# Patient Record
Sex: Female | Born: 1940 | Race: White | Hispanic: No | Smoking: Current every day smoker
Health system: Southern US, Community
[De-identification: ages and names within clinical notes are randomized; demographics above are authoritative.]

## PROBLEM LIST (undated history)

## (undated) DIAGNOSIS — I272 Pulmonary hypertension, unspecified: Secondary | ICD-10-CM

## (undated) DIAGNOSIS — G2581 Restless legs syndrome: Secondary | ICD-10-CM

## (undated) DIAGNOSIS — E871 Hypo-osmolality and hyponatremia: Secondary | ICD-10-CM

## (undated) DIAGNOSIS — I350 Nonrheumatic aortic (valve) stenosis: Secondary | ICD-10-CM

## (undated) DIAGNOSIS — I10 Essential (primary) hypertension: Secondary | ICD-10-CM

## (undated) DIAGNOSIS — A419 Sepsis, unspecified organism: Secondary | ICD-10-CM

## (undated) DIAGNOSIS — R6521 Severe sepsis with septic shock: Secondary | ICD-10-CM

## (undated) DIAGNOSIS — I472 Ventricular tachycardia: Secondary | ICD-10-CM

## (undated) DIAGNOSIS — J449 Chronic obstructive pulmonary disease, unspecified: Secondary | ICD-10-CM

## (undated) DIAGNOSIS — I5043 Acute on chronic combined systolic (congestive) and diastolic (congestive) heart failure: Secondary | ICD-10-CM

## (undated) DIAGNOSIS — R000 Tachycardia, unspecified: Secondary | ICD-10-CM

## (undated) DIAGNOSIS — K5732 Diverticulitis of large intestine without perforation or abscess without bleeding: Secondary | ICD-10-CM

## (undated) DIAGNOSIS — J9690 Respiratory failure, unspecified, unspecified whether with hypoxia or hypercapnia: Secondary | ICD-10-CM

## (undated) DIAGNOSIS — R931 Abnormal findings on diagnostic imaging of heart and coronary circulation: Secondary | ICD-10-CM

## (undated) DIAGNOSIS — K279 Peptic ulcer, site unspecified, unspecified as acute or chronic, without hemorrhage or perforation: Secondary | ICD-10-CM

## (undated) DIAGNOSIS — I447 Left bundle-branch block, unspecified: Secondary | ICD-10-CM

## (undated) DIAGNOSIS — I340 Nonrheumatic mitral (valve) insufficiency: Secondary | ICD-10-CM

## (undated) DIAGNOSIS — Z720 Tobacco use: Secondary | ICD-10-CM

## (undated) DIAGNOSIS — I5032 Chronic diastolic (congestive) heart failure: Secondary | ICD-10-CM

## (undated) DIAGNOSIS — M25559 Pain in unspecified hip: Secondary | ICD-10-CM

## (undated) HISTORY — DX: Restless legs syndrome: G25.81

## (undated) HISTORY — DX: Pain in unspecified hip: M25.559

## (undated) HISTORY — DX: Tobacco use: Z72.0

## (undated) HISTORY — DX: Essential (primary) hypertension: I10

## (undated) HISTORY — PX: TONSILLECTOMY: SHX5217

## (undated) HISTORY — PX: ABDOMINAL HYSTERECTOMY: SHX81

## (undated) HISTORY — PX: DILATION AND CURETTAGE OF UTERUS: SHX78

---

## 1998-08-13 ENCOUNTER — Ambulatory Visit (HOSPITAL_COMMUNITY): Admission: RE | Admit: 1998-08-13 | Discharge: 1998-08-13 | Payer: Self-pay | Admitting: *Deleted

## 1999-07-20 ENCOUNTER — Other Ambulatory Visit: Admission: RE | Admit: 1999-07-20 | Discharge: 1999-07-20 | Payer: Self-pay | Admitting: Internal Medicine

## 1999-10-31 ENCOUNTER — Emergency Department (HOSPITAL_COMMUNITY): Admission: EM | Admit: 1999-10-31 | Discharge: 1999-10-31 | Payer: Self-pay | Admitting: Emergency Medicine

## 1999-10-31 ENCOUNTER — Encounter: Payer: Self-pay | Admitting: Emergency Medicine

## 2000-04-06 ENCOUNTER — Encounter: Payer: Self-pay | Admitting: Internal Medicine

## 2000-04-06 ENCOUNTER — Encounter: Admission: RE | Admit: 2000-04-06 | Discharge: 2000-04-06 | Payer: Self-pay | Admitting: Internal Medicine

## 2002-05-03 ENCOUNTER — Encounter: Payer: Self-pay | Admitting: Internal Medicine

## 2002-05-03 ENCOUNTER — Encounter: Admission: RE | Admit: 2002-05-03 | Discharge: 2002-05-03 | Payer: Self-pay | Admitting: Internal Medicine

## 2004-05-11 ENCOUNTER — Ambulatory Visit: Payer: Self-pay | Admitting: Internal Medicine

## 2004-10-22 ENCOUNTER — Ambulatory Visit: Payer: Self-pay | Admitting: Internal Medicine

## 2004-10-29 ENCOUNTER — Ambulatory Visit: Payer: Self-pay | Admitting: Internal Medicine

## 2004-12-08 ENCOUNTER — Ambulatory Visit: Payer: Self-pay | Admitting: Internal Medicine

## 2005-04-28 ENCOUNTER — Ambulatory Visit: Payer: Self-pay | Admitting: Internal Medicine

## 2005-12-15 ENCOUNTER — Ambulatory Visit: Payer: Self-pay | Admitting: Internal Medicine

## 2005-12-23 ENCOUNTER — Ambulatory Visit: Payer: Self-pay | Admitting: Internal Medicine

## 2007-02-09 DIAGNOSIS — I10 Essential (primary) hypertension: Secondary | ICD-10-CM

## 2007-03-05 ENCOUNTER — Telehealth (INDEPENDENT_AMBULATORY_CARE_PROVIDER_SITE_OTHER): Payer: Self-pay | Admitting: *Deleted

## 2007-03-06 ENCOUNTER — Ambulatory Visit: Payer: Self-pay | Admitting: Internal Medicine

## 2007-03-06 DIAGNOSIS — M25559 Pain in unspecified hip: Secondary | ICD-10-CM

## 2007-03-07 ENCOUNTER — Telehealth: Payer: Self-pay | Admitting: Internal Medicine

## 2007-04-02 ENCOUNTER — Telehealth: Payer: Self-pay | Admitting: Internal Medicine

## 2007-04-03 ENCOUNTER — Ambulatory Visit: Payer: Self-pay | Admitting: Internal Medicine

## 2007-05-21 ENCOUNTER — Ambulatory Visit: Payer: Self-pay | Admitting: Internal Medicine

## 2007-05-21 DIAGNOSIS — R197 Diarrhea, unspecified: Secondary | ICD-10-CM

## 2007-05-25 ENCOUNTER — Telehealth: Payer: Self-pay | Admitting: Internal Medicine

## 2007-05-28 ENCOUNTER — Ambulatory Visit: Payer: Self-pay | Admitting: Internal Medicine

## 2007-05-28 LAB — CONVERTED CEMR LAB
BUN: 4 mg/dL — ABNORMAL LOW (ref 6–23)
Basophils Absolute: 0.1 10*3/uL (ref 0.0–0.1)
Basophils Relative: 0.6 % (ref 0.0–1.0)
CO2: 31 meq/L (ref 19–32)
Calcium: 9.4 mg/dL (ref 8.4–10.5)
Chloride: 90 meq/L — ABNORMAL LOW (ref 96–112)
Creatinine, Ser: 0.7 mg/dL (ref 0.4–1.2)
Eosinophils Absolute: 0 10*3/uL (ref 0.0–0.6)
Eosinophils Relative: 0.4 % (ref 0.0–5.0)
GFR calc Af Amer: 107 mL/min
GFR calc non Af Amer: 88 mL/min
Glucose, Bld: 99 mg/dL (ref 70–99)
HCT: 41.4 % (ref 36.0–46.0)
Hemoglobin: 14.3 g/dL (ref 12.0–15.0)
Lymphocytes Relative: 15.3 % (ref 12.0–46.0)
MCHC: 34.6 g/dL (ref 30.0–36.0)
MCV: 97.5 fL (ref 78.0–100.0)
Monocytes Absolute: 0.5 10*3/uL (ref 0.2–0.7)
Monocytes Relative: 4.4 % (ref 3.0–11.0)
Neutro Abs: 8.4 10*3/uL — ABNORMAL HIGH (ref 1.4–7.7)
Neutrophils Relative %: 79.3 % — ABNORMAL HIGH (ref 43.0–77.0)
Platelets: 615 10*3/uL — ABNORMAL HIGH (ref 150–400)
Potassium: 3.3 meq/L — ABNORMAL LOW (ref 3.5–5.1)
RBC: 4.25 M/uL (ref 3.87–5.11)
RDW: 12.4 % (ref 11.5–14.6)
Sodium: 134 meq/L — ABNORMAL LOW (ref 135–145)
WBC: 10.6 10*3/uL — ABNORMAL HIGH (ref 4.5–10.5)

## 2007-05-31 ENCOUNTER — Telehealth: Payer: Self-pay | Admitting: Internal Medicine

## 2008-07-24 ENCOUNTER — Ambulatory Visit: Payer: Self-pay | Admitting: Internal Medicine

## 2010-01-25 ENCOUNTER — Telehealth: Payer: Self-pay | Admitting: *Deleted

## 2010-02-15 ENCOUNTER — Ambulatory Visit: Payer: Self-pay | Admitting: Internal Medicine

## 2010-02-24 ENCOUNTER — Telehealth: Payer: Self-pay | Admitting: Internal Medicine

## 2010-07-20 NOTE — Assessment & Plan Note (Signed)
Summary: SINUSITIS / BRONCHITIS? // RS   Vital Signs:  Patient profile:   70 year old female Weight:      138 pounds O2 Sat:      89 % on Room air Temp:     98.5 degrees F oral BP sitting:   136 / 80  (right arm) Cuff size:   regular  Vitals Entered By: Duard Brady LPN (February 15, 2010 4:32 PM)  O2 Flow:  Room air CC: c/o head and chest congestion , productive cough , using OTC Is Patient Diabetic? No   CC:  c/o head and chest congestion , productive cough , and using OTC.  History of Present Illness: 70 year old patient has a history of ongoing tobacco use, hypertension, who presents with a two week history of increasing chest congestion, and minimally productive cough.  There's been no fever or chills.  She has been hostile eyes in the remote past due to pneumonia.  Denies much in the way of shortness of breath.  Also describes some head congestion.  Preventive Screening-Counseling & Management  Alcohol-Tobacco     Smoking Cessation Counseling: yes  Allergies: 1)  ! Penicillin 2)  ! Erythromycin 3)  ! Prednisone 4)  ! * Tetanus 5)  ! * Mobic  Past History:  Past Medical History: Reviewed history from 02/09/2007 and no changes required. Hypertension Tobacco abuse Restless leg syndrome  Past Surgical History: Reviewed history from 02/09/2007 and no changes required. Hysterectomy Tonsillectomy D&C  Family History: Reviewed history from 03/06/2007 and no changes required. Family History Other cancer-cervical Fam hx MI  father died age 41 leukemia and mother died age 63 of cervical cancer one brother and one sister positive for a coronary artery disease  Review of Systems       The patient complains of prolonged cough.  The patient denies anorexia, fever, weight loss, weight gain, vision loss, decreased hearing, hoarseness, chest pain, syncope, dyspnea on exertion, peripheral edema, headaches, hemoptysis, abdominal pain, melena, hematochezia, severe  indigestion/heartburn, hematuria, incontinence, genital sores, muscle weakness, suspicious skin lesions, transient blindness, difficulty walking, depression, unusual weight change, abnormal bleeding, enlarged lymph nodes, angioedema, and breast masses.    Physical Exam  General:  Well-developed,well-nourished,in no acute distress; alert,appropriate and cooperative throughout examination; no acute distress.  Blood pressure normal 130/80 Head:  Normocephalic and atraumatic without obvious abnormalities. No apparent alopecia or balding. Eyes:  No corneal or conjunctival inflammation noted. EOMI. Perrla. Funduscopic exam benign, without hemorrhages, exudates or papilledema. Vision grossly normal. Ears:  External ear exam shows no significant lesions or deformities.  Otoscopic examination reveals clear canals, tympanic membranes are intact bilaterally without bulging, retraction, inflammation or discharge. Hearing is grossly normal bilaterally. Mouth:  Oral mucosa and oropharynx without lesions or exudates.  Teeth in good repair. Neck:  No deformities, masses, or tenderness noted. Lungs:  scattered rhonchi O2 saturation 91 to 93%normal respiratory effort, no intercostal retractions, and no accessory muscle use.  normal respiratory effort, no intercostal retractions, and no accessory muscle use.   Heart:  Normal rate and regular rhythm. S1 and S2 normal without gallop, murmur, click, rub or other extra sounds. Abdomen:  Bowel sounds positive,abdomen soft and non-tender without masses, organomegaly or hernias noted. Pulses:  right dorsalis pedis pulse diminished Extremities:  no edema   Impression & Recommendations:  Problem # 1:  BRONCHITIS-ACUTE (ICD-466.0)  Her updated medication list for this problem includes:    Avelox 400 Mg Tabs (Moxifloxacin hcl) ..... One daily  Orders: Depo-  Medrol 80mg  (J1040) Admin of Therapeutic Inj  intramuscular or subcutaneous (14782)  Problem # 2:  HYPERTENSION  (ICD-401.9)  Her updated medication list for this problem includes:    Amlodipine Besylate 5 Mg Tabs (Amlodipine besylate) .Marland Kitchen... 1 once daily    Hyzaar 50-12.5 Mg Tabs (Losartan potassium-hctz) .Marland Kitchen... 1 once daily  Her updated medication list for this problem includes:    Amlodipine Besylate 5 Mg Tabs (Amlodipine besylate) .Marland Kitchen... 1 once daily    Hyzaar 50-12.5 Mg Tabs (Losartan potassium-hctz) .Marland Kitchen... 1 once daily  Complete Medication List: 1)  Amlodipine Besylate 5 Mg Tabs (Amlodipine besylate) .Marland Kitchen.. 1 once daily 2)  Hyzaar 50-12.5 Mg Tabs (Losartan potassium-hctz) .Marland Kitchen.. 1 once daily 3)  Clonazepam Odt 0.5 Mg Tbdp (Clonazepam) .Marland Kitchen.. 1 at bedtime 4)  Avelox 400 Mg Tabs (Moxifloxacin hcl) .... One daily  Patient Instructions: 1)  Tobacco is very bad for your health and your loved ones! You Should stop smoking!. 2)  Mucinex one twice daily 3)  Take your antibiotic as prescribed until ALL of it is gone, but stop if you develop a rash or swelling and contact our office as soon as possible. 4)  Dulera- one puff twice daily  5)  Report to hospital for admission if any clinical worsening 6)  Please schedule a follow-up appointment in 4 days Prescriptions: CLONAZEPAM ODT 0.5 MG  TBDP (CLONAZEPAM) 1 at bedtime  #90 Tablet x 1   Entered and Authorized by:   Gordy Savers  MD   Signed by:   Gordy Savers  MD on 02/15/2010   Method used:   Print then Give to Patient   RxID:   [id number] HYZAAR 50-12.5 MG  TABS (LOSARTAN POTASSIUM-HCTZ) 1 once daily  #90 x 6   Entered and Authorized by:   Gordy Savers  MD   Signed by:   Gordy Savers  MD on 02/15/2010   Method used:   Print then Give to Patient   RxID:   [id number] AMLODIPINE BESYLATE 5 MG  TABS (AMLODIPINE BESYLATE) 1 once daily  #90 x 0   Entered and Authorized by:   Gordy Savers  MD   Signed by:   Gordy Savers  MD on 02/15/2010   Method used:   Print then Give to Patient   RxID:    [id number] AVELOX 400 MG TABS (MOXIFLOXACIN HCL) one daily  #7 x 0   Entered and Authorized by:   Gordy Savers  MD   Signed by:   Gordy Savers  MD on 02/15/2010   Method used:   Print then Give to Patient   RxID:   [id number]    Medication Administration  Injection # 1:    Medication: Depo- Medrol 80mg     Diagnosis: BRONCHITIS-ACUTE (ICD-466.0)    Route: IM    Site: RUOQ gluteus    Exp Date: 09/2012    Lot #: [id number]    Mfr: Pharmacia    Patient tolerated injection without complications    Given by: Duard Brady LPN (February 15, 2010 5:20 PM)  Orders Added: 1)  Est. Patient Level III [16606] 2)  Depo- Medrol 80mg  [J1040] 3)  Admin of Therapeutic Inj  intramuscular or subcutaneous [30160]

## 2010-07-20 NOTE — Progress Notes (Signed)
Summary: refill hyzaar  Phone Note Refill Request Message from:  Fax from Pharmacy on February 24, 2010 12:24 PM  Refills Requested: Medication #1:  HYZAAR 50-12.5 MG  TABS 1 once daily [facility]    Method Requested: Fax to Local Pharmacy Initial call taken by: Duard Brady LPN,  February 24, 2010 12:25 PM    Prescriptions: HYZAAR 50-12.5 MG  TABS (LOSARTAN POTASSIUM-HCTZ) 1 once daily  #90 x 6   Entered by:   Duard Brady LPN   Authorized by:   Gordy Savers  MD   Signed by:   Duard Brady LPN on 81/19/1478   Method used:   Faxed to ...       [facility] (retail)       [address]       [place], [place]  [id number]       Ph: [phone]       Fax: [phone]   RxID:   [id number]

## 2010-07-20 NOTE — Progress Notes (Signed)
Summary: refill  Phone Note From Pharmacy   Caller: [facility]  Reason for Call: Needs renewal Details for Reason: Amlodipine 5mg  Initial call taken by: Romualdo Bolk, CMA Duncan Dull),  January 25, 2010 11:36 AM  Follow-up for Phone Call        Rx sent to pharmacy Follow-up by: Romualdo Bolk, CMA Duncan Dull),  January 25, 2010 11:36 AM    Prescriptions: AMLODIPINE BESYLATE 5 MG  TABS (AMLODIPINE BESYLATE) 1 once daily  #90 x 0   Entered by:   Romualdo Bolk, CMA (AAMA)   Authorized by:   Gordy Savers  MD   Signed by:   Romualdo Bolk, CMA (AAMA) on 01/25/2010   Method used:   Electronically to        [facility] (retail)       [address]       [place], [place]  [address]       Ph: [phone]       Fax: [phone]   RxID:   [id number]

## 2010-07-30 ENCOUNTER — Other Ambulatory Visit: Payer: Self-pay

## 2010-07-30 DIAGNOSIS — I10 Essential (primary) hypertension: Secondary | ICD-10-CM

## 2010-07-30 MED ORDER — AMLODIPINE BESYLATE 5 MG PO TABS: 5.0000 mg | ORAL_TABLET | Freq: Every day | ORAL | Status: DC

## 2010-07-30 NOTE — Telephone Encounter (Signed)
Faxed back to [facility]

## 2010-11-04 ENCOUNTER — Other Ambulatory Visit (INDEPENDENT_AMBULATORY_CARE_PROVIDER_SITE_OTHER): Payer: BC Managed Care – PPO | Admitting: Internal Medicine

## 2010-11-04 ENCOUNTER — Encounter: Payer: Self-pay | Admitting: Internal Medicine

## 2010-11-04 ENCOUNTER — Other Ambulatory Visit: Payer: Self-pay

## 2010-11-04 DIAGNOSIS — Z0000 Encounter for general adult medical examination without abnormal findings: Secondary | ICD-10-CM

## 2010-11-04 LAB — HEPATIC FUNCTION PANEL
ALT: 13 U/L (ref 0–35)
AST: 17 U/L (ref 0–37)
Albumin: 3.6 g/dL (ref 3.5–5.2)
Alkaline Phosphatase: 68 U/L (ref 39–117)
Bilirubin, Direct: 0.1 mg/dL (ref 0.0–0.3)
Total Bilirubin: 0.6 mg/dL (ref 0.3–1.2)
Total Protein: 6.5 g/dL (ref 6.0–8.3)

## 2010-11-04 LAB — CBC WITH DIFFERENTIAL/PLATELET
Basophils Absolute: 0 10*3/uL (ref 0.0–0.1)
Basophils Relative: 0.5 % (ref 0.0–3.0)
Eosinophils Absolute: 0.1 10*3/uL (ref 0.0–0.7)
Eosinophils Relative: 1.1 % (ref 0.0–5.0)
HCT: 45.6 % (ref 36.0–46.0)
Hemoglobin: 15.6 g/dL — ABNORMAL HIGH (ref 12.0–15.0)
Lymphocytes Relative: 27.9 % (ref 12.0–46.0)
Lymphs Abs: 2.1 10*3/uL (ref 0.7–4.0)
MCHC: 34.3 g/dL (ref 30.0–36.0)
MCV: 98.8 fl (ref 78.0–100.0)
Monocytes Absolute: 0.6 10*3/uL (ref 0.1–1.0)
Monocytes Relative: 7.8 % (ref 3.0–12.0)
Neutro Abs: 4.7 10*3/uL (ref 1.4–7.7)
Neutrophils Relative %: 62.7 % (ref 43.0–77.0)
Platelets: 346 10*3/uL (ref 150.0–400.0)
RBC: 4.61 Mil/uL (ref 3.87–5.11)
RDW: 13.9 % (ref 11.5–14.6)
WBC: 7.6 10*3/uL (ref 4.5–10.5)

## 2010-11-04 LAB — LIPID PANEL
Cholesterol: 166 mg/dL (ref 0–200)
HDL: 71.7 mg/dL (ref >39.00)
LDL Cholesterol: 73 mg/dL (ref 0–99)
Total CHOL/HDL Ratio: 2
Triglycerides: 106 mg/dL (ref 0.0–149.0)
VLDL: 21.2 mg/dL (ref 0.0–40.0)

## 2010-11-04 LAB — BASIC METABOLIC PANEL
BUN: 10 mg/dL (ref 6–23)
CO2: 30 mEq/L (ref 19–32)
Calcium: 9.5 mg/dL (ref 8.4–10.5)
Chloride: 95 mEq/L — ABNORMAL LOW (ref 96–112)
Creatinine, Ser: 0.5 mg/dL (ref 0.4–1.2)
GFR: 141.93 mL/min (ref >60.00)
Glucose, Bld: 83 mg/dL (ref 70–99)
Potassium: 4 mEq/L (ref 3.5–5.1)
Sodium: 134 mEq/L — ABNORMAL LOW (ref 135–145)

## 2010-11-04 LAB — POCT URINALYSIS DIPSTICK
Glucose, UA: NEGATIVE
Nitrite, UA: NEGATIVE
Spec Grav, UA: 1.015
Urobilinogen, UA: 0.2

## 2010-11-04 LAB — TSH: TSH: 0.77 u[IU]/mL (ref 0.35–5.50)

## 2010-11-09 ENCOUNTER — Ambulatory Visit (INDEPENDENT_AMBULATORY_CARE_PROVIDER_SITE_OTHER): Payer: BC Managed Care – PPO | Admitting: Internal Medicine

## 2010-11-09 ENCOUNTER — Encounter: Payer: Self-pay | Admitting: Internal Medicine

## 2010-11-09 VITALS — BP 140/90 | HR 88 | Temp 98.1°F | Resp 18 | Ht 62.0 in | Wt 136.0 lb

## 2010-11-09 DIAGNOSIS — I10 Essential (primary) hypertension: Secondary | ICD-10-CM

## 2010-11-09 DIAGNOSIS — R0989 Other specified symptoms and signs involving the circulatory and respiratory systems: Secondary | ICD-10-CM

## 2010-11-09 MED ORDER — AMLODIPINE BESYLATE 5 MG PO TABS: 5.0000 mg | ORAL_TABLET | Freq: Every day | ORAL | Status: DC

## 2010-11-09 MED ORDER — LOSARTAN POTASSIUM-HCTZ 50-12.5 MG PO TABS: 1.0000 | ORAL_TABLET | Freq: Every day | ORAL | Status: DC

## 2010-11-09 MED ORDER — TRAMADOL HCL 50 MG PO TABS: 50.0000 mg | ORAL_TABLET | Freq: Four times a day (QID) | ORAL | Status: DC | PRN

## 2010-11-09 MED ORDER — SERTRALINE HCL 50 MG PO TABS: 50.0000 mg | ORAL_TABLET | Freq: Every day | ORAL | Status: DC

## 2010-11-09 MED ORDER — CLONAZEPAM 0.5 MG PO TABS: 0.5000 mg | ORAL_TABLET | Freq: Every day | ORAL | Status: DC

## 2010-11-09 NOTE — Progress Notes (Signed)
  Subjective:    Patient ID: Jill White, female    DOB: 28-Nov-1940, 70 y.o.   MRN: [id number]  HPI  70 year old patient who is seen today for a wellness exam. She has a history of treated hypertension of long-standing and a history of ongoing tobacco use. She feels well today. She declines screening colonoscopy and also denies gynecologic check. She will obtain a screening mammogram. She does have some arthritic symptoms.  Review of Systems  Constitutional: Negative for fever, appetite change, fatigue and unexpected weight change.  HENT: Negative for hearing loss, ear pain, nosebleeds, congestion, sore throat, mouth sores, trouble swallowing, neck stiffness, dental problem, voice change, sinus pressure and tinnitus.   Eyes: Negative for photophobia, pain, redness and visual disturbance.  Respiratory: Negative for cough, chest tightness and shortness of breath.   Cardiovascular: Negative for chest pain, palpitations and leg swelling.  Gastrointestinal: Negative for nausea, vomiting, abdominal pain, diarrhea, constipation, blood in stool, abdominal distention and rectal pain.  Genitourinary: Negative for dysuria, urgency, frequency, hematuria, flank pain, vaginal bleeding, vaginal discharge, difficulty urinating, genital sores, vaginal pain, menstrual problem and pelvic pain.  Musculoskeletal: Positive for arthralgias. Negative for back pain.  Skin: Negative for rash.  Neurological: Negative for dizziness, syncope, speech difficulty, weakness, light-headedness, numbness and headaches.  Hematological: Negative for adenopathy. Does not bruise/bleed easily.  Psychiatric/Behavioral: Negative for suicidal ideas, behavioral problems, self-injury, dysphoric mood and agitation. The patient is not nervous/anxious.        Objective:   Physical Exam  Constitutional: She is oriented to person, place, and time. She appears well-developed and well-nourished.       134/70  HENT:  Head: Normocephalic and  atraumatic.  Right Ear: External ear normal.  Left Ear: External ear normal.  Mouth/Throat: Oropharynx is clear and moist.  Eyes: Conjunctivae and EOM are normal.  Neck: Normal range of motion. Neck supple. No JVD present. No thyromegaly present.       Left supraclavicular bruit  Cardiovascular: Normal rate, regular rhythm, normal heart sounds and intact distal pulses.   No murmur heard. Pulmonary/Chest: Effort normal and breath sounds normal. She has no wheezes. She has no rales.  Abdominal: Soft. Bowel sounds are normal. She exhibits no distension and no mass. There is no tenderness. There is no rebound and no guarding.  Musculoskeletal: Normal range of motion. She exhibits no edema and no tenderness.  Neurological: She is alert and oriented to person, place, and time. She has normal reflexes. No cranial nerve deficit. She exhibits normal muscle tone. Coordination normal.  Skin: Skin is warm and dry. No rash noted.  Psychiatric: She has a normal mood and affect. Her behavior is normal.          Assessment & Plan:   Annual health exam Hypertension well controlled Osteoarthritis Ongoing tobacco use  Mammogram colonoscopy bone density study all discussed and encouraged Tobacco cessation encouraged Aspirin use encouraged We'll consider a carotid artery Doppler study Recheck in 6 months

## 2010-11-09 NOTE — Patient Instructions (Addendum)
Schedule your colonoscopy to help detect colon cancer.  Take 81 mg of aspirin daily    It is important that you exercise regularly, at least 20 minutes 3 to 4 times per week.  If you develop chest pain or shortness of breath seek  medical attention.  Smoking tobacco is very bad for your health. You should stop smoking immediately.  Schedule your mammogram.  Return in 6 months for follow-up  Take a calcium supplement, plus [phone] units of vitamin D

## 2010-11-18 ENCOUNTER — Other Ambulatory Visit: Payer: Self-pay | Admitting: Cardiology

## 2010-11-18 DIAGNOSIS — I6529 Occlusion and stenosis of unspecified carotid artery: Secondary | ICD-10-CM

## 2010-11-19 ENCOUNTER — Ambulatory Visit (INDEPENDENT_AMBULATORY_CARE_PROVIDER_SITE_OTHER): Payer: BC Managed Care – PPO | Admitting: Cardiology

## 2010-11-19 DIAGNOSIS — I6529 Occlusion and stenosis of unspecified carotid artery: Secondary | ICD-10-CM

## 2010-11-19 DIAGNOSIS — R0989 Other specified symptoms and signs involving the circulatory and respiratory systems: Secondary | ICD-10-CM

## 2010-11-24 ENCOUNTER — Encounter: Payer: Self-pay | Admitting: Internal Medicine

## 2011-02-28 ENCOUNTER — Telehealth: Payer: Self-pay | Admitting: *Deleted

## 2011-02-28 NOTE — Telephone Encounter (Signed)
[facility] Triage Call Report Triage Record Num: [id number] Operator: Meribeth Mattes Patient Name: Jill White Call Date & Time: 02/26/2011 8:14:01AM Patient Phone: [phone] PCP: Gordy Savers Patient Gender: Female PCP Fax : [phone] Patient DOB: 1940/10/07 [facility] Name: [facility] Reason for Call: Pt had nose bleed 02/22/11 x 2 times, stopped with pressure, oozed blood on 02/23/11, had another nose bleed on 02/24/11, and another one today 02/26/11 All emergent sx for Nosebleed Protocol R/o. May be seen at [facility]. Pt rathers be seen at [facility]. F/u with office on Monday. Protocol(s) Used: Nosebleed Recommended Outcome per Protocol: See Provider within 72 Hours Reason for Outcome: Recurrent bleeding (3 or more episodes in last week) that stops with 10 minutes of direct pressure or stops spontaneously Care Advice: ~ 02/26/2011 8:22:23AM Page 1 of 1 [email or web address]

## 2011-05-14 ENCOUNTER — Other Ambulatory Visit: Payer: Self-pay | Admitting: Internal Medicine

## 2011-05-17 ENCOUNTER — Other Ambulatory Visit: Payer: Self-pay

## 2011-05-17 MED ORDER — AMLODIPINE BESYLATE 5 MG PO TABS: 5.0000 mg | ORAL_TABLET | Freq: Every day | ORAL | Status: DC

## 2011-05-20 ENCOUNTER — Telehealth: Payer: Self-pay | Admitting: Internal Medicine

## 2011-05-20 NOTE — Telephone Encounter (Signed)
Pt requesting to have a rx for shingles shot be Faxed to [facility]. Pt a;so interested in getting a pneumonia shot does as well would she need a prescription sent in for this as well?  Pt requesting you contact her when they are faxed to pharmacy

## 2011-05-20 NOTE — Telephone Encounter (Signed)
Faxed. Pt aware

## 2011-07-06 ENCOUNTER — Ambulatory Visit (INDEPENDENT_AMBULATORY_CARE_PROVIDER_SITE_OTHER): Payer: Medicare Other | Admitting: Family Medicine

## 2011-07-06 ENCOUNTER — Emergency Department (HOSPITAL_COMMUNITY): Payer: Medicare Other

## 2011-07-06 ENCOUNTER — Other Ambulatory Visit: Payer: Self-pay

## 2011-07-06 ENCOUNTER — Encounter: Payer: Self-pay | Admitting: Family Medicine

## 2011-07-06 ENCOUNTER — Inpatient Hospital Stay (HOSPITAL_COMMUNITY)
Admission: EM | Admit: 2011-07-06 | Discharge: 2011-07-12 | DRG: 384 | Disposition: A | Discharge status: Home / Self Care | Payer: Medicare Other | Attending: Internal Medicine | Admitting: Internal Medicine

## 2011-07-06 ENCOUNTER — Encounter (HOSPITAL_COMMUNITY): Payer: Self-pay | Admitting: Emergency Medicine

## 2011-07-06 VITALS — BP 110/80 | HR 92 | Temp 98.8°F | Wt 130.0 lb

## 2011-07-06 DIAGNOSIS — J449 Chronic obstructive pulmonary disease, unspecified: Secondary | ICD-10-CM

## 2011-07-06 DIAGNOSIS — F17200 Nicotine dependence, unspecified, uncomplicated: Secondary | ICD-10-CM | POA: Diagnosis present

## 2011-07-06 DIAGNOSIS — Z23 Encounter for immunization: Secondary | ICD-10-CM

## 2011-07-06 DIAGNOSIS — K5732 Diverticulitis of large intestine without perforation or abscess without bleeding: Secondary | ICD-10-CM | POA: Diagnosis present

## 2011-07-06 DIAGNOSIS — K259 Gastric ulcer, unspecified as acute or chronic, without hemorrhage or perforation: Secondary | ICD-10-CM | POA: Diagnosis present

## 2011-07-06 DIAGNOSIS — Z79899 Other long term (current) drug therapy: Secondary | ICD-10-CM

## 2011-07-06 DIAGNOSIS — I10 Essential (primary) hypertension: Secondary | ICD-10-CM | POA: Diagnosis present

## 2011-07-06 DIAGNOSIS — G2581 Restless legs syndrome: Secondary | ICD-10-CM

## 2011-07-06 DIAGNOSIS — K269 Duodenal ulcer, unspecified as acute or chronic, without hemorrhage or perforation: Principal | ICD-10-CM | POA: Diagnosis present

## 2011-07-06 DIAGNOSIS — E871 Hypo-osmolality and hyponatremia: Secondary | ICD-10-CM | POA: Diagnosis present

## 2011-07-06 DIAGNOSIS — J4489 Other specified chronic obstructive pulmonary disease: Secondary | ICD-10-CM | POA: Diagnosis present

## 2011-07-06 DIAGNOSIS — D1803 Hemangioma of intra-abdominal structures: Secondary | ICD-10-CM | POA: Diagnosis present

## 2011-07-06 DIAGNOSIS — R1011 Right upper quadrant pain: Secondary | ICD-10-CM

## 2011-07-06 DIAGNOSIS — F17210 Nicotine dependence, cigarettes, uncomplicated: Secondary | ICD-10-CM

## 2011-07-06 DIAGNOSIS — Z7982 Long term (current) use of aspirin: Secondary | ICD-10-CM

## 2011-07-06 DIAGNOSIS — K769 Liver disease, unspecified: Secondary | ICD-10-CM | POA: Diagnosis present

## 2011-07-06 DIAGNOSIS — R109 Unspecified abdominal pain: Secondary | ICD-10-CM | POA: Diagnosis present

## 2011-07-06 HISTORY — DX: Chronic obstructive pulmonary disease, unspecified: J44.9

## 2011-07-06 LAB — CBC
HCT: 42 % (ref 36.0–46.0)
Hemoglobin: 14.8 g/dL (ref 12.0–15.0)
MCH: 32.8 pg (ref 26.0–34.0)
MCHC: 35.2 g/dL (ref 30.0–36.0)
MCV: 93.1 fL (ref 78.0–100.0)
Platelets: 384 10*3/uL (ref 150–400)
RBC: 4.51 MIL/uL (ref 3.87–5.11)
RDW: 12.2 % (ref 11.5–15.5)
WBC: 8.6 10*3/uL (ref 4.0–10.5)

## 2011-07-06 LAB — COMPREHENSIVE METABOLIC PANEL
ALT: 15 U/L (ref 0–35)
AST: 27 U/L (ref 0–37)
Albumin: 3.9 g/dL (ref 3.5–5.2)
Alkaline Phosphatase: 80 U/L (ref 39–117)
BUN: 7 mg/dL (ref 6–23)
CO2: 26 mEq/L (ref 19–32)
Calcium: 9.8 mg/dL (ref 8.4–10.5)
Chloride: 88 mEq/L — ABNORMAL LOW (ref 96–112)
Creatinine, Ser: 0.42 mg/dL — ABNORMAL LOW (ref 0.50–1.10)
GFR calc Af Amer: >90 mL/min (ref >90)
GFR calc non Af Amer: >90 mL/min (ref >90)
Glucose, Bld: 105 mg/dL — ABNORMAL HIGH (ref 70–99)
Potassium: 4.4 mEq/L (ref 3.5–5.1)
Sodium: 126 mEq/L — ABNORMAL LOW (ref 135–145)
Total Bilirubin: 0.5 mg/dL (ref 0.3–1.2)
Total Protein: 7.6 g/dL (ref 6.0–8.3)

## 2011-07-06 LAB — URINALYSIS, ROUTINE W REFLEX MICROSCOPIC
Bilirubin Urine: NEGATIVE
Glucose, UA: NEGATIVE mg/dL
Hgb urine dipstick: NEGATIVE
Ketones, ur: NEGATIVE mg/dL
Leukocytes, UA: NEGATIVE
Nitrite: NEGATIVE
Protein, ur: NEGATIVE mg/dL
Specific Gravity, Urine: 1.011 (ref 1.005–1.030)
Urobilinogen, UA: 0.2 mg/dL (ref 0.0–1.0)
pH: 7.5 (ref 5.0–8.0)

## 2011-07-06 LAB — TROPONIN I: Troponin I: <0.3 ng/mL (ref <0.30)

## 2011-07-06 MED ORDER — SODIUM CHLORIDE 0.9 % IV SOLN
INTRAVENOUS | Status: AC
  Administered 2011-07-06: 22:00:00 via INTRAVENOUS

## 2011-07-06 MED ORDER — SODIUM CHLORIDE 0.9 % IV BOLUS (SEPSIS)
1000.0000 mL | Freq: Once | INTRAVENOUS | Status: AC
  Administered 2011-07-06: 1000 mL via INTRAVENOUS

## 2011-07-06 MED ORDER — ONDANSETRON HCL 4 MG/2ML IJ SOLN
4.0000 mg | Freq: Once | INTRAMUSCULAR | Status: AC
  Administered 2011-07-06: 4 mg via INTRAVENOUS
  Filled 2011-07-06: qty 2

## 2011-07-06 MED ORDER — MORPHINE SULFATE 4 MG/ML IJ SOLN
4.0000 mg | Freq: Once | INTRAMUSCULAR | Status: AC
  Administered 2011-07-06: 4 mg via INTRAVENOUS
  Filled 2011-07-06: qty 1

## 2011-07-06 MED ORDER — DIPHENHYDRAMINE HCL 50 MG/ML IJ SOLN
INTRAMUSCULAR | Status: AC
  Filled 2011-07-06: qty 1

## 2011-07-06 MED ORDER — IOHEXOL 300 MG/ML  SOLN
100.0000 mL | Freq: Once | INTRAMUSCULAR | Status: AC | PRN
  Administered 2011-07-06: 80 mL via INTRAVENOUS

## 2011-07-06 MED ORDER — ONDANSETRON HCL 4 MG/2ML IJ SOLN: 4.0000 mg | Freq: Three times a day (TID) | INTRAMUSCULAR | Status: AC | PRN

## 2011-07-06 MED ORDER — DIPHENHYDRAMINE HCL 50 MG/ML IJ SOLN
25.0000 mg | Freq: Once | INTRAMUSCULAR | Status: AC
  Administered 2011-07-06: 25 mg via INTRAVENOUS

## 2011-07-06 MED ORDER — HYDROMORPHONE HCL PF 1 MG/ML IJ SOLN
1.0000 mg | Freq: Once | INTRAMUSCULAR | Status: AC
  Administered 2011-07-06: 1 mg via INTRAVENOUS
  Filled 2011-07-06: qty 1

## 2011-07-06 NOTE — ED Provider Notes (Signed)
History    70yF with abdominal pain. Gradual onset around 1800 yesterday. Persistent since. Achy with occasional sharper pain. RUQ with radiation into back. Did notice was worse eralier today shortly after drinking some broth. No hx of similar. No fever or chills. "I'm not nauseated, just a little queasy." No urinary complaints. Hx of hysterectomy years ago. No recent procedures. Denies hx of GB problems or kidney stones. Seen by PCP who referred to ED for further evaluation.   CSN: [id number]  Arrival date & time 07/06/11  1733   First MD Initiated Contact with Patient 07/06/11 1807      Chief Complaint  Patient presents with  . Abdominal Pain    (Consider location/radiation/quality/duration/timing/severity/associated sxs/prior treatment) HPI  Past Medical History  Diagnosis Date  . HIP PAIN 03/06/2007  . HYPERTENSION 02/09/2007  . Restless leg syndrome   . Tobacco abuse     Past Surgical History  Procedure Date  . Abdominal hysterectomy   . Dilation and curettage of uterus   . Tonsillectomy     Family History  Problem Relation Age of Onset  . Heart disease Sister   . Heart disease Brother     History  Substance Use Topics  . Smoking status: Current Everyday Smoker -- 0.5 packs/day    Types: Cigarettes  . Smokeless tobacco: Never Used  . Alcohol Use: 0.5 oz/week    1 drink(s) per week    OB History    Grav Para Term Preterm Abortions TAB SAB Ect Mult Living                  Review of Systems   Review of symptoms negative unless otherwise noted in HPI.   Allergies  Tetanus toxoid; Codeine; Erythromycin; Meloxicam; Penicillins; and Prednisone  Home Medications   Current Outpatient Rx  Name Route Sig Dispense Refill  . AMLODIPINE BESYLATE 5 MG PO TABS Oral Take 1 tablet (5 mg total) by mouth daily. 90 tablet 1  . ASPIRIN 81 MG PO TABS Oral Take 81 mg by mouth daily.      Marland Kitchen CALCIUM CARBONATE-VITAMIN D 500-200 MG-UNIT PO TABS Oral Take 1 tablet by mouth  daily.    Marland Kitchen CLONAZEPAM 0.5 MG PO TABS Oral Take 1 tablet (0.5 mg total) by mouth at bedtime. 90 tablet 4  . DIPHENHYDRAMINE HCL 12.5 MG/5ML PO ELIX Oral Take 50 mg by mouth 4 (four) times daily as needed. For itching    . LOSARTAN POTASSIUM-HCTZ 50-12.5 MG PO TABS Oral Take 1 tablet by mouth daily. 90 tablet 6  . SERTRALINE HCL 50 MG PO TABS Oral Take 1 tablet (50 mg total) by mouth daily. 30 tablet 11  . TRAMADOL HCL 50 MG PO TABS Oral Take 50 mg by mouth every 6 (six) hours as needed. For pain. Take with benadryl for itching.    Marland Kitchen VITAMIN E 100 UNITS PO CAPS Oral Take 100 Units by mouth daily.      BP 152/59  Pulse 62  Temp(Src) 98.3 F (36.8 C) (Oral)  Resp 18  SpO2 98%  Physical Exam  Nursing note and vitals reviewed. Constitutional: She appears well-developed and well-nourished.       Laying in bed. nad.  HENT:  Head: Normocephalic and atraumatic.  Eyes: Conjunctivae are normal. Right eye exhibits no discharge. Left eye exhibits no discharge.  Neck: Normal range of motion. Neck supple.  Cardiovascular: Normal rate, regular rhythm and normal heart sounds.  Exam reveals no gallop and no friction  rub.   No murmur heard. Pulmonary/Chest: Effort normal and breath sounds normal. No respiratory distress.  Abdominal: Soft. She exhibits no distension. There is tenderness.       Epigastric and RUQ tenderness with guarding. No rebound. No distension.  Genitourinary:       No cva tenderness  Musculoskeletal: She exhibits no edema and no tenderness.  Neurological: She is alert.  Skin: Skin is warm and dry.  Psychiatric: She has a normal mood and affect. Her behavior is normal. Thought content normal.    ED Course  Procedures (including critical care time)  Labs Reviewed  URINALYSIS, ROUTINE W REFLEX MICROSCOPIC - Abnormal; Notable for the following:    APPearance CLOUDY (*)    All other components within normal limits  COMPREHENSIVE METABOLIC PANEL - Abnormal; Notable for the  following:    Sodium 126 (*)    Chloride 88 (*)    Glucose, Bld 105 (*)    Creatinine, Ser 0.42 (*)    All other components within normal limits  CBC   US Abdomen Complete  07/06/2011  *RADIOLOGY REPORT*  Clinical Data:  Right upper quadrant pain for 24 hours after a meal of salmon pattties.  COMPLETE ABDOMINAL ULTRASOUND  Comparison:  None.  Findings:  Gallbladder:  A few small polyps are present but there are no stones seen.  Gallbladder wall thickness is normal measuring 1.8 mm.  There is no pericholecystic fluid.  The patient has generalized mid to right-sided abdominal pain but evaluation for sonographic Murphy's sign was limited due to patient sedation.  Common bile duct:  2.8 mm, within normal limits.  Liver:  No focal lesion identified.  Within normal limits in parenchymal echogenicity.  IVC:  Appears normal.  Pancreas:  No focal abnormality is seen, however the pancreatic duct is mildly enlarged measuring 3.4 mm.  Significance is uncertain; this could represent chronic pancreatitis or obstructing mass.  Spleen:  Within normal limits measuring 7.8 cm  Right Kidney:  Echogenic without masses or hydronephrosis measuring 12.0 cm  Left Kidney:  Echogenic without masses or hydronephrosis measuring 12.5 cm  Abdominal aorta:  Slightly enlarged with a maximal diameter of 2.5 cm.  IMPRESSION: No visible gallstones or pericholecystic fluid.  No biliary ductal dilatation, but pancreatic duct mildly enlarged. This could represent an obstructing lesion, or be a manifestation of chronic inflammation.  Echogenic kidneys without obstruction or mass.  Original Report Authenticated By: Elsie Stain, M.D.   EKG:  Rhythm: normal sinus Rate: 62 Axis: normal Intervals: 1st degree AV block ST segments: NS ST changes   9:51 PM Stopped by pt's family in hall. Apparently pt has been complaining of arm pain intermittently for past month. Given also epigastric pain will check ekg and trop for possible atypical  ACS. Initially deferred because of such reproducibility of pain on exam.  1. Abdominal pain       MDM  70yF with abdominal pain. Consider biliary/renal colic, PUD, gastritis, pancreatitis, AAA, atypical ACS. RUQ which did not reveal explanation for pain. Will CT to further eval. Incidental hyponatremia. Lowest sodium 134, but only to values for comparison. Pt may chronically be hypoNA. Pt on arb/hctz combination which may be contributing but says has been on for years. Clinically euvolemic. Cr normal. No AMS or seizure but feel sodium of this level needs further eval. Discussed with hospitalist with CT pending. Admission.      Raeford Razor, MD 07/08/11 [phone]

## 2011-07-06 NOTE — ED Notes (Signed)
Pt states that md fry office sent her here to evaluate her gallbladder for pain to ruq that radates to her back, no n/v ,

## 2011-07-06 NOTE — ED Notes (Signed)
Patient transported to CT 

## 2011-07-06 NOTE — ED Notes (Signed)
Pt back from [facility] Family at bedside.

## 2011-07-06 NOTE — H&P (Addendum)
PCP:   Rogelia Boga, MD, MD   Chief Complaint:  Abdominal pain  HPI: This is a 71 year old female who states she developed right upper quadrant pain yesterday at 6:30 PM. She describes it as sharp, and constant. Ranked it as 8/10. Reports nausea but no vomiting, no fevers, no chills. She states the pain radiated to the back then down the left. She has no personal history of gallbladder disease but states old woman her family have had gallbladder removed. She was concerned and came to the ER. In the ER the patient received Dilaudid and morphine. She is currently itching quite a bit and requested Benadryl, however she states her abdominal pain is improved. History provided by patient. Her 2 daughters who are both her power of attorney are at her bedside..  Review of Systems:  anorexia, fever, weight loss,, vision loss, decreased hearing, hoarseness, chest pain, syncope, dyspnea on exertion, peripheral edema, balance deficits, hemoptysis, abdominal pain, melena, hematochezia, severe indigestion/heartburn, hematuria, incontinence, genital sores, muscle weakness, suspicious skin lesions, transient blindness, difficulty walking, depression, unusual weight change, abnormal bleeding, enlarged lymph nodes, angioedema, and breast masses.  Past Medical History: Past Medical History  Diagnosis Date  . HIP PAIN 03/06/2007  . HYPERTENSION 02/09/2007  . Restless leg syndrome   . COPD (chronic obstructive pulmonary disease)    Past Surgical History  Procedure Date  . Abdominal hysterectomy   . Dilation and curettage of uterus   . Tonsillectomy     Medications: Prior to Admission medications   Medication Sig Start Date End Date Taking? Authorizing Provider  amLODipine (NORVASC) 5 MG tablet Take 1 tablet (5 mg total) by mouth daily. 05/17/11  Yes Rogelia Boga, MD  aspirin 81 MG tablet Take 81 mg by mouth daily.     Yes Historical Provider, MD  calcium-vitamin D (OSCAL WITH D)  500-200 MG-UNIT per tablet Take 1 tablet by mouth daily.   Yes Historical Provider, MD  clonazePAM (KLONOPIN) 0.5 MG tablet Take 1 tablet (0.5 mg total) by mouth at bedtime. 11/09/10  Yes Rogelia Boga, MD  diphenhydrAMINE (BENADRYL) 12.5 MG/5ML elixir Take 50 mg by mouth 4 (four) times daily as needed. For itching   Yes Historical Provider, MD  losartan-hydrochlorothiazide (HYZAAR) 50-12.5 MG per tablet Take 1 tablet by mouth daily. 11/09/10  Yes Rogelia Boga, MD  sertraline (ZOLOFT) 50 MG tablet Take 1 tablet (50 mg total) by mouth daily. 11/09/10 11/09/11 Yes Rogelia Boga, MD  traMADol (ULTRAM) 50 MG tablet Take 50 mg by mouth every 6 (six) hours as needed. For pain. Take with benadryl for itching. 11/09/10 11/09/11 Yes Rogelia Boga, MD  vitamin E 100 UNIT capsule Take 100 Units by mouth daily.   Yes Historical Provider, MD    Allergies:   Allergies  Allergen Reactions  . Tetanus Toxoid Anaphylaxis  . Codeine Nausea And Vomiting and Other (See Comments)    Itching and faint   . Erythromycin Nausea And Vomiting  . Meloxicam Itching  . Penicillins Hives  . Prednisone Other (See Comments)    GI bleed    Social History:  reports that she has been smoking Cigarettes.  She has been smoking about .5 packs per day. She has never used smokeless tobacco. She reports that she drinks about .5 ounces of alcohol per week. She reports that she does not use illicit drugs.  Family History: Family History  Problem Relation Age of Onset  . Heart disease Sister   . Heart  disease Brother     Physical Exam: Filed Vitals:   07/06/11 1815 07/06/11 1913 07/06/11 1943 07/06/11 2211  BP: 171/56 164/70 152/59 148/53  Pulse: 73 75 62 60  Temp: 98.4 F (36.9 C)  98.3 F (36.8 C) 97.2 F (36.2 C)  TempSrc: Oral  Oral Oral  Resp:   18 16  SpO2: 97% 91% 98% 98%    General:  Alert and oriented times three, well developed and nourished, no acute distress Eyes:  PERRLA, pink conjunctiva, no scleral icterus ENT: Moist oral mucosa, neck supple, no thyromegaly Lungs: Wheezing anteriorly, no crackles, no use of accessory muscles Cardiovascular: regular rate and rhythm, no regurgitation, no gallops, no murmurs. No carotid bruits, no JVD Abdomen: soft, positive BS, mild nonspecific tenderness to palpation, non-distended, no organomegaly, not an acute abdomen GU: not examined Neuro: CN II - XII grossly intact, sensation intact Musculoskeletal: strength 5/5 all extremities, no clubbing, cyanosis or edema Skin: no rash, no subcutaneous crepitation, no decubitus Psych: appropriate patient   Labs on Admission:   [facility] 07/06/11 1918  NA 126*  K 4.4  CL 88*  CO2 26  GLUCOSE 105*  BUN 7  CREATININE 0.42*  CALCIUM 9.8  MG --  PHOS --    Basename 07/06/11 1918  AST 27  ALT 15  ALKPHOS 80  BILITOT 0.5  PROT 7.6  ALBUMIN 3.9   No results found for this basename: LIPASE:2,AMYLASE:2 in the last 72 hours  Basename 07/06/11 1918  WBC 8.6  NEUTROABS --  HGB 14.8  HCT 42.0  MCV 93.1  PLT 384    Basename 07/06/11 2302  CKTOTAL --  CKMB --  CKMBINDEX --  TROPONINI <0.30   No components found with this basename: POCBNP:3 No results found for this basename: DDIMER:2 in the last 72 hours No results found for this basename: HGBA1C:2 in the last 72 hours No results found for this basename: CHOL:2,HDL:2,LDLCALC:2,TRIG:2,CHOLHDL:2,LDLDIRECT:2 in the last 72 hours No results found for this basename: TSH,T4TOTAL,FREET3,T3FREE,THYROIDAB in the last 72 hours No results found for this basename: VITAMINB12:2,FOLATE:2,FERRITIN:2,TIBC:2,IRON:2,RETICCTPCT:2 in the last 72 hours  Micro Results: No results found for this or any previous visit (from the past 240 hour(s)). Results for COXIyah, Laguna (MRN [id number]) as of 07/06/2011 23:46  Ref. Range 07/06/2011 19:57  Color, Urine Latest Range: YELLOW  YELLOW  APPearance Latest Range: CLEAR  CLOUDY (A)    Specific Gravity, Urine Latest Range: 1.005-1.030  1.011  pH Latest Range: 5.0-8.0  7.5  Glucose, UA Latest Range: NEGATIVE mg/dL NEGATIVE  Bilirubin Urine Latest Range: NEGATIVE  NEGATIVE  Ketones, ur Latest Range: NEGATIVE mg/dL NEGATIVE  Protein Latest Range: NEGATIVE mg/dL NEGATIVE  Urobilinogen, UA Latest Range: 0.0-1.0 mg/dL 0.2  Nitrite Latest Range: NEGATIVE  NEGATIVE  Leukocytes, UA Latest Range: NEGATIVE  NEGATIVE    Radiological Exams on Admission: US Abdomen Complete  07/06/2011  *RADIOLOGY REPORT*  Clinical Data:  Right upper quadrant pain for 24 hours after a meal of salmon pattties.  COMPLETE ABDOMINAL ULTRASOUND  Comparison:  None.  Findings:  Gallbladder:  A few small polyps are present but there are no stones seen.  Gallbladder wall thickness is normal measuring 1.8 mm.  There is no pericholecystic fluid.  The patient has generalized mid to right-sided abdominal pain but evaluation for sonographic Murphy's sign was limited due to patient sedation.  Common bile duct:  2.8 mm, within normal limits.  Liver:  No focal lesion identified.  Within normal limits in parenchymal echogenicity.  IVC:  Appears normal.  Pancreas:  No focal abnormality is seen, however the pancreatic duct is mildly enlarged measuring 3.4 mm.  Significance is uncertain; this could represent chronic pancreatitis or obstructing mass.  Spleen:  Within normal limits measuring 7.8 cm  Right Kidney:  Echogenic without masses or hydronephrosis measuring 12.0 cm  Left Kidney:  Echogenic without masses or hydronephrosis measuring 12.5 cm  Abdominal aorta:  Slightly enlarged with a maximal diameter of 2.5 cm.  IMPRESSION: No visible gallstones or pericholecystic fluid.  No biliary ductal dilatation, but pancreatic duct mildly enlarged. This could represent an obstructing lesion, or be a manifestation of chronic inflammation.  Echogenic kidneys without obstruction or mass.  Original Report Authenticated By: Elsie Stain,  M.D.     CT ABDOMEN AND PELVIS WITH CONTRAST  Technique: Multidetector CT imaging of the abdomen and pelvis was  performed following the standard protocol during bolus  administration of intravenous contrast.  Contrast: 80mL OMNIPAQUE IOHEXOL 300 MG/ML IV SOLN  Comparison: Abdominal ultrasound performed earlier today at 08:12  p.m.  Findings: Mild focal right basilar atelectasis is noted.  There is a nonspecific 1.4 cm focus of increased attenuation at the  hepatic dome. A more vague 4.5 cm focus of blush is seen at the  inferior tip of the liver. Though these may reflect an angioma,  malignancy cannot be excluded. A 1.1 cm cystic focus is noted  within the right hepatic lobe. The spleen is unremarkable in  appearance. The pancreas and adrenal glands are unremarkable. The  gallbladder is grossly unremarkable in appearance.  A tiny nonobstructing 2 mm stone is noted at the interpole region  of the left kidney. There is incomplete rotation of the right  kidney. A tiny 5 mm cyst is noted within the interpole region of  the right kidney on delayed images. The kidneys are otherwise  unremarkable in appearance. There is no evidence of  hydronephrosis. No obstructing ureteral stones are seen. No  perinephric stranding is appreciated.  No free fluid is identified. The small bowel is unremarkable in  appearance. The stomach is filled with contrast and is within  normal limits. No acute vascular abnormalities are seen. Diffuse  calcification is noted along the abdominal aorta and its branches.  The cecum is flipped into the upper abdomen. The distal ileum and  appendix run alongside each other, extending into the lateral right  mid abdomen. There is no evidence for appendicitis.  Diffuse diverticulosis is noted along the descending and sigmoid  colon; there is mild apparent mucosal thickening noted along the  sigmoid colon, raising question for a mild infectious process  versus mild chronic  inflammation. The colon is otherwise  unremarkable in appearance.  The bladder is moderately distended and grossly unremarkable in  appearance. The patient is status post hysterectomy; no suspicious  adnexal masses are seen. No inguinal lymphadenopathy is seen.  No acute osseous abnormalities are identified. Diffuse vacuum  phenomenon and disc space narrowing are noted along the lumbar  spine, with associated facet disease. Endplate sclerotic changes  are seen.  IMPRESSION:  1. Mucosal thickening noted along the sigmoid colon, in a region  of diffuse diverticulosis, raising question for a mild infectious  process versus mild chronic inflammation. Diverticulosis involves  the descending and sigmoid colon, without evidence of frank  diverticulitis.  2. Vague 4.5 cm focus of blush at the inferior tip of the liver.  Additional 1.4 cm focus of increased attenuation at the hepatic  dome.  Though these could reflect hemangiomata, malignancy cannot  be excluded. Would correlate with LFTs and consider dynamic liver  protocol CT or MRI for further evaluation.  3. Small hepatic and renal cysts seen.  4. Tiny nonobstructing 2 mm stone at the interpole region of the  left kidney.  5. Diffuse calcification along the abdominal aorta and its  branches.  6. Mild focal right basilar atelectasis noted.  7. Diffuse degenerative change along the lumbar spine  Assessment/Plan Present on Admission:  .Hyponatremia Admit to MedSurg Urine sodium and osmolarity, plasma osmolarity and TSH ordered, Gentle IV fluids   abdominal pain/diverticulitis Ciprofloxacin and Flagyl ordered Pain medications as needed,  n.p.o. Differ need for MRI to a.m. team  .HYPERTENSION Stable resume home medications COPD Tobacco abuse Nicotine patch, nebulizers and oxygen as needed   Full code DVT prophylaxis Team 3/Dr. Severiano Gilbert, Shamiya Demeritt 07/06/2011, 11:43 PM

## 2011-07-06 NOTE — ED Notes (Signed)
Rainbow drawn. Blood in lab for admission orders. Gold, red, blue, lt green, lav

## 2011-07-06 NOTE — Progress Notes (Signed)
  Subjective:    Patient ID: Jill White, female    DOB: [birth year], 71 y.o.   MRN: [id number]  HPI Here with her daughter for 24 hours of intermittent RUQ abdominal pain. This started yesterday after she ate several salmon patties. Today she has attempted to drink beef bullion or eat some crackers, and each time the pain became worse. No fever. No nausea or vomiting. The pain radiates around the right flank to the right middle back. No change in BMs or urinations.    Review of Systems  Constitutional: Negative.   Respiratory: Negative.   Cardiovascular: Negative.   Gastrointestinal: Positive for abdominal pain. Negative for nausea, vomiting, diarrhea, constipation, blood in stool, abdominal distention and anal bleeding.  Genitourinary: Positive for flank pain. Negative for dysuria, urgency, frequency, hematuria, difficulty urinating and pelvic pain.       Objective:   Physical Exam  Constitutional:       In pain. It hurts her to walk or take a deep breath   Cardiovascular: Normal rate, regular rhythm, normal heart sounds and intact distal pulses.  Exam reveals no gallop and no friction rub.   No murmur heard. Pulmonary/Chest: Effort normal and breath sounds normal. No respiratory distress. She has no wheezes. She has no rales. She exhibits no tenderness.  Abdominal: Soft. Bowel sounds are normal. She exhibits no distension and no mass. There is tenderness. There is rebound. There is no guarding.       Tender in the RUQ          Assessment & Plan:  This is most likely gall bladder pain. Her daughter will drive her to [facility] ER now for further evaluation.

## 2011-07-06 NOTE — ED Notes (Signed)
Patient transported to Ultrasound 

## 2011-07-07 ENCOUNTER — Inpatient Hospital Stay (HOSPITAL_COMMUNITY): Payer: Medicare Other

## 2011-07-07 ENCOUNTER — Other Ambulatory Visit (HOSPITAL_COMMUNITY): Payer: Medicare Other

## 2011-07-07 ENCOUNTER — Encounter (HOSPITAL_COMMUNITY): Payer: Self-pay

## 2011-07-07 LAB — BASIC METABOLIC PANEL
Chloride: 95 mEq/L — ABNORMAL LOW (ref 96–112)
Creatinine, Ser: 0.49 mg/dL — ABNORMAL LOW (ref 0.50–1.10)
GFR calc Af Amer: >90 mL/min (ref >90)
Sodium: 131 mEq/L — ABNORMAL LOW (ref 135–145)

## 2011-07-07 LAB — CBC
HCT: 38.1 % (ref 36.0–46.0)
Platelets: 305 10*3/uL (ref 150–400)
RBC: 4.01 MIL/uL (ref 3.87–5.11)
RDW: 12.5 % (ref 11.5–15.5)
WBC: 7.7 10*3/uL (ref 4.0–10.5)

## 2011-07-07 LAB — CREATININE, SERUM
GFR calc Af Amer: >90 mL/min (ref >90)
GFR calc non Af Amer: >90 mL/min (ref >90)

## 2011-07-07 LAB — LIPASE, BLOOD: Lipase: 28 U/L (ref 11–59)

## 2011-07-07 MED ORDER — METRONIDAZOLE IN NACL 5-0.79 MG/ML-% IV SOLN
500.0000 mg | Freq: Three times a day (TID) | INTRAVENOUS | Status: DC
  Administered 2011-07-07 – 2011-07-09 (×8): 500 mg via INTRAVENOUS
  Filled 2011-07-07 (×10): qty 100

## 2011-07-07 MED ORDER — DIPHENHYDRAMINE HCL 12.5 MG/5ML PO ELIX
25.0000 mg | ORAL_SOLUTION | Freq: Four times a day (QID) | ORAL | Status: DC | PRN
  Administered 2011-07-07 – 2011-07-11 (×11): 25 mg via ORAL
  Filled 2011-07-07: qty 5
  Filled 2011-07-07: qty 10
  Filled 2011-07-07 (×2): qty 5
  Filled 2011-07-07 (×2): qty 10
  Filled 2011-07-07: qty 5
  Filled 2011-07-07: qty 10
  Filled 2011-07-07 (×2): qty 5
  Filled 2011-07-07: qty 10
  Filled 2011-07-07: qty 5
  Filled 2011-07-07 (×2): qty 10
  Filled 2011-07-07: qty 5

## 2011-07-07 MED ORDER — CIPROFLOXACIN IN D5W 200 MG/100ML IV SOLN
200.0000 mg | Freq: Two times a day (BID) | INTRAVENOUS | Status: DC
  Administered 2011-07-07 – 2011-07-09 (×5): 200 mg via INTRAVENOUS
  Filled 2011-07-07 (×8): qty 100

## 2011-07-07 MED ORDER — GADOBENATE DIMEGLUMINE 529 MG/ML IV SOLN
15.0000 mL | Freq: Once | INTRAVENOUS | Status: AC | PRN
  Administered 2011-07-07: 13 mL via INTRAVENOUS

## 2011-07-07 MED ORDER — ASPIRIN 81 MG PO CHEW
81.0000 mg | CHEWABLE_TABLET | Freq: Every day | ORAL | Status: DC
  Administered 2011-07-07 – 2011-07-12 (×6): 81 mg via ORAL
  Filled 2011-07-07 (×7): qty 1

## 2011-07-07 MED ORDER — SERTRALINE HCL 50 MG PO TABS
50.0000 mg | ORAL_TABLET | Freq: Every day | ORAL | Status: DC
  Administered 2011-07-07 – 2011-07-12 (×6): 50 mg via ORAL
  Filled 2011-07-07 (×7): qty 1

## 2011-07-07 MED ORDER — IPRATROPIUM BROMIDE 0.02 % IN SOLN: 0.5000 mg | RESPIRATORY_TRACT | Status: DC | PRN

## 2011-07-07 MED ORDER — MORPHINE SULFATE 2 MG/ML IJ SOLN
2.0000 mg | INTRAMUSCULAR | Status: DC | PRN
  Administered 2011-07-07 – 2011-07-11 (×11): 2 mg via INTRAVENOUS
  Filled 2011-07-07 (×11): qty 1

## 2011-07-07 MED ORDER — ALUM & MAG HYDROXIDE-SIMETH 200-200-20 MG/5ML PO SUSP: 30.0000 mL | Freq: Four times a day (QID) | ORAL | Status: DC | PRN

## 2011-07-07 MED ORDER — POTASSIUM CHLORIDE IN NACL 20-0.9 MEQ/L-% IV SOLN
INTRAVENOUS | Status: DC
  Administered 2011-07-07 – 2011-07-08 (×2): via INTRAVENOUS
  Administered 2011-07-08: 75 mL/h via INTRAVENOUS
  Administered 2011-07-09: 12:00:00 via INTRAVENOUS
  Filled 2011-07-07 (×7): qty 1000

## 2011-07-07 MED ORDER — AMLODIPINE BESYLATE 5 MG PO TABS
5.0000 mg | ORAL_TABLET | Freq: Every day | ORAL | Status: DC
  Administered 2011-07-07 – 2011-07-12 (×6): 5 mg via ORAL
  Filled 2011-07-07 (×7): qty 1

## 2011-07-07 MED ORDER — NICOTINE 14 MG/24HR TD PT24
14.0000 mg | MEDICATED_PATCH | Freq: Every day | TRANSDERMAL | Status: DC
  Administered 2011-07-08 – 2011-07-12 (×5): 14 mg via TRANSDERMAL
  Filled 2011-07-07 (×6): qty 1

## 2011-07-07 MED ORDER — CLONAZEPAM 0.5 MG PO TABS
0.5000 mg | ORAL_TABLET | Freq: Every day | ORAL | Status: DC
  Administered 2011-07-07 – 2011-07-11 (×5): 0.5 mg via ORAL
  Filled 2011-07-07 (×5): qty 1

## 2011-07-07 MED ORDER — ENOXAPARIN SODIUM 40 MG/0.4ML ~~LOC~~ SOLN
40.0000 mg | SUBCUTANEOUS | Status: DC
  Administered 2011-07-08 – 2011-07-12 (×5): 40 mg via SUBCUTANEOUS
  Filled 2011-07-07 (×7): qty 0.4

## 2011-07-07 MED ORDER — ALBUTEROL SULFATE (5 MG/ML) 0.5% IN NEBU: 2.5000 mg | INHALATION_SOLUTION | RESPIRATORY_TRACT | Status: DC | PRN

## 2011-07-07 MED ORDER — ONDANSETRON HCL 4 MG PO TABS
4.0000 mg | ORAL_TABLET | Freq: Four times a day (QID) | ORAL | Status: DC | PRN
  Administered 2011-07-08 – 2011-07-10 (×2): 4 mg via ORAL
  Filled 2011-07-07 (×2): qty 1

## 2011-07-07 MED ORDER — DOCUSATE SODIUM 100 MG PO CAPS
100.0000 mg | ORAL_CAPSULE | Freq: Two times a day (BID) | ORAL | Status: DC
  Administered 2011-07-07: 100 mg via ORAL
  Filled 2011-07-07: qty 1

## 2011-07-07 MED ORDER — ACETAMINOPHEN 650 MG RE SUPP: 650.0000 mg | Freq: Four times a day (QID) | RECTAL | Status: DC | PRN

## 2011-07-07 MED ORDER — HYDROCODONE-ACETAMINOPHEN 5-325 MG PO TABS
1.0000 | ORAL_TABLET | ORAL | Status: DC | PRN
  Administered 2011-07-07: 2 via ORAL
  Administered 2011-07-07 (×2): 1 via ORAL
  Administered 2011-07-08 – 2011-07-12 (×7): 2 via ORAL
  Filled 2011-07-07 (×3): qty 2
  Filled 2011-07-07: qty 1
  Filled 2011-07-07 (×2): qty 2
  Filled 2011-07-07: qty 1
  Filled 2011-07-07: qty 2
  Filled 2011-07-07: qty 1
  Filled 2011-07-07: qty 2
  Filled 2011-07-07: qty 1

## 2011-07-07 MED ORDER — HYDROCHLOROTHIAZIDE 12.5 MG PO CAPS
12.5000 mg | ORAL_CAPSULE | Freq: Every day | ORAL | Status: DC
  Filled 2011-07-07: qty 1

## 2011-07-07 MED ORDER — LOSARTAN POTASSIUM 50 MG PO TABS
50.0000 mg | ORAL_TABLET | Freq: Every day | ORAL | Status: DC
  Administered 2011-07-08 – 2011-07-12 (×5): 50 mg via ORAL
  Filled 2011-07-07 (×7): qty 1

## 2011-07-07 MED ORDER — ACETAMINOPHEN 325 MG PO TABS: 650.0000 mg | ORAL_TABLET | Freq: Four times a day (QID) | ORAL | Status: DC | PRN

## 2011-07-07 MED ORDER — PNEUMOCOCCAL VAC POLYVALENT 25 MCG/0.5ML IJ INJ
0.5000 mL | INJECTION | INTRAMUSCULAR | Status: AC | PRN
  Administered 2011-07-12: 0.5 mL via INTRAMUSCULAR

## 2011-07-07 MED ORDER — LOSARTAN POTASSIUM-HCTZ 50-12.5 MG PO TABS: 1.0000 | ORAL_TABLET | Freq: Every day | ORAL | Status: DC

## 2011-07-07 NOTE — ED Notes (Signed)
Pt moved to TCU RM 33-report givrn to [employer], [employer]

## 2011-07-07 NOTE — Progress Notes (Signed)
Subjective: RUQ still hurting but improved with pain meds, also has chronic loose stools  Objective: Vital signs in last 24 hours: Temp:  [97.2 F (36.2 C)-98.8 F (37.1 C)] 98.7 F (37.1 C) (01/17 0842) Pulse Rate:  [59-92] 63  (01/17 0842) Resp:  [14-18] 15  (01/17 0842) BP: (110-179)/(40-89) 124/51 mmHg (01/17 0842) SpO2:  [90 %-98 %] 96 % (01/17 0842) Weight:  [58.968 kg (130 lb)] 58.968 kg (130 lb) (01/16 1626) Weight change:     Intake/Output from previous day:     Physical Exam: General: Alert, awake, oriented x3, in no acute distress. HEENT: No bruits, no goiter. Heart: Regular rate and rhythm, without murmurs, rubs, gallops. Lungs: Clear to auscultation bilaterally. Abdomen: Soft, mildly tender in RUQ, and mild LUQ tenderness, nondistended, positive bowel sounds. Extremities: No clubbing cyanosis or edema with positive pedal pulses. Neuro: Grossly intact, nonfocal.  Lab Results: Basic Metabolic Panel:  Basename 07/07/11 0445 07/06/11 2302 07/06/11 1918  NA 131* -- 126*  K 3.7 -- 4.4  CL 95* -- 88*  CO2 26 -- 26  GLUCOSE 76 -- 105*  BUN 5* -- 7  CREATININE 0.49* 0.48* --  CALCIUM 8.7 -- 9.8  MG -- -- --  PHOS -- -- --   Liver Function Tests:  [facility] 07/06/11 1918  AST 27  ALT 15  ALKPHOS 80  BILITOT 0.5  PROT 7.6  ALBUMIN 3.9    Basename 07/07/11 0445  LIPASE 28  AMYLASE 49   No results found for this basename: AMMONIA:2 in the last 72 hours CBC:  Basename 07/07/11 0445 07/06/11 1918  WBC 7.7 8.6  NEUTROABS -- --  HGB 13.2 14.8  HCT 38.1 42.0  MCV 95.0 93.1  PLT 305 384   Cardiac Enzymes:  Basename 07/06/11 2302  CKTOTAL --  CKMB --  CKMBINDEX --  TROPONINI <0.30   BNP: No results found for this basename: PROBNP:3 in the last 72 hours D-Dimer: No results found for this basename: DDIMER:2 in the last 72 hours CBG: No results found for this basename: GLUCAP:6 in the last 72 hours Hemoglobin A1C: No results found for this  basename: HGBA1C in the last 72 hours Fasting Lipid Panel: No results found for this basename: CHOL,HDL,LDLCALC,TRIG,CHOLHDL,LDLDIRECT in the last 72 hours Thyroid Function Tests: No results found for this basename: TSH,T4TOTAL,FREET4,T3FREE,THYROIDAB in the last 72 hours Anemia Panel: No results found for this basename: VITAMINB12,FOLATE,FERRITIN,TIBC,IRON,RETICCTPCT in the last 72 hours Coagulation: No results found for this basename: LABPROT:2,INR:2 in the last 72 hours Urine Drug Screen: Drugs of Abuse  No results found for this basename: labopia, cocainscrnur, labbenz, amphetmu, thcu, labbarb    Alcohol Level: No results found for this basename: ETH:2 in the last 72 hours  No results found for this or any previous visit (from the past 240 hour(s)).  Studies/Results: US Abdomen Complete  07/06/2011  *RADIOLOGY REPORT*  Clinical Data:  Right upper quadrant pain for 24 hours after a meal of salmon pattties.  COMPLETE ABDOMINAL ULTRASOUND  Comparison:  None.  Findings:  Gallbladder:  A few small polyps are present but there are no stones seen.  Gallbladder wall thickness is normal measuring 1.8 mm.  There is no pericholecystic fluid.  The patient has generalized mid to right-sided abdominal pain but evaluation for sonographic Murphy's sign was limited due to patient sedation.  Common bile duct:  2.8 mm, within normal limits.  Liver:  No focal lesion identified.  Within normal limits in parenchymal echogenicity.  IVC:  Appears  normal.  Pancreas:  No focal abnormality is seen, however the pancreatic duct is mildly enlarged measuring 3.4 mm.  Significance is uncertain; this could represent chronic pancreatitis or obstructing mass.  Spleen:  Within normal limits measuring 7.8 cm  Right Kidney:  Echogenic without masses or hydronephrosis measuring 12.0 cm  Left Kidney:  Echogenic without masses or hydronephrosis measuring 12.5 cm  Abdominal aorta:  Slightly enlarged with a maximal diameter of 2.5 cm.   IMPRESSION: No visible gallstones or pericholecystic fluid.  No biliary ductal dilatation, but pancreatic duct mildly enlarged. This could represent an obstructing lesion, or be a manifestation of chronic inflammation.  Echogenic kidneys without obstruction or mass.  Original Report Authenticated By: Elsie Stain, M.D.   Ct Abdomen Pelvis W Contrast  07/06/2011  *RADIOLOGY REPORT*  Clinical Data: Right upper quadrant abdominal pain, radiating to the back.  CT ABDOMEN AND PELVIS WITH CONTRAST  Technique:  Multidetector CT imaging of the abdomen and pelvis was performed following the standard protocol during bolus administration of intravenous contrast.  Contrast: 80mL OMNIPAQUE IOHEXOL 300 MG/ML IV SOLN  Comparison: Abdominal ultrasound performed earlier today at 08:12 p.m.  Findings: Mild focal right basilar atelectasis is noted.  There is a nonspecific 1.4 cm focus of increased attenuation at the hepatic dome.  A more vague 4.5 cm focus of blush is seen at the inferior tip of the liver.  Though these may reflect an angioma, malignancy cannot be excluded.  A 1.1 cm cystic focus is noted within the right hepatic lobe.  The spleen is unremarkable in appearance.  The pancreas and adrenal glands are unremarkable.  The gallbladder is grossly unremarkable in appearance.  A tiny nonobstructing 2 mm stone is noted at the interpole region of the left kidney.  There is incomplete rotation of the right kidney.  A tiny 5 mm cyst is noted within the interpole region of the right kidney on delayed images.  The kidneys are otherwise unremarkable in appearance.  There is no evidence of hydronephrosis.  No obstructing ureteral stones are seen.  No perinephric stranding is appreciated.  No free fluid is identified.  The small bowel is unremarkable in appearance.  The stomach is filled with contrast and is within normal limits.  No acute vascular abnormalities are seen.  Diffuse calcification is noted along the abdominal aorta  and its branches.  The cecum is flipped into the upper abdomen.  The distal ileum and appendix run alongside each other, extending into the lateral right mid abdomen.  There is no evidence for appendicitis.  Diffuse diverticulosis is noted along the descending and sigmoid colon; there is mild apparent mucosal thickening noted along the sigmoid colon, raising question for a mild infectious process versus mild chronic inflammation.  The colon is otherwise unremarkable in appearance.  The bladder is moderately distended and grossly unremarkable in appearance.  The patient is status post hysterectomy; no suspicious adnexal masses are seen.  No inguinal lymphadenopathy is seen.  No acute osseous abnormalities are identified.  Diffuse vacuum phenomenon and disc space narrowing are noted along the lumbar spine, with associated facet disease.  Endplate sclerotic changes are seen.  IMPRESSION:  1.  Mucosal thickening noted along the sigmoid colon, in a region of diffuse diverticulosis, raising question for a mild infectious process versus mild chronic inflammation.  Diverticulosis involves the descending and sigmoid colon, without evidence of frank diverticulitis. 2.  Vague 4.5 cm focus of blush at the inferior tip of the liver. Additional 1.4  cm focus of increased attenuation at the hepatic dome.  Though these could reflect hemangiomata, malignancy cannot be excluded.  Would correlate with LFTs and consider dynamic liver protocol CT or MRI for further evaluation. 3.  Small hepatic and renal cysts seen. 4.  Tiny nonobstructing 2 mm stone at the interpole region of the left kidney. 5.  Diffuse calcification along the abdominal aorta and its branches. 6.  Mild focal right basilar atelectasis noted. 7.  Diffuse degenerative change along the lumbar spine.  Original Report Authenticated By: Tonia Ghent, M.D.    Medications: Scheduled Meds:   . sodium chloride   Intravenous STAT  . amLODipine  5 mg Oral Daily  . aspirin   81 mg Oral Daily  . ciprofloxacin  200 mg Intravenous Q12H  . clonazePAM  0.5 mg Oral QHS  . diphenhydrAMINE  25 mg Intravenous Once  . diphenhydrAMINE  25 mg Intravenous Once  . docusate sodium  100 mg Oral BID  . enoxaparin  40 mg Subcutaneous Q24H  . hydrochlorothiazide  12.5 mg Oral Daily  .  HYDROmorphone (DILAUDID) injection  1 mg Intravenous Once  . losartan  50 mg Oral Daily  . metronidazole  500 mg Intravenous Q8H  .  morphine injection  4 mg Intravenous Once  . nicotine  14 mg Transdermal Daily  . ondansetron (ZOFRAN) IV  4 mg Intravenous Once  . ondansetron (ZOFRAN) IV  4 mg Intravenous Once  . sertraline  50 mg Oral Daily  . sodium chloride  1,000 mL Intravenous Once  . DISCONTD: losartan-hydrochlorothiazide  1 tablet Oral Daily   Continuous Infusions:   . 0.9 % NaCl with KCl 20 mEq / L 75 mL/hr at 07/07/11 0709   PRN Meds:.acetaminophen, acetaminophen, albuterol, alum & mag hydroxide-simeth, diphenhydrAMINE, HYDROcodone-acetaminophen, iohexol, ipratropium, morphine, ondansetron (ZOFRAN) IV, ondansetron  Assessment/Plan: 1. RUQ abd pain with unremarkable USG and no CT evidence of gall bladder disease although symptoms more consistent with this, will request GI opinion, ? HIDA Start clear liquids 2. Diverticulosis with mild mucosal thickening of sigmoid colon: unclear significance, possibly gastroenteritis vs early diverticulitis start clears, continue empiric cipro and flagyl 3. COPD : stable 4. Hyponatremia: due to dehydration and HCTZ, improving with IVF 5. DVT prophylaxis: lovenox   LOS: 1 day   [facility] Pager: [phone] 07/07/2011, 9:48 AM

## 2011-07-07 NOTE — Progress Notes (Signed)
Pt arrived to unit from TCU on stretcher. Amb self to bed w/ stand by assist. A&Ox4. Assessment as charted. Offers no c/o at present. Oriented to callbell and environment. POC discussed w/ pt and granddtr at bedside.

## 2011-07-07 NOTE — Consult Note (Signed)
Subjective:   HPI  The patient is a 71 year old female who we are asked to see in consultation in regards to right upper quadrant abdominal pain. The pain started 2 days ago and radiated through to her back. There has been some associated nausea but no vomiting and no hematemesis. She denies any specific changes in her bowel pattern. Her white blood cell count is normal hemoglobin and hematocrit normal, amylase and lipase normal, and liver enzymes are not elevated. An abdominal ultrasound was negative for gallstones or biliary ductal dilatation but the pancreatic duct was mentioned as being mildly enlarged. A CT scan of the abdomen and pelvis shows a vague 4.5 cm focus of blushing at the inferior tip of the liver and an additional 1.4 cm focus of increased attenuation at the hepatic dome. These could reflect hemangioma however malignancy could not be excluded. There is some diverticulosis. There is mucosal thickening of the sigmoid colon in a region of diffuse diverticulosis raising the question of mild chronic inflammation there does not appear to be acute diverticulitis however.  Review of Systems She denies chest pain or shortness of breath   Past Medical History  Diagnosis Date  . HIP PAIN 03/06/2007  . HYPERTENSION 02/09/2007  . Restless leg syndrome   . COPD (chronic obstructive pulmonary disease)    Past Surgical History  Procedure Date  . Abdominal hysterectomy   . Dilation and curettage of uterus   . Tonsillectomy    History   Social History  . Marital Status: Widowed    Spouse Name: N/A    Number of Children: N/A  . Years of Education: N/A   Occupational History  . Not on file.   Social History Main Topics  . Smoking status: Current Everyday Smoker -- 0.5 packs/day    Types: Cigarettes  . Smokeless tobacco: Never Used  . Alcohol Use: 0.5 oz/week    1 drink(s) per week  . Drug Use: No  . Sexually Active: No   Other Topics Concern  . Not on file   Social History  Narrative  . No narrative on file   family history includes Heart disease in her brother and sister. Current facility-administered medications:0.9 %  sodium chloride infusion, , Intravenous, STAT, Raeford Razor, MD, Last Rate: 20 mL/hr at 07/06/11 2226;  0.9 % NaCl with KCl 20 mEq/ L  infusion, , Intravenous, Continuous, Debby Crosley, MD, Last Rate: 75 mL/hr at 07/07/11 0709;  acetaminophen (TYLENOL) suppository 650 mg, 650 mg, Rectal, Q6H PRN, Gery Pray, MD;  acetaminophen (TYLENOL) tablet 650 mg, 650 mg, Oral, Q6H PRN, Debby Crosley, MD albuterol (PROVENTIL) (5 MG/ML) 0.5% nebulizer solution 2.5 mg, 2.5 mg, Nebulization, Q4H PRN, Debby Crosley, MD;  alum & mag hydroxide-simeth (MAALOX/MYLANTA) 200-200-20 MG/5ML suspension 30 mL, 30 mL, Oral, Q6H PRN, Debby Crosley, MD;  amLODipine (NORVASC) tablet 5 mg, 5 mg, Oral, Daily, Debby Crosley, MD, 5 mg at 07/07/11 1103;  aspirin chewable tablet 81 mg, 81 mg, Oral, Daily, Debby Crosley, MD, 81 mg at 07/07/11 1104 ciprofloxacin (CIPRO) IVPB 200 mg, 200 mg, Intravenous, Q12H, Debby Crosley, MD, 200 mg at 07/07/11 1619;  clonazePAM (KLONOPIN) tablet 0.5 mg, 0.5 mg, Oral, QHS, Debby Crosley, MD;  diphenhydrAMINE (BENADRYL) 12.5 MG/5ML elixir 25 mg, 25 mg, Oral, QID PRN, Debby Crosley, MD, 25 mg at 07/07/11 1214;  diphenhydrAMINE (BENADRYL) injection 25 mg, 25 mg, Intravenous, Once, Raeford Razor, MD, 25 mg at 07/06/11 1909 diphenhydrAMINE (BENADRYL) injection 25 mg, 25 mg, Intravenous, Once, Raeford Razor, MD,  25 mg at 07/06/11 2224;  enoxaparin (LOVENOX) injection 40 mg, 40 mg, Subcutaneous, Q24H, Debby Crosley, MD;  HYDROcodone-acetaminophen (NORCO) 5-325 MG per tablet 1-2 tablet, 1-2 tablet, Oral, Q4H PRN, Gery Pray, MD, 1 tablet at 07/07/11 1346;  HYDROmorphone (DILAUDID) injection 1 mg, 1 mg, Intravenous, Once, Raeford Razor, MD, 1 mg at 07/06/11 2151 iohexol (OMNIPAQUE) 300 MG/ML solution 100 mL, 100 mL, Intravenous, Once PRN, Medication Radiologist,  MD, 80 mL at 07/06/11 2258;  ipratropium (ATROVENT) nebulizer solution 0.5 mg, 0.5 mg, Nebulization, Q4H PRN, Gery Pray, MD;  losartan (COZAAR) tablet 50 mg, 50 mg, Oral, Daily, Lorenza Evangelist, PHARMD;  metroNIDAZOLE (FLAGYL) IVPB 500 mg, 500 mg, Intravenous, Q8H, Debby Crosley, MD, 500 mg at 07/07/11 1215 morphine 2 MG/ML injection 2 mg, 2 mg, Intravenous, Q4H PRN, Gery Pray, MD;  morphine 4 MG/ML injection 4 mg, 4 mg, Intravenous, Once, Raeford Razor, MD, 4 mg at 07/06/11 1905;  nicotine (NICODERM CQ - dosed in mg/24 hours) patch 14 mg, 14 mg, Transdermal, Daily, Debby Crosley, MD;  ondansetron (ZOFRAN) injection 4 mg, 4 mg, Intravenous, Once, Raeford Razor, MD, 4 mg at 07/06/11 1903 ondansetron [facility]) injection 4 mg, 4 mg, Intravenous, Once, Raeford Razor, MD, 4 mg at 07/06/11 2149;  ondansetron (ZOFRAN) injection 4 mg, 4 mg, Intravenous, Q8H PRN, Raeford Razor, MD;  ondansetron [facility]) tablet 4 mg, 4 mg, Oral, Q6H PRN, Gery Pray, MD;  pneumococcal 23 valent vaccine (PNU-IMMUNE) injection 0.5 mL, 0.5 mL, Intramuscular, Prior to discharge, Hurman Horn, MD sertraline (ZOLOFT) tablet 50 mg, 50 mg, Oral, Daily, Debby Crosley, MD, 50 mg at 07/07/11 1104;  sodium chloride 0.9 % bolus 1,000 mL, 1,000 mL, Intravenous, Once, Raeford Razor, MD, 1,000 mL at 07/06/11 1928;  DISCONTD: docusate sodium (COLACE) capsule 100 mg, 100 mg, Oral, BID, Debby Crosley, MD, 100 mg at 07/07/11 0442;  DISCONTD: hydrochlorothiazide (MICROZIDE) capsule 12.5 mg, 12.5 mg, Oral, Daily, Lorenza Evangelist, PHARMD DISCONTD: losartan-hydrochlorothiazide (HYZAAR) 50-12.5 MG per tablet 1 tablet, 1 tablet, Oral, Daily, Debby Crosley, MD Current outpatient prescriptions:amLODipine (NORVASC) 5 MG tablet, Take 1 tablet (5 mg total) by mouth daily., Disp: 90 tablet, Rfl: 1;  aspirin 81 MG tablet, Take 81 mg by mouth daily.  , Disp: , Rfl: ;  calcium-vitamin D (OSCAL WITH D) 500-200 MG-UNIT per tablet, Take 1 tablet by mouth  daily., Disp: , Rfl: ;  clonazePAM (KLONOPIN) 0.5 MG tablet, Take 1 tablet (0.5 mg total) by mouth at bedtime., Disp: 90 tablet, Rfl: 4 diphenhydrAMINE (BENADRYL) 12.5 MG/5ML elixir, Take 50 mg by mouth 4 (four) times daily as needed. For itching, Disp: , Rfl: ;  losartan-hydrochlorothiazide (HYZAAR) 50-12.5 MG per tablet, Take 1 tablet by mouth daily., Disp: 90 tablet, Rfl: 6;  sertraline (ZOLOFT) 50 MG tablet, Take 1 tablet (50 mg total) by mouth daily., Disp: 30 tablet, Rfl: 11 traMADol (ULTRAM) 50 MG tablet, Take 50 mg by mouth every 6 (six) hours as needed. For pain. Take with benadryl for itching., Disp: , Rfl: ;  vitamin E 100 UNIT capsule, Take 100 Units by mouth daily., Disp: , Rfl:  Allergies  Allergen Reactions  . Tetanus Toxoid Anaphylaxis  . Codeine Nausea And Vomiting and Other (See Comments)    Itching and faint   . Erythromycin Nausea And Vomiting  . Meloxicam Itching  . Penicillins Hives  . Prednisone Other (See Comments)    GI bleed     Objective:     BP 131/55  Pulse 62  Temp(Src) 98.7 F (  37.1 C) (Oral)  Resp 20  SpO2 93%  Gen. she is alert and does not appear in any acute distress  Skin nonicteric  Heart regular rhythm no murmurs  Lungs clear  Abdomen bowel sounds are normal it is soft but there is some tenderness in the right upper quadrant and right mid abdomen.  Laboratory No components found with this basename: d1      Assessment:     #1 right upper quadrant abdominal pain in which the symptoms are suspicious for gallbladder pathology however the ultrasound does not reveal gallstones. One must consider the possibility of biliary dyskinesia as a possible source for this pain.   #2 diverticulosis  #3 abnormal findings on CT scan in the region of the liver      Plan:     I would recommend doing a hiatus scan with ejection fraction to look for evidence of biliary dyskinesia. I would recommend doing an MRI of the abdomen to further evaluate the  findings in the region of the liver.     Component Value Date/Time   WBC 7.7 07/07/2011 0445   HGB 13.2 07/07/2011 0445   HCT 38.1 07/07/2011 0445   PLT 305 07/07/2011 0445   ALT 15 07/06/2011 1918   AST 27 07/06/2011 1918   NA 131* 07/07/2011 0445   K 3.7 07/07/2011 0445   CL 95* 07/07/2011 0445   CREATININE 0.49* 07/07/2011 0445   BUN 5* 07/07/2011 0445   CO2 26 07/07/2011 0445   CALCIUM 8.7 07/07/2011 0445   ALKPHOS 80 07/06/2011 1918

## 2011-07-08 ENCOUNTER — Encounter (HOSPITAL_COMMUNITY): Payer: Self-pay | Admitting: Radiology

## 2011-07-08 ENCOUNTER — Inpatient Hospital Stay (HOSPITAL_COMMUNITY): Payer: Medicare Other

## 2011-07-08 LAB — BASIC METABOLIC PANEL
Chloride: 100 mEq/L (ref 96–112)
GFR calc Af Amer: >90 mL/min (ref >90)
Potassium: 3.8 mEq/L (ref 3.5–5.1)

## 2011-07-08 LAB — CBC
HCT: 35.4 % — ABNORMAL LOW (ref 36.0–46.0)
Platelets: 278 10*3/uL (ref 150–400)
RBC: 3.67 MIL/uL — ABNORMAL LOW (ref 3.87–5.11)
RDW: 12.3 % (ref 11.5–15.5)
WBC: 6 10*3/uL (ref 4.0–10.5)

## 2011-07-08 MED ORDER — TECHNETIUM TC 99M MEBROFENIN IV KIT
5.4000 | PACK | Freq: Once | INTRAVENOUS | Status: AC | PRN
  Administered 2011-07-08: 5.4 via INTRAVENOUS

## 2011-07-08 MED ORDER — SINCALIDE 5 MCG IJ SOLR
0.0200 ug/kg | Freq: Once | INTRAMUSCULAR | Status: AC
  Administered 2011-07-08: 1.2 ug via INTRAVENOUS
  Filled 2011-07-08: qty 5

## 2011-07-08 NOTE — Progress Notes (Signed)
Subjective: RUQ still hurting but improved with pain meds, had a loose stool today   Objective: Vital signs in last 24 hours: Temp:  [98 F (36.7 C)-98.9 F (37.2 C)] 98.2 F (36.8 C) (01/18 1400) Pulse Rate:  [55-72] 63  (01/18 1400) Resp:  [14-20] 18  (01/18 1400) BP: (142-180)/(64-82) 180/82 mmHg (01/18 1400) SpO2:  [90 %-96 %] 90 % (01/18 1400) Weight:  [58.8 kg (129 lb 10.1 oz)] 58.8 kg (129 lb 10.1 oz) (01/17 1657) Weight change:  Last BM Date: 07/08/11  Intake/Output from previous day: 01/17 0701 - 01/18 0700 In: 1513 [P.O.:240; I.V.:1073; IV Piggyback:200] Out: -  Total I/O In: 120 [P.O.:120] Out: -  Physical Exam: General: Alert, awake, oriented x3, in no acute distress. HEENT: No bruits, no goiter. Heart: Regular rate and rhythm, without murmurs, rubs, gallops. Lungs: Clear to auscultation bilaterally. Abdomen: Soft, mildly tender in RUQ, and mild LUQ tenderness, nondistended, positive bowel sounds. Extremities: No clubbing cyanosis or edema with positive pedal pulses. Neuro: Grossly intact, nonfocal.  Lab Results: Basic Metabolic Panel:  Basename 07/08/11 0340 07/07/11 0445  NA 132* 131*  K 3.8 3.7  CL 100 95*  CO2 27 26  GLUCOSE 82 76  BUN 5* 5*  CREATININE 0.53 0.49*  CALCIUM 8.1* 8.7  MG -- --  PHOS -- --   Liver Function Tests:  [facility] 07/06/11 1918  AST 27  ALT 15  ALKPHOS 80  BILITOT 0.5  PROT 7.6  ALBUMIN 3.9    Basename 07/07/11 0445  LIPASE 28  AMYLASE 49   No results found for this basename: AMMONIA:2 in the last 72 hours CBC:  Basename 07/08/11 0340 07/07/11 0445  WBC 6.0 7.7  NEUTROABS -- --  HGB 11.9* 13.2  HCT 35.4* 38.1  MCV 96.5 95.0  PLT 278 305   Cardiac Enzymes:  Basename 07/06/11 2302  CKTOTAL --  CKMB --  CKMBINDEX --  TROPONINI <0.30   BNP: No results found for this basename: PROBNP:3 in the last 72 hours D-Dimer: No results found for this basename: DDIMER:2 in the last 72 hours CBG: No results  found for this basename: GLUCAP:6 in the last 72 hours Hemoglobin A1C: No results found for this basename: HGBA1C in the last 72 hours Fasting Lipid Panel: No results found for this basename: CHOL,HDL,LDLCALC,TRIG,CHOLHDL,LDLDIRECT in the last 72 hours Thyroid Function Tests:  Basename 07/06/11 2302  TSH 3.542  T4TOTAL --  FREET4 --  T3FREE --  THYROIDAB --   Anemia Panel: No results found for this basename: VITAMINB12,FOLATE,FERRITIN,TIBC,IRON,RETICCTPCT in the last 72 hours Coagulation: No results found for this basename: LABPROT:2,INR:2 in the last 72 hours Urine Drug Screen: Drugs of Abuse  No results found for this basename: labopia,  cocainscrnur,  labbenz,  amphetmu,  thcu,  labbarb    Alcohol Level: No results found for this basename: ETH:2 in the last 72 hours  No results found for this or any previous visit (from the past 240 hour(s)).  Studies/Results: Mr Abdomen W Wo Contrast  07/08/2011  *RADIOLOGY REPORT*  Clinical Data: Abdominal pain,  abnormal CT, indeterminate liver lesion.  MRI ABDOMEN WITH AND WITHOUT CONTRAST  Technique:  Multiplanar multisequence MR imaging of the abdomen was performed both before and after administration of intravenous contrast.  Contrast: 13mL MULTIHANCE GADOBENATE DIMEGLUMINE 529 MG/ML IV SOLN  Comparison: CT abdomen 07/05/2008  Findings: Small bilateral pleural effusions.  No pericardial fluid.  No intrahepatic biliary ductal dilatation.  The gallbladder appears normal without evidence of gallbladder  wall thickening.  The common bile duct appears normal.  Lesion of concern in the inferior right hepatic lobe demonstrates a central focus of high T2 signal (image 32, series 3).  This lesion has intense early arterial enhancement in a roughly geographic pattern measuring approximately 4.1 x 2.3 cm (image 62, series 1103).  Lesion becomes isointense on the delayed series.  The second lesion in the more superior right hepatic lobe measures  approximately 17 mm and  has early arterial enhancement (image 20, series 1103).   This lesion is not evident on the postcontrast series.  There are two additional small simple cysts within the right hepatic lobe (image 6, series 3 and image 24, series 3).  There is the pancreas, spleen, adrenal glands, and kidneys are normal.  The stomach and limited view of the small bowel and colon appear normal.  No abdominal lymphadenopathy.  IMPRESSION:  1. Two indeterminate enhancing lesions within the liver.  The larger lesion in the inferior right hepatic lobe may represent atypical focal nodular hyperplasia.  The lesion in the more superior right hepatic lobe likely represents a flash filling hemangioma or vascular phenomenon.  Due to the indeterminate characterization of the  larger lesion, recommend follow-up MRI in 3 to 6 months. Recommend the hepatocyte specific imaging MRI contrast agent (EOVIST) to increase specificity of characterization. 2.  The gallbladder  and biliary tree appear normal.  Original Report Authenticated By: Genevive Bi, M.D.   US Abdomen Complete  07/06/2011  *RADIOLOGY REPORT*  Clinical Data:  Right upper quadrant pain for 24 hours after a meal of salmon pattties.  COMPLETE ABDOMINAL ULTRASOUND  Comparison:  None.  Findings:  Gallbladder:  A few small polyps are present but there are no stones seen.  Gallbladder wall thickness is normal measuring 1.8 mm.  There is no pericholecystic fluid.  The patient has generalized mid to right-sided abdominal pain but evaluation for sonographic Murphy's sign was limited due to patient sedation.  Common bile duct:  2.8 mm, within normal limits.  Liver:  No focal lesion identified.  Within normal limits in parenchymal echogenicity.  IVC:  Appears normal.  Pancreas:  No focal abnormality is seen, however the pancreatic duct is mildly enlarged measuring 3.4 mm.  Significance is uncertain; this could represent chronic pancreatitis or obstructing mass.   Spleen:  Within normal limits measuring 7.8 cm  Right Kidney:  Echogenic without masses or hydronephrosis measuring 12.0 cm  Left Kidney:  Echogenic without masses or hydronephrosis measuring 12.5 cm  Abdominal aorta:  Slightly enlarged with a maximal diameter of 2.5 cm.  IMPRESSION: No visible gallstones or pericholecystic fluid.  No biliary ductal dilatation, but pancreatic duct mildly enlarged. This could represent an obstructing lesion, or be a manifestation of chronic inflammation.  Echogenic kidneys without obstruction or mass.  Original Report Authenticated By: Elsie Stain, M.D.   Ct Abdomen Pelvis W Contrast  07/06/2011  *RADIOLOGY REPORT*  Clinical Data: Right upper quadrant abdominal pain, radiating to the back.  CT ABDOMEN AND PELVIS WITH CONTRAST  Technique:  Multidetector CT imaging of the abdomen and pelvis was performed following the standard protocol during bolus administration of intravenous contrast.  Contrast: 80mL OMNIPAQUE IOHEXOL 300 MG/ML IV SOLN  Comparison: Abdominal ultrasound performed earlier today at 08:12 p.m.  Findings: Mild focal right basilar atelectasis is noted.  There is a nonspecific 1.4 cm focus of increased attenuation at the hepatic dome.  A more vague 4.5 cm focus of blush is seen at the  inferior tip of the liver.  Though these may reflect an angioma, malignancy cannot be excluded.  A 1.1 cm cystic focus is noted within the right hepatic lobe.  The spleen is unremarkable in appearance.  The pancreas and adrenal glands are unremarkable.  The gallbladder is grossly unremarkable in appearance.  A tiny nonobstructing 2 mm stone is noted at the interpole region of the left kidney.  There is incomplete rotation of the right kidney.  A tiny 5 mm cyst is noted within the interpole region of the right kidney on delayed images.  The kidneys are otherwise unremarkable in appearance.  There is no evidence of hydronephrosis.  No obstructing ureteral stones are seen.  No perinephric  stranding is appreciated.  No free fluid is identified.  The small bowel is unremarkable in appearance.  The stomach is filled with contrast and is within normal limits.  No acute vascular abnormalities are seen.  Diffuse calcification is noted along the abdominal aorta and its branches.  The cecum is flipped into the upper abdomen.  The distal ileum and appendix run alongside each other, extending into the lateral right mid abdomen.  There is no evidence for appendicitis.  Diffuse diverticulosis is noted along the descending and sigmoid colon; there is mild apparent mucosal thickening noted along the sigmoid colon, raising question for a mild infectious process versus mild chronic inflammation.  The colon is otherwise unremarkable in appearance.  The bladder is moderately distended and grossly unremarkable in appearance.  The patient is status post hysterectomy; no suspicious adnexal masses are seen.  No inguinal lymphadenopathy is seen.  No acute osseous abnormalities are identified.  Diffuse vacuum phenomenon and disc space narrowing are noted along the lumbar spine, with associated facet disease.  Endplate sclerotic changes are seen.  IMPRESSION:  1.  Mucosal thickening noted along the sigmoid colon, in a region of diffuse diverticulosis, raising question for a mild infectious process versus mild chronic inflammation.  Diverticulosis involves the descending and sigmoid colon, without evidence of frank diverticulitis. 2.  Vague 4.5 cm focus of blush at the inferior tip of the liver. Additional 1.4 cm focus of increased attenuation at the hepatic dome.  Though these could reflect hemangiomata, malignancy cannot be excluded.  Would correlate with LFTs and consider dynamic liver protocol CT or MRI for further evaluation. 3.  Small hepatic and renal cysts seen. 4.  Tiny nonobstructing 2 mm stone at the interpole region of the left kidney. 5.  Diffuse calcification along the abdominal aorta and its branches. 6.  Mild  focal right basilar atelectasis noted. 7.  Diffuse degenerative change along the lumbar spine.  Original Report Authenticated By: Tonia Ghent, M.D.   Nm Hepato W/eject Fract  07/08/2011  *RADIOLOGY REPORT*  Clinical Data:  Right upper quadrant pain.  NUCLEAR MEDICINE HEPATOBILIARY IMAGING WITH GALLBLADDER EF  Technique:  Sequential images of the abdomen were obtained out to 60 minutes following intravenous administration of radiopharmaceutical.  After slow intravenous infusion of 1.2 micrograms Cholecystokinin, gallbladder ejection fraction was determined.  Radiopharmaceutical:  5.4 mCi Tc-84m Choletec  Comparison:  MR abdomen 07/07/2011.  Findings: Homogeneous uptake by the liver.  Gallbladder is visualized by 12 minutes.  Small bowel not visualized in the first 60 minutes.  Small bowel visualized on initial imaging after injection of CCK.  Calculated gallbladder ejection fraction of 46.7% which is within the range normal limits (greater than 30%)  The patient did experience symptoms during CCK infusion.  IMPRESSION: Normal gallbladder ejection fraction.  The patient experienced pain with injection of CCK.  Original Report Authenticated By: Fuller Canada, M.D.    Medications: Scheduled Meds:    . amLODipine  5 mg Oral Daily  . aspirin  81 mg Oral Daily  . ciprofloxacin  200 mg Intravenous Q12H  . clonazePAM  0.5 mg Oral QHS  . enoxaparin  40 mg Subcutaneous Q24H  . losartan  50 mg Oral Daily  . metronidazole  500 mg Intravenous Q8H  . nicotine  14 mg Transdermal Daily  . sertraline  50 mg Oral Daily  . sincalide  0.02 mcg/kg Intravenous Once   Continuous Infusions:    . 0.9 % NaCl with KCl 20 mEq / L 75 mL/hr at 07/08/11 0620   PRN Meds:.acetaminophen, acetaminophen, albuterol, alum & mag hydroxide-simeth, diphenhydrAMINE, gadobenate dimeglumine, HYDROcodone-acetaminophen, ipratropium, morphine, ondansetron, pneumococcal 23 valent vaccine, technetium TC 24M  mebrofenin  Assessment/Plan: 1. RUQ abd pain : Workup Negative so far, HIDA scan shows a normal gallbladder ejection fraction, Greatly appreciate Dr. Luan Moore input plan for EGD noted 2. Diverticulosis with mild mucosal thickening of sigmoid colon: unclear significance, possibly gastroenteritis vs early diverticulitis Advance diet, continue empiric cipro and flagyl Will need a colonoscopy at some point 3. 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 COPD : stable 4. Hyponatremia: due to dehydration and HCTZ, improving with IVF 5. DVT prophylaxis: lovenox   LOS: 2 days   [facility] Pager: [phone] 07/08/2011, 3:04 PM

## 2011-07-08 NOTE — Consult Note (Signed)
[facility] Progress Note  Subjective: The patient is still experiencing right upper quadrant pain which she states goes through to her back. The MRI of her abdomen showed 2 lesions in the liver one most likely a atypical focal nodular hyperplasia and the other a hemangioma. It was recommended to do a followup study in 3-6 months. In talking to the patient I told her that it did not seem that either of these lesions were causing her pain. The HIDA scan with ejection fraction was normal. She tells me that she has never had a colonoscopy although she has been told in the past that she should have one. She told me that back in the late 1980s she had an ulcer.  Objective: Vital signs in last 24 hours: Temp:  [98 F (36.7 C)-98.9 F (37.2 C)] 98 F (36.7 C) (01/18 0547) Pulse Rate:  [55-72] 55  (01/18 0547) Resp:  [14-20] 14  (01/18 0547) BP: (131-158)/(55-72) 142/72 mmHg (01/18 1226) SpO2:  [91 %-96 %] 93 % (01/18 0547) Weight:  [58.8 kg (129 lb 10.1 oz)] 58.8 kg (129 lb 10.1 oz) (01/17 1657) Weight change:    PE: She is in no distress  Heart regular rhythm  Abdomen soft with some mild tenderness in the right upper quadrant  Lab Results: Results for orders placed during the hospital encounter of 07/06/11 (from the past 24 hour(s))  CBC     Status: Abnormal   Collection Time   07/08/11  3:40 AM      Component Value Range   WBC 6.0  4.0 - 10.5 (K/uL)   RBC 3.67 (*) 3.87 - 5.11 (MIL/uL)   Hemoglobin 11.9 (*) 12.0 - 15.0 (g/dL)   HCT 16.1 (*) 09.6 - 46.0 (%)   MCV 96.5  78.0 - 100.0 (fL)   MCH 32.4  26.0 - 34.0 (pg)   MCHC 33.6  30.0 - 36.0 (g/dL)   RDW 04.5  40.9 - 81.1 (%)   Platelets 278  150 - 400 (K/uL)  BASIC METABOLIC PANEL     Status: Abnormal   Collection Time   07/08/11  3:40 AM      Component Value Range   Sodium 132 (*) 135 - 145 (mEq/L)   Potassium 3.8  3.5 - 5.1 (mEq/L)   Chloride 100  96 - 112 (mEq/L)   CO2 27  19 - 32 (mEq/L)   Glucose, Bld 82  70 - 99  (mg/dL)   BUN 5 (*) 6 - 23 (mg/dL)   Creatinine, Ser 9.14  0.50 - 1.10 (mg/dL)   Calcium 8.1 (*) 8.4 - 10.5 (mg/dL)   GFR calc non Af Amer >90  >90 (mL/min)   GFR calc Af Amer >90  >90 (mL/min)    Studies/Results: @RISRSLT24 @    Assessment: Right upper quadrant abdominal pain of unclear etiology  Focal nodular hyperplasia and liver it was recommended to do a followup in 3-6 months  Hemangioma of the liver  Diverticulosis  Plan: I think that it would be reasonable at this point to advance her diet to more of a full liquid. I think it would also be reasonable to do an EGD to see if she might have had peptic ulcer which might be causing this atypical presentation of right upper quadrant abdominal pain. I think that she at some point should have a colonoscopy. Continue Cipro and Flagyl    Nazia Rhines F 07/08/2011, 1:26 PM

## 2011-07-08 NOTE — Progress Notes (Signed)
   CARE MANAGEMENT NOTE 07/08/2011  Patient:  Jill White, Jill White   Account Number:  [id number]  Date Initiated:  07/08/2011  Documentation initiated by:  Lanier Clam  Subjective/Objective Assessment:   ADMITTED W/ABD PAIN.     Action/Plan:   FROM  HOME   Anticipated DC Date:  07/12/2011   Anticipated DC Plan:  HOME/SELF CARE         Choice offered to / List presented to:             Status of service:  In process, will continue to follow Medicare Important Message given?   (If response is "NO", the following Medicare IM given date fields will be blank) Date Medicare IM given:   Date Additional Medicare IM given:    Discharge Disposition:    Per UR Regulation:  Reviewed for med. necessity/level of care/duration of stay  Comments:  07/08/11 [facility] Isabella Roemmich RN,BSN NCM [phone]

## 2011-07-09 ENCOUNTER — Other Ambulatory Visit: Payer: Self-pay | Admitting: Gastroenterology

## 2011-07-09 ENCOUNTER — Encounter (HOSPITAL_COMMUNITY): Payer: Self-pay | Admitting: *Deleted

## 2011-07-09 ENCOUNTER — Encounter (HOSPITAL_COMMUNITY)
Admission: EM | Disposition: A | Discharge status: Home / Self Care | Payer: Self-pay | Source: Home / Self Care | Attending: Internal Medicine

## 2011-07-09 HISTORY — PX: ESOPHAGOGASTRODUODENOSCOPY: SHX5428

## 2011-07-09 SURGERY — EGD (ESOPHAGOGASTRODUODENOSCOPY)
Anesthesia: Moderate Sedation

## 2011-07-09 MED ORDER — FENTANYL NICU IV SYRINGE 50 MCG/ML
INJECTION | INTRAMUSCULAR | Status: DC | PRN
  Administered 2011-07-09 (×2): 25 ug via INTRAVENOUS

## 2011-07-09 MED ORDER — MIDAZOLAM HCL 10 MG/2ML IJ SOLN
INTRAMUSCULAR | Status: DC | PRN
  Administered 2011-07-09 (×2): 2 mg via INTRAVENOUS

## 2011-07-09 MED ORDER — MIDAZOLAM HCL 10 MG/2ML IJ SOLN
INTRAMUSCULAR | Status: AC
  Filled 2011-07-09: qty 4

## 2011-07-09 MED ORDER — FUROSEMIDE 10 MG/ML IJ SOLN
40.0000 mg | Freq: Once | INTRAMUSCULAR | Status: AC
  Administered 2011-07-09: 40 mg via INTRAVENOUS
  Filled 2011-07-09 (×2): qty 4

## 2011-07-09 MED ORDER — FENTANYL CITRATE 0.05 MG/ML IJ SOLN
INTRAMUSCULAR | Status: AC
  Filled 2011-07-09: qty 4

## 2011-07-09 MED ORDER — HYDROCOD POLST-CHLORPHEN POLST 10-8 MG/5ML PO LQCR: 5.0000 mL | Freq: Two times a day (BID) | ORAL | Status: DC | PRN

## 2011-07-09 MED ORDER — PANTOPRAZOLE SODIUM 40 MG IV SOLR
40.0000 mg | Freq: Two times a day (BID) | INTRAVENOUS | Status: DC
  Administered 2011-07-09 – 2011-07-12 (×7): 40 mg via INTRAVENOUS
  Filled 2011-07-09 (×11): qty 40

## 2011-07-09 MED ORDER — BUTAMBEN-TETRACAINE-BENZOCAINE 2-2-14 % EX AERO
INHALATION_SPRAY | CUTANEOUS | Status: DC | PRN
  Administered 2011-07-09: 2 via TOPICAL

## 2011-07-09 MED ORDER — DIPHENHYDRAMINE HCL 50 MG/ML IJ SOLN
INTRAMUSCULAR | Status: AC
  Filled 2011-07-09: qty 1

## 2011-07-09 MED ORDER — AMLODIPINE BESYLATE 5 MG PO TABS: 5.0000 mg | ORAL_TABLET | Freq: Every day | ORAL | Status: DC

## 2011-07-09 MED ORDER — FUROSEMIDE 10 MG/ML IJ SOLN
40.0000 mg | Freq: Once | INTRAMUSCULAR | Status: AC
  Administered 2011-07-10: 40 mg via INTRAVENOUS
  Filled 2011-07-09: qty 4

## 2011-07-09 NOTE — Progress Notes (Signed)
Report received from previous nurse who was sent home, patient appears to be sleeping, eyes closed, resp even and unlabored. Will continue to monitor

## 2011-07-09 NOTE — Progress Notes (Signed)
[facility] Progress Note  Subjective Pain a little better today with some spread to the left upper quadrant Objective: Vital signs in last 24 hours: Temp:  [97.8 F (36.6 C)-98.7 F (37.1 C)] 98.7 F (37.1 C) (01/19 0939) Pulse Rate:  [50-63] 50  (01/19 0630) Resp:  [16-30] 16  (01/19 1040) BP: (118-182)/(60-97) 137/60 mmHg (01/19 1040) SpO2:  [88 %-99 %] 88 % (01/19 1040) Weight:  [58.514 kg (129 lb)] 58.514 kg (129 lb) (01/19 0939) Weight change:    PE: Abdomen soft  Lab Results: No results found for this or any previous visit (from the past 24 hour(s)).  Studies/Results: Mr Abdomen W Wo Contrast  07/08/2011  *RADIOLOGY REPORT*  Clinical Data: Abdominal pain,  abnormal CT, indeterminate liver lesion.  MRI ABDOMEN WITH AND WITHOUT CONTRAST  Technique:  Multiplanar multisequence MR imaging of the abdomen was performed both before and after administration of intravenous contrast.  Contrast: 13mL MULTIHANCE GADOBENATE DIMEGLUMINE 529 MG/ML IV SOLN  Comparison: CT abdomen 07/05/2008  Findings: Small bilateral pleural effusions.  No pericardial fluid.  No intrahepatic biliary ductal dilatation.  The gallbladder appears normal without evidence of gallbladder wall thickening.  The common bile duct appears normal.  Lesion of concern in the inferior right hepatic lobe demonstrates a central focus of high T2 signal (image 32, series 3).  This lesion has intense early arterial enhancement in a roughly geographic pattern measuring approximately 4.1 x 2.3 cm (image 62, series 1103).  Lesion becomes isointense on the delayed series.  The second lesion in the more superior right hepatic lobe measures approximately 17 mm and  has early arterial enhancement (image 20, series 1103).   This lesion is not evident on the postcontrast series.  There are two additional small simple cysts within the right hepatic lobe (image 6, series 3 and image 24, series 3).  There is the pancreas, spleen, adrenal  glands, and kidneys are normal.  The stomach and limited view of the small bowel and colon appear normal.  No abdominal lymphadenopathy.  IMPRESSION:  1. Two indeterminate enhancing lesions within the liver.  The larger lesion in the inferior right hepatic lobe may represent atypical focal nodular hyperplasia.  The lesion in the more superior right hepatic lobe likely represents a flash filling hemangioma or vascular phenomenon.  Due to the indeterminate characterization of the  larger lesion, recommend follow-up MRI in 3 to 6 months. Recommend the hepatocyte specific imaging MRI contrast agent (EOVIST) to increase specificity of characterization. 2.  The gallbladder  and biliary tree appear normal.  Original Report Authenticated By: Genevive Bi, M.D.   Nm Hepato W/eject Fract  07/08/2011  *RADIOLOGY REPORT*  Clinical Data:  Right upper quadrant pain.  NUCLEAR MEDICINE HEPATOBILIARY IMAGING WITH GALLBLADDER EF  Technique:  Sequential images of the abdomen were obtained out to 60 minutes following intravenous administration of radiopharmaceutical.  After slow intravenous infusion of 1.2 micrograms Cholecystokinin, gallbladder ejection fraction was determined.  Radiopharmaceutical:  5.4 mCi Tc-46m Choletec  Comparison:  MR abdomen 07/07/2011.  Findings: Homogeneous uptake by the liver.  Gallbladder is visualized by 12 minutes.  Small bowel not visualized in the first 60 minutes.  Small bowel visualized on initial imaging after injection of CCK.  Calculated gallbladder ejection fraction of 46.7% which is within the range normal limits (greater than 30%)  The patient did experience symptoms during CCK infusion.  IMPRESSION: Normal gallbladder ejection fraction.  The patient experienced pain with injection of CCK.  Original Report Authenticated By:  Fuller Canada, M.D.   EGD revealed a small prepyloric antral ulcer versus erosion unclear whether significant enough to be causing abdominal  pain   Assessment: Small antral erosions/ulcer Reproduction of abdominal pain by CCK stimulated papaya scan otherwise negative study Indeterminate liver lesions  Plan: Continue proton pump inhibitor and await H. pylori status. Continue to consider the possibility of biliary colic although evidences not strong for this. Will advance diet.    Jill White C 07/09/2011, 10:49 AM

## 2011-07-09 NOTE — Progress Notes (Signed)
Dr. Jomarie Longs aware via phone pt continues with diarrhea. Pt most recent episode followed new bland diet. IVF stopped as ordered. Recent lab work discussed with MD. See new order entered by Dr. Jomarie Longs.

## 2011-07-09 NOTE — Op Note (Signed)
[facility] [address] [place], [place]  [address]  ENDOSCOPY PROCEDURE REPORT  PATIENT:  Jill, White  MR#:  [id number] BIRTHDATE:  10-Jan-1941, 70 yrs. old  GENDER:  female  ENDOSCOPIST:  Dorena Cookey Referred by:  PROCEDURE DATE:  07/09/2011 PROCEDURE: ASA CLASS: INDICATIONS:  right upper quadrant abdominal pain  MEDICATIONS: Fentanyl 50 mcg, Versed 4 mg TOPICAL ANESTHETIC: Cetacaine spray  DESCRIPTION OF PROCEDURE:   After the risks benefits and alternatives of the procedure were thoroughly explained, informed consent was obtained.  The  endoscope was introduced through the mouth and advanced to the , without limitations.  The instrument was slowly withdrawn as the mucosa was fully examined. <<PROCEDUREIMAGES>>  FINDINGS:  Prepyloric antral erosion versus minimal shallow ulcer. Biopsies were taken to rule out H. pyloric  COMPLICATIONS:  ENDOSCOPIC IMPRESSION: Antral erosion/ulcer  RECOMMENDATIONS: Treat with proton pump inhibitor and treat for eradication of Helicobacter if positive.  REPEAT EXAM:  No  ______________________________ Dorena Cookey  CC:  n. eSIGNED:   Dorena Cookey at 07/09/2011 10:46 AM  Skeet Latch, [phone]

## 2011-07-09 NOTE — Brief Op Note (Signed)
07/06/2011 - 07/09/2011  10:47 AM  PATIENT:  Jill White  71 y.o. female  PRE-OPERATIVE DIAGNOSIS:  ABD pain  POST-OPERATIVE DIAGNOSIS:  antral erosion  PROCEDURE:  Procedure(s): ESOPHAGOGASTRODUODENOSCOPY (EGD)  SURGEON:  Surgeon(s): Barrie Folk, MD  Small antral erosion with exudate versus borderline shallow ulcer. Please see official procedure report.

## 2011-07-10 DIAGNOSIS — R109 Unspecified abdominal pain: Secondary | ICD-10-CM

## 2011-07-10 LAB — BASIC METABOLIC PANEL
CO2: 32 mEq/L (ref 19–32)
Calcium: 8.7 mg/dL (ref 8.4–10.5)
Creatinine, Ser: 0.53 mg/dL (ref 0.50–1.10)
GFR calc Af Amer: >90 mL/min (ref >90)
GFR calc non Af Amer: >90 mL/min (ref >90)
Sodium: 132 mEq/L — ABNORMAL LOW (ref 135–145)

## 2011-07-10 MED ORDER — SUCRALFATE 1 GM/10ML PO SUSP
1.0000 g | Freq: Three times a day (TID) | ORAL | Status: DC
  Administered 2011-07-10 – 2011-07-12 (×9): 1 g via ORAL
  Filled 2011-07-10 (×14): qty 10

## 2011-07-10 NOTE — Consult Note (Signed)
Chief Complaint:  Midepigastric pain radiating into the back, worse after eating  History of Present Illness:  Jill White is an 71 y.o. female who has been hospitalized and workup since the onset of nausea about 5 days ago. She had a CT scan performed which showed no evidence of biliary pathology. Was some questionable liver lesions for which she underwent an MRI scan and this showed no evidence of any biliary pathology. She had a CCK hydroscan which showed prompt filling of her gallbladder and an ejection fraction over 40%. She did report some pain with CCK administration.  An endoscopy was performed by Dr. Madilyn Fireman who saw very small exudative lesion in the antrum that was biopsied thought to represent antral erosion. A CT scan suggested she might have some: Thickening and diverticulitis area she reports chronic diarrhea. She is a heavy smoker and does drink.  She had a recent amylase  Amylase    Component Value Date/Time   AMYLASE 49 07/07/2011 0445    And showed no inflammatory changes on her CT scan suggestive of pancreatitis   Past Medical History  Diagnosis Date  . HIP PAIN 03/06/2007  . HYPERTENSION 02/09/2007  . Restless leg syndrome   . COPD (chronic obstructive pulmonary disease)   . Complication of anesthesia     Past Surgical History  Procedure Date  . Abdominal hysterectomy   . Dilation and curettage of uterus   . Tonsillectomy     Current Facility-Administered Medications  Medication Dose Route Frequency Provider Last Rate Last Dose  . acetaminophen (TYLENOL) tablet 650 mg  650 mg Oral Q6H PRN Debby Crosley, MD       Or  . acetaminophen (TYLENOL) suppository 650 mg  650 mg Rectal Q6H PRN Debby Crosley, MD      . ipratropium (ATROVENT) nebulizer solution 0.5 mg  0.5 mg Nebulization Q4H PRN Debby Crosley, MD       And  . albuterol (PROVENTIL) (5 MG/ML) 0.5% nebulizer solution 2.5 mg  2.5 mg Nebulization Q4H PRN Debby Crosley, MD      . alum & mag hydroxide-simeth  (MAALOX/MYLANTA) 200-200-20 MG/5ML suspension 30 mL  30 mL Oral Q6H PRN Debby Crosley, MD      . amLODipine (NORVASC) tablet 5 mg  5 mg Oral Daily Debby Crosley, MD   5 mg at 07/10/11 0955  . aspirin chewable tablet 81 mg  81 mg Oral Daily Debby Crosley, MD   81 mg at 07/10/11 0955  . clonazePAM (KLONOPIN) tablet 0.5 mg  0.5 mg Oral QHS Debby Crosley, MD   0.5 mg at 07/09/11 2132  . diphenhydrAMINE (BENADRYL) 12.5 MG/5ML elixir 25 mg  25 mg Oral QID PRN Debby Crosley, MD   25 mg at 07/10/11 1220  . enoxaparin (LOVENOX) injection 40 mg  40 mg Subcutaneous Q24H Debby Crosley, MD   40 mg at 07/10/11 0840  . furosemide (LASIX) injection 40 mg  40 mg Intravenous Once Zannie Cove, MD   40 mg at 07/10/11 0840  . HYDROcodone-acetaminophen (NORCO) 5-325 MG per tablet 1-2 tablet  1-2 tablet Oral Q4H PRN Gery Pray, MD   2 tablet at 07/09/11 1522  . losartan (COZAAR) tablet 50 mg  50 mg Oral Daily Lorenza Evangelist, PHARMD   50 mg at 07/10/11 0955  . morphine 2 MG/ML injection 2 mg  2 mg Intravenous Q4H PRN Debby Crosley, MD   2 mg at 07/10/11 1220  . nicotine (NICODERM CQ - dosed in mg/24 hours) patch  14 mg  14 mg Transdermal Daily Debby Crosley, MD   14 mg at 07/10/11 0957  . ondansetron (ZOFRAN) tablet 4 mg  4 mg Oral Q6H PRN Debby Crosley, MD   4 mg at 07/10/11 0840  . pantoprazole (PROTONIX) injection 40 mg  40 mg Intravenous Q12H Zannie Cove, MD   40 mg at 07/10/11 0957  . pneumococcal 23 valent vaccine (PNU-IMMUNE) injection 0.5 mL  0.5 mL Intramuscular Prior to discharge Hurman Horn, MD      . sertraline (ZOLOFT) tablet 50 mg  50 mg Oral Daily Debby Crosley, MD   50 mg at 07/10/11 0957  . sucralfate (CARAFATE) 1 GM/10ML suspension 1 g  1 g Oral TID WC & HS Valarie Merino, MD       Tetanus toxoid; Codeine; Erythromycin; Meloxicam; Penicillins; and Prednisone Family History  Problem Relation Age of Onset  . Heart disease Sister   . Heart disease Brother    Social History:   reports  that she has been smoking Cigarettes.  She has been smoking about .5 packs per day. She has never used smokeless tobacco. She reports that she drinks about .5 ounces of alcohol per week. She reports that she does not use illicit drugs.   REVIEW OF SYSTEMS - PERTINENT POSITIVES ONLY:   Physical Exam:   Blood pressure 108/64, pulse 67, temperature 98.4 F (36.9 C), temperature source Oral, resp. rate 16, height 5\' 3"  (1.6 m), weight 129 lb (58.514 kg), SpO2 92.00%. Body mass index is 22.85 kg/(m^2).  Gen:  WDWN  white female NAD  Neurological: Alert and oriented to person, place, and time. Motor and sensory function is grossly intact  Head: Normocephalic and atraumatic.  Eyes: Conjunctivae are normal. Pupils are equal, round, and reactive to light. No scleral icterus.  Neck: Normal range of motion. Neck supple. No tracheal deviation or thyromegaly present.   Respiratory: Effort normal.  No respiratory distress. No chest wall tenderness.  Abdomen:   nontender at this time. Soft and flat.  GU: Musculoskeletal: Normal range of motion. Extremities are nontender. No cyanosis, edema or clubbing noted Lymphadenopathy: No cervical, preauricular, postauricular or axillary adenopathy is present Skin: Skin is warm and dry. No rash noted. No diaphoresis. No erythema. No pallor. Pscyh: Normal mood and affect. Behavior is normal. Judgment and thought content normal.   LABORATORY RESULTS: Results for orders placed during the hospital encounter of 07/06/11 (from the past 48 hour(s))  BASIC METABOLIC PANEL     Status: Abnormal   Collection Time   07/10/11  4:16 AM      Component Value Range Comment   Sodium 132 (*) 135 - 145 (mEq/L)    Potassium 3.5  3.5 - 5.1 (mEq/L)    Chloride 94 (*) 96 - 112 (mEq/L)    CO2 32  19 - 32 (mEq/L)    Glucose, Bld 94  70 - 99 (mg/dL)    BUN 7  6 - 23 (mg/dL)    Creatinine, Ser 1.61  0.50 - 1.10 (mg/dL)    Calcium 8.7  8.4 - 10.5 (mg/dL)    GFR calc non Af Amer >90   >90 (mL/min)    GFR calc Af Amer >90  >90 (mL/min)     RADIOLOGY RESULTS: No results found.  Problem List: Patient Active Problem List  Diagnoses  . HYPERTENSION  . HIP PAIN  . Hyponatremia  . Abdominal pain  . RLS (restless legs syndrome)  . Tobacco abuse  . COPD (  chronic obstructive pulmonary disease)    Assessment & Plan:  in view of the evidence of think he would be beneficial to try her on a course of sucrafate and wait for her H. pylori biopsy to return. Location of her pain and her symptoms in the absence of objective data pointing to her gallbladder as the culprit make  me inclined to look elsewhere before recommending a laparoscopic cholecystectomy. She'll be followed by the service.    Matt B. Daphine Deutscher, MD, [facility] [phone] beeper [phone]  07/10/2011 3:58 PM

## 2011-07-10 NOTE — Progress Notes (Signed)
[facility] Progress Note  Subjective: Patient continues to complain of postprandial epigastric pain and nausea and states that this pain is similar to the pain which she experienced during the injection the hiatus scan.  Objective: Vital signs in last 24 hours: Temp:  [97.9 F (36.6 C)-98.2 F (36.8 C)] 98.2 F (36.8 C) (01/20 0415) Pulse Rate:  [64-79] 64  (01/20 0415) Resp:  [16-30] 20  (01/20 0415) BP: (121-179)/(60-97) 171/65 mmHg (01/20 0415) SpO2:  [87 %-97 %] 92 % (01/20 0415) Weight change:    PE: Unchanged  Lab Results: Results for orders placed during the hospital encounter of 07/06/11 (from the past 24 hour(s))  BASIC METABOLIC PANEL     Status: Abnormal   Collection Time   07/10/11  4:16 AM      Component Value Range   Sodium 132 (*) 135 - 145 (mEq/L)   Potassium 3.5  3.5 - 5.1 (mEq/L)   Chloride 94 (*) 96 - 112 (mEq/L)   CO2 32  19 - 32 (mEq/L)   Glucose, Bld 94  70 - 99 (mg/dL)   BUN 7  6 - 23 (mg/dL)   Creatinine, Ser 1.61  0.50 - 1.10 (mg/dL)   Calcium 8.7  8.4 - 09.6 (mg/dL)   GFR calc non Af Amer >90  >90 (mL/min)   GFR calc Af Amer >90  >90 (mL/min)    Studies/Results: Nm Hepato W/eject Fract  07/08/2011  *RADIOLOGY REPORT*  Clinical Data:  Right upper quadrant pain.  NUCLEAR MEDICINE HEPATOBILIARY IMAGING WITH GALLBLADDER EF  Technique:  Sequential images of the abdomen were obtained out to 60 minutes following intravenous administration of radiopharmaceutical.  After slow intravenous infusion of 1.2 micrograms Cholecystokinin, gallbladder ejection fraction was determined.  Radiopharmaceutical:  5.4 mCi Tc-33m Choletec  Comparison:  MR abdomen 07/07/2011.  Findings: Homogeneous uptake by the liver.  Gallbladder is visualized by 12 minutes.  Small bowel not visualized in the first 60 minutes.  Small bowel visualized on initial imaging after injection of CCK.  Calculated gallbladder ejection fraction of 46.7% which is within the range normal limits  (greater than 30%)  The patient did experience symptoms during CCK infusion.  IMPRESSION: Normal gallbladder ejection fraction.  The patient experienced pain with injection of CCK.  Original Report Authenticated By: Fuller Canada, M.D.      Assessment: Abdominal pain with borderline small antral ulcer/erosion on EGD, doubt responsible for her pain  Plan: At this point I think it is worth surgical consultation if hospital team degrees to address eventual cholecystectomy given reproduction of pain with CCK injection. Awaiting H. pylori status but probably would not influence decision regarding cholecystectomy.    Jill White C 07/10/2011, 9:55 AM

## 2011-07-10 NOTE — Progress Notes (Signed)
Subjective: RUQ still hurting with oral intake. No emesis. Nausea  Objective: Vital signs in last 24 hours: Temp:  [97.9 F (36.6 C)-98.4 F (36.9 C)] 98.4 F (36.9 C) (01/20 1340) Pulse Rate:  [64-70] 67  (01/20 1340) Resp:  [16-20] 16  (01/20 1340) BP: (108-171)/(64-72) 108/64 mmHg (01/20 1340) SpO2:  [90 %-95 %] 92 % (01/20 1340) Weight change:  Last BM Date: 07/09/11  Intake/Output from previous day: 01/19 0701 - 01/20 0700 In: 450  Out: 500 [Urine:500] Total I/O In: 480 [P.O.:480] Out: 1550 [Urine:1550] Physical Exam: General: Alert, awake, oriented x3, in no acute distress. HEENT: No bruits, no goiter. Heart: Regular rate and rhythm, without murmurs, rubs, gallops. Lungs: Clear to auscultation bilaterally. Abdomen: Soft, mildly tender in RUQ, and epigastrium, nondistended, positive bowel sounds. Extremities: No clubbing cyanosis or edema with positive pedal pulses.  Lab Results: Basic Metabolic Panel:  Basename 07/10/11 0416 07/08/11 0340  NA 132* 132*  K 3.5 3.8  CL 94* 100  CO2 32 27  GLUCOSE 94 82  BUN 7 5*  CREATININE 0.53 0.53  CALCIUM 8.7 8.1*  MG -- --  PHOS -- --   Liver Function Tests: No results found for this basename: AST:2,ALT:2,ALKPHOS:2,BILITOT:2,PROT:2,ALBUMIN:2 in the last 72 hours No results found for this basename: LIPASE:2,AMYLASE:2 in the last 72 hours No results found for this basename: AMMONIA:2 in the last 72 hours CBC:  Basename 07/08/11 0340  WBC 6.0  NEUTROABS --  HGB 11.9*  HCT 35.4*  MCV 96.5  PLT 278   Cardiac Enzymes: No results found for this basename: CKTOTAL:3,CKMB:3,CKMBINDEX:3,TROPONINI:3 in the last 72 hours BNP: No results found for this basename: PROBNP:3 in the last 72 hours D-Dimer: No results found for this basename: DDIMER:2 in the last 72 hours CBG: No results found for this basename: GLUCAP:6 in the last 72 hours Hemoglobin A1C: No results found for this basename: HGBA1C in the last 72  hours Fasting Lipid Panel: No results found for this basename: CHOL,HDL,LDLCALC,TRIG,CHOLHDL,LDLDIRECT in the last 72 hours Thyroid Function Tests: No results found for this basename: TSH,T4TOTAL,FREET4,T3FREE,THYROIDAB in the last 72 hours Anemia Panel: No results found for this basename: VITAMINB12,FOLATE,FERRITIN,TIBC,IRON,RETICCTPCT in the last 72 hours Coagulation: No results found for this basename: LABPROT:2,INR:2 in the last 72 hours Urine Drug Screen: Drugs of Abuse  No results found for this basename: labopia,  cocainscrnur,  labbenz,  amphetmu,  thcu,  labbarb    Alcohol Level: No results found for this basename: ETH:2 in the last 72 hours  No results found for this or any previous visit (from the past 240 hour(s)).  Studies/Results: No results found.  Medications: Scheduled Meds:    . amLODipine  5 mg Oral Daily  . aspirin  81 mg Oral Daily  . clonazePAM  0.5 mg Oral QHS  . enoxaparin  40 mg Subcutaneous Q24H  . furosemide  40 mg Intravenous Once  . losartan  50 mg Oral Daily  . nicotine  14 mg Transdermal Daily  . pantoprazole (PROTONIX) IV  40 mg Intravenous Q12H  . sertraline  50 mg Oral Daily   Continuous Infusions:   PRN Meds:.acetaminophen, acetaminophen, albuterol, alum & mag hydroxide-simeth, diphenhydrAMINE, HYDROcodone-acetaminophen, ipratropium, morphine, ondansetron, pneumococcal 23 valent vaccine  Assessment/Plan: 1. RUQ abd pain :  Unknown etiology. Ulcer vs Gallbladder pathology. Workup Negative so far, HIDA scan shows a normal gallbladder ejection fraction,however with abdominal pain with CCK infusion. Continue PPI. ???sucrafate will defer to GI. CCS consult for further eval and rxcs.biopsy pending.  2.Small prepyloric ulcer Per EGD. PPI. Biopsy pending. Per GI 3. Diverticulosis with mild mucosal thickening of sigmoid colon: unclear significance, possibly gastroenteritis vs early diverticulitis Advance diet, afebrile. Antibiotics d/c'd  yesterday. Will need a colonoscopy at some point 4. 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 COPD : stable 6. Hyponatremia: due to dehydration and HCTZ, improving with IVF 7. DVT prophylaxis: lovenox   LOS: 4 days   [facility] Pager: [phone] 07/10/2011, 2:57 PM

## 2011-07-11 ENCOUNTER — Encounter (HOSPITAL_COMMUNITY): Payer: Self-pay | Admitting: Gastroenterology

## 2011-07-11 LAB — CBC
MCH: 32.3 pg (ref 26.0–34.0)
Platelets: 293 10*3/uL (ref 150–400)
RBC: 4.21 MIL/uL (ref 3.87–5.11)

## 2011-07-11 LAB — BASIC METABOLIC PANEL
Calcium: 8.6 mg/dL (ref 8.4–10.5)
GFR calc Af Amer: >90 mL/min (ref >90)
GFR calc non Af Amer: >90 mL/min (ref >90)
Glucose, Bld: 86 mg/dL (ref 70–99)
Sodium: 132 mEq/L — ABNORMAL LOW (ref 135–145)

## 2011-07-11 MED ORDER — POTASSIUM CHLORIDE CRYS ER 20 MEQ PO TBCR
40.0000 meq | EXTENDED_RELEASE_TABLET | Freq: Once | ORAL | Status: AC
  Administered 2011-07-11: 40 meq via ORAL
  Filled 2011-07-11: qty 2

## 2011-07-11 NOTE — Progress Notes (Signed)
Treat ulcer for now.  IF no better,  Consider lap chole in future.  Can DC from surgery standpoint.

## 2011-07-11 NOTE — Progress Notes (Signed)
Patient ID: Jill White, female   DOB: [birth year], 71 y.o.   MRN: [id number] 2 Days Post-Op  Subjective: Pt feels ok this am.  Still having some RUQ and epigastric tenderness.  Eating solid diet.  Some nausea yesterday, but no emesis  Objective: Vital signs in last 24 hours: Temp:  [97.9 F (36.6 C)-98.4 F (36.9 C)] 98 F (36.7 C) (01/21 0450) Pulse Rate:  [53-67] 53  (01/21 0450) Resp:  [16-18] 18  (01/21 0450) BP: (108-169)/(64-73) 169/67 mmHg (01/21 0450) SpO2:  [91 %-93 %] 91 % (01/21 0450) Last BM Date: 07/10/11  Intake/Output from previous day: 01/20 0701 - 01/21 0700 In: 480 [P.O.:480] Out: 1800 [Urine:1800] Intake/Output this shift:    PE: Abd: soft, tender in RUQ and epigastrum, along with some mild lower abdominal tenderness.  ND, +BS Ht: regular Lungs: CTAB  Lab Results:   Basename 07/11/11 0421  WBC 5.5  HGB 13.6  HCT 40.6  PLT 293   BMET  Basename 07/11/11 0421 07/10/11 0416  NA 132* 132*  K 3.3* 3.5  CL 92* 94*  CO2 32 32  GLUCOSE 86 94  BUN 10 7  CREATININE 0.57 0.53  CALCIUM 8.6 8.7   PT/INR No results found for this basename: LABPROT:2,INR:2 in the last 72 hours   Studies/Results: No results found.  Anti-infectives: Anti-infectives     Start     Dose/Rate Route Frequency Ordered Stop   07/07/11 0300   ciprofloxacin (CIPRO) IVPB 200 mg  Status:  Discontinued        200 mg 100 mL/hr over 60 Minutes Intravenous Every 12 hours 07/07/11 0208 07/09/11 1201   07/07/11 0215   metroNIDAZOLE (FLAGYL) IVPB 500 mg  Status:  Discontinued        500 mg 100 mL/hr over 60 Minutes Intravenous Every 8 hours 07/07/11 0208 07/09/11 1201           Assessment/Plan  1. Gastric antral ulcer 2. Pain with CCK infusion  Plan: 1. Await bx for H. Pylori 2. Treat ulcer disease first, if this does not resolve abdominal discomfort then she may need a lap chole at some point in the future. 3. Further PUD tx per GI   LOS: 5 days    Brennah Quraishi  E 07/11/2011

## 2011-07-11 NOTE — Progress Notes (Signed)
Subjective: Patient still complaining of RUQ and epigastric pain with oral intake and not improved. Patient and family frustrated as no definite etiology of her pain.  Objective: Vital signs in last 24 hours: Temp:  [97.9 F (36.6 C)-98 F (36.7 C)] 98 F (36.7 C) (01/21 1400) Pulse Rate:  [53-60] 60  (01/21 1400) Resp:  [17-18] 18  (01/21 1400) BP: (122-169)/(67-73) 122/70 mmHg (01/21 1400) SpO2:  [83 %-93 %] 83 % (01/21 1400) Weight change:  Last BM Date: 07/10/11  Intake/Output from previous day: 01/20 0701 - 01/21 0700 In: 480 [P.O.:480] Out: 1800 [Urine:1800]   Physical Exam: General: Alert, awake, oriented x3, in no acute distress. HEENT: No bruits, no goiter. Heart: Regular rate and rhythm, without murmurs, rubs, gallops. Lungs: Clear to auscultation bilaterally. Abdomen: Soft, mildly tender in RUQ, and epigastrium, nondistended, positive bowel sounds. Extremities: No clubbing cyanosis or edema with positive pedal pulses.  Lab Results: Basic Metabolic Panel:  Basename 07/11/11 0421 07/10/11 0416  NA 132* 132*  K 3.3* 3.5  CL 92* 94*  CO2 32 32  GLUCOSE 86 94  BUN 10 7  CREATININE 0.57 0.53  CALCIUM 8.6 8.7  MG -- --  PHOS -- --   Liver Function Tests: No results found for this basename: AST:2,ALT:2,ALKPHOS:2,BILITOT:2,PROT:2,ALBUMIN:2 in the last 72 hours No results found for this basename: LIPASE:2,AMYLASE:2 in the last 72 hours No results found for this basename: AMMONIA:2 in the last 72 hours CBC:  Basename 07/11/11 0421  WBC 5.5  NEUTROABS --  HGB 13.6  HCT 40.6  MCV 96.4  PLT 293   Cardiac Enzymes: No results found for this basename: CKTOTAL:3,CKMB:3,CKMBINDEX:3,TROPONINI:3 in the last 72 hours BNP: No results found for this basename: PROBNP:3 in the last 72 hours D-Dimer: No results found for this basename: DDIMER:2 in the last 72 hours CBG: No results found for this basename: GLUCAP:6 in the last 72 hours Hemoglobin A1C: No results  found for this basename: HGBA1C in the last 72 hours Fasting Lipid Panel: No results found for this basename: CHOL,HDL,LDLCALC,TRIG,CHOLHDL,LDLDIRECT in the last 72 hours Thyroid Function Tests: No results found for this basename: TSH,T4TOTAL,FREET4,T3FREE,THYROIDAB in the last 72 hours Anemia Panel: No results found for this basename: VITAMINB12,FOLATE,FERRITIN,TIBC,IRON,RETICCTPCT in the last 72 hours Coagulation: No results found for this basename: LABPROT:2,INR:2 in the last 72 hours Urine Drug Screen: Drugs of Abuse  No results found for this basename: labopia,  cocainscrnur,  labbenz,  amphetmu,  thcu,  labbarb    Alcohol Level: No results found for this basename: ETH:2 in the last 72 hours  No results found for this or any previous visit (from the past 240 hour(s)).  Studies/Results: No results found.  Medications: Scheduled Meds:    . amLODipine  5 mg Oral Daily  . aspirin  81 mg Oral Daily  . clonazePAM  0.5 mg Oral QHS  . enoxaparin  40 mg Subcutaneous Q24H  . losartan  50 mg Oral Daily  . nicotine  14 mg Transdermal Daily  . pantoprazole (PROTONIX) IV  40 mg Intravenous Q12H  . potassium chloride  40 mEq Oral Once  . sertraline  50 mg Oral Daily  . sucralfate  1 g Oral TID WC & HS   Continuous Infusions:   PRN Meds:.acetaminophen, acetaminophen, albuterol, alum & mag hydroxide-simeth, diphenhydrAMINE, HYDROcodone-acetaminophen, ipratropium, morphine, ondansetron, pneumococcal 23 valent vaccine  Assessment/Plan: 1. RUQ abd pain :  Unknown etiology. Ulcer vs Gallbladder pathology. Workup Negative so far, HIDA scan shows a normal gallbladder ejection  fraction,however with abdominal pain with CCK infusion. Continue PPI.sucrafate added for CCS. biopsy pending. Patient and family donot want patient to go home without etiology and pt states she's had ulcer pain and her current pain is different. GI and CCS ff and appreciate input and rxcs. 2.Small prepyloric ulcer Per  EGD. PPI. Biopsy pending. Sucrafate added to regimen.Per GI 3. Diverticulosis with mild mucosal thickening of sigmoid colon: unclear significance, possibly gastroenteritis vs early diverticulitis Advance diet, afebrile. Antibiotics d/c'd yesterday. Will need a colonoscopy at some point 4. 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 COPD : stable 6. Hyponatremia: due to dehydration and HCTZ, improving with IVF 7. DVT prophylaxis: lovenox   LOS: 5 days   [facility] Pager: [phone] 07/11/2011, 4:37 PM

## 2011-07-12 ENCOUNTER — Other Ambulatory Visit (HOSPITAL_COMMUNITY): Payer: Medicare Other

## 2011-07-12 ENCOUNTER — Inpatient Hospital Stay (HOSPITAL_COMMUNITY): Payer: Medicare Other

## 2011-07-12 DIAGNOSIS — K269 Duodenal ulcer, unspecified as acute or chronic, without hemorrhage or perforation: Secondary | ICD-10-CM | POA: Diagnosis present

## 2011-07-12 LAB — COMPREHENSIVE METABOLIC PANEL
ALT: 45 U/L — ABNORMAL HIGH (ref 0–35)
AST: 57 U/L — ABNORMAL HIGH (ref 0–37)
CO2: 29 mEq/L (ref 19–32)
Calcium: 9 mg/dL (ref 8.4–10.5)
GFR calc non Af Amer: >90 mL/min (ref >90)
Potassium: 3.7 mEq/L (ref 3.5–5.1)
Sodium: 131 mEq/L — ABNORMAL LOW (ref 135–145)
Total Protein: 6.1 g/dL (ref 6.0–8.3)

## 2011-07-12 LAB — CBC
HCT: 41.4 % (ref 36.0–46.0)
Hemoglobin: 14.2 g/dL (ref 12.0–15.0)
MCH: 32.6 pg (ref 26.0–34.0)
MCHC: 34.3 g/dL (ref 30.0–36.0)

## 2011-07-12 LAB — SODIUM, URINE, RANDOM: Sodium, Ur: 98 mEq/L

## 2011-07-12 MED ORDER — PANTOPRAZOLE SODIUM 40 MG PO TBEC: 40.0000 mg | DELAYED_RELEASE_TABLET | Freq: Two times a day (BID) | ORAL | Status: DC

## 2011-07-12 MED ORDER — PNEUMOCOCCAL VAC POLYVALENT 25 MCG/0.5ML IJ INJ
0.5000 mL | INJECTION | Freq: Once | INTRAMUSCULAR | Status: AC
  Administered 2011-07-12: 0.5 mL via INTRAMUSCULAR
  Filled 2011-07-12: qty 0.5

## 2011-07-12 MED ORDER — ONDANSETRON HCL 4 MG PO TABS: 4.0000 mg | ORAL_TABLET | Freq: Four times a day (QID) | ORAL | Status: AC | PRN

## 2011-07-12 MED ORDER — SUCRALFATE 1 GM/10ML PO SUSP: 1.0000 g | Freq: Three times a day (TID) | ORAL | Status: DC

## 2011-07-12 MED ORDER — GADOBENATE DIMEGLUMINE 529 MG/ML IV SOLN: 12.0000 mL | Freq: Once | INTRAVENOUS | Status: DC | PRN

## 2011-07-12 MED ORDER — HYDROCODONE-ACETAMINOPHEN 5-325 MG PO TABS: 1.0000 | ORAL_TABLET | ORAL | Status: AC | PRN

## 2011-07-12 NOTE — Progress Notes (Signed)
[facility] Progress Note  Subjective: Still having predictable postprandial epigastric to right upper quadrant abdominal pain unchanged in requiring narcotic analgesics  Objective: Vital signs in last 24 hours: Temp:  [97.6 F (36.4 C)-98.4 F (36.9 C)] 97.6 F (36.4 C) (01/22 0505) Pulse Rate:  [59-64] 59  (01/22 0505) Resp:  [18-20] 20  (01/22 0505) BP: (122-167)/(69-73) 167/69 mmHg (01/22 0505) SpO2:  [83 %-92 %] 90 % (01/22 0505) Weight change:    PE: Unchanged  Lab Results: Results for orders placed during the hospital encounter of 07/06/11 (from the past 24 hour(s))  COMPREHENSIVE METABOLIC PANEL     Status: Abnormal   Collection Time   07/12/11  4:15 AM      Component Value Range   Sodium 131 (*) 135 - 145 (mEq/L)   Potassium 3.7  3.5 - 5.1 (mEq/L)   Chloride 96  96 - 112 (mEq/L)   CO2 29  19 - 32 (mEq/L)   Glucose, Bld 89  70 - 99 (mg/dL)   BUN 8  6 - 23 (mg/dL)   Creatinine, Ser 1.61  0.50 - 1.10 (mg/dL)   Calcium 9.0  8.4 - 09.6 (mg/dL)   Total Protein 6.1  6.0 - 8.3 (g/dL)   Albumin 2.9 (*) 3.5 - 5.2 (g/dL)   AST 57 (*) 0 - 37 (U/L)   ALT 45 (*) 0 - 35 (U/L)   Alkaline Phosphatase 56  39 - 117 (U/L)   Total Bilirubin 0.3  0.3 - 1.2 (mg/dL)   GFR calc non Af Amer >90  >90 (mL/min)   GFR calc Af Amer >90  >90 (mL/min)  CBC     Status: Normal   Collection Time   07/12/11  4:15 AM      Component Value Range   WBC 6.0  4.0 - 10.5 (K/uL)   RBC 4.35  3.87 - 5.11 (MIL/uL)   Hemoglobin 14.2  12.0 - 15.0 (g/dL)   HCT 04.5  40.9 - 81.1 (%)   MCV 95.2  78.0 - 100.0 (fL)   MCH 32.6  26.0 - 34.0 (pg)   MCHC 34.3  30.0 - 36.0 (g/dL)   RDW 91.4  78.2 - 95.6 (%)   Platelets 324  150 - 400 (K/uL)    Studies/Results: Biopsies from EGD showed mild gastritis with no H. pylori   Assessment: 1. Postprandial abdominal pain etiology still unclear 2. Small duodenal ulcer  Plan: 1. Continue PPI and Carafate, no role for antibiotics 2. Await MR angiogram  ordered to rule out mesenteric ischemia 3. If negative, discharge on PPI plus Carafate with followup with me in 2 or 3 weeks. 4. If all studies negative and no improvement refer back to surgery for possible cholecystectomy    Shylyn Younce C 07/12/2011, 10:35 AM

## 2011-07-12 NOTE — Discharge Summary (Signed)
Discharge Summary  Jill White MR#: [id number]  DOB:1941/03/13  Date of Admission: 07/06/2011 Date of Discharge: 07/12/2011  Patient's PCP: Rogelia Boga, MD, MD  Attending Physician:THOMPSON,DANIEL  Consults:   #1 gastroenterology: Barrie Folk, MD #2 Gen. surgery: Dr. Daphine Deutscher   Discharge Diagnoses: Abdominal pain Present on Admission:  .Hyponatremia .Abdominal pain .HYPERTENSION .Duodenal ulcer   Brief Admitting History and Physical HPI:  This is a 71 year old female who states she developed right upper quadrant pain yesterday at 6:30 PM. She describes it as sharp, and constant. Ranked it as 8/10. Reports nausea but no vomiting, no fevers, no chills. She states the pain radiated to the back then down the left. She has no personal history of gallbladder disease but states old woman her family have had gallbladder removed. She was concerned and came to the ER. In the ER the patient received Dilaudid and morphine. She is currently itching quite a bit and requested Benadryl, however she states her abdominal pain is improved. History provided by patient. Her 2 daughters who are both her power of attorney are at her bedside..  Review of Systems:  anorexia, fever, weight loss,, vision loss, decreased hearing, hoarseness, chest pain, syncope, dyspnea on exertion, peripheral edema, balance deficits, hemoptysis, abdominal pain, melena, hematochezia, severe indigestion/heartburn, hematuria, incontinence, genital sores, muscle weakness, suspicious skin lesions, transient blindness, difficulty walking, depression, unusual weight change, abnormal bleeding, enlarged lymph nodes, angioedema, and breast masses. For the rest of admission history and physical please see H&P dictated by Dr. Joneen Roach.   Discharge Medications Medication List  As of 07/12/2011  4:47 PM   START taking these medications         HYDROcodone-acetaminophen 5-325 MG per tablet   Commonly known as: NORCO   Take  1-2 tablets by mouth every 4 (four) hours as needed.      ondansetron 4 MG tablet   Commonly known as: ZOFRAN   Take 1 tablet (4 mg total) by mouth every 6 (six) hours as needed for nausea.      pantoprazole 40 MG tablet   Commonly known as: PROTONIX   Take 1 tablet (40 mg total) by mouth 2 (two) times daily.      sucralfate 1 GM/10ML suspension   Commonly known as: CARAFATE   Take 10 mLs (1 g total) by mouth 4 (four) times daily -  with meals and at bedtime.         CONTINUE taking these medications         amLODipine 5 MG tablet   Commonly known as: NORVASC   Take 1 tablet (5 mg total) by mouth daily.      aspirin 81 MG tablet      calcium-vitamin D 500-200 MG-UNIT per tablet   Commonly known as: OSCAL WITH D      clonazePAM 0.5 MG tablet   Commonly known as: KLONOPIN   Take 1 tablet (0.5 mg total) by mouth at bedtime.      diphenhydrAMINE 12.5 MG/5ML elixir   Commonly known as: BENADRYL      losartan-hydrochlorothiazide 50-12.5 MG per tablet   Commonly known as: HYZAAR   Take 1 tablet by mouth daily.      sertraline 50 MG tablet   Commonly known as: ZOLOFT   Take 1 tablet (50 mg total) by mouth daily.      traMADol 50 MG tablet   Commonly known as: ULTRAM      vitamin E 100 UNIT capsule  Where to get your medications    These are the prescriptions that you need to pick up.   You may get these medications from any pharmacy.         HYDROcodone-acetaminophen 5-325 MG per tablet   ondansetron 4 MG tablet   pantoprazole 40 MG tablet   sucralfate 1 GM/10ML suspension           Hospital Course: Abdominal pain Patient was admitted with abdominal pain with unknown etiology. CT scan which was done did show some mucosal thickening noted him on along the sigmoid colon in the region of that he is diverticulosis and a such there was a question of probable infectious etiology. Patient was subsequently placed on empiric IV Cipro and Flagyl for probable  diverticulitis and admitted to the floor. Patient was placed on supportive care as well as IV fluids and pain medication. A GI consultation was subsequently obtained and patient was seen in consultation by Dr. Evette Cristal [facility] on 07/08/2011. CT scan which was done noted some focal nodular hyperplasia of the liver and a 3 to six-month followup was recommended. Patient initially was n.p.o. had diet was subsequently advanced to a full liquid diet and the EEG was recommended to rule out peptic ulcer disease. Patient subsequently had a upper endoscopy which was done by Dr. Madilyn Fireman on 07/09/2011 which showed some prepyloric antral erosion versus minimal shallow ulcer biopsies were obtained were pending at the time of discharge. Patient was placed on a proton pump inhibitor as well as Carafate. Patient continued to have postprandial abdominal pain. Abdominal ultrasound which was done during this hospitalization was negative for acute cholecystitis. A hiatus scan was subsequently obtained which had a normal ejection fraction however produced abdominal pain with CCK was infused. There was a question of possible biliary colic versus gallbladder disease. A general surgical consultation was subsequently obtained and patient was seen in consultation by Dr. Daphine Deutscher of [facility] [place] [facility] on 07/10/2011. It was felt per Gen. surgery that it'll be beneficial to [facility] subsequently with a proton pump inhibitor while awaiting biopsy results and monitor. It was felt that patient needed to give this treatment a few weeks prior to recommendation for possible laparoscopic cholecystectomy. Patient continued to have some postprandial abdominal pain and subsequently at MRA of the abdomen was obtained which was negative for mesenteric ischemia. Patient was able to tolerate oral intake with some improvement in abdominal pain patient did not have any emesis but did have some bouts of nausea. It was decided to give a trial of  proton pump inhibitor and Carafate for treatment of her ulcer for couple weeks and to followup with Dr. Madilyn Fireman of gastroenterology as outpatient if patient was still symptomatic after treatment of ulcer disease for approximately 2 weeks she'll subsequently be referred to Gen. surgery for possible laparoscopic cholecystectomy. Patient will be discharged home in stable and improved condition.  Present on Admission:  .Hyponatremia .Abdominal pain .HYPERTENSION .Duodenal ulcer   Day of Discharge BP 155/76  Pulse 67  Temp(Src) 98 F (36.7 C) (Oral)  Resp 18  Ht 5\' 3"  (1.6 m)  Wt 58.514 kg (129 lb)  BMI 22.85 kg/m2  SpO2 91% General: Alert, awake, oriented x3, in no acute distress. Heart: Regular rate and rhythm, without murmurs, rubs, gallops. Lungs: Clear to auscultation bilaterally. Abdomen: Soft, some tenderness to palpation in the right upper quadrant and epigastrium, nondistended, positive bowel sounds. Extremities: No clubbing cyanosis or edema with positive pedal pulses.   Results  for orders placed during the hospital encounter of 07/06/11 (from the past 48 hour(s))  BASIC METABOLIC PANEL     Status: Abnormal   Collection Time   07/11/11  4:21 AM      Component Value Range Comment   Sodium 132 (*) 135 - 145 (mEq/L)    Potassium 3.3 (*) 3.5 - 5.1 (mEq/L)    Chloride 92 (*) 96 - 112 (mEq/L)    CO2 32  19 - 32 (mEq/L)    Glucose, Bld 86  70 - 99 (mg/dL)    BUN 10  6 - 23 (mg/dL)    Creatinine, Ser 1.61  0.50 - 1.10 (mg/dL)    Calcium 8.6  8.4 - 10.5 (mg/dL)    GFR calc non Af Amer >90  >90 (mL/min)    GFR calc Af Amer >90  >90 (mL/min)   CBC     Status: Normal   Collection Time   07/11/11  4:21 AM      Component Value Range Comment   WBC 5.5  4.0 - 10.5 (K/uL)    RBC 4.21  3.87 - 5.11 (MIL/uL)    Hemoglobin 13.6  12.0 - 15.0 (g/dL)    HCT 09.6  04.5 - 40.9 (%)    MCV 96.4  78.0 - 100.0 (fL)    MCH 32.3  26.0 - 34.0 (pg)    MCHC 33.5  30.0 - 36.0 (g/dL)    RDW 81.1  91.4  - 78.2 (%)    Platelets 293  150 - 400 (K/uL)   COMPREHENSIVE METABOLIC PANEL     Status: Abnormal   Collection Time   07/12/11  4:15 AM      Component Value Range Comment   Sodium 131 (*) 135 - 145 (mEq/L)    Potassium 3.7  3.5 - 5.1 (mEq/L)    Chloride 96  96 - 112 (mEq/L)    CO2 29  19 - 32 (mEq/L)    Glucose, Bld 89  70 - 99 (mg/dL)    BUN 8  6 - 23 (mg/dL)    Creatinine, Ser 9.56  0.50 - 1.10 (mg/dL)    Calcium 9.0  8.4 - 10.5 (mg/dL)    Total Protein 6.1  6.0 - 8.3 (g/dL)    Albumin 2.9 (*) 3.5 - 5.2 (g/dL)    AST 57 (*) 0 - 37 (U/L)    ALT 45 (*) 0 - 35 (U/L)    Alkaline Phosphatase 56  39 - 117 (U/L)    Total Bilirubin 0.3  0.3 - 1.2 (mg/dL)    GFR calc non Af Amer >90  >90 (mL/min)    GFR calc Af Amer >90  >90 (mL/min)   CBC     Status: Normal   Collection Time   07/12/11  4:15 AM      Component Value Range Comment   WBC 6.0  4.0 - 10.5 (K/uL)    RBC 4.35  3.87 - 5.11 (MIL/uL)    Hemoglobin 14.2  12.0 - 15.0 (g/dL)    HCT 21.3  08.6 - 57.8 (%)    MCV 95.2  78.0 - 100.0 (fL)    MCH 32.6  26.0 - 34.0 (pg)    MCHC 34.3  30.0 - 36.0 (g/dL)    RDW 46.9  62.9 - 52.8 (%)    Platelets 324  150 - 400 (K/uL)     Mr Abdomen W Wo Contrast  07/08/2011  *RADIOLOGY REPORT*  Clinical Data: Abdominal pain,  abnormal  CT, indeterminate liver lesion.  MRI ABDOMEN WITH AND WITHOUT CONTRAST  Technique:  Multiplanar multisequence MR imaging of the abdomen was performed both before and after administration of intravenous contrast.  Contrast: 13mL MULTIHANCE GADOBENATE DIMEGLUMINE 529 MG/ML IV SOLN  Comparison: CT abdomen 07/05/2008  Findings: Small bilateral pleural effusions.  No pericardial fluid.  No intrahepatic biliary ductal dilatation.  The gallbladder appears normal without evidence of gallbladder wall thickening.  The common bile duct appears normal.  Lesion of concern in the inferior right hepatic lobe demonstrates a central focus of high T2 signal (image 32, series 3).  This lesion  has intense early arterial enhancement in a roughly geographic pattern measuring approximately 4.1 x 2.3 cm (image 62, series 1103).  Lesion becomes isointense on the delayed series.  The second lesion in the more superior right hepatic lobe measures approximately 17 mm and  has early arterial enhancement (image 20, series 1103).   This lesion is not evident on the postcontrast series.  There are two additional small simple cysts within the right hepatic lobe (image 6, series 3 and image 24, series 3).  There is the pancreas, spleen, adrenal glands, and kidneys are normal.  The stomach and limited view of the small bowel and colon appear normal.  No abdominal lymphadenopathy.  IMPRESSION:  1. Two indeterminate enhancing lesions within the liver.  The larger lesion in the inferior right hepatic lobe may represent atypical focal nodular hyperplasia.  The lesion in the more superior right hepatic lobe likely represents a flash filling hemangioma or vascular phenomenon.  Due to the indeterminate characterization of the  larger lesion, recommend follow-up MRI in 3 to 6 months. Recommend the hepatocyte specific imaging MRI contrast agent (EOVIST) to increase specificity of characterization. 2.  The gallbladder  and biliary tree appear normal.  Original Report Authenticated By: Genevive Bi, M.D.   US Abdomen Complete  07/06/2011  *RADIOLOGY REPORT*  Clinical Data:  Right upper quadrant pain for 24 hours after a meal of salmon pattties.  COMPLETE ABDOMINAL ULTRASOUND  Comparison:  None.  Findings:  Gallbladder:  A few small polyps are present but there are no stones seen.  Gallbladder wall thickness is normal measuring 1.8 mm.  There is no pericholecystic fluid.  The patient has generalized mid to right-sided abdominal pain but evaluation for sonographic Murphy's sign was limited due to patient sedation.  Common bile duct:  2.8 mm, within normal limits.  Liver:  No focal lesion identified.  Within normal limits in  parenchymal echogenicity.  IVC:  Appears normal.  Pancreas:  No focal abnormality is seen, however the pancreatic duct is mildly enlarged measuring 3.4 mm.  Significance is uncertain; this could represent chronic pancreatitis or obstructing mass.  Spleen:  Within normal limits measuring 7.8 cm  Right Kidney:  Echogenic without masses or hydronephrosis measuring 12.0 cm  Left Kidney:  Echogenic without masses or hydronephrosis measuring 12.5 cm  Abdominal aorta:  Slightly enlarged with a maximal diameter of 2.5 cm.  IMPRESSION: No visible gallstones or pericholecystic fluid.  No biliary ductal dilatation, but pancreatic duct mildly enlarged. This could represent an obstructing lesion, or be a manifestation of chronic inflammation.  Echogenic kidneys without obstruction or mass.  Original Report Authenticated By: Elsie Stain, M.D.   Ct Abdomen Pelvis W Contrast  07/06/2011  *RADIOLOGY REPORT*  Clinical Data: Right upper quadrant abdominal pain, radiating to the back.  CT ABDOMEN AND PELVIS WITH CONTRAST  Technique:  Multidetector CT imaging of the  abdomen and pelvis was performed following the standard protocol during bolus administration of intravenous contrast.  Contrast: 80mL OMNIPAQUE IOHEXOL 300 MG/ML IV SOLN  Comparison: Abdominal ultrasound performed earlier today at 08:12 p.m.  Findings: Mild focal right basilar atelectasis is noted.  There is a nonspecific 1.4 cm focus of increased attenuation at the hepatic dome.  A more vague 4.5 cm focus of blush is seen at the inferior tip of the liver.  Though these may reflect an angioma, malignancy cannot be excluded.  A 1.1 cm cystic focus is noted within the right hepatic lobe.  The spleen is unremarkable in appearance.  The pancreas and adrenal glands are unremarkable.  The gallbladder is grossly unremarkable in appearance.  A tiny nonobstructing 2 mm stone is noted at the interpole region of the left kidney.  There is incomplete rotation of the right kidney.   A tiny 5 mm cyst is noted within the interpole region of the right kidney on delayed images.  The kidneys are otherwise unremarkable in appearance.  There is no evidence of hydronephrosis.  No obstructing ureteral stones are seen.  No perinephric stranding is appreciated.  No free fluid is identified.  The small bowel is unremarkable in appearance.  The stomach is filled with contrast and is within normal limits.  No acute vascular abnormalities are seen.  Diffuse calcification is noted along the abdominal aorta and its branches.  The cecum is flipped into the upper abdomen.  The distal ileum and appendix run alongside each other, extending into the lateral right mid abdomen.  There is no evidence for appendicitis.  Diffuse diverticulosis is noted along the descending and sigmoid colon; there is mild apparent mucosal thickening noted along the sigmoid colon, raising question for a mild infectious process versus mild chronic inflammation.  The colon is otherwise unremarkable in appearance.  The bladder is moderately distended and grossly unremarkable in appearance.  The patient is status post hysterectomy; no suspicious adnexal masses are seen.  No inguinal lymphadenopathy is seen.  No acute osseous abnormalities are identified.  Diffuse vacuum phenomenon and disc space narrowing are noted along the lumbar spine, with associated facet disease.  Endplate sclerotic changes are seen.  IMPRESSION:  1.  Mucosal thickening noted along the sigmoid colon, in a region of diffuse diverticulosis, raising question for a mild infectious process versus mild chronic inflammation.  Diverticulosis involves the descending and sigmoid colon, without evidence of frank diverticulitis. 2.  Vague 4.5 cm focus of blush at the inferior tip of the liver. Additional 1.4 cm focus of increased attenuation at the hepatic dome.  Though these could reflect hemangiomata, malignancy cannot be excluded.  Would correlate with LFTs and consider dynamic  liver protocol CT or MRI for further evaluation. 3.  Small hepatic and renal cysts seen. 4.  Tiny nonobstructing 2 mm stone at the interpole region of the left kidney. 5.  Diffuse calcification along the abdominal aorta and its branches. 6.  Mild focal right basilar atelectasis noted. 7.  Diffuse degenerative change along the lumbar spine.  Original Report Authenticated By: Tonia Ghent, M.D.   Mr [facility] Abdomen W Wo Contrast  07/12/2011  *RADIOLOGY REPORT*  Clinical Data:  Mesenteric ischemia  MRA ABDOMEN WITH AND WITHOUT CONTRAST  Technique:  Angiographic images of the abdomen were obtained using MRA technique with intravenous contrast.  Contrast:   12 ml Multihance IV  Comparison:  07/07/2011 and earlier studies  Findings: Abdominal aorta:  Moderate atheromatous irregularity without dissection, aneurysm, or  stenosis  Celiac axis:  Widely patent, with classic distal branching.  Superior mesenteric artery:  Widely patent, classic distal branching anatomy  Left renal artery:  There are three, the superior dominant.  No significant stenosis or other lesion.  Right renal artery:  Single, widely patent  Inferior mesenteric artery:  Diminutive, patent  Bifurcation:  No significant stenosis.  The visualized iliac arterial system is mildly tortuous without aneurysm or proximal stenosis.  Venous phase shows patency of the hepatic veins, portal vein, splenic vein, superior mesenteric vein, IVC, and renal veins. There is a retroaortic left renal vein, an anatomic variant.  Indeterminate small liver lesions as previously described. Unremarkable gallbladder, spleen, adrenal glands, pancreas.  No focal renal lesion.  No hydronephrosis.  No ascites.  IMPRESSION:  1.  Atheromatous abdominal aorta with no significant proximal visceral arterial occlusive disease to suggest an etiology of mesenteric ischemia.  Original Report Authenticated By: Osa Craver, M.D.   Nm Hepato W/eject Fract  07/08/2011  *RADIOLOGY REPORT*   Clinical Data:  Right upper quadrant pain.  NUCLEAR MEDICINE HEPATOBILIARY IMAGING WITH GALLBLADDER EF  Technique:  Sequential images of the abdomen were obtained out to 60 minutes following intravenous administration of radiopharmaceutical.  After slow intravenous infusion of 1.2 micrograms Cholecystokinin, gallbladder ejection fraction was determined.  Radiopharmaceutical:  5.4 mCi Tc-81m Choletec  Comparison:  MR abdomen 07/07/2011.  Findings: Homogeneous uptake by the liver.  Gallbladder is visualized by 12 minutes.  Small bowel not visualized in the first 60 minutes.  Small bowel visualized on initial imaging after injection of CCK.  Calculated gallbladder ejection fraction of 46.7% which is within the range normal limits (greater than 30%)  The patient did experience symptoms during CCK infusion.  IMPRESSION: Normal gallbladder ejection fraction.  The patient experienced pain with injection of CCK.  Original Report Authenticated By: Fuller Canada, M.D.     Disposition: Home  Diet: Low sodium heart healthy diet  Activity: Increase activity slowly   Follow-up Appts: Discharge Orders    Future Orders Please Complete By Expires   Diet - low sodium heart healthy      Increase activity slowly      Discharge instructions      Comments:   Follow up with Rogelia Boga, MD, in 1 week Follow up with Dr Madilyn Fireman in 2 weeks       TESTS THAT NEED FOLLOW-UP   Time spent on discharge, talking to the patient, and coordinating care: 60 mins.   SignedRamiro Harvest 07/12/2011, 4:47 PM

## 2011-08-01 ENCOUNTER — Other Ambulatory Visit: Payer: Self-pay

## 2011-08-01 MED ORDER — CLONAZEPAM 0.5 MG PO TABS: 0.5000 mg | ORAL_TABLET | Freq: Every day | ORAL | Status: DC

## 2011-08-03 ENCOUNTER — Telehealth: Payer: Self-pay | Admitting: Family Medicine

## 2011-08-03 NOTE — Telephone Encounter (Signed)
Pt was went to ER on 1 16/13 and was admitted. The ER doctor took pt off of Losartan-HCTZ. Pt was in hospital for 6 days and upon discharge she was instructed to start back on the above medication.  She wants to know if she should continue with medication or not? I will call pt and offer a office visit, she needs to schedule a post hospital visit. Pt will schedule the appointment ASAP.

## 2011-08-05 ENCOUNTER — Encounter: Payer: Self-pay | Admitting: Family Medicine

## 2011-08-05 ENCOUNTER — Ambulatory Visit (INDEPENDENT_AMBULATORY_CARE_PROVIDER_SITE_OTHER): Payer: Medicare Other | Admitting: Family Medicine

## 2011-08-05 VITALS — BP 134/80 | HR 93 | Temp 98.8°F | Wt 130.0 lb

## 2011-08-05 DIAGNOSIS — R1013 Epigastric pain: Secondary | ICD-10-CM

## 2011-08-05 DIAGNOSIS — E871 Hypo-osmolality and hyponatremia: Secondary | ICD-10-CM

## 2011-08-05 DIAGNOSIS — I10 Essential (primary) hypertension: Secondary | ICD-10-CM

## 2011-08-05 DIAGNOSIS — K259 Gastric ulcer, unspecified as acute or chronic, without hemorrhage or perforation: Secondary | ICD-10-CM

## 2011-08-05 LAB — BASIC METABOLIC PANEL
BUN: 13 mg/dL (ref 6–23)
Calcium: 9.2 mg/dL (ref 8.4–10.5)
GFR: 120.27 mL/min (ref >60.00)
Glucose, Bld: 98 mg/dL (ref 70–99)

## 2011-08-05 MED ORDER — LOSARTAN POTASSIUM 50 MG PO TABS: 50.0000 mg | ORAL_TABLET | Freq: Every day | ORAL | Status: DC

## 2011-08-05 NOTE — Progress Notes (Signed)
  Subjective:    Patient ID: Jill White, female    DOB: 08-20-1940, 71 y.o.   MRN: [id number]  HPI Here to follow up after a hospital stay from 07-06-11 to 07-12-11 for RUQ and epigastric pains. I felt it most likely that these were related to gall bladder dysfunction from her history and exam, but after she was admitted she had abdominal CT scans and an [place] which showed a normal looking GB with no stones. She did have a HIDA scan that was positive for recreating her pain and for delayed GB emptying after CCK stimulation. Dr. Daphine Deutscher was consulted for possible surgery, but he was reluctant to do a surgery at that point. She had upper endoscopy per Dr. Madilyn Fireman which showed a tiny gastric ulcer, so she was started on Protonix and Sucralfate for this. However Dr. Madilyn Fireman told her that he doubted if all her symptoms were coming from this tiny ulcer, and he thinks the problem is her GB also. She saw Dr. Madilyn Fireman yesterday, and he stopped the Sucralfate. She remains on Protonix. For awhile she was very careful with her diet, eating only bland foods and avoiding anything greasy or spicy. Now however she has returned to her regular diet, eating anything she wants, and drinking some wine occasionally. So far she has done well with no nausea or pain.    Review of Systems  Constitutional: Negative.   Respiratory: Negative.   Cardiovascular: Negative.   Gastrointestinal: Negative.        Objective:   Physical Exam  Constitutional: She appears well-developed and well-nourished.  Cardiovascular: Normal rate, regular rhythm, normal heart sounds and intact distal pulses.   Pulmonary/Chest: Effort normal and breath sounds normal.  Abdominal: Soft. Bowel sounds are normal. She exhibits no distension and no mass. There is no tenderness. There is no rebound and no guarding.          Assessment & Plan:  Epigastric pain which is probably related to gall bladder dysfunction. At this point I agreed with her plan to eat  her normal diet, and if she gets the pains again then she would probably proceed with GB surgery. She had some hyponatremia in the hospital, so we will change her Losartan HCT to Losartan and we will avoid thiazide diuretics. Check a BMET today

## 2011-08-09 NOTE — Progress Notes (Signed)
Quick Note:  Spoke with pt ______ 

## 2011-08-19 ENCOUNTER — Telehealth: Payer: Self-pay | Admitting: Family Medicine

## 2011-08-19 MED ORDER — FUROSEMIDE 20 MG PO TABS: 20.0000 mg | ORAL_TABLET | Freq: Every day | ORAL | Status: DC | PRN

## 2011-08-19 NOTE — Telephone Encounter (Signed)
Script called in and spoke with pt. 

## 2011-08-19 NOTE — Telephone Encounter (Signed)
Pt called to check on status of previous call re: leg edema. Pt is req that nurse call back asap. Pt says that if a med is called in, to pls call in to [facility] asap today, so that pt can pick up med Saturday before 2pm. Pls call in, do not do escript.

## 2011-08-19 NOTE — Telephone Encounter (Signed)
Try Lasix 20 mg a day prn edema, call in #[phone] with 5 rf

## 2011-08-19 NOTE — Telephone Encounter (Signed)
Pt called re: change of bp med due to sodium lvl. Pt was taken off of the one containing hctz, but now pt is retaining fluid. Pt wants to know if she needs to go back on hctz or does Dr Clent Ridges want to prescribe a diff diurectic on the side?

## 2011-09-05 ENCOUNTER — Telehealth: Payer: Self-pay | Admitting: Family Medicine

## 2011-09-05 MED ORDER — ONDANSETRON HCL 4 MG PO TABS: 4.0000 mg | ORAL_TABLET | Freq: Four times a day (QID) | ORAL | Status: DC | PRN

## 2011-09-05 NOTE — Telephone Encounter (Signed)
Dr. Clent Ridges did approve the Zofran and I called in to [facility].

## 2011-12-08 ENCOUNTER — Other Ambulatory Visit: Payer: Self-pay | Admitting: Internal Medicine

## 2011-12-08 NOTE — Telephone Encounter (Signed)
Dr Fry pt 

## 2012-01-06 ENCOUNTER — Telehealth: Payer: Self-pay | Admitting: Family Medicine

## 2012-01-06 NOTE — Telephone Encounter (Signed)
Call in #[phone] with one rf 

## 2012-01-06 NOTE — Telephone Encounter (Signed)
Refill request for Clonazepam 0.5 mg take 1 po qhs and last here on 08/05/11.

## 2012-01-08 ENCOUNTER — Other Ambulatory Visit: Payer: Self-pay | Admitting: Internal Medicine

## 2012-01-09 MED ORDER — CLONAZEPAM 0.5 MG PO TABS: 0.5000 mg | ORAL_TABLET | Freq: Every day | ORAL | Status: DC

## 2012-01-09 NOTE — Telephone Encounter (Signed)
Dr Fry pt 

## 2012-01-09 NOTE — Telephone Encounter (Signed)
I called in script 

## 2012-01-30 ENCOUNTER — Other Ambulatory Visit: Payer: Self-pay | Admitting: Family Medicine

## 2012-02-15 ENCOUNTER — Other Ambulatory Visit: Payer: Self-pay | Admitting: Family Medicine

## 2012-03-21 ENCOUNTER — Telehealth: Payer: Self-pay | Admitting: Family Medicine

## 2012-03-21 NOTE — Telephone Encounter (Signed)
Refill request for Zofran 4 mg and send to [facility], pt was last here on 08/05/11.

## 2012-03-22 MED ORDER — ONDANSETRON HCL 4 MG PO TABS: 4.0000 mg | ORAL_TABLET | ORAL | Status: DC | PRN

## 2012-03-22 NOTE — Telephone Encounter (Signed)
To use q 4 hours prn nausea, call in #[phone] with 5 rf

## 2012-03-22 NOTE — Telephone Encounter (Signed)
rx sent to pharmacy

## 2012-06-15 ENCOUNTER — Other Ambulatory Visit: Payer: Self-pay

## 2012-06-15 ENCOUNTER — Inpatient Hospital Stay (HOSPITAL_COMMUNITY)
Admission: AD | Admit: 2012-06-15 | Discharge: 2012-06-19 | DRG: 189 | Disposition: A | Discharge status: Home / Self Care | Payer: Medicare Other | Source: Ambulatory Visit | Attending: Internal Medicine | Admitting: Internal Medicine

## 2012-06-15 ENCOUNTER — Ambulatory Visit (INDEPENDENT_AMBULATORY_CARE_PROVIDER_SITE_OTHER): Payer: Medicare Other | Admitting: Family

## 2012-06-15 ENCOUNTER — Inpatient Hospital Stay (HOSPITAL_COMMUNITY): Payer: Medicare Other

## 2012-06-15 ENCOUNTER — Encounter (HOSPITAL_COMMUNITY): Payer: Self-pay | Admitting: Internal Medicine

## 2012-06-15 ENCOUNTER — Encounter: Payer: Self-pay | Admitting: Family

## 2012-06-15 VITALS — BP 138/70 | HR 110 | Temp 98.2°F | Resp 20 | Wt 135.0 lb

## 2012-06-15 DIAGNOSIS — F17200 Nicotine dependence, unspecified, uncomplicated: Secondary | ICD-10-CM

## 2012-06-15 DIAGNOSIS — Z720 Tobacco use: Secondary | ICD-10-CM

## 2012-06-15 DIAGNOSIS — F17210 Nicotine dependence, cigarettes, uncomplicated: Secondary | ICD-10-CM | POA: Diagnosis present

## 2012-06-15 DIAGNOSIS — R0602 Shortness of breath: Secondary | ICD-10-CM

## 2012-06-15 DIAGNOSIS — M25559 Pain in unspecified hip: Secondary | ICD-10-CM

## 2012-06-15 DIAGNOSIS — Z79899 Other long term (current) drug therapy: Secondary | ICD-10-CM

## 2012-06-15 DIAGNOSIS — R000 Tachycardia, unspecified: Secondary | ICD-10-CM

## 2012-06-15 DIAGNOSIS — J209 Acute bronchitis, unspecified: Secondary | ICD-10-CM | POA: Diagnosis present

## 2012-06-15 DIAGNOSIS — J441 Chronic obstructive pulmonary disease with (acute) exacerbation: Secondary | ICD-10-CM | POA: Diagnosis present

## 2012-06-15 DIAGNOSIS — J449 Chronic obstructive pulmonary disease, unspecified: Secondary | ICD-10-CM

## 2012-06-15 DIAGNOSIS — I10 Essential (primary) hypertension: Secondary | ICD-10-CM

## 2012-06-15 DIAGNOSIS — J9600 Acute respiratory failure, unspecified whether with hypoxia or hypercapnia: Principal | ICD-10-CM | POA: Diagnosis present

## 2012-06-15 DIAGNOSIS — R112 Nausea with vomiting, unspecified: Secondary | ICD-10-CM

## 2012-06-15 DIAGNOSIS — G2581 Restless legs syndrome: Secondary | ICD-10-CM | POA: Diagnosis present

## 2012-06-15 DIAGNOSIS — R197 Diarrhea, unspecified: Secondary | ICD-10-CM | POA: Diagnosis present

## 2012-06-15 DIAGNOSIS — K5289 Other specified noninfective gastroenteritis and colitis: Secondary | ICD-10-CM | POA: Diagnosis present

## 2012-06-15 DIAGNOSIS — R0902 Hypoxemia: Secondary | ICD-10-CM

## 2012-06-15 DIAGNOSIS — E871 Hypo-osmolality and hyponatremia: Secondary | ICD-10-CM | POA: Diagnosis present

## 2012-06-15 DIAGNOSIS — J440 Chronic obstructive pulmonary disease with acute lower respiratory infection: Secondary | ICD-10-CM | POA: Diagnosis present

## 2012-06-15 DIAGNOSIS — E860 Dehydration: Secondary | ICD-10-CM | POA: Diagnosis present

## 2012-06-15 DIAGNOSIS — R062 Wheezing: Secondary | ICD-10-CM

## 2012-06-15 DIAGNOSIS — R05 Cough: Secondary | ICD-10-CM

## 2012-06-15 LAB — COMPREHENSIVE METABOLIC PANEL
ALT: 35 U/L (ref 0–35)
AST: 29 U/L (ref 0–37)
CO2: 31 mEq/L (ref 19–32)
Calcium: 8.9 mg/dL (ref 8.4–10.5)
Chloride: 91 mEq/L — ABNORMAL LOW (ref 96–112)
GFR calc Af Amer: >90 mL/min (ref >90)
GFR calc non Af Amer: >90 mL/min (ref >90)
Glucose, Bld: 93 mg/dL (ref 70–99)
Sodium: 130 mEq/L — ABNORMAL LOW (ref 135–145)
Total Bilirubin: 0.4 mg/dL (ref 0.3–1.2)

## 2012-06-15 LAB — PROTIME-INR
INR: 0.98 (ref 0.00–1.49)
Prothrombin Time: 12.9 seconds (ref 11.6–15.2)

## 2012-06-15 LAB — CBC
MCH: 33.5 pg (ref 26.0–34.0)
MCHC: 34.6 g/dL (ref 30.0–36.0)
Platelets: 244 10*3/uL (ref 150–400)
RDW: 13.2 % (ref 11.5–15.5)

## 2012-06-15 MED ORDER — VANCOMYCIN HCL IN DEXTROSE 1-5 GM/200ML-% IV SOLN
1000.0000 mg | INTRAVENOUS | Status: DC
  Administered 2012-06-16: 1000 mg via INTRAVENOUS

## 2012-06-15 MED ORDER — GUAIFENESIN ER 600 MG PO TB12
600.0000 mg | ORAL_TABLET | Freq: Two times a day (BID) | ORAL | Status: DC
  Administered 2012-06-15 – 2012-06-19 (×8): 600 mg via ORAL
  Filled 2012-06-15 (×10): qty 1

## 2012-06-15 MED ORDER — ASPIRIN 81 MG PO TABS: 81.0000 mg | ORAL_TABLET | Freq: Every day | ORAL | Status: DC

## 2012-06-15 MED ORDER — DEXTROSE 5 % IV SOLN
1.0000 g | INTRAVENOUS | Status: DC
  Administered 2012-06-15: 1 g via INTRAVENOUS
  Filled 2012-06-15 (×2): qty 10

## 2012-06-15 MED ORDER — GUAIFENESIN-DM 100-10 MG/5ML PO SYRP: 5.0000 mL | ORAL_SOLUTION | ORAL | Status: DC | PRN

## 2012-06-15 MED ORDER — NICOTINE 14 MG/24HR TD PT24
14.0000 mg | MEDICATED_PATCH | Freq: Every day | TRANSDERMAL | Status: DC
  Administered 2012-06-15 – 2012-06-17 (×3): 14 mg via TRANSDERMAL
  Filled 2012-06-15 (×3): qty 1

## 2012-06-15 MED ORDER — ONDANSETRON HCL 4 MG/2ML IJ SOLN
4.0000 mg | Freq: Four times a day (QID) | INTRAMUSCULAR | Status: DC | PRN
  Administered 2012-06-15 – 2012-06-16 (×3): 4 mg via INTRAVENOUS
  Filled 2012-06-15 (×3): qty 2

## 2012-06-15 MED ORDER — ASPIRIN 81 MG PO CHEW
81.0000 mg | CHEWABLE_TABLET | Freq: Every day | ORAL | Status: DC
  Administered 2012-06-16 – 2012-06-19 (×4): 81 mg via ORAL
  Filled 2012-06-15 (×4): qty 1

## 2012-06-15 MED ORDER — IPRATROPIUM BROMIDE 0.02 % IN SOLN
0.5000 mg | Freq: Four times a day (QID) | RESPIRATORY_TRACT | Status: DC
  Administered 2012-06-15 – 2012-06-19 (×13): 0.5 mg via RESPIRATORY_TRACT
  Filled 2012-06-15 (×15): qty 2.5

## 2012-06-15 MED ORDER — CALCIUM CARBONATE-VITAMIN D 500-200 MG-UNIT PO TABS
1.0000 | ORAL_TABLET | Freq: Every day | ORAL | Status: DC
  Administered 2012-06-16 – 2012-06-19 (×4): 1 via ORAL
  Filled 2012-06-15 (×4): qty 1

## 2012-06-15 MED ORDER — PANTOPRAZOLE SODIUM 40 MG PO TBEC
40.0000 mg | DELAYED_RELEASE_TABLET | Freq: Two times a day (BID) | ORAL | Status: DC
  Administered 2012-06-15 – 2012-06-19 (×8): 40 mg via ORAL
  Filled 2012-06-15 (×10): qty 1

## 2012-06-15 MED ORDER — ACETAMINOPHEN 325 MG PO TABS: 650.0000 mg | ORAL_TABLET | Freq: Four times a day (QID) | ORAL | Status: DC | PRN

## 2012-06-15 MED ORDER — VANCOMYCIN HCL IN DEXTROSE 1-5 GM/200ML-% IV SOLN
1000.0000 mg | Freq: Once | INTRAVENOUS | Status: DC
  Filled 2012-06-15: qty 200

## 2012-06-15 MED ORDER — AMLODIPINE BESYLATE 5 MG PO TABS
5.0000 mg | ORAL_TABLET | Freq: Every day | ORAL | Status: DC
Start: 2012-06-15 — End: 2012-06-16
  Administered 2012-06-15 – 2012-06-16 (×2): 5 mg via ORAL
  Filled 2012-06-15 (×2): qty 1

## 2012-06-15 MED ORDER — AMLODIPINE BESYLATE 5 MG PO TABS: 5.0000 mg | ORAL_TABLET | Freq: Every day | ORAL | Status: DC

## 2012-06-15 MED ORDER — LEVOFLOXACIN IN D5W 750 MG/150ML IV SOLN
750.0000 mg | INTRAVENOUS | Status: AC
Start: 2012-06-15 — End: 2012-06-18
  Administered 2012-06-15 – 2012-06-18 (×4): 750 mg via INTRAVENOUS
  Filled 2012-06-15 (×5): qty 150

## 2012-06-15 MED ORDER — ENOXAPARIN SODIUM 40 MG/0.4ML ~~LOC~~ SOLN
40.0000 mg | SUBCUTANEOUS | Status: DC
  Administered 2012-06-15 – 2012-06-18 (×4): 40 mg via SUBCUTANEOUS
  Filled 2012-06-15 (×6): qty 0.4

## 2012-06-15 MED ORDER — CLONAZEPAM 0.5 MG PO TABS
0.5000 mg | ORAL_TABLET | Freq: Every day | ORAL | Status: DC
  Administered 2012-06-15 – 2012-06-18 (×4): 0.5 mg via ORAL
  Filled 2012-06-15 (×4): qty 1

## 2012-06-15 MED ORDER — ALBUTEROL SULFATE (5 MG/ML) 0.5% IN NEBU
2.5000 mg | INHALATION_SOLUTION | Freq: Four times a day (QID) | RESPIRATORY_TRACT | Status: DC
  Administered 2012-06-15 – 2012-06-19 (×13): 2.5 mg via RESPIRATORY_TRACT
  Filled 2012-06-15 (×14): qty 0.5

## 2012-06-15 MED ORDER — ACETAMINOPHEN 650 MG RE SUPP: 650.0000 mg | Freq: Four times a day (QID) | RECTAL | Status: DC | PRN

## 2012-06-15 MED ORDER — ALBUTEROL SULFATE (5 MG/ML) 0.5% IN NEBU: 2.5000 mg | INHALATION_SOLUTION | RESPIRATORY_TRACT | Status: DC | PRN

## 2012-06-15 MED ORDER — SODIUM CHLORIDE 0.9 % IV SOLN
INTRAVENOUS | Status: DC
  Administered 2012-06-15: 19:00:00 via INTRAVENOUS

## 2012-06-15 MED ORDER — LOSARTAN POTASSIUM 50 MG PO TABS
50.0000 mg | ORAL_TABLET | Freq: Every day | ORAL | Status: DC
  Administered 2012-06-15 – 2012-06-16 (×2): 50 mg via ORAL
  Filled 2012-06-15 (×2): qty 1

## 2012-06-15 MED ORDER — METHYLPREDNISOLONE SODIUM SUCC 40 MG IJ SOLR
40.0000 mg | Freq: Two times a day (BID) | INTRAMUSCULAR | Status: DC
  Administered 2012-06-15 – 2012-06-16 (×3): 40 mg via INTRAVENOUS
  Filled 2012-06-15 (×7): qty 1

## 2012-06-15 MED ORDER — SERTRALINE HCL 50 MG PO TABS
50.0000 mg | ORAL_TABLET | Freq: Every day | ORAL | Status: DC
  Administered 2012-06-16: 50 mg via ORAL
  Filled 2012-06-15: qty 1

## 2012-06-15 MED ORDER — ONDANSETRON HCL 4 MG PO TABS: 4.0000 mg | ORAL_TABLET | Freq: Four times a day (QID) | ORAL | Status: DC | PRN

## 2012-06-15 MED ORDER — VITAMIN E 45 MG (100 UNIT) PO CAPS
100.0000 [IU] | ORAL_CAPSULE | Freq: Every day | ORAL | Status: DC
  Administered 2012-06-16 – 2012-06-19 (×4): 100 [IU] via ORAL
  Filled 2012-06-15 (×4): qty 1

## 2012-06-15 NOTE — Progress Notes (Signed)
Subjective:    Patient ID: Jill White, female    DOB: [birth year], 70 y.o.   MRN: [id number]  HPI 71 year old white female, smoker, patient of Dr. Clent Ridges is in today with 5 days of nausea, vomiting, diarrhea, shortness of breath and fever. She has had decreased appetite and has been unable to consume any foods and dairy little liquids. Patient reports similar symptoms when she had pneumonia about a year ago. Continues to smoke one pack per day. Reports wheezing in her lungs. Has not currently on any maintenance or rescue inhalers. Denies any blood in her stools, dark black stools, abdominal pain.   Review of Systems  Constitutional: Positive for fever, chills, activity change, appetite change and fatigue.  HENT: Negative.   Eyes: Negative.   Respiratory: Positive for cough, shortness of breath and wheezing.   Cardiovascular: Negative.  Negative for chest pain, palpitations and leg swelling.  Gastrointestinal: Positive for nausea, vomiting and diarrhea. Negative for abdominal pain, abdominal distention and rectal pain.  Genitourinary: Negative.   Musculoskeletal: Negative.   Skin: Negative.   Neurological: Negative.   Hematological: Negative.   Psychiatric/Behavioral: Negative.    Past Medical History  Diagnosis Date  . HIP PAIN 03/06/2007  . HYPERTENSION 02/09/2007  . Restless leg syndrome   . COPD (chronic obstructive pulmonary disease)   . Complication of anesthesia   . Ulcer     gastric antrum     History   Social History  . Marital Status: Widowed    Spouse Name: N/A    Number of Children: N/A  . Years of Education: N/A   Occupational History  . Not on file.   Social History Main Topics  . Smoking status: Current Every Day Smoker -- 0.5 packs/day    Types: Cigarettes  . Smokeless tobacco: Never Used  . Alcohol Use: 0.5 oz/week    1 drink(s) per week  . Drug Use: No  . Sexually Active: No   Other Topics Concern  . Not on file   Social History Narrative  . No  narrative on file    Past Surgical History  Procedure Date  . Abdominal hysterectomy   . Dilation and curettage of uterus   . Tonsillectomy   . Esophagogastroduodenoscopy 07/09/2011    Procedure: ESOPHAGOGASTRODUODENOSCOPY (EGD);  Surgeon: Barrie Folk, MD;  Location: [facility];  Service: Endoscopy;  Laterality: N/A;  patient in room 1532    Family History  Problem Relation Age of Onset  . Heart disease Sister   . Heart disease Brother     Allergies  Allergen Reactions  . Tetanus Toxoid Anaphylaxis  . Codeine Nausea And Vomiting and Other (See Comments)    Itching and faint   . Erythromycin Nausea And Vomiting  . Meloxicam Itching  . Penicillins Hives  . Prednisone Other (See Comments)    GI bleed    Current Outpatient Prescriptions on File Prior to Visit  Medication Sig Dispense Refill  . amLODipine (NORVASC) 5 MG tablet TAKE ONE TABLET BY MOUTH ONE TIME DAILY  90 tablet  1  . aspirin 81 MG tablet Take 81 mg by mouth daily.        . calcium-vitamin D (OSCAL WITH D) 500-200 MG-UNIT per tablet Take 1 tablet by mouth daily.      . clonazePAM (KLONOPIN) 0.5 MG tablet Take 1 tablet (0.5 mg total) by mouth at bedtime.  90 tablet  1  . furosemide (LASIX) 20 MG tablet TAKE ONE TABLET BY MOUTH  DAILY AS NEEDED  30 tablet  6  . losartan (COZAAR) 50 MG tablet Take 1 tablet (50 mg total) by mouth daily.  90 tablet  3  . ondansetron (ZOFRAN) 4 MG tablet Take 1 tablet (4 mg total) by mouth every 6 (six) hours as needed.  30 tablet  5  . ondansetron (ZOFRAN) 4 MG tablet Take 1 tablet (4 mg total) by mouth every 4 (four) hours as needed for nausea.  60 tablet  5  . pantoprazole (PROTONIX) 40 MG tablet Take 1 tablet (40 mg total) by mouth 2 (two) times daily.  60 tablet  0  . sertraline (ZOLOFT) 50 MG tablet TAKE ONE TABLET BY MOUTH ONE TIME DAILY  30 tablet  2  . vitamin E 100 UNIT capsule Take 100 Units by mouth daily.        BP 138/70  Pulse 110  Temp 98.2 F (36.8 C) (Oral)   Resp 20  Wt 135 lb (61.236 kg)  SpO2 82%chart    Objective:   Physical Exam  Constitutional: She is oriented to person, place, and time.       Appears ill, in mild respiratory distress  HENT:  Right Ear: External ear normal.  Left Ear: External ear normal.  Nose: Nose normal.  Mouth/Throat: Oropharynx is clear and moist.  Eyes: Conjunctivae normal are normal. Pupils are equal, round, and reactive to light.  Neck: Normal range of motion. Neck supple. No thyromegaly present.  Cardiovascular: Normal rate, regular rhythm and normal heart sounds.        Heart rate: 110-132  Pulmonary/Chest: She has wheezes. She has rales.       Sats 82% on room air after nebulizer treatment  Abdominal: Soft. Bowel sounds are normal. She exhibits no distension. There is no tenderness. There is no rebound.  Musculoskeletal: Normal range of motion.  Neurological: She is alert and oriented to person, place, and time.  Skin: Skin is warm.  Psychiatric: She has a normal mood and affect.          Assessment & Plan:  Assessment: COPD, Wheezing, Fatigue, Dehydration, Anorexia, Tachycardia, Hypoxemia  Plan: After speaking with the hospitalist, patient will be admitted to the step down unit at [facility]. Family advised and will take her directly to [facility] today.

## 2012-06-15 NOTE — H&P (Signed)
Patient's PCP: Nelwyn Salisbury, MD  Chief Complaint: Shortness of breath  History of Present Illness: Jill White is a 71 y.o. Caucasian female with history of hypertension, COPD, and tobacco use who presents with the above complaints.  Patient reports that few weeks ago there was a "stomach flu" going around her family members.  On 06/10/2012 she had a fever of 101.8-102 with some sinus congestion and diarrhea.  Initially patient thought that she may have flulike symptoms.  [place] however her shortness of breath and flulike symptoms continue to persist as a result she presented to her primary care physician's office and saw Ms. Orvan Falconer, NP.  She was found to be hypoxic with saturation of 82, with some wheezing, as a result the hospitalist service was contacted to admit the patient from her primary care physician's office.  She notes having persistent runny nose and cough productive for clear sputum.  Has been feeling nauseated but has not vomited.  Does complain of chest pain which is associated with her cough.  Denies any abdominal pain.  Denies any headaches or vision changes.  Past Medical History  Diagnosis Date  . HIP PAIN 03/06/2007  . HYPERTENSION 02/09/2007  . Restless leg syndrome   . COPD (chronic obstructive pulmonary disease)   . Complication of anesthesia   . Ulcer     gastric antrum    Past Surgical History  Procedure Date  . Abdominal hysterectomy   . Dilation and curettage of uterus   . Tonsillectomy   . Esophagogastroduodenoscopy 07/09/2011    Procedure: ESOPHAGOGASTRODUODENOSCOPY (EGD);  Surgeon: Barrie Folk, MD;  Location: [facility];  Service: Endoscopy;  Laterality: N/A;  patient in room 1532   Family History  Problem Relation Age of Onset  . Heart disease Sister   . Heart disease Brother   . Dementia Mother   . Anemia Father    History   Social History  . Marital Status: Widowed    Spouse Name: N/A    Number of  Children: N/A  . Years of Education: N/A   Occupational History  . Not on file.   Social History Main Topics  . Smoking status: Current Every Day Smoker -- 0.5 packs/day    Types: Cigarettes  . Smokeless tobacco: Never Used  . Alcohol Use: 0.5 oz/week    1 drink(s) per week  . Drug Use: No  . Sexually Active: No   Other Topics Concern  . Not on file   Social History Narrative  . No narrative on file   Allergies: Tetanus toxoid; Codeine; Erythromycin; Meloxicam; Penicillins; and Prednisone  Home Meds: Prior to Admission medications   Medication Sig Start Date End Date Taking? Authorizing Provider  amLODipine (NORVASC) 5 MG tablet TAKE ONE TABLET BY MOUTH ONE TIME DAILY 01/08/12   Nelwyn Salisbury, MD  aspirin 81 MG tablet Take 81 mg by mouth daily.      Historical Provider, MD  calcium-vitamin D (OSCAL WITH D) 500-200 MG-UNIT per tablet Take 1 tablet by mouth daily.    Historical Provider, MD  clonazePAM (KLONOPIN) 0.5 MG tablet Take 1 tablet (0.5 mg total) by mouth at bedtime. 01/09/12   Nelwyn Salisbury, MD  furosemide (LASIX) 20 MG tablet TAKE ONE TABLET BY MOUTH DAILY AS NEEDED 02/15/12   Nelwyn Salisbury, MD  losartan (COZAAR) 50 MG tablet Take 1 tablet (50 mg total) by mouth daily. 08/05/11 08/04/12  Nelwyn Salisbury, MD  ondansetron (  ZOFRAN) 4 MG tablet Take 1 tablet (4 mg total) by mouth every 6 (six) hours as needed. 09/05/11   Nelwyn Salisbury, MD  ondansetron (ZOFRAN) 4 MG tablet Take 1 tablet (4 mg total) by mouth every 4 (four) hours as needed for nausea. 03/22/12   Nelwyn Salisbury, MD  pantoprazole (PROTONIX) 40 MG tablet Take 1 tablet (40 mg total) by mouth 2 (two) times daily. 07/12/11 07/11/12  Rodolph Bong, MD  sertraline (ZOLOFT) 50 MG tablet TAKE ONE TABLET BY MOUTH ONE TIME DAILY 01/30/12   Nelwyn Salisbury, MD  vitamin E 100 UNIT capsule Take 100 Units by mouth daily.    Historical Provider, MD    Review of Systems: All systems reviewed with the patient and positive as per  history of present illness, otherwise all other systems are negative.  Physical Exam: Blood pressure 152/84, pulse 83, temperature 98.4 F (36.9 C), temperature source Oral, resp. rate 21, height 5' 2.99" (1.6 m), weight 61.236 kg (135 lb), SpO2 90.00%. General: Awake, Oriented x3, in some distress from shortness of breath. HEENT: EOMI, dry mucous membranes Neck: Supple CV: S1 and S2 Lungs: Mild expiratory wheezing, good air movement bilaterally. Abdomen: Soft, Nontender, Nondistended, +bowel sounds. Ext: Good pulses. Trace edema. No clubbing or cyanosis noted. Neuro: Cranial Nerves II-XII grossly intact. Has 5/5 motor strength in upper and lower extremities.  Lab results:  [facility] 06/15/12 1805  NA 130*  K 4.1  CL 91*  CO2 31  GLUCOSE 93  BUN 6  CREATININE 0.47*  CALCIUM 8.9  MG --  PHOS --    Basename 06/15/12 1805  AST 29  ALT 35  ALKPHOS 95  BILITOT 0.4  PROT 6.8  ALBUMIN 3.2*   No results found for this basename: LIPASE:2,AMYLASE:2 in the last 72 hours  Basename 06/15/12 1805  WBC 5.7  NEUTROABS --  HGB 16.8*  HCT 48.6*  MCV 96.8  PLT 244   No results found for this basename: CKTOTAL:3,CKMB:3,CKMBINDEX:3,TROPONINI:3 in the last 72 hours No components found with this basename: POCBNP:3 No results found for this basename: DDIMER in the last 72 hours No results found for this basename: HGBA1C:2 in the last 72 hours No results found for this basename: CHOL:2,HDL:2,LDLCALC:2,TRIG:2,CHOLHDL:2,LDLDIRECT:2 in the last 72 hours No results found for this basename: TSH,T4TOTAL,FREET3,T3FREE,THYROIDAB in the last 72 hours No results found for this basename: VITAMINB12:2,FOLATE:2,FERRITIN:2,TIBC:2,IRON:2,RETICCTPCT:2 in the last 72 hours Imaging results:  No results found. Other results: EKG: Sinus rhythm with HR 80s, no ST changes.  Assessment & Plan by Problem: Acute hypoxic respiratory failure Etiology unclear.  Suspect upper respiratory viral +/- pneumonia.   Given patient has expiratory wheezing also suspect patient has COPD exacerbation.  Start patient on Solu-Medrol, neb treatments, and oxygen.  Will get her chest x-ray for better evaluation.  Send for blood cultures x2, influenza PCR, urine strep pneumo, urine Legionella and HIV.  Start patient on vancomycin, levofloxacin, and ceftriaxone.  COPD exacerbation Management as indicated above.  Dehydration Likely due to poor by mouth intake and persistent nausea.  IV hydration as indicated above.  Diarrhea Likely due to viral gastroenteritis.  Sent for stool culture and C. difficile PCR.  IV hydration as indicated above.  Tobacco use Counseled the patient on cessation.  Prescribe nicotine patch.  Hypertension Not well controlled.  Patient did not take her home antihypertensive medications.  Resume home antihypertensive medications.  Continue to monitor.  Restless leg syndrome Continue to monitor.  Hyponatremia Due to nausea and  dehydration. Continue to monitor.  Prophylaxis Lovenox.  CODE STATUS Full code.  Discussed with the patient at the time of admission.  Disposition Admit the patient to step down for closer monitoring.  Communication Granddaughter updated at bedside.  Time spent on admission, talking to the patient, and coordinating care was: 60 mins.  Beyonca Wisz A, MD 06/15/2012, 7:26 PM

## 2012-06-15 NOTE — Progress Notes (Addendum)
ANTIBIOTIC CONSULT NOTE - INITIAL  Pharmacy Consult for vancomycin Indication: pneumonia  Allergies  Allergen Reactions  . Tetanus Toxoid Anaphylaxis  . Codeine Nausea And Vomiting and Other (See Comments)    Itching and faint   . Erythromycin Nausea And Vomiting  . Meloxicam Itching  . Penicillins Hives  . Prednisone Other (See Comments)    GI bleed    Patient Measurements:   Weight: 61.2 kg as of today  Vital Signs: Temp: 98.4 F (36.9 C) (12/27 1745) Temp src: Oral (12/27 1745) BP: 138/70 mmHg (12/27 1544) Pulse Rate: 110  (12/27 1544) Intake/Output from previous day:   Intake/Output from this shift:    Labs: No results found for this basename: WBC:3,HGB:3,PLT:3,LABCREA:3,CREATININE:3 in the last 72 hours The CrCl is unknown because both a height and weight (above a minimum accepted value) are required for this calculation. No results found for this basename: VANCOTROUGH:2,VANCOPEAK:2,VANCORANDOM:2,GENTTROUGH:2,GENTPEAK:2,GENTRANDOM:2,TOBRATROUGH:2,TOBRAPEAK:2,TOBRARND:2,AMIKACINPEAK:2,AMIKACINTROU:2,AMIKACIN:2, in the last 72 hours   Microbiology: No results found for this or any previous visit (from the past 720 hour(s)).  Medical History: Past Medical History  Diagnosis Date  . HIP PAIN 03/06/2007  . HYPERTENSION 02/09/2007  . Restless leg syndrome   . COPD (chronic obstructive pulmonary disease)   . Complication of anesthesia   . Ulcer     gastric antrum     Medications:  Scheduled:    . albuterol  2.5 mg Nebulization Q6H  . amLODipine  5 mg Oral Daily  . aspirin  81 mg Oral Daily  . calcium-vitamin D  1 tablet Oral Daily  . cefTRIAXone (ROCEPHIN)  IV  1 g Intravenous Q24H  . clonazePAM  0.5 mg Oral QHS  . enoxaparin (LOVENOX) injection  40 mg Subcutaneous Q24H  . guaiFENesin  600 mg Oral BID  . ipratropium  0.5 mg Nebulization Q6H  . levofloxacin (LEVAQUIN) IV  750 mg Intravenous Q24H  . losartan  50 mg Oral Daily  . methylPREDNISolone  (SOLU-MEDROL) injection  40 mg Intravenous Q12H  . pantoprazole  40 mg Oral BID  . sertraline  50 mg Oral Daily  . vitamin E  100 Units Oral Daily   Infusions:    . sodium chloride     Assessment:  71 yo female with hx COPD to be admitted to ICU for suspected PNA to start vanc/Levaquin/Rocephin  Goal of Therapy:  Vancomycin trough level 15-20 mcg/ml  Plan:  1) Vancomycin 1g IV x1  2) Will await lab result of SCr before calculating further vanc dosing   Hessie Knows, PharmD, BCPS Pager [phone] 06/15/2012 6:16 PM   Addendum:  Labs: Scr 0.47, CrCl 52 CG (N 71)   A/P: Vanc 1g x 1 ordered. With renal function now estimated, will start vancomycin 1g IV q24 (goal trough 15-20) (dosed on conservative side)   Hessie Knows, PharmD, BCPS Pager [phone] 06/15/2012 6:58 PM

## 2012-06-16 LAB — OSMOLALITY: Osmolality: 276 mOsm/kg (ref 275–300)

## 2012-06-16 LAB — CBC
HCT: 46.3 % — ABNORMAL HIGH (ref 36.0–46.0)
MCH: 33.2 pg (ref 26.0–34.0)
MCV: 96.7 fL (ref 78.0–100.0)
RBC: 4.79 MIL/uL (ref 3.87–5.11)
RDW: 13 % (ref 11.5–15.5)
WBC: 3.9 10*3/uL — ABNORMAL LOW (ref 4.0–10.5)

## 2012-06-16 LAB — EXPECTORATED SPUTUM ASSESSMENT W GRAM STAIN, RFLX TO RESP C

## 2012-06-16 LAB — BASIC METABOLIC PANEL
CO2: 30 mEq/L (ref 19–32)
Chloride: 89 mEq/L — ABNORMAL LOW (ref 96–112)
Creatinine, Ser: 0.55 mg/dL (ref 0.50–1.10)
Glucose, Bld: 147 mg/dL — ABNORMAL HIGH (ref 70–99)

## 2012-06-16 LAB — HIV ANTIBODY (ROUTINE TESTING W REFLEX): HIV: NONREACTIVE

## 2012-06-16 LAB — OSMOLALITY, URINE: Osmolality, Ur: 408 mOsm/kg (ref 390–1090)

## 2012-06-16 LAB — STREP PNEUMONIAE URINARY ANTIGEN: Strep Pneumo Urinary Antigen: NEGATIVE

## 2012-06-16 MED ORDER — AMLODIPINE BESYLATE 2.5 MG PO TABS: 2.5000 mg | ORAL_TABLET | Freq: Every day | ORAL | Status: DC

## 2012-06-16 MED ORDER — SODIUM CHLORIDE 0.9 % IV SOLN
INTRAVENOUS | Status: AC
  Administered 2012-06-16 – 2012-06-17 (×3): via INTRAVENOUS

## 2012-06-16 MED ORDER — BIOTENE DRY MOUTH MT LIQD
15.0000 mL | Freq: Two times a day (BID) | OROMUCOSAL | Status: DC
  Administered 2012-06-16 – 2012-06-18 (×3): 15 mL via OROMUCOSAL

## 2012-06-16 MED ORDER — AMLODIPINE BESYLATE 5 MG PO TABS
5.0000 mg | ORAL_TABLET | Freq: Every day | ORAL | Status: DC
  Administered 2012-06-17: 5 mg via ORAL
  Filled 2012-06-16: qty 1

## 2012-06-16 MED ORDER — SERTRALINE HCL 25 MG PO TABS
25.0000 mg | ORAL_TABLET | Freq: Every day | ORAL | Status: DC
  Administered 2012-06-17 – 2012-06-19 (×3): 25 mg via ORAL
  Filled 2012-06-16 (×3): qty 1

## 2012-06-16 MED ORDER — SERTRALINE HCL 25 MG PO TABS
25.0000 mg | ORAL_TABLET | Freq: Every day | ORAL | Status: DC
  Filled 2012-06-16: qty 1

## 2012-06-16 NOTE — Progress Notes (Signed)
[facility] PROGRESS NOTE  Jill White ZOX:[id number] DOB: 11/17/1940 DOA: 06/15/2012 PCP: Jill Salisbury, MD  Assessment/Plan: Acute hypoxic respiratory failure  Etiology unclear. Suspect upper respiratory viral with presumed COPD exacerbation.  Continue Solu-Medrol (transitioned to dexamethasone taper or Solu-Medrol Dosepak taper at discharge, patient has White penicillin allergy as it has caused bleeding in the past), continue neb treatments.  Wean off oxygen as tolerated.  Will likely will need the saturations steady partner discharge. Blood cultures x2, influenza PCR, urine strep pneumo, and urine Legionella pending.  HIV nonreactive.  Continue the patient on levofloxacin, discontinue vancomycin, and ceftriaxone.  No efficacy with Tamiflu as patient out of therapeutic window given she has had more than 5 days if symptoms prior to presentation.  COPD exacerbation  Management as indicated above.   Dehydration  Likely due to poor by mouth intake and persistent nausea.  Continue IV hydration as patient has had only minimal urine output since yesterday.  Hyponatremia  Due to nausea and dehydration. Continue to monitor.  Sent urine sodium, creatinine, osmolarity and plasma osmolarity for further evaluation.  Diarrhea  Likely due to viral gastroenteritis. Stool culture and C. difficile PCR pending. IV hydration as indicated above.   Nausea Improved.  Tolerating oral intake  Tobacco use  Counseled the patient on cessation.  Currently on nicotine patch.   Hypertension  Better controlled, continue home antihypertensive medications. Continue to monitor.   Restless leg syndrome  Continue to monitor.   Prophylaxis  Lovenox.  Code Status: Full code. Family Communication: Granddaughter updated at bedside. Disposition Plan: Transition the patient out of stepdown to MedSurg.  Consultants:  None  Procedures:  None  Antibiotics:  Vancomycin 06/15/2012 >>  06/16/2012  Ceftriaxone 06/15/2012 >> 06/16/2012  Levofloxacin 06/15/2012 >>  HPI/Subjective: Breathing better.  Feeling better.  Objective: Filed Vitals:   06/16/12 0000 06/16/12 0055 06/16/12 0400 06/16/12 0500  BP: 96/56 109/71 105/46   Pulse: 75 78 65   Temp:      TempSrc:      Resp: 19 23 17    Height:      Weight:    60.5 kg (133 lb 6.1 oz)  SpO2: 97% 96% 96%     Intake/Output Summary (Last 24 hours) at 06/16/12 0851 Last data filed at 06/16/12 0700  Gross per 24 hour  Intake 1142.75 ml  Output      0 ml  Net 1142.75 ml   Filed Weights   06/15/12 1700 06/16/12 0500  Weight: 61.236 kg (135 lb) 60.5 kg (133 lb 6.1 oz)    Exam: Physical Exam: General: Awake, Oriented, No acute distress. HEENT: EOMI. Neck: Supple CV: S1 and S2 Lungs: Moderate air movement bilaterally, scattered expiratory wheezing improved from yesterday. Abdomen: Soft, Nontender, Nondistended, +bowel sounds. Ext: Good pulses. Trace edema.  Data Reviewed: Basic Metabolic Panel:  Lab 06/16/12 8119 06/15/12 1805  NA 124* 130*  K 4.8 4.1  CL 89* 91*  CO2 30 31  GLUCOSE 147* 93  BUN 8 6  CREATININE 0.55 0.47*  CALCIUM 8.2* 8.9  MG -- --  PHOS -- --   Liver Function Tests:  Lab 06/15/12 1805  AST 29  ALT 35  ALKPHOS 95  BILITOT 0.4  PROT 6.8  ALBUMIN 3.2*   No results found for this basename: LIPASE:5,AMYLASE:5 in the last 168 hours No results found for this basename: AMMONIA:5 in the last 168 hours CBC:  Lab 06/16/12 0350 06/15/12 1805  WBC 3.9* 5.7  NEUTROABS -- --  HGB 15.9* 16.8*  HCT 46.3* 48.6*  MCV 96.7 96.8  PLT 247 244   Cardiac Enzymes: No results found for this basename: CKTOTAL:5,CKMB:5,CKMBINDEX:5,TROPONINI:5 in the last 168 hours BNP (last 3 results) No results found for this basename: PROBNP:3 in the last 8760 hours CBG: No results found for this basename: GLUCAP:5 in the last 168 hours  Recent Results (from the past 240 hour(s))  MRSA PCR SCREENING      Status: Normal   Collection Time   06/15/12  5:47 PM      Component Value Range Status Comment   MRSA by PCR NEGATIVE  NEGATIVE Final      Studies: Dg Chest Port 1 View  06/15/2012  *RADIOLOGY REPORT*  Clinical Data: Short of breath.  COPD.  PORTABLE CHEST - 1 VIEW  Comparison: 08/10/2006  Findings: Artifact overlies the chest.  Heart size is at the upper limits of normal.  The aorta shows calcification and unfolding. There appears to be venous hypertension, possibly with early interstitial edema.  No consolidation, collapse or effusion.  IMPRESSION: Interstitial prominence suggesting early interstitial edema.   Original Report Authenticated By: Jill White, M.D.     Scheduled Meds:   . albuterol  2.5 mg Nebulization Q6H  . amLODipine  5 mg Oral Daily  . aspirin  81 mg Oral Daily  . calcium-vitamin D  1 tablet Oral Daily  . cefTRIAXone (ROCEPHIN)  IV  1 g Intravenous Q24H  . clonazePAM  0.5 mg Oral QHS  . enoxaparin (LOVENOX) injection  40 mg Subcutaneous Q24H  . guaiFENesin  600 mg Oral BID  . ipratropium  0.5 mg Nebulization Q6H  . levofloxacin (LEVAQUIN) IV  750 mg Intravenous Q24H  . losartan  50 mg Oral Daily  . methylPREDNISolone (SOLU-MEDROL) injection  40 mg Intravenous Q12H  . nicotine  14 mg Transdermal Daily  . pantoprazole  40 mg Oral BID  . sertraline  50 mg Oral Daily  . vancomycin  1,000 mg Intravenous Q24H  . vitamin E  100 Units Oral Daily   Continuous Infusions:   . sodium chloride      Principal Problem:  *Acute respiratory failure Active Problems:  HYPERTENSION  Hyponatremia  RLS (restless legs syndrome)  Tobacco abuse  Dehydration  Diarrhea  COPD exacerbation    Time spent: 25 minutes    Jill White  [facility] Pager [phone]. If 8PM-8AM, please contact night-coverage at [email or web address], password Jill White 06/16/2012, 8:51 AM  LOS: 1 day

## 2012-06-17 DIAGNOSIS — E860 Dehydration: Secondary | ICD-10-CM

## 2012-06-17 DIAGNOSIS — J9600 Acute respiratory failure, unspecified whether with hypoxia or hypercapnia: Principal | ICD-10-CM

## 2012-06-17 DIAGNOSIS — R197 Diarrhea, unspecified: Secondary | ICD-10-CM

## 2012-06-17 DIAGNOSIS — J441 Chronic obstructive pulmonary disease with (acute) exacerbation: Secondary | ICD-10-CM

## 2012-06-17 LAB — CBC
HCT: 44.1 % (ref 36.0–46.0)
Hemoglobin: 14.7 g/dL (ref 12.0–15.0)
MCH: 32.2 pg (ref 26.0–34.0)
MCHC: 33.3 g/dL (ref 30.0–36.0)
RBC: 4.56 MIL/uL (ref 3.87–5.11)

## 2012-06-17 LAB — BASIC METABOLIC PANEL
BUN: 8 mg/dL (ref 6–23)
CO2: 27 mEq/L (ref 19–32)
Calcium: 8.3 mg/dL — ABNORMAL LOW (ref 8.4–10.5)
GFR calc non Af Amer: >90 mL/min (ref >90)
Glucose, Bld: 146 mg/dL — ABNORMAL HIGH (ref 70–99)
Sodium: 128 mEq/L — ABNORMAL LOW (ref 135–145)

## 2012-06-17 LAB — LEGIONELLA ANTIGEN, URINE

## 2012-06-17 MED ORDER — LORAZEPAM 2 MG/ML IJ SOLN
1.0000 mg | Freq: Once | INTRAMUSCULAR | Status: AC
  Administered 2012-06-17: 1 mg via INTRAVENOUS
  Filled 2012-06-17: qty 1

## 2012-06-17 MED ORDER — NICOTINE 7 MG/24HR TD PT24
7.0000 mg | MEDICATED_PATCH | Freq: Every day | TRANSDERMAL | Status: DC
  Administered 2012-06-18 – 2012-06-19 (×2): 7 mg via TRANSDERMAL
  Filled 2012-06-17 (×2): qty 1

## 2012-06-17 MED ORDER — SODIUM CHLORIDE 0.9 % IV SOLN: INTRAVENOUS | Status: DC

## 2012-06-17 NOTE — Progress Notes (Signed)
[facility] PROGRESS NOTE  Jill White BJY:[id number] DOB: [birth year] DOA: 06/15/2012 PCP: Nelwyn Salisbury, MD  Assessment/Plan: Principal Problem:  *Acute respiratory failure Active Problems:  HYPERTENSION  Hyponatremia  RLS (restless legs syndrome)  Tobacco abuse  Dehydration  Diarrhea  COPD exacerbation    1. Acute hypoxic respiratory failure:  Patient presented with a productive cough, SOB and was found to be hypoxic, with sats in the 80s, in PMD's office. Physical examination revealed expiratory wheeze. This clinical constellation is consistent with an acute bronchitis, and may possibly be of viral etiology. Managing with Levaquin now day#3, mucolytics and bronchodilators. Patient was initially placed on parenteral steroids, but has been unable to tolerate this, besides, she did have GI bleed, due to Prednisone in the remote past, and has gastric ulcer diagnosed 06/2011. Steroids have therefore, been discontinued. Flu PCR is negative, as are blood cultures and urine strep pneumoniae antigen. Saturation is 925-98% on 2L oxygen. Wean off oxygen as tolerated. Will likely will need the saturations steady partner discharge. Urine Legionella antigen is pending. HIV test is nonreactive. 2. Tobacco abuse/COPD exacerbation: Patient is a long-standing smoker, and appears to have no intention of quiting. She claims to smoke only about 2-3 cigarettes per day. Counseled. She may have an exacerbation, due to #1.  Management as indicated above.  3. Acute Gastroenteritis: Pre-admission, patient had  Some vomiting and diarrhea, as well as sick contact, with family members with a similar illness. Per patient, these symptoms have now resolved, with the last episode of diarrhea on 06/14/12. She is now left wit residual nausea, but is able to tolerate diet. Fortunately, C. Difficile PCR was negative. On supportive management.  4. Dehydration/Hyponatremia:  Clinical dehydration was apparent on  presentation, due to GI fluid losses, poor by oral intake, persistent nausea, against a background of continued diuretic therapy. Managing with iv NS an, and hydration has improved. Diuretic is on hold. Urine osmolarity is 408, while serum osmolality is 276. 5. Hypertension: BP is currently borderline low. Pre-admission anti-hypertensive medications have been placed on hold. Continue to monitor.  6. Restless leg syndrome:  Continue to monitor.    Code Status: Full Code. Family Communication:  Disposition Plan: To be determined.    Brief narrative: 71 y.o. Caucasian female with history of hypertension, RLS, COPD, peptic ulcer disease and tobacco use, who presents with SOB. Patient reports that few weeks ago, there was a "stomach flu" going around her family members. On 06/10/2012 she had a fever of 101.8-102 with some sinus congestion and diarrhea. Initially patient thought that she may have flu-like symptoms. She then visited the mountains over [place], however her shortness of breath and flulike symptoms continue to persist, as a result of which she presented to her primary care physician's office and saw Ms. Orvan Falconer, NP. She was found to be hypoxic with saturation of 82, with some wheezing, as a result the hospitalist service was contacted to admit the patient from her primary care physician's office.   Consultants:  N/A.   Procedures:  CXR  Antibiotics:  Vancomycin 06/15/12 only.  Rocephin 06/15/12 only.   Levaquin 06/15/12>>>.   HPI/Subjective: No new issues, apart from frequent cough.   Objective: Vital signs in last 24 hours: Temp:  [97.6 F (36.4 C)-98.2 F (36.8 C)] 97.6 F (36.4 C) (12/29 0865) Pulse Rate:  [65-73] 65  (12/29 0632) Resp:  [18] 18  (12/29 7846) BP: (95-96)/(62-67) 95/67 mmHg (12/29 0632) SpO2:  [92 %-93 %] 92 % (12/29 0900) Weight  change: -4.082 kg (-9 lb) Last BM Date: 06/16/12  Intake/Output from previous day: 12/28 0701 - 12/29 0700 In:  1080 [P.O.:480; I.V.:600] Out: 1010 [Urine:1010]     Physical Exam: General: Comfortable, alert, communicative, fully oriented, not short of breath at rest.  HEENT:  No clinical pallor, no jaundice, no conjunctival injection or discharge. Hydration status is fair.  NECK:  Supple, JVP not seen, no carotid bruits, no palpable lymphadenopathy, no palpable goiter. CHEST:  Bilateral wheezes, no crackles. HEART:  Sounds 1 and 2 heard, normal, regular, no murmurs. ABDOMEN:  Full, soft, non-tender, no palpable organomegaly, no palpable masses, normal bowel sounds. GENITALIA:  Not examined. LOWER EXTREMITIES:  No pitting edema, palpable peripheral pulses. MUSCULOSKELETAL SYSTEM:  Generalized osteoarthritic changes, otherwise, normal. CENTRAL NERVOUS SYSTEM:  No focal neurologic deficit on gross examination.  Lab Results:  [facility] 06/17/12 0512 06/16/12 0350  WBC 6.3 3.9*  HGB 14.7 15.9*  HCT 44.1 46.3*  PLT 288 247    Basename 06/17/12 0512 06/16/12 0350  NA 128* 124*  K 4.5 4.8  CL 92* 89*  CO2 27 30  GLUCOSE 146* 147*  BUN 8 8  CREATININE 0.46* 0.55  CALCIUM 8.3* 8.2*   Recent Results (from the past 240 hour(s))  MRSA PCR SCREENING     Status: Normal   Collection Time   06/15/12  5:47 PM      Component Value Range Status Comment   MRSA by PCR NEGATIVE  NEGATIVE Final   CULTURE, EXPECTORATED SPUTUM-ASSESSMENT     Status: Normal   Collection Time   06/16/12  9:10 AM      Component Value Range Status Comment   Specimen Description SPUTUM   Final    Special Requests NONE   Final    Sputum evaluation     Final    Value: THIS SPECIMEN IS ACCEPTABLE. RESPIRATORY CULTURE REPORT TO FOLLOW.   Report Status 06/16/2012 FINAL   Final   CULTURE, RESPIRATORY     Status: Normal (Preliminary result)   Collection Time   06/16/12  9:10 AM      Component Value Range Status Comment   Specimen Description SPUTUM   Final    Special Requests NONE   Final    Gram Stain PENDING   Incomplete     Culture Culture reincubated for better growth   Final    Report Status PENDING   Incomplete   CLOSTRIDIUM DIFFICILE BY PCR     Status: Normal   Collection Time   06/16/12  2:08 PM      Component Value Range Status Comment   C difficile by pcr NEGATIVE  NEGATIVE Final      Studies/Results: Dg Chest Port 1 View  06/15/2012  *RADIOLOGY REPORT*  Clinical Data: Short of breath.  COPD.  PORTABLE CHEST - 1 VIEW  Comparison: 08/10/2006  Findings: Artifact overlies the chest.  Heart size is at the upper limits of normal.  The aorta shows calcification and unfolding. There appears to be venous hypertension, possibly with early interstitial edema.  No consolidation, collapse or effusion.  IMPRESSION: Interstitial prominence suggesting early interstitial edema.   Original Report Authenticated By: Paulina Fusi, M.D.     Medications: Scheduled Meds:   . albuterol  2.5 mg Nebulization Q6H  . amLODipine  5 mg Oral Daily  . antiseptic oral rinse  15 mL Mouth Rinse BID  . aspirin  81 mg Oral Daily  . calcium-vitamin D  1 tablet Oral Daily  .  clonazePAM  0.5 mg Oral QHS  . enoxaparin (LOVENOX) injection  40 mg Subcutaneous Q24H  . guaiFENesin  600 mg Oral BID  . ipratropium  0.5 mg Nebulization Q6H  . levofloxacin (LEVAQUIN) IV  750 mg Intravenous Q24H  . methylPREDNISolone (SOLU-MEDROL) injection  40 mg Intravenous Q12H  . nicotine  14 mg Transdermal Daily  . pantoprazole  40 mg Oral BID  . sertraline  25 mg Oral Daily  . vitamin E  100 Units Oral Daily   Continuous Infusions:  PRN Meds:.acetaminophen, acetaminophen, albuterol, guaiFENesin-dextromethorphan, ondansetron (ZOFRAN) IV, ondansetron    LOS: 2 days   Kourosh Jablonsky,CHRISTOPHER  [facility] Pager [phone]. If 8PM-8AM, please contact night-coverage at [email or web address], password [facility] 06/17/2012, 1:37 PM  LOS: 2 days

## 2012-06-17 NOTE — Progress Notes (Signed)
Jill White has verbalized not wanting to take soludmedrol. She became very anxious and restless last night. She was not able to sleep. She was given a one time dose of iv ativan which seems to have helped tremendously.

## 2012-06-18 ENCOUNTER — Other Ambulatory Visit: Payer: Self-pay | Admitting: Family Medicine

## 2012-06-18 DIAGNOSIS — F17200 Nicotine dependence, unspecified, uncomplicated: Secondary | ICD-10-CM

## 2012-06-18 LAB — CBC
Hemoglobin: 15.2 g/dL — ABNORMAL HIGH (ref 12.0–15.0)
MCH: 32.8 pg (ref 26.0–34.0)
MCHC: 33.5 g/dL (ref 30.0–36.0)
MCV: 97.8 fL (ref 78.0–100.0)
Platelets: 326 10*3/uL (ref 150–400)
RBC: 4.64 MIL/uL (ref 3.87–5.11)

## 2012-06-18 LAB — BASIC METABOLIC PANEL
CO2: 26 mEq/L (ref 19–32)
Calcium: 8.2 mg/dL — ABNORMAL LOW (ref 8.4–10.5)
Creatinine, Ser: 0.52 mg/dL (ref 0.50–1.10)
GFR calc non Af Amer: >90 mL/min (ref >90)
Glucose, Bld: 88 mg/dL (ref 70–99)
Sodium: 128 mEq/L — ABNORMAL LOW (ref 135–145)

## 2012-06-18 LAB — CULTURE, RESPIRATORY W GRAM STAIN: Culture: NORMAL

## 2012-06-18 MED ORDER — POLYETHYLENE GLYCOL 3350 17 G PO PACK
17.0000 g | PACK | Freq: Every day | ORAL | Status: DC | PRN
  Filled 2012-06-18: qty 1

## 2012-06-18 MED ORDER — LEVOFLOXACIN 500 MG PO TABS
500.0000 mg | ORAL_TABLET | Freq: Every day | ORAL | Status: DC
  Filled 2012-06-18: qty 1

## 2012-06-18 NOTE — Discharge Summary (Signed)
Physician Discharge Summary  Jill White ZOX:[id number] DOB: 04-23-1941 DOA: 06/15/2012  PCP: Nelwyn Salisbury, MD  Admit date: 06/15/2012 Discharge date: 06/19/2012  Time spent: 40 minutes  Recommendations for Outpatient Follow-up:  1. Follow up with primary MD.   Discharge Diagnoses:  Principal Problem:  *Acute respiratory failure Active Problems:  HYPERTENSION  Hyponatremia  RLS (restless legs syndrome)  Tobacco abuse  Dehydration  Diarrhea  COPD exacerbation   Discharge Condition: Satisfactory.   Diet recommendation: Regular/1,200 ml/day fluid restriction.   Filed Weights   06/15/12 1700 06/16/12 0500 06/16/12 1300  Weight: 61.236 kg (135 lb) 60.5 kg (133 lb 6.1 oz) 57.153 kg (126 lb)    History of present illness:  71 y.o. Caucasian female with history of hypertension, RLS, COPD, peptic ulcer disease and tobacco use, who presents with SOB. Patient reports that few weeks ago, there was a "stomach flu" going around her family members. On 06/10/2012 she had a fever of 101.8-102 with some sinus congestion and diarrhea. Initially patient thought that she may have flu-like symptoms. She then visited the mountains over Christmas, however her shortness of breath and flulike symptoms continue to persist, as a result of which she presented to her primary care physician's office and saw Ms. Orvan Falconer, NP. She was found to be hypoxic with saturation of 82, with some wheezing, as a result the hospitalist service was contacted to admit the patient from her primary care physician's office.    Hospital Course:  1. Acute hypoxic respiratory failure: Patient presented with a productive cough, SOB and was found to be hypoxic, with sats in the 80s, in PMD's office. Physical examination revealed expiratory wheeze. This clinical constellation is consistent with an acute bronchitis, and may possibly be of viral etiology. She was managed with Levaquin, mucolytics and bronchodilators. Patient was  initially placed on parenteral steroids, but was unable to tolerate this, besides, she did have GI bleed due to Prednisone in the remote past, and a gastric ulcer diagnosed 06/2011. Steroids were  therefore, discontinued. Flu PCR was negative, as were blood cultures and urine strep pneumoniae and Legionella antigen. HIV test was non-reactive, and saturations improved on supplemental oxygen. As of 06/19/12, patient was saturating at 91-93% on room air, even on ambulation.  2. Tobacco abuse/COPD exacerbation: Patient is a long-standing smoker, and appears to have no intention of quiting. She claims to smoke only about 2-3 cigarettes per day, was counseled appropriately and placed on Nicoderm CQ, during her hospitalization. She appears to have had an exacerbation of COPD, due to #1, which responded to management as outlined above. Combivent inhaler has been prescribed for prn use.  3. Acute Gastroenteritis: Pre-admission, patient had some vomiting and diarrhea, as well as sick contact with family members with a similar illness. These symptoms have now resolved with supportive management and as a matter of fact the last episode of diarrhea was on 06/14/12. Fortunately, C. Difficile PCR was negative. Diet was rapidly advanced without deleterious effect.   4. Dehydration/Hyponatremia:  Clinical dehydration was apparent on presentation, due to GI fluid losses, poor oral intake, persistent nausea, against a background of continued diuretic therapy. Managed with iv NS and hydration improved. Diuretic is on hold. Urine osmolarity was 408 and serum osmolality 276, suggestive of SIADH. Patient has been recommended fluid restriction of 1200 ml/day, effective 06/18/12, and sodium was 131 on 06/19/12.  5. Hypertension: BP was initially low at presentation, with SBP in the 90s. Pre-admission anti-hypertensive medications have been placed on hold, and  BP has normalized. We have deferred reinstatement of anti-hypertensives to  PMD, on follow up.  6. Restless leg syndrome: Stable/Not problematic.    Procedures:  See Below.   Consultations:  N/A.   Discharge Exam: Filed Vitals:   06/18/12 2057 06/19/12 0623 06/19/12 0840 06/19/12 1024  BP:  116/80  120/76  Pulse:  66  82  Temp:  98.4 F (36.9 C)    TempSrc:  Oral    Resp:  20    Height:      Weight:      SpO2: 90% 92% 91% 93%    General: Comfortable, alert, communicative, fully oriented, not short of breath at rest.  HEENT: No clinical pallor, no jaundice, no conjunctival injection or discharge. Hydration status is fair.  NECK: Supple, JVP not seen, no carotid bruits, no palpable lymphadenopathy, no palpable goiter.  CHEST: Clear. No wheezes, no crackles.  HEART: Sounds 1 and 2 heard, normal, regular, no murmurs.  ABDOMEN: Full, soft, non-tender, no palpable organomegaly, no palpable masses, normal bowel sounds.  GENITALIA: Not examined.  LOWER EXTREMITIES: No pitting edema, palpable peripheral pulses.  MUSCULOSKELETAL SYSTEM: Generalized osteoarthritic changes, otherwise, normal.  CENTRAL NERVOUS SYSTEM: No focal neurologic deficit on gross examination.  Discharge Instructions      Discharge Orders    Future Orders Please Complete By Expires   Diet general      Increase activity slowly          Medication List     As of 06/19/2012  1:20 PM    STOP taking these medications         amLODipine 5 MG tablet   Commonly known as: NORVASC      furosemide 20 MG tablet   Commonly known as: LASIX      losartan 50 MG tablet   Commonly known as: COZAAR      ondansetron 4 MG tablet   Commonly known as: ZOFRAN      TAKE these medications         albuterol-ipratropium 18-103 MCG/ACT inhaler   Commonly known as: COMBIVENT   Inhale 2 puffs into the lungs every 6 (six) hours as needed for wheezing.      aspirin 81 MG tablet   Take 81 mg by mouth daily.      calcium-vitamin D 500-200 MG-UNIT per tablet   Commonly known as: OSCAL WITH  D   Take 1 tablet by mouth daily.      clonazePAM 0.5 MG tablet   Commonly known as: KLONOPIN   Take 1 tablet (0.5 mg total) by mouth at bedtime.      guaiFENesin 600 MG 12 hr tablet   Commonly known as: MUCINEX   Take 1 tablet (600 mg total) by mouth 2 (two) times daily.      levofloxacin 500 MG tablet   Commonly known as: LEVAQUIN   Take 1 tablet (500 mg total) by mouth daily at 6 PM.      sertraline 50 MG tablet   Commonly known as: ZOLOFT   Take 25 mg by mouth daily.      vitamin E 100 UNIT capsule   Take 100 Units by mouth daily.         Follow-up Information    Schedule an appointment as soon as possible for a visit with Nelwyn Salisbury, MD.   Contact information:   [address] [place] [address] [phone]           The  results of significant diagnostics from this hospitalization (including imaging, microbiology, ancillary and laboratory) are listed below for reference.    Significant Diagnostic Studies: Dg Chest Port 1 View  06/15/2012  *RADIOLOGY REPORT*  Clinical Data: Short of breath.  COPD.  PORTABLE CHEST - 1 VIEW  Comparison: 08/10/2006  Findings: Artifact overlies the chest.  Heart size is at the upper limits of normal.  The aorta shows calcification and unfolding. There appears to be venous hypertension, possibly with early interstitial edema.  No consolidation, collapse or effusion.  IMPRESSION: Interstitial prominence suggesting early interstitial edema.   Original Report Authenticated By: Paulina Fusi, M.D.     Microbiology: Recent Results (from the past 240 hour(s))  MRSA PCR SCREENING     Status: Normal   Collection Time   06/15/12  5:47 PM      Component Value Range Status Comment   MRSA by PCR NEGATIVE  NEGATIVE Final   CULTURE, BLOOD (ROUTINE X 2)     Status: Normal (Preliminary result)   Collection Time   06/15/12  6:30 PM      Component Value Range Status Comment   Specimen Description BLOOD LEFT HAND   Final    Special  Requests BOTTLES DRAWN AEROBIC ONLY 10CC   Final    Culture  Setup Time 06/16/2012 02:44   Final    Culture     Final    Value:        BLOOD CULTURE RECEIVED NO GROWTH TO DATE CULTURE WILL BE HELD FOR 5 DAYS BEFORE ISSUING A FINAL NEGATIVE REPORT   Report Status PENDING   Incomplete   CULTURE, BLOOD (ROUTINE X 2)     Status: Normal (Preliminary result)   Collection Time   06/15/12  6:42 PM      Component Value Range Status Comment   Specimen Description BLOOD LEFT ARM   Final    Special Requests BOTTLES DRAWN AEROBIC AND ANAEROBIC 10CC   Final    Culture  Setup Time 06/16/2012 02:44   Final    Culture     Final    Value:        BLOOD CULTURE RECEIVED NO GROWTH TO DATE CULTURE WILL BE HELD FOR 5 DAYS BEFORE ISSUING A FINAL NEGATIVE REPORT   Report Status PENDING   Incomplete   CULTURE, EXPECTORATED SPUTUM-ASSESSMENT     Status: Normal   Collection Time   06/16/12  9:10 AM      Component Value Range Status Comment   Specimen Description SPUTUM   Final    Special Requests NONE   Final    Sputum evaluation     Final    Value: THIS SPECIMEN IS ACCEPTABLE. RESPIRATORY CULTURE REPORT TO FOLLOW.   Report Status 06/16/2012 FINAL   Final   CULTURE, RESPIRATORY     Status: Normal   Collection Time   06/16/12  9:10 AM      Component Value Range Status Comment   Specimen Description SPUTUM   Final    Special Requests NONE   Final    Gram Stain     Final    Value: NO WBC SEEN     NO SQUAMOUS EPITHELIAL CELLS SEEN     NO ORGANISMS SEEN   Culture NORMAL OROPHARYNGEAL FLORA   Final    Report Status 06/18/2012 FINAL   Final   CLOSTRIDIUM DIFFICILE BY PCR     Status: Normal   Collection Time   06/16/12  2:08 PM  Component Value Range Status Comment   C difficile by pcr NEGATIVE  NEGATIVE Final      Labs: Basic Metabolic Panel:  Lab 06/19/12 1610 06/18/12 0506 06/17/12 0512 06/16/12 0350 06/15/12 1805  NA 131* 128* 128* 124* 130*  K 4.4 4.2 4.5 4.8 4.1  CL 95* 94* 92* 89* 91*  CO2  29 26 27 30 31   GLUCOSE 88 88 146* 147* 93  BUN 7 8 8 8 6   CREATININE 0.47* 0.52 0.46* 0.55 0.47*  CALCIUM 8.2* 8.2* 8.3* 8.2* 8.9  MG -- -- -- -- --  PHOS -- -- -- -- --   Liver Function Tests:  Lab 06/15/12 1805  AST 29  ALT 35  ALKPHOS 95  BILITOT 0.4  PROT 6.8  ALBUMIN 3.2*   No results found for this basename: LIPASE:5,AMYLASE:5 in the last 168 hours No results found for this basename: AMMONIA:5 in the last 168 hours CBC:  Lab 06/19/12 0513 06/18/12 0506 06/17/12 0512 06/16/12 0350 06/15/12 1805  WBC 6.1 8.1 6.3 3.9* 5.7  NEUTROABS -- -- -- -- --  HGB 15.0 15.2* 14.7 15.9* 16.8*  HCT 44.3 45.4 44.1 46.3* 48.6*  MCV 96.1 97.8 96.7 96.7 96.8  PLT 335 326 288 247 244   Cardiac Enzymes: No results found for this basename: CKTOTAL:5,CKMB:5,CKMBINDEX:5,TROPONINI:5 in the last 168 hours BNP: BNP (last 3 results) No results found for this basename: PROBNP:3 in the last 8760 hours CBG: No results found for this basename: GLUCAP:5 in the last 168 hours     Signed:  Teddy Pena,CHRISTOPHER  [facility] 06/19/2012, 1:20 PM

## 2012-06-18 NOTE — Progress Notes (Signed)
UR completed 

## 2012-06-18 NOTE — Progress Notes (Signed)
[facility] PROGRESS NOTE  Jill White ZOX:[id number] DOB: [birth year] DOA: 06/15/2012 PCP: Nelwyn Salisbury, MD  Assessment/Plan: Principal Problem:  *Acute respiratory failure Active Problems:  HYPERTENSION  Hyponatremia  RLS (restless legs syndrome)  Tobacco abuse  Dehydration  Diarrhea  COPD exacerbation    1. Acute hypoxic respiratory failure:  Patient presented with a productive cough, SOB and was found to be hypoxic, with sats in the 80s, in PMD's office. Physical examination revealed expiratory wheeze. This clinical constellation is consistent with an acute bronchitis, and may possibly be of viral etiology. Managing with Levaquin now day#4, mucolytics and bronchodilators. Patient was initially placed on parenteral steroids, but has been unable to tolerate this, besides, she did have GI bleed, due to Prednisone in the remote past, and has gastric ulcer diagnosed 06/2011. Steroids have therefore, been discontinued. Flu PCR is negative, as are blood cultures and urine strep pneumoniae and Legionella antigen. HIV test is nonreactive. Saturation is 95-99% on 2L oxygen. Wean off oxygen as tolerated. Patient feels better today. Will check saturations for possible Short-term home O2, prior to discharge, if indicated.  2. Tobacco abuse/COPD exacerbation: Patient is a long-standing smoker, and appears to have no intention of quiting. She claims to smoke only about 2-3 cigarettes per day. Counseled. She may have an exacerbation, due to #1. Management as indicated above. Patient is on Nicoderm CQ.  3. Acute Gastroenteritis: Pre-admission, patient had  Some vomiting and diarrhea, as well as sick contact, with family members with a similar illness. Per patient, these symptoms have now resolved, with the last episode of diarrhea on 06/14/12. Fortunately, C. Difficile PCR was negative. Now asymptomatic and toleration diet. Have discontinued iv fluids today.  4. Dehydration/Hyponatremia:  Clinical  dehydration was apparent on presentation, due to GI fluid losses, poor by oral intake, persistent nausea, against a background of continued diuretic therapy. Managing with iv NS an, and hydration has improved. Diuretic is on hold. Urine osmolarity is 408, while serum osmolality is 276, suggestive of SIADH. Will place on fluid restriction effective today.  5. Hypertension: BP was initially low, with SBP in the 90s. Pre-admission anti-hypertensive medications have been placed on hold, and BP has normalized. May need to defer reinstatement of anti-hypertensives to PMD, on follow up. Continue to monitor.  6. Restless leg syndrome: Stable/Not problematic.    Code Status: Full Code. Family Communication:  Disposition Plan: To be determined. Possible discharge on 06/19/12.    Brief narrative: 71 y.o. Caucasian female with history of hypertension, RLS, COPD, peptic ulcer disease and tobacco use, who presents with SOB. Patient reports that few weeks ago, there was a "stomach flu" going around her family members. On 06/10/2012 she had a fever of 101.8-102 with some sinus congestion and diarrhea. Initially patient thought that she may have flu-like symptoms. [place], however her shortness of breath and flulike symptoms continue to persist, as a result of which she presented to her primary care physician's office and saw Ms. Orvan Falconer, NP. She was found to be hypoxic with saturation of 82, with some wheezing, as a result the hospitalist service was contacted to admit the patient from her primary care physician's office.   Consultants:  N/A.   Procedures:  CXR  Antibiotics:  Vancomycin 06/15/12 only.  Rocephin 06/15/12 only.   Levaquin 06/15/12>>>.   HPI/Subjective: Feels better. Now constipated.   Objective: Vital signs in last 24 hours: Temp:  [97.5 F (36.4 C)-98.6 F (37 C)] 98.2 F (36.8  C) (12/30 0710) Pulse Rate:  [67-99] 99  (12/30 0710) Resp:   [18] 18  (12/30 0710) BP: (90-128)/(47-88) 128/88 mmHg (12/30 1127) SpO2:  [86 %-99 %] 86 % (12/30 0910) Weight change:  Last BM Date: 06/16/12  Intake/Output from previous day: 12/29 0701 - 12/30 0700 In: 1400 [I.V.:1400] Out: 1450 [Urine:1450] Total I/O In: 240 [P.O.:240] Out: -    Physical Exam: General: Comfortable, alert, communicative, fully oriented, not short of breath at rest.  HEENT:  No clinical pallor, no jaundice, no conjunctival injection or discharge. Hydration status is fair.  NECK:  Supple, JVP not seen, no carotid bruits, no palpable lymphadenopathy, no palpable goiter. CHEST:  Clear. No wheezes, no crackles. HEART:  Sounds 1 and 2 heard, normal, regular, no murmurs. ABDOMEN:  Full, soft, non-tender, no palpable organomegaly, no palpable masses, normal bowel sounds. GENITALIA:  Not examined. LOWER EXTREMITIES:  No pitting edema, palpable peripheral pulses. MUSCULOSKELETAL SYSTEM:  Generalized osteoarthritic changes, otherwise, normal. CENTRAL NERVOUS SYSTEM:  No focal neurologic deficit on gross examination.  Lab Results:  [facility] 06/18/12 0506 06/17/12 0512  WBC 8.1 6.3  HGB 15.2* 14.7  HCT 45.4 44.1  PLT 326 288    Basename 06/18/12 0506 06/17/12 0512  NA 128* 128*  K 4.2 4.5  CL 94* 92*  CO2 26 27  GLUCOSE 88 146*  BUN 8 8  CREATININE 0.52 0.46*  CALCIUM 8.2* 8.3*   Recent Results (from the past 240 hour(s))  MRSA PCR SCREENING     Status: Normal   Collection Time   06/15/12  5:47 PM      Component Value Range Status Comment   MRSA by PCR NEGATIVE  NEGATIVE Final   CULTURE, BLOOD (ROUTINE X 2)     Status: Normal (Preliminary result)   Collection Time   06/15/12  6:30 PM      Component Value Range Status Comment   Specimen Description BLOOD LEFT HAND   Final    Special Requests BOTTLES DRAWN AEROBIC ONLY 10CC   Final    Culture  Setup Time 06/16/2012 02:44   Final    Culture     Final    Value:        BLOOD CULTURE RECEIVED NO GROWTH TO  DATE CULTURE WILL BE HELD FOR 5 DAYS BEFORE ISSUING A FINAL NEGATIVE REPORT   Report Status PENDING   Incomplete   CULTURE, BLOOD (ROUTINE X 2)     Status: Normal (Preliminary result)   Collection Time   06/15/12  6:42 PM      Component Value Range Status Comment   Specimen Description BLOOD LEFT ARM   Final    Special Requests BOTTLES DRAWN AEROBIC AND ANAEROBIC 10CC   Final    Culture  Setup Time 06/16/2012 02:44   Final    Culture     Final    Value:        BLOOD CULTURE RECEIVED NO GROWTH TO DATE CULTURE WILL BE HELD FOR 5 DAYS BEFORE ISSUING A FINAL NEGATIVE REPORT   Report Status PENDING   Incomplete   CULTURE, EXPECTORATED SPUTUM-ASSESSMENT     Status: Normal   Collection Time   06/16/12  9:10 AM      Component Value Range Status Comment   Specimen Description SPUTUM   Final    Special Requests NONE   Final    Sputum evaluation     Final    Value: THIS SPECIMEN IS ACCEPTABLE. RESPIRATORY CULTURE REPORT TO FOLLOW.  Report Status 06/16/2012 FINAL   Final   CULTURE, RESPIRATORY     Status: Normal   Collection Time   06/16/12  9:10 AM      Component Value Range Status Comment   Specimen Description SPUTUM   Final    Special Requests NONE   Final    Gram Stain     Final    Value: NO WBC SEEN     NO SQUAMOUS EPITHELIAL CELLS SEEN     NO ORGANISMS SEEN   Culture NORMAL OROPHARYNGEAL FLORA   Final    Report Status 06/18/2012 FINAL   Final   CLOSTRIDIUM DIFFICILE BY PCR     Status: Normal   Collection Time   06/16/12  2:08 PM      Component Value Range Status Comment   C difficile by pcr NEGATIVE  NEGATIVE Final      Studies/Results: No results found.  Medications: Scheduled Meds:    . albuterol  2.5 mg Nebulization Q6H  . antiseptic oral rinse  15 mL Mouth Rinse BID  . aspirin  81 mg Oral Daily  . calcium-vitamin D  1 tablet Oral Daily  . clonazePAM  0.5 mg Oral QHS  . enoxaparin (LOVENOX) injection  40 mg Subcutaneous Q24H  . guaiFENesin  600 mg Oral BID  .  ipratropium  0.5 mg Nebulization Q6H  . levofloxacin (LEVAQUIN) IV  750 mg Intravenous Q24H  . nicotine  7 mg Transdermal Daily  . pantoprazole  40 mg Oral BID  . sertraline  25 mg Oral Daily  . vitamin E  100 Units Oral Daily   Continuous Infusions:    . sodium chloride 75 mL/hr at 06/17/12 1516   PRN Meds:.acetaminophen, acetaminophen, albuterol, guaiFENesin-dextromethorphan, ondansetron (ZOFRAN) IV, ondansetron    LOS: 3 days   Harvey Lingo,CHRISTOPHER  [facility] Pager [phone]. If 8PM-8AM, please contact night-coverage at [email or web address], password [facility] 06/18/2012, 12:22 PM  LOS: 3 days

## 2012-06-18 NOTE — Progress Notes (Signed)
Patient currently on 2 liters via nasal cannula pulse ox 93%.  Patient placed on 1 liter O2. Will continue to monitor.

## 2012-06-19 DIAGNOSIS — J449 Chronic obstructive pulmonary disease, unspecified: Secondary | ICD-10-CM

## 2012-06-19 LAB — BASIC METABOLIC PANEL
Calcium: 8.2 mg/dL — ABNORMAL LOW (ref 8.4–10.5)
Creatinine, Ser: 0.47 mg/dL — ABNORMAL LOW (ref 0.50–1.10)
GFR calc Af Amer: >90 mL/min (ref >90)
GFR calc non Af Amer: >90 mL/min (ref >90)
Sodium: 131 mEq/L — ABNORMAL LOW (ref 135–145)

## 2012-06-19 LAB — CBC
MCH: 32.5 pg (ref 26.0–34.0)
MCV: 96.1 fL (ref 78.0–100.0)
Platelets: 335 10*3/uL (ref 150–400)
RDW: 12.9 % (ref 11.5–15.5)

## 2012-06-19 MED ORDER — GUAIFENESIN ER 600 MG PO TB12: 600.0000 mg | ORAL_TABLET | Freq: Two times a day (BID) | ORAL | Status: DC

## 2012-06-19 MED ORDER — IPRATROPIUM-ALBUTEROL 18-103 MCG/ACT IN AERO: 2.0000 | INHALATION_SPRAY | Freq: Four times a day (QID) | RESPIRATORY_TRACT | Status: DC | PRN

## 2012-06-19 MED ORDER — LEVOFLOXACIN 500 MG PO TABS: 500.0000 mg | ORAL_TABLET | Freq: Every day | ORAL | Status: DC

## 2012-06-19 NOTE — Progress Notes (Signed)
Pt ambulated in hallway on RA. 02 sat 90%.  Pt tolerated ambulation well with slight SOB.

## 2012-06-19 NOTE — Progress Notes (Signed)
Patient discharged to home.  Patient's grandaughter at bedside.  Reviewed all discharge instructions and prescriptions with patient.  No further questions at this time.  IV removed from right wrist area.  Patient escorted to lobby via wheelchair.  Patient discharged.

## 2012-06-19 NOTE — Progress Notes (Signed)
Received a phone call from [facility] stating that the RX given for combivent inhaler is no longer being manufactured and that Dr. Brien Few needs to call the pharmacy to change the prescription.  Dr. Brien Few paged with the phone number to [facility] to change the RX.  Spoke to patient's grandaughter Shanda Bumps and told her that I would be paging Dr. Brien Few with this information.

## 2012-06-19 NOTE — Progress Notes (Signed)
Patient was awakened with a coughing episode in which she had a small amount of blood tinged sputum in a kleenex. She states that her nose had bled some earlier although not reported, but she had not had any blood present in her sputum prior. Nursing will continue to monitor.

## 2012-06-22 LAB — CULTURE, BLOOD (ROUTINE X 2)
Culture: NO GROWTH
Culture: NO GROWTH

## 2012-06-22 NOTE — Telephone Encounter (Signed)
Okay for one year  

## 2012-06-25 ENCOUNTER — Telehealth: Payer: Self-pay | Admitting: Family Medicine

## 2012-06-25 NOTE — Telephone Encounter (Signed)
Refill request for Clonazepam 0.5 mg take 1 po qhs and last here 06/15/12.

## 2012-06-25 NOTE — Telephone Encounter (Signed)
Call in #[phone] with one rf 

## 2012-06-26 ENCOUNTER — Ambulatory Visit: Payer: Medicare Other | Admitting: Family Medicine

## 2012-06-26 MED ORDER — CLONAZEPAM 0.5 MG PO TABS: 0.5000 mg | ORAL_TABLET | Freq: Every day | ORAL | Status: DC

## 2012-06-26 NOTE — Telephone Encounter (Signed)
Rx called in to pharmacy. 

## 2012-07-03 ENCOUNTER — Ambulatory Visit (INDEPENDENT_AMBULATORY_CARE_PROVIDER_SITE_OTHER): Payer: Medicare Other | Admitting: Family Medicine

## 2012-07-03 ENCOUNTER — Encounter: Payer: Self-pay | Admitting: Family Medicine

## 2012-07-03 VITALS — BP 132/86 | HR 101 | Temp 98.8°F | Wt 128.0 lb

## 2012-07-03 DIAGNOSIS — J40 Bronchitis, not specified as acute or chronic: Secondary | ICD-10-CM

## 2012-07-03 DIAGNOSIS — I10 Essential (primary) hypertension: Secondary | ICD-10-CM

## 2012-07-03 DIAGNOSIS — E871 Hypo-osmolality and hyponatremia: Secondary | ICD-10-CM

## 2012-07-03 MED ORDER — CLONAZEPAM 0.5 MG PO TABS: 1.0000 mg | ORAL_TABLET | Freq: Every day | ORAL | Status: DC

## 2012-07-03 NOTE — Progress Notes (Signed)
  Subjective:    Patient ID: Jill White, female    DOB: 03-07-1941, 72 y.o.   MRN: [id number]  HPI Here to follow up on a hospital stay from 06-15-12 to 06-19-12 for bronchitis on top of a viral illness. She had fever, vomiting, and coughing. She was given IV fluids and Levaquin, and she recovered well. Her sodium was quite low during this time, as low as 128, and this was corrected with IV fluids and with oral fluid restriction. Since getting home she has stopped the fluid restriction and she feels fine. Her energy level is still a little low but she has no SOB or cough or fever. She does have trouble sleeping however.  Review of Systems  Constitutional: Negative.   HENT: Negative.   Eyes: Negative.   Respiratory: Negative.   Gastrointestinal: Negative.   Genitourinary: Negative.        Objective:   Physical Exam  Constitutional: She appears well-developed and well-nourished. No distress.  HENT:  Right Ear: External ear normal.  Left Ear: External ear normal.  Nose: Nose normal.  Mouth/Throat: Oropharynx is clear and moist.  Eyes: Conjunctivae normal are normal.  Cardiovascular: Normal rate, regular rhythm, normal heart sounds and intact distal pulses.   Pulmonary/Chest: Effort normal and breath sounds normal.  Lymphadenopathy:    She has no cervical adenopathy.          Assessment & Plan:  She has recovered from the bronchitis. Check a BMET today

## 2012-07-04 LAB — BASIC METABOLIC PANEL
Calcium: 9.5 mg/dL (ref 8.4–10.5)
GFR: 69.56 mL/min (ref >60.00)
Glucose, Bld: 100 mg/dL — ABNORMAL HIGH (ref 70–99)
Potassium: 4.9 mEq/L (ref 3.5–5.1)
Sodium: 134 mEq/L — ABNORMAL LOW (ref 135–145)

## 2012-07-05 NOTE — Progress Notes (Signed)
Quick Note:  I released results to My chart ______ 

## 2012-07-06 ENCOUNTER — Telehealth: Payer: Self-pay | Admitting: Family Medicine

## 2012-07-06 NOTE — Telephone Encounter (Signed)
Pt would like lab results from Tues 1/14. Please call.

## 2012-07-09 NOTE — Telephone Encounter (Signed)
I spoke with pt and gave results.  

## 2012-07-19 ENCOUNTER — Other Ambulatory Visit: Payer: Self-pay | Admitting: Family Medicine

## 2012-07-20 NOTE — Telephone Encounter (Signed)
Can we refill this? 

## 2012-07-24 NOTE — Telephone Encounter (Signed)
Refill for one year 

## 2013-02-11 ENCOUNTER — Telehealth: Payer: Self-pay

## 2013-02-11 NOTE — Telephone Encounter (Signed)
Last OV and refill date 07/03/12 for #60 with 5 refills

## 2013-02-12 MED ORDER — CLONAZEPAM 0.5 MG PO TABS: 1.0000 mg | ORAL_TABLET | Freq: Every day | ORAL | Status: DC

## 2013-02-12 NOTE — Telephone Encounter (Signed)
I assume this is for clonazepam. Call in #60 with 5 rf

## 2013-02-12 NOTE — Telephone Encounter (Signed)
Phoned in.

## 2013-02-22 ENCOUNTER — Other Ambulatory Visit: Payer: Self-pay | Admitting: Family Medicine

## 2013-03-20 HISTORY — PX: CATARACT EXTRACTION W/ INTRAOCULAR LENS  IMPLANT, BILATERAL: SHX1307

## 2013-03-23 ENCOUNTER — Other Ambulatory Visit: Payer: Self-pay | Admitting: Family Medicine

## 2013-04-26 ENCOUNTER — Ambulatory Visit: Payer: Medicare Other | Admitting: Family

## 2013-04-29 ENCOUNTER — Emergency Department (HOSPITAL_COMMUNITY): Payer: Medicare Other

## 2013-04-29 ENCOUNTER — Encounter (HOSPITAL_COMMUNITY): Payer: Self-pay | Admitting: Emergency Medicine

## 2013-04-29 ENCOUNTER — Encounter: Payer: Self-pay | Admitting: Family Medicine

## 2013-04-29 ENCOUNTER — Inpatient Hospital Stay (HOSPITAL_COMMUNITY)
Admission: EM | Admit: 2013-04-29 | Discharge: 2013-05-03 | DRG: 193 | Disposition: A | Discharge status: Home / Self Care | Payer: Medicare Other | Attending: Internal Medicine | Admitting: Internal Medicine

## 2013-04-29 ENCOUNTER — Ambulatory Visit (INDEPENDENT_AMBULATORY_CARE_PROVIDER_SITE_OTHER): Payer: Medicare Other | Admitting: Family Medicine

## 2013-04-29 VITALS — BP 100/60 | HR 106 | Temp 98.5°F | Wt 128.0 lb

## 2013-04-29 DIAGNOSIS — G2581 Restless legs syndrome: Secondary | ICD-10-CM | POA: Diagnosis present

## 2013-04-29 DIAGNOSIS — I10 Essential (primary) hypertension: Secondary | ICD-10-CM | POA: Diagnosis present

## 2013-04-29 DIAGNOSIS — J449 Chronic obstructive pulmonary disease, unspecified: Secondary | ICD-10-CM

## 2013-04-29 DIAGNOSIS — F17200 Nicotine dependence, unspecified, uncomplicated: Secondary | ICD-10-CM | POA: Diagnosis present

## 2013-04-29 DIAGNOSIS — Z7982 Long term (current) use of aspirin: Secondary | ICD-10-CM

## 2013-04-29 DIAGNOSIS — J441 Chronic obstructive pulmonary disease with (acute) exacerbation: Secondary | ICD-10-CM | POA: Diagnosis present

## 2013-04-29 DIAGNOSIS — Z8711 Personal history of peptic ulcer disease: Secondary | ICD-10-CM

## 2013-04-29 DIAGNOSIS — I447 Left bundle-branch block, unspecified: Secondary | ICD-10-CM | POA: Diagnosis present

## 2013-04-29 DIAGNOSIS — J189 Pneumonia, unspecified organism: Secondary | ICD-10-CM | POA: Diagnosis present

## 2013-04-29 DIAGNOSIS — R0603 Acute respiratory distress: Secondary | ICD-10-CM | POA: Diagnosis present

## 2013-04-29 DIAGNOSIS — Z720 Tobacco use: Secondary | ICD-10-CM

## 2013-04-29 DIAGNOSIS — F17210 Nicotine dependence, cigarettes, uncomplicated: Secondary | ICD-10-CM | POA: Diagnosis present

## 2013-04-29 DIAGNOSIS — Z8249 Family history of ischemic heart disease and other diseases of the circulatory system: Secondary | ICD-10-CM

## 2013-04-29 DIAGNOSIS — J9600 Acute respiratory failure, unspecified whether with hypoxia or hypercapnia: Secondary | ICD-10-CM | POA: Diagnosis present

## 2013-04-29 DIAGNOSIS — R062 Wheezing: Secondary | ICD-10-CM | POA: Diagnosis present

## 2013-04-29 DIAGNOSIS — E871 Hypo-osmolality and hyponatremia: Secondary | ICD-10-CM | POA: Diagnosis present

## 2013-04-29 LAB — BASIC METABOLIC PANEL
BUN: 4 mg/dL — ABNORMAL LOW (ref 6–23)
Calcium: 9.1 mg/dL (ref 8.4–10.5)
GFR calc Af Amer: >90 mL/min (ref >90)
GFR calc non Af Amer: >90 mL/min (ref >90)
Glucose, Bld: 107 mg/dL — ABNORMAL HIGH (ref 70–99)
Sodium: 126 mEq/L — ABNORMAL LOW (ref 135–145)

## 2013-04-29 LAB — CBC WITH DIFFERENTIAL/PLATELET
Eosinophils Absolute: 0 10*3/uL (ref 0.0–0.7)
Eosinophils Relative: 1 % (ref 0–5)
HCT: 40.5 % (ref 36.0–46.0)
Lymphs Abs: 1.7 10*3/uL (ref 0.7–4.0)
MCH: 33.2 pg (ref 26.0–34.0)
MCV: 97.4 fL (ref 78.0–100.0)
Platelets: 345 10*3/uL (ref 150–400)
RBC: 4.16 MIL/uL (ref 3.87–5.11)
WBC: 7.5 10*3/uL (ref 4.0–10.5)

## 2013-04-29 LAB — URINALYSIS, ROUTINE W REFLEX MICROSCOPIC
Bilirubin Urine: NEGATIVE
Glucose, UA: NEGATIVE mg/dL
Hgb urine dipstick: NEGATIVE
Ketones, ur: NEGATIVE mg/dL
Leukocytes, UA: NEGATIVE
Protein, ur: NEGATIVE mg/dL
Urobilinogen, UA: 0.2 mg/dL (ref 0.0–1.0)

## 2013-04-29 LAB — EXPECTORATED SPUTUM ASSESSMENT W GRAM STAIN, RFLX TO RESP C

## 2013-04-29 LAB — OSMOLALITY, URINE: Osmolality, Ur: 230 mOsm/kg — ABNORMAL LOW (ref 390–1090)

## 2013-04-29 LAB — BLOOD GAS, ARTERIAL
Acid-Base Excess: 6.1 mmol/L — ABNORMAL HIGH (ref 0.0–2.0)
Bicarbonate: 30.7 mEq/L — ABNORMAL HIGH (ref 20.0–24.0)
Drawn by: 313941
O2 Content: 2 L/min
O2 Saturation: 89.7 %
Patient temperature: 98.6
TCO2: 26.8 mmol/L (ref 0–100)
pCO2 arterial: 45.5 mmHg — ABNORMAL HIGH (ref 35.0–45.0)

## 2013-04-29 LAB — TROPONIN I: Troponin I: <0.3 ng/mL (ref <0.30)

## 2013-04-29 LAB — PRO B NATRIURETIC PEPTIDE: Pro B Natriuretic peptide (BNP): 518.4 pg/mL — ABNORMAL HIGH (ref 0–125)

## 2013-04-29 LAB — SODIUM, URINE, RANDOM: Sodium, Ur: 43 mEq/L

## 2013-04-29 LAB — MAGNESIUM: Magnesium: 1.4 mg/dL — ABNORMAL LOW (ref 1.5–2.5)

## 2013-04-29 MED ORDER — LEVOFLOXACIN IN D5W 750 MG/150ML IV SOLN
750.0000 mg | INTRAVENOUS | Status: DC
  Administered 2013-04-30 – 2013-05-01 (×2): 750 mg via INTRAVENOUS
  Filled 2013-04-29 (×2): qty 150

## 2013-04-29 MED ORDER — LOSARTAN POTASSIUM 50 MG PO TABS
50.0000 mg | ORAL_TABLET | Freq: Every day | ORAL | Status: DC
  Administered 2013-04-30: 50 mg via ORAL
  Filled 2013-04-29: qty 1

## 2013-04-29 MED ORDER — SODIUM CHLORIDE 0.9 % IV SOLN
INTRAVENOUS | Status: DC
  Administered 2013-04-29 – 2013-04-30 (×3): via INTRAVENOUS

## 2013-04-29 MED ORDER — LEVOFLOXACIN IN D5W 750 MG/150ML IV SOLN
750.0000 mg | INTRAVENOUS | Status: DC
  Filled 2013-04-29: qty 150

## 2013-04-29 MED ORDER — PANTOPRAZOLE SODIUM 40 MG IV SOLR
40.0000 mg | Freq: Once | INTRAVENOUS | Status: AC
  Administered 2013-04-29: 40 mg via INTRAVENOUS
  Filled 2013-04-29: qty 40

## 2013-04-29 MED ORDER — DEXTROSE 5 % IV SOLN: 1.0000 g | Freq: Once | INTRAVENOUS | Status: DC

## 2013-04-29 MED ORDER — GUAIFENESIN ER 600 MG PO TB12
1200.0000 mg | ORAL_TABLET | Freq: Two times a day (BID) | ORAL | Status: DC
  Administered 2013-04-29 – 2013-04-30 (×3): 1200 mg via ORAL
  Filled 2013-04-29 (×5): qty 2

## 2013-04-29 MED ORDER — PANTOPRAZOLE SODIUM 40 MG PO TBEC
40.0000 mg | DELAYED_RELEASE_TABLET | Freq: Two times a day (BID) | ORAL | Status: DC
  Administered 2013-04-30 – 2013-05-03 (×7): 40 mg via ORAL
  Filled 2013-04-29 (×10): qty 1

## 2013-04-29 MED ORDER — ONDANSETRON HCL 4 MG/2ML IJ SOLN: 4.0000 mg | Freq: Four times a day (QID) | INTRAMUSCULAR | Status: DC | PRN

## 2013-04-29 MED ORDER — AMLODIPINE BESYLATE 5 MG PO TABS
5.0000 mg | ORAL_TABLET | Freq: Every day | ORAL | Status: DC
  Administered 2013-04-30 – 2013-05-03 (×4): 5 mg via ORAL
  Filled 2013-04-29 (×4): qty 1

## 2013-04-29 MED ORDER — METHYLPREDNISOLONE SODIUM SUCC 125 MG IJ SOLR
80.0000 mg | Freq: Three times a day (TID) | INTRAMUSCULAR | Status: DC
  Filled 2013-04-29 (×3): qty 1.28

## 2013-04-29 MED ORDER — METHYLPREDNISOLONE SODIUM SUCC 125 MG IJ SOLR
80.0000 mg | Freq: Three times a day (TID) | INTRAMUSCULAR | Status: DC
  Administered 2013-04-29 – 2013-04-30 (×3): 80 mg via INTRAVENOUS
  Filled 2013-04-29 (×5): qty 1.28

## 2013-04-29 MED ORDER — FUROSEMIDE 10 MG/ML IJ SOLN: 40.0000 mg | Freq: Once | INTRAMUSCULAR | Status: DC

## 2013-04-29 MED ORDER — CLONAZEPAM 1 MG PO TABS
1.0000 mg | ORAL_TABLET | Freq: Every day | ORAL | Status: DC
  Administered 2013-04-29 – 2013-05-02 (×4): 1 mg via ORAL
  Filled 2013-04-29 (×4): qty 1

## 2013-04-29 MED ORDER — ALBUTEROL SULFATE (5 MG/ML) 0.5% IN NEBU: 2.5000 mg | INHALATION_SOLUTION | RESPIRATORY_TRACT | Status: DC | PRN

## 2013-04-29 MED ORDER — ACETAMINOPHEN 325 MG PO TABS: 650.0000 mg | ORAL_TABLET | ORAL | Status: DC | PRN

## 2013-04-29 MED ORDER — NICOTINE 21 MG/24HR TD PT24
21.0000 mg | MEDICATED_PATCH | Freq: Every day | TRANSDERMAL | Status: DC
  Administered 2013-04-29 – 2013-05-03 (×5): 21 mg via TRANSDERMAL
  Filled 2013-04-29 (×5): qty 1

## 2013-04-29 MED ORDER — BENZONATATE 100 MG PO CAPS
200.0000 mg | ORAL_CAPSULE | Freq: Three times a day (TID) | ORAL | Status: DC | PRN
  Administered 2013-04-30 – 2013-05-02 (×7): 200 mg via ORAL
  Filled 2013-04-29 (×6): qty 2

## 2013-04-29 MED ORDER — ASPIRIN 81 MG PO TABS
81.0000 mg | ORAL_TABLET | Freq: Every day | ORAL | Status: DC
  Administered 2013-04-30 – 2013-05-01 (×2): 81 mg via ORAL
  Filled 2013-04-29 (×3): qty 1

## 2013-04-29 MED ORDER — LEVOFLOXACIN IN D5W 750 MG/150ML IV SOLN
750.0000 mg | Freq: Once | INTRAVENOUS | Status: AC
  Administered 2013-04-29: 750 mg via INTRAVENOUS
  Filled 2013-04-29: qty 150

## 2013-04-29 MED ORDER — IPRATROPIUM BROMIDE 0.02 % IN SOLN: 0.5000 mg | RESPIRATORY_TRACT | Status: DC | PRN

## 2013-04-29 MED ORDER — METHYLPREDNISOLONE SODIUM SUCC 125 MG IJ SOLR
125.0000 mg | Freq: Once | INTRAMUSCULAR | Status: AC
  Administered 2013-04-29: 125 mg via INTRAVENOUS
  Filled 2013-04-29: qty 2

## 2013-04-29 MED ORDER — DEXTROSE 5 % IV SOLN: 500.0000 mg | Freq: Once | INTRAVENOUS | Status: DC

## 2013-04-29 MED ORDER — ALBUTEROL SULFATE (5 MG/ML) 0.5% IN NEBU
2.5000 mg | INHALATION_SOLUTION | Freq: Four times a day (QID) | RESPIRATORY_TRACT | Status: DC
  Administered 2013-04-29 – 2013-05-03 (×14): 2.5 mg via RESPIRATORY_TRACT
  Filled 2013-04-29 (×16): qty 0.5

## 2013-04-29 MED ORDER — ENOXAPARIN SODIUM 40 MG/0.4ML ~~LOC~~ SOLN
40.0000 mg | SUBCUTANEOUS | Status: DC
  Administered 2013-04-29 – 2013-05-02 (×4): 40 mg via SUBCUTANEOUS
  Filled 2013-04-29 (×5): qty 0.4

## 2013-04-29 MED ORDER — IPRATROPIUM-ALBUTEROL 0.5-2.5 (3) MG/3ML IN SOLN
3.0000 mL | Freq: Once | RESPIRATORY_TRACT | Status: AC
  Administered 2013-04-29: 3 mL via RESPIRATORY_TRACT

## 2013-04-29 MED ORDER — IPRATROPIUM BROMIDE 0.02 % IN SOLN
0.5000 mg | Freq: Four times a day (QID) | RESPIRATORY_TRACT | Status: DC
  Administered 2013-04-29 – 2013-05-03 (×14): 0.5 mg via RESPIRATORY_TRACT
  Filled 2013-04-29 (×16): qty 2.5

## 2013-04-29 NOTE — Progress Notes (Signed)
Pre visit review using our clinic review tool, if applicable. No additional management support is needed unless otherwise documented below in the visit note. 

## 2013-04-29 NOTE — Progress Notes (Signed)
  Subjective:    Patient ID: Jill White, female    DOB: [birth year], 72 y.o.   MRN: [id number]  HPI Here for 3 days of chest tightness, coughing up green sputum, and SOB. She has COPD and uses a Combivent inhaler. No fevers. The last 24 hours have been very difficult for her.    Review of Systems  Constitutional: Negative for fever, chills and diaphoresis.  HENT: Negative.   Eyes: Negative.   Respiratory: Positive for cough, chest tightness, shortness of breath and wheezing.   Cardiovascular: Negative.        Objective:   Physical Exam  Constitutional:  In mild respiratory distress. Exhaling through pursed lips. Audible wheezes. Frequent coughing.   HENT:  Right Ear: External ear normal.  Nose: Nose normal.  Mouth/Throat: Oropharynx is clear and moist.  Eyes: Conjunctivae are normal.  Pulmonary/Chest:  Diffuse rhonchi and wheezes, there are rales present in the right lower lobe   Lymphadenopathy:    She has no cervical adenopathy.          Assessment & Plan:  She was given a nebulizer treatment with Duoneb but she had very little response. She has COPD and a probable pneumonia. She will drive herself from here directly to [facility] for further evaluation.

## 2013-04-29 NOTE — H&P (Signed)
[facility] History and Physical  BEOLA VASALLO ZOX:[id number] DOB: 1940/11/08 DOA: 04/29/2013  Referring physician: Dr. Fayrene Fearing PCP: Nelwyn Salisbury, MD  Specialists:  Chief Complaint: Cough and worsening shortness of breath  HPI: Jill White is a 72 y.o. female  With past medical history of hypertension, COPD with continued tobacco abuse, history of peptic ulcer disease, hypertension who presents to the ED with a proximally a week history of productive cough of greenish yellowish sputum with some bloody streaks, fever with temperatures has on a 1.8, wheezing, chills, diaphoresis, chest soreness secondary to coughing, shortness of breath, nausea. Patient denies any emesis, no abdominal pain, no constipation, no diarrhea, no weakness, no dysuria. Patient denies any recent weight gain. Patient states she has been taking her brother back in 4 to [facility] [place] over the past month. Patient to PCP on the day of admission was given some duo nebs in the office however had minimal response patient was subsequently sent to the ED. In the emergency room chest x-ray which was done was worrisome for right lower lobe pneumonia some pulmonary interstitial edema with some superimposed pneumonitis or underlying pulmonary interstitial fibrosis noted. ABG had a pH of 7.44 PCO2 45 PO2 54 bicarbonate of 31 basic metabolic profile with a sodium of 126 chloride of 88 BUN of 4 creatinine of 0.39 with a glucose of 107. Versed of troponins was negative. Pro BNP was 518.4. CBC was unremarkable. Urinalysis is pending. Patient was given some nebulizer treatments in the ED as well as a dose of IV Levaquin and we were consulted to admit the patient for further evaluation and management.  Review of Systems: The patient denies anorexia, fever, weight loss,, vision loss, decreased hearing, hoarseness, chest pain, syncope, dyspnea on exertion, peripheral edema, balance deficits, hemoptysis, abdominal pain, melena,  hematochezia, severe indigestion/heartburn, hematuria, incontinence, genital sores, muscle weakness, suspicious skin lesions, transient blindness, difficulty walking, depression, unusual weight change, abnormal bleeding, enlarged lymph nodes, angioedema, and breast masses.   Past Medical History  Diagnosis Date  . HIP PAIN 03/06/2007  . HYPERTENSION 02/09/2007  . Restless leg syndrome   . COPD (chronic obstructive pulmonary disease)   . Complication of anesthesia   . Ulcer     gastric antrum    Past Surgical History  Procedure Laterality Date  . Abdominal hysterectomy    . Dilation and curettage of uterus    . Tonsillectomy    . Esophagogastroduodenoscopy  07/09/2011    Procedure: ESOPHAGOGASTRODUODENOSCOPY (EGD);  Surgeon: Barrie Folk, MD;  Location: [facility];  Service: Endoscopy;  Laterality: N/A;  patient in room 1532  . Cataract extraction w/ intraocular lens  implant, bilateral Bilateral 03/2013    Patient claims it was within the month.    Social History:  reports that she has been smoking Cigarettes.  She has been smoking about 0.50 packs per day. She has never used smokeless tobacco. She reports that she drinks about 0.5 ounces of alcohol per week. She reports that she does not use illicit drugs.   Allergies  Allergen Reactions  . Tetanus Toxoid Anaphylaxis  . Codeine Nausea And Vomiting and Other (See Comments)    Itching and faint   . Erythromycin Nausea And Vomiting  . Meloxicam Itching  . Penicillins Hives  . Prednisone Other (See Comments)    GI bleed    Family History  Problem Relation Age of Onset  . Heart disease Sister   . Heart disease Brother   . Dementia Mother   .  Anemia Father      Prior to Admission medications   Medication Sig Start Date End Date Taking? Authorizing Provider  albuterol-ipratropium (COMBIVENT) 18-103 MCG/ACT inhaler Inhale 2 puffs into the lungs every 6 (six) hours as needed for wheezing. 06/19/12  Yes Laveda Norman, MD   amLODipine (NORVASC) 5 MG tablet Take one tablet by mouth one time daily 02/22/13  Yes Nelwyn Salisbury, MD  aspirin 81 MG tablet Take 81 mg by mouth daily.     Yes Historical Provider, MD  calcium-vitamin D (OSCAL WITH D) 500-200 MG-UNIT per tablet Take 1 tablet by mouth daily.   Yes Historical Provider, MD  clonazePAM (KLONOPIN) 0.5 MG tablet Take 2 tablets (1 mg total) by mouth at bedtime. 02/12/13  Yes Nelwyn Salisbury, MD  furosemide (LASIX) 20 MG tablet Take 20 mg by mouth daily as needed for fluid.   Yes Historical Provider, MD  guaiFENesin (MUCINEX) 600 MG 12 hr tablet Take 1 tablet (600 mg total) by mouth 2 (two) times daily. 06/19/12  Yes Laveda Norman, MD  losartan (COZAAR) 50 MG tablet Take 50 mg by mouth daily.   Yes Historical Provider, MD  ondansetron (ZOFRAN) 4 MG tablet Take one tablet by mouth every four hours as needed  for nausea 03/23/13  Yes Nelwyn Salisbury, MD  vitamin E 100 UNIT capsule Take 100 Units by mouth daily.   Yes Historical Provider, MD   Physical Exam: Filed Vitals:   04/29/13 1644  BP:   Pulse: 87  Temp:   Resp: 25     General:  Frail elderly female with some use of accessory muscles of respiration however speaking in full sentences laying on a gurney.  Eyes: Pupils carina reactive to light and accommodation. Extraocular movements intact.  ENT: Oropharynx is clear, no lesions, no exudates.  Neck: Supple with lymphadenopathy.  Cardiovascular: Regular rate rhythm no murmurs rubs or gallops.  Respiratory: Diffuse coarse rhonchorous breath sounds more on the right base. Positive expiratory wheezing.  Abdomen: Soft, nontender, nondistended, positive bowel sounds.  Skin: No rashes or lesions.  Musculoskeletal: 4/5 BUE strength,4/5 BLE distress.  Psychiatric: Normal mood. Normal affect. Fair insight fair judgment.  Neurologic: Alert and oriented x3. Cranial nerves II through XII are grossly intact. No focal deficits.  Labs on Admission:  Basic Metabolic  Panel:  Recent Labs Lab 04/29/13 1245  NA 126*  K 4.2  CL 88*  CO2 27  GLUCOSE 107*  BUN 4*  CREATININE 0.39*  CALCIUM 9.1   Liver Function Tests: No results found for this basename: AST, ALT, ALKPHOS, BILITOT, PROT, ALBUMIN,  in the last 168 hours No results found for this basename: LIPASE, AMYLASE,  in the last 168 hours No results found for this basename: AMMONIA,  in the last 168 hours CBC:  Recent Labs Lab 04/29/13 1245  WBC 7.5  NEUTROABS 4.7  HGB 13.8  HCT 40.5  MCV 97.4  PLT 345   Cardiac Enzymes:  Recent Labs Lab 04/29/13 1245  TROPONINI <0.30    BNP (last 3 results)  Recent Labs  04/29/13 1228  PROBNP 518.4*   CBG: No results found for this basename: GLUCAP,  in the last 168 hours  Radiological Exams on Admission: Dg Chest Port 1 View  04/29/2013   CLINICAL DATA:  Cough and congestion.  EXAM: PORTABLE CHEST - 1 VIEW  COMPARISON:  06/15/2012 .  FINDINGS: Mediastinum and hilar structures are normal. Cardiomegaly with pulmonary vascular prominence and interstitial prominence  noted suggesting CHF with pulmonary interstitial edema. Interstitial disease may also be related to active interstitial pneumonitis or interstitial fibrosis.  Ill-defined infiltrate right lower lobe is present. This may represent pulmonary edema or pneumonia. Mild pleural effusions noted bilaterally . No pneumothorax. Degenerative changes both shoulders and thoracic spine.  IMPRESSION: 1. Findings suggesting congestive heart failure with pulmonary interstitial edema. Superimposed pneumonitis or underlying pulmonary interstitial fibrosis may be present. 2. Right lower lobe infiltrate noted suggesting pneumonia. Small pleural effusions cannot be excluded.   Electronically Signed   By: Maisie Fus  Register   On: 04/29/2013 12:21    EKG: Independently reviewed. Left atrial enlargement. Left bundle branch block.  Assessment/Plan Principal Problem:   Acute respiratory distress Active  Problems:   HYPERTENSION   Hyponatremia   RLS (restless legs syndrome)   Tobacco abuse   Dehydration   COPD exacerbation   CAP (community acquired pneumonia)  #1 acute respiratory distress Likely multifactorial secondary to community-acquired pneumonia and probable early COPD exacerbation. Patient looks clinically dry. We'll admit patient to telemetry. Check blood cultures x2. Check a sputum Gram stain and culture. Check a urine Legionella and urine pneumococcus antigen. Placed empirically on IV Levaquin, IV steroids, oxygen, scheduled nebulizer treatments, Mucinex. Follow.  #2 community acquired pneumonia Will check a sputum Gram stain and culture. Check a urine pneumococcus antigen. Check a urine Legionella antigen. Placed on IV Levaquin, oxygen, Mucinex, scheduled nebulizers.  #3 probable early COPD Patient with some wheezing on examination. Will place patient empirically on IV steroids, IV Levaquin, nebulizer treatments, oxygen. Tobacco cessation stressed the patient.  #4 dehydration Gentle hydration.  #5 tobacco abuse Tobacco cessation. We'll place on a nicotine patch.  #6 hyponatremia Likely secondary to a hypovolemic hyponatremia. Patient looks clinically dry on examination. Will check a urine sodium. Check a urine creatinine. Check a urine osmolality. Check a serum osmolality. Will hydrate gently with IV fluids and follow.  #7 left bundle branch block Seems new compared to prior EKG. repeat EKG. Will cycle cardiac enzymes every 6 hours x3. Will check a 2-D echo. Will consult cardiology for further evaluation and management.  #8 prophylaxis PPI for GI prophylaxis. Lovenox for DVT prophylaxis.   Code Status: Full Family Communication: Updated patient and daughter at bedside. Disposition Plan: Admit to telemetry.  Time spent: 70 mins  [facility] Pager [phone]  If 7PM-7AM, please contact night-coverage [email or web address] Password [facility] 04/29/2013,  4:46 PM

## 2013-04-29 NOTE — ED Notes (Signed)
MD at bedside. 

## 2013-04-29 NOTE — ED Notes (Signed)
Patient came in complaining with sore throat and congestion in the head that started about a week ago. Patient stated symptoms progressed to fever and congestion in the chest the next day.

## 2013-04-29 NOTE — Consult Note (Addendum)
Primary Physician:  Clent Ridges Primary Cardiologist:  new   HPI: Patient is a 72 yo who we are asked to see re LBBB The patient has a history of COPD with continued tob use, PUD, HTN.  Presented today with cough (prod of green/yellow spututm), fever, chills chest pain with cough.   Note EKG from May 2012 showed LBBB The patient says she has been under increased stress the past month caring for brother who has been in /out of hospital.  Has spent signif time in ER/hospital Daughter says she has had more DOE over the past couple months.   Besided bilateral pleuritic CP she denies CP  No dizziness No syncope.         Past Medical History  Diagnosis Date  . HIP PAIN 03/06/2007  . HYPERTENSION 02/09/2007  . Restless leg syndrome   . COPD (chronic obstructive pulmonary disease)   . Complication of anesthesia   . Ulcer     gastric antrum     Medications Prior to Admission  Medication Sig Dispense Refill  . albuterol-ipratropium (COMBIVENT) 18-103 MCG/ACT inhaler Inhale 2 puffs into the lungs every 6 (six) hours as needed for wheezing.  1 Inhaler  1  . amLODipine (NORVASC) 5 MG tablet Take one tablet by mouth one time daily  90 tablet  0  . aspirin 81 MG tablet Take 81 mg by mouth daily.        . calcium-vitamin D (OSCAL WITH D) 500-200 MG-UNIT per tablet Take 1 tablet by mouth daily.      . clonazePAM (KLONOPIN) 0.5 MG tablet Take 2 tablets (1 mg total) by mouth at bedtime.  60 tablet  5  . furosemide (LASIX) 20 MG tablet Take 20 mg by mouth daily as needed for fluid.      Marland Kitchen guaiFENesin (MUCINEX) 600 MG 12 hr tablet Take 1 tablet (600 mg total) by mouth 2 (two) times daily.  14 tablet  0  . losartan (COZAAR) 50 MG tablet Take 50 mg by mouth daily.      . ondansetron (ZOFRAN) 4 MG tablet Take one tablet by mouth every four hours as needed  for nausea  60 tablet  4  . vitamin E 100 UNIT capsule Take 100 Units by mouth daily.         Marland Kitchen albuterol  2.5 mg Nebulization Q6H  . amLODipine  5  mg Oral Daily  . aspirin  81 mg Oral Daily  . clonazePAM  1 mg Oral QHS  . enoxaparin (LOVENOX) injection  40 mg Subcutaneous Q24H  . guaiFENesin  1,200 mg Oral BID  . ipratropium  0.5 mg Nebulization Q6H  . levofloxacin (LEVAQUIN) IV  750 mg Intravenous Q24H  . losartan  50 mg Oral Daily  . methylPREDNISolone (SOLU-MEDROL) injection  80 mg Intravenous Q8H  . nicotine  21 mg Transdermal Daily  . [START ON 04/30/2013] pantoprazole  40 mg Oral BID AC    Infusions: . sodium chloride 75 mL/hr at 04/29/13 1718    Allergies  Allergen Reactions  . Tetanus Toxoid Anaphylaxis  . Codeine Nausea And Vomiting and Other (See Comments)    Itching and faint   . Erythromycin Nausea And Vomiting  . Meloxicam Itching  . Penicillins Hives  . Prednisone Other (See Comments)    GI bleed    History   Social History  . Marital Status: Widowed    Spouse Name: N/A    Number of Children: N/A  .  Years of Education: N/A   Occupational History  . Not on file.   Social History Main Topics  . Smoking status: Current Every Day Smoker -- 0.50 packs/day    Types: Cigarettes  . Smokeless tobacco: Never Used  . Alcohol Use: 0.5 oz/week    1 drink(s) per week  . Drug Use: No  . Sexual Activity: No   Other Topics Concern  . Not on file   Social History Narrative  . No narrative on file    Family History  Problem Relation Age of Onset  . Heart disease Sister   . Heart disease Brother   . Dementia Mother   . Anemia Father     REVIEW OF SYSTEMS:  All systems reviewed  Negative to the above problem except as noted above.    PHYSICAL EXAM: Filed Vitals:   04/29/13 1721  BP: 133/111  Pulse: 117  Temp: 97.5 F (36.4 C)  Resp: 22   Previous BP 131/81  P80s to 110s   No intake or output data in the 24 hours ending 04/29/13 1727  General:  Well appearing. No respiratory difficulty HEENT: normal Neck: supple.JVP 8 cm Carotids 2+ bilat; no bruits. No lymphadenopathy or thryomegaly  appreciated. Cor: PMI nondisplaced. Regular rate & rhythm. Gr III/VIsystolic murmur best heard at LLSB  Lungs: Decreased BS at R base  Rales at L base   Abdomen: soft, nontender, nondistended. No hepatosplenomegaly. No bruits or masses. Good bowel sounds. Extremities: no cyanosis, clubbing, rash, edema Neuro: alert & oriented x 3, cranial nerves grossly intact. moves all 4 extremities w/o difficulty. Affect pleasant.  ECG:  SR 91  LBBB  Results for orders placed during the hospital encounter of 04/29/13 (from the past 24 hour(s))  PRO B NATRIURETIC PEPTIDE     Status: Abnormal   Collection Time    04/29/13 12:28 PM      Result Value Range   Pro B Natriuretic peptide (BNP) 518.4 (*) 0 - 125 pg/mL  CBC WITH DIFFERENTIAL     Status: Abnormal   Collection Time    04/29/13 12:45 PM      Result Value Range   WBC 7.5  4.0 - 10.5 K/uL   RBC 4.16  3.87 - 5.11 MIL/uL   Hemoglobin 13.8  12.0 - 15.0 g/dL   HCT 16.1  09.6 - 04.5 %   MCV 97.4  78.0 - 100.0 fL   MCH 33.2  26.0 - 34.0 pg   MCHC 34.1  30.0 - 36.0 g/dL   RDW 40.9  81.1 - 91.4 %   Platelets 345  150 - 400 K/uL   Neutrophils Relative % 63  43 - 77 %   Neutro Abs 4.7  1.7 - 7.7 K/uL   Lymphocytes Relative 23  12 - 46 %   Lymphs Abs 1.7  0.7 - 4.0 K/uL   Monocytes Relative 13 (*) 3 - 12 %   Monocytes Absolute 1.0  0.1 - 1.0 K/uL   Eosinophils Relative 1  0 - 5 %   Eosinophils Absolute 0.0  0.0 - 0.7 K/uL   Basophils Relative 0  0 - 1 %   Basophils Absolute 0.0  0.0 - 0.1 K/uL  BASIC METABOLIC PANEL     Status: Abnormal   Collection Time    04/29/13 12:45 PM      Result Value Range   Sodium 126 (*) 135 - 145 mEq/L   Potassium 4.2  3.5 - 5.1 mEq/L  Chloride 88 (*) 96 - 112 mEq/L   CO2 27  19 - 32 mEq/L   Glucose, Bld 107 (*) 70 - 99 mg/dL   BUN 4 (*) 6 - 23 mg/dL   Creatinine, Ser 4.09 (*) 0.50 - 1.10 mg/dL   Calcium 9.1  8.4 - 81.1 mg/dL   GFR calc non Af Amer >90  >90 mL/min   GFR calc Af Amer >90  >90 mL/min  TROPONIN  I     Status: None   Collection Time    04/29/13 12:45 PM      Result Value Range   Troponin I <0.30  <0.30 ng/mL  BLOOD GAS, ARTERIAL     Status: Abnormal   Collection Time    04/29/13 12:57 PM      Result Value Range   O2 Content 2.0     Delivery systems NASAL CANNULA     pH, Arterial 7.444  7.350 - 7.450   pCO2 arterial 45.5 (*) 35.0 - 45.0 mmHg   pO2, Arterial 54.0 (*) 80.0 - 100.0 mmHg   Bicarbonate 30.7 (*) 20.0 - 24.0 mEq/L   TCO2 26.8  0 - 100 mmol/L   Acid-Base Excess 6.1 (*) 0.0 - 2.0 mmol/L   O2 Saturation 89.7     Patient temperature 98.6     Collection site LEFT RADIAL     Drawn by [id number]     Sample type ARTERIAL DRAW     Allens test (pass/fail) PASS  PASS  URINALYSIS, ROUTINE W REFLEX MICROSCOPIC     Status: None   Collection Time    04/29/13  3:31 PM      Result Value Range   Color, Urine YELLOW  YELLOW   APPearance CLEAR  CLEAR   Specific Gravity, Urine 1.013  1.005 - 1.030   pH 8.0  5.0 - 8.0   Glucose, UA NEGATIVE  NEGATIVE mg/dL   Hgb urine dipstick NEGATIVE  NEGATIVE   Bilirubin Urine NEGATIVE  NEGATIVE   Ketones, ur NEGATIVE  NEGATIVE mg/dL   Protein, ur NEGATIVE  NEGATIVE mg/dL   Urobilinogen, UA 0.2  0.0 - 1.0 mg/dL   Nitrite NEGATIVE  NEGATIVE   Leukocytes, UA NEGATIVE  NEGATIVE   Dg Chest Port 1 View  04/29/2013   CLINICAL DATA:  Cough and congestion.  EXAM: PORTABLE CHEST - 1 VIEW  COMPARISON:  06/15/2012 .  FINDINGS: Mediastinum and hilar structures are normal. Cardiomegaly with pulmonary vascular prominence and interstitial prominence noted suggesting CHF with pulmonary interstitial edema. Interstitial disease may also be related to active interstitial pneumonitis or interstitial fibrosis.  Ill-defined infiltrate right lower lobe is present. This may represent pulmonary edema or pneumonia. Mild pleural effusions noted bilaterally . No pneumothorax. Degenerative changes both shoulders and thoracic spine.  IMPRESSION: 1. Findings suggesting  congestive heart failure with pulmonary interstitial edema. Superimposed pneumonitis or underlying pulmonary interstitial fibrosis may be present. 2. Right lower lobe infiltrate noted suggesting pneumonia. Small pleural effusions cannot be excluded.   Electronically Signed   By: Maisie Fus  Register   On: 04/29/2013 12:21     ASSESSMENT:  1.  LBBB  This is not a new finding  EKG from 2012 had this pattern, not since.  May be rate related.  Would follow  She should have an echo to define LV function If LVEF isnormal then I would recomm Lexiscan myoview once she has recovered from infection (outpatient)  If EF depressed would recomm cath  2.Congestive heart failure.  Exam and  Xray suggest some volume overload, not bad.   I would recomm giving tomorrows lasix IV  May be related to infection.  Rx infection Strict I/IOs  Follow labs, exam.  Echo.  3.  Murmur  Echo ? MR    4.  Pulm  Being treated for COPD exacerbation and pneumonia.    5.  HTN  Follow  6.  Tobacco use  Will need counselling

## 2013-04-29 NOTE — ED Provider Notes (Signed)
CSN: [id number]     Arrival date & time 04/29/13  1129 History   First MD Initiated Contact with Patient 04/29/13 1142     Chief Complaint  Patient presents with  . Shortness of Breath    HPI  Jill White has history of COPD. She's been admitted to hospital for this. She continues to smoke. She's been short of breath for a week. She states she thought she had fever for the last 2 days. She has pain in her chest when she coughs but only when she coughs. She hasn't had extremity swelling.  She was at her physician's office today. She's hypoxemic with saturations in the 60s. She's had a productive cough. She did not improve with nebulized albuterol. She drove herself to the emergency room.  Past Medical History  Diagnosis Date  . HIP PAIN 03/06/2007  . HYPERTENSION 02/09/2007  . Restless leg syndrome   . COPD (chronic obstructive pulmonary disease)   . Complication of anesthesia   . Ulcer     gastric antrum    Past Surgical History  Procedure Laterality Date  . Abdominal hysterectomy    . Dilation and curettage of uterus    . Tonsillectomy    . Esophagogastroduodenoscopy  07/09/2011    Procedure: ESOPHAGOGASTRODUODENOSCOPY (EGD);  Surgeon: Barrie Folk, MD;  Location: [facility];  Service: Endoscopy;  Laterality: N/A;  patient in room 1532  . Cataract extraction w/ intraocular lens  implant, bilateral Bilateral 03/2013    Patient claims it was within the month.    Family History  Problem Relation Age of Onset  . Heart disease Sister   . Heart disease Brother   . Dementia Mother   . Anemia Father    History  Substance Use Topics  . Smoking status: Current Every Day Smoker -- 0.50 packs/day    Types: Cigarettes  . Smokeless tobacco: Never Used  . Alcohol Use: 0.5 oz/week    1 drink(s) per week   OB History   Grav Para Term Preterm Abortions TAB SAB Ect Mult Living                 Review of Systems  Constitutional: Positive for fever. Negative for chills, diaphoresis,  appetite change and fatigue.  HENT: Negative for mouth sores, sore throat and trouble swallowing.   Eyes: Negative for visual disturbance.  Respiratory: Positive for cough and shortness of breath. Negative for chest tightness and wheezing.   Cardiovascular: Positive for chest pain. Negative for leg swelling.  Gastrointestinal: Negative for nausea, vomiting, abdominal pain, diarrhea and abdominal distention.  Endocrine: Negative for polydipsia, polyphagia and polyuria.  Genitourinary: Negative for dysuria, frequency and hematuria.  Musculoskeletal: Negative for gait problem.  Skin: Negative for color change, pallor and rash.  Neurological: Negative for dizziness, syncope, light-headedness and headaches.  Hematological: Does not bruise/bleed easily.  Psychiatric/Behavioral: Negative for behavioral problems and confusion.    Allergies  Tetanus toxoid; Codeine; Erythromycin; Meloxicam; Penicillins; and Prednisone  Home Medications   Current Outpatient Rx  Name  Route  Sig  Dispense  Refill  . albuterol-ipratropium (COMBIVENT) 18-103 MCG/ACT inhaler   Inhalation   Inhale 2 puffs into the lungs every 6 (six) hours as needed for wheezing.   1 Inhaler   1     Via spacer device.   Marland Kitchen amLODipine (NORVASC) 5 MG tablet      Take one tablet by mouth one time daily   90 tablet   0   . aspirin  81 MG tablet   Oral   Take 81 mg by mouth daily.           . calcium-vitamin D (OSCAL WITH D) 500-200 MG-UNIT per tablet   Oral   Take 1 tablet by mouth daily.         . clonazePAM (KLONOPIN) 0.5 MG tablet   Oral   Take 2 tablets (1 mg total) by mouth at bedtime.   60 tablet   5   . furosemide (LASIX) 20 MG tablet   Oral   Take 20 mg by mouth daily as needed for fluid.         Marland Kitchen guaiFENesin (MUCINEX) 600 MG 12 hr tablet   Oral   Take 1 tablet (600 mg total) by mouth 2 (two) times daily.   14 tablet   0   . losartan (COZAAR) 50 MG tablet   Oral   Take 50 mg by mouth daily.          . ondansetron (ZOFRAN) 4 MG tablet      Take one tablet by mouth every four hours as needed  for nausea   60 tablet   4   . vitamin E 100 UNIT capsule   Oral   Take 100 Units by mouth daily.          BP 131/81  Pulse 93  Temp(Src) 98.2 F (36.8 C) (Oral)  Resp 23  SpO2 89% Physical Exam  Constitutional: She is oriented to person, place, and time. She appears well-developed and well-nourished. She appears distressed.  Awake. Sitting the bedside. She is able to stand and transfer to the bed. 84% on room air at the bedside. Dyspneic with conversation  HENT:  Head: Normocephalic.  Eyes: Conjunctivae are normal. Pupils are equal, round, and reactive to light. No scleral icterus.  Neck: Normal range of motion. Neck supple. No thyromegaly present.  Cardiovascular: Regular rhythm and normal heart sounds.  Exam reveals no gallop and no friction rub.   No murmur heard. Sinus rhythm.  Pulmonary/Chest: Accessory muscle usage present. Tachypnea noted. No respiratory distress. She has wheezes in the right upper field, the right middle field, the right lower field, the left upper field and the left middle field.  Globally diminished breath sounds. Wheezing and prolongation. Not a symmetric. No focal basal or diminished breath sounds.  Abdominal: Soft. Bowel sounds are normal. She exhibits no distension. There is no tenderness. There is no rebound.  Musculoskeletal: Normal range of motion.  Neurological: She is alert and oriented to person, place, and time.  Skin: Skin is warm and dry. No rash noted.  Psychiatric: She has a normal mood and affect. Her behavior is normal.    ED Course  Procedures (including critical care time) Labs Review Labs Reviewed  BLOOD GAS, ARTERIAL - Abnormal; Notable for the following:    pCO2 arterial 45.5 (*)    pO2, Arterial 54.0 (*)    Bicarbonate 30.7 (*)    Acid-Base Excess 6.1 (*)    All other components within normal limits  CBC WITH  DIFFERENTIAL - Abnormal; Notable for the following:    Monocytes Relative 13 (*)    All other components within normal limits  BASIC METABOLIC PANEL - Abnormal; Notable for the following:    Sodium 126 (*)    Chloride 88 (*)    Glucose, Bld 107 (*)    BUN 4 (*)    Creatinine, Ser 0.39 (*)    All other components  within normal limits  PRO B NATRIURETIC PEPTIDE - Abnormal; Notable for the following:    Pro B Natriuretic peptide (BNP) 518.4 (*)    All other components within normal limits  CULTURE, BLOOD (ROUTINE X 2)  CULTURE, BLOOD (ROUTINE X 2)  RESPIRATORY VIRUS PANEL  TROPONIN I  URINALYSIS, ROUTINE W REFLEX MICROSCOPIC   Imaging Review Dg Chest Port 1 View  04/29/2013   CLINICAL DATA:  Cough and congestion.  EXAM: PORTABLE CHEST - 1 VIEW  COMPARISON:  06/15/2012 .  FINDINGS: Mediastinum and hilar structures are normal. Cardiomegaly with pulmonary vascular prominence and interstitial prominence noted suggesting CHF with pulmonary interstitial edema. Interstitial disease may also be related to active interstitial pneumonitis or interstitial fibrosis.  Ill-defined infiltrate right lower lobe is present. This may represent pulmonary edema or pneumonia. Mild pleural effusions noted bilaterally . No pneumothorax. Degenerative changes both shoulders and thoracic spine.  IMPRESSION: 1. Findings suggesting congestive heart failure with pulmonary interstitial edema. Superimposed pneumonitis or underlying pulmonary interstitial fibrosis may be present. 2. Right lower lobe infiltrate noted suggesting pneumonia. Small pleural effusions cannot be excluded.   Electronically Signed   By: Maisie Fus  Register   On: 04/29/2013 12:21    EKG Interpretation   None       MDM   1. Community acquired pneumonia    She improved considerably here with nebulized albuterol. On reexam her lungs are clear. She is to requiring 2 L of nasal cannula O2. Chest x-ray suggests some volume overload but her BNP is only  500. Clinically she does not have a gallop. She does not have crackles. She does not have dependent edema. Given IV Levaquin. Plan is treatment for community acquired pneumonia. Was started and typical Zithromax and Rocephin because of intolerance to penicillins and macrolides.  Care was discussed with hospitalist. She will be admitted.    Roney Marion, MD 04/29/13 [phone]

## 2013-04-30 ENCOUNTER — Inpatient Hospital Stay (HOSPITAL_COMMUNITY): Payer: Medicare Other

## 2013-04-30 DIAGNOSIS — J9600 Acute respiratory failure, unspecified whether with hypoxia or hypercapnia: Secondary | ICD-10-CM

## 2013-04-30 DIAGNOSIS — J984 Other disorders of lung: Secondary | ICD-10-CM

## 2013-04-30 DIAGNOSIS — J441 Chronic obstructive pulmonary disease with (acute) exacerbation: Secondary | ICD-10-CM

## 2013-04-30 LAB — RESPIRATORY VIRUS PANEL
Influenza A H3: NOT DETECTED
Influenza A: NOT DETECTED
Influenza B: NOT DETECTED
Parainfluenza 2: NOT DETECTED
Parainfluenza 3: NOT DETECTED

## 2013-04-30 LAB — CBC WITH DIFFERENTIAL/PLATELET
Basophils Absolute: 0 10*3/uL (ref 0.0–0.1)
Eosinophils Absolute: 0 10*3/uL (ref 0.0–0.7)
Eosinophils Relative: 0 % (ref 0–5)
HCT: 38.5 % (ref 36.0–46.0)
Hemoglobin: 13.3 g/dL (ref 12.0–15.0)
Lymphocytes Relative: 10 % — ABNORMAL LOW (ref 12–46)
Lymphs Abs: 0.5 10*3/uL — ABNORMAL LOW (ref 0.7–4.0)
MCH: 33.6 pg (ref 26.0–34.0)
MCV: 97.2 fL (ref 78.0–100.0)
Monocytes Absolute: 0.1 10*3/uL (ref 0.1–1.0)
Monocytes Relative: 1 % — ABNORMAL LOW (ref 3–12)
RBC: 3.96 MIL/uL (ref 3.87–5.11)
RDW: 12.2 % (ref 11.5–15.5)
WBC: 5.2 10*3/uL (ref 4.0–10.5)

## 2013-04-30 LAB — BASIC METABOLIC PANEL
BUN: 6 mg/dL (ref 6–23)
CO2: 29 mEq/L (ref 19–32)
Calcium: 8.3 mg/dL — ABNORMAL LOW (ref 8.4–10.5)
Chloride: 90 mEq/L — ABNORMAL LOW (ref 96–112)
Creatinine, Ser: 0.36 mg/dL — ABNORMAL LOW (ref 0.50–1.10)
Glucose, Bld: 182 mg/dL — ABNORMAL HIGH (ref 70–99)
Potassium: 4.1 mEq/L (ref 3.5–5.1)
Sodium: 126 mEq/L — ABNORMAL LOW (ref 135–145)

## 2013-04-30 LAB — URINE CULTURE: Culture: NO GROWTH

## 2013-04-30 LAB — TROPONIN I: Troponin I: <0.3 ng/mL (ref <0.30)

## 2013-04-30 LAB — GLUCOSE, CAPILLARY: Glucose-Capillary: 134 mg/dL — ABNORMAL HIGH (ref 70–99)

## 2013-04-30 LAB — LEGIONELLA ANTIGEN, URINE

## 2013-04-30 MED ORDER — MAGNESIUM SULFATE 40 MG/ML IJ SOLN
2.0000 g | Freq: Once | INTRAMUSCULAR | Status: AC
  Administered 2013-04-30: 09:00:00 2 g via INTRAVENOUS
  Filled 2013-04-30: qty 50

## 2013-04-30 MED ORDER — DIPHENHYDRAMINE HCL 12.5 MG/5ML PO ELIX
12.5000 mg | ORAL_SOLUTION | Freq: Once | ORAL | Status: AC
  Administered 2013-04-30: 12.5 mg via ORAL
  Filled 2013-04-30: qty 5

## 2013-04-30 MED ORDER — METHYLPREDNISOLONE SODIUM SUCC 125 MG IJ SOLR
60.0000 mg | Freq: Two times a day (BID) | INTRAMUSCULAR | Status: DC
  Administered 2013-04-30 – 2013-05-01 (×2): 60 mg via INTRAVENOUS
  Filled 2013-04-30 (×3): qty 0.96

## 2013-04-30 MED ORDER — INSULIN ASPART 100 UNIT/ML ~~LOC~~ SOLN
0.0000 [IU] | Freq: Three times a day (TID) | SUBCUTANEOUS | Status: DC
Start: 2013-05-01 — End: 2013-05-03
  Administered 2013-05-01 (×3): 1 [IU] via SUBCUTANEOUS
  Administered 2013-05-02: 18:00:00 2 [IU] via SUBCUTANEOUS

## 2013-04-30 NOTE — Evaluation (Signed)
Occupational Therapy Evaluation Patient Details Name: Jill White MRN: [id number] DOB: January 31, 1941 Today's Date: 04/30/2013 Time: 2956-2130 OT Time Calculation (min): 14 min  OT Assessment / Plan / Recommendation History of present illness pt admitted wtih acute respiratory distress. H/O COPD and has CAP   Clinical Impression   Pt seen for one time evaluation.  Pt is independent with basic adls (and has housekeeper 2x/mo).      OT Assessment  Patient does not need any further OT services    Follow Up Recommendations  No OT follow up    Barriers to Discharge      Equipment Recommendations  None recommended by OT    Recommendations for Other Services    Frequency       Precautions / Restrictions Precautions Precautions: None Restrictions Weight Bearing Restrictions: No   Pertinent Vitals/Pain No pain.  Removed 02 to walk to and use toilet.  When returned to eob sats 94%; reapplied 4 liters 02 and sats 98%     ADL  Grooming: Independent;Wash/dry hands Where Assessed - Grooming: Unsupported standing Toilet Transfer: Performed;Independent Toilet Transfer Method: Sit to Barista: Comfort height toilet Transfers/Ambulation Related to ADLs: pt ambulated into bathroom independently.  She feels back at baseline, but she is using 02 at this time and normally doesn't.  When back to bed, sats 94%, rose to 98% when 02 reapplied. ADL Comments: Pt independent with basic adls.  She verbalizes understanding of energy conservation.  She is very active and has been the caregiver for others.      OT Diagnosis:    OT Problem List:   OT Treatment Interventions:     OT Goals(Current goals can be found in the care plan section)    Visit Information  Last OT Received On: 04/30/13 History of Present Illness: pt admitted wtih acute respiratory distress. H/O COPD and has CAP       Prior Functioning     Home Living Family/patient expects to be discharged to::  Private residence Living Arrangements: Alone Additional Comments: has grab bar in shower; does not use 02 at home Prior Function Level of Independence: Independent Communication Communication: No difficulties         Vision/Perception     Cognition  Cognition Arousal/Alertness: Awake/alert Behavior During Therapy: WFL for tasks assessed/performed Overall Cognitive Status: Within Functional Limits for tasks assessed    Extremity/Trunk Assessment Upper Extremity Assessment Upper Extremity Assessment: Overall WFL for tasks assessed     Mobility Bed Mobility Bed Mobility: Supine to Sit Supine to Sit: 7: Independent Transfers Transfers: Sit to Stand;Stand to Sit Sit to Stand: 7: Independent Stand to Sit: 7: Independent     Exercise     Balance     End of Session OT - End of Session Activity Tolerance: Patient tolerated treatment well Patient left: with nursing/sitter in room (eob)  GO     Brenn Gatton 04/30/2013, 9:41 AM Marica Otter, OTR/L [phone] 04/30/2013

## 2013-04-30 NOTE — Clinical Documentation Improvement (Signed)
  Noted 04/29/13 H&P "acute respiratory distress. Likely multifactorial secondary to community-acquired pneumonia and probable early COPD exacerbation. Patient looks clinically dry. We'll admit patient to telemetry. Check blood cultures x2. Check a sputum Gram stain and culture. Check a urine Legionella and urine pneumococcus antigen. Placed empirically on IV Levaquin, IV steroids, oxygen, scheduled nebulizer treatments, Mucinex. Follow"..."ABG had a pH of 7.44 PCO2 45 PO2 54".  For accurate DX specificity & severity can noted "acute respiratory failure" be further specified w/ cond being mon'd, eval'd & tx'd. Thank you  Possible Clinical Conditions? Acute Respiratory Failure Acute on Chronic Respiratory Failure Chronic Respiratory Failure Other Condition Cannot Clinically Determine   Supporting Information: Risk Factors:See above note Signs & Symptoms:See above note Diagnostics:See above note Treatment:See above note  Thank You, Toribio Harbour, RN, BSN, CCDS Certified Clinical Documentation Specialist Pager: [phone] [facility]- Health Information Management

## 2013-04-30 NOTE — Progress Notes (Signed)
[facility] PROGRESS NOTE  Jill White ZOX:[id number] DOB: [birth year] DOA: 04/29/2013 PCP: Nelwyn Salisbury, MD  Assessment/Plan: #1 acute respiratory failure Likely secondary to community acquired pneumonia and COPD exacerbation. Clinical improvement. Continue empiric IV Levaquin. Continue oxygen, nebulizer treatments, Mucinex. Will decrease IV Solu-Medrol to 60 mg every 12 and quickly taper. Patient looks clinically dry on examination. Follow.  #2 community acquired pneumonia Clinical improvement. Continue oxygen, nebulizer treatments, IV Levaquin.  #3 probable early acute COPD exacerbation Likely triggered by problem #2. Clinical improvement. Continue oxygen, nebulizer treatments, IV Levaquin, change IV Solu-Medrol to 60 mg IV every 12 and taper quickly. Follow.  #4 hypomagnesemia Replete.  #5 left bundle branch block Per cardiology patient was noted to have a left bundle branch in 2012. Cardiac enzymes are negative x3. 2-D echo is pending. If left ventricular function is normal cardiology recommended outpatient nuclear study to exclude coronary disease. A left ventricular function is abnormal may likely need an inpatient study such as a cardiac catheterization. Per cardiology.  #6 hypertension Patient's blood pressure noted to drop into the mid 90s after blood pressure medications were given. Will discontinue Cozaar for now. Continue Norvasc.  #7 hyponatremia Stable. No significant change. Will saline lock IV fluids. Follow. If worsening may consider a dose of IV Lasix. Urine studies pending.  #8 tobacco abuse Tobacco cessation. Continue nicotine patch.  #9 prophylaxis PPI for GI prophylaxis. Lovenox for DVT prophylaxis.  Code Status: Full Family Communication: Updated patient no family at bedside. Disposition Plan: Home when medically stable.   Consultants:  Cardiology: Dr. Tenny Craw 04/29/2013  Procedures:  Chest x-ray 04/29/2013, 04/30/2013  2-D echo  pending  Antibiotics:  IV Levaquin 04/29/2013  HPI/Subjective: Patient states she's feeling a whole lot better. Breathing has improved. Patient states cough is improved.  Objective: Filed Vitals:   04/30/13 0905  BP: 114/57  Pulse:   Temp:   Resp:     Intake/Output Summary (Last 24 hours) at 04/30/13 1349 Last data filed at 04/30/13 1100  Gross per 24 hour  Intake   1620 ml  Output      1 ml  Net   1619 ml   Filed Weights   04/29/13 1408 04/29/13 1900  Weight: 58.1 kg (128 lb 1.4 oz) 58.287 kg (128 lb 8 oz)    Exam:   General:  NAD  Cardiovascular: Regular rate rhythm no murmurs rubs or gallops.  Respiratory: Minimal wheezing.  Abdomen: Soft, nontender, nondistended, positive bowel sounds.  Musculoskeletal: No clubbing cyanosis or edema.  Data Reviewed: Basic Metabolic Panel:  Recent Labs Lab 04/29/13 1245 04/29/13 1906 04/30/13 0100  NA 126*  --  126*  K 4.2  --  4.1  CL 88*  --  90*  CO2 27  --  29  GLUCOSE 107*  --  182*  BUN 4*  --  6  CREATININE 0.39*  --  0.36*  CALCIUM 9.1  --  8.3*  MG  --  1.4*  --    Liver Function Tests: No results found for this basename: AST, ALT, ALKPHOS, BILITOT, PROT, ALBUMIN,  in the last 168 hours No results found for this basename: LIPASE, AMYLASE,  in the last 168 hours No results found for this basename: AMMONIA,  in the last 168 hours CBC:  Recent Labs Lab 04/29/13 1245 04/30/13 0100  WBC 7.5 5.2  NEUTROABS 4.7 4.7  HGB 13.8 13.3  HCT 40.5 38.5  MCV 97.4 97.2  PLT 345 335   Cardiac Enzymes:  Recent Labs Lab 04/29/13 1245 04/29/13 1904 04/30/13 0100 04/30/13 0645  TROPONINI <0.30 <0.30 <0.30 <0.30   BNP (last 3 results)  Recent Labs  04/29/13 1228  PROBNP 518.4*   CBG: No results found for this basename: GLUCAP,  in the last 168 hours  Recent Results (from the past 240 hour(s))  CULTURE, BLOOD (ROUTINE X 2)     Status: None   Collection Time    04/29/13 12:40 PM      Result  Value Range Status   Specimen Description BLOOD LEFT FOREARM   Final   Special Requests BOTTLES DRAWN AEROBIC ONLY   Final   Culture  Setup Time     Final   Value: 04/29/2013 15:07     Performed at [facility]   Culture     Final   Value:        BLOOD CULTURE RECEIVED NO GROWTH TO DATE CULTURE WILL BE HELD FOR 5 DAYS BEFORE ISSUING A FINAL NEGATIVE REPORT     Performed at [facility]   Report Status PENDING   Incomplete  CULTURE, BLOOD (ROUTINE X 2)     Status: None   Collection Time    04/29/13 12:45 PM      Result Value Range Status   Specimen Description BLOOD LEFT ANTECUBITAL   Final   Special Requests BOTTLES DRAWN AEROBIC ONLY   Final   Culture  Setup Time     Final   Value: 04/29/2013 15:07     Performed at [facility]   Culture     Final   Value:        BLOOD CULTURE RECEIVED NO GROWTH TO DATE CULTURE WILL BE HELD FOR 5 DAYS BEFORE ISSUING A FINAL NEGATIVE REPORT     Performed at [facility]   Report Status PENDING   Incomplete  CULTURE, EXPECTORATED SPUTUM-ASSESSMENT     Status: None   Collection Time    04/29/13  8:50 PM      Result Value Range Status   Specimen Description SPUTUM   Final   Special Requests NONE   Final   Sputum evaluation     Final   Value: THIS SPECIMEN IS ACCEPTABLE. RESPIRATORY CULTURE REPORT TO FOLLOW.   Report Status 04/29/2013 FINAL   Final  CULTURE, RESPIRATORY (NON-EXPECTORATED)     Status: None   Collection Time    04/29/13  8:50 PM      Result Value Range Status   Specimen Description SPUTUM   Final   Special Requests NONE   Final   Gram Stain     Final   Value: RARE WBC PRESENT, PREDOMINANTLY PMN     NO SQUAMOUS EPITHELIAL CELLS SEEN     RARE GRAM POSITIVE COCCI     IN PAIRS RARE GRAM VARIABLE ROD     Performed at [facility]   Culture PENDING   Incomplete   Report Status PENDING   Incomplete     Studies: Dg Chest Port 1 View  04/30/2013   CLINICAL DATA:  Cough, fever  EXAM:  PORTABLE CHEST - 1 VIEW  COMPARISON:  04/29/2013; 06/15/2012; chest CT - 08/10/2006  FINDINGS: Grossly unchanged cardiac silhouette and mediastinal contours given patient rotation. Atherosclerotic calcifications within the thoracic aorta. Worsening cephalization of flow and right mid/ lower lung heterogeneous airspace opacities. Trace right-sided effusion is not excluded. Skin fold overlies the right upper lung. No pneumothorax. Unchanged bones.  IMPRESSION: Findings suggestive  of worsening pulmonary edema and right mid/lower lung heterogeneous opacities, asymmetric alveolar pulmonary edema versus developing infection. A follow-up chest radiograph in 4 to 6 weeks after treatment is recommended to ensure resolution.   Electronically Signed   By: Simonne Come M.D.   On: 04/30/2013 07:56   Dg Chest Port 1 View  04/29/2013   CLINICAL DATA:  Cough and congestion.  EXAM: PORTABLE CHEST - 1 VIEW  COMPARISON:  06/15/2012 .  FINDINGS: Mediastinum and hilar structures are normal. Cardiomegaly with pulmonary vascular prominence and interstitial prominence noted suggesting CHF with pulmonary interstitial edema. Interstitial disease may also be related to active interstitial pneumonitis or interstitial fibrosis.  Ill-defined infiltrate right lower lobe is present. This may represent pulmonary edema or pneumonia. Mild pleural effusions noted bilaterally . No pneumothorax. Degenerative changes both shoulders and thoracic spine.  IMPRESSION: 1. Findings suggesting congestive heart failure with pulmonary interstitial edema. Superimposed pneumonitis or underlying pulmonary interstitial fibrosis may be present. 2. Right lower lobe infiltrate noted suggesting pneumonia. Small pleural effusions cannot be excluded.   Electronically Signed   By: Maisie Fus  Register   On: 04/29/2013 12:21    Scheduled Meds: . albuterol  2.5 mg Nebulization Q6H  . amLODipine  5 mg Oral Daily  . aspirin  81 mg Oral Daily  . clonazePAM  1 mg Oral QHS   . enoxaparin (LOVENOX) injection  40 mg Subcutaneous Q24H  . guaiFENesin  1,200 mg Oral BID  . ipratropium  0.5 mg Nebulization Q6H  . levofloxacin (LEVAQUIN) IV  750 mg Intravenous Q24H  . losartan  50 mg Oral Daily  . methylPREDNISolone (SOLU-MEDROL) injection  80 mg Intravenous Q8H  . nicotine  21 mg Transdermal Daily  . pantoprazole  40 mg Oral BID AC   Continuous Infusions: . sodium chloride 75 mL/hr at 04/30/13 0020    Principal Problem:   Acute respiratory failure Active Problems:   HYPERTENSION   Hyponatremia   RLS (restless legs syndrome)   Tobacco abuse   Dehydration   COPD exacerbation   CAP (community acquired pneumonia)    Time spent: 30 minutes    [facility]  [facility] Pager [phone]. If 7PM-7AM, please contact night-coverage at [email or web address], password [facility] 04/30/2013, 1:49 PM  LOS: 1 day

## 2013-04-30 NOTE — Progress Notes (Signed)
    Subjective:  Chest soreness with coughing; mild dyspnea.   Objective:  Filed Vitals:   04/29/13 1900 04/29/13 1939 04/29/13 2148 04/30/13 0510  BP:   125/45 151/51  Pulse:   82 66  Temp:   97.6 F (36.4 C) 98 F (36.7 C)  TempSrc:   Oral Oral  Resp:   22 22  Height: 5\' 3"  (1.6 m)     Weight: 128 lb 8 oz (58.287 kg)     SpO2:  96% 97% 99%    Intake/Output from previous day:  Intake/Output Summary (Last 24 hours) at 04/30/13 0644 Last data filed at 04/29/13 2300  Gross per 24 hour  Intake  547.5 ml  Output      1 ml  Net  546.5 ml    Physical Exam: Physical exam: Well-developed well-nourished in no acute distress.  Skin is warm and dry.  HEENT is normal.  Neck is supple.  Chest diminished BS RLL; diffuse rhonchi Cardiovascular exam is regular rate and rhythm. 2-3/6 systolic murmur LSB Abdominal exam nontender or distended. No masses palpated. Extremities show no edema. neuro grossly intact    Lab Results: Basic Metabolic Panel:  Recent Labs  16/10/96 1245 04/29/13 1906 04/30/13 0100  NA 126*  --  126*  K 4.2  --  4.1  CL 88*  --  90*  CO2 27  --  29  GLUCOSE 107*  --  182*  BUN 4*  --  6  CREATININE 0.39*  --  0.36*  CALCIUM 9.1  --  8.3*  MG  --  1.4*  --    CBC:  Recent Labs  04/29/13 1245 04/30/13 0100  WBC 7.5 5.2  NEUTROABS 4.7 4.7  HGB 13.8 13.3  HCT 40.5 38.5  MCV 97.4 97.2  PLT 345 335   Cardiac Enzymes:  Recent Labs  04/29/13 1245 04/29/13 1904 04/30/13 0100  TROPONINI <0.30 <0.30 <0.30     Assessment/Plan:  1 left bundle branch block-noted on previous electrocardiograms. Await echocardiogram. If LV function normal will arrange outpatient nuclear study to exclude coronary disease. 2 murmur-echocardiogram today. 3 pneumonia-continue antibiotics. She is not volume overloaded on examination. Hold Lasix.  4 COPD-continue bronchodilators. 5 hypertension-continue present medications.  [name] 04/30/2013, 6:44  AM

## 2013-04-30 NOTE — Evaluation (Signed)
I have reviewed this note and agree with all findings. Kati Jasmia Angst, PT, DPT Pager: [phone]   

## 2013-04-30 NOTE — Evaluation (Signed)
Physical Therapy One Time Evaluation Patient Details Name: Jill White MRN: [id number] DOB: [birth year] Today's Date: 04/30/2013 Time: 1478-2956 PT Time Calculation (min): 12 min  PT Assessment / Plan / Recommendation History of Present Illness  pt admitted wtih acute respiratory distress. H/O COPD and has CAP  Clinical Impression  Patient evaluated by Physical Therapy with no further acute PT needs identified. All education has been completed and the pt has no further questions. Pt does not require any PT follow-up or DME. PT is signing off. Thank you for this referral. Pt able to ambulate 300' with no AD and supervision to ensure safety. Pt reports she is back to PLOF. Home O2 qualification test conducted during session, see vitals section for details.    PT Assessment  Patent does not need any further PT services    Follow Up Recommendations  No PT follow up    Does the patient have the potential to tolerate intense rehabilitation      Barriers to Discharge        Equipment Recommendations  None recommended by PT    Recommendations for Other Services     Frequency      Precautions / Restrictions Precautions Precautions: Fall Restrictions Weight Bearing Restrictions: No   Pertinent Vitals/Pain No c/o pain/SOB/dizziness during session.  SATURATION QUALIFICATIONS: (This note is used to comply with regulatory documentation for home oxygen)  Patient Saturations on Room Air at Rest = 98%  Patient Saturations on Room Air while Ambulating = 92%  Pt did not require O2 [place] during ambulation as SaO2 did not drop below 92% during session. RN notified.      Mobility  Bed Mobility Bed Mobility: Not assessed Supine to Sit: 7: Independent Details for Bed Mobility Assistance: pt sitting EOB upon arrival. Transfers Transfers: Sit to Stand;Stand to Sit Sit to Stand: 7: Independent;From bed;With upper extremity assist Stand to Sit: 7: Independent;To bed;Without upper extremity  assist Details for Transfer Assistance: pt demonstrated safe and efficient technique during sit<>stand transfers. Ambulation/Gait Ambulation/Gait Assistance: 5: Supervision Ambulation Distance (Feet): 300 Feet Assistive device: None Ambulation/Gait Assistance Details: supervision to ensure safety. Gait Pattern: Within Functional Limits Gait velocity: WFL    Exercises     PT Diagnosis:    PT Problem List:   PT Treatment Interventions:       PT Goals(Current goals can be found in the care plan section) Acute Rehab PT Goals PT Goal Formulation: No goals set, d/c therapy  Visit Information  Last PT Received On: 04/30/13 Assistance Needed: +1 History of Present Illness: pt admitted wtih acute respiratory distress. 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 Communication Communication: No difficulties    Cognition  Cognition Arousal/Alertness: Awake/alert Behavior During Therapy: WFL for tasks assessed/performed Overall Cognitive Status: Within Functional Limits for tasks assessed    Extremity/Trunk Assessment Upper Extremity Assessment Upper Extremity Assessment: Overall WFL for tasks assessed Lower Extremity Assessment Lower Extremity Assessment: Overall WFL for tasks assessed   Balance    End of Session PT - End  of Session Activity Tolerance: Patient tolerated treatment well Patient left: in bed;with call bell/phone within reach;with nursing/sitter in room;with  family/visitor present (pt sitting EOB ) Nurse Communication: Mobility status  GP     Sol Blazing 04/30/2013, 10:11 AM

## 2013-04-30 NOTE — Progress Notes (Signed)
Results for COXTandra, Rosado (MRN [id number]) as of 04/30/2013 08:56  Ref. Range 04/29/2013 12:45 04/30/2013 01:00  Glucose Latest Range: 70-99 mg/dL 478 (H) 295 (H)    Lab glucose elevated presumably from steroids.  Request Hgb A1C and order for CBG's ac & HS.  Thank you.  Maeryn Mcgath S. Elsie Lincoln, RN, CNS, CDE [facility], team pager [phone]

## 2013-04-30 NOTE — Progress Notes (Signed)
BP 95/60 after receiving PO BP medications this am.  MD made aware.  Cozaar d/c.  Will continue to monitor BP.

## 2013-04-30 NOTE — Progress Notes (Signed)
Patient ambulated in hall on RA with PT and oxygen sats remained in low 90s.  Prior to ambulating, patient was on 4 L/M [place] and does not require oxygen at home.  After returning to room, patient wanted to stay off of the oxygen.  Sats were checked a little later which ranged from 92-94% on RA.  Oxygen was left on at 2 L/M so patient could use if she felt SOB.  Instructed patient to let me know if she felt SOB so I could check her levels.  Will continue to monitor and spot check throughout the day.

## 2013-05-01 DIAGNOSIS — I059 Rheumatic mitral valve disease, unspecified: Secondary | ICD-10-CM

## 2013-05-01 LAB — GLUCOSE, CAPILLARY
Glucose-Capillary: 123 mg/dL — ABNORMAL HIGH (ref 70–99)
Glucose-Capillary: 131 mg/dL — ABNORMAL HIGH (ref 70–99)

## 2013-05-01 LAB — CBC
HCT: 38.9 % (ref 36.0–46.0)
Hemoglobin: 13.3 g/dL (ref 12.0–15.0)
MCH: 33.3 pg (ref 26.0–34.0)
MCHC: 34.2 g/dL (ref 30.0–36.0)
MCV: 97.5 fL (ref 78.0–100.0)
Platelets: 357 10*3/uL (ref 150–400)
RBC: 3.99 MIL/uL (ref 3.87–5.11)
WBC: 15.1 10*3/uL — ABNORMAL HIGH (ref 4.0–10.5)

## 2013-05-01 LAB — BASIC METABOLIC PANEL
CO2: 28 mEq/L (ref 19–32)
Calcium: 9 mg/dL (ref 8.4–10.5)
Chloride: 91 mEq/L — ABNORMAL LOW (ref 96–112)
Glucose, Bld: 142 mg/dL — ABNORMAL HIGH (ref 70–99)
Potassium: 4 mEq/L (ref 3.5–5.1)
Sodium: 127 mEq/L — ABNORMAL LOW (ref 135–145)

## 2013-05-01 LAB — MAGNESIUM: Magnesium: 1.8 mg/dL (ref 1.5–2.5)

## 2013-05-01 LAB — HEMOGLOBIN A1C: Mean Plasma Glucose: 108 mg/dL (ref <117)

## 2013-05-01 MED ORDER — METHYLPREDNISOLONE SODIUM SUCC 40 MG IJ SOLR
40.0000 mg | Freq: Two times a day (BID) | INTRAMUSCULAR | Status: DC
  Administered 2013-05-01 – 2013-05-03 (×4): 40 mg via INTRAVENOUS
  Filled 2013-05-01 (×5): qty 1

## 2013-05-01 MED ORDER — DIPHENHYDRAMINE HCL 12.5 MG/5ML PO ELIX
12.5000 mg | ORAL_SOLUTION | Freq: Four times a day (QID) | ORAL | Status: DC | PRN
  Administered 2013-05-01 – 2013-05-02 (×2): 12.5 mg via ORAL
  Filled 2013-05-01 (×2): qty 5

## 2013-05-01 MED ORDER — LEVOFLOXACIN 500 MG PO TABS
500.0000 mg | ORAL_TABLET | Freq: Every day | ORAL | Status: AC
  Administered 2013-05-02: 500 mg via ORAL
  Filled 2013-05-01: qty 1

## 2013-05-01 MED ORDER — GUAIFENESIN-DM 100-10 MG/5ML PO SYRP
5.0000 mL | ORAL_SOLUTION | ORAL | Status: DC | PRN
  Administered 2013-05-01 – 2013-05-02 (×3): 5 mL via ORAL
  Filled 2013-05-01 (×3): qty 10

## 2013-05-01 NOTE — Progress Notes (Signed)
Echocardiogram 2D Echocardiogram has been performed.  Jill White 05/01/2013, 2:21 PM

## 2013-05-01 NOTE — Progress Notes (Signed)
    Subjective:  Chest soreness with coughing; denies dyspnea   Objective:  Filed Vitals:   04/30/13 2100 05/01/13 0138 05/01/13 0500 05/01/13 0501  BP: 124/43  131/69 160/63  Pulse: 105  76 79  Temp: 98 F (36.7 C)  98.7 F (37.1 C)   TempSrc: Oral  Oral   Resp: 20  21   Height:      Weight:      SpO2: 90% 92% 93%     Intake/Output from previous day:  Intake/Output Summary (Last 24 hours) at 05/01/13 0655 Last data filed at 05/01/13 0030  Gross per 24 hour  Intake 2348.75 ml  Output      0 ml  Net 2348.75 ml    Physical Exam: Physical exam: Well-developed well-nourished in no acute distress.  Skin is warm and dry.  HEENT is normal.  Neck is supple.  Chest diminished BS RLL Cardiovascular exam is regular rate and rhythm. 2-3/6 systolic murmur LSB Abdominal exam nontender or distended. No masses palpated. Extremities show no edema. neuro grossly intact    Lab Results: Basic Metabolic Panel:  Recent Labs  65/78/46 1906 04/30/13 0100 05/01/13 0446  NA  --  126* 127*  K  --  4.1 4.0  CL  --  90* 91*  CO2  --  29 28  GLUCOSE  --  182* 142*  BUN  --  6 8  CREATININE  --  0.36* 0.43*  CALCIUM  --  8.3* 9.0  MG 1.4*  --  1.8   CBC:  Recent Labs  04/29/13 1245 04/30/13 0100 05/01/13 0446  WBC 7.5 5.2 15.1*  NEUTROABS 4.7 4.7  --   HGB 13.8 13.3 13.3  HCT 40.5 38.5 38.9  MCV 97.4 97.2 97.5  PLT 345 335 357   Cardiac Enzymes:  Recent Labs  04/29/13 1904 04/30/13 0100 04/30/13 0645  TROPONINI <0.30 <0.30 <0.30     Assessment/Plan:  1 left bundle branch block-noted on previous electrocardiograms. Await echocardiogram (will reorder). If LV function normal will arrange outpatient nuclear study to exclude coronary disease. 2 murmur-echocardiogram today. 3 pneumonia-continue antibiotics. She is not volume overloaded on examination. Continue to hold Lasix.  4 COPD-continue bronchodilators. 5 hypertension-continue present medications.  [name] 05/01/2013, 6:55 AM

## 2013-05-01 NOTE — Progress Notes (Signed)
[facility] PROGRESS NOTE  ELSPETH BLUCHER ZOX:[id number] DOB: [birth year] DOA: 04/29/2013 PCP: Nelwyn Salisbury, MD  Assessment/Plan: #1 acute respiratory failure Likely secondary to community acquired pneumonia and COPD exacerbation. Clinical improvement. Continue empiric IV Levaquin. Continue oxygen, nebulizer treatments, Mucinex. Will decrease IV Solu-Medrol to 60 mg every 12 and quickly taper. Patient looks clinically dry on examination. Follow.  #2 community acquired pneumonia Clinical improvement. Continue oxygen, nebulizer treatments, IV Levaquin.  #3 probable early acute COPD exacerbation Likely triggered by problem #2. Clinical improvement. Continue oxygen, nebulizer treatments, IV Levaquin, change IV Solu-Medrol to 60 mg IV every 12 and taper quickly. Follow.  #4 hypomagnesemia Replete.  #5 left bundle branch block Per cardiology patient was noted to have a left bundle branch in 2012. Cardiac enzymes are negative x3. 2-D echo is pending. If left ventricular function is normal cardiology recommended outpatient nuclear study to exclude coronary disease. A left ventricular function is abnormal may likely need an inpatient study such as a cardiac catheterization. Per cardiology.  #6 hypertension suboptimal. Continue Norvasc.  #7 hyponatremia Stable. No significant change. Will saline lock IV fluids. Follow. If worsening may consider a dose of IV Lasix. Urine studies pending.  #8 tobacco abuse Tobacco cessation. Continue nicotine patch.  #9 prophylaxis PPI for GI prophylaxis. Lovenox for DVT prophylaxis.  #10 leukocytosis - probably reactive from steroids.   Code Status: Full Family Communication: Updated patient no family at bedside. Disposition Plan: Home when medically stable.   Consultants:  Cardiology: Dr. Tenny Craw 04/29/2013  Procedures:  Chest x-ray 04/29/2013, 04/30/2013  2-D echo pending  Antibiotics:  IV Levaquin 04/29/2013  HPI/Subjective: Patient  states she's feeling a whole lot better. Breathing has improved. Patient states cough is improved.  Objective: Filed Vitals:   05/01/13 1507  BP: 145/55  Pulse: 78  Temp: 98.7 F (37.1 C)  Resp: 16    Intake/Output Summary (Last 24 hours) at 05/01/13 1633 Last data filed at 05/01/13 1527  Gross per 24 hour  Intake 1248.75 ml  Output      0 ml  Net 1248.75 ml   Filed Weights   04/29/13 1408 04/29/13 1900  Weight: 58.1 kg (128 lb 1.4 oz) 58.287 kg (128 lb 8 oz)    Exam:   General:  NAD  Cardiovascular: Regular rate rhythm no murmurs rubs or gallops.  Respiratory: Minimal wheezing.  Abdomen: Soft, nontender, nondistended, positive bowel sounds.  Musculoskeletal: No clubbing cyanosis or edema.  Data Reviewed: Basic Metabolic Panel:  Recent Labs Lab 04/29/13 1245 04/29/13 1906 04/30/13 0100 05/01/13 0446  NA 126*  --  126* 127*  K 4.2  --  4.1 4.0  CL 88*  --  90* 91*  CO2 27  --  29 28  GLUCOSE 107*  --  182* 142*  BUN 4*  --  6 8  CREATININE 0.39*  --  0.36* 0.43*  CALCIUM 9.1  --  8.3* 9.0  MG  --  1.4*  --  1.8   Liver Function Tests: No results found for this basename: AST, ALT, ALKPHOS, BILITOT, PROT, ALBUMIN,  in the last 168 hours No results found for this basename: LIPASE, AMYLASE,  in the last 168 hours No results found for this basename: AMMONIA,  in the last 168 hours CBC:  Recent Labs Lab 04/29/13 1245 04/30/13 0100 05/01/13 0446  WBC 7.5 5.2 15.1*  NEUTROABS 4.7 4.7  --   HGB 13.8 13.3 13.3  HCT 40.5 38.5 38.9  MCV 97.4 97.2 97.5  PLT 345 335 357   Cardiac Enzymes:  Recent Labs Lab 04/29/13 1245 04/29/13 1904 04/30/13 0100 04/30/13 0645  TROPONINI <0.30 <0.30 <0.30 <0.30   BNP (last 3 results)  Recent Labs  04/29/13 1228  PROBNP 518.4*   CBG:  Recent Labs Lab 04/30/13 2315 05/01/13 0727 05/01/13 1217  GLUCAP 134* 123* 131*    Recent Results (from the past 240 hour(s))  CULTURE, BLOOD (ROUTINE X 2)      Status: None   Collection Time    04/29/13 12:40 PM      Result Value Range Status   Specimen Description BLOOD LEFT FOREARM   Final   Special Requests BOTTLES DRAWN AEROBIC ONLY   Final   Culture  Setup Time     Final   Value: 04/29/2013 15:07     Performed at [facility]   Culture     Final   Value:        BLOOD CULTURE RECEIVED NO GROWTH TO DATE CULTURE WILL BE HELD FOR 5 DAYS BEFORE ISSUING A FINAL NEGATIVE REPORT     Performed at [facility]   Report Status PENDING   Incomplete  CULTURE, BLOOD (ROUTINE X 2)     Status: None   Collection Time    04/29/13 12:45 PM      Result Value Range Status   Specimen Description BLOOD LEFT ANTECUBITAL   Final   Special Requests BOTTLES DRAWN AEROBIC ONLY   Final   Culture  Setup Time     Final   Value: 04/29/2013 15:07     Performed at [facility]   Culture     Final   Value:        BLOOD CULTURE RECEIVED NO GROWTH TO DATE CULTURE WILL BE HELD FOR 5 DAYS BEFORE ISSUING A FINAL NEGATIVE REPORT     Performed at [facility]   Report Status PENDING   Incomplete  URINE CULTURE     Status: None   Collection Time    04/29/13  4:24 PM      Result Value Range Status   Specimen Description URINE, RANDOM   Final   Special Requests NONE   Final   Culture  Setup Time     Final   Value: 04/29/2013 20:38     Performed at [facility]   Culture     Final   Value: NO GROWTH     Performed at [facility]   Report Status 04/30/2013 FINAL   Final  RESPIRATORY VIRUS PANEL     Status: None   Collection Time    04/29/13  4:49 PM      Result Value Range Status   Source - RVPAN NOSE   Final   Respiratory Syncytial Virus A NOT DETECTED   Final   Respiratory Syncytial Virus B NOT DETECTED   Final   Influenza A NOT DETECTED   Final   Influenza B NOT DETECTED   Final   Parainfluenza 1 NOT DETECTED   Final   Parainfluenza 2 NOT DETECTED   Final   Parainfluenza 3 NOT DETECTED   Final    Metapneumovirus NOT DETECTED   Final   Rhinovirus NOT DETECTED   Final   Adenovirus NOT DETECTED   Final   Influenza A H1 NOT DETECTED   Final   Influenza A H3 NOT DETECTED   Final   Comment: (NOTE)  Normal Reference Range for each Analyte: NOT DETECTED     Testing performed using the Luminex xTAG Respiratory Viral Panel test     kit.     This test was developed and its performance characteristics determined     by [facility]. It has not been cleared or approved by the [place]     Food and Drug Administration. This test is used for clinical purposes.     It should not be regarded as investigational or for research. This     laboratory is certified under the Clinical Laboratory Improvement     Amendments of 1988 (CLIA) as qualified to perform high complexity     clinical laboratory testing.     Performed at [facility]  CULTURE, EXPECTORATED SPUTUM-ASSESSMENT     Status: None   Collection Time    04/29/13  8:50 PM      Result Value Range Status   Specimen Description SPUTUM   Final   Special Requests NONE   Final   Sputum evaluation     Final   Value: THIS SPECIMEN IS ACCEPTABLE. RESPIRATORY CULTURE REPORT TO FOLLOW.   Report Status 04/29/2013 FINAL   Final  CULTURE, RESPIRATORY (NON-EXPECTORATED)     Status: None   Collection Time    04/29/13  8:50 PM      Result Value Range Status   Specimen Description SPUTUM   Final   Special Requests NONE   Final   Gram Stain     Final   Value: RARE WBC PRESENT, PREDOMINANTLY PMN     NO SQUAMOUS EPITHELIAL CELLS SEEN     RARE GRAM POSITIVE COCCI     IN PAIRS RARE GRAM VARIABLE ROD     Performed at [facility]   Culture     Final   Value: NORMAL OROPHARYNGEAL FLORA     Performed at [facility]   Report Status PENDING   Incomplete     Studies: Dg Chest Port 1 View  04/30/2013   CLINICAL DATA:  Cough, fever  EXAM: PORTABLE CHEST - 1 VIEW  COMPARISON:  04/29/2013; 06/15/2012; chest CT -  08/10/2006  FINDINGS: Grossly unchanged cardiac silhouette and mediastinal contours given patient rotation. Atherosclerotic calcifications within the thoracic aorta. Worsening cephalization of flow and right mid/ lower lung heterogeneous airspace opacities. Trace right-sided effusion is not excluded. Skin fold overlies the right upper lung. No pneumothorax. Unchanged bones.  IMPRESSION: Findings suggestive of worsening pulmonary edema and right mid/lower lung heterogeneous opacities, asymmetric alveolar pulmonary edema versus developing infection. A follow-up chest radiograph in 4 to 6 weeks after treatment is recommended to ensure resolution.   Electronically Signed   By: Simonne Come M.D.   On: 04/30/2013 07:56    Scheduled Meds: . albuterol  2.5 mg Nebulization Q6H  . amLODipine  5 mg Oral Daily  . aspirin  81 mg Oral Daily  . clonazePAM  1 mg Oral QHS  . enoxaparin (LOVENOX) injection  40 mg Subcutaneous Q24H  . insulin aspart  0-9 Units Subcutaneous TID WC  . ipratropium  0.5 mg Nebulization Q6H  . levofloxacin (LEVAQUIN) IV  750 mg Intravenous Q24H  . methylPREDNISolone (SOLU-MEDROL) injection  60 mg Intravenous Q12H  . nicotine  21 mg Transdermal Daily  . pantoprazole  40 mg Oral BID AC   Continuous Infusions:    Principal Problem:   Acute respiratory failure Active Problems:   HYPERTENSION   Hyponatremia   RLS (restless legs  syndrome)   Tobacco abuse   Dehydration   COPD exacerbation   CAP (community acquired pneumonia)    Time spent: 20 minutes    Nilam Quakenbush  [facility] Pager [phone] If 7PM-7AM, please contact night-coverage at [email or web address], password [facility] 05/01/2013, 4:33 PM  LOS: 2 days

## 2013-05-02 LAB — GLUCOSE, CAPILLARY
Glucose-Capillary: 109 mg/dL — ABNORMAL HIGH (ref 70–99)
Glucose-Capillary: 162 mg/dL — ABNORMAL HIGH (ref 70–99)
Glucose-Capillary: 111 mg/dL — ABNORMAL HIGH (ref 70–99)

## 2013-05-02 LAB — BASIC METABOLIC PANEL
BUN: 10 mg/dL (ref 6–23)
CO2: 28 mEq/L (ref 19–32)
Calcium: 9.5 mg/dL (ref 8.4–10.5)
Chloride: 89 mEq/L — ABNORMAL LOW (ref 96–112)
Creatinine, Ser: 0.51 mg/dL (ref 0.50–1.10)
GFR calc Af Amer: >90 mL/min (ref >90)
Glucose, Bld: 117 mg/dL — ABNORMAL HIGH (ref 70–99)
Potassium: 3.7 mEq/L (ref 3.5–5.1)

## 2013-05-02 LAB — CULTURE, RESPIRATORY W GRAM STAIN: Culture: NORMAL

## 2013-05-02 MED ORDER — ASPIRIN 81 MG PO CHEW
81.0000 mg | CHEWABLE_TABLET | Freq: Every day | ORAL | Status: DC
  Administered 2013-05-02 – 2013-05-03 (×2): 81 mg via ORAL
  Filled 2013-05-02 (×2): qty 1

## 2013-05-02 MED ORDER — LEVOFLOXACIN 500 MG PO TABS
500.0000 mg | ORAL_TABLET | Freq: Every day | ORAL | Status: AC
  Administered 2013-05-03: 10:00:00 500 mg via ORAL
  Filled 2013-05-02: qty 1

## 2013-05-02 MED ORDER — POLYETHYLENE GLYCOL 3350 17 G PO PACK
17.0000 g | PACK | Freq: Every day | ORAL | Status: DC
  Administered 2013-05-02: 17 g via ORAL
  Filled 2013-05-02 (×2): qty 1

## 2013-05-02 MED ORDER — SENNOSIDES-DOCUSATE SODIUM 8.6-50 MG PO TABS
2.0000 | ORAL_TABLET | Freq: Two times a day (BID) | ORAL | Status: DC
  Administered 2013-05-02: 2 via ORAL
  Filled 2013-05-02 (×3): qty 2

## 2013-05-02 MED ORDER — ALPRAZOLAM 0.25 MG PO TABS
0.2500 mg | ORAL_TABLET | Freq: Two times a day (BID) | ORAL | Status: DC | PRN
  Administered 2013-05-02 (×2): 0.25 mg via ORAL
  Filled 2013-05-02 (×2): qty 1

## 2013-05-02 MED ORDER — DM-GUAIFENESIN ER 30-600 MG PO TB12
1.0000 | ORAL_TABLET | Freq: Two times a day (BID) | ORAL | Status: DC
  Administered 2013-05-02 – 2013-05-03 (×3): 1 via ORAL
  Filled 2013-05-02 (×5): qty 1

## 2013-05-02 NOTE — Progress Notes (Signed)
PT Cancellation Note  Patient Details Name: Jill White MRN: [id number] DOB: 12-28-1940   Cancelled Treatment:    Reason Eval/Treat Not Completed: PT screened, no needs identified, will sign off. PT performed eval on 04/30/13 and pt did not require further PT services. PT screened pt today due to new PT order, pt reports no new needs for PT. Therefore, PT signing off.   Sol Blazing 05/02/2013, 1:19 PM

## 2013-05-02 NOTE — Progress Notes (Signed)
    Subjective:  Chest soreness with coughing; denies dyspnea   Objective:  Filed Vitals:   05/02/13 0132 05/02/13 0139 05/02/13 0550 05/02/13 0552  BP:   154/69 124/74  Pulse:   89   Temp:   98.4 F (36.9 C)   TempSrc:   Oral   Resp:   18   Height:      Weight:      SpO2: 88% 94% 95%     Intake/Output from previous day:  Intake/Output Summary (Last 24 hours) at 05/02/13 8295 Last data filed at 05/01/13 1850  Gross per 24 hour  Intake    390 ml  Output      3 ml  Net    387 ml    Physical Exam: Physical exam: Well-developed well-nourished in no acute distress.  Skin is warm and dry.  HEENT is normal.  Neck is supple.  Chest diminished BS RLL Cardiovascular exam is regular rate and rhythm. 2/6 systolic murmur LSB Abdominal exam nontender or distended. No masses palpated. Extremities show no edema. neuro grossly intact    Lab Results: Basic Metabolic Panel:  Recent Labs  62/13/08 1906 04/30/13 0100 05/01/13 0446  NA  --  126* 127*  K  --  4.1 4.0  CL  --  90* 91*  CO2  --  29 28  GLUCOSE  --  182* 142*  BUN  --  6 8  CREATININE  --  0.36* 0.43*  CALCIUM  --  8.3* 9.0  MG 1.4*  --  1.8   CBC:  Recent Labs  04/29/13 1245 04/30/13 0100 05/01/13 0446  WBC 7.5 5.2 15.1*  NEUTROABS 4.7 4.7  --   HGB 13.8 13.3 13.3  HCT 40.5 38.5 38.9  MCV 97.4 97.2 97.5  PLT 345 335 357   Cardiac Enzymes:  Recent Labs  04/29/13 1904 04/30/13 0100 04/30/13 0645  TROPONINI <0.30 <0.30 <0.30     Assessment/Plan:  1 left bundle branch block-noted on previous electrocardiograms. Echo shows normal LV fuction. Plan outpatient nuclear study to exclude coronary disease once she recovers from pneumonia. 2 murmur-echo shows mild MR. 3 pneumonia-continue antibiotics. She is not volume overloaded on examination. Can resume PRN lasix at DC (was on prior to admission). 4 COPD-continue bronchodilators. 5 hypertension-continue present medications. We will sign off;  please have patient fu with Dr Tenny Craw 4 weeks after DC for nuclear study and cardiac issues. [name] 05/02/2013, 7:28 AM

## 2013-05-02 NOTE — Progress Notes (Signed)
[facility] PROGRESS NOTE  Jill White HYQ:[id number] DOB: 03-21-1941 DOA: 04/29/2013 PCP: Nelwyn Salisbury, MD  Assessment/Plan: #1 acute respiratory failure Likely secondary to community acquired pneumonia and COPD exacerbation. Clinical improvement. Continue empiric IV Levaquin. Continue oxygen, nebulizer treatments, Mucinex. Will decrease IV Solu-Medrol to 40 mg every 12 and quickly taper. Patient looks clinically dry on examination. Follow.  #2 community acquired pneumonia Clinical improvement. Continue oxygen, nebulizer treatments, IV Levaquin.  #3 probable early acute COPD exacerbation Likely triggered by problem #2. Clinical improvement. Continue oxygen, nebulizer treatments, IV Levaquin, change IV Solu-Medrol to 40 mg IV every 12 and taper quickly. Follow.  #4 hypomagnesemia Replete as needed  #5 left bundle branch block Per cardiology patient was noted to have a left bundle branch in 2012. Cardiac enzymes are negative x3. 2-D echo is done showed  Wall thickness was increased in a pattern of moderate LVH. Systolic function was normal. The estimated ejection fraction was in the range of 60% to 65%. Wall motion was normal; there were no regional wall motion abnormalities. If left ventricular function is normal cardiology recommended outpatient nuclear study to exclude coronary disease. A left ventricular function is abnormal may likely need an inpatient study such as a cardiac catheterization. Per cardiology.  #6 hypertension suboptimal. Continue Norvasc.  #7 hyponatremia Stable. No significant change. Will saline lock IV fluids. Follow. If worsening may consider a dose of IV Lasix.  #8 tobacco abuse Tobacco cessation. Continue nicotine patch.  #9 prophylaxis PPI for GI prophylaxis. Lovenox for DVT prophylaxis.  #10 leukocytosis - probably reactive from steroids.   Code Status: Full Family Communication: Updated patient no family at bedside. Disposition Plan: Home  when medically stable.   Consultants:  Cardiology: Dr. Tenny Craw 04/29/2013  Procedures:  Chest x-ray 04/29/2013, 04/30/2013  2-D echo pending  Antibiotics:  IV Levaquin 04/29/2013  HPI/Subjective: Patient states she's feeling a whole lot better. Breathing has improved. Patient states cough is improved.  Objective: Filed Vitals:   05/02/13 0552  BP: 124/74  Pulse:   Temp:   Resp:     Intake/Output Summary (Last 24 hours) at 05/02/13 1135 Last data filed at 05/01/13 1850  Gross per 24 hour  Intake    390 ml  Output      3 ml  Net    387 ml   Filed Weights   04/29/13 1408 04/29/13 1900  Weight: 58.1 kg (128 lb 1.4 oz) 58.287 kg (128 lb 8 oz)    Exam:   General:  NAD  Cardiovascular: Regular rate rhythm no murmurs rubs or gallops.  Respiratory: Minimal wheezing.  Abdomen: Soft, nontender, nondistended, positive bowel sounds.  Musculoskeletal: No clubbing cyanosis or edema.  Data Reviewed: Basic Metabolic Panel:  Recent Labs Lab 04/29/13 1245 04/29/13 1906 04/30/13 0100 05/01/13 0446  NA 126*  --  126* 127*  K 4.2  --  4.1 4.0  CL 88*  --  90* 91*  CO2 27  --  29 28  GLUCOSE 107*  --  182* 142*  BUN 4*  --  6 8  CREATININE 0.39*  --  0.36* 0.43*  CALCIUM 9.1  --  8.3* 9.0  MG  --  1.4*  --  1.8   Liver Function Tests: No results found for this basename: AST, ALT, ALKPHOS, BILITOT, PROT, ALBUMIN,  in the last 168 hours No results found for this basename: LIPASE, AMYLASE,  in the last 168 hours No results found for this basename: AMMONIA,  in the  last 168 hours CBC:  Recent Labs Lab 04/29/13 1245 04/30/13 0100 05/01/13 0446  WBC 7.5 5.2 15.1*  NEUTROABS 4.7 4.7  --   HGB 13.8 13.3 13.3  HCT 40.5 38.5 38.9  MCV 97.4 97.2 97.5  PLT 345 335 357   Cardiac Enzymes:  Recent Labs Lab 04/29/13 1245 04/29/13 1904 04/30/13 0100 04/30/13 0645  TROPONINI <0.30 <0.30 <0.30 <0.30   BNP (last 3 results)  Recent Labs  04/29/13 1228   PROBNP 518.4*   CBG:  Recent Labs Lab 04/30/13 2315 05/01/13 0727 05/01/13 1217 05/01/13 1720 05/01/13 2101  GLUCAP 134* 123* 131* 126* 121*    Recent Results (from the past 240 hour(s))  CULTURE, BLOOD (ROUTINE X 2)     Status: None   Collection Time    04/29/13 12:40 PM      Result Value Range Status   Specimen Description BLOOD LEFT FOREARM   Final   Special Requests BOTTLES DRAWN AEROBIC ONLY   Final   Culture  Setup Time     Final   Value: 04/29/2013 15:07     Performed at [facility]   Culture     Final   Value:        BLOOD CULTURE RECEIVED NO GROWTH TO DATE CULTURE WILL BE HELD FOR 5 DAYS BEFORE ISSUING A FINAL NEGATIVE REPORT     Performed at [facility]   Report Status PENDING   Incomplete  CULTURE, BLOOD (ROUTINE X 2)     Status: None   Collection Time    04/29/13 12:45 PM      Result Value Range Status   Specimen Description BLOOD LEFT ANTECUBITAL   Final   Special Requests BOTTLES DRAWN AEROBIC ONLY   Final   Culture  Setup Time     Final   Value: 04/29/2013 15:07     Performed at [facility]   Culture     Final   Value:        BLOOD CULTURE RECEIVED NO GROWTH TO DATE CULTURE WILL BE HELD FOR 5 DAYS BEFORE ISSUING A FINAL NEGATIVE REPORT     Performed at [facility]   Report Status PENDING   Incomplete  URINE CULTURE     Status: None   Collection Time    04/29/13  4:24 PM      Result Value Range Status   Specimen Description URINE, RANDOM   Final   Special Requests NONE   Final   Culture  Setup Time     Final   Value: 04/29/2013 20:38     Performed at [facility]   Culture     Final   Value: NO GROWTH     Performed at [facility]   Report Status 04/30/2013 FINAL   Final  RESPIRATORY VIRUS PANEL     Status: None   Collection Time    04/29/13  4:49 PM      Result Value Range Status   Source - RVPAN NOSE   Final   Respiratory Syncytial Virus A NOT DETECTED   Final   Respiratory  Syncytial Virus B NOT DETECTED   Final   Influenza A NOT DETECTED   Final   Influenza B NOT DETECTED   Final   Parainfluenza 1 NOT DETECTED   Final   Parainfluenza 2 NOT DETECTED   Final   Parainfluenza 3 NOT DETECTED   Final   Metapneumovirus NOT DETECTED  Final   Rhinovirus NOT DETECTED   Final   Adenovirus NOT DETECTED   Final   Influenza A H1 NOT DETECTED   Final   Influenza A H3 NOT DETECTED   Final   Comment: (NOTE)           Normal Reference Range for each Analyte: NOT DETECTED     Testing performed using the Luminex xTAG Respiratory Viral Panel test     kit.     This test was developed and its performance characteristics determined     by [facility]. It has not been cleared or approved by the [place]     Food and Drug Administration. This test is used for clinical purposes.     It should not be regarded as investigational or for research. This     laboratory is certified under the Clinical Laboratory Improvement     Amendments of 1988 (CLIA) as qualified to perform high complexity     clinical laboratory testing.     Performed at [facility]  CULTURE, EXPECTORATED SPUTUM-ASSESSMENT     Status: None   Collection Time    04/29/13  8:50 PM      Result Value Range Status   Specimen Description SPUTUM   Final   Special Requests NONE   Final   Sputum evaluation     Final   Value: THIS SPECIMEN IS ACCEPTABLE. RESPIRATORY CULTURE REPORT TO FOLLOW.   Report Status 04/29/2013 FINAL   Final  CULTURE, RESPIRATORY (NON-EXPECTORATED)     Status: None   Collection Time    04/29/13  8:50 PM      Result Value Range Status   Specimen Description SPUTUM   Final   Special Requests NONE   Final   Gram Stain     Final   Value: RARE WBC PRESENT, PREDOMINANTLY PMN     NO SQUAMOUS EPITHELIAL CELLS SEEN     RARE GRAM POSITIVE COCCI     IN PAIRS RARE GRAM VARIABLE ROD     Performed at [facility]   Culture     Final   Value: NORMAL OROPHARYNGEAL FLORA     Performed  at [facility]   Report Status 05/02/2013 FINAL   Final     Studies: No results found.  Scheduled Meds: . albuterol  2.5 mg Nebulization Q6H  . amLODipine  5 mg Oral Daily  . aspirin  81 mg Oral Daily  . clonazePAM  1 mg Oral QHS  . dextromethorphan-guaiFENesin  1 tablet Oral BID  . enoxaparin (LOVENOX) injection  40 mg Subcutaneous Q24H  . insulin aspart  0-9 Units Subcutaneous TID WC  . ipratropium  0.5 mg Nebulization Q6H  . [START ON 05/03/2013] levofloxacin  500 mg Oral Daily  . methylPREDNISolone (SOLU-MEDROL) injection  40 mg Intravenous Q12H  . nicotine  21 mg Transdermal Daily  . pantoprazole  40 mg Oral BID AC  . polyethylene glycol  17 g Oral Daily  . senna-docusate  2 tablet Oral BID   Continuous Infusions:    Principal Problem:   Acute respiratory failure Active Problems:   HYPERTENSION   Hyponatremia   RLS (restless legs syndrome)   Tobacco abuse   Dehydration   COPD exacerbation   CAP (community acquired pneumonia)    Time spent: 20 minutes    Buryl Bamber  [facility] Pager [phone] If 7PM-7AM, please contact night-coverage at [email or web address], password [facility] 05/02/2013, 11:35 AM  LOS: 3 days

## 2013-05-02 NOTE — Progress Notes (Signed)
I have reviewed this note and agree with all findings. Kati Vasilisa Vore, PT, DPT Pager: [phone]   

## 2013-05-02 NOTE — Progress Notes (Signed)
Assumed care of patient. Received report from [place]. Agree with previous assessment. J.Joleigh Mineau, RN

## 2013-05-03 LAB — CBC
Hemoglobin: 14.5 g/dL (ref 12.0–15.0)
MCH: 33.8 pg (ref 26.0–34.0)
RBC: 4.29 MIL/uL (ref 3.87–5.11)
WBC: 10.6 10*3/uL — ABNORMAL HIGH (ref 4.0–10.5)

## 2013-05-03 LAB — BASIC METABOLIC PANEL
BUN: 10 mg/dL (ref 6–23)
CO2: 28 mEq/L (ref 19–32)
Calcium: 9 mg/dL (ref 8.4–10.5)
Chloride: 91 mEq/L — ABNORMAL LOW (ref 96–112)
Glucose, Bld: 142 mg/dL — ABNORMAL HIGH (ref 70–99)
Sodium: 128 mEq/L — ABNORMAL LOW (ref 135–145)

## 2013-05-03 LAB — GLUCOSE, CAPILLARY: Glucose-Capillary: 106 mg/dL — ABNORMAL HIGH (ref 70–99)

## 2013-05-03 MED ORDER — PANTOPRAZOLE SODIUM 40 MG PO TBEC: 40.0000 mg | DELAYED_RELEASE_TABLET | Freq: Two times a day (BID) | ORAL | Status: DC

## 2013-05-03 MED ORDER — IPRATROPIUM-ALBUTEROL 18-103 MCG/ACT IN AERO: 2.0000 | INHALATION_SPRAY | RESPIRATORY_TRACT | Status: DC | PRN

## 2013-05-03 MED ORDER — METHYLPREDNISOLONE (PAK) 4 MG PO TABS: ORAL_TABLET | ORAL | Status: DC

## 2013-05-03 MED ORDER — METHYLPREDNISOLONE SODIUM SUCC 40 MG IJ SOLR: 40.0000 mg | INTRAMUSCULAR | Status: DC

## 2013-05-03 MED ORDER — DM-GUAIFENESIN ER 30-600 MG PO TB12: 1.0000 | ORAL_TABLET | Freq: Two times a day (BID) | ORAL | Status: DC

## 2013-05-04 NOTE — Discharge Summary (Addendum)
Physician Discharge Summary  Jill White AVW:[id number] DOB: 17-Nov-1940 DOA: 04/29/2013  PCP: Nelwyn Salisbury, MD  Admit date: 04/29/2013 Discharge date: 05/03/2013  Time spent: 35 minutes  Recommendations for Outpatient Follow-up:  1. Follow up with PCP in one week 2. Follow up with CXR in one to two weeks.  3. Follow up with Dr Tenny Craw in 4 weeks.   Discharge Diagnoses:  Principal Problem:   Acute respiratory failure Active Problems:   HYPERTENSION   Hyponatremia   RLS (restless legs syndrome)   Tobacco abuse   Dehydration   COPD exacerbation   CAP (community acquired pneumonia)   Discharge Condition: improved.   Diet recommendation: low sodium diet  Filed Weights   04/29/13 1408 04/29/13 1900  Weight: 58.1 kg (128 lb 1.4 oz) 58.287 kg (128 lb 8 oz)    History of present illness:   Jill White is a 72 y.o. female  past medical history of hypertension, COPD with continued tobacco abuse, history of peptic ulcer disease, hypertension who presents to the ED with a proximally a week history of productive cough of greenish yellowish sputum with some bloody streaks, fever with temperatures has on a 1.8, wheezing, chills, diaphoresis, chest soreness secondary to coughing, shortness of breath, nausea. Patient denies any emesis, no abdominal pain, no constipation, no diarrhea, no weakness, no dysuria. Patient denies any recent weight gain. Patient states she has been taking her brother back in 4 to [facility] over the past month. Patient to PCP on the day of admission was given some duo nebs in the office however had minimal response patient was subsequently sent to the ED. In the emergency room chest x-ray which was done was worrisome for right lower lobe pneumonia some pulmonary interstitial edema with some superimposed pneumonitis or underlying pulmonary interstitial fibrosis noted. ABG had a pH of 7.44 PCO2 45 PO2 54 bicarbonate of 31 basic metabolic profile with a sodium  of 126 chloride of 88 BUN of 4 creatinine of 0.39 with a glucose of 107. Versed of troponins was negative. Pro BNP was 518.4. CBC was unremarkable. Urinalysis is pending.  Patient was given some nebulizer treatments in the ED as well as a dose of IV Levaquin and we were consulted to admit the patient for further evaluation and management.  Hospital Course:   #1 acute respiratory failure  Likely secondary to community acquired pneumonia and COPD exacerbation. Clinical improvement. Patient looks clinically dry on examination. She is on RA with good sats. She refused nebulizers on discharge, . She completed the course of antibiotics. She is discharged on a quick taper of medrol dose pack with protonix to protect the gastric mucosa. She is recommended to follow upwith a CXR in 1 to 2 weeks to evaluate for resolution of the pneumonia.  #2 community acquired pneumonia  Clinical improvement. Continue oxygen, nebulizer treatments,  She completed the course of levaquin.  #3 probable early acute COPD exacerbation  Likely triggered by problem #2. Clinical improvement. Discharged on quick taper of steroids.  #4 hypomagnesemia  Repleted as needed  #5 left bundle branch block  Per cardiology patient was noted to have a left bundle branch in 2012. Cardiac enzymes are negative x3. 2-D echo is done showed Wall thickness was increased in a pattern of moderate LVH. Systolic function was normal. The estimated ejection fraction was in the range of 60% to 65%. Wall motion was normal; there were no regional wall motion abnormalities. If left ventricular function is normal cardiology  recommended outpatient nuclear study to exclude coronary disease. A left ventricular function is abnormal may likely need an inpatient study such as a cardiac catheterization. Outpatient follow up with Dr Tenny Craw, in 4 weeks .  #6 hypertension  suboptimal. Continue Norvasc and losartan on discharge.  #7 hyponatremia  Stable. No significant  change. Chronic, and asymptomatic. .  #8 tobacco abuse  Tobacco cessation. Refused nicotine patch.  #10 leukocytosis  - probably reactive from steroids.      Procedures: Chest x-ray 04/29/2013,  04/30/2013 2-D echo : Wall thickness was increased in a pattern of moderate LVH. Systolic function was normal. The estimated ejection fraction was in the range of 60% to 65%. Wall motion was normal; there were no regional wall motion abnormalities. There was an increased relative contribution of atrial contraction to ventricular filling.      Consultations:  cardiology  Discharge Exam: Filed Vitals:   05/03/13 1022  BP: 146/68  Pulse:   Temp:   Resp:     General: NAD  Cardiovascular: Regular rate rhythm no murmurs rubs or gallops . Respiratory: no wheezing heard. Good air entry.  Abdomen: Soft, nontender, nondistended, positive bowel sounds . Musculoskeletal: No clubbing cyanosis or edema.  Discharge Instructions  Discharge Orders   Future Orders Complete By Expires   Diet - low sodium heart healthy  As directed    Discharge instructions  As directed    Comments:     Follow up with PCP in one week and please get referral to a pulmonologist for Pulmonary function tests. Follow upwith Dr Tenny Craw in 4 weeks.       Medication List    STOP taking these medications       furosemide 20 MG tablet  Commonly known as:  LASIX     guaiFENesin 600 MG 12 hr tablet  Commonly known as:  MUCINEX     ondansetron 4 MG tablet  Commonly known as:  ZOFRAN      TAKE these medications       albuterol-ipratropium 18-103 MCG/ACT inhaler  Commonly known as:  COMBIVENT  Inhale 2 puffs into the lungs every 4 (four) hours as needed for wheezing.     amLODipine 5 MG tablet  Commonly known as:  NORVASC  Take one tablet by mouth one time daily     aspirin 81 MG tablet  Take 81 mg by mouth daily.     calcium-vitamin D 500-200 MG-UNIT per tablet  Commonly known as:  OSCAL WITH D   Take 1 tablet by mouth daily.     clonazePAM 0.5 MG tablet  Commonly known as:  KLONOPIN  Take 2 tablets (1 mg total) by mouth at bedtime.     dextromethorphan-guaiFENesin 30-600 MG per 12 hr tablet  Commonly known as:  MUCINEX DM  Take 1 tablet by mouth 2 (two) times daily.     losartan 50 MG tablet  Commonly known as:  COZAAR  Take 50 mg by mouth daily.     methylPREDNIsolone 4 MG tablet  Commonly known as:  MEDROL DOSPACK  - Take 8 tablets of methylprednisolone 4 mg for 1 day followed by  - TAke 4 tablets of methylprednisolone 4 mg for 1 days followed by   - Take 2 tablets of  methylprednisolone 4 mg for 1 day followed by   - Take 1 tablets of  Methylprednisolone 4 mg for 1 day and stop.     pantoprazole 40 MG tablet  Commonly known as:  PROTONIX  Take 1 tablet (40 mg total) by mouth 2 (two) times daily before a meal.     vitamin E 100 UNIT capsule  Take 100 Units by mouth daily.       Allergies  Allergen Reactions  . Tetanus Toxoid Anaphylaxis  . Codeine Nausea And Vomiting and Other (See Comments)    Itching and faint   . Erythromycin Nausea And Vomiting  . Meloxicam Itching  . Penicillins Hives  . Prednisone Other (See Comments)    GI bleed       Follow-up Information   Follow up with FRY,STEPHEN A, MD. Schedule an appointment as soon as possible for a visit in 1 week.   Specialty:  Family Medicine   Contact information:   [address] [place] [address] [phone]       Follow up with Dietrich Pates, MD. Schedule an appointment as soon as possible for a visit in 4 weeks.   Specialty:  Cardiology   Contact information:   [address] [place] [address] [phone]        The results of significant diagnostics from this hospitalization (including imaging, microbiology, ancillary and laboratory) are listed below for reference.    Significant Diagnostic Studies: Dg Chest Port 1 View  04/30/2013   CLINICAL  DATA:  Cough, fever  EXAM: PORTABLE CHEST - 1 VIEW  COMPARISON:  04/29/2013; 06/15/2012; chest CT - 08/10/2006  FINDINGS: Grossly unchanged cardiac silhouette and mediastinal contours given patient rotation. Atherosclerotic calcifications within the thoracic aorta. Worsening cephalization of flow and right mid/ lower lung heterogeneous airspace opacities. Trace right-sided effusion is not excluded. Skin fold overlies the right upper lung. No pneumothorax. Unchanged bones.  IMPRESSION: Findings suggestive of worsening pulmonary edema and right mid/lower lung heterogeneous opacities, asymmetric alveolar pulmonary edema versus developing infection. A follow-up chest radiograph in 4 to 6 weeks after treatment is recommended to ensure resolution.   Electronically Signed   By: Simonne Come M.D.   On: 04/30/2013 07:56   Dg Chest Port 1 View  04/29/2013   CLINICAL DATA:  Cough and congestion.  EXAM: PORTABLE CHEST - 1 VIEW  COMPARISON:  06/15/2012 .  FINDINGS: Mediastinum and hilar structures are normal. Cardiomegaly with pulmonary vascular prominence and interstitial prominence noted suggesting CHF with pulmonary interstitial edema. Interstitial disease may also be related to active interstitial pneumonitis or interstitial fibrosis.  Ill-defined infiltrate right lower lobe is present. This may represent pulmonary edema or pneumonia. Mild pleural effusions noted bilaterally . No pneumothorax. Degenerative changes both shoulders and thoracic spine.  IMPRESSION: 1. Findings suggesting congestive heart failure with pulmonary interstitial edema. Superimposed pneumonitis or underlying pulmonary interstitial fibrosis may be present. 2. Right lower lobe infiltrate noted suggesting pneumonia. Small pleural effusions cannot be excluded.   Electronically Signed   By: Maisie Fus  Register   On: 04/29/2013 12:21    Microbiology: Recent Results (from the past 240 hour(s))  CULTURE, BLOOD (ROUTINE X 2)     Status: None   Collection  Time    04/29/13 12:40 PM      Result Value Range Status   Specimen Description BLOOD LEFT FOREARM   Final   Special Requests BOTTLES DRAWN AEROBIC ONLY   Final   Culture  Setup Time     Final   Value: 04/29/2013 15:07     Performed at [facility]   Culture     Final   Value:  BLOOD CULTURE RECEIVED NO GROWTH TO DATE CULTURE WILL BE HELD FOR 5 DAYS BEFORE ISSUING A FINAL NEGATIVE REPORT     Performed at [facility]   Report Status PENDING   Incomplete  CULTURE, BLOOD (ROUTINE X 2)     Status: None   Collection Time    04/29/13 12:45 PM      Result Value Range Status   Specimen Description BLOOD LEFT ANTECUBITAL   Final   Special Requests BOTTLES DRAWN AEROBIC ONLY   Final   Culture  Setup Time     Final   Value: 04/29/2013 15:07     Performed at [facility]   Culture     Final   Value:        BLOOD CULTURE RECEIVED NO GROWTH TO DATE CULTURE WILL BE HELD FOR 5 DAYS BEFORE ISSUING A FINAL NEGATIVE REPORT     Performed at [facility]   Report Status PENDING   Incomplete  URINE CULTURE     Status: None   Collection Time    04/29/13  4:24 PM      Result Value Range Status   Specimen Description URINE, RANDOM   Final   Special Requests NONE   Final   Culture  Setup Time     Final   Value: 04/29/2013 20:38     Performed at [facility]   Culture     Final   Value: NO GROWTH     Performed at [facility]   Report Status 04/30/2013 FINAL   Final  RESPIRATORY VIRUS PANEL     Status: None   Collection Time    04/29/13  4:49 PM      Result Value Range Status   Source - RVPAN NOSE   Final   Respiratory Syncytial Virus A NOT DETECTED   Final   Respiratory Syncytial Virus B NOT DETECTED   Final   Influenza A NOT DETECTED   Final   Influenza B NOT DETECTED   Final   Parainfluenza 1 NOT DETECTED   Final   Parainfluenza 2 NOT DETECTED   Final   Parainfluenza 3 NOT DETECTED   Final   Metapneumovirus NOT DETECTED   Final    Rhinovirus NOT DETECTED   Final   Adenovirus NOT DETECTED   Final   Influenza A H1 NOT DETECTED   Final   Influenza A H3 NOT DETECTED   Final   Comment: (NOTE)           Normal Reference Range for each Analyte: NOT DETECTED     Testing performed using the Luminex xTAG Respiratory Viral Panel test     kit.     This test was developed and its performance characteristics determined     by [facility]. It has not been cleared or approved by the [place]     Food and Drug Administration. This test is used for clinical purposes.     It should not be regarded as investigational or for research. This     laboratory is certified under the Clinical Laboratory Improvement     Amendments of 1988 (CLIA) as qualified to perform high complexity     clinical laboratory testing.     Performed at [facility]  CULTURE, EXPECTORATED SPUTUM-ASSESSMENT     Status: None   Collection Time    04/29/13  8:50 PM      Result Value Range Status   Specimen  Description SPUTUM   Final   Special Requests NONE   Final   Sputum evaluation     Final   Value: THIS SPECIMEN IS ACCEPTABLE. RESPIRATORY CULTURE REPORT TO FOLLOW.   Report Status 04/29/2013 FINAL   Final  CULTURE, RESPIRATORY (NON-EXPECTORATED)     Status: None   Collection Time    04/29/13  8:50 PM      Result Value Range Status   Specimen Description SPUTUM   Final   Special Requests NONE   Final   Gram Stain     Final   Value: RARE WBC PRESENT, PREDOMINANTLY PMN     NO SQUAMOUS EPITHELIAL CELLS SEEN     RARE GRAM POSITIVE COCCI     IN PAIRS RARE GRAM VARIABLE ROD     Performed at [facility]   Culture     Final   Value: NORMAL OROPHARYNGEAL FLORA     Performed at [facility]   Report Status 05/02/2013 FINAL   Final     Labs: Basic Metabolic Panel:  Recent Labs Lab 04/29/13 1245 04/29/13 1906 04/30/13 0100 05/01/13 0446 05/02/13 1138 05/03/13 0420  NA 126*  --  126* 127* 127* 128*  K 4.2  --  4.1 4.0  3.7 4.3  CL 88*  --  90* 91* 89* 91*  CO2 27  --  29 28 28 28   GLUCOSE 107*  --  182* 142* 117* 142*  BUN 4*  --  6 8 10 10   CREATININE 0.39*  --  0.36* 0.43* 0.51 0.49*  CALCIUM 9.1  --  8.3* 9.0 9.5 9.0  MG  --  1.4*  --  1.8  --   --    Liver Function Tests: No results found for this basename: AST, ALT, ALKPHOS, BILITOT, PROT, ALBUMIN,  in the last 168 hours No results found for this basename: LIPASE, AMYLASE,  in the last 168 hours No results found for this basename: AMMONIA,  in the last 168 hours CBC:  Recent Labs Lab 04/29/13 1245 04/30/13 0100 05/01/13 0446 05/03/13 0420  WBC 7.5 5.2 15.1* 10.6*  NEUTROABS 4.7 4.7  --   --   HGB 13.8 13.3 13.3 14.5  HCT 40.5 38.5 38.9 41.6  MCV 97.4 97.2 97.5 97.0  PLT 345 335 357 345   Cardiac Enzymes:  Recent Labs Lab 04/29/13 1245 04/29/13 1904 04/30/13 0100 04/30/13 0645  TROPONINI <0.30 <0.30 <0.30 <0.30   BNP: BNP (last 3 results)  Recent Labs  04/29/13 1228  PROBNP 518.4*   CBG:  Recent Labs Lab 05/02/13 1204 05/02/13 1710 05/02/13 2037 05/03/13 0722 05/03/13 1230  GLUCAP 96 162* 111* 106* 119*       Signed:  Lakeyshia Tuckerman  [facility] 05/03/2013, 10:57 AM

## 2013-05-05 LAB — CULTURE, BLOOD (ROUTINE X 2)
Culture: NO GROWTH
Culture: NO GROWTH

## 2013-06-04 ENCOUNTER — Ambulatory Visit: Payer: Medicare Other | Admitting: Family Medicine

## 2013-06-24 ENCOUNTER — Ambulatory Visit (INDEPENDENT_AMBULATORY_CARE_PROVIDER_SITE_OTHER)
Admission: RE | Admit: 2013-06-24 | Discharge: 2013-06-24 | Disposition: A | Discharge status: Home / Self Care | Payer: Medicare Other | Source: Ambulatory Visit | Attending: Family Medicine | Admitting: Family Medicine

## 2013-06-24 ENCOUNTER — Ambulatory Visit (INDEPENDENT_AMBULATORY_CARE_PROVIDER_SITE_OTHER): Payer: Medicare Other | Admitting: Family Medicine

## 2013-06-24 ENCOUNTER — Encounter: Payer: Self-pay | Admitting: Family Medicine

## 2013-06-24 VITALS — BP 168/90 | HR 80 | Temp 98.8°F | Wt 134.0 lb

## 2013-06-24 DIAGNOSIS — I10 Essential (primary) hypertension: Secondary | ICD-10-CM

## 2013-06-24 DIAGNOSIS — J4489 Other specified chronic obstructive pulmonary disease: Secondary | ICD-10-CM

## 2013-06-24 DIAGNOSIS — J449 Chronic obstructive pulmonary disease, unspecified: Secondary | ICD-10-CM

## 2013-06-24 DIAGNOSIS — J189 Pneumonia, unspecified organism: Secondary | ICD-10-CM

## 2013-06-24 MED ORDER — LOSARTAN POTASSIUM-HCTZ 100-25 MG PO TABS: 1.0000 | ORAL_TABLET | Freq: Every day | ORAL | Status: DC

## 2013-06-24 MED ORDER — ALBUTEROL SULFATE HFA 108 (90 BASE) MCG/ACT IN AERS: 2.0000 | INHALATION_SPRAY | RESPIRATORY_TRACT | Status: DC | PRN

## 2013-06-24 NOTE — Progress Notes (Signed)
   Subjective:    Patient ID: Jill White, female    DOB: Nov 18, 1940, 73 y.o.   MRN: [id number]  HPI Here to follow up a hospital stay from 04-29-13 to 05-03-13 for pneumonia and COPD exacerbation. She was treated with Levaquin and recovered quickly. She had an ECHO revealing an EF of 60-65% and a calcified mitral valve with some mild regurgitation. Since going home she has felt fine with only an occasional dry cough and some SOB. Her amlodipine was stopped but she continues on Cozaar.    Review of Systems  Constitutional: Negative.   Respiratory: Positive for cough and shortness of breath. Negative for chest tightness and wheezing.   Cardiovascular: Negative.        Objective:   Physical Exam  Constitutional: She appears well-developed and well-nourished.  Cardiovascular: Normal rate, regular rhythm and intact distal pulses.  Exam reveals no gallop and no friction rub.   Soft 2/6 systolic and diastolic murmurs over the mitral area   Pulmonary/Chest: Effort normal. No respiratory distress. She has no wheezes. She has no rales.  Decreased BS throughout           Assessment & Plan:  Her pneumonia seems to have resolved and her COPD is stable. Get another CXR today. Add a Ventolin inhaler to use prn. Change to Hyzaar 100/25 daily. Recheck in one month

## 2013-06-24 NOTE — Progress Notes (Signed)
Pre visit review using our clinic review tool, if applicable. No additional management support is needed unless otherwise documented below in the visit note. 

## 2013-07-02 ENCOUNTER — Telehealth: Payer: Self-pay | Admitting: Family Medicine

## 2013-07-02 NOTE — Telephone Encounter (Signed)
Pt states her bp med has worked good, the swelling has gone down. No problems at all EXCEPT, but she is having leg cramps. Is there anything else pt can do??

## 2013-07-03 MED ORDER — POTASSIUM CHLORIDE ER 10 MEQ PO TBCR: 10.0000 meq | EXTENDED_RELEASE_TABLET | Freq: Two times a day (BID) | ORAL | Status: DC

## 2013-07-03 NOTE — Telephone Encounter (Signed)
Her potassium is probably low from the diuretic in her meds. Call in Klor-con 10 mEq to take this BID, #60 with 11 rf. See me in one month

## 2013-07-03 NOTE — Telephone Encounter (Signed)
I sent script e-scribe and spoke with pt. 

## 2013-07-22 ENCOUNTER — Ambulatory Visit: Payer: Medicare Other | Admitting: Family Medicine

## 2013-07-22 ENCOUNTER — Telehealth: Payer: Self-pay | Admitting: Family Medicine

## 2013-07-22 NOTE — Telephone Encounter (Signed)
Relevant patient education mailed to patient.  

## 2013-07-23 ENCOUNTER — Encounter: Payer: Self-pay | Admitting: Family Medicine

## 2013-07-23 ENCOUNTER — Ambulatory Visit (INDEPENDENT_AMBULATORY_CARE_PROVIDER_SITE_OTHER): Payer: Medicare Other | Admitting: Family Medicine

## 2013-07-23 VITALS — BP 150/80 | Temp 98.5°F | Ht 63.0 in | Wt 130.0 lb

## 2013-07-23 DIAGNOSIS — J449 Chronic obstructive pulmonary disease, unspecified: Secondary | ICD-10-CM

## 2013-07-23 DIAGNOSIS — I10 Essential (primary) hypertension: Secondary | ICD-10-CM

## 2013-07-23 MED ORDER — LOSARTAN POTASSIUM-HCTZ 50-12.5 MG PO TABS: 1.0000 | ORAL_TABLET | Freq: Every day | ORAL | Status: DC

## 2013-07-23 MED ORDER — CLONAZEPAM 0.5 MG PO TABS: 1.0000 mg | ORAL_TABLET | Freq: Every day | ORAL | Status: DC

## 2013-07-23 NOTE — Progress Notes (Signed)
Pre visit review using our clinic review tool, if applicable. No additional management support is needed unless otherwise documented below in the visit note. 

## 2013-07-23 NOTE — Progress Notes (Signed)
   Subjective:    Patient ID: Jill White, female    DOB: 02-28-1941, 73 y.o.   MRN: [id number]  HPI Here to recheck her BP. She has been on Hyzaar for a month. Her BP is better but she says it makes her feel very tired and a little depressed.    Review of Systems  Constitutional: Positive for fatigue.  Respiratory: Negative.   Cardiovascular: Negative.        Objective:   Physical Exam  Constitutional: She appears well-developed and well-nourished.  Cardiovascular: Normal rate, regular rhythm, normal heart sounds and intact distal pulses.   Pulmonary/Chest: Effort normal and breath sounds normal.          Assessment & Plan:  We will decrease the Hyzaar to 50-12.5 daily. Recheck in one month

## 2013-07-24 ENCOUNTER — Telehealth: Payer: Self-pay | Admitting: Family Medicine

## 2013-07-24 NOTE — Telephone Encounter (Signed)
Relevant patient education mailed to patient.  

## 2013-07-25 ENCOUNTER — Other Ambulatory Visit: Payer: Self-pay | Admitting: Family Medicine

## 2013-08-05 ENCOUNTER — Telehealth: Payer: Self-pay | Admitting: Family Medicine

## 2013-08-05 NOTE — Telephone Encounter (Signed)
Pt grand daughter is calling back checking on status of med. [place] daughter is aware waiting on md

## 2013-08-05 NOTE — Telephone Encounter (Signed)
Patient Information:  Caller Name: Janett Billow  Phone: [phone]  Patient: Jill White, Jill White  Gender: Female  DOB: 02-20-1941  Age: 73 Years  PCP: Alysia Penna [facility])  Office Follow Up:  Does the office need to follow up with this patient?: Yes  Instructions For The Office: Requests Rx without office visit.  Please call back.   RN Note:  At times,passes only gas after stomach cramps.  Declines appointment; she does not want to come to office.  Requests Dr Sarajane Jews order medication for abdominal pain that comes and goes regardless of  passing a stool.  Symptoms  Reason For Call & Symptoms: Mild diarrhea with stomach cramps.  Asking for medication to use for stomach cramps.  Reviewed Health History In EMR: Yes  Reviewed Medications In EMR: Yes  Reviewed Allergies In EMR: Yes  Reviewed Surgeries / Procedures: Yes  Date of Onset of Symptoms: 08/02/2013  Treatments Tried: Mylanta, Gatorade, ORS, Water  Treatments Tried Worked: No  Guideline(s) Used:  Diarrhea  Disposition Per Guideline:   See Today in Office  Reason For Disposition Reached:   Abdominal pain (Exception: pain clears completely with each passage of diarrhea stool)  Advice Given:  Reassurance:  In healthy adults, new-onset diarrhea is usually caused by a viral infection of the intestines, which you can treat at home. Diarrhea is the body's way of getting rid of the infection. Here are some tips on how to keep ahead of the fluid losses.  Fluids:  Drink more fluids, at least 8-10 glasses (8 oz or 240 ml) daily.  Supplement this with saltine crackers or soups to make certain that you are getting sufficient fluid and salt to meet your body's needs.  Avoid caffeinated beverages (Reason: caffeine is mildly dehydrating).  Nutrition:  Maintaining some food intake during episodes of diarrhea is important.  Ideal initial foods include boiled starches/cereals (e.g., potatoes, rice, noodles, wheat, oats) with a small amount  of salt to taste.  Other acceptable foods include: bananas, yogurt, crackers, soup.  As your stools return to normal consistency, resume a normal diet.  Expected Course:  Viral diarrhea lasts 4-7 days. Always worse on days 1 and 2.  Call Back If:  Signs of dehydration occur (e.g., no urine for more than 12 hours, very dry mouth, lightheaded, etc.)  Diarrhea lasts over 7 days  You become worse.  Patient Refused Recommendation:  Patient Requests Prescription  Please call back to advise if MD will order Rx for stomach pain/cramps without appointment.

## 2013-08-05 NOTE — Telephone Encounter (Signed)
Pt following up on request for stomach med for cramps. pls advise [employer]/ [employer]

## 2013-08-07 NOTE — Telephone Encounter (Signed)
Noted. We cannot treat her over the phone.

## 2013-08-07 NOTE — Telephone Encounter (Signed)
If she is still having abdominal cramps, she needs an OV to evaluate

## 2013-08-07 NOTE — Telephone Encounter (Signed)
Pt's grand daughter called back and I advised her of Dr.Fry's reply.  Pt's grand daughter said pt will not come into the office for an appointment.

## 2013-08-09 ENCOUNTER — Encounter: Payer: Self-pay | Admitting: Family Medicine

## 2013-08-09 ENCOUNTER — Ambulatory Visit (INDEPENDENT_AMBULATORY_CARE_PROVIDER_SITE_OTHER): Payer: Medicare Other | Admitting: Family Medicine

## 2013-08-09 VITALS — BP 150/90 | Temp 98.7°F | Wt 130.0 lb

## 2013-08-09 DIAGNOSIS — K5732 Diverticulitis of large intestine without perforation or abscess without bleeding: Secondary | ICD-10-CM

## 2013-08-09 HISTORY — DX: Diverticulitis of large intestine without perforation or abscess without bleeding: K57.32

## 2013-08-09 MED ORDER — CIPROFLOXACIN HCL 500 MG PO TABS: 500.0000 mg | ORAL_TABLET | Freq: Two times a day (BID) | ORAL | Status: DC

## 2013-08-09 MED ORDER — METRONIDAZOLE 500 MG PO TABS
500.0000 mg | ORAL_TABLET | Freq: Three times a day (TID) | ORAL | Status: DC
Start: 2013-08-09 — End: 2013-08-23

## 2013-08-09 NOTE — Progress Notes (Signed)
Pre visit review using our clinic review tool, if applicable. No additional management support is needed unless otherwise documented below in the visit note. 

## 2013-08-09 NOTE — Progress Notes (Signed)
   Subjective:    Patient ID: Jill White, female    DOB: 1940-07-16, 73 y.o.   MRN: [id number]  HPI Here for 5 days of pain in the LLQ which she feels certain is diverticulitis. No fever. She had what sounds like a GI virus last week with some cramps and diarrhea but this went away. Then 2 days later her current pains started. She has nausea but has not vomited. No urinary sx. Drinking water and Gatorade. Her last BM was this am, and it was soft but not loose.    Review of Systems  Constitutional: Negative.   Gastrointestinal: Positive for nausea, abdominal pain and diarrhea. Negative for vomiting, constipation, blood in stool, abdominal distention and anal bleeding.  Genitourinary: Negative.        Objective:   Physical Exam  Constitutional: She appears well-developed and well-nourished. No distress.  Pulmonary/Chest: Effort normal and breath sounds normal.  Abdominal: Soft. Bowel sounds are normal. She exhibits no distension and no mass. There is no rebound and no guarding.  Tender in the LLQ           Assessment & Plan:  Treat with Flagyl and Cipro. Recheck if the pain gets worse or if she gets a fever.

## 2013-08-12 ENCOUNTER — Telehealth: Payer: Self-pay | Admitting: Family Medicine

## 2013-08-12 NOTE — Telephone Encounter (Signed)
Relevant patient education mailed to patient.  

## 2013-08-22 ENCOUNTER — Telehealth: Payer: Self-pay | Admitting: Family Medicine

## 2013-08-22 NOTE — Telephone Encounter (Signed)
Pt prefers to wait until tomorrow to see Dr Sarajane Jews.  Daughter states pt is better today. Since Dr Sarajane Jews said he would see tomorrow, I had to use a SD appt at 10:30. I hope that was ok??

## 2013-08-23 ENCOUNTER — Ambulatory Visit (INDEPENDENT_AMBULATORY_CARE_PROVIDER_SITE_OTHER): Payer: Medicare Other | Admitting: Family Medicine

## 2013-08-23 ENCOUNTER — Encounter: Payer: Self-pay | Admitting: Family Medicine

## 2013-08-23 VITALS — BP 152/90 | HR 82 | Temp 102.9°F | Wt 128.0 lb

## 2013-08-23 DIAGNOSIS — J209 Acute bronchitis, unspecified: Secondary | ICD-10-CM

## 2013-08-23 MED ORDER — FLUCONAZOLE 150 MG PO TABS: 150.0000 mg | ORAL_TABLET | Freq: Once | ORAL | Status: DC

## 2013-08-23 MED ORDER — LEVOFLOXACIN 500 MG PO TABS: 500.0000 mg | ORAL_TABLET | Freq: Every day | ORAL | Status: AC

## 2013-08-23 NOTE — Progress Notes (Signed)
   Subjective:    Patient ID: Jill White, female    DOB: 1940/07/10, 73 y.o.   MRN: [id number]  HPI Here for 2 days of fever, aches and coughing up yellow sputum. Her diverticulitis seems to have resolved.    Review of Systems  Constitutional: Positive for fever, chills and activity change.  HENT: Positive for congestion.   Eyes: Negative.   Respiratory: Positive for cough.        Objective:   Physical Exam  Constitutional: She appears well-developed and well-nourished.  HENT:  Right Ear: External ear normal.  Left Ear: External ear normal.  Nose: Nose normal.  Mouth/Throat: Oropharynx is clear and moist.  Eyes: Conjunctivae are normal.  Pulmonary/Chest: Effort normal. No respiratory distress. She has no wheezes. She has no rales.  Scattered rhonchi   Lymphadenopathy:    She has no cervical adenopathy.          Assessment & Plan:  Use Tylenol for fever and drink fluids.

## 2013-08-30 ENCOUNTER — Encounter: Payer: Self-pay | Admitting: Family Medicine

## 2013-08-30 ENCOUNTER — Ambulatory Visit (INDEPENDENT_AMBULATORY_CARE_PROVIDER_SITE_OTHER): Payer: Medicare Other | Admitting: Family Medicine

## 2013-08-30 VITALS — BP 150/80 | Temp 98.7°F | Ht 63.0 in

## 2013-08-30 DIAGNOSIS — M109 Gout, unspecified: Secondary | ICD-10-CM

## 2013-08-30 MED ORDER — TRAMADOL HCL 50 MG PO TABS: 100.0000 mg | ORAL_TABLET | Freq: Four times a day (QID) | ORAL | Status: DC | PRN

## 2013-08-30 MED ORDER — METHYLPREDNISOLONE ACETATE 80 MG/ML IJ SUSP
160.0000 mg | Freq: Once | INTRAMUSCULAR | Status: AC
  Administered 2013-08-30: 160 mg via INTRAMUSCULAR

## 2013-08-30 NOTE — Progress Notes (Signed)
   Subjective:    Patient ID: Jill White, female    DOB: 06/19/1941, 73 y.o.   MRN: [id number]  HPI Here for 3 days of swelling and pain in the left ankle. No recent trauma. No SOB.    Review of Systems  Constitutional: Negative.   Respiratory: Negative.   Cardiovascular: Negative.   Musculoskeletal: Positive for arthralgias and joint swelling.       Objective:   Physical Exam  Constitutional: She appears well-developed and well-nourished.  Cardiovascular: Normal rate, regular rhythm, normal heart sounds and intact distal pulses.   Pulmonary/Chest: Effort normal and breath sounds normal.  Musculoskeletal:  Left ankle is pink, warm, swollen, and very tender           Assessment & Plan:  Gout. Given a steroid shot. Use tramadol for pain. Elevate foot. If not better by next week she will call us.

## 2013-08-30 NOTE — Progress Notes (Signed)
Pre visit review using our clinic review tool, if applicable. No additional management support is needed unless otherwise documented below in the visit note. 

## 2013-08-30 NOTE — Addendum Note (Signed)
Addended by: Aggie Hacker A on: 08/30/2013 03:21 PM   Modules accepted: Orders

## 2013-09-02 ENCOUNTER — Telehealth: Payer: Self-pay | Admitting: Family Medicine

## 2013-09-02 MED ORDER — METHYLPREDNISOLONE 4 MG PO KIT: PACK | ORAL | Status: DC

## 2013-09-02 NOTE — Telephone Encounter (Signed)
Call in a Medrol Dose pack. It is okay to take this along with pain meds

## 2013-09-02 NOTE — Telephone Encounter (Signed)
I spoke with pt and she will follow up with [place] as needed.

## 2013-09-02 NOTE — Telephone Encounter (Signed)
Pt was seen on Friday and dr. Sarajane Jews informed her to call first thing this mourning if she was not better. Granddaughter states she is not better. Dr. Sarajane Jews stated he would get her a steroid, pt is requesting the steroid, and also wants to know if she will be able to take the steroid and the pain medicine. Pt also will need something to protect while taking the steroids. [facility] to [facility] on [address]. grandaughter would like a call back

## 2013-09-02 NOTE — Telephone Encounter (Signed)
I sent script e-scribe and spoke with grandaughter. Pt needs something like Protonix to take only while on the steriod. Also pt is having more swelling from ankle up to her knee now.

## 2013-09-02 NOTE — Telephone Encounter (Signed)
Relevant patient education mailed to patient.  

## 2013-09-02 NOTE — Telephone Encounter (Signed)
Use Prilosec OTC for a few days

## 2013-09-06 ENCOUNTER — Encounter: Payer: Self-pay | Admitting: Family Medicine

## 2013-09-06 ENCOUNTER — Telehealth: Payer: Self-pay | Admitting: Family Medicine

## 2013-09-06 ENCOUNTER — Ambulatory Visit (INDEPENDENT_AMBULATORY_CARE_PROVIDER_SITE_OTHER): Payer: Medicare Other | Admitting: Family Medicine

## 2013-09-06 VITALS — BP 124/80 | Temp 97.8°F | Wt 129.0 lb

## 2013-09-06 DIAGNOSIS — M25579 Pain in unspecified ankle and joints of unspecified foot: Secondary | ICD-10-CM

## 2013-09-06 MED ORDER — INDOMETHACIN 50 MG PO CAPS: 50.0000 mg | ORAL_CAPSULE | Freq: Three times a day (TID) | ORAL | Status: DC | PRN

## 2013-09-06 NOTE — Progress Notes (Signed)
   Subjective:    Patient ID: Jill White, female    DOB: [birth year], 73 y.o.   MRN: [id number]  HPI Here to follow up left ankle pain. She took Tramadol for a few days and this helped but she had to stop it due to hives and itching. Then she tried a steroid dose pack and this also caused hives. She stopped this as well and the hives resolved. Now her ankle feels better but is still painful at times.    Review of Systems  Constitutional: Negative.   Musculoskeletal: Positive for arthralgias and joint swelling.       Objective:   Physical Exam  Constitutional: She appears well-nourished.  Musculoskeletal:  The ankle looks better, less swollen, not warm or red           Assessment & Plan:  Ankle pain. Try Indomethacin prn. Refer to Orthopedics.

## 2013-09-06 NOTE — Progress Notes (Signed)
Pre visit review using our clinic review tool, if applicable. No additional management support is needed unless otherwise documented below in the visit note. 

## 2013-09-06 NOTE — Telephone Encounter (Signed)
Patient Information:  Caller Name: Janett Billow  Phone: [phone]  Patient: Jill White, Jill White  Gender: Female  DOB: 06/11/1941  Age: 73 Years  PCP: Alysia Penna [facility])  Office Follow Up:  Does the office need to follow up with this patient?: No  Instructions For The Office: N/A  RN Note:  Granddaughter/[name] states patient was seen in the office 08/30/13 for right ankle swelling. States patient was diagnosed with gout and prescribed Tramadol and given a steroid injection 08/30/13. States she called office 09/02/13 due to no improvement and prescribed a steroid pack. States patient did not tolerate the steroid pack, due to nausea and itching, and discontinued the steroid pack 09/04/13. No hives noted. Granddaughter states patient continues to have pain and swelling in right ankle. No redness, red streaks or fever. States swelling improves with elevation but recurs when up. Denies injury. Patient is ambulating with a walker. Care advice given per guidelines. Advised rest, elevation. Call back parameters reviewed. Caller verbalizes udnerstanding.  Symptoms  Reason For Call & Symptoms: Right ankle swelling, redness and pain. Diagnosed with Goit 08/30/13.  Reviewed Health History In EMR: Yes  Reviewed Medications In EMR: Yes  Reviewed Allergies In EMR: Yes  Reviewed Surgeries / Procedures: Yes  Date of Onset of Symptoms: 08/28/2013  Treatments Tried: Prednisone, Tramadol  Treatments Tried Worked: No  Guideline(s) Used:  Ankle Pain  Disposition Per Guideline:   See Within 3 Days in Office  Reason For Disposition Reached:   Moderate pain (e.g., interferes with normal activities, limping) and present > 3 days  Advice Given:  Call Back If:  You become worse.  Patient Will Follow Care Advice:  YES  Appointment Scheduled:  09/06/2013 14:15:00 Appointment Scheduled Provider:  Alysia Penna [facility])

## 2013-09-09 ENCOUNTER — Telehealth: Payer: Self-pay | Admitting: Family Medicine

## 2013-09-09 NOTE — Telephone Encounter (Signed)
Relevant patient education mailed to patient.  

## 2013-09-11 ENCOUNTER — Encounter: Payer: Self-pay | Admitting: Family Medicine

## 2013-09-12 MED ORDER — FUROSEMIDE 20 MG PO TABS: 20.0000 mg | ORAL_TABLET | Freq: Every day | ORAL | Status: DC

## 2013-09-12 NOTE — Telephone Encounter (Signed)
Duplicate

## 2013-09-12 NOTE — Telephone Encounter (Signed)
Call in Lasix 20 mg daily, #30 with 2 rf.

## 2013-09-12 NOTE — Telephone Encounter (Signed)
I spoke with pt and sent script e-scribe to [facility].

## 2013-09-12 NOTE — Telephone Encounter (Signed)
Pt called to assure you got the [employer]. Pt does not want the referral at this time. Would like you sylvia to give her a call.

## 2013-09-12 NOTE — Telephone Encounter (Signed)
I spoke with pt and she already has appointment with ortho on September 18, 2013. Also her right ankle is swollen now and would like for you to advise on this?

## 2013-09-19 ENCOUNTER — Telehealth: Payer: Self-pay

## 2013-09-24 ENCOUNTER — Encounter: Payer: Self-pay | Admitting: Family Medicine

## 2013-09-25 NOTE — Telephone Encounter (Signed)
Error // ck °

## 2013-10-02 NOTE — Telephone Encounter (Signed)
I don't think it will help but we can try doubling up on the Lasix. Try taking 2 a day for a week

## 2013-11-22 ENCOUNTER — Ambulatory Visit (INDEPENDENT_AMBULATORY_CARE_PROVIDER_SITE_OTHER): Payer: Medicare Other | Admitting: Family Medicine

## 2013-11-22 ENCOUNTER — Encounter (HOSPITAL_COMMUNITY): Payer: Self-pay | Admitting: Emergency Medicine

## 2013-11-22 ENCOUNTER — Inpatient Hospital Stay (HOSPITAL_COMMUNITY)
Admission: EM | Admit: 2013-11-22 | Discharge: 2013-12-03 | DRG: 871 | Disposition: A | Discharge status: Home Health | Payer: Medicare Other | Attending: Pulmonary Disease | Admitting: Pulmonary Disease

## 2013-11-22 ENCOUNTER — Emergency Department (HOSPITAL_COMMUNITY): Payer: Medicare Other

## 2013-11-22 ENCOUNTER — Encounter: Payer: Self-pay | Admitting: Family Medicine

## 2013-11-22 ENCOUNTER — Inpatient Hospital Stay (HOSPITAL_COMMUNITY): Payer: Medicare Other

## 2013-11-22 VITALS — BP 143/92 | HR 98 | Temp 99.5°F | Ht 63.0 in | Wt 123.0 lb

## 2013-11-22 DIAGNOSIS — I4891 Unspecified atrial fibrillation: Secondary | ICD-10-CM | POA: Diagnosis present

## 2013-11-22 DIAGNOSIS — G9340 Encephalopathy, unspecified: Secondary | ICD-10-CM | POA: Diagnosis present

## 2013-11-22 DIAGNOSIS — J189 Pneumonia, unspecified organism: Secondary | ICD-10-CM

## 2013-11-22 DIAGNOSIS — I5043 Acute on chronic combined systolic (congestive) and diastolic (congestive) heart failure: Secondary | ICD-10-CM

## 2013-11-22 DIAGNOSIS — I359 Nonrheumatic aortic valve disorder, unspecified: Secondary | ICD-10-CM | POA: Diagnosis present

## 2013-11-22 DIAGNOSIS — Z66 Do not resuscitate: Secondary | ICD-10-CM | POA: Diagnosis present

## 2013-11-22 DIAGNOSIS — T380X5A Adverse effect of glucocorticoids and synthetic analogues, initial encounter: Secondary | ICD-10-CM | POA: Diagnosis present

## 2013-11-22 DIAGNOSIS — R6520 Severe sepsis without septic shock: Secondary | ICD-10-CM

## 2013-11-22 DIAGNOSIS — E873 Alkalosis: Secondary | ICD-10-CM | POA: Diagnosis present

## 2013-11-22 DIAGNOSIS — I2510 Atherosclerotic heart disease of native coronary artery without angina pectoris: Secondary | ICD-10-CM | POA: Diagnosis present

## 2013-11-22 DIAGNOSIS — Z720 Tobacco use: Secondary | ICD-10-CM

## 2013-11-22 DIAGNOSIS — Z7982 Long term (current) use of aspirin: Secondary | ICD-10-CM

## 2013-11-22 DIAGNOSIS — Z8711 Personal history of peptic ulcer disease: Secondary | ICD-10-CM

## 2013-11-22 DIAGNOSIS — I498 Other specified cardiac arrhythmias: Secondary | ICD-10-CM | POA: Diagnosis present

## 2013-11-22 DIAGNOSIS — F411 Generalized anxiety disorder: Secondary | ICD-10-CM | POA: Diagnosis present

## 2013-11-22 DIAGNOSIS — I2789 Other specified pulmonary heart diseases: Secondary | ICD-10-CM | POA: Diagnosis present

## 2013-11-22 DIAGNOSIS — R7309 Other abnormal glucose: Secondary | ICD-10-CM | POA: Diagnosis present

## 2013-11-22 DIAGNOSIS — J9600 Acute respiratory failure, unspecified whether with hypoxia or hypercapnia: Secondary | ICD-10-CM

## 2013-11-22 DIAGNOSIS — J449 Chronic obstructive pulmonary disease, unspecified: Secondary | ICD-10-CM

## 2013-11-22 DIAGNOSIS — E871 Hypo-osmolality and hyponatremia: Secondary | ICD-10-CM

## 2013-11-22 DIAGNOSIS — J441 Chronic obstructive pulmonary disease with (acute) exacerbation: Secondary | ICD-10-CM

## 2013-11-22 DIAGNOSIS — F17210 Nicotine dependence, cigarettes, uncomplicated: Secondary | ICD-10-CM | POA: Diagnosis present

## 2013-11-22 DIAGNOSIS — F17200 Nicotine dependence, unspecified, uncomplicated: Secondary | ICD-10-CM | POA: Diagnosis present

## 2013-11-22 DIAGNOSIS — I447 Left bundle-branch block, unspecified: Secondary | ICD-10-CM | POA: Diagnosis present

## 2013-11-22 DIAGNOSIS — M25559 Pain in unspecified hip: Secondary | ICD-10-CM

## 2013-11-22 DIAGNOSIS — Z9849 Cataract extraction status, unspecified eye: Secondary | ICD-10-CM

## 2013-11-22 DIAGNOSIS — G2581 Restless legs syndrome: Secondary | ICD-10-CM

## 2013-11-22 DIAGNOSIS — IMO0002 Reserved for concepts with insufficient information to code with codable children: Secondary | ICD-10-CM

## 2013-11-22 DIAGNOSIS — E860 Dehydration: Secondary | ICD-10-CM

## 2013-11-22 DIAGNOSIS — R6521 Severe sepsis with septic shock: Secondary | ICD-10-CM

## 2013-11-22 DIAGNOSIS — J122 Parainfluenza virus pneumonia: Secondary | ICD-10-CM

## 2013-11-22 DIAGNOSIS — A419 Sepsis, unspecified organism: Secondary | ICD-10-CM

## 2013-11-22 DIAGNOSIS — Z8249 Family history of ischemic heart disease and other diseases of the circulatory system: Secondary | ICD-10-CM

## 2013-11-22 DIAGNOSIS — K5732 Diverticulitis of large intestine without perforation or abscess without bleeding: Secondary | ICD-10-CM

## 2013-11-22 DIAGNOSIS — R197 Diarrhea, unspecified: Secondary | ICD-10-CM

## 2013-11-22 DIAGNOSIS — I10 Essential (primary) hypertension: Secondary | ICD-10-CM

## 2013-11-22 DIAGNOSIS — E872 Acidosis, unspecified: Secondary | ICD-10-CM | POA: Diagnosis present

## 2013-11-22 DIAGNOSIS — D72829 Elevated white blood cell count, unspecified: Secondary | ICD-10-CM

## 2013-11-22 DIAGNOSIS — I350 Nonrheumatic aortic (valve) stenosis: Secondary | ICD-10-CM

## 2013-11-22 DIAGNOSIS — E876 Hypokalemia: Secondary | ICD-10-CM | POA: Diagnosis present

## 2013-11-22 DIAGNOSIS — Z79899 Other long term (current) drug therapy: Secondary | ICD-10-CM

## 2013-11-22 HISTORY — DX: Peptic ulcer, site unspecified, unspecified as acute or chronic, without hemorrhage or perforation: K27.9

## 2013-11-22 HISTORY — DX: Hypomagnesemia: E83.42

## 2013-11-22 HISTORY — DX: Nonrheumatic mitral (valve) insufficiency: I34.0

## 2013-11-22 HISTORY — DX: Left bundle-branch block, unspecified: I44.7

## 2013-11-22 HISTORY — DX: Pulmonary hypertension, unspecified: I27.20

## 2013-11-22 HISTORY — DX: Hypo-osmolality and hyponatremia: E87.1

## 2013-11-22 LAB — CBC WITH DIFFERENTIAL/PLATELET
BASOS PCT: 0 % (ref 0–1)
Basophils Absolute: 0.1 10*3/uL (ref 0.0–0.1)
EOS ABS: 0 10*3/uL (ref 0.0–0.7)
Eosinophils Relative: 0 % (ref 0–5)
HEMATOCRIT: 41.6 % (ref 36.0–46.0)
HEMOGLOBIN: 14.4 g/dL (ref 12.0–15.0)
Lymphocytes Relative: 19 % (ref 12–46)
Lymphs Abs: 2.5 10*3/uL (ref 0.7–4.0)
MCH: 32.8 pg (ref 26.0–34.0)
MCHC: 34.6 g/dL (ref 30.0–36.0)
MCV: 94.8 fL (ref 78.0–100.0)
MONO ABS: 1 10*3/uL (ref 0.1–1.0)
MONOS PCT: 7 % (ref 3–12)
Neutro Abs: 9.7 10*3/uL — ABNORMAL HIGH (ref 1.7–7.7)
Neutrophils Relative %: 74 % (ref 43–77)
Platelets: 303 10*3/uL (ref 150–400)
RBC: 4.39 MIL/uL (ref 3.87–5.11)
RDW: 14.6 % (ref 11.5–15.5)
WBC: 13.2 10*3/uL — ABNORMAL HIGH (ref 4.0–10.5)

## 2013-11-22 LAB — COMPREHENSIVE METABOLIC PANEL
ALK PHOS: 85 U/L (ref 39–117)
ALT: 19 U/L (ref 0–35)
AST: 28 U/L (ref 0–37)
Albumin: 3.8 g/dL (ref 3.5–5.2)
BILIRUBIN TOTAL: 0.4 mg/dL (ref 0.3–1.2)
BUN: 6 mg/dL (ref 6–23)
CO2: 32 mEq/L (ref 19–32)
Calcium: 9 mg/dL (ref 8.4–10.5)
Chloride: 84 mEq/L — ABNORMAL LOW (ref 96–112)
Creatinine, Ser: 0.39 mg/dL — ABNORMAL LOW (ref 0.50–1.10)
GFR calc non Af Amer: >90 mL/min (ref >90)
GLUCOSE: 102 mg/dL — AB (ref 70–99)
POTASSIUM: 4.2 meq/L (ref 3.7–5.3)
Sodium: 128 mEq/L — ABNORMAL LOW (ref 137–147)
TOTAL PROTEIN: 7.5 g/dL (ref 6.0–8.3)

## 2013-11-22 LAB — URINALYSIS, ROUTINE W REFLEX MICROSCOPIC
BILIRUBIN URINE: NEGATIVE
Glucose, UA: NEGATIVE mg/dL
Hgb urine dipstick: NEGATIVE
KETONES UR: NEGATIVE mg/dL
Nitrite: NEGATIVE
Protein, ur: NEGATIVE mg/dL
Specific Gravity, Urine: 1.014 (ref 1.005–1.030)
UROBILINOGEN UA: 0.2 mg/dL (ref 0.0–1.0)
pH: 7.5 (ref 5.0–8.0)

## 2013-11-22 LAB — URINE MICROSCOPIC-ADD ON

## 2013-11-22 LAB — PRO B NATRIURETIC PEPTIDE: PRO B NATRI PEPTIDE: 4145 pg/mL — AB (ref 0–450)

## 2013-11-22 LAB — BLOOD GAS, ARTERIAL
ACID-BASE EXCESS: 3.1 mmol/L — AB (ref 0.0–2.0)
Bicarbonate: 28.5 mEq/L — ABNORMAL HIGH (ref 20.0–24.0)
Drawn by: 307971
O2 Content: 2 L/min
O2 Saturation: 91.5 %
PATIENT TEMPERATURE: 98.6
TCO2: 25.4 mmol/L (ref 0–100)
pCO2 arterial: 49 mmHg — ABNORMAL HIGH (ref 35.0–45.0)
pH, Arterial: 7.382 (ref 7.350–7.450)
pO2, Arterial: 62.7 mmHg — ABNORMAL LOW (ref 80.0–100.0)

## 2013-11-22 LAB — I-STAT CG4 LACTIC ACID, ED: Lactic Acid, Venous: 1.51 mmol/L (ref 0.5–2.2)

## 2013-11-22 LAB — MRSA PCR SCREENING: MRSA by PCR: NEGATIVE

## 2013-11-22 LAB — STREP PNEUMONIAE URINARY ANTIGEN: Strep Pneumo Urinary Antigen: NEGATIVE

## 2013-11-22 LAB — D-DIMER, QUANTITATIVE (NOT AT ARMC): D DIMER QUANT: 0.51 ug{FEU}/mL — AB (ref 0.00–0.48)

## 2013-11-22 MED ORDER — VANCOMYCIN HCL 500 MG IV SOLR
500.0000 mg | Freq: Two times a day (BID) | INTRAVENOUS | Status: DC
  Administered 2013-11-23 – 2013-11-24 (×5): 500 mg via INTRAVENOUS
  Filled 2013-11-22 (×6): qty 500

## 2013-11-22 MED ORDER — FUROSEMIDE 20 MG PO TABS: 20.0000 mg | ORAL_TABLET | Freq: Every morning | ORAL | Status: DC

## 2013-11-22 MED ORDER — HYDROCHLOROTHIAZIDE 12.5 MG PO CAPS: 12.5000 mg | ORAL_CAPSULE | Freq: Every day | ORAL | Status: DC

## 2013-11-22 MED ORDER — GUAIFENESIN-DM 100-10 MG/5ML PO SYRP: 5.0000 mL | ORAL_SOLUTION | ORAL | Status: DC | PRN

## 2013-11-22 MED ORDER — POTASSIUM CHLORIDE ER 10 MEQ PO TBCR
10.0000 meq | EXTENDED_RELEASE_TABLET | Freq: Two times a day (BID) | ORAL | Status: DC
  Administered 2013-11-22: 10 meq via ORAL
  Filled 2013-11-22: qty 1

## 2013-11-22 MED ORDER — LOSARTAN POTASSIUM 50 MG PO TABS
50.0000 mg | ORAL_TABLET | Freq: Every day | ORAL | Status: DC
  Administered 2013-11-22: 50 mg via ORAL
  Filled 2013-11-22: qty 1

## 2013-11-22 MED ORDER — TRAMADOL HCL 50 MG PO TABS: 50.0000 mg | ORAL_TABLET | Freq: Four times a day (QID) | ORAL | Status: DC | PRN

## 2013-11-22 MED ORDER — PANTOPRAZOLE SODIUM 40 MG PO TBEC
40.0000 mg | DELAYED_RELEASE_TABLET | Freq: Every day | ORAL | Status: DC
Start: 2013-11-22 — End: 2013-11-23

## 2013-11-22 MED ORDER — LORAZEPAM 1 MG PO TABS
1.0000 mg | ORAL_TABLET | Freq: Three times a day (TID) | ORAL | Status: DC | PRN
  Administered 2013-11-22 – 2013-11-25 (×3): 1 mg via ORAL
  Filled 2013-11-22 (×5): qty 1

## 2013-11-22 MED ORDER — LORAZEPAM 2 MG/ML IJ SOLN
0.5000 mg | Freq: Once | INTRAMUSCULAR | Status: AC
  Administered 2013-11-22: 0.5 mg via INTRAVENOUS
  Filled 2013-11-22: qty 1

## 2013-11-22 MED ORDER — ONDANSETRON HCL 4 MG/2ML IJ SOLN
4.0000 mg | Freq: Four times a day (QID) | INTRAMUSCULAR | Status: DC | PRN
Start: 2013-11-22 — End: 2013-12-03
  Administered 2013-11-22: 4 mg via INTRAVENOUS
  Filled 2013-11-22: qty 2

## 2013-11-22 MED ORDER — IPRATROPIUM-ALBUTEROL 0.5-2.5 (3) MG/3ML IN SOLN
3.0000 mL | Freq: Once | RESPIRATORY_TRACT | Status: AC
  Administered 2013-11-22: 3 mL via RESPIRATORY_TRACT
  Filled 2013-11-22: qty 3

## 2013-11-22 MED ORDER — LORAZEPAM 2 MG/ML IJ SOLN
1.0000 mg | Freq: Once | INTRAMUSCULAR | Status: AC
  Administered 2013-11-22: 1 mg via INTRAVENOUS
  Filled 2013-11-22: qty 1

## 2013-11-22 MED ORDER — LOSARTAN POTASSIUM-HCTZ 50-12.5 MG PO TABS: 1.0000 | ORAL_TABLET | Freq: Every morning | ORAL | Status: DC

## 2013-11-22 MED ORDER — VITAMIN E 45 MG (100 UNIT) PO CAPS: 100.0000 [IU] | ORAL_CAPSULE | Freq: Every morning | ORAL | Status: DC

## 2013-11-22 MED ORDER — LEVOFLOXACIN IN D5W 750 MG/150ML IV SOLN: 750.0000 mg | INTRAVENOUS | Status: DC

## 2013-11-22 MED ORDER — ENOXAPARIN SODIUM 40 MG/0.4ML ~~LOC~~ SOLN
40.0000 mg | SUBCUTANEOUS | Status: DC
  Administered 2013-11-22 – 2013-12-02 (×11): 40 mg via SUBCUTANEOUS
  Filled 2013-11-22 (×12): qty 0.4

## 2013-11-22 MED ORDER — NICOTINE 21 MG/24HR TD PT24
21.0000 mg | MEDICATED_PATCH | Freq: Every day | TRANSDERMAL | Status: DC
  Administered 2013-11-22 – 2013-11-25 (×4): 21 mg via TRANSDERMAL
  Filled 2013-11-22 (×4): qty 1

## 2013-11-22 MED ORDER — ASPIRIN EC 81 MG PO TBEC: 81.0000 mg | DELAYED_RELEASE_TABLET | Freq: Every morning | ORAL | Status: DC

## 2013-11-22 MED ORDER — GUAIFENESIN ER 600 MG PO TB12
1200.0000 mg | ORAL_TABLET | Freq: Two times a day (BID) | ORAL | Status: DC
  Administered 2013-11-22 – 2013-11-25 (×4): 1200 mg via ORAL
  Filled 2013-11-22 (×4): qty 2

## 2013-11-22 MED ORDER — ALBUTEROL (5 MG/ML) CONTINUOUS INHALATION SOLN
10.0000 mg/h | INHALATION_SOLUTION | Freq: Once | RESPIRATORY_TRACT | Status: AC
  Administered 2013-11-22: 10 mg/h via RESPIRATORY_TRACT
  Filled 2013-11-22: qty 20

## 2013-11-22 MED ORDER — SODIUM CHLORIDE 0.9 % IV BOLUS (SEPSIS)
1000.0000 mL | Freq: Once | INTRAVENOUS | Status: AC
  Administered 2013-11-22: 1000 mL via INTRAVENOUS

## 2013-11-22 MED ORDER — ACETAMINOPHEN 325 MG PO TABS: 650.0000 mg | ORAL_TABLET | ORAL | Status: DC | PRN

## 2013-11-22 MED ORDER — CLONAZEPAM 1 MG PO TABS
1.0000 mg | ORAL_TABLET | Freq: Every day | ORAL | Status: DC
  Administered 2013-11-22 – 2013-12-02 (×10): 1 mg via ORAL
  Filled 2013-11-22 (×10): qty 1

## 2013-11-22 MED ORDER — BUDESONIDE 0.25 MG/2ML IN SUSP
0.2500 mg | Freq: Two times a day (BID) | RESPIRATORY_TRACT | Status: DC
  Administered 2013-11-22 – 2013-11-25 (×6): 0.25 mg via RESPIRATORY_TRACT
  Filled 2013-11-22 (×10): qty 2

## 2013-11-22 MED ORDER — DIPHENHYDRAMINE HCL 25 MG PO CAPS: 25.0000 mg | ORAL_CAPSULE | Freq: Four times a day (QID) | ORAL | Status: DC | PRN

## 2013-11-22 MED ORDER — ALBUTEROL SULFATE (2.5 MG/3ML) 0.083% IN NEBU: 2.5000 mg | INHALATION_SOLUTION | RESPIRATORY_TRACT | Status: DC | PRN

## 2013-11-22 MED ORDER — BENZONATATE 100 MG PO CAPS
100.0000 mg | ORAL_CAPSULE | Freq: Three times a day (TID) | ORAL | Status: DC
  Administered 2013-11-22 (×2): 100 mg via ORAL
  Filled 2013-11-22 (×2): qty 1

## 2013-11-22 MED ORDER — IPRATROPIUM-ALBUTEROL 0.5-2.5 (3) MG/3ML IN SOLN
3.0000 mL | RESPIRATORY_TRACT | Status: DC
  Administered 2013-11-22 (×3): 3 mL via RESPIRATORY_TRACT
  Filled 2013-11-22 (×3): qty 3

## 2013-11-22 MED ORDER — IOHEXOL 350 MG/ML SOLN
100.0000 mL | Freq: Once | INTRAVENOUS | Status: AC | PRN
  Administered 2013-11-22: 100 mL via INTRAVENOUS

## 2013-11-22 MED ORDER — VANCOMYCIN HCL IN DEXTROSE 1-5 GM/200ML-% IV SOLN
1000.0000 mg | Freq: Once | INTRAVENOUS | Status: AC
  Administered 2013-11-22: 1000 mg via INTRAVENOUS
  Filled 2013-11-22: qty 200

## 2013-11-22 MED ORDER — LEVOFLOXACIN IN D5W 750 MG/150ML IV SOLN
750.0000 mg | Freq: Once | INTRAVENOUS | Status: AC
  Administered 2013-11-22: 750 mg via INTRAVENOUS
  Filled 2013-11-22: qty 150

## 2013-11-22 NOTE — Progress Notes (Signed)
UR completed 

## 2013-11-22 NOTE — Progress Notes (Signed)
ANTIBIOTIC CONSULT NOTE - INITIAL  Pharmacy Consult for vancomycin and levofloxacin Indication: pneumonia  Allergies  Allergen Reactions  . Tetanus Toxoid Anaphylaxis  . Codeine Nausea And Vomiting and Other (See Comments)    Itching and faint   . Erythromycin Nausea And Vomiting  . Meloxicam Itching  . Methylprednisolone Hives  . Penicillins Hives  . Prednisone Other (See Comments)    GI bleed    Patient Measurements: Weight: 55.8kg Adjusted Body Weight: 52.4kg  Vital Signs: Temp: 99.2 F (37.3 C) (06/05 1046) Temp src: Oral (06/05 1046) BP: 155/89 mmHg (06/05 1040) Pulse Rate: 111 (06/05 1040)  Labs:  Recent Labs  11/22/13 1145  WBC 13.2*  HGB 14.4  PLT 303  CREATININE 0.39*   Medical History: Past Medical History  Diagnosis Date  . HIP PAIN 03/06/2007  . HYPERTENSION 02/09/2007  . Restless leg syndrome   . COPD (chronic obstructive pulmonary disease)   . Complication of anesthesia   . Ulcer     gastric antrum     Medications:  Scheduled:  . albuterol  10 mg/hr Nebulization Once   Infusions:  . levofloxacin (LEVAQUIN) IV    . vancomycin     Assessment: 73 yoF admitted 6/5 from PCP office with hypoxia, dry and productive cough, and subjective fevers. Pt with hx COPD. Pharmacy has been consulted to dose levofloxacin and vancomycin for suspected pneumonia.  Antiinfectives 6/5 >> levofloxacin >> 6/5 >> vancomycin >>    Labs / vitals Tmax: 99.5 WBCs: 13.2 Renal: SCr 0.39 (baseline 0.4-0.5), CrCl 53 ml/min CG, 69 ml/min normalized  Lactic acid: 1.51 (6/5)  Microbiology 6/5 blood x2: sent   Goal of Therapy:  Vancomycin trough level 15-20 mcg/ml levofloxacin per indication and renal function  Plan:  - agree with vancomycin 1g IV x1 as a loading dose - start vancomycin 500mg  IV q12h at midnight - agree with levofloxacin 750mg  IV x1 - start levofloxacin 750mg  IV q24h on 6/6 at 1000 - vancomycin trough at steady state if indicated -  follow-up clinical course, culture results, renal function - follow-up antibiotic de-escalation and length of therapy  Thank you for the consult.  Johny Drilling, PharmD, BCPS Pager: [phone] Pharmacy: [phone] 11/22/2013 12:54 PM

## 2013-11-22 NOTE — H&P (Signed)
History and Physical       Hospital Admission Note Date: 11/22/2013  Patient name: Jill White Medical record number: [id number] Date of birth: August 19, 1940 Age: 73 y.o. Gender: female  PCP: Laurey Morale, MD    Chief Complaint:  Shortness of breath for last 5 weeks  HPI: Patient is 73 year old female with history of COPD, nicotine abuse, hypertension presented to the ER from her PCPs office with shortness of breath, wheezing. Patient states that for the last 4 or 5 weeks she was having chest congestion with productive cough. Over the last 2-3 days she was noticing shortness of breath, fever of 101 last night with chills. Patient went to her PCP today and her oxygen saturation was 73%. Patient presented to the ER for further workup. She has albuterol inhaler at home and reported that it was not helping with her shortness of breath and wheezing.  Review of Systems:  Constitutional: Denies fever, chills, diaphoresis, poor appetite and fatigue.  HEENT: Denies photophobia, eye pain, redness, hearing loss, ear pain, +congestion, no sore throat, rhinorrhea, sneezing, mouth sores, trouble swallowing, neck pain, neck stiffness and tinnitus.   Respiratory: Please see history of present illness   Cardiovascular: Denies chest pain, palpitations and leg swelling.  Gastrointestinal: Denies nausea, vomiting, abdominal pain, diarrhea, constipation, blood in stool and abdominal distention.  Genitourinary: Denies dysuria, urgency, frequency, hematuria, flank pain and difficulty urinating.  Musculoskeletal: Denies myalgias, back pain, joint swelling, arthralgias and gait problem.  Skin: Denies pallor, rash and wound.  Neurological: Denies dizziness, seizures, syncope, weakness, light-headedness, numbness and headaches.  Hematological: Denies adenopathy. Easy bruising, personal or family bleeding history  Psychiatric/Behavioral: Denies suicidal ideation,  mood changes, confusion, nervousness, sleep disturbance and agitation  Past Medical History: Past Medical History  Diagnosis Date  . HIP PAIN 03/06/2007  . HYPERTENSION 02/09/2007  . Restless leg syndrome   . COPD (chronic obstructive pulmonary disease)   . Complication of anesthesia   . Ulcer     gastric antrum    Past Surgical History  Procedure Laterality Date  . Abdominal hysterectomy    . Dilation and curettage of uterus    . Tonsillectomy    . Esophagogastroduodenoscopy  07/09/2011    Procedure: ESOPHAGOGASTRODUODENOSCOPY (EGD);  Surgeon: Missy Sabins, MD;  Location: [facility];  Service: Endoscopy;  Laterality: N/A;  patient in room 1532  . Cataract extraction w/ intraocular lens  implant, bilateral Bilateral 03/2013    Patient claims it was within the month.     Medications: Prior to Admission medications   Medication Sig Start Date End Date Taking? Authorizing Provider  albuterol (PROVENTIL HFA;VENTOLIN HFA) 108 (90 BASE) MCG/ACT inhaler Inhale 2 puffs into the lungs every 4 (four) hours as needed for wheezing or shortness of breath. 06/24/13  Yes Laurey Morale, MD  aspirin 81 MG tablet Take 81 mg by mouth every morning.    Yes Historical Provider, MD  clonazePAM (KLONOPIN) 0.5 MG tablet Take 1 mg by mouth at bedtime.   Yes Historical Provider, MD  dextromethorphan-guaiFENesin (MUCINEX DM) 30-600 MG per 12 hr tablet Take 1 tablet by mouth 2 (two) times daily.   Yes Historical Provider, MD  furosemide (LASIX) 20 MG tablet Take 20 mg by mouth every morning.   Yes Historical Provider, MD  losartan-hydrochlorothiazide (HYZAAR) 50-12.5 MG per tablet Take 1 tablet by mouth every morning.   Yes Historical Provider, MD  ondansetron (ZOFRAN) 4 MG tablet Take 4 mg by mouth every 4 (four) hours as  needed for nausea or vomiting.   Yes Historical Provider, MD  potassium chloride (K-DUR) 10 MEQ tablet Take 10 mEq by mouth 2 (two) times daily.   Yes Historical Provider, MD  vitamin E 100  UNIT capsule Take 100 Units by mouth every morning.    Yes Historical Provider, MD    Allergies:   Allergies  Allergen Reactions  . Tetanus Toxoid Anaphylaxis  . Codeine Nausea And Vomiting and Other (See Comments)    Itching and faint   . Erythromycin Nausea And Vomiting  . Meloxicam Itching  . Methylprednisolone Hives  . Penicillins Hives  . Prednisone Other (See Comments)    GI bleed    Social History:  reports that she has been smoking Cigarettes.  She has been smoking about 0.00 packs per day. She has never used smokeless tobacco. She reports that she drinks about .5 ounces of alcohol per week. She reports that she does not use illicit drugs.  Family History: Family History  Problem Relation Age of Onset  . Heart disease Sister   . Heart disease Brother   . Dementia Mother   . Anemia Father     Physical Exam: Blood pressure 155/89, pulse 111, temperature 99.2 F (37.3 C), temperature source Oral, resp. rate 20, SpO2 91.00%. General: Alert, awake, oriented x3, in no acute distress, somewhat anxious. HEENT: normocephalic, atraumatic, anicteric sclera, pink conjunctiva, pupils equal and reactive to light and accomodation, oropharynx clear Neck: supple, no masses or lymphadenopathy, no goiter, no bruits  Heart: Tachycardia, Regular rate and rhythm, without murmurs, rubs or gallops. Lungs: Very tight with wheezing, decreased breath sounds throughout Abdomen: Soft, nontender, nondistended, positive bowel sounds, no masses. Extremities: No clubbing, cyanosis or edema with positive pedal pulses. Neuro: Grossly intact, no focal neurological deficits, strength 5/5 upper and lower extremities bilaterally Psych: alert and oriented x 3, normal mood and affect Skin: no rashes or lesions, warm and dry   LABS on Admission:  Basic Metabolic Panel:  Recent Labs Lab 11/22/13 1145  NA 128*  K 4.2  CL 84*  CO2 32  GLUCOSE 102*  BUN 6  CREATININE 0.39*  CALCIUM 9.0   Liver  Function Tests:  Recent Labs Lab 11/22/13 1145  AST 28  ALT 19  ALKPHOS 85  BILITOT 0.4  PROT 7.5  ALBUMIN 3.8   No results found for this basename: LIPASE, AMYLASE,  in the last 168 hours No results found for this basename: AMMONIA,  in the last 168 hours CBC:  Recent Labs Lab 11/22/13 1145  WBC 13.2*  NEUTROABS 9.7*  HGB 14.4  HCT 41.6  MCV 94.8  PLT 303   Cardiac Enzymes: No results found for this basename: CKTOTAL, CKMB, CKMBINDEX, TROPONINI,  in the last 168 hours BNP: No components found with this basename: POCBNP,  CBG: No results found for this basename: GLUCAP,  in the last 168 hours   Radiological Exams on Admission: Dg Chest 2 View  11/22/2013   CLINICAL DATA:  Productive cough. Shortness of breath. COPD. Hypertension.  EXAM: CHEST  2 VIEW  COMPARISON:  06/24/2013  FINDINGS: Changes of COPD are again seen. No evidence of acute infiltrate or pulmonary edema. No evidence of pleural effusion.  Mild cardiomegaly is stable. No evidence of congestive heart failure. No mass or lymphadenopathy identified. No significant change compared to prior exam.  IMPRESSION: Stable COPD and mild cardiomegaly.  No active lung disease.   Electronically Signed   By: Sharrie Rothman.D.  On: 11/22/2013 11:35    Assessment/Plan Principal Problem:   Acute respiratory failure secondary to acute COPD exacerbation, community acquired pneumonia (clinically diagnosed), chest x-ray however negative for pneumonia - Patient will be admitted to step down unit, O2 sats have improved with 2 L O2 via nasal cannula - Placed on IV levofloxacin, scheduled bronchodilators and as needed albuterol, Pulmicort, Mucinex, O2 support, incentive spirometry - Patient is allergic to Solu-Medrol and prednisone - Will check BNP, d-dimer   Active Problems:   HYPERTENSION - Will continue Losartan/HCTZ, Lasix    Tobacco abuse -Patient strongly counseled to quit smoking, place on nicotine patch    anxiety  -  Placed on an as needed Ativan   DVT prophylaxis:  Lovenox   CODE STATUS:  I discussed in detail with the patient, she does not want any intubation or resuscitation., Will place on DNR/DNI status   Family Communication: Admission, patients condition and plan of care including tests being ordered have been discussed with the patient who indicates understanding and agree with the plan and Code Status   Further plan will depend as patient's clinical course evolves and further radiologic and laboratory data become available.   Time Spent on Admission: 1 hour  Ripudeep Krystal Eaton M.D. [facility] 11/22/2013, 2:24 PM Pager: [phone]  If 7PM-7AM, please contact night-coverage [email or web address] Password [email or web address]  **Disclaimer: This note was dictated with voice recognition software. Similar sounding words can inadvertently be transcribed and this note may contain transcription errors which may not have been corrected upon publication of note.**

## 2013-11-22 NOTE — ED Notes (Signed)
Dr. Goldston bedside.  

## 2013-11-22 NOTE — Progress Notes (Signed)
   Subjective:    Patient ID: Jill White, female    DOB: 1941-03-22, 73 y.o.   MRN: [id number]  HPI Here for 5 weeks of coughing up yellow sputum and now 4 days of fever and worsening SOB. She has COPD and a hx of frequent URIs. She is taking fluids well but her appetite is poor. No chest pains. Her feet have been swelling more this week.    Review of Systems  Constitutional: Positive for fever and fatigue.  HENT: Negative.   Eyes: Negative.   Respiratory: Positive for cough, chest tightness, shortness of breath and wheezing.   Cardiovascular: Positive for leg swelling. Negative for chest pain and palpitations.       Objective:   Physical Exam  Constitutional:  Frail, weak but alert, very SOB   HENT:  Right Ear: External ear normal.  Left Ear: External ear normal.  Nose: Nose normal.  Mouth/Throat: Oropharynx is clear and moist.  Eyes: Conjunctivae are normal.  Neck: Neck supple. No thyromegaly present.  Cardiovascular: Normal rate, regular rhythm, normal heart sounds and intact distal pulses.   Pulmonary/Chest:  Widespread rhonchi and wheezes, air flow is quite limited. Rales are present in the right base   Musculoskeletal:  2+ edema in both feet   Lymphadenopathy:    She has no cervical adenopathy.          Assessment & Plan:  Her room air O2 sat was 73%. This went up to 93% on 2 liters of oxygen. She has a COPD exacerbation and a probable RLL pneumonia. She will drive herself immediately to [facility] ER for evaluation.

## 2013-11-22 NOTE — ED Notes (Addendum)
Pt is requesting Anxiety meds - RN [facility] Aware

## 2013-11-22 NOTE — ED Provider Notes (Signed)
CSN: [id number]     Arrival date & time 11/22/13  1035 History   First MD Initiated Contact with Patient 11/22/13 1056     Chief Complaint  Patient presents with  . Shortness of Breath     (Consider location/radiation/quality/duration/timing/severity/associated sxs/prior Treatment) HPI 73 year old female with a history of COPD presents from her PCPs office with hypoxia. She states her last 4-5 weeks she's been having a productive cough that she self diagnosed as sinus issues. Over the last 2-3 days she's been having a dry cough with shortness of breath. She had a fever of 101 last night. Denies any headaches, chest pain, abdominal pain, vomiting, or congestion. Her oxygen level was 73% and her PCPs office. She drove in to the emergency department from that office. She feels better when the oxygen is postop. She's tried albuterol at home with no relief her shortness of breath.  Past Medical History  Diagnosis Date  . HIP PAIN 03/06/2007  . HYPERTENSION 02/09/2007  . Restless leg syndrome   . COPD (chronic obstructive pulmonary disease)   . Complication of anesthesia   . Ulcer     gastric antrum    Past Surgical History  Procedure Laterality Date  . Abdominal hysterectomy    . Dilation and curettage of uterus    . Tonsillectomy    . Esophagogastroduodenoscopy  07/09/2011    Procedure: ESOPHAGOGASTRODUODENOSCOPY (EGD);  Surgeon: Missy Sabins, MD;  Location: [facility];  Service: Endoscopy;  Laterality: N/A;  patient in room 1532  . Cataract extraction w/ intraocular lens  implant, bilateral Bilateral 03/2013    Patient claims it was within the month.    Family History  Problem Relation Age of Onset  . Heart disease Sister   . Heart disease Brother   . Dementia Mother   . Anemia Father    History  Substance Use Topics  . Smoking status: Current Every Day Smoker    Types: Cigarettes  . Smokeless tobacco: Never Used     Comment: 1 pack each day  . Alcohol Use: 0.5 oz/week    1  drink(s) per week   OB History   Grav Para Term Preterm Abortions TAB SAB Ect Mult Living                 Review of Systems  Constitutional: Positive for fever.  HENT: Negative for congestion and rhinorrhea.   Respiratory: Positive for cough and shortness of breath.   Cardiovascular: Negative for chest pain.  Gastrointestinal: Negative for vomiting.  Genitourinary: Negative for dysuria and decreased urine volume.  All other systems reviewed and are negative.     Allergies  Tetanus toxoid; Codeine; Erythromycin; Meloxicam; Methylprednisolone; Penicillins; and Prednisone  Home Medications   Prior to Admission medications   Medication Sig Start Date End Date Taking? Authorizing Provider  albuterol (PROVENTIL HFA;VENTOLIN HFA) 108 (90 BASE) MCG/ACT inhaler Inhale 2 puffs into the lungs every 4 (four) hours as needed for wheezing or shortness of breath. 06/24/13   Laurey Morale, MD  albuterol-ipratropium (COMBIVENT) [phone] MCG/ACT inhaler Inhale 2 puffs into the lungs every 4 (four) hours as needed for wheezing. 05/03/13   Hosie Poisson, MD  aspirin 81 MG tablet Take 81 mg by mouth daily.      Historical Provider, MD  calcium-vitamin D (OSCAL WITH D) 500-200 MG-UNIT per tablet Take 1 tablet by mouth daily.    Historical Provider, MD  clonazePAM (KLONOPIN) 0.5 MG tablet Take 2 tablets (1 mg total) by  mouth at bedtime. 07/23/13   Laurey Morale, MD  dextromethorphan-guaiFENesin [facility] DM) 30-600 MG per 12 hr tablet Take 1 tablet by mouth 2 (two) times daily. 05/03/13   Hosie Poisson, MD  fluconazole (DIFLUCAN) 150 MG tablet Take 1 tablet (150 mg total) by mouth once. 08/23/13   Laurey Morale, MD  furosemide (LASIX) 20 MG tablet Take 1 tablet (20 mg total) by mouth daily. 09/12/13   Laurey Morale, MD  indomethacin (INDOCIN) 50 MG capsule Take 1 capsule (50 mg total) by mouth 3 (three) times daily as needed for moderate pain. 09/06/13   Laurey Morale, MD  losartan-hydrochlorothiazide (HYZAAR)  50-12.5 MG per tablet Take 1 tablet by mouth daily. 07/23/13   Laurey Morale, MD  methylPREDNISolone (MEDROL DOSEPAK) 4 MG tablet follow package directions 09/02/13   Laurey Morale, MD  ondansetron (ZOFRAN) 4 MG tablet TAKE ONE TABLET BY MOUTH EVERY FOUR HOURS AS NEEDED FOR NAUSEA  07/25/13   Laurey Morale, MD  potassium chloride (KLOR-CON 10) 10 MEQ tablet Take 1 tablet (10 mEq total) by mouth 2 (two) times daily. 07/03/13   Laurey Morale, MD  traMADol (ULTRAM) 50 MG tablet Take 2 tablets (100 mg total) by mouth every 6 (six) hours as needed for moderate pain. 08/30/13   Laurey Morale, MD  vitamin E 100 UNIT capsule Take 100 Units by mouth daily.    Historical Provider, MD   BP 155/89  Pulse 111  Temp(Src) 99.2 F (37.3 C) (Oral)  Resp 20  SpO2 85% Physical Exam  Vitals reviewed. Constitutional: She is oriented to person, place, and time. She appears well-developed and well-nourished.  HENT:  Head: Normocephalic and atraumatic.  Right Ear: External ear normal.  Left Ear: External ear normal.  Nose: Nose normal.  Eyes: Right eye exhibits no discharge. Left eye exhibits no discharge.  Cardiovascular: Normal rate, regular rhythm and normal heart sounds.   Pulmonary/Chest: Tachypnea noted. She has decreased breath sounds in the right lower field. She has wheezes.  Abdominal: Soft. There is no tenderness.  Neurological: She is alert and oriented to person, place, and time.  Skin: Skin is warm and dry.    ED Course  Procedures (including critical care time) Labs Review Labs Reviewed  CBC WITH DIFFERENTIAL - Abnormal; Notable for the following:    WBC 13.2 (*)    Neutro Abs 9.7 (*)    All other components within normal limits  COMPREHENSIVE METABOLIC PANEL - Abnormal; Notable for the following:    Sodium 128 (*)    Chloride 84 (*)    Glucose, Bld 102 (*)    Creatinine, Ser 0.39 (*)    All other components within normal limits  BLOOD GAS, ARTERIAL - Abnormal; Notable for the following:     pCO2 arterial 49.0 (*)    pO2, Arterial 62.7 (*)    Bicarbonate 28.5 (*)    Acid-Base Excess 3.1 (*)    All other components within normal limits  CULTURE, BLOOD (ROUTINE X 2)  CULTURE, BLOOD (ROUTINE X 2)  D-DIMER, QUANTITATIVE  PRO B NATRIURETIC PEPTIDE  I-STAT CG4 LACTIC ACID, ED    Imaging Review Dg Chest 2 View  11/22/2013   CLINICAL DATA:  Productive cough. Shortness of breath. COPD. Hypertension.  EXAM: CHEST  2 VIEW  COMPARISON:  06/24/2013  FINDINGS: Changes of COPD are again seen. No evidence of acute infiltrate or pulmonary edema. No evidence of pleural effusion.  Mild cardiomegaly is stable.  No evidence of congestive heart failure. No mass or lymphadenopathy identified. No significant change compared to prior exam.  IMPRESSION: Stable COPD and mild cardiomegaly.  No active lung disease.   Electronically Signed   By: Earle Gell M.D.   On: 11/22/2013 11:35     EKG Interpretation   Date/Time:  Friday November 22 2013 10:45:28 EDT Ventricular Rate:  107 PR Interval:  156 QRS Duration: 140 QT Interval:  391 QTC Calculation: 522 R Axis:   -23 Text Interpretation:  Sinus tachycardia Left atrial enlargement Left  bundle branch block Baseline wander in lead(s) I aVL V4 No significant  change since 2014 Confirmed by Derrik Mceachern  MD, [place] ([id number]) on 11/22/2013  10:49:31 AM      CRITICAL CARE Performed by: Ephraim Hamburger   Total critical care time: 30 minutes  Critical care time was exclusive of separately billable procedures and treating other patients.  Critical care was necessary to treat or prevent imminent or life-threatening deterioration.  Critical care was time spent personally by me on the following activities: development of treatment plan with patient and/or surrogate as well as nursing, discussions with consultants, evaluation of patient's response to treatment, examination of patient, obtaining history from patient or surrogate, ordering and performing treatments  and interventions, ordering and review of laboratory studies, ordering and review of radiographic studies, pulse oximetry and re-evaluation of patient's condition.  MDM   Final diagnoses:  Pneumonia  Acute respiratory failure  CAP (community acquired pneumonia)  COPD (chronic obstructive pulmonary disease)    Patient symptoms are concerning for an acute infectious process. Initial exam shows diffuse decreased lung sounds but worse in the right lower lung field. She does have some mild wheezing. Her air movement improve significantly after multiple albuterol treatments. Try to do continuous albuterol but she did not tolerate the mask due to anxiety. She did feel a bit better with a small dose of Ativan, and takes Klonopin at home. No evidence of respiratory acidosis. She is maintaining her airway and her tachypnea and work of breathing improved. She was given antibiotics to treat community acquired pneumonia as this is the most likely diagnosis. I was going to give the patient steroids but she states she is unable to take as she is intolerant to steroids. We'll admit to the step down for further respiratory care.    Ephraim Hamburger, MD 11/22/13 [phone]

## 2013-11-22 NOTE — Progress Notes (Signed)
Pre visit review using our clinic review tool, if applicable. No additional management support is needed unless otherwise documented below in the visit note. 

## 2013-11-22 NOTE — ED Notes (Signed)
Pt given continuous neb by Respiratory.

## 2013-11-22 NOTE — ED Notes (Signed)
Pt is experiencing anxiety. Pt is trying to get out of bed and requesting to take all cords off.Marland KitchenMarland KitchenMarland KitchenHeart Monitor, Pulse Ox, etc. Notified pt with her condition we needed to keep her hooked up so we can monitor her.

## 2013-11-22 NOTE — ED Notes (Signed)
Pt reports hx of COPD, has been cough for several weeks. Cough became "dry and hacky productive cough" yellow thick sputum x3 days. Dx in Dr Sarajane Jews office today with Right lower PNA. Pt was 73% on room air upon arrival to Dr Sarajane Jews office. 91% on 2 L [place] at office. Pt was told to come to ED. Pt was allowed to drive to ED. Upon arrival to ED pt O2 79%. Now 94% on 2 L [place].  Pt denies pain.

## 2013-11-23 ENCOUNTER — Inpatient Hospital Stay (HOSPITAL_COMMUNITY): Payer: Medicare Other

## 2013-11-23 DIAGNOSIS — J9600 Acute respiratory failure, unspecified whether with hypoxia or hypercapnia: Secondary | ICD-10-CM

## 2013-11-23 DIAGNOSIS — R6521 Severe sepsis with septic shock: Secondary | ICD-10-CM

## 2013-11-23 DIAGNOSIS — R6520 Severe sepsis without septic shock: Secondary | ICD-10-CM

## 2013-11-23 DIAGNOSIS — A419 Sepsis, unspecified organism: Secondary | ICD-10-CM

## 2013-11-23 DIAGNOSIS — J189 Pneumonia, unspecified organism: Secondary | ICD-10-CM

## 2013-11-23 DIAGNOSIS — I517 Cardiomegaly: Secondary | ICD-10-CM

## 2013-11-23 LAB — COMPREHENSIVE METABOLIC PANEL
ALT: 28 U/L (ref 0–35)
AST: 45 U/L — AB (ref 0–37)
Albumin: 2.9 g/dL — ABNORMAL LOW (ref 3.5–5.2)
Alkaline Phosphatase: 74 U/L (ref 39–117)
BUN: 5 mg/dL — ABNORMAL LOW (ref 6–23)
CALCIUM: 8 mg/dL — AB (ref 8.4–10.5)
CO2: 32 mEq/L (ref 19–32)
CREATININE: 0.41 mg/dL — AB (ref 0.50–1.10)
Chloride: 90 mEq/L — ABNORMAL LOW (ref 96–112)
GFR calc non Af Amer: >90 mL/min (ref >90)
GLUCOSE: 130 mg/dL — AB (ref 70–99)
Potassium: 4.2 mEq/L (ref 3.7–5.3)
SODIUM: 130 meq/L — AB (ref 137–147)
Total Bilirubin: 0.2 mg/dL — ABNORMAL LOW (ref 0.3–1.2)
Total Protein: 6.3 g/dL (ref 6.0–8.3)

## 2013-11-23 LAB — BLOOD GAS, ARTERIAL
Acid-Base Excess: 0.8 mmol/L (ref 0.0–2.0)
Acid-Base Excess: 2.5 mmol/L — ABNORMAL HIGH (ref 0.0–2.0)
BICARBONATE: 29.3 meq/L — AB (ref 20.0–24.0)
Bicarbonate: 26.7 mEq/L — ABNORMAL HIGH (ref 20.0–24.0)
DRAWN BY: 232811
DRAWN BY: 232811
DRAWN BY: 276051
FIO2: 0.5 %
FIO2: 0.6 %
FIO2: 1 %
LHR: 28 {breaths}/min
LHR: 28 {breaths}/min
MECHVT: 340 mL
O2 SAT: 88.7 %
O2 Saturation: 95.6 %
O2 Saturation: 98.9 %
PEEP/CPAP: 8 cmH2O
PEEP: 8 cmH2O
PH ART: 7.424 (ref 7.350–7.450)
Patient temperature: 98.6
Patient temperature: 98.6
Patient temperature: 98.7
TCO2: 27 mmol/L (ref 0–100)
TCO2: 23.9 mmol/L (ref 0–100)
VT: 450 mL
pCO2 arterial: 68.3 mmHg (ref 35.0–45.0)
pCO2 arterial: 41.5 mmHg (ref 35.0–45.0)
pH, Arterial: 7.066 — CL (ref 7.350–7.450)
pH, Arterial: 7.255 — ABNORMAL LOW (ref 7.350–7.450)
pO2, Arterial: 110 mmHg — ABNORMAL HIGH (ref 80.0–100.0)
pO2, Arterial: 115 mmHg — ABNORMAL HIGH (ref 80.0–100.0)
pO2, Arterial: 61.1 mmHg — ABNORMAL LOW (ref 80.0–100.0)

## 2013-11-23 LAB — LACTIC ACID, PLASMA: LACTIC ACID, VENOUS: 0.9 mmol/L (ref 0.5–2.2)

## 2013-11-23 LAB — CARBOXYHEMOGLOBIN
Carboxyhemoglobin: 2.5 % — ABNORMAL HIGH (ref 0.5–1.5)
Methemoglobin: 1.6 % — ABNORMAL HIGH (ref 0.0–1.5)
O2 Saturation: 78.1 %
TOTAL HEMOGLOBIN: 13.5 g/dL (ref 12.0–16.0)

## 2013-11-23 LAB — CBC
HCT: 38.1 % (ref 36.0–46.0)
Hemoglobin: 12.5 g/dL (ref 12.0–15.0)
MCH: 32.4 pg (ref 26.0–34.0)
MCHC: 32.8 g/dL (ref 30.0–36.0)
MCV: 98.7 fL (ref 78.0–100.0)
Platelets: 261 10*3/uL (ref 150–400)
RBC: 3.86 MIL/uL — ABNORMAL LOW (ref 3.87–5.11)
RDW: 14.8 % (ref 11.5–15.5)
WBC: 12.3 10*3/uL — ABNORMAL HIGH (ref 4.0–10.5)

## 2013-11-23 LAB — GLUCOSE, CAPILLARY
GLUCOSE-CAPILLARY: 151 mg/dL — AB (ref 70–99)
Glucose-Capillary: 138 mg/dL — ABNORMAL HIGH (ref 70–99)

## 2013-11-23 LAB — CORTISOL: Cortisol, Plasma: 42.1 ug/dL

## 2013-11-23 LAB — PROCALCITONIN: Procalcitonin: <0.1 ng/mL

## 2013-11-23 LAB — TROPONIN I

## 2013-11-23 MED ORDER — PROPOFOL 10 MG/ML IV EMUL
INTRAVENOUS | Status: AC
  Filled 2013-11-23: qty 100

## 2013-11-23 MED ORDER — DOPAMINE-DEXTROSE 3.2-5 MG/ML-% IV SOLN: 2.0000 ug/kg/min | INTRAVENOUS | Status: DC

## 2013-11-23 MED ORDER — SODIUM CHLORIDE 0.9 % IV SOLN
INTRAVENOUS | Status: DC
  Administered 2013-11-23 – 2013-11-26 (×2): via INTRAVENOUS
  Administered 2013-11-28: 10 mL via INTRAVENOUS

## 2013-11-23 MED ORDER — FENTANYL CITRATE 0.05 MG/ML IJ SOLN
100.0000 ug | Freq: Once | INTRAMUSCULAR | Status: AC
  Administered 2013-11-23: 100 ug via INTRAVENOUS

## 2013-11-23 MED ORDER — ETOMIDATE 2 MG/ML IV SOLN
15.0000 mg | Freq: Once | INTRAVENOUS | Status: AC
  Administered 2013-11-23: 15 mg via INTRAVENOUS

## 2013-11-23 MED ORDER — FENTANYL BOLUS VIA INFUSION
25.0000 ug | INTRAVENOUS | Status: DC | PRN
  Filled 2013-11-23: qty 50

## 2013-11-23 MED ORDER — LORAZEPAM 2 MG/ML IJ SOLN
INTRAMUSCULAR | Status: AC
  Administered 2013-11-23: 2 mg
  Filled 2013-11-23: qty 1

## 2013-11-23 MED ORDER — LORAZEPAM 2 MG/ML IJ SOLN: 2.0000 mg | Freq: Once | INTRAMUSCULAR | Status: AC

## 2013-11-23 MED ORDER — BIOTENE DRY MOUTH MT LIQD
15.0000 mL | Freq: Four times a day (QID) | OROMUCOSAL | Status: DC
  Administered 2013-11-23 – 2013-12-02 (×31): 15 mL via OROMUCOSAL

## 2013-11-23 MED ORDER — FUROSEMIDE 10 MG/ML IJ SOLN
60.0000 mg | Freq: Once | INTRAMUSCULAR | Status: AC
  Administered 2013-11-23: 60 mg via INTRAVENOUS
  Filled 2013-11-23: qty 6

## 2013-11-23 MED ORDER — DIPHENHYDRAMINE HCL 50 MG/ML IJ SOLN: 12.5000 mg | Freq: Four times a day (QID) | INTRAMUSCULAR | Status: DC | PRN

## 2013-11-23 MED ORDER — DEXTROSE 5 % IV SOLN
2.0000 g | INTRAVENOUS | Status: DC
  Administered 2013-11-23: 2 g via INTRAVENOUS
  Filled 2013-11-23: qty 2

## 2013-11-23 MED ORDER — ATROPINE SULFATE 0.1 MG/ML IJ SOLN: 0.4000 mg | Freq: Once | INTRAMUSCULAR | Status: DC

## 2013-11-23 MED ORDER — MIDAZOLAM HCL 2 MG/2ML IJ SOLN
INTRAMUSCULAR | Status: AC
  Filled 2013-11-23: qty 2

## 2013-11-23 MED ORDER — HYDROCORTISONE NA SUCCINATE PF 100 MG IJ SOLR
100.0000 mg | Freq: Three times a day (TID) | INTRAMUSCULAR | Status: DC
  Administered 2013-11-23 (×2): 100 mg via INTRAVENOUS
  Filled 2013-11-23 (×2): qty 2

## 2013-11-23 MED ORDER — HYDROCORTISONE SOD SUCCINATE 100 MG IJ SOLR
50.0000 mg | Freq: Four times a day (QID) | INTRAMUSCULAR | Status: DC
  Administered 2013-11-23 – 2013-11-24 (×3): 50 mg via INTRAVENOUS
  Filled 2013-11-23 (×2): qty 1
  Filled 2013-11-23 (×2): qty 2
  Filled 2013-11-23 (×4): qty 1
  Filled 2013-11-23: qty 2
  Filled 2013-11-23: qty 1

## 2013-11-23 MED ORDER — METHYLPREDNISOLONE SODIUM SUCC 125 MG IJ SOLR
INTRAMUSCULAR | Status: AC
  Filled 2013-11-23: qty 2

## 2013-11-23 MED ORDER — PANTOPRAZOLE SODIUM 40 MG IV SOLR
40.0000 mg | Freq: Every day | INTRAVENOUS | Status: DC
  Administered 2013-11-23 – 2013-11-24 (×3): 40 mg via INTRAVENOUS
  Filled 2013-11-23 (×3): qty 40

## 2013-11-23 MED ORDER — KETOROLAC TROMETHAMINE 15 MG/ML IJ SOLN
INTRAMUSCULAR | Status: AC
Start: 2013-11-23 — End: 2013-11-23
  Filled 2013-11-23: qty 1

## 2013-11-23 MED ORDER — LORAZEPAM 2 MG/ML IJ SOLN
2.0000 mg | Freq: Once | INTRAMUSCULAR | Status: AC
  Administered 2013-11-23: 2 mg via INTRAVENOUS

## 2013-11-23 MED ORDER — FENTANYL CITRATE 0.05 MG/ML IJ SOLN
INTRAMUSCULAR | Status: AC
Start: 2013-11-23 — End: 2013-11-23
  Filled 2013-11-23: qty 2

## 2013-11-23 MED ORDER — DEXTROSE 5 % IV SOLN
1.0000 g | Freq: Two times a day (BID) | INTRAVENOUS | Status: DC
  Administered 2013-11-23 – 2013-11-25 (×4): 1 g via INTRAVENOUS
  Filled 2013-11-23 (×4): qty 1

## 2013-11-23 MED ORDER — ALBUTEROL SULFATE (2.5 MG/3ML) 0.083% IN NEBU
2.5000 mg | INHALATION_SOLUTION | RESPIRATORY_TRACT | Status: DC | PRN
  Administered 2013-11-25: 2.5 mg via RESPIRATORY_TRACT
  Filled 2013-11-23: qty 3

## 2013-11-23 MED ORDER — SUCCINYLCHOLINE CHLORIDE 20 MG/ML IJ SOLN
80.0000 mg | Freq: Once | INTRAMUSCULAR | Status: AC
  Administered 2013-11-23: 80 mg via INTRAVENOUS

## 2013-11-23 MED ORDER — DOPAMINE-DEXTROSE 3.2-5 MG/ML-% IV SOLN
INTRAVENOUS | Status: AC
  Administered 2013-11-23: 800 mg
  Filled 2013-11-23: qty 250

## 2013-11-23 MED ORDER — MIDAZOLAM HCL 2 MG/2ML IJ SOLN
2.0000 mg | Freq: Once | INTRAMUSCULAR | Status: AC
  Administered 2013-11-23: 2 mg via INTRAVENOUS

## 2013-11-23 MED ORDER — VITAL HIGH PROTEIN PO LIQD
1000.0000 mL | ORAL | Status: DC
Start: 2013-11-23 — End: 2013-11-25
  Administered 2013-11-23: 1000 mL
  Filled 2013-11-23 (×3): qty 1000

## 2013-11-23 MED ORDER — DEXTROSE 5 % IV SOLN
2.0000 ug/min | INTRAVENOUS | Status: DC
  Administered 2013-11-23: 5 ug/min via INTRAVENOUS
  Filled 2013-11-23: qty 4

## 2013-11-23 MED ORDER — PROPOFOL 10 MG/ML IV EMUL: 5.0000 ug/kg/min | INTRAVENOUS | Status: DC

## 2013-11-23 MED ORDER — LORAZEPAM 2 MG/ML IJ SOLN
INTRAMUSCULAR | Status: AC
  Filled 2013-11-23: qty 1

## 2013-11-23 MED ORDER — KETOROLAC TROMETHAMINE 15 MG/ML IJ SOLN
15.0000 mg | Freq: Once | INTRAMUSCULAR | Status: AC
  Administered 2013-11-23: 15 mg via INTRAVENOUS

## 2013-11-23 MED ORDER — SODIUM CHLORIDE 0.9 % IV SOLN
0.0000 mg/h | INTRAVENOUS | Status: DC
  Administered 2013-11-23: 3 mg/h via INTRAVENOUS
  Administered 2013-11-23: 1 mg/h via INTRAVENOUS
  Filled 2013-11-23 (×3): qty 10

## 2013-11-23 MED ORDER — FENTANYL CITRATE 0.05 MG/ML IJ SOLN
INTRAMUSCULAR | Status: AC
  Filled 2013-11-23: qty 2

## 2013-11-23 MED ORDER — SODIUM CHLORIDE 0.9 % IV SOLN
0.0000 ug/h | INTRAVENOUS | Status: DC
  Administered 2013-11-23: 150 ug/h via INTRAVENOUS
  Administered 2013-11-23: 50 ug/h via INTRAVENOUS
  Filled 2013-11-23 (×3): qty 50

## 2013-11-23 MED ORDER — CHLORHEXIDINE GLUCONATE 0.12 % MT SOLN
15.0000 mL | Freq: Two times a day (BID) | OROMUCOSAL | Status: DC
  Administered 2013-11-23 – 2013-12-03 (×20): 15 mL via OROMUCOSAL
  Filled 2013-11-23 (×22): qty 15

## 2013-11-23 MED ORDER — IPRATROPIUM-ALBUTEROL 0.5-2.5 (3) MG/3ML IN SOLN
3.0000 mL | RESPIRATORY_TRACT | Status: DC
  Administered 2013-11-23 – 2013-11-25 (×14): 3 mL via RESPIRATORY_TRACT
  Filled 2013-11-23 (×13): qty 3

## 2013-11-23 MED ORDER — ATROPINE SULFATE 0.1 MG/ML IJ SOLN
INTRAMUSCULAR | Status: AC
  Filled 2013-11-23: qty 10

## 2013-11-23 MED ORDER — METHYLPREDNISOLONE SODIUM SUCC 125 MG IJ SOLR
60.0000 mg | Freq: Three times a day (TID) | INTRAMUSCULAR | Status: DC
  Administered 2013-11-23: 60 mg via INTRAVENOUS

## 2013-11-23 MED ORDER — PHENYLEPHRINE HCL 10 MG/ML IJ SOLN
30.0000 ug/min | INTRAVENOUS | Status: DC
  Administered 2013-11-23: 30 ug/min via INTRAVENOUS
  Administered 2013-11-23: 50 ug/min via INTRAVENOUS
  Administered 2013-11-24 (×2): 30 ug/min via INTRAVENOUS
  Administered 2013-11-24: 45 ug/min via INTRAVENOUS
  Filled 2013-11-23 (×6): qty 1

## 2013-11-23 MED ORDER — SODIUM CHLORIDE 0.9 % IV BOLUS (SEPSIS)
500.0000 mL | Freq: Once | INTRAVENOUS | Status: AC
  Administered 2013-11-23: 500 mL via INTRAVENOUS

## 2013-11-23 NOTE — Progress Notes (Signed)
PULMONARY / CRITICAL CARE MEDICINE   Name: Jill White MRN: [id number] DOB: 06-20-1941    ADMISSION DATE:  11/22/2013 CONSULTATION DATE:  11/23/13  REFERRING MD :  Dr. Arnoldo Morale PRIMARY SERVICE: [facility]  CHIEF COMPLAINT:   Acute hypercarbic and hypoxemic respiratory failure, hypotension  BRIEF PATIENT DESCRIPTION:  73 years old female with PMH relevant for HTN and COPD. Admitted under [facility] car with respiratory distress and hypoxemia to the 70's. Initially more stable but now progressed to severe hypercarbic and hypoxemic respiratory failure requiring intubation and mechanical ventilation. Chest X ray with worsening infiltrates.   SIGNIFICANT EVENTS / STUDIES:  Intubation 11/23/13  LINES / TUBES: Left IJ CVC6/6>>> ETT 6/6>>> Foley catheter 6/6>>> L radial a-line 6/6>>>  CULTURES: Blood cultures 6/6>>> Tracheal aspirate 6/6>>> Urine culture 6/6>>>  ANTIBIOTICS: Cefepime 6/6>>> Levaquin 6/6>>> Vancomycin 6/6>>>  SUBJECTIVE: acute hypercarbic/hypoxemic respiratory failure overnight.  VITAL SIGNS: Temp:  [98.1 F (36.7 C)-102 F (38.9 C)] 98.4 F (36.9 C) (06/06 0945) Pulse Rate:  [51-132] 58 (06/06 0945) Resp:  [18-33] 28 (06/06 0945) BP: (71-198)/(26-89) 106/70 mmHg (06/06 0915) SpO2:  [79 %-100 %] 100 % (06/06 0945) Arterial Line BP: (94-216)/(41-83) 105/44 mmHg (06/06 0700) FiO2 (%):  [50 %-60 %] 60 % (06/06 0929) Weight:  [121 lb 7.6 oz (55.1 kg)-126 lb 1.7 oz (57.2 kg)] 126 lb 1.7 oz (57.2 kg) (06/06 0510) HEMODYNAMICS: CVP:  [6 mmHg-16 mmHg] 10 mmHg VENTILATOR SETTINGS: Vent Mode:  [-] PRVC FiO2 (%):  [50 %-60 %] 60 % Set Rate:  [28 bmp] 28 bmp Vt Set:  [340 mL-450 mL] 450 mL PEEP:  [8 cmH20] 8 cmH20 Plateau Pressure:  [20 cmH20-24 cmH20] 23 cmH20 INTAKE / OUTPUT: Intake/Output     06/05 0701 - 06/06 0700 06/06 0701 - 06/07 0700   I.V. (mL/kg) 202.3 (3.5) 314 (5.5)   IV Piggyback 150    Total Intake(mL/kg) 352.3 (6.2) 314 (5.5)    Urine (mL/kg/hr) 1000 200 (1.1)   Total Output 1000 200   Net -647.7 +114        Urine Occurrence 2 x      PHYSICAL EXAMINATION: General: Sedated, intubated, no acute distress. Eyes: Anicteric sclerae. Pupils are equal and reactive to light. ENT: ETT in place. Trachea at midline.  Lymph: No cervical, supraclavicular, or axillary lymphadenopathy. Heart: Normal S1, S2. No murmurs, rubs, or gallops appreciated. No bruits, equal pulses. Lungs: Bilateral diffuse crackles. No wheezing. Abdomen: Abdomen soft, non-tender and not distended, normoactive bowel sounds. No hepatosplenomegaly or masses. Musculoskeletal: No clubbing or synovitis. No LE edema Skin: No rashes or lesions Neuro: Patient is unresponsive. Sedated   LABS:  CBC  Recent Labs Lab 11/22/13 1145 11/23/13 0115  WBC 13.2* 12.3*  HGB 14.4 12.5  HCT 41.6 38.1  PLT 303 261   Coag's No results found for this basename: APTT, INR,  in the last 168 hours BMET  Recent Labs Lab 11/22/13 1145 11/23/13 0115  NA 128* 130*  K 4.2 4.2  CL 84* 90*  CO2 32 32  BUN 6 5*  CREATININE 0.39* 0.41*  GLUCOSE 102* 130*   Electrolytes  Recent Labs Lab 11/22/13 1145 11/23/13 0115  CALCIUM 9.0 8.0*   Sepsis Markers  Recent Labs Lab 11/22/13 1156 11/23/13 0046 11/23/13 0620  LATICACIDVEN 1.51  --  0.9  PROCALCITON  --  <0.10  --    ABG  Recent Labs Lab 11/22/13 1232 11/23/13 0020 11/23/13 0520  PHART 7.382 7.066* 7.255*  PCO2ART 49.0* ABOVE  REPORTABLE RANGE 68.3*  PO2ART 62.7* 110.0* 61.1*   Liver Enzymes  Recent Labs Lab 11/22/13 1145 11/23/13 0115  AST 28 45*  ALT 19 28  ALKPHOS 85 74  BILITOT 0.4 0.2*  ALBUMIN 3.8 2.9*   Cardiac Enzymes  Recent Labs Lab 11/22/13 1145 11/23/13 0620  TROPONINI  --  <0.30  PROBNP 4145.0*  --    Glucose No results found for this basename: GLUCAP,  in the last 168 hours  Imaging Dg Chest 2 View  11/22/2013   CLINICAL DATA:  Productive cough. Shortness  of breath. COPD. Hypertension.  EXAM: CHEST  2 VIEW  COMPARISON:  06/24/2013  FINDINGS: Changes of COPD are again seen. No evidence of acute infiltrate or pulmonary edema. No evidence of pleural effusion.  Mild cardiomegaly is stable. No evidence of congestive heart failure. No mass or lymphadenopathy identified. No significant change compared to prior exam.  IMPRESSION: Stable COPD and mild cardiomegaly.  No active lung disease.   Electronically Signed   By: Earle Gell M.D.   On: 11/22/2013 11:35   Ct Angio Chest Pe W/cm &/or Wo Cm  11/22/2013   CLINICAL DATA:  Cough, shortness of breath and fever and hypoxia ; history of COPD and tobacco use  EXAM: CT ANGIOGRAPHY CHEST WITH CONTRAST  TECHNIQUE: Multidetector CT imaging of the chest was performed using the standard protocol during bolus administration of intravenous contrast. Multiplanar CT image reconstructions and MIPs were obtained to evaluate the vascular anatomy.  CONTRAST:  153mL OMNIPAQUE IOHEXOL 350 MG/ML SOLN  COMPARISON:  PA and lateral chest x-ray of today's date  FINDINGS: There is respiratory motion artifact due to the patient's tachypnea. There are patchy interstitial infiltrates in both lower lobes and in the right middle lobe. There is mild bronchiectasis in the right lower lobe. There is a small right pleural effusion layering posteriorly. The cardiac chambers are mildly enlarged. The caliber of the thoracic aorta is normal. Contrast within the visualized portions of the pulmonary arterial tree is normal. There is an enlarged right hilar lymph node measuring 2.3 cm in diameter. A subcarinal lymph node measures 1.3 cmin short axis. A left hilar lymph node measures 0.8 cm in short axis.  Within the upper abdomen the observed portions of the liver and spleen and adrenal glands exhibit no acute abnormalities.  The thoracic vertebral bodies are preserved in height. Anterior endplate osteophytes are present at multiple levels. No acute rib abnormality  is demonstrated.  Review of the MIP images confirms the above findings.  IMPRESSION: 1. Bibasilar pneumonia is present with small right pleural effusion. There is underlying COPD. 2. There are mildly enlarged mediastinal and right hilar lymph nodes. 3. There is mild cardiac enlargement without evidence of pulmonary edema. The observed portions of the pulmonary arterial tree reveal no emboli.   Electronically Signed   By: David  Martinique   On: 11/22/2013 21:56   Dg Chest Port 1 View  11/23/2013   CLINICAL DATA:  Endotracheal tube placement and central line placement.  EXAM: PORTABLE CHEST - 1 VIEW  COMPARISON:  11/23/2013.  FINDINGS: The endotracheal tube is 4.5 cm above the carina. The left IJ central venous catheter tip is in the distal SVC. The lungs show slight improved aeration status post intubation.  IMPRESSION: The endotracheal tube is in good position, 4.5 cm above the carina.  Left IJ central venous catheter tip is in the distal SVC.  Slight improved lung aeration.   Electronically Signed   By: Elta Gu[place]oupe  Gallerani M.D.   On: 11/23/2013 04:54   Dg Chest Port 1 View  11/23/2013   CLINICAL DATA:  Respiratory distress.  Hypoxia.  EXAM: PORTABLE CHEST - 1 VIEW  COMPARISON:  11/22/2013.  FINDINGS: The heart is enlarged. There is tortuosity and calcification of the thoracic aorta. Interval development of fulminant pulmonary edema. Small right pleural effusion.  IMPRESSION: CHF with fulminant pattern of pulmonary edema.   Electronically Signed   By: Kalman Jewels M.D.   On: 11/23/2013 00:44     CXR:  Increased bilateral patchy infiltrates, pulmonary edema vs bilateral pneumonia.   ASSESSMENT / PLAN:  PULMONARY A: 1) Acute hypoxemic and hypercarbic respiratory failure.  2) Fever, elevated WBC and worsening infiltrates suggests pneumonia.  P:   - Mechanical ventilation, full support. - Repeat ABG now and will adjust vent accordingly. - VAP prevention order set - Daily awakening and SBT - [place] q4  hrs - Albuterol PRN - Antibiotics on ID section - Will add respiratory viral panel.  CARDIOVASCULAR A:  1) Hypotension likely related to septic shock.  2) Last echocardiogram from 2014 with normal LVEF but diastolic dysfunction and moderate PAH. BNP 4145 P:  - On Dopamine. We will wean norepinephrine first given tendency to bradycardia. - Echocardiogram ordered and pending. - Insert A Line now. - Lactic acid 0.9. - Troponin noted. - Got lasix before intubation, will hold for now given hypotension. - Follow CVP.  RENAL A:   1) Normal creatinine P:   - Will follow chemistry. - Replace electrolytes as needed. - Hold lasix.  GASTROINTESTINAL A:   1) No issues P:   - GI prophylaxis with IV protonix. - Consult nutrition for TF as per nutrition.  HEMATOLOGIC A:  1) No issues P:  - DVT prophylaxis with SQ heparin  INFECTIOUS A:   1) Possible multilobar pneumonia P:   - Cefepime - Levaquin off. - Vancomycin - F/U on culture.  ENDOCRINE A:   1) No issues   P:   - CBG. - Stress dose steroids.  NEUROLOGIC A:   1) No issues 2) Did not tolerate propofol because of hypotension and severe bradycardia. 3)  P:   - Versed drip - Fentanyl drip - PRN fentanyl - RASS goal: -1  Family including daughter who is POA are at the bedside. I personally updated them and the understand that the patient is in critical condition and we will not be attempting weaning today.  TODAY'S SUMMARY: continue vent support, check ABG now and adjust vent, send respiratory virus panel, start nutrition.  I have personally obtained a history, examined the patient, evaluated laboratory and imaging results, formulated the assessment and plan and placed orders.  CRITICAL CARE: The patient is critically ill with multiple organ systems failure and requires high complexity decision making for assessment and support, frequent evaluation and titration of therapies, application of advanced monitoring  technologies and extensive interpretation of multiple databases. Critical Care Time devoted to patient care services described in this note is 35 minutes.   Rush Farmer, M.D. [facility] Pulmonary/Critical Care Medicine. Pager: [phone]. After hours pager: [phone].  11/23/2013, 10:12 AM

## 2013-11-23 NOTE — Consult Note (Addendum)
PULMONARY / CRITICAL CARE MEDICINE   Name: Jill White MRN: [id number] DOB: 1940/08/07    ADMISSION DATE:  11/22/2013 CONSULTATION DATE:  11/23/13  REFERRING MD :  Dr. Arnoldo Morale PRIMARY SERVICE: [facility]  CHIEF COMPLAINT:   Acute hypercarbic and hypoxemic respiratory failure, hypotension  BRIEF PATIENT DESCRIPTION:  73 years old female with PMH relevant for HTN and COPD. Admitted under [facility] car with respiratory distress and hypoxemia to the 70's. Initially more stable but now progressed to severe hypercarbic and hypoxemic respiratory failure requiring intubation and mechanical ventilation. Chest X ray with worsening infiltrates.   SIGNIFICANT EVENTS / STUDIES:  - Intubation 11/23/13  LINES / TUBES: - Left IJ CVC - ETT - Foley catheter  CULTURES: - Blood cultures - Tracheal aspirate - Urine culture  ANTIBIOTICS: - Cefepime  - Levaquin - Vancomycin  HISTORY OF PRESENT ILLNESS:   73 years old female with PMH relevant for HTN and COPD. Admitted under [facility] car with respiratory distress and hypoxemia to the 70's. Started on Levofloxacin  And bronchodilators and Initially more stable but now progressed to severe hypercarbic and hypoxemic respiratory failure. Started on BiPAP but ended up requiring intubation and mechanical ventilation. Chest X ray with worsening infiltrates. She has an elevated WBC and today spiked a fever up to 102. After intubation and sedation with propofol developed hypotension and bradycardia. Propofol was discontinued and required norepinephrine and dopamine.   PAST MEDICAL HISTORY :  Past Medical History  Diagnosis Date  . HIP PAIN 03/06/2007  . HYPERTENSION 02/09/2007  . Restless leg syndrome   . COPD (chronic obstructive pulmonary disease)   . Complication of anesthesia   . Ulcer     gastric antrum    Past Surgical History  Procedure Laterality Date  . Abdominal hysterectomy    . Dilation and curettage of uterus     . Tonsillectomy    . Esophagogastroduodenoscopy  07/09/2011    Procedure: ESOPHAGOGASTRODUODENOSCOPY (EGD);  Surgeon: Missy Sabins, MD;  Location: [facility];  Service: Endoscopy;  Laterality: N/A;  patient in room 1532  . Cataract extraction w/ intraocular lens  implant, bilateral Bilateral 03/2013    Patient claims it was within the month.    Prior to Admission medications   Medication Sig Start Date End Date Taking? Authorizing Provider  albuterol (PROVENTIL HFA;VENTOLIN HFA) 108 (90 BASE) MCG/ACT inhaler Inhale 2 puffs into the lungs every 4 (four) hours as needed for wheezing or shortness of breath. 06/24/13  Yes Laurey Morale, MD  aspirin 81 MG tablet Take 81 mg by mouth every morning.    Yes Historical Provider, MD  clonazePAM (KLONOPIN) 0.5 MG tablet Take 1 mg by mouth at bedtime.   Yes Historical Provider, MD  dextromethorphan-guaiFENesin (MUCINEX DM) 30-600 MG per 12 hr tablet Take 1 tablet by mouth 2 (two) times daily.   Yes Historical Provider, MD  furosemide (LASIX) 20 MG tablet Take 20 mg by mouth every morning.   Yes Historical Provider, MD  losartan-hydrochlorothiazide (HYZAAR) 50-12.5 MG per tablet Take 1 tablet by mouth every morning.   Yes Historical Provider, MD  ondansetron (ZOFRAN) 4 MG tablet Take 4 mg by mouth every 4 (four) hours as needed for nausea or vomiting.   Yes Historical Provider, MD  potassium chloride (K-DUR) 10 MEQ tablet Take 10 mEq by mouth 2 (two) times daily.   Yes Historical Provider, MD  vitamin E 100 UNIT capsule Take 100 Units by mouth every morning.  Yes Historical Provider, MD   Allergies  Allergen Reactions  . Tetanus Toxoid Anaphylaxis  . Codeine Nausea And Vomiting and Other (See Comments)    Itching and faint   . Erythromycin Nausea And Vomiting  . Meloxicam Itching  . Methylprednisolone Hives  . Penicillins Hives  . Prednisone Other (See Comments)    GI bleed    FAMILY HISTORY:  Family History  Problem Relation Age of Onset  .  Heart disease Sister   . Heart disease Brother   . Dementia Mother   . Anemia Father    SOCIAL HISTORY:  reports that she has been smoking Cigarettes.  She has been smoking about 0.00 packs per day. She has never used smokeless tobacco. She reports that she drinks about .5 ounces of alcohol per week. She reports that she does not use illicit drugs.  REVIEW OF SYSTEMS:  Unable to provide  SUBJECTIVE:   VITAL SIGNS: Temp:  [98.2 F (36.8 C)-102 F (38.9 C)] 98.8 F (37.1 C) (06/06 0501) Pulse Rate:  [52-132] 63 (06/06 0501) Resp:  [18-33] 28 (06/06 0501) BP: (71-198)/(26-92) 144/51 mmHg (06/06 0501) SpO2:  [79 %-99 %] 94 % (06/06 0501) FiO2 (%):  [50 %-60 %] 60 % (06/06 0533) Weight:  [121 lb 7.6 oz (55.1 kg)-126 lb 1.7 oz (57.2 kg)] 126 lb 1.7 oz (57.2 kg) (06/06 0510) HEMODYNAMICS:   VENTILATOR SETTINGS: Vent Mode:  [-] PRVC FiO2 (%):  [50 %-60 %] 60 % Set Rate:  [28 bmp] 28 bmp Vt Set:  [340 mL-450 mL] 450 mL PEEP:  [8 cmH20] 8 cmH20 Plateau Pressure:  [20 cmH20-24 cmH20] 20 cmH20 INTAKE / OUTPUT: Intake/Output     06/05 0701 - 06/06 0700   IV Piggyback 100   Total Intake(mL/kg) 100 (1.7)   Urine (mL/kg/hr) 600   Total Output 600   Net -500       Urine Occurrence 2 x     PHYSICAL EXAMINATION: General: Sedated, intubated, no acute distress. Eyes: Anicteric sclerae. Pupils are equal and reactive to light. ENT: ETT in place. Trachea at midline.  Lymph: No cervical, supraclavicular, or axillary lymphadenopathy. Heart: Normal S1, S2. No murmurs, rubs, or gallops appreciated. No bruits, equal pulses. Lungs: Bilateral diffuse crackles. No wheezing. Abdomen: Abdomen soft, non-tender and not distended, normoactive bowel sounds. No hepatosplenomegaly or masses. Musculoskeletal: No clubbing or synovitis. No LE edema Skin: No rashes or lesions Neuro: Patient is unresponsive. Sedated   LABS:  CBC  Recent Labs Lab 11/22/13 1145 11/23/13 0115  WBC 13.2* 12.3*  HGB  14.4 12.5  HCT 41.6 38.1  PLT 303 261   Coag's No results found for this basename: APTT, INR,  in the last 168 hours BMET  Recent Labs Lab 11/22/13 1145 11/23/13 0115  NA 128* 130*  K 4.2 4.2  CL 84* 90*  CO2 32 32  BUN 6 5*  CREATININE 0.39* 0.41*  GLUCOSE 102* 130*   Electrolytes  Recent Labs Lab 11/22/13 1145 11/23/13 0115  CALCIUM 9.0 8.0*   Sepsis Markers  Recent Labs Lab 11/22/13 1156 11/23/13 0046  LATICACIDVEN 1.51  --   PROCALCITON  --  <0.10   ABG  Recent Labs Lab 11/22/13 1232 11/23/13 0020 11/23/13 0520  PHART 7.382 7.066* 7.255*  PCO2ART 49.0* ABOVE REPORTABLE RANGE 68.3*  PO2ART 62.7* 110.0* 61.1*   Liver Enzymes  Recent Labs Lab 11/22/13 1145 11/23/13 0115  AST 28 45*  ALT 19 28  ALKPHOS 85 74  BILITOT 0.4 0.2*  ALBUMIN 3.8 2.9*   Cardiac Enzymes  Recent Labs Lab 11/22/13 1145  PROBNP 4145.0*   Glucose No results found for this basename: GLUCAP,  in the last 168 hours  Imaging Dg Chest 2 View  11/22/2013   CLINICAL DATA:  Productive cough. Shortness of breath. COPD. Hypertension.  EXAM: CHEST  2 VIEW  COMPARISON:  06/24/2013  FINDINGS: Changes of COPD are again seen. No evidence of acute infiltrate or pulmonary edema. No evidence of pleural effusion.  Mild cardiomegaly is stable. No evidence of congestive heart failure. No mass or lymphadenopathy identified. No significant change compared to prior exam.  IMPRESSION: Stable COPD and mild cardiomegaly.  No active lung disease.   Electronically Signed   By: Earle Gell M.D.   On: 11/22/2013 11:35   Ct Angio Chest Pe W/cm &/or Wo Cm  11/22/2013   CLINICAL DATA:  Cough, shortness of breath and fever and hypoxia ; history of COPD and tobacco use  EXAM: CT ANGIOGRAPHY CHEST WITH CONTRAST  TECHNIQUE: Multidetector CT imaging of the chest was performed using the standard protocol during bolus administration of intravenous contrast. Multiplanar CT image reconstructions and MIPs were  obtained to evaluate the vascular anatomy.  CONTRAST:  115mL OMNIPAQUE IOHEXOL 350 MG/ML SOLN  COMPARISON:  PA and lateral chest x-ray of today's date  FINDINGS: There is respiratory motion artifact due to the patient's tachypnea. There are patchy interstitial infiltrates in both lower lobes and in the right middle lobe. There is mild bronchiectasis in the right lower lobe. There is a small right pleural effusion layering posteriorly. The cardiac chambers are mildly enlarged. The caliber of the thoracic aorta is normal. Contrast within the visualized portions of the pulmonary arterial tree is normal. There is an enlarged right hilar lymph node measuring 2.3 cm in diameter. A subcarinal lymph node measures 1.3 cmin short axis. A left hilar lymph node measures 0.8 cm in short axis.  Within the upper abdomen the observed portions of the liver and spleen and adrenal glands exhibit no acute abnormalities.  The thoracic vertebral bodies are preserved in height. Anterior endplate osteophytes are present at multiple levels. No acute rib abnormality is demonstrated.  Review of the MIP images confirms the above findings.  IMPRESSION: 1. Bibasilar pneumonia is present with small right pleural effusion. There is underlying COPD. 2. There are mildly enlarged mediastinal and right hilar lymph nodes. 3. There is mild cardiac enlargement without evidence of pulmonary edema. The observed portions of the pulmonary arterial tree reveal no emboli.   Electronically Signed   By: David  Martinique   On: 11/22/2013 21:56   Dg Chest Port 1 View  11/23/2013   CLINICAL DATA:  Endotracheal tube placement and central line placement.  EXAM: PORTABLE CHEST - 1 VIEW  COMPARISON:  11/23/2013.  FINDINGS: The endotracheal tube is 4.5 cm above the carina. The left IJ central venous catheter tip is in the distal SVC. The lungs show slight improved aeration status post intubation.  IMPRESSION: The endotracheal tube is in good position, 4.5 cm above the  carina.  Left IJ central venous catheter tip is in the distal SVC.  Slight improved lung aeration.   Electronically Signed   By: Kalman Jewels M.D.   On: 11/23/2013 04:54   Dg Chest Port 1 View  11/23/2013   CLINICAL DATA:  Respiratory distress.  Hypoxia.  EXAM: PORTABLE CHEST - 1 VIEW  COMPARISON:  11/22/2013.  FINDINGS: The heart is enlarged. There is tortuosity and calcification  of the thoracic aorta. Interval development of fulminant pulmonary edema. Small right pleural effusion.  IMPRESSION: CHF with fulminant pattern of pulmonary edema.   Electronically Signed   By: Kalman Jewels M.D.   On: 11/23/2013 00:44     CXR:  Increased bilateral patchy infiltrates, pulmonary edema vs bilateral pneumonia.   ASSESSMENT / PLAN:  PULMONARY A: 1) Acute hypoxemic and hypercarbic respiratory failure.  2) Fever, elevated WBC and worsening infiltrates suggests pneumonia. Cannot rule out CHF. P:   - - Mechanical ventilation   - PRVC, Vt: 8cc/kg, PEEP: 5, RR: 16, FiO2: 100% and adjust to keep O2 sat > 94%   - VAP prevention order set   - Daily awakening and SBT - [place] q4 hrs - Albuterol PRN - Antibiotics on ID section  CARDIOVASCULAR A:  1) Hypotension likely related to septic shock.  2) Last echocardiogram from 2014 with normal LVEF but diastolic dysfunction and moderate PAH. BNP 4145 P:  - On Norepinephrine and dopamine. We will wean norepinephrine first given tendency to bradycardia - Will get mixed venous sat. - Will get echocardiogram - Insert A. Line - Will get lactic acid - Will get troponin - Got lasix before intubation  RENAL A:   1) Normal creatinine P:   - Will follow chemistry  GASTROINTESTINAL A:   1) No issues P:   - GI prophylaxis with IV protonix  HEMATOLOGIC A:  1) No issues P:  - DVT prophylaxis with SQ heparin  INFECTIOUS A:   1) Possible multilobar pneumonia P:   - Cefepime - Levaquin - Vancomycin  ENDOCRINE A:   1) No issues   P:      NEUROLOGIC A:   1) No issues 2) Did not tolerate propofol because of hypotension and severe bradycardia. 3)  P:   - Versed drip - Fentanyl drip - PRN fentanyl RASS goal: -1  Family including daughter who is POA are at the bedside. I personally updated them and the understand that the patient is in critical condition.  TODAY'S SUMMARY:   I have personally obtained a history, examined the patient, evaluated laboratory and imaging results, formulated the assessment and plan and placed orders. CRITICAL CARE: The patient is critically ill with multiple organ systems failure and requires high complexity decision making for assessment and support, frequent evaluation and titration of therapies, application of advanced monitoring technologies and extensive interpretation of multiple databases. Critical Care Time devoted to patient care services described in this note is 60 minutes.   Waynetta Pean, MD Pulmonary and [place] Pager: [phone]  11/23/2013, 5:38 AM

## 2013-11-23 NOTE — Progress Notes (Signed)
Brief Nutrition Note  Consult received for enteral/tube feeding initiation and management.  Adult Enteral Nutrition Protocol initiated. Full assessment to follow.  Admitting Dx: Acute respiratory failure [518.81] COPD (chronic obstructive pulmonary disease) [496] Pneumonia [486] CAP (community acquired pneumonia) [486]  Body mass index is 23.84 kg/(m^2). Pt meets criteria for normal weight based on current BMI.  Labs:   Recent Labs Lab 11/22/13 1145 11/23/13 0115  NA 128* 130*  K 4.2 4.2  CL 84* 90*  CO2 32 32  BUN 6 5*  CREATININE 0.39* 0.41*  CALCIUM 9.0 8.0*  GLUCOSE 102* 130*    Molli Barrows, RD, LDN, CNSC Pager [phone] After Hours Pager [phone]

## 2013-11-23 NOTE — Plan of Care (Signed)
RN paged- pt with progressive resp distress and hypoxia- chart reviewed- PCXR with increased bibasilar infiltrates which have an interstitial pneumonitis appearance some of which is chronic but does raise concern for viral process. ABG revealed profound hypercarbia with PCO2 too high to read (pt has underlying COPD)-she was reported as having wheezing so will add Solumedrol (has Prednisone listed as allergy but reaction was GIB) although wheezing could be from acute edema so will also give 1x dose Lasix. She is spiking fever up to 102 and since she is very lethargic will give a 1x dose Toradol IV. Updated daughter by phone of current findings and CT Chest results. Pt is DNR so would not pursue further aggressive care and will monitor clinically.  Addendum 222 am: Now pt hypotensive with SBP 80s- only 600 out with Lasix-concerned that BP drop could be related to sepsis esp since onset after temp spike -PCT pending- ck stat cortisol then start Solucortef-may need to give back fluid diuresed off-HR better though since placed on BiPAP  Addendum 235 am: Spoke with Dr. Arnoldo Morale- agree pt looking more c/w severe sepsis and agrees with above measures- also may have aspirated as mentation worsened so recommended broaden anbx coverage and notify PCCM. Also family is now saying pt is full code-will change to ICU status due to degree of nursing care required and possible escalation of care based on recent discussion with the family. I reviewed the patient's advance directives which state to only with hold care if her condition is irreversible and at this time she has an ACUTE change in baseline health. Family feels that pt presented hypoxic with altered menatation and this may have influenced her decision to change to DNR status.  Erin Hearing, ANP Reviewed and Agree with the Above Assessment

## 2013-11-23 NOTE — Procedures (Signed)
Central Venous Catheter Insertion Procedure Note Jill White [id number] Jan 03, 1941  Procedure: Insertion of Central Venous Catheter Indications: Assessment of intravascular volume, Drug and/or fluid administration and Frequent blood sampling  Procedure Details Consent: Risks of procedure as well as the alternatives and risks of each were explained to the (patient/caregiver).  Consent for procedure obtained. Time Out: Verified patient identification, verified procedure, site/side was marked, verified correct patient position, special equipment/implants available, medications/allergies/relevent history reviewed, required imaging and test results available.  Performed  Maximum sterile technique was used including antiseptics, cap, gloves, gown, hand hygiene, mask and sheet. Skin prep: Chlorhexidine; local anesthetic administered A antimicrobial bonded/coated triple lumen catheter was placed in the left internal jugular vein using the Seldinger technique. Procedure done under direct visualization with ultrasound.   Evaluation Blood flow good Complications: No apparent complications Patient did tolerate procedure well. Chest X-ray ordered to verify placement.  CXR: normal.  [name] 11/23/2013, 6:03 AM

## 2013-11-23 NOTE — Procedures (Signed)
Intubation Procedure Note Jill White [id number] 02/02/1941  Procedure: Intubation Indications: Respiratory insufficiency  Procedure Details Consent: Risks of procedure as well as the alternatives and risks of each were explained to the (patient/caregiver).  Consent for procedure obtained. Time Out: Verified patient identification, verified procedure, site/side was marked, verified correct patient position, special equipment/implants available, medications/allergies/relevent history reviewed, required imaging and test results available.  Performed  Medications used: etomidate and succinylcholine Equipment used: Glidescope Grade I View Correct placement confirmed by by auscultation, by CXR and ETCO2 monitor Tube secured at 23 cm at the lip  Evaluation Hemodynamic Status: BP stable throughout; O2 sats: stable throughout Patient's Current Condition: stable Complications: No apparent complications Patient did tolerate procedure well. Chest X-ray ordered to verify placement.  CXR: tube position acceptable.   Jill White, M.D. Pulmonary and Critical Care Medicine Call [name] with questions [phone] 11/23/2013

## 2013-11-23 NOTE — Procedures (Signed)
Arterial Catheter Insertion Procedure Note NYREE APPLEGATE [id number] April 30, 1941  Procedure: Insertion of Arterial Catheter  Indications: Blood pressure monitoring and Frequent blood sampling  Procedure Details Consent: Risks of procedure as well as the alternatives and risks of each were explained to the (patient/caregiver).  Consent for procedure obtained. Time Out: Verified patient identification, verified procedure, site/side was marked, verified correct patient position, special equipment/implants available, medications/allergies/relevent history reviewed, required imaging and test results available.  Performed  Maximum sterile technique was used including antiseptics, cap, gloves, gown, hand hygiene, mask and sheet. Skin prep: Chlorhexidine; local anesthetic administered 20 gauge catheter was inserted into left radial artery using the Seldinger technique.  Evaluation Blood flow good; BP tracing good. Complications: No apparent complications.   Rush Farmer 11/23/2013

## 2013-11-23 NOTE — Progress Notes (Signed)
ANTIBIOTIC CONSULT NOTE - Follow up  Pharmacy Consult for vancomycin and cefepime Indication: pneumonia  Allergies  Allergen Reactions  . Tetanus Toxoid Anaphylaxis  . Codeine Nausea And Vomiting and Other (See Comments)    Itching and faint   . Erythromycin Nausea And Vomiting  . Meloxicam Itching  . Methylprednisolone Hives  . Penicillins Hives  . Prednisone Other (See Comments)    GI bleed    Patient Measurements: Weight: 55.8kg Adjusted Body Weight: 52.4kg  Assessment: Jill White admitted 6/5 from PCP office with hypoxia, dry and productive cough, and subjective fevers. Pt with hx COPD. Pharmacy originally consulted to dose levofloxacin and vancomycin for suspected pneumonia. Levofloxacin changed to cefepime overnight 6/6 with acute respiratory distress requiring trasfer to the ICU and emergent intubation.  Antiinfectives 6/5 >> levofloxacin >> 6/6 6/5 >> vancomycin >>  6/6 >> cefepime>>    Labs / vitals Tmax: 102 WBCs: 12.3 Renal: SCr 0.4 (baseline 0.4-0.5), CrCl 45 ml/min CG, 69 ml/min normalized (both using SCr=0.8) Lactic acid: 1.51 (6/5), 0.9 (6/6)  Microbiology 6/5 blood x2: sent 6/5 S pneumo Ur Ag: negative 6/5 Legionella ur Ag: in process 6/5 urine: sent 6/5 MRSA PCR: negative 6/6 resp virus panel: in process 6/6 resp Cx: sent   Goal of Therapy:  Vancomycin trough level 15-20 mcg/ml cefepime per indication and renal function  Plan:  - continue vancomycin 500mg  IV q12h - agree with cefepime 2g IV x1 - start cefepime 1g IV q12h at 1600 - vancomycin trough at steady state if indicated - follow-up clinical course, culture results, renal function - follow-up antibiotic de-escalation and length of therapy  Thank you for the consult.  Johny Drilling, PharmD, BCPS Pager: [phone] Pharmacy: [phone] 11/23/2013 12:48 PM

## 2013-11-23 NOTE — Progress Notes (Addendum)
11/23/13 1500  PT Visit Information  Last PT Received On 11/23/13  Reason Eval/Treat Not Completed Medical issues which prohibited therapy Ventilated, sedated, see Monday

## 2013-11-23 NOTE — Progress Notes (Signed)
Echocardiogram 2D Echocardiogram has been performed.  Doyle Askew 11/23/2013, 8:58 AM

## 2013-11-23 NOTE — Plan of Care (Signed)
Patient seen, overnight events noted, currently intubated, PCCM will assume care until in ICU. I will sign off.    Zierra Laroque Krystal Eaton M.D. [facility] 11/23/2013, 7:43 AM  Pager: [phone]

## 2013-11-23 NOTE — Procedures (Signed)
Arterial Catheter Insertion Procedure Note Jill White [id number] June 09, 1941  Procedure: Insertion of Arterial Catheter  Indications: Blood pressure monitoring and Frequent blood sampling  Procedure Details Consent: Risks of procedure as well as the alternatives and risks of each were explained to the (patient/caregiver).  Consent for procedure obtained. Time Out: Verified patient identification, verified procedure, site/side was marked, verified correct patient position, special equipment/implants available, medications/allergies/relevent history reviewed, required imaging and test results available.  Performed  Maximum sterile technique was used including antiseptics, cap, gloves, gown, hand hygiene, mask and sheet. Skin prep: Chlorhexidine; local anesthetic administered 20 gauge catheter was inserted into right radial artery using the Seldinger technique.  Evaluation Blood flow good; BP tracing good. Complications: No apparent complications.   Clinton Sawyer Niel Peretti 11/23/2013

## 2013-11-23 NOTE — Progress Notes (Signed)
Rx Brief Antibiotic note:  Cefepime  Assessement:  Pt with worsening respiratory distress/hypoxia  D/c Levaquin, start Cefepime per Rx  Pt with PCN allergy, but tolerated Rocephin b/f  Plan:  Cefepime 2gm IV q24h  F/u SCr/cultures as needed  Dorrene German 11/23/2013 2:56 AM

## 2013-11-24 ENCOUNTER — Inpatient Hospital Stay (HOSPITAL_COMMUNITY): Payer: Medicare Other

## 2013-11-24 LAB — BLOOD GAS, ARTERIAL
ACID-BASE EXCESS: 4.4 mmol/L — AB (ref 0.0–2.0)
Acid-Base Excess: 1.9 mmol/L (ref 0.0–2.0)
BICARBONATE: 27.2 meq/L — AB (ref 20.0–24.0)
BICARBONATE: 27.4 meq/L — AB (ref 20.0–24.0)
Drawn by: 308601
Drawn by: 308601
FIO2: 0.4 %
FIO2: 0.4 %
MECHVT: 450 mL
MECHVT: 450 mL
O2 SAT: 98.4 %
O2 Saturation: 98.7 %
PATIENT TEMPERATURE: 35.4
PCO2 ART: 47 mmHg — AB (ref 35.0–45.0)
PEEP/CPAP: 8 cmH2O
PEEP: 8 cmH2O
PH ART: 7.378 (ref 7.350–7.450)
PO2 ART: 92.6 mmHg (ref 80.0–100.0)
Patient temperature: 36
RATE: 28 resp/min
RATE: 20 resp/min
TCO2: 24.1 mmol/L (ref 0–100)
TCO2: 25 mmol/L (ref 0–100)
pCO2 arterial: 32.3 mmHg — ABNORMAL LOW (ref 35.0–45.0)
pH, Arterial: 7.527 — ABNORMAL HIGH (ref 7.350–7.450)
pO2, Arterial: 121 mmHg — ABNORMAL HIGH (ref 80.0–100.0)

## 2013-11-24 LAB — LEGIONELLA ANTIGEN, URINE: LEGIONELLA ANTIGEN, URINE: NEGATIVE

## 2013-11-24 LAB — BASIC METABOLIC PANEL
BUN: 11 mg/dL (ref 6–23)
CHLORIDE: 90 meq/L — AB (ref 96–112)
CO2: 26 meq/L (ref 19–32)
CREATININE: 0.36 mg/dL — AB (ref 0.50–1.10)
Calcium: 8.4 mg/dL (ref 8.4–10.5)
GFR calc non Af Amer: >90 mL/min (ref >90)
Glucose, Bld: 166 mg/dL — ABNORMAL HIGH (ref 70–99)
POTASSIUM: 3.6 meq/L — AB (ref 3.7–5.3)
Sodium: 129 mEq/L — ABNORMAL LOW (ref 137–147)

## 2013-11-24 LAB — URINE CULTURE: Colony Count: 30000

## 2013-11-24 LAB — GLUCOSE, CAPILLARY
Glucose-Capillary: 105 mg/dL — ABNORMAL HIGH (ref 70–99)
Glucose-Capillary: 123 mg/dL — ABNORMAL HIGH (ref 70–99)
Glucose-Capillary: 127 mg/dL — ABNORMAL HIGH (ref 70–99)
Glucose-Capillary: 147 mg/dL — ABNORMAL HIGH (ref 70–99)
Glucose-Capillary: 83 mg/dL (ref 70–99)
Glucose-Capillary: 96 mg/dL (ref 70–99)

## 2013-11-24 LAB — CBC
HCT: 36.1 % (ref 36.0–46.0)
Hemoglobin: 12.4 g/dL (ref 12.0–15.0)
MCH: 32.5 pg (ref 26.0–34.0)
MCHC: 34.3 g/dL (ref 30.0–36.0)
MCV: 94.8 fL (ref 78.0–100.0)
Platelets: 272 10*3/uL (ref 150–400)
RBC: 3.81 MIL/uL — AB (ref 3.87–5.11)
RDW: 14.4 % (ref 11.5–15.5)
WBC: 10.3 10*3/uL (ref 4.0–10.5)

## 2013-11-24 LAB — PHOSPHORUS: Phosphorus: 2.5 mg/dL (ref 2.3–4.6)

## 2013-11-24 LAB — MAGNESIUM: Magnesium: 1.7 mg/dL (ref 1.5–2.5)

## 2013-11-24 LAB — CORTISOL: Cortisol, Plasma: 141 ug/dL

## 2013-11-24 MED ORDER — POTASSIUM CHLORIDE CRYS ER 20 MEQ PO TBCR
40.0000 meq | EXTENDED_RELEASE_TABLET | Freq: Three times a day (TID) | ORAL | Status: AC
  Administered 2013-11-24: 40 meq via ORAL
  Filled 2013-11-24: qty 2

## 2013-11-24 NOTE — Procedures (Signed)
Extubation Procedure Note  Patient Details:   Name: Jill White DOB: [birth year] MRN: [id number]   Airway Documentation:  Airway 7.5 mm (Active)  Secured at (cm) 23 cm 11/24/2013  8:34 AM  Measured From Lips 11/24/2013  8:34 AM  Secured Location Left 11/24/2013  8:34 AM  Secured By [employer] 11/24/2013  8:34 AM  Tube Holder Repositioned Yes 11/24/2013  8:34 AM  Cuff Pressure (cm H2O) 22 cm H2O 11/23/2013  7:40 PM    Evaluation  O2 sats: stable throughout Complications: No apparent complications Patient did tolerate procedure well. Bilateral Breath Sounds: Diminished   Yes Pt extubated to 4 L [place] per MD order.  [facility] 11/24/2013, 9:48 AM

## 2013-11-24 NOTE — Progress Notes (Signed)
INITIAL NUTRITION ASSESSMENT  DOCUMENTATION CODES Per approved criteria  -Not Applicable   INTERVENTION: - Encourage adequate intake of Heart Healthy diet.  - RD will monitor intake and add nutrition supplements as needed.   NUTRITION DIAGNOSIS: Predicted suboptimal energy intake related to respiratory status as evidenced by recently intubated.   Goal: Patient will meet >/=90% of estimated nutrition needs  Monitor:  PO intake, weight, labs  Reason for Assessment: Consult, TF  73 y.o. female  Admitting Dx: Acute respiratory failure  ASSESSMENT: 73 year old female with history of COPD, hypertension, presented to the ER with shortness of breath and wheezing.  - Patient was intubated 6/6, and consult received for enteral nutrition. Saturday RD started Vital High Protein at goal rate of 40 ml/hr per adult enteral nutrition protocol.  - Patient extubated this AM. TF discontinued. Diet advanced to Heart Healthy, to be started at 2pm - Patient reports that her appetite was good prior to admission. She denies weight loss. No signs of muscle or subcutaneous fat wasting.   Height: Ht Readings from Last 1 Encounters:  11/23/13 5\' 1"  (1.549 m)    Weight: Wt Readings from Last 1 Encounters:  11/23/13 126 lb 1.7 oz (57.2 kg)    Ideal Body Weight: 105 pounds  % Ideal Body Weight: 120%  Wt Readings from Last 10 Encounters:  11/23/13 126 lb 1.7 oz (57.2 kg)  11/22/13 123 lb (55.792 kg)  09/06/13 129 lb (58.514 kg)  08/23/13 128 lb (58.06 kg)  08/09/13 130 lb (58.968 kg)  07/23/13 130 lb (58.968 kg)  06/24/13 134 lb (60.782 kg)  04/29/13 128 lb 8 oz (58.287 kg)  04/29/13 128 lb (58.06 kg)  07/03/12 128 lb (58.06 kg)    Usual Body Weight: 125-130 pounds  % Usual Body Weight: 100%  BMI:  Body mass index is 23.84 kg/(m^2). Patient is normal weight.   Estimated Nutritional Needs: Kcal: 1200-1300 kcal Protein: 70-80 g Fluid: >1.7 L/day  Skin: Intact  Diet Order:  Cardiac  EDUCATION NEEDS: -No education needs identified at this time   Intake/Output Summary (Last 24 hours) at 11/24/13 1056 Last data filed at 11/24/13 0800  Gross per 24 hour  Intake   2326 ml  Output   1150 ml  Net   1176 ml    Last BM: PTA   Labs:   Recent Labs Lab 11/22/13 1145 11/23/13 0115 11/24/13 0500  NA 128* 130* 129*  K 4.2 4.2 3.6*  CL 84* 90* 90*  CO2 32 32 26  BUN 6 5* 11  CREATININE 0.39* 0.41* 0.36*  CALCIUM 9.0 8.0* 8.4  MG  --   --  1.7  PHOS  --   --  2.5  GLUCOSE 102* 130* 166*    CBG (last 3)   Recent Labs  11/23/13 2051 11/24/13 0026 11/24/13 0956  GLUCAP 151* 147* 127*    Scheduled Meds: . antiseptic oral rinse  15 mL Mouth Rinse QID  . atropine  0.4 mg Intravenous Once  . budesonide  0.25 mg Nebulization BID  . ceFEPime (MAXIPIME) IV  1 g Intravenous Q12H  . chlorhexidine  15 mL Mouth Rinse BID  . clonazePAM  1 mg Oral QHS  . enoxaparin (LOVENOX) injection  40 mg Subcutaneous Q24H  . feeding supplement (VITAL HIGH PROTEIN)  1,000 mL Per Tube Q24H  . guaiFENesin  1,200 mg Oral BID  . ipratropium-albuterol  3 mL Nebulization Q4H  . nicotine  21 mg Transdermal Daily  .  pantoprazole (PROTONIX) IV  40 mg Intravenous QHS  . potassium chloride  40 mEq Oral TID  . vancomycin  500 mg Intravenous Q12H    Continuous Infusions: . sodium chloride 10 mL/hr at 11/23/13 1323  . fentaNYL infusion INTRAVENOUS Stopped (11/24/13 0940)  . midazolam (VERSED) infusion Stopped (11/24/13 0940)  . phenylephrine (NEO-SYNEPHRINE) Adult infusion 35 mcg/min (11/24/13 0354)    Past Medical History  Diagnosis Date  . HIP PAIN 03/06/2007  . HYPERTENSION 02/09/2007  . Restless leg syndrome   . COPD (chronic obstructive pulmonary disease)   . Complication of anesthesia   . Ulcer     gastric antrum     Past Surgical History  Procedure Laterality Date  . Abdominal hysterectomy    . Dilation and curettage of uterus    . Tonsillectomy    .  Esophagogastroduodenoscopy  07/09/2011    Procedure: ESOPHAGOGASTRODUODENOSCOPY (EGD);  Surgeon: Missy Sabins, MD;  Location: [facility];  Service: Endoscopy;  Laterality: N/A;  patient in room 1532  . Cataract extraction w/ intraocular lens  implant, bilateral Bilateral 03/2013    Patient claims it was within the month.     Larey Seat, RD, LDN Pager #: [phone] After-Hours Pager #: [phone]

## 2013-11-24 NOTE — Progress Notes (Signed)
PULMONARY / CRITICAL CARE MEDICINE   Name: Jill White MRN: [id number] DOB: [birth year]    ADMISSION DATE:  11/22/2013 CONSULTATION DATE:  11/23/13  REFERRING MD :  Dr. Arnoldo Morale PRIMARY SERVICE: [facility]  CHIEF COMPLAINT:   Acute hypercarbic and hypoxemic respiratory failure, hypotension  BRIEF PATIENT DESCRIPTION:  73 years old female with PMH relevant for HTN and COPD. Admitted under [facility] car with respiratory distress and hypoxemia to the 70's. Initially more stable but now progressed to severe hypercarbic and hypoxemic respiratory failure requiring intubation and mechanical ventilation. Chest X ray with worsening infiltrates.   SIGNIFICANT EVENTS / STUDIES:  Intubation 11/23/13  LINES / TUBES: Left IJ CVC6/6>>> ETT 6/6>>> Foley catheter 6/6>>> L radial a-line 6/6>>>  CULTURES: Blood cultures 6/6>>> Tracheal aspirate 6/6>>> Urine culture 6/6>>>  ANTIBIOTICS: Cefepime 6/6>>> Levaquin 6/6>>> Vancomycin 6/6>>>  SUBJECTIVE: Much more alert and interactive this AM.  VITAL SIGNS: Temp:  [95.4 F (35.2 C)-99 F (37.2 C)] 97 F (36.1 C) (06/07 0900) Pulse Rate:  [49-94] 64 (06/07 0900) Resp:  [18-30] 20 (06/07 0900) BP: (102-128)/(50-58) 128/57 mmHg (06/07 0405) SpO2:  [95 %-100 %] 99 % (06/07 0900) FiO2 (%):  [40 %-60 %] 40 % (06/07 0834)  HEMODYNAMICS: CVP:  [7 mmHg-21 mmHg] 10 mmHg  VENTILATOR SETTINGS: Vent Mode:  [-] PRVC FiO2 (%):  [40 %-60 %] 40 % Set Rate:  [20 bmp-280 bmp] 20 bmp Vt Set:  [450 mL] 450 mL PEEP:  [8 cmH20] 8 cmH20 Plateau Pressure:  [20 cmH20-22 cmH20] 22 cmH20  INTAKE / OUTPUT: Intake/Output     06/06 0701 - 06/07 0700 06/07 0701 - 06/08 0700   I.V. (mL/kg) 1877 (32.8)    NG/GT 510 30   IV Piggyback 300    Total Intake(mL/kg) 2687 (47) 30 (0.5)   Urine (mL/kg/hr) 1430 (1)    Total Output 1430     Net +1257 +30         PHYSICAL EXAMINATION: General: Alert and interactive. Eyes: Anicteric sclerae. Pupils  are equal and reactive to light. ENT: ETT in place. Trachea at midline.  Lymph: No cervical, supraclavicular, or axillary lymphadenopathy. Heart: Normal S1, S2. No murmurs, rubs, or gallops appreciated. No bruits, equal pulses. Lungs: Bilateral diffuse crackles. No wheezing. Abdomen: Abdomen soft, non-tender and not distended, normoactive bowel sounds. No hepatosplenomegaly or masses. Musculoskeletal: No clubbing or synovitis. No LE edema Skin: No rashes or lesions Neuro: Awake, following commands and writing notes.  LABS:  CBC  Recent Labs Lab 11/22/13 1145 11/23/13 0115 11/24/13 0500  WBC 13.2* 12.3* 10.3  HGB 14.4 12.5 12.4  HCT 41.6 38.1 36.1  PLT 303 261 272   Coag's No results found for this basename: APTT, INR,  in the last 168 hours BMET  Recent Labs Lab 11/22/13 1145 11/23/13 0115 11/24/13 0500  NA 128* 130* 129*  K 4.2 4.2 3.6*  CL 84* 90* 90*  CO2 32 32 26  BUN 6 5* 11  CREATININE 0.39* 0.41* 0.36*  GLUCOSE 102* 130* 166*   Electrolytes  Recent Labs Lab 11/22/13 1145 11/23/13 0115 11/24/13 0500  CALCIUM 9.0 8.0* 8.4  MG  --   --  1.7  PHOS  --   --  2.5   Sepsis Markers  Recent Labs Lab 11/22/13 1156 11/23/13 0046 11/23/13 0620  LATICACIDVEN 1.51  --  0.9  PROCALCITON  --  <0.10  --    ABG  Recent Labs Lab 11/23/13 1029 11/24/13 0402 11/24/13 0550  PHART 7.424 7.527* 7.378  PCO2ART 41.5 32.3* 47.0*  PO2ART 115.0* 92.6 121.0*   Liver Enzymes  Recent Labs Lab 11/22/13 1145 11/23/13 0115  AST 28 45*  ALT 19 28  ALKPHOS 85 74  BILITOT 0.4 0.2*  ALBUMIN 3.8 2.9*   Cardiac Enzymes  Recent Labs Lab 11/22/13 1145 11/23/13 0620  TROPONINI  --  <0.30  PROBNP 4145.0*  --    Glucose  Recent Labs Lab 11/23/13 1627 11/23/13 2051 11/24/13 0026  GLUCAP 138* 151* 147*   Imaging Dg Chest 2 View  11/22/2013   CLINICAL DATA:  Productive cough. Shortness of breath. COPD. Hypertension.  EXAM: CHEST  2 VIEW  COMPARISON:   06/24/2013  FINDINGS: Changes of COPD are again seen. No evidence of acute infiltrate or pulmonary edema. No evidence of pleural effusion.  Mild cardiomegaly is stable. No evidence of congestive heart failure. No mass or lymphadenopathy identified. No significant change compared to prior exam.  IMPRESSION: Stable COPD and mild cardiomegaly.  No active lung disease.   Electronically Signed   By: Earle Gell M.D.   On: 11/22/2013 11:35   Ct Angio Chest Pe W/cm &/or Wo Cm  11/22/2013   CLINICAL DATA:  Cough, shortness of breath and fever and hypoxia ; history of COPD and tobacco use  EXAM: CT ANGIOGRAPHY CHEST WITH CONTRAST  TECHNIQUE: Multidetector CT imaging of the chest was performed using the standard protocol during bolus administration of intravenous contrast. Multiplanar CT image reconstructions and MIPs were obtained to evaluate the vascular anatomy.  CONTRAST:  185mL OMNIPAQUE IOHEXOL 350 MG/ML SOLN  COMPARISON:  PA and lateral chest x-ray of today's date  FINDINGS: There is respiratory motion artifact due to the patient's tachypnea. There are patchy interstitial infiltrates in both lower lobes and in the right middle lobe. There is mild bronchiectasis in the right lower lobe. There is a small right pleural effusion layering posteriorly. The cardiac chambers are mildly enlarged. The caliber of the thoracic aorta is normal. Contrast within the visualized portions of the pulmonary arterial tree is normal. There is an enlarged right hilar lymph node measuring 2.3 cm in diameter. A subcarinal lymph node measures 1.3 cmin short axis. A left hilar lymph node measures 0.8 cm in short axis.  Within the upper abdomen the observed portions of the liver and spleen and adrenal glands exhibit no acute abnormalities.  The thoracic vertebral bodies are preserved in height. Anterior endplate osteophytes are present at multiple levels. No acute rib abnormality is demonstrated.  Review of the MIP images confirms the above  findings.  IMPRESSION: 1. Bibasilar pneumonia is present with small right pleural effusion. There is underlying COPD. 2. There are mildly enlarged mediastinal and right hilar lymph nodes. 3. There is mild cardiac enlargement without evidence of pulmonary edema. The observed portions of the pulmonary arterial tree reveal no emboli.   Electronically Signed   By: David  Martinique   On: 11/22/2013 21:56   Dg Chest Port 1 View  11/24/2013   CLINICAL DATA:  Intubated.  EXAM: PORTABLE CHEST - 1 VIEW  COMPARISON:  11/23/2013.  FINDINGS: The cardiac silhouette, mediastinal and hilar contours are within normal limits and stable. There is moderate tortuosity and calcification of the thoracic aorta. The endotracheal tube, NG tube and left IJ catheters are stable. The heart and lungs are unchanged. There is persistent interstitial edema and areas of atelectasis. No definite pleural effusions. Prominent skin fold noted over the right chest.  IMPRESSION: 1. Stable support  apparatus. 2. Persistent interstitial edema and areas of atelectasis.   Electronically Signed   By: Kalman Jewels M.D.   On: 11/24/2013 06:11   Dg Chest Port 1 View  11/23/2013   CLINICAL DATA:  Endotracheal tube placement and central line placement.  EXAM: PORTABLE CHEST - 1 VIEW  COMPARISON:  11/23/2013.  FINDINGS: The endotracheal tube is 4.5 cm above the carina. The left IJ central venous catheter tip is in the distal SVC. The lungs show slight improved aeration status post intubation.  IMPRESSION: The endotracheal tube is in good position, 4.5 cm above the carina.  Left IJ central venous catheter tip is in the distal SVC.  Slight improved lung aeration.   Electronically Signed   By: Kalman Jewels M.D.   On: 11/23/2013 04:54   Dg Chest Port 1 View  11/23/2013   CLINICAL DATA:  Respiratory distress.  Hypoxia.  EXAM: PORTABLE CHEST - 1 VIEW  COMPARISON:  11/22/2013.  FINDINGS: The heart is enlarged. There is tortuosity and calcification of the thoracic  aorta. Interval development of fulminant pulmonary edema. Small right pleural effusion.  IMPRESSION: CHF with fulminant pattern of pulmonary edema.   Electronically Signed   By: Kalman Jewels M.D.   On: 11/23/2013 00:44   Dg Abd Portable 1v  11/23/2013   CLINICAL DATA:  Insertion of orogastric tube.  EXAM: PORTABLE ABDOMEN - 1 VIEW  COMPARISON:  CT 07/06/2011  FINDINGS: Enteric tube is seen with tip and side-port overlying the stomach in the left upper quadrant. Bowel gas pattern is nonobstructive. There is mild curvature of the thoracolumbar spine convex to the right. There is moderate to severe degenerative change of the spine. There are mild degenerative changes of the hips and sacroiliac joints.  IMPRESSION: Nonobstructive bowel gas pattern.  Orogastric tube with tip and sideport over the stomach in the left upper quadrant.   Electronically Signed   By: Marin Olp M.D.   On: 11/23/2013 16:15   CXR:  Increased bilateral patchy infiltrates, pulmonary edema vs bilateral pneumonia.   ASSESSMENT / PLAN:  PULMONARY A: 1) Acute hypoxemic and hypercarbic respiratory failure.  2) Fever, elevated WBC and worsening infiltrates suggests pneumonia.  P:   - SBT to extubate today. - VAP prevention order set - Dunonebs q4 hrs - Albuterol PRN - Antibiotics on ID section - Respiratory viral panel pending. - Will need to discuss plan of care post extubation.  CARDIOVASCULAR A:  1) Hypotension likely related to septic shock.  2) Last echocardiogram from 2014 with normal LVEF but diastolic dysfunction and moderate PAH. BNP 4145 P:  - On Neo at 30 mcg.  Will attempt to wean. - Echocardiogram EF 45-50% and PAP 48. - Lactic acid 0.9. - Troponin noted. - Got lasix before intubation, will hold for now given hypotension. - Follow CVP.  RENAL A:   1) Normal creatinine P:   - Will follow chemistry. - Replace electrolytes as needed. - Hold lasix.  GASTROINTESTINAL A:   1) No issues P:   - GI  prophylaxis with IV protonix. - Hold TF for extubation. - Start diet 4 hours post extubation.  HEMATOLOGIC A:  1) No issues P:  - DVT prophylaxis with SQ heparin  INFECTIOUS A:   1) Possible multilobar pneumonia P:   - Cefepime - Levaquin off. - Vancomycin - F/U on culture.  ENDOCRINE A:   1) No issues   P:   - CBG. - D/C stress dose steroids.  NEUROLOGIC A:  1) No issues 2) Did not tolerate propofol because of hypotension and severe bradycardia. 3)  P:   - D/C sedation. - Monitor.  TODAY'S SUMMARY: Extubate today, continue neo for BP support as needed, KVO IVF, hold diureses for now, smoking cessation and monitor.  I have personally obtained a history, examined the patient, evaluated laboratory and imaging results, formulated the assessment and plan and placed orders.  CRITICAL CARE: The patient is critically ill with multiple organ systems failure and requires high complexity decision making for assessment and support, frequent evaluation and titration of therapies, application of advanced monitoring technologies and extensive interpretation of multiple databases. Critical Care Time devoted to patient care services described in this note is 35 minutes.   Rush Farmer, M.D. [facility] Pulmonary/Critical Care Medicine. Pager: [phone]. After hours pager: [phone].  11/24/2013, 9:37 AM

## 2013-11-24 NOTE — Progress Notes (Signed)
184ml of Fentanyl from a 271ml bag of 15mcg/ml wasted in sink with Willia Craze RN

## 2013-11-24 NOTE — Progress Notes (Signed)
eLink Physician-Brief Progress Note Patient Name: Jill White DOB: 1940/07/04 MRN: [id number]  Date of Service  11/24/2013   HPI/Events of Note  Respiratory alkalosis on vent.   eICU Interventions  Plan: Decrease RR on vent from 28 to 20 ABG 1 hour post vent change   Intervention Category Major Interventions: Acid-Base disturbance - evaluation and management  Guadelupe Sabin Deterding 11/24/2013, 4:20 AM

## 2013-11-25 ENCOUNTER — Inpatient Hospital Stay (HOSPITAL_COMMUNITY): Payer: Medicare Other

## 2013-11-25 DIAGNOSIS — J189 Pneumonia, unspecified organism: Secondary | ICD-10-CM

## 2013-11-25 DIAGNOSIS — J9600 Acute respiratory failure, unspecified whether with hypoxia or hypercapnia: Secondary | ICD-10-CM

## 2013-11-25 DIAGNOSIS — J449 Chronic obstructive pulmonary disease, unspecified: Secondary | ICD-10-CM

## 2013-11-25 LAB — BLOOD GAS, ARTERIAL
ACID-BASE EXCESS: 5 mmol/L — AB (ref 0.0–2.0)
Acid-Base Excess: 2.2 mmol/L — ABNORMAL HIGH (ref 0.0–2.0)
Bicarbonate: 30.5 mEq/L — ABNORMAL HIGH (ref 20.0–24.0)
Bicarbonate: 18.2 mEq/L — ABNORMAL LOW (ref 20.0–24.0)
Bicarbonate: 30.4 mEq/L — ABNORMAL HIGH (ref 20.0–24.0)
DRAWN BY: 308601
Drawn by: 277551
Drawn by: 307971
FIO2: 1 %
LHR: 16 {breaths}/min
MECHVT: 500 mL
O2 CONTENT: 3 L/min
O2 Content: 3 L/min
O2 SAT: 94.6 %
O2 Saturation: 99.8 %
PATIENT TEMPERATURE: 37
PATIENT TEMPERATURE: 37.7
PATIENT TEMPERATURE: 37
PCO2 ART: 47.9 mmHg — AB (ref 35.0–45.0)
PEEP: 5 cmH2O
PO2 ART: 128 mmHg — AB (ref 80.0–100.0)
PO2 ART: 352 mmHg — AB (ref 80.0–100.0)
TCO2: 28.2 mmol/L (ref 0–100)
TCO2: 27.5 mmol/L (ref 0–100)
pCO2 arterial: 68.7 mmHg (ref 35.0–45.0)
pCO2 arterial: 51.2 mmHg — ABNORMAL HIGH (ref 35.0–45.0)
pH, Arterial: 7.27 — ABNORMAL LOW (ref 7.350–7.450)
pH, Arterial: 7.209 — ABNORMAL LOW (ref 7.350–7.450)
pH, Arterial: 7.392 (ref 7.350–7.450)
pO2, Arterial: 78.8 mmHg — ABNORMAL LOW (ref 80.0–100.0)

## 2013-11-25 LAB — RESPIRATORY VIRUS PANEL
Adenovirus: NOT DETECTED
INFLUENZA A H1: NOT DETECTED
INFLUENZA A H3: NOT DETECTED
Influenza A: NOT DETECTED
Influenza B: NOT DETECTED
Metapneumovirus: NOT DETECTED
Parainfluenza 1: NOT DETECTED
Parainfluenza 2: NOT DETECTED
Parainfluenza 3: DETECTED — AB
RESPIRATORY SYNCYTIAL VIRUS A: NOT DETECTED
Respiratory Syncytial Virus B: NOT DETECTED
Rhinovirus: NOT DETECTED

## 2013-11-25 LAB — CBC
HCT: 38.8 % (ref 36.0–46.0)
Hemoglobin: 12.6 g/dL (ref 12.0–15.0)
MCH: 32.3 pg (ref 26.0–34.0)
MCHC: 32.5 g/dL (ref 30.0–36.0)
MCV: 99.5 fL (ref 78.0–100.0)
Platelets: 258 10*3/uL (ref 150–400)
RBC: 3.9 MIL/uL (ref 3.87–5.11)
RDW: 15 % (ref 11.5–15.5)
WBC: 14.2 10*3/uL — AB (ref 4.0–10.5)

## 2013-11-25 LAB — BASIC METABOLIC PANEL
BUN: 7 mg/dL (ref 6–23)
CALCIUM: 8.6 mg/dL (ref 8.4–10.5)
CO2: 31 mEq/L (ref 19–32)
CREATININE: 0.39 mg/dL — AB (ref 0.50–1.10)
Chloride: 95 mEq/L — ABNORMAL LOW (ref 96–112)
GFR calc Af Amer: >90 mL/min (ref >90)
GLUCOSE: 98 mg/dL (ref 70–99)
Potassium: 4.4 mEq/L (ref 3.7–5.3)
SODIUM: 133 meq/L — AB (ref 137–147)

## 2013-11-25 LAB — CULTURE, RESPIRATORY W GRAM STAIN: Culture: NORMAL

## 2013-11-25 LAB — GLUCOSE, CAPILLARY
GLUCOSE-CAPILLARY: 94 mg/dL (ref 70–99)
GLUCOSE-CAPILLARY: 106 mg/dL — AB (ref 70–99)
Glucose-Capillary: 143 mg/dL — ABNORMAL HIGH (ref 70–99)

## 2013-11-25 LAB — PHOSPHORUS: Phosphorus: 2.5 mg/dL (ref 2.3–4.6)

## 2013-11-25 LAB — TRIGLYCERIDES: Triglycerides: 98 mg/dL (ref <150)

## 2013-11-25 LAB — CULTURE, RESPIRATORY

## 2013-11-25 LAB — PROCALCITONIN

## 2013-11-25 LAB — MAGNESIUM: MAGNESIUM: 1.8 mg/dL (ref 1.5–2.5)

## 2013-11-25 MED ORDER — ROCURONIUM BROMIDE 50 MG/5ML IV SOLN
INTRAVENOUS | Status: AC
  Filled 2013-11-25: qty 2

## 2013-11-25 MED ORDER — ARFORMOTEROL TARTRATE 15 MCG/2ML IN NEBU
15.0000 ug | INHALATION_SOLUTION | Freq: Two times a day (BID) | RESPIRATORY_TRACT | Status: DC
  Administered 2013-11-25 – 2013-12-03 (×12): 15 ug via RESPIRATORY_TRACT
  Filled 2013-11-25 (×24): qty 2

## 2013-11-25 MED ORDER — PANTOPRAZOLE SODIUM 40 MG PO TBEC
40.0000 mg | DELAYED_RELEASE_TABLET | Freq: Every day | ORAL | Status: DC
  Filled 2013-11-25: qty 1

## 2013-11-25 MED ORDER — MIDAZOLAM HCL 2 MG/2ML IJ SOLN
1.0000 mg | INTRAMUSCULAR | Status: DC | PRN
  Administered 2013-11-26: 1 mg via INTRAVENOUS

## 2013-11-25 MED ORDER — TIOTROPIUM BROMIDE MONOHYDRATE 18 MCG IN CAPS
18.0000 ug | ORAL_CAPSULE | Freq: Every day | RESPIRATORY_TRACT | Status: DC
  Filled 2013-11-25: qty 5

## 2013-11-25 MED ORDER — FENTANYL CITRATE 0.05 MG/ML IJ SOLN
50.0000 ug | INTRAMUSCULAR | Status: DC | PRN
  Administered 2013-11-25 – 2013-11-28 (×12): 50 ug via INTRAVENOUS
  Filled 2013-11-25 (×12): qty 2

## 2013-11-25 MED ORDER — LEVOFLOXACIN IN D5W 750 MG/150ML IV SOLN
750.0000 mg | INTRAVENOUS | Status: AC
  Administered 2013-11-25 – 2013-11-28 (×4): 750 mg via INTRAVENOUS
  Filled 2013-11-25 (×5): qty 150

## 2013-11-25 MED ORDER — ETOMIDATE 2 MG/ML IV SOLN
INTRAVENOUS | Status: AC
  Administered 2013-11-25: 20 mg
  Filled 2013-11-25: qty 20

## 2013-11-25 MED ORDER — LIDOCAINE HCL (CARDIAC) 20 MG/ML IV SOLN
INTRAVENOUS | Status: AC
  Filled 2013-11-25: qty 5

## 2013-11-25 MED ORDER — FENTANYL CITRATE 0.05 MG/ML IJ SOLN
25.0000 ug | Freq: Once | INTRAMUSCULAR | Status: AC
Start: 2013-11-25 — End: 2013-11-25
  Administered 2013-11-25: 25 ug via INTRAVENOUS

## 2013-11-25 MED ORDER — TIOTROPIUM BROMIDE MONOHYDRATE 18 MCG IN CAPS: 18.0000 ug | ORAL_CAPSULE | Freq: Every day | RESPIRATORY_TRACT | Status: DC

## 2013-11-25 MED ORDER — BUDESONIDE 0.5 MG/2ML IN SUSP
0.5000 mg | Freq: Two times a day (BID) | RESPIRATORY_TRACT | Status: DC
  Administered 2013-11-25 – 2013-12-03 (×15): 0.5 mg via RESPIRATORY_TRACT
  Filled 2013-11-25 (×22): qty 2

## 2013-11-25 MED ORDER — MIDAZOLAM HCL 2 MG/2ML IJ SOLN
INTRAMUSCULAR | Status: AC
  Administered 2013-11-25: 4 mg
  Filled 2013-11-25: qty 4

## 2013-11-25 MED ORDER — METHYLPREDNISOLONE SODIUM SUCC 125 MG IJ SOLR
80.0000 mg | Freq: Three times a day (TID) | INTRAMUSCULAR | Status: DC
  Administered 2013-11-25: 80 mg via INTRAVENOUS
  Filled 2013-11-25: qty 2

## 2013-11-25 MED ORDER — SUCCINYLCHOLINE CHLORIDE 20 MG/ML IJ SOLN
INTRAMUSCULAR | Status: AC
  Filled 2013-11-25: qty 1

## 2013-11-25 MED ORDER — PROPOFOL 10 MG/ML IV EMUL
INTRAVENOUS | Status: AC
  Filled 2013-11-25: qty 100

## 2013-11-25 MED ORDER — ARFORMOTEROL TARTRATE 15 MCG/2ML IN NEBU
15.0000 ug | INHALATION_SOLUTION | Freq: Two times a day (BID) | RESPIRATORY_TRACT | Status: DC
  Filled 2013-11-25 (×2): qty 2

## 2013-11-25 MED ORDER — PROPOFOL 10 MG/ML IV EMUL
0.0000 ug/kg/min | INTRAVENOUS | Status: DC
  Administered 2013-11-25: 40 ug/kg/min via INTRAVENOUS
  Administered 2013-11-25: 50 ug/kg/min via INTRAVENOUS
  Administered 2013-11-25: 30 ug/kg/min via INTRAVENOUS
  Administered 2013-11-25 – 2013-11-27 (×6): 50 ug/kg/min via INTRAVENOUS
  Administered 2013-11-27: 35 ug/kg/min via INTRAVENOUS
  Administered 2013-11-27: 40 ug/kg/min via INTRAVENOUS
  Administered 2013-11-27: 45 ug/kg/min via INTRAVENOUS
  Administered 2013-11-28: 14.343 ug/kg/min via INTRAVENOUS
  Filled 2013-11-25 (×10): qty 100

## 2013-11-25 MED ORDER — PANTOPRAZOLE SODIUM 40 MG PO PACK
40.0000 mg | PACK | Freq: Every day | ORAL | Status: DC
  Administered 2013-11-26 – 2013-11-27 (×2): 40 mg
  Filled 2013-11-25 (×4): qty 20

## 2013-11-25 MED ORDER — FENTANYL CITRATE 0.05 MG/ML IJ SOLN
INTRAMUSCULAR | Status: AC
  Administered 2013-11-25: 200 ug
  Filled 2013-11-25: qty 4

## 2013-11-25 MED ORDER — MIDAZOLAM HCL 2 MG/2ML IJ SOLN
1.0000 mg | INTRAMUSCULAR | Status: DC | PRN
  Administered 2013-11-26 – 2013-11-28 (×5): 1 mg via INTRAVENOUS
  Filled 2013-11-25 (×6): qty 2

## 2013-11-25 MED ORDER — BUDESONIDE 0.5 MG/2ML IN SUSP
0.5000 mg | Freq: Two times a day (BID) | RESPIRATORY_TRACT | Status: DC
Start: 2013-11-25 — End: 2013-11-25

## 2013-11-25 MED ORDER — METHYLPREDNISOLONE SODIUM SUCC 40 MG IJ SOLR
40.0000 mg | Freq: Three times a day (TID) | INTRAMUSCULAR | Status: DC
  Administered 2013-11-25 – 2013-11-26 (×2): 40 mg via INTRAVENOUS
  Filled 2013-11-25 (×2): qty 1

## 2013-11-25 NOTE — Progress Notes (Signed)
ANTIBIOTIC CONSULT NOTE - Follow up  Pharmacy Consult for vancomycin and cefepime --> levofloxacin Indication: pneumonia  Allergies  Allergen Reactions  . Tetanus Toxoid Anaphylaxis  . Codeine Nausea And Vomiting and Other (See Comments)    Itching and faint   . Erythromycin Nausea And Vomiting  . Meloxicam Itching  . Methylprednisolone Hives  . Penicillins Hives  . Prednisone Other (See Comments)    GI bleed    73 yoF admitted 6/5 from PCP office with hypoxia, dry and productive cough, and subjective fevers. Pt with hx COPD. Pharmacy has been consulted to dose levofloxacin and vancomycin for suspected pneumonia. Levofloxacin changed to cefepime overnight 6/6  6/5 >> levofloxacin >> 6/6, resume 6/8 >> 6/5 >> vancomycin >> 6/8 6/6 >> cefepime>> 6/8  Tmax: now low WBCs: elevated Renal: SCr 0.39 (baseline 0.4-0.5), CrCl 49 ml/min CG, 69 ml/min normalized (both using SCr=0.8), UOP good Lactic acid: 1.51 (6/5), 0.9 (6/6) PCT: < 0.1 (6/8)  6/5 blood x2: ngtd 6/5 S pneumo Ur Ag: negative 6/5 Legionella ur Ag: negative 6/5 urine: 30K multiple bacterial morphologies 6/5 MRSA PCR: negative 6/6 resp virus panel: in process 6/6 resp Cx: Nl flora  Goal of Therapy:  Levofloxacin per indication and renal function  Plan:   Levofloxacin 750mg  IV q24h based on current renal function  Doreene Eland, PharmD, BCPS.   Pager: [phone] 11/25/2013 12:16 PM

## 2013-11-25 NOTE — Progress Notes (Signed)
[id number] Rosana Hoes, RN, BSN, CCM  [phone]  Chart Reviewed for discharge and hospital needs.  Discharge needs at time of review: None present will follow for needs.  Review of patient progress due on [id number].

## 2013-11-25 NOTE — Progress Notes (Addendum)
[facility] Progress Note Patient Name: Jill White DOB: 21-Nov-1940 MRN: [id number]  Date of Service  11/25/2013   HPI/Events of Note   RN calling [facility]ink - family demanding abg due to concerns of hypercapnia  eICU Interventions   Camera exam: looks stable  plan Stat abg   Intervention Category Intermediate Interventions: Other:  Brand Males 11/25/2013, 6:08 AM

## 2013-11-25 NOTE — Progress Notes (Addendum)
[facility]Link Physician-Brief Progress Note Patient Name: Jill White DOB: [birth year] MRN: [id number]  Date of Service  11/25/2013   HPI/Events of Note    Recent Labs Lab 11/23/13 0520 11/23/13 0620 11/23/13 1029 11/24/13 0402 11/24/13 0550 11/25/13 0609  PHART 7.255*  --  7.424 7.527* 7.378 7.270*  PCO2ART 68.3*  --  41.5 32.3* 47.0* 68.7*  PO2ART 61.1*  --  115.0* 92.6 121.0* 78.8*  HCO3 29.3*  --  26.7* 27.2* 27.4* 30.5*  TCO2 27.0  --  23.9 24.1 25.0 28.2  O2SAT 88.7 78.1 98.9 98.4 98.7 94.6   Worsening hypercapnia; did get po ativan 1mg  13min earluer per RN  eICU Interventions  Start bipap DC;ed PAD protocol  Bedside MD to decide on ativan po prn dosing   Intervention Category Major Interventions: Acid-Base disturbance - evaluation and management  Jill White 11/25/2013, 6:28 AM

## 2013-11-25 NOTE — Progress Notes (Deleted)
CRITICAL VALUE ALERT  Critical value received:   K 2.9                                        Ca 5.8  Date of notification:  11/25/13  Time of notification:  0420  Critical value read back:yes  Nurse who received alert:  Darrin Nipper, RN  MD notified (1st page): [facility]  Time of first page: 0425

## 2013-11-25 NOTE — Progress Notes (Signed)
Per Richardson Landry Minor remove pt from bipap due to agitation

## 2013-11-25 NOTE — Progress Notes (Signed)
PULMONARY / CRITICAL CARE MEDICINE   Name: Jill White MRN: [id number] DOB: 05/31/1941    ADMISSION DATE:  11/22/2013 CONSULTATION DATE:  11/23/13  REFERRING MD :  Dr. Arnoldo Morale  CHIEF COMPLAINT:  Short of breath  BRIEF PATIENT DESCRIPTION:  73 yo female smoker admitted with AECOPD.  Intubated 6/06 and PCCM assumed care in ICU.  SIGNIFICANT EVENTS: 6/05 Admit 6/06 VDRF  STUDIES:  Echo 6/06 >> EF 45 to 50%, severe LVH, mod AS, mod/severe LA dilation, PAAS 48 mmHg  LINES / TUBES: Left IJ CVC6/6>>> ETT 6/6>>>6/7 Foley catheter 6/6>>> L radial a-line 6/6>>>out  CULTURES: Blood cultures 6/6>>> Tracheal aspirate 6/6>>>oral flora Urine culture 6/6>>>neg  ANTIBIOTICS: Cefepime 6/6>>>6/8 Levaquin 6/6>>>off Vancomycin 6/6>>>6/8 Levaquin 6/08 >>>  SUBJECTIVE:  Increased wob, unable to tolerate NIMVS, Check abg on O2.  VITAL SIGNS: Temp:  [97.2 F (36.2 C)-100.3 F (37.9 C)] 99 F (37.2 C) (06/08 0800) Pulse Rate:  [56-109] 97 (06/08 0700) Resp:  [10-32] 22 (06/08 0700) BP: (93-163)/(31-84) 125/52 mmHg (06/08 0700) SpO2:  [86 %-100 %] 93 % (06/08 1006) FiO2 (%):  [40 %] 40 % (06/08 0640) Weight:  [58.1 kg (128 lb 1.4 oz)] 58.1 kg (128 lb 1.4 oz) (06/08 0500)  HEMODYNAMICS: CVP:  [8 mmHg-17 mmHg] 14 mmHg  VENTILATOR SETTINGS: Vent Mode:  [-]  FiO2 (%):  [40 %] 40 % Set Rate:  [18 bmp] 18 bmp PEEP:  [5 cmH20] 5 cmH20  INTAKE / OUTPUT: Intake/Output     06/07 0701 - 06/08 0700 06/08 0701 - 06/09 0700   P.O. 300    I.V. (mL/kg) 1024 (17.6) 10 (0.2)   NG/GT 150    IV Piggyback 300    Total Intake(mL/kg) 1774 (30.5) 10 (0.2)   Urine (mL/kg/hr) 1915 (1.4)    Total Output 1915     Net -141 +10         PHYSICAL EXAMINATION: General: mild increase WOB HEENT: b/l sinus congestion Heart: regular, 2/6 SM Lungs: b/l wheeze Abdomen: soft, non tender Musculoskeletal: No clubbing or synovitis. No LE edema Skin: No rashes or lesions Neuro: Awake, following  commands and writing notes.  LABS:  CBC  Recent Labs Lab 11/24/13 0500 11/25/13 0338 11/25/13 0454  WBC 10.3 QUESTIONABLE RESULTS, RECOMMEND RECOLLECT TO VERIFY 14.2*  HGB 12.4 QUESTIONABLE RESULTS, RECOMMEND RECOLLECT TO VERIFY 12.6  HCT 36.1 QUESTIONABLE RESULTS, RECOMMEND RECOLLECT TO VERIFY 38.8  PLT 272 QUESTIONABLE RESULTS, RECOMMEND RECOLLECT TO VERIFY 258   BMET  Recent Labs Lab 11/23/13 0115 11/24/13 0500 11/25/13 0454  NA 130* 129* 133*  K 4.2 3.6* 4.4  CL 90* 90* 95*  CO2 32 26 31  BUN 5* 11 7  CREATININE 0.41* 0.36* 0.39*  GLUCOSE 130* 166* 98   Electrolytes  Recent Labs Lab 11/23/13 0115 11/24/13 0500 11/25/13 0454  CALCIUM 8.0* 8.4 8.6  MG  --  1.7 1.8  PHOS  --  2.5 2.5   Sepsis Markers  Recent Labs Lab 11/22/13 1156 11/23/13 0046 11/23/13 0620 11/25/13 0338  LATICACIDVEN 1.51  --  0.9  --   PROCALCITON  --  <0.10  --  <0.10   ABG  Recent Labs Lab 11/24/13 0402 11/24/13 0550 11/25/13 0609  PHART 7.527* 7.378 7.270*  PCO2ART 32.3* 47.0* 68.7*  PO2ART 92.6 121.0* 78.8*   Liver Enzymes  Recent Labs Lab 11/22/13 1145 11/23/13 0115  AST 28 45*  ALT 19 28  ALKPHOS 85 74  BILITOT 0.4 0.2*  ALBUMIN 3.8 2.9*  Cardiac Enzymes  Recent Labs Lab 11/22/13 1145 11/23/13 0620  TROPONINI  --  <0.30  PROBNP 4145.0*  --    Glucose  Recent Labs Lab 11/24/13 1220 11/24/13 1546 11/24/13 1957 11/24/13 2335 11/25/13 0344 11/25/13 0735  GLUCAP 123* 105* 83 96 94 106*   Imaging Dg Chest Port 1 View  11/24/2013   CLINICAL DATA:  Intubated.  EXAM: PORTABLE CHEST - 1 VIEW  COMPARISON:  11/23/2013.  FINDINGS: The cardiac silhouette, mediastinal and hilar contours are within normal limits and stable. There is moderate tortuosity and calcification of the thoracic aorta. The endotracheal tube, NG tube and left IJ catheters are stable. The heart and lungs are unchanged. There is persistent interstitial edema and areas of atelectasis. No  definite pleural effusions. Prominent skin fold noted over the right chest.  IMPRESSION: 1. Stable support apparatus. 2. Persistent interstitial edema and areas of atelectasis.   Electronically Signed   By: Kalman Jewels M.D.   On: 11/24/2013 06:11   Dg Abd Portable 1v  11/23/2013   CLINICAL DATA:  Insertion of orogastric tube.  EXAM: PORTABLE ABDOMEN - 1 VIEW  COMPARISON:  CT 07/06/2011  FINDINGS: Enteric tube is seen with tip and side-port overlying the stomach in the left upper quadrant. Bowel gas pattern is nonobstructive. There is mild curvature of the thoracolumbar spine convex to the right. There is moderate to severe degenerative change of the spine. There are mild degenerative changes of the hips and sacroiliac joints.  IMPRESSION: Nonobstructive bowel gas pattern.  Orogastric tube with tip and sideport over the stomach in the left upper quadrant.   Electronically Signed   By: Marin Olp M.D.   On: 11/23/2013 16:15   ASSESSMENT / PLAN:  PULMONARY A: Acute hypoxic/hypercapnic respiratory failure 2nd to AECOPD and possible PNA. Respiratory acidosis. P:   Unable to tolerate BiPAP Limit ABG's Brovana, pulmicort, spiriva PRN albuterol Continue solumedrol F/u CXR intermittently Bronchial hygiene Oxygen to keep SpO2 > 92%  CARDIOVASCULAR A:  Hypotension from septic shock >> resolved. Chronic systolic/diastolic heart failure. Aortic stenosis. P:  Goal negative fluid balance as tolerated Will need cardiology f/u with more stable  RENAL A:   No acute issues. P:   Monitor renal fx, urine outpt. electrolytes  GASTROINTESTINAL A:   Nutrition. P:   Heart healthy diet  HEMATOLOGIC A:  No issues. P:  Lovenox for DVT prevention F/u CBC intermittently  INFECTIOUS A:   ?PNA vs AECOPD. P:   Change Abx back to levaquin 6/08  ENDOCRINE A:   Steroid induced hyperglycemia. P:   Monitor blood sugar on BMET  NEUROLOGIC A:   Acute encephalopathy 2nd to respiratory  failure >> improved. P:   Monitor clinically  TODAY'S SUMMARY:  Unable to tolerate nimvs. May need reintubation. Check c x r , abg, start steroids.  Richardson Landry Minor ACNP Maryanna Shape PCCM Pager [phone] till 3 pm If no answer page [phone] 11/25/2013, 10:21 AM  Updated family at bedside.  CC time 35 minutes.   Chesley Mires, MD [facility] Pulmonary/Critical Care 11/25/2013, 11:53 AM Pager:  [phone] After 3pm call: [phone]

## 2013-11-25 NOTE — Procedures (Signed)
Intubation Procedure Note Jill White [id number] 12-20-1940  Procedure: Intubation Indications: Respiratory insufficiency  Procedure Details Consent: Risks of procedure as well as the alternatives and risks of each were explained to the (patient/caregiver).  Consent for procedure obtained. Time Out: Verified patient identification, verified procedure, site/side was marked, verified correct patient position, special equipment/implants available, medications/allergies/relevent history reviewed, required imaging and test results available.  Performed  MAC and 3 Medications:  Fentanyl  100 mcg Etomidate 30 mg Versed 2 mg NMB    Evaluation Hemodynamic Status: BP stable throughout; O2 sats: stable throughout Patient's Current Condition: stable Complications: No apparent complications Patient did tolerate procedure well. Chest X-ray ordered to verify placement.  CXR: pending.   Richardson Landry Minor ACNP Maryanna Shape PCCM Pager [phone] till 3 pm If no answer page [phone] 11/25/2013, 1:47 PM  I was present for and supervised the entire procedure  Merton Border, MD ; [facility] service Mobile [phone].  After 5:30 PM or weekends, call [phone]

## 2013-11-25 NOTE — Progress Notes (Signed)
PT Cancellation Note / Discharge  Patient Details Name: Jill White MRN: [id number] DOB: September 26, 1940   Cancelled Treatment:    Reason Eval/Treat Not Completed: Patient not medically ready Pt currently being intubated.  Please re-order when pt is medically ready. Thanks.   Junius Argyle 11/25/2013, 2:03 PM Carmelia Bake, PT, DPT 11/25/2013 Pager: [phone]

## 2013-11-26 ENCOUNTER — Inpatient Hospital Stay (HOSPITAL_COMMUNITY): Payer: Medicare Other

## 2013-11-26 DIAGNOSIS — F17200 Nicotine dependence, unspecified, uncomplicated: Secondary | ICD-10-CM

## 2013-11-26 DIAGNOSIS — I5043 Acute on chronic combined systolic (congestive) and diastolic (congestive) heart failure: Secondary | ICD-10-CM

## 2013-11-26 DIAGNOSIS — J122 Parainfluenza virus pneumonia: Secondary | ICD-10-CM

## 2013-11-26 DIAGNOSIS — J441 Chronic obstructive pulmonary disease with (acute) exacerbation: Secondary | ICD-10-CM

## 2013-11-26 DIAGNOSIS — I10 Essential (primary) hypertension: Secondary | ICD-10-CM

## 2013-11-26 LAB — BASIC METABOLIC PANEL
BUN: 10 mg/dL (ref 6–23)
BUN: 15 mg/dL (ref 6–23)
CALCIUM: 8.3 mg/dL — AB (ref 8.4–10.5)
CALCIUM: 8.5 mg/dL (ref 8.4–10.5)
CHLORIDE: 93 meq/L — AB (ref 96–112)
CO2: 30 mEq/L (ref 19–32)
CO2: 35 mEq/L — ABNORMAL HIGH (ref 19–32)
CREATININE: 0.33 mg/dL — AB (ref 0.50–1.10)
Chloride: 90 mEq/L — ABNORMAL LOW (ref 96–112)
Creatinine, Ser: 0.43 mg/dL — ABNORMAL LOW (ref 0.50–1.10)
GFR calc non Af Amer: >90 mL/min (ref >90)
Glucose, Bld: 132 mg/dL — ABNORMAL HIGH (ref 70–99)
Glucose, Bld: 113 mg/dL — ABNORMAL HIGH (ref 70–99)
POTASSIUM: 3 meq/L — AB (ref 3.7–5.3)
Potassium: 4.4 mEq/L (ref 3.7–5.3)
Sodium: 131 mEq/L — ABNORMAL LOW (ref 137–147)
Sodium: 135 mEq/L — ABNORMAL LOW (ref 137–147)

## 2013-11-26 LAB — GLUCOSE, CAPILLARY
GLUCOSE-CAPILLARY: 130 mg/dL — AB (ref 70–99)
GLUCOSE-CAPILLARY: 116 mg/dL — AB (ref 70–99)
Glucose-Capillary: 143 mg/dL — ABNORMAL HIGH (ref 70–99)
Glucose-Capillary: 105 mg/dL — ABNORMAL HIGH (ref 70–99)

## 2013-11-26 LAB — MAGNESIUM
MAGNESIUM: 1.6 mg/dL (ref 1.5–2.5)
Magnesium: 1.7 mg/dL (ref 1.5–2.5)

## 2013-11-26 LAB — PHOSPHORUS
Phosphorus: 2.7 mg/dL (ref 2.3–4.6)
Phosphorus: 2.7 mg/dL (ref 2.3–4.6)

## 2013-11-26 MED ORDER — HYDRALAZINE HCL 20 MG/ML IJ SOLN
10.0000 mg | INTRAMUSCULAR | Status: DC | PRN
  Administered 2013-11-26 – 2013-11-29 (×3): 10 mg via INTRAVENOUS
  Filled 2013-11-26 (×3): qty 1
  Filled 2013-11-26: qty 0.5

## 2013-11-26 MED ORDER — FUROSEMIDE 10 MG/ML IJ SOLN
40.0000 mg | Freq: Four times a day (QID) | INTRAMUSCULAR | Status: AC
  Administered 2013-11-26 (×2): 40 mg via INTRAVENOUS
  Filled 2013-11-26 (×2): qty 4

## 2013-11-26 MED ORDER — MAGNESIUM SULFATE 4000MG/100ML IJ SOLN
4.0000 g | Freq: Once | INTRAMUSCULAR | Status: AC
  Administered 2013-11-26: 4 g via INTRAVENOUS
  Filled 2013-11-26: qty 100

## 2013-11-26 MED ORDER — VITAL HIGH PROTEIN PO LIQD
1000.0000 mL | ORAL | Status: DC
  Administered 2013-11-26: 1000 mL

## 2013-11-26 MED ORDER — VITAL HIGH PROTEIN PO LIQD
1000.0000 mL | ORAL | Status: DC
  Administered 2013-11-26: 1000 mL
  Administered 2013-11-26: 20:00:00
  Administered 2013-11-26 – 2013-11-27 (×2): 1000 mL
  Filled 2013-11-26 (×3): qty 1000

## 2013-11-26 MED ORDER — POTASSIUM CHLORIDE 20 MEQ/15ML (10%) PO LIQD
40.0000 meq | ORAL | Status: AC
  Administered 2013-11-26 – 2013-11-27 (×2): 40 meq via ORAL
  Filled 2013-11-26 (×2): qty 30

## 2013-11-26 MED ORDER — POTASSIUM CHLORIDE CRYS ER 20 MEQ PO TBCR: 40.0000 meq | EXTENDED_RELEASE_TABLET | ORAL | Status: DC

## 2013-11-26 MED ORDER — METHYLPREDNISOLONE SODIUM SUCC 40 MG IJ SOLR
20.0000 mg | Freq: Two times a day (BID) | INTRAMUSCULAR | Status: DC
  Administered 2013-11-26 – 2013-11-30 (×8): 20 mg via INTRAVENOUS
  Filled 2013-11-26 (×9): qty 1

## 2013-11-26 MED ORDER — ASPIRIN 81 MG PO CHEW
81.0000 mg | CHEWABLE_TABLET | Freq: Every day | ORAL | Status: DC
  Administered 2013-11-26 – 2013-12-03 (×7): 81 mg
  Filled 2013-11-26 (×8): qty 1

## 2013-11-26 MED ORDER — IPRATROPIUM BROMIDE 0.02 % IN SOLN
0.5000 mg | Freq: Two times a day (BID) | RESPIRATORY_TRACT | Status: DC
  Administered 2013-11-26 – 2013-12-03 (×14): 0.5 mg via RESPIRATORY_TRACT
  Filled 2013-11-26 (×13): qty 2.5

## 2013-11-26 MED ORDER — INSULIN ASPART 100 UNIT/ML ~~LOC~~ SOLN
0.0000 [IU] | SUBCUTANEOUS | Status: DC
  Administered 2013-11-26 – 2013-11-28 (×8): 3 [IU] via SUBCUTANEOUS
  Administered 2013-11-29 – 2013-11-30 (×2): 4 [IU] via SUBCUTANEOUS
  Administered 2013-11-30 (×4): 3 [IU] via SUBCUTANEOUS

## 2013-11-26 MED ORDER — VITAL HIGH PROTEIN PO LIQD
1000.0000 mL | ORAL | Status: DC
  Filled 2013-11-26: qty 1000

## 2013-11-26 MED ORDER — LOSARTAN POTASSIUM 50 MG PO TABS
50.0000 mg | ORAL_TABLET | Freq: Every day | ORAL | Status: DC
  Administered 2013-11-26 – 2013-11-27 (×2): 50 mg
  Filled 2013-11-26 (×3): qty 1

## 2013-11-26 MED ORDER — VITAL HIGH PROTEIN PO LIQD: 1000.0000 mL | ORAL | Status: DC

## 2013-11-26 MED ORDER — MAGNESIUM SULFATE 50 % IJ SOLN: 4.0000 g | Freq: Once | INTRAVENOUS | Status: DC

## 2013-11-26 NOTE — Progress Notes (Signed)
[facility] Progress Note Patient Name: Jill White DOB: 11-22-1940 MRN: [id number]  Date of Service  11/26/2013   HPI/Events of Note   pvcm   Had low mag  eICU Interventions  supp mag, re assess lytes   Intervention Category Major Interventions: Arrhythmia - evaluation and management  [name] 11/26/2013, 8:01 PM

## 2013-11-26 NOTE — Progress Notes (Signed)
NUTRITION FOLLOW UP/CONSULT FOR TF MANAGEMENT  Intervention:   - Initiate TF via OGT of Vital High Protein start at 63m/hr increase by 145mevery 4 hours to goal of 3514mr. Goal rate will provide 840 calories, 74g protein, 702m68mee water. TF plus calories from current Propofol rate will provide 1299 calories and meet 100% estimated calorie needs and 111% estimated protein needs. If IVF d/c, recommend 150ml34mer flushes 4 times/day - Continue adult enteral protocol - RD to continue to monitor   Nutrition Dx:   Predicted suboptimal energy intake related to respiratory status as evidenced by recently intubated - no longer appropriate, re-intubated  New nutrition dx: Inadequate oral intake related to inability to eat as evidenced by NPO.   Goal:   Patient will meet >/=90% of estimated nutrition needs - not met but will be met with TF at goal rate in addition to calories from Propofol   Monitor:   Weights, labs, TF tolerance, vent status   Assessment:   73 ye4 old female with history of COPD, hypertension, presented to the ER with shortness of breath and wheezing.   6/7: - Patient was intubated 6/6, and consult received for enteral nutrition. Saturday RD started Vital High Protein at goal rate of 40 ml/hr per adult enteral nutrition protocol.  - Patient extubated this AM. TF discontinued. Diet advanced to Heart Healthy, to be started at 2pm  - Patient reports that her appetite was good prior to admission. She denies weight loss. No signs of muscle or subcutaneous fat wasting.   6/9: - Re-intubated 6/8 for respiratory insufficiency  - Received consult for TF management  Patient is currently intubated on ventilator support MV: 8.5 L/min Temp (24hrs), Avg:98.2 F (36.8 C), Min:96.8 F (36 C), Max:99.9 F (37.7 C)  Propofol: 17.4 ml/hr - provides 459 calories  Potassium, magnesium, and phosphorus WNL   Height: Ht Readings from Last 1 Encounters:  11/25/13 _0  (1.549 m)     Weight Status:   Wt Readings from Last 1 Encounters:  11/26/13 136 lb 14.5 oz (62.1 kg)  Admit wt         121 lb 7.6 oz (55.1 kg)  Re-estimated needs:  Kcal: 1296  Protein: 66-83g Fluid: per MD  Skin: Intact   Diet Order: NPO   Intake/Output Summary (Last 24 hours) at 11/26/13 0933 Last data filed at 11/26/13 0600  Gross per 24 hour  Intake 633.15 ml  Output   1180 ml  Net -546.85 ml    Last BM: PTA   Labs:   Recent Labs Lab 11/24/13 0500 11/25/13 0454 11/26/13 0500  NA 129* 133* 131*  K 3.6* 4.4 4.4  CL 90* 95* 93*  CO2 _1 BUN _2 CREATININE 0.36* 0.39* 0.33*  CALCIUM 8.4 8.6 8.3*  MG 1.7 1.8 1.7  PHOS 2.5 2.5 2.7  GLUCOSE 166* 98 132*    CBG (last 3)   Recent Labs  11/25/13 0344 11/25/13 0735 11/25/13 1201  GLUCAP 94 106* 143*    Scheduled Meds: . antiseptic oral rinse  15 mL Mouth Rinse QID  . arformoterol  15 mcg Nebulization Q12H  . aspirin  81 mg Per Tube Daily  . budesonide (PULMICORT) nebulizer solution  0.5 mg Nebulization Q12H  . chlorhexidine  15 mL Mouth Rinse BID  . clonazePAM  1 mg Oral QHS  . enoxaparin (LOVENOX) injection  40 mg Subcutaneous Q24H  . feeding supplement (VITAL HIGH PROTEIN)  1,000 mL  Per Tube Q24H  . furosemide  40 mg Intravenous Q6H  . insulin aspart  0-20 Units Subcutaneous 6 times per day  . ipratropium  0.5 mg Nebulization Q12H  . levofloxacin (LEVAQUIN) IV  750 mg Intravenous Q24H  . losartan  50 mg Per Tube Daily  . methylPREDNISolone (SOLU-MEDROL) injection  20 mg Intravenous Q12H  . pantoprazole sodium  40 mg Per Tube Q1200    Continuous Infusions: . sodium chloride 10 mL/hr at 11/23/13 1323  . propofol 50 mcg/kg/min (11/26/13 0646)    Carlis Stable MS, RD, LDN [phone] Pager [phone] Weekend/After Hours Pager

## 2013-11-26 NOTE — Progress Notes (Signed)
PULMONARY / CRITICAL CARE MEDICINE   Name: Jill White MRN: [id number] DOB: [birth year]    ADMISSION DATE:  11/22/2013 CONSULTATION DATE:  11/23/13  REFERRING MD :  Dr. Arnoldo Morale  CHIEF COMPLAINT:  Short of breath  BRIEF PATIENT DESCRIPTION:  73 yo female smoker admitted with AECOPD.  Intubated 6/06 and PCCM assumed care in ICU.  SIGNIFICANT EVENTS: 6/05 Admit 6/06 VDRF 6/08 Re-intubated  STUDIES:  Echo 6/06 >> EF 45 to 50%, severe LVH, mod AS, mod/severe LA dilation, PAAS 48 mmHg  LINES / TUBES: Left IJ CVC6/6>>> ETT 6/6>>>6/7 L radial a-line 6/6>>>out ETT 6/8  CULTURES: Blood cultures 6/6>>> Tracheal aspirate 6/6>>>oral flora Urine culture 6/6>>>negative Respiratory viral panel >> Parainfluenza 3  ANTIBIOTICS: Cefepime 6/6>>>6/8 Levaquin 6/6>>>off Vancomycin 6/6>>>6/8 Levaquin 6/08 >>>  SUBJECTIVE:  Re-intubated yesterday.  VITAL SIGNS: Temp:  [96.8 F (36 C)-99.9 F (37.7 C)] 96.8 F (36 C) (06/09 0600) Pulse Rate:  [50-133] 51 (06/09 0600) Resp:  [12-30] 16 (06/09 0600) BP: (102-184)/(26-141) 184/57 mmHg (06/09 0600) SpO2:  [91 %-100 %] 96 % (06/09 0600) FiO2 (%):  [40 %-100 %] 40 % (06/09 0347) Weight:  [136 lb 14.5 oz (62.1 kg)] 136 lb 14.5 oz (62.1 kg) (06/09 0500)  HEMODYNAMICS: CVP:  [9 mmHg-17 mmHg] 9 mmHg  VENTILATOR SETTINGS: Vent Mode:  [-] PRVC FiO2 (%):  [40 %-100 %] 40 % Set Rate:  [16 bmp] 16 bmp Vt Set:  [500 mL] 500 mL PEEP:  [5 cmH20] 5 cmH20 Plateau Pressure:  [18 cmH20-21 cmH20] 21 cmH20  INTAKE / OUTPUT: Intake/Output     06/08 0701 - 06/09 0700 06/09 0701 - 06/10 0700   P.O.     I.V. (mL/kg) 503.2 (8.1)    NG/GT     IV Piggyback 150    Total Intake(mL/kg) 653.2 (10.5)    Urine (mL/kg/hr) 1220 (0.8)    Emesis/NG output 60 (0)    Total Output 1280     Net -626.9           PHYSICAL EXAMINATION: General: ill appearing HEENT: ETT, OG in place Heart: regular, 2/6 SM Lungs: scattered rhonchi/rales Abdomen: soft, non  tender Musculoskeletal: no edema Skin: No rashes or lesions Neuro: RASS -1, moves extremities, follows commands  LABS:  CBC  Recent Labs Lab 11/24/13 0500 11/25/13 0338 11/25/13 0454  WBC 10.3 QUESTIONABLE RESULTS, RECOMMEND RECOLLECT TO VERIFY 14.2*  HGB 12.4 QUESTIONABLE RESULTS, RECOMMEND RECOLLECT TO VERIFY 12.6  HCT 36.1 QUESTIONABLE RESULTS, RECOMMEND RECOLLECT TO VERIFY 38.8  PLT 272 QUESTIONABLE RESULTS, RECOMMEND RECOLLECT TO VERIFY 258   BMET  Recent Labs Lab 11/24/13 0500 11/25/13 0454 11/26/13 0500  NA 129* 133* 131*  K 3.6* 4.4 4.4  CL 90* 95* 93*  CO2 26 31 30   BUN 11 7 10   CREATININE 0.36* 0.39* 0.33*  GLUCOSE 166* 98 132*   Electrolytes  Recent Labs Lab 11/24/13 0500 11/25/13 0454 11/26/13 0500  CALCIUM 8.4 8.6 8.3*  MG 1.7 1.8 1.7  PHOS 2.5 2.5 2.7   Sepsis Markers  Recent Labs Lab 11/22/13 1156 11/23/13 0046 11/23/13 0620 11/25/13 0338  LATICACIDVEN 1.51  --  0.9  --   PROCALCITON  --  <0.10  --  <0.10   ABG  Recent Labs Lab 11/25/13 0609 11/25/13 1244 11/25/13 1524  PHART 7.270* 7.209* 7.392  PCO2ART 68.7* 47.9* 51.2*  PO2ART 78.8* 128.0* 352.0*   Liver Enzymes  Recent Labs Lab 11/22/13 1145 11/23/13 0115  AST 28 45*  ALT 19 28  ALKPHOS 85 74  BILITOT 0.4 0.2*  ALBUMIN 3.8 2.9*   Cardiac Enzymes  Recent Labs Lab 11/22/13 1145 11/23/13 0620  TROPONINI  --  <0.30  PROBNP 4145.0*  --    Glucose  Recent Labs Lab 11/24/13 1546 11/24/13 1957 11/24/13 2335 11/25/13 0344 11/25/13 0735 11/25/13 1201  GLUCAP 105* 83 96 94 106* 143*   Imaging Dg Chest 1 View  11/25/2013   CLINICAL DATA:  Post intubation.  EXAM: CHEST - 1 VIEW  COMPARISON:  11/25/2013 at 1037 hr.  CT chest 11/22/2013.  FINDINGS: Endotracheal tube terminates approximately 4.7 cm above the carina. Left IJ central line tip projects over the SVC. Nasogastric tube is followed into the stomach. Heart size stable. Thoracic aorta is calcified. Mixed  interstitial and airspace disease, basilar dependent, with bilateral pleural effusions.  IMPRESSION: Congestive heart failure.   Electronically Signed   By: Lorin Picket M.D.   On: 11/25/2013 14:49   Dg Chest Port 1 View  11/26/2013   CLINICAL DATA:  Hypoxia  EXAM: PORTABLE CHEST - 1 VIEW  COMPARISON:  November 25, 2013  FINDINGS: Endotracheal tube tip is 3.2 cm above the carinal. Nasogastric tube tip and side port below the diaphragm. Central catheter tip is in the superior vena cava. There is no appreciable pneumothorax.  There remains moderate generalized interstitial edema with small effusions. There is no frank airspace consolidation. Heart is enlarged with pulmonary venous hypertension. There is atherosclerotic change in aorta. No adenopathy.  IMPRESSION: Persistent congestive heart failure. Tube and catheter positions as described without pneumothorax.   Electronically Signed   By: Lowella Grip M.D.   On: 11/26/2013 07:05   Dg Chest Port 1 View  11/25/2013   CLINICAL DATA:  Respiratory distress  EXAM: PORTABLE CHEST - 1 VIEW  COMPARISON:  11/24/2013  FINDINGS: The endotracheal tube and nasogastric catheter have been removed in the interval. A left-sided jugular central line is again seen in the mid superior vena cava. The cardiac shadow is stable. Diffuse interstitial changes are noted bilaterally. No focal confluent infiltrate is seen.  IMPRESSION: Chronic changes without acute abnormality.   Electronically Signed   By: Inez Catalina M.D.   On: 11/25/2013 11:16   ASSESSMENT / PLAN:  PULMONARY A: Acute hypoxic/hypercapnic respiratory failure 2nd to AECOPD, Parainfluenza PNA, and pulmonary edema. P:   Full vent support for now Scheduled brovana/ipratropium/pulmicort Prn albuterol Change solumedrol to 20 mg bid F/u CXR intermittently Bronchial hygiene Oxygen to keep SpO2 > 92%  CARDIOVASCULAR A:  Hypotension from septic shock >> resolved. Chronic systolic/diastolic heart  failure. Aortic stenosis. P:  Goal negative fluid balance as tolerated >> 40 mg lasix q6h x 2 on 6/09 Resume losartan, ASA Will need cardiology consult  RENAL A:   No acute issues. P:   Monitor renal fx, urine outpt. electrolytes  GASTROINTESTINAL A:   Nutrition. P:   Tube feeds while on vent Protonix for SUP  HEMATOLOGIC A:  No issues. P:  Lovenox for DVT prevention F/u CBC intermittently  INFECTIOUS A:   Parainfluenza PNA with ?bacterial superinfection. P:   Day 5/7 Abx, currently on levaquin  ENDOCRINE A:   Steroid induced hyperglycemia. P:   SSI while on steroids and tube feeds  NEUROLOGIC A:   Acute encephalopathy 2nd to respiratory failure >> improved. P:   RASS goal -1 Continue klonopin qhs  Updated family at bedside.  CC time 35 minutes.   Chesley Mires, MD [facility] Pulmonary/Critical Care 11/26/2013, 8:01 AM Pager:  [phone] After 3pm call: [phone]

## 2013-11-26 NOTE — Progress Notes (Signed)
[facility] Progress Note Patient Name: Jill White DOB: [birth year] MRN: [id number]  Date of Service  11/26/2013   HPI/Events of Note   k llw  eICU Interventions  suppk    Intervention Category Intermediate Interventions: Electrolyte abnormality - evaluation and management  [name] 11/26/2013, 10:32 PM

## 2013-11-27 ENCOUNTER — Inpatient Hospital Stay (HOSPITAL_COMMUNITY): Payer: Medicare Other

## 2013-11-27 ENCOUNTER — Encounter (HOSPITAL_COMMUNITY): Payer: Self-pay | Admitting: Physician Assistant

## 2013-11-27 DIAGNOSIS — I5043 Acute on chronic combined systolic (congestive) and diastolic (congestive) heart failure: Secondary | ICD-10-CM | POA: Diagnosis present

## 2013-11-27 DIAGNOSIS — I350 Nonrheumatic aortic (valve) stenosis: Secondary | ICD-10-CM | POA: Diagnosis present

## 2013-11-27 DIAGNOSIS — I359 Nonrheumatic aortic valve disorder, unspecified: Secondary | ICD-10-CM

## 2013-11-27 HISTORY — DX: Acute on chronic combined systolic (congestive) and diastolic (congestive) heart failure: I50.43

## 2013-11-27 LAB — CBC
HEMATOCRIT: 36.4 % (ref 36.0–46.0)
HEMOGLOBIN: 11.9 g/dL — AB (ref 12.0–15.0)
MCH: 31.4 pg (ref 26.0–34.0)
MCHC: 32.7 g/dL (ref 30.0–36.0)
MCV: 96 fL (ref 78.0–100.0)
Platelets: 264 10*3/uL (ref 150–400)
RBC: 3.79 MIL/uL — AB (ref 3.87–5.11)
RDW: 14.7 % (ref 11.5–15.5)
WBC: 9 10*3/uL (ref 4.0–10.5)

## 2013-11-27 LAB — GLUCOSE, CAPILLARY
GLUCOSE-CAPILLARY: 127 mg/dL — AB (ref 70–99)
GLUCOSE-CAPILLARY: 114 mg/dL — AB (ref 70–99)
GLUCOSE-CAPILLARY: 142 mg/dL — AB (ref 70–99)
Glucose-Capillary: 108 mg/dL — ABNORMAL HIGH (ref 70–99)
Glucose-Capillary: 123 mg/dL — ABNORMAL HIGH (ref 70–99)
Glucose-Capillary: 122 mg/dL — ABNORMAL HIGH (ref 70–99)

## 2013-11-27 LAB — BASIC METABOLIC PANEL
BUN: 18 mg/dL (ref 6–23)
CALCIUM: 8.6 mg/dL (ref 8.4–10.5)
CHLORIDE: 93 meq/L — AB (ref 96–112)
CO2: 35 meq/L — AB (ref 19–32)
CREATININE: 0.44 mg/dL — AB (ref 0.50–1.10)
GFR calc Af Amer: >90 mL/min (ref >90)
GFR calc non Af Amer: >90 mL/min (ref >90)
GLUCOSE: 115 mg/dL — AB (ref 70–99)
Potassium: 4.6 mEq/L (ref 3.7–5.3)
Sodium: 135 mEq/L — ABNORMAL LOW (ref 137–147)

## 2013-11-27 LAB — MAGNESIUM: Magnesium: 2.3 mg/dL (ref 1.5–2.5)

## 2013-11-27 LAB — PHOSPHORUS: Phosphorus: 2.3 mg/dL (ref 2.3–4.6)

## 2013-11-27 MED ORDER — FUROSEMIDE 10 MG/ML IJ SOLN
40.0000 mg | Freq: Four times a day (QID) | INTRAMUSCULAR | Status: AC
  Administered 2013-11-27 (×2): 40 mg via INTRAVENOUS
  Filled 2013-11-27 (×2): qty 4

## 2013-11-27 NOTE — Consult Note (Signed)
Cardiology Consultation Note  Patient ID: Jill White, MRN: [id number], DOB/AGE: December 27, 1940 73 y.o. Admit date: 11/22/2013   Date of Consult: 11/27/2013 Primary Physician: Laurey Morale, MD Primary Cardiologist: seen in consult by Dr. Harrington Challenger in 04/2013  Chief Complaint: SOB Reason for Consult: SOB, CHF (EF now 71-06% with AS of uncertain severity)  HPI: Jill White is a 73 y/o F with history of COPD (prior admissions for acute respiratory failure in setting of exacerbations), restless leg syndrome, gastric ulcer, and LBBB who presented to [facility] with progressive dyspnea and productive cough. She was seen by cardiology in 04/2013 for a newly recognized LBBB (although previously seen in 2012). 2D echo had shown normal LV function, mild MR, elevated PA pressure 68mmHg. The plan was for outpatient Myoview for risk stratification but it does not appear this was ever done.   With regard to this admission, she presented to her PCP's office 11/22/2013 with chest congestion and productive cough for 4-5 weeks. 2-3 days prior to admission she also developed fever, chills, and SOB. At her PCP's office she was found to have O2 sat 73% thus was sent to the ER. She was admitted and treated for acute COPD exacerbation and CAP. She was initially stable but then developed severe hypercarbic and hypoxemic respiratory failure along with hypotension and was intubated. She was started on steroids, nebs, and antibiotics. Propofol was discontinued due to hypotension and severe bradycardia - she required pressors for a short period of time. She was extubated 6/7 but was unable to tolerate bipap thus was reintubated 6/8. Resp failure showed parainfluenza. 2D echo shows EF 45-50%, at least moderate AS (possibly severe by 2D valve appearance), mod-severely dilated LA, severely calcified mitral annulus, and PA pressure 34mmHg. She was started on IV Lasix this AM and BP is also tolerating Losartan. Troponin neg x1 on  adm.  Past Medical History  Diagnosis Date  . HIP PAIN   . HYPERTENSION   . Restless leg syndrome   . COPD (chronic obstructive pulmonary disease)     a. Multiple prior admissions for acute hypoxic respiratory failure in setting of COPD exacerbations +/- pneumonia.  . Peptic ulcer disease     a. 2013: gastric antrum.  Marland Kitchen Hyponatremia   . Tobacco abuse   . Hypomagnesemia   . LBBB (left bundle branch block)   . Pulmonary HTN     a. Elevated PA pressure by prior echoes.  . Mitral regurgitation     a. Mild MR by echo 2014, not mentioned on 11/2013 (severely calcified annulus).      Most Recent Cardiac Studies: 2D Echo 11/23/13 this admission - Left ventricle: The cavity size was normal. There was severe concentric hypertrophy. Systolic function was mildly reduced. The estimated ejection fraction was in the range of 45% to 50%.  - Aortic valve: Inadequate Doppler study. Suspect at least moderate aortic stenosis, possibly severe by 2D valve appearance. ConsiderTEE. - Mitral valve: Severely calcified annulus. - Left atrium: The atrium was moderately to severely dilated. - Atrial septum: The septum bowed from left to right, consistent with increased left atrial pressure. - Pulmonary arteries: PA peak pressure: 48 mm Hg (S). Impressions:  - Compared to the study from 2014, LVEF has diminished, there is evidence of hypervolemia and aortic stenosis of unclear severity.  2D Echo 04/2013 - Left ventricle: The cavity size was mildly dilated. Wall thickness was increased in a pattern of moderate LVH. Systolic function was normal. The estimated ejection fraction  was in the range of 60% to 65%. Wall motion was normal; there were no regional wall motion abnormalities. There was an increased relative contribution of atrial contraction to ventricular filling. - Mitral valve: Calcified annulus. Mild regurgitation. - Left atrium: The atrium was mildly dilated. - Pulmonary arteries: Systolic pressure was  moderately increased. PA peak pressure: 70mm Hg (S).    Surgical History:  Past Surgical History  Procedure Laterality Date  . Abdominal hysterectomy    . Dilation and curettage of uterus    . Tonsillectomy    . Esophagogastroduodenoscopy  07/09/2011    Procedure: ESOPHAGOGASTRODUODENOSCOPY (EGD);  Surgeon: Missy Sabins, MD;  Location: [facility];  Service: Endoscopy;  Laterality: N/A;  patient in room 1532  . Cataract extraction w/ intraocular lens  implant, bilateral Bilateral 03/2013    Patient claims it was within the month.      Home Meds: Prior to Admission medications   Medication Sig Start Date End Date Taking? Authorizing Provider  albuterol (PROVENTIL HFA;VENTOLIN HFA) 108 (90 BASE) MCG/ACT inhaler Inhale 2 puffs into the lungs every 4 (four) hours as needed for wheezing or shortness of breath. 06/24/13  Yes Laurey Morale, MD  aspirin 81 MG tablet Take 81 mg by mouth every morning.    Yes Historical Provider, MD  clonazePAM (KLONOPIN) 0.5 MG tablet Take 1 mg by mouth at bedtime.   Yes Historical Provider, MD  dextromethorphan-guaiFENesin (MUCINEX DM) 30-600 MG per 12 hr tablet Take 1 tablet by mouth 2 (two) times daily.   Yes Historical Provider, MD  furosemide (LASIX) 20 MG tablet Take 20 mg by mouth every morning.   Yes Historical Provider, MD  losartan-hydrochlorothiazide (HYZAAR) 50-12.5 MG per tablet Take 1 tablet by mouth every morning.   Yes Historical Provider, MD  ondansetron (ZOFRAN) 4 MG tablet Take 4 mg by mouth every 4 (four) hours as needed for nausea or vomiting.   Yes Historical Provider, MD  potassium chloride (K-DUR) 10 MEQ tablet Take 10 mEq by mouth 2 (two) times daily.   Yes Historical Provider, MD  vitamin E 100 UNIT capsule Take 100 Units by mouth every morning.    Yes Historical Provider, MD    Inpatient Medications:  . antiseptic oral rinse  15 mL Mouth Rinse QID  . arformoterol  15 mcg Nebulization Q12H  . aspirin  81 mg Per Tube Daily  .  budesonide (PULMICORT) nebulizer solution  0.5 mg Nebulization Q12H  . chlorhexidine  15 mL Mouth Rinse BID  . clonazePAM  1 mg Oral QHS  . enoxaparin (LOVENOX) injection  40 mg Subcutaneous Q24H  . feeding supplement (VITAL HIGH PROTEIN)  1,000 mL Per Tube Q24H  . furosemide  40 mg Intravenous Q6H  . insulin aspart  0-20 Units Subcutaneous 6 times per day  . ipratropium  0.5 mg Nebulization Q12H  . levofloxacin (LEVAQUIN) IV  750 mg Intravenous Q24H  . losartan  50 mg Per Tube Daily  . methylPREDNISolone (SOLU-MEDROL) injection  20 mg Intravenous Q12H  . pantoprazole sodium  40 mg Per Tube Q1200   . sodium chloride 10 mL/hr at 11/26/13 2200  . propofol 45 mcg/kg/min (11/27/13 1222)    Allergies:  Allergies  Allergen Reactions  . Tetanus Toxoid Anaphylaxis  . Codeine Nausea And Vomiting and Other (See Comments)    Itching and faint   . Erythromycin Nausea And Vomiting  . Meloxicam Itching  . Methylprednisolone Hives  . Penicillins Hives  . Prednisone Other (See Comments)  GI bleed    History   Social History  . Marital Status: Widowed    Spouse Name: N/A    Number of Children: N/A  . Years of Education: N/A   Occupational History  . Not on file.   Social History Main Topics  . Smoking status: Current Every Day Smoker    Types: Cigarettes  . Smokeless tobacco: Never Used     Comment: 1 pack each day  . Alcohol Use: 0.5 oz/week    1 drink(s) per week  . Drug Use: No  . Sexual Activity: No   Other Topics Concern  . Not on file   Social History Narrative  . No narrative on file     Family History  Problem Relation Age of Onset  . Heart disease Sister   . Heart disease Brother   . Dementia Mother   . Anemia Father      Review of Systems:unable to obtain as patient is intubated.  Labs:  Lab Results  Component Value Date   WBC 9.0 11/27/2013   HGB 11.9* 11/27/2013   HCT 36.4 11/27/2013   MCV 96.0 11/27/2013   PLT 264 11/27/2013    Recent Labs Lab  11/23/13 0115  11/27/13 0515  NA 130*  < > 135*  K 4.2  < > 4.6  CL 90*  < > 93*  CO2 32  < > 35*  BUN 5*  < > 18  CREATININE 0.41*  < > 0.44*  CALCIUM 8.0*  < > 8.6  PROT 6.3  --   --   BILITOT 0.2*  --   --   ALKPHOS 74  --   --   ALT 28  --   --   AST 45*  --   --   GLUCOSE 130*  < > 115*  < > = values in this interval not displayed. Lab Results  Component Value Date   CHOL 166 11/04/2010   HDL 71.70 11/04/2010   LDLCALC 73 11/04/2010   TRIG 98 11/25/2013   Lab Results  Component Value Date   DDIMER 0.51* 11/22/2013   Troponin neg x 1  Radiology/Studies:  Dg Chest 1 View6/01/2014   CLINICAL DATA:  Post intubation.  EXAM: CHEST - 1 VIEW  COMPARISON:  11/25/2013 at 1037 hr.  CT chest 11/22/2013.  FINDINGS: Endotracheal tube terminates approximately 4.7 cm above the carina. Left IJ central line tip projects over the SVC. Nasogastric tube is followed into the stomach. Heart size stable. Thoracic aorta is calcified. Mixed interstitial and airspace disease, basilar dependent, with bilateral pleural effusions.  IMPRESSION: Congestive heart failure.   Electronically Signed   By: Lorin Picket M.D.   On: 11/25/2013 14:49   Ct Angio Chest Pe W/cm &/or Wo Cm6/10/2013   CLINICAL DATA:  Cough, shortness of breath and fever and hypoxia ; history of COPD and tobacco use  EXAM: CT ANGIOGRAPHY CHEST WITH CONTRAST  TECHNIQUE: Multidetector CT imaging of the chest was performed using the standard protocol during bolus administration of intravenous contrast. Multiplanar CT image reconstructions and MIPs were obtained to evaluate the vascular anatomy.  CONTRAST:  111mL OMNIPAQUE IOHEXOL 350 MG/ML SOLN  COMPARISON:  PA and lateral chest x-ray of today's date  FINDINGS: There is respiratory motion artifact due to the patient's tachypnea. There are patchy interstitial infiltrates in both lower lobes and in the right middle lobe. There is mild bronchiectasis in the right lower lobe. There is a small right  pleural  effusion layering posteriorly. The cardiac chambers are mildly enlarged. The caliber of the thoracic aorta is normal. Contrast within the visualized portions of the pulmonary arterial tree is normal. There is an enlarged right hilar lymph node measuring 2.3 cm in diameter. A subcarinal lymph node measures 1.3 cmin short axis. A left hilar lymph node measures 0.8 cm in short axis.  Within the upper abdomen the observed portions of the liver and spleen and adrenal glands exhibit no acute abnormalities.  The thoracic vertebral bodies are preserved in height. Anterior endplate osteophytes are present at multiple levels. No acute rib abnormality is demonstrated.  Review of the MIP images confirms the above findings.  IMPRESSION: 1. Bibasilar pneumonia is present with small right pleural effusion. There is underlying COPD. 2. There are mildly enlarged mediastinal and right hilar lymph nodes. 3. There is mild cardiac enlargement without evidence of pulmonary edema. The observed portions of the pulmonary arterial tree reveal no emboli.   Electronically Signed   By: David  Martinique   On: 11/22/2013 21:56   Dg Chest Port 1 View6/03/2014   CLINICAL DATA:  Congestive heart failure.  EXAM: PORTABLE CHEST - 1 VIEW  COMPARISON:  11/26/2013.  FINDINGS: Endotracheal tube, NG tube, central line in stable position. Cardiomegaly and pulmonary venous congestion with interstitial edema again noted. Small bilateral pleural effusions noted. Pulmonary interstitial edema and pleural effusions have improved slightly from prior exam. Linear density noted in the left pulmonary apex most likely is related to overlying respiratory apparatus as opposed to a pneumothorax. Lung markings are noted distal to this linear density. This region however merits attention on follow-up chest x-ray. No acute osseous abnormality.  IMPRESSION: 1. Congestive heart failure and pulmonary interstitial edema with bilateral pleural effusions. Slight  improvement from prior exam. 2. Line tube positions stable. 3. Subtle linear density noted over the left pulmonary apex most likely represents overlying respiratory apparatus as opposed to a subtle pneumothorax. Lung markings are noted distal to this linear density. Nevertheless this region merits attention on follow-up chest x-ray.   Electronically Signed   By: Marcello Moores  Register   On: 11/27/2013 07:27    EKG: sinus tachycardia at 107 bpm with LBBB  Physical Exam: Blood pressure 124/34, pulse 67, temperature 98.8 F (37.1 C), temperature source Core (Comment), resp. rate 22, height 5\' 1"  (1.549 m), weight 129 lb 3 oz (58.6 kg), SpO2 96.00%. General: Well developed, ill appearing 73 year old female who is alert but remains intubated, currently in no acute distress. Head: Normocephalic, atraumatic, sclera non-icteric, no xanthomas, nares are without discharge. ET tube in place.  Neck: JVD not elevated. Lungs: Ventilated. Diminished breath sounds bilaterally. No wheezes, rales, or rhonchi.  Heart: RRR with S1 S2. II/VI systolic murmur. No rub or gallop appreciated. Abdomen: Soft, non-tender, non-distended with normoactive bowel sounds.  Extremities: No clubbing or cyanosis. No edema.  Distal pedal pulses are 2+ and equal bilaterally. Neuro: Alert. Answers questions with thumbs up or thumbs down.  Psych:  Deferred. Intubated.   Assessment and Plan:  Valvular heart disease with "at least" moderate aortic stenosis and heavily calcified aortic and mitral valves LV dysfunction, EF 45-50% Chronic LBBB Acute on chronic respiratory failure End-stage COPD Parainfluenza PNA  Jill White presents with acute on chronic respiratory failure and remains intubated on full ventilator support. Echo shows LVEF 45-50% and at least moderate aortic stenosis. Poor windows make determination of severity uncertain. We have discussed options for further evaluation with her family including TEE and  cardiac catheterization.      America Brown Yarel Rushlow PA-C 11/27/2013, 1:08 PM

## 2013-11-27 NOTE — Consult Note (Signed)
Pt. Seen and examined. Agree with the NP/PA-C note as written.  Very pleasant female with unfortunate advanced COPD and failure to wean off of the vent.  She had an echocardiogram which demonstrated newly reduced LVEF of 45-50% with global hypokinesis.  Echo windows were poor, however, there is aortic valve calcification and probably moderate stenosis - no gradient was obtained. By clinical exam, she has moderate aortic stenosis. There is heavy mitral annular calcification, but no mitral stenosis. She has a history of LBBB and was supposed to have a stress test last year, but did not. She is having intermittent atrial tachycardia, with some abberancy - however, the baseline LBBB is not currently present. This could be rate-related or suggest ischemia.   I had a long discussion with the family regarding options for further diagnosis of her heart disease. I do not feel that her moderate AS or moderate LV dysfunction are keeping her from being extubated.  One option could be TEE, while she is intubated, however, it is not clear what additional information this will provide. The valve looks moderately stenotic on echo and physical exam confirms this.  Based on her LV dysfunction and high pre-test probability of CAD, catheterization would be recommended. We could obtain a direct aortic valve gradient during cath.  This is what I have recommended to her.  She appears to be adequately diuresed. CVP is 6 today. She may be nearing another trial of extubation. If she can be successfully extubated - cardiac catheterization would be recommended.    Thanks for the consult. Dr. Burt Knack will see on rounds in the am.  Pixie Casino, MD, [facility] Attending Cardiologist [place]

## 2013-11-27 NOTE — Progress Notes (Signed)
PULMONARY / CRITICAL CARE MEDICINE   Name: Jill White MRN: [id number] DOB: 20-Aug-1940    ADMISSION DATE:  11/22/2013 CONSULTATION DATE:  11/23/13  REFERRING MD :  Dr. Arnoldo Morale  CHIEF COMPLAINT:  Short of breath  BRIEF PATIENT DESCRIPTION:  73 yo female smoker admitted with AECOPD.  Intubated 6/06 and PCCM assumed care in ICU.  SIGNIFICANT EVENTS: 6/05 Admit 6/06 VDRF 6/08 Re-intubated 6/10 Cardiology consulted  STUDIES:  Echo 6/06 >> EF 45 to 50%, severe LVH, mod AS, mod/severe LA dilation, PAS 48 mmHg  LINES / TUBES: Left IJ CVC6/6>>> ETT 6/6>>>6/7 L radial a-line 6/6>>>out ETT 6/8>>  CULTURES: Blood cultures 6/6>>> Tracheal aspirate 6/6>>>oral flora Urine culture 6/6>>>negative Respiratory viral panel >> Parainfluenza 3  ANTIBIOTICS: Cefepime 6/6>>>6/8 Levaquin 6/6>>>off Vancomycin 6/6>>>6/8 Levaquin 6/08 >>>  SUBJECTIVE:  On ps 10 cpap 5 with Vt 460. 40% fio2  VITAL SIGNS: Temp:  [97.3 F (36.3 C)-99 F (37.2 C)] 98.6 F (37 C) (06/10 0600) Pulse Rate:  [60-99] 74 (06/10 0341) Resp:  [14-23] 16 (06/10 0600) BP: (107-158)/(30-60) 111/38 mmHg (06/10 0600) SpO2:  [94 %-99 %] 94 % (06/10 0600) FiO2 (%):  [40 %] 40 % (06/10 0730) Weight:  [58.6 kg (129 lb 3 oz)] 58.6 kg (129 lb 3 oz) (06/10 0400)  VENTILATOR SETTINGS: Vent Mode:  [-] PRVC FiO2 (%):  [40 %] 40 % Set Rate:  [16 bmp] 16 bmp Vt Set:  [500 mL] 500 mL PEEP:  [5 cmH20] 5 cmH20 Plateau Pressure:  [16 cmH20-20 cmH20] 18 cmH20  INTAKE / OUTPUT: Intake/Output     06/09 0701 - 06/10 0700 06/10 0701 - 06/11 0700   I.V. (mL/kg) 773 (13.2)    NG/GT 692.2    IV Piggyback 150    Total Intake(mL/kg) 1615.1 (27.6)    Urine (mL/kg/hr) 4130 (2.9)    Emesis/NG output 100 (0.1)    Total Output 4230     Net -2614.9           PHYSICAL EXAMINATION: General: ill appearing, awake on vent HEENT: ETT, OG in place Heart: regular, 2/6 SM Lungs: scattered rhonchi/rales.mild exp wheeze Abdomen: soft,  non tender Musculoskeletal: no edema Skin: No rashes or lesions Neuro: RASS -1, moves extremities, follows commands on diprivan drip  LABS: CBC Recent Labs     11/25/13  0338  11/25/13  0454  11/27/13  0515  WBC  QUESTIONABLE RESULTS, RECOMMEND RECOLLECT TO VERIFY  14.2*  9.0  HGB  QUESTIONABLE RESULTS, RECOMMEND RECOLLECT TO VERIFY  12.6  11.9*  HCT  QUESTIONABLE RESULTS, RECOMMEND RECOLLECT TO VERIFY  38.8  36.4  PLT  QUESTIONABLE RESULTS, RECOMMEND RECOLLECT TO VERIFY  258  264   BMET Recent Labs     11/26/13  0500  11/26/13  2030  11/27/13  0515  NA  131*  135*  135*  K  4.4  3.0*  4.6  CL  93*  90*  93*  CO2  30  35*  35*  BUN  10  15  18   CREATININE  0.33*  0.43*  0.44*  GLUCOSE  132*  113*  115*    Electrolytes Recent Labs     11/26/13  0500  11/26/13  2030  11/27/13  0515  CALCIUM  8.3*  8.5  8.6  MG  1.7  1.6  2.3  PHOS  2.7  2.7  2.3    Sepsis Markers Recent Labs     11/25/13  0338  PROCALCITON  <0.10  ABG Recent Labs     11/25/13  0609  11/25/13  1244  11/25/13  1524  PHART  7.270*  7.209*  7.392  PCO2ART  68.7*  47.9*  51.2*  PO2ART  78.8*  128.0*  352.0*   Glucose Recent Labs     11/25/13  1201  11/26/13  0959  11/26/13  1251  11/26/13  1556  11/26/13  1908  11/27/13  0405  GLUCAP  143*  130*  116*  143*  105*  108*    Imaging Dg Chest 1 View  11/25/2013   CLINICAL DATA:  Post intubation.  EXAM: CHEST - 1 VIEW  COMPARISON:  11/25/2013 at 1037 hr.  CT chest 11/22/2013.  FINDINGS: Endotracheal tube terminates approximately 4.7 cm above the carina. Left IJ central line tip projects over the SVC. Nasogastric tube is followed into the stomach. Heart size stable. Thoracic aorta is calcified. Mixed interstitial and airspace disease, basilar dependent, with bilateral pleural effusions.  IMPRESSION: Congestive heart failure.   Electronically Signed   By: Lorin Picket M.D.   On: 11/25/2013 14:49   Dg Chest Port 1 View  11/27/2013    CLINICAL DATA:  Congestive heart failure.  EXAM: PORTABLE CHEST - 1 VIEW  COMPARISON:  11/26/2013.  FINDINGS: Endotracheal tube, NG tube, central line in stable position. Cardiomegaly and pulmonary venous congestion with interstitial edema again noted. Small bilateral pleural effusions noted. Pulmonary interstitial edema and pleural effusions have improved slightly from prior exam. Linear density noted in the left pulmonary apex most likely is related to overlying respiratory apparatus as opposed to a pneumothorax. Lung markings are noted distal to this linear density. This region however merits attention on follow-up chest x-ray. No acute osseous abnormality.  IMPRESSION: 1. Congestive heart failure and pulmonary interstitial edema with bilateral pleural effusions. Slight improvement from prior exam. 2. Line tube positions stable. 3. Subtle linear density noted over the left pulmonary apex most likely represents overlying respiratory apparatus as opposed to a subtle pneumothorax. Lung markings are noted distal to this linear density. Nevertheless this region merits attention on follow-up chest x-ray.   Electronically Signed   By: Marcello Moores  Register   On: 11/27/2013 07:27   Dg Chest Port 1 View  11/26/2013   CLINICAL DATA:  Hypoxia  EXAM: PORTABLE CHEST - 1 VIEW  COMPARISON:  November 25, 2013  FINDINGS: Endotracheal tube tip is 3.2 cm above the carinal. Nasogastric tube tip and side port below the diaphragm. Central catheter tip is in the superior vena cava. There is no appreciable pneumothorax.  There remains moderate generalized interstitial edema with small effusions. There is no frank airspace consolidation. Heart is enlarged with pulmonary venous hypertension. There is atherosclerotic change in aorta. No adenopathy.  IMPRESSION: Persistent congestive heart failure. Tube and catheter positions as described without pneumothorax.   Electronically Signed   By: Lowella Grip M.D.   On: 11/26/2013 07:05   Dg Chest  Port 1 View  11/25/2013   CLINICAL DATA:  Respiratory distress  EXAM: PORTABLE CHEST - 1 VIEW  COMPARISON:  11/24/2013  FINDINGS: The endotracheal tube and nasogastric catheter have been removed in the interval. A left-sided jugular central line is again seen in the mid superior vena cava. The cardiac shadow is stable. Diffuse interstitial changes are noted bilaterally. No focal confluent infiltrate is seen.  IMPRESSION: Chronic changes without acute abnormality.   Electronically Signed   By: Inez Catalina M.D.   On: 11/25/2013 11:16  ASSESSMENT / PLAN:  PULMONARY A: Acute hypoxic/hypercapnic respiratory failure 2nd to AECOPD, Parainfluenza PNA, and pulmonary edema. P:   Pressure support wean as tolerated >> not ready for extubation yet Scheduled brovana/ipratropium/pulmicort Prn albuterol Continue solumedrol to 20 mg bid F/u CXR intermittently Bronchial hygiene Oxygen to keep SpO2 > 92%  CARDIOVASCULAR A:  Hypotension from septic shock >> resolved. Chronic systolic/diastolic heart failure. Aortic stenosis. P:  Goal negative fluid balance as tolerated >> 40 mg lasix q6h x 2 on 6/10 Continue losartan, ASA Consult cardiology  RENAL A:   No acute issues. P:   Monitor renal fx, urine outpt. electrolytes  GASTROINTESTINAL A:   Nutrition. P:   Tube feeds while on vent Protonix for SUP  HEMATOLOGIC A:  No issues. P:  Lovenox for DVT prevention F/u CBC intermittently  INFECTIOUS A:   Parainfluenza PNA with ?bacterial superinfection. P:   Day 6/7 Abx, currently on levaquin  ENDOCRINE A:   Steroid induced hyperglycemia. P:   SSI while on steroids and tube feeds  NEUROLOGIC A:   Acute encephalopathy 2nd to respiratory failure >> improved. P:   RASS goal -1 Continue klonopin qhs  Updated granddaughter/ mother at bedside. They are talking as if trach option is less likely. Goal will be to optimize pulmonary status, extubate hopefully with her surviving but if  in distress, no reintubation and comfort care. The granddaughter is coming around to respecting th patient's wishes of no trach.  Richardson Landry Minor ACNP Maryanna Shape PCCM Pager [phone] till 3 pm If no answer page [phone] 11/27/2013, 9:06 AM      Reviewed above, examined, and documentation changes made as needed.  She is making some progress with vent weaning.  Will ask cardiology to assist with heart failure management >> she will likely need cath at some point also.  Ongoing discussions with family about goals of care.  CC time 35 minutes.  Chesley Mires, MD [facility] Pulmonary/Critical Care 11/27/2013, 9:24 AM Pager:  [phone] After 3pm call: [phone]

## 2013-11-28 ENCOUNTER — Inpatient Hospital Stay (HOSPITAL_COMMUNITY): Payer: Medicare Other

## 2013-11-28 DIAGNOSIS — I509 Heart failure, unspecified: Secondary | ICD-10-CM

## 2013-11-28 LAB — GLUCOSE, CAPILLARY
Glucose-Capillary: 107 mg/dL — ABNORMAL HIGH (ref 70–99)
Glucose-Capillary: 114 mg/dL — ABNORMAL HIGH (ref 70–99)
Glucose-Capillary: 115 mg/dL — ABNORMAL HIGH (ref 70–99)
Glucose-Capillary: 128 mg/dL — ABNORMAL HIGH (ref 70–99)
Glucose-Capillary: 132 mg/dL — ABNORMAL HIGH (ref 70–99)
Glucose-Capillary: 95 mg/dL (ref 70–99)

## 2013-11-28 LAB — CBC
HCT: 37.5 % (ref 36.0–46.0)
Hemoglobin: 12.2 g/dL (ref 12.0–15.0)
MCH: 31.7 pg (ref 26.0–34.0)
MCHC: 32.5 g/dL (ref 30.0–36.0)
MCV: 97.4 fL (ref 78.0–100.0)
PLATELETS: 302 10*3/uL (ref 150–400)
RBC: 3.85 MIL/uL — ABNORMAL LOW (ref 3.87–5.11)
RDW: 14.9 % (ref 11.5–15.5)
WBC: 8.9 10*3/uL (ref 4.0–10.5)

## 2013-11-28 LAB — BASIC METABOLIC PANEL
BUN: 26 mg/dL — ABNORMAL HIGH (ref 6–23)
CALCIUM: 9 mg/dL (ref 8.4–10.5)
CHLORIDE: 89 meq/L — AB (ref 96–112)
CO2: 40 mEq/L (ref 19–32)
Creatinine, Ser: 0.43 mg/dL — ABNORMAL LOW (ref 0.50–1.10)
GFR calc non Af Amer: >90 mL/min (ref >90)
Glucose, Bld: 101 mg/dL — ABNORMAL HIGH (ref 70–99)
Potassium: 3.6 mEq/L — ABNORMAL LOW (ref 3.7–5.3)
Sodium: 137 mEq/L (ref 137–147)

## 2013-11-28 LAB — BLOOD GAS, ARTERIAL
Acid-Base Excess: 14.1 mmol/L — ABNORMAL HIGH (ref 0.0–2.0)
Bicarbonate: 39.1 mEq/L — ABNORMAL HIGH (ref 20.0–24.0)
Drawn by: 232811
FIO2: 0.4 %
LHR: 14 {breaths}/min
MECHVT: 500 mL
O2 Saturation: 91 %
PATIENT TEMPERATURE: 37.7
PEEP: 5 cmH2O
PO2 ART: 62.7 mmHg — AB (ref 80.0–100.0)
TCO2: 34.5 mmol/L (ref 0–100)
pCO2 arterial: 50.2 mmHg — ABNORMAL HIGH (ref 35.0–45.0)
pH, Arterial: 7.507 — ABNORMAL HIGH (ref 7.350–7.450)

## 2013-11-28 LAB — TRIGLYCERIDES: Triglycerides: 129 mg/dL (ref <150)

## 2013-11-28 LAB — CULTURE, BLOOD (ROUTINE X 2)
CULTURE: NO GROWTH
Culture: NO GROWTH

## 2013-11-28 LAB — MAGNESIUM: Magnesium: 1.8 mg/dL (ref 1.5–2.5)

## 2013-11-28 LAB — PHOSPHORUS: PHOSPHORUS: 3.3 mg/dL (ref 2.3–4.6)

## 2013-11-28 MED ORDER — DILTIAZEM HCL 30 MG PO TABS
30.0000 mg | ORAL_TABLET | Freq: Four times a day (QID) | ORAL | Status: DC
  Filled 2013-11-28: qty 1

## 2013-11-28 MED ORDER — MAGNESIUM SULFATE 40 MG/ML IJ SOLN
2.0000 g | Freq: Once | INTRAMUSCULAR | Status: AC
  Administered 2013-11-28: 2 g via INTRAVENOUS
  Filled 2013-11-28: qty 50

## 2013-11-28 MED ORDER — LOSARTAN POTASSIUM 50 MG PO TABS
50.0000 mg | ORAL_TABLET | Freq: Every day | ORAL | Status: DC
  Administered 2013-11-29 – 2013-12-03 (×5): 50 mg via ORAL
  Filled 2013-11-28 (×6): qty 1

## 2013-11-28 MED ORDER — FUROSEMIDE 40 MG PO TABS: 40.0000 mg | ORAL_TABLET | Freq: Every day | ORAL | Status: DC

## 2013-11-28 MED ORDER — PANTOPRAZOLE SODIUM 40 MG IV SOLR
40.0000 mg | INTRAVENOUS | Status: DC
  Administered 2013-11-28 – 2013-12-02 (×5): 40 mg via INTRAVENOUS
  Filled 2013-11-28 (×6): qty 40

## 2013-11-28 MED ORDER — VITAMINS A & D EX OINT
TOPICAL_OINTMENT | CUTANEOUS | Status: AC
  Administered 2013-11-28: 1
  Filled 2013-11-28: qty 5

## 2013-11-28 MED ORDER — DILTIAZEM HCL 100 MG IV SOLR
5.0000 mg/h | INTRAVENOUS | Status: DC
  Administered 2013-11-28: 5 mg/h via INTRAVENOUS

## 2013-11-28 MED ORDER — POTASSIUM CHLORIDE 20 MEQ/15ML (10%) PO LIQD
40.0000 meq | Freq: Once | ORAL | Status: AC
  Administered 2013-11-28: 40 meq
  Filled 2013-11-28: qty 30

## 2013-11-28 MED ORDER — FUROSEMIDE 10 MG/ML IJ SOLN
40.0000 mg | Freq: Every day | INTRAMUSCULAR | Status: DC
  Administered 2013-11-28 – 2013-11-30 (×3): 40 mg via INTRAVENOUS
  Filled 2013-11-28 (×4): qty 4

## 2013-11-28 NOTE — Procedures (Signed)
Extubation Procedure Note  Patient Details:   Name: Jill White DOB: 1941-05-04 MRN: [id number]   Airway Documentation:  Airway 7.5 mm (Active)  Secured at (cm) 23 cm 11/28/2013  7:42 AM  Measured From Lips 11/28/2013  7:42 AM  Secured Location Right 11/28/2013  7:42 AM  Secured By [employer] 11/28/2013  7:42 AM  Tube Holder Repositioned Yes 11/28/2013  7:42 AM  Cuff Pressure (cm H2O) 24 cm H2O 11/28/2013  7:42 AM    Evaluation  O2 sats: stable throughout Complications: No apparent complications Patient did tolerate procedure well. Bilateral Breath Sounds: Clear;Diminished Suctioning: Airway Yes  Johnette Abraham 11/28/2013, 9:48 AM

## 2013-11-28 NOTE — Progress Notes (Signed)
On call cardiologist paged because Jill White was having frequent runs of tachycardia, but Jill White asymptomatic. Md Jules Husbands returned call and orders were received. Will continue to monitor.

## 2013-11-28 NOTE — Progress Notes (Signed)
Attempted to give pt po med with water however pt choked on the water. Says she just came off the ventilator and her throat is sore.  Will notify NP for further orders. Jacklynn Lewis RN

## 2013-11-28 NOTE — Progress Notes (Signed)
PULMONARY / CRITICAL CARE MEDICINE   Name: Jill White MRN: [id number] DOB: 1940/06/26    ADMISSION DATE:  11/22/2013 CONSULTATION DATE:  11/23/13  REFERRING MD :  Dr. Arnoldo Morale  CHIEF COMPLAINT:  Short of breath  BRIEF PATIENT DESCRIPTION:  73 yo female smoker admitted with AECOPD.  Intubated 6/06 and PCCM assumed care in ICU.  SIGNIFICANT EVENTS: 6/05 Admit 6/06 VDRF 6/08 Re-intubated 6/10 Cardiology consulted  STUDIES:  Echo 6/06 >> EF 45 to 50%, severe LVH, mod AS, mod/severe LA dilation, PAS 48 mmHg  LINES / TUBES: Left IJ CVC6/6>>> ETT 6/6>>>6/7 L radial a-line 6/6>>>out ETT 6/8>>6/11  CULTURES: Blood cultures 6/6>>> Tracheal aspirate 6/6>>>oral flora Urine culture 6/6>>>negative Respiratory viral panel >> Parainfluenza 3  ANTIBIOTICS: Cefepime 6/6>>>6/8 Levaquin 6/6>>>off Vancomycin 6/6>>>6/8 Levaquin 6/08 >>>6/11  SUBJECTIVE:  No acute change.  Tol PS 10/5.   VITAL SIGNS: Temp:  [97.7 F (36.5 C)-99.7 F (37.6 C)] 99.7 F (37.6 C) (06/11 0800) Pulse Rate:  [51-72] 72 (06/11 0800) Resp:  [14-22] 20 (06/11 0800) BP: (113-163)/(30-50) 153/45 mmHg (06/11 0800) SpO2:  [93 %-98 %] 94 % (06/11 0800) FiO2 (%):  [40 %] 40 % (06/11 0800) Weight:  [122 lb 2.2 oz (55.4 kg)] 122 lb 2.2 oz (55.4 kg) (06/11 0457)  VENTILATOR SETTINGS: Vent Mode:  [-] CPAP FiO2 (%):  [40 %] 40 % Set Rate:  [14 bmp] 14 bmp Vt Set:  [500 mL] 500 mL PEEP:  [5 cmH20-10 cmH20] 10 cmH20 Pressure Support:  [10 cmH20] 10 cmH20 Plateau Pressure:  [15 cmH20-18 cmH20] 15 cmH20  INTAKE / OUTPUT: Intake/Output     06/10 0701 - 06/11 0700 06/11 0701 - 06/12 0700   I.V. (mL/kg) 746.9 (13.5)    NG/GT 965 35   IV Piggyback 150    Total Intake(mL/kg) 1861.9 (33.6) 35 (0.6)   Urine (mL/kg/hr) 3350 (2.5) 160 (1.3)   Emesis/NG output     Total Output 3350 160   Net -1488.1 -125        Stool Occurrence      PHYSICAL EXAMINATION: General: ill appearing, awake on vent HEENT: ETT, OG  in place Heart: regular, 2/6 SM Lungs: scattered rhonchi/rales, mild exp wheeze Abdomen: soft, non tender Musculoskeletal: no edema Skin: No rashes or lesions Neuro: awake, alert, writing notes, MAE  LABS: CBC Recent Labs     11/27/13  0515  11/28/13  0435  WBC  9.0  8.9  HGB  11.9*  12.2  HCT  36.4  37.5  PLT  264  302   BMET Recent Labs     11/26/13  2030  11/27/13  0515  11/28/13  0435  NA  135*  135*  137  K  3.0*  4.6  3.6*  CL  90*  93*  89*  CO2  35*  35*  40*  BUN  15  18  26*  CREATININE  0.43*  0.44*  0.43*  GLUCOSE  113*  115*  101*    Electrolytes Recent Labs     11/26/13  2030  11/27/13  0515  11/28/13  0435  CALCIUM  8.5  8.6  9.0  MG  1.6  2.3  1.8  PHOS  2.7  2.3  3.3   ABG Recent Labs     11/25/13  1244  11/25/13  1524  11/28/13  0645  PHART  7.209*  7.392  7.507*  PCO2ART  47.9*  51.2*  50.2*  PO2ART  128.0*  352.0*  62.7*   Glucose Recent Labs     11/27/13  0817  11/27/13  1224  11/27/13  1532  11/27/13  1917  11/28/13  0028  11/28/13  0328  GLUCAP  127*  122*  114*  142*  114*  115*    Imaging Dg Chest Port 1 View  11/28/2013   CLINICAL DATA:  Pneumonia.  CHF.  EXAM: PORTABLE CHEST - 1 VIEW  COMPARISON:  11/27/2013  FINDINGS: No evidence of a pneumothorax.  Irregularly thickened interstitial markings are noted bilaterally with central vascular congestion. Hazy lung base opacity is likely due to small effusions with atelectasis. This is stable. Lungs remain hyperexpanded.  Cardiac silhouette is normal in size.  Normal mediastinal contours.  Endotracheal tube, nasogastric tube and left internal jugular central venous line are stable and well positioned.  IMPRESSION: 1. Lung hyperexpansion in diffuse bilateral interstitial thickening. Interstitial thickening may predominantly be chronic. A component of interstitial edema is suspected. Hazy basilar opacity is likely a combination of small effusions with atelectasis. Infiltrate is not  excluded. 2. Support apparatus is well positioned. 3. No change from the previous day's study.   Electronically Signed   By: Lajean Manes M.D.   On: 11/28/2013 07:19   Dg Chest Port 1 View  11/27/2013   CLINICAL DATA:  Congestive heart failure.  EXAM: PORTABLE CHEST - 1 VIEW  COMPARISON:  11/26/2013.  FINDINGS: Endotracheal tube, NG tube, central line in stable position. Cardiomegaly and pulmonary venous congestion with interstitial edema again noted. Small bilateral pleural effusions noted. Pulmonary interstitial edema and pleural effusions have improved slightly from prior exam. Linear density noted in the left pulmonary apex most likely is related to overlying respiratory apparatus as opposed to a pneumothorax. Lung markings are noted distal to this linear density. This region however merits attention on follow-up chest x-ray. No acute osseous abnormality.  IMPRESSION: 1. Congestive heart failure and pulmonary interstitial edema with bilateral pleural effusions. Slight improvement from prior exam. 2. Line tube positions stable. 3. Subtle linear density noted over the left pulmonary apex most likely represents overlying respiratory apparatus as opposed to a subtle pneumothorax. Lung markings are noted distal to this linear density. Nevertheless this region merits attention on follow-up chest x-ray.   Electronically Signed   By: Marcello Moores  Register   On: 11/27/2013 07:27      ASSESSMENT / PLAN:  PULMONARY A: Acute hypoxic/hypercapnic respiratory failure 2nd to AECOPD, Parainfluenza PNA, and pulmonary edema. P:   Proceed with extubation 6/11 Scheduled brovana/ipratropium/pulmicort Prn albuterol Continue solumedrol to 20 mg bid F/u CXR intermittently Bronchial hygiene Oxygen to keep SpO2 > 92%  CARDIOVASCULAR A:  Hypotension from septic shock >> resolved. Chronic systolic/diastolic heart failure. Aortic stenosis. P:  Goal negative fluid balance as tolerated >> continue IV lasix until able to  swallow pills Continue losartan, ASA Cards following - plan cath early next week if clinically improved  RENAL A:   No acute issues. P:   Monitor renal fx, urine outpt. electrolytes  GASTROINTESTINAL A:   Nutrition. P:   May need speech assessment for swallow evaluation after extubation Protonix for SUP  HEMATOLOGIC A:  No issues. P:  Lovenox for DVT prevention F/u CBC intermittently  INFECTIOUS A:   Parainfluenza PNA with ?bacterial superinfection. P:   D/c levaquin 6/11  ENDOCRINE A:   Steroid induced hyperglycemia. P:   SSI while on steroids  NEUROLOGIC A:   Acute encephalopathy 2nd to respiratory failure >> improved. Deconditioning. P:  Continue klonopin qhs Mobilize after extubation  Discussed at length with pt and extensive family at bedside.  Pt has made it very clear that she does not want trach or reintubation.  Family all in agreement.  She is anxious to get ETT out. Will change to limited code, DNI, plan one way extubation.  Hope she will do well but if she has distress will transition to comfort care.     Nickolas Madrid, NP 11/28/2013  9:18 AM Pager: [phone] or [phone]  Reviewed above, examined, and documentation changes made as needed.  She is medically ready for extubation.  Her mental status is intact.  She was very clear in writing that she would not want re-intubation or tracheostomy if her breathing gets worse after extubation.  Family members were present for this discussion.  CC time 35 minutes.  Chesley Mires, MD [facility] Pulmonary/Critical Care 11/28/2013, 9:37 AM Pager:  [phone] After 3pm call: [phone]

## 2013-11-28 NOTE — Progress Notes (Signed)
    Subjective:  Intubated but awake and responds appropriately. Denies pain.   Objective:  Vital Signs in the last 24 hours: Temp:  [97.7 F (36.5 C)-99.3 F (37.4 C)] 99.3 F (37.4 C) (06/11 0600) Pulse Rate:  [51-80] 61 (06/11 0343) Resp:  [14-22] 14 (06/11 0600) BP: (113-163)/(30-58) 150/35 mmHg (06/11 0600) SpO2:  [93 %-98 %] 93 % (06/11 0600) FiO2 (%):  [40 %] 40 % (06/11 0600) Weight:  [55.4 kg (122 lb 2.2 oz)] 55.4 kg (122 lb 2.2 oz) (06/11 0457)  Intake/Output from previous day: 06/10 0701 - 06/11 0700 In: 1861.9 [I.V.:746.9; NG/GT:965; IV Piggyback:150] Out: 3350 [Urine:3350]  Physical Exam: Pt is alert, NAD, ET tube in place HEENT: normal Neck: JVP - normal Lungs: CTA bilaterally CV: RRR with 2/6 harsh mid-peaking systolic murmur at the LSB Abd: soft, NT, Positive BS Ext: no C/C/E, distal pulses intact and equal Skin: warm/dry no rash   Lab Results:  Recent Labs  11/27/13 0515 11/28/13 0435  WBC 9.0 8.9  HGB 11.9* 12.2  PLT 264 302    Recent Labs  11/27/13 0515 11/28/13 0435  NA 135* 137  K 4.6 3.6*  CL 93* 89*  CO2 35* 40*  GLUCOSE 115* 101*  BUN 18 26*  CREATININE 0.44* 0.43*   No results found for this basename: TROPONINI, CK, MB,  in the last 72 hours  Cardiac Studies: 2D Echo: Study Conclusions  - Left ventricle: The cavity size was normal. There was severe concentric hypertrophy. Systolic function was mildly reduced. The estimated ejection fraction was in the range of 45% to 50%. - Aortic valve: Inadequate Doppler study. Suspect at least moderate aortic stenosis, possibly severe by 2D valve appearance. Consider TEE. - Mitral valve: Severely calcified annulus. - Left atrium: The atrium was moderately to severely dilated. - Atrial septum: The septum bowed from left to right, consistent with increased left atrial pressure. - Pulmonary arteries: PA peak pressure: 48 mm Hg (S).  Tele: Sinus rhythm  Assessment/Plan:  Valvular  heart disease with "at least" moderate aortic stenosis and heavily calcified aortic and mitral valves  LV dysfunction, EF 45-50%  Chronic LBBB  Acute on chronic respiratory failure  End-stage COPD  Parainfluenza PNA  Reviewed consult note of Dr Debara Pickett from 6/10 and I agree with the overall plan of care he has outlined. Pt with LV dysfunction, aortic stenosis which I suspect is no worse than moderate, and high-probability of obstructive CAD. Discussed with family that we would likely perform cardiac cath next week pending clinical improvement/extubation. Will continue to follow. Does not appear to have significant volume overload at this time.  [name] Mocha, M.D. 11/28/2013, 7:29 AM

## 2013-11-28 NOTE — Progress Notes (Signed)
CRITICAL VALUE ALERT  Critical value received: CO2= 40  Date of notification:  11/28/13  Time of notification:  0611  Critical value read back:yes  Nurse who received alert:  Jari Favre RN  MD notified (1st page):  E-Link  Time of first page:  0611  MD notified (2nd page):  Time of second page:  Responding MD:  Dr Loralyn Freshwater  Time MD responded:  [phone], new orders received

## 2013-11-28 NOTE — Progress Notes (Signed)
Dr Burt Knack called concerning inability to give po cardizem.  Per order will hold medication for now.

## 2013-11-28 NOTE — Progress Notes (Signed)
ANTIBIOTIC CONSULT NOTE - Follow up  Pharmacy Consult: levofloxacin Indication: pneumonia  Allergies  Allergen Reactions  . Tetanus Toxoid Anaphylaxis  . Codeine Nausea And Vomiting and Other (See Comments)    Itching and faint   . Erythromycin Nausea And Vomiting  . Meloxicam Itching  . Methylprednisolone Hives  . Penicillins Hives  . Prednisone Other (See Comments)    GI bleed    73 yoF admitted 6/5 from PCP office with hypoxia, dry and productive cough, and subjective fevers. Pt with hx COPD. Initially, pharmacy consulted to dose levofloxacin and vancomycin for suspected pneumonia. Levofloxacin changed to cefepime overnight 6/6, then to levofloxacin monotherapy 6/8   6/5 >> levofloxacin >> 6/6, resume 6/8 6/5 >> vancomycin >> 6/8 6/6 >> cefepime>> 6/8  Tmax: afeb WBCs: WNL Renal: SCr low (baseline 0.4-0.5), CrCl 46 ml/min CG, 69 ml/min normalized (both using SCr=0.8), UOP good Lactic acid: 1.51 (6/5), 0.9 (6/6) PCT: < 0.1 (6/8)  6/5 blood x2: ngtd 6/5 S pneumo Ur Ag: negative 6/5 Legionella ur Ag: negative 6/5 urine: 30K multiple bacterial morphologies 6/5 MRSA PCR: negative 6/6 resp virus panel: parainfluenza 3 6/6 resp Cx: Nl flora  Goal of Therapy:  Levofloxacin per indication and renal function  Plan:  Day #4 Levofloxacin, Day #7 total antibiotics  Levofloxacin 750mg  IV q24h - suspect antibiotics to be stopped after today per CCM notes plan is 7-day course  Doreene Eland, PharmD, BCPS.   Pager: [phone] 11/28/2013 8:29 AM

## 2013-11-28 NOTE — Progress Notes (Signed)
Patient ID: SANAYAH MUNRO, female   DOB: Feb 14, 1941, 73 y.o.   MRN: [id number] \  eLink Physician Progress Note and Electrolyte Replacement  Patient Name: Jill White DOB: 06-05-1941 MRN: [id number]  Date of Service  11/28/2013   HPI/Events of Note    Recent Labs Lab 11/25/13 0454 11/26/13 0500 11/26/13 2030 11/27/13 0515 11/28/13 0435  NA 133* 131* 135* 135* 137  K 4.4 4.4 3.0* 4.6 3.6*  CL 95* 93* 90* 93* 89*  CO2 31 30 35* 35* 40*  GLUCOSE 98 132* 113* 115* 101*  BUN 7 10 15 18  26*  CREATININE 0.39* 0.33* 0.43* 0.44* 0.43*  CALCIUM 8.6 8.3* 8.5 8.6 9.0  MG 1.8 1.7 1.6 2.3 1.8  PHOS 2.5 2.7 2.7 2.3 3.3    Estimated Creatinine Clearance: 45.8 ml/min (by C-G formula based on Cr of 0.43).  Intake/Output     06/10 0701 - 06/11 0700   I.V. (mL/kg) 746.9 (13.5)   NG/GT 965   IV Piggyback 150   Total Intake(mL/kg) 1861.9 (33.6)   Urine (mL/kg/hr) 3350 (2.5)   Total Output 3350   Net -1488.1       Stool Occurrence     - I/O DETAILED x 24h    Total I/O In: 781.8 [I.V.:366.8; NG/GT:415] Out: 500 [Urine:500] - I/O THIS SHIFT    ASSESSMENT High pco2 Low mag Low k  eICURN Interventions  Check abg Replete mag Replete k   ASSESSMENT: MAJOR ELECTROLYTE      Dr. Brand Males, M.D., [facility].C.P Pulmonary and Critical Care Medicine Staff Physician [place] [facility]monary and Critical Care Pager: [phone], If no answer or between  15:00h - 7:00h: call [phone]  11/28/2013 6:38 AM

## 2013-11-28 NOTE — Progress Notes (Signed)
Leeroy Cha, RN Case Manager Deleted CASE MANAGEMENT Progress Notes Service date: 11/28/2013 11:24 AM  CARE MANAGEMENT NOTE 11/28/2013   Patient:  Zaremba,Jericca L   Account Number:  [id number]   Date Initiated:  11/25/2013  Documentation initiated by:  DAVIS,RHONDA   Subjective/Objective Assessment:    pt with hx of copd admitted for excerbation intubated until [id number], fi02 at 40%        Action/Plan:    pt does live alone may need hhc or placement      Anticipated DC Date:  12/01/2013   Anticipated DC Plan:  [place]    In-house referral   NA         DC Planning Services   NA         [facility] Choice   NA      Choice offered to / List presented to:  NA            Status of service:  In process, will continue to follow Medicare Important Message given?  NA - LOS <3 / Initial given by admissions (If response is "NO", the following Medicare IM given date fields will be blank) Date Medicare IM given:  11/27/2013 Date Additional Medicare IM given:      Discharge Disposition:      Per UR Regulation:  Reviewed for med. necessity/level of care/duration of stay   If discussed at [place] of Stay Meetings, dates discussed:     Comments:  [id number]/Rhonda Eldridge Dace, [place], [place] [id number] Chart Reviewed for discharge and hospital needs. Discharge needs at time of review: None present will follow for needs. patient extubated -one way made dni, and partial code will foolow to see progression. Review of patient progress due on [id number].     [id number]/[id number] Rosana Hoes RN, BSN, [place] [phone] Chart Reviewed for discharge and hospital needs. Discharge needs at time of review: None present will follow for needs. Review of patient progress due on [id number].       Deleted by: Leeroy Cha, RN    [11/28/2013 11:29 AM]  Chart Correction History...     Date/Time User Action   11/28/2013 11:29 AM Suanne Marker Barrie Dunker, RN Delete Data

## 2013-11-28 NOTE — Progress Notes (Deleted)
CARE MANAGEMENT NOTE 11/28/2013  Patient:  MARTINIQUE, PIZZIMENTI   Account Number:  [id number]  Date Initiated:  11/25/2013  Documentation initiated by:  Moselle Rister  Subjective/Objective Assessment:   pt with hx of copd admitted for excerbation intubated until [id number], fi02 at 40%     Action/Plan:   pt does live alone may need hhc or placement   Anticipated DC Date:  12/01/2013   Anticipated DC Plan:  [place]  In-house referral  NA      DC Planning Services  NA      [facility] Choice  NA   Choice offered to / List presented to:  NA           Status of service:  In process, will continue to follow Medicare Important Message given?  NA - LOS <3 / Initial given by admissions (If response is "NO", the following Medicare IM given date fields will be blank) Date Medicare IM given:  11/27/2013 Date Additional Medicare IM given:    Discharge Disposition:    Per UR Regulation:  Reviewed for med. necessity/level of care/duration of stay  If discussed at [place] of Stay Meetings, dates discussed:    Comments:  [id number]/Schylar Wuebker Eldridge Dace, [place], [place] [phone] Chart Reviewed for discharge and hospital needs. Discharge needs at time of review: None present will follow for needs. patient extubated -one way made dni, and partial code will foolow to see progression. Review of patient progress due on [id number].   [id number]/ONGEXB Rosana Hoes RN, BSN, [place] [phone] Chart Reviewed for discharge and hospital needs. Discharge needs at time of review: None present will follow for needs. Review of patient progress due on [id number].

## 2013-11-29 ENCOUNTER — Inpatient Hospital Stay (HOSPITAL_COMMUNITY): Payer: Medicare Other

## 2013-11-29 DIAGNOSIS — I5041 Acute combined systolic (congestive) and diastolic (congestive) heart failure: Secondary | ICD-10-CM

## 2013-11-29 DIAGNOSIS — I447 Left bundle-branch block, unspecified: Secondary | ICD-10-CM

## 2013-11-29 DIAGNOSIS — I509 Heart failure, unspecified: Secondary | ICD-10-CM

## 2013-11-29 LAB — CBC
HEMATOCRIT: 40.2 % (ref 36.0–46.0)
Hemoglobin: 13.4 g/dL (ref 12.0–15.0)
MCH: 32.1 pg (ref 26.0–34.0)
MCHC: 33.3 g/dL (ref 30.0–36.0)
MCV: 96.2 fL (ref 78.0–100.0)
Platelets: 353 10*3/uL (ref 150–400)
RBC: 4.18 MIL/uL (ref 3.87–5.11)
RDW: 14.3 % (ref 11.5–15.5)
WBC: 11 10*3/uL — ABNORMAL HIGH (ref 4.0–10.5)

## 2013-11-29 LAB — GLUCOSE, CAPILLARY
GLUCOSE-CAPILLARY: 157 mg/dL — AB (ref 70–99)
Glucose-Capillary: 103 mg/dL — ABNORMAL HIGH (ref 70–99)
Glucose-Capillary: 90 mg/dL (ref 70–99)
Glucose-Capillary: 89 mg/dL (ref 70–99)
Glucose-Capillary: 114 mg/dL — ABNORMAL HIGH (ref 70–99)
Glucose-Capillary: 118 mg/dL — ABNORMAL HIGH (ref 70–99)

## 2013-11-29 LAB — BASIC METABOLIC PANEL
BUN: 17 mg/dL (ref 6–23)
CO2: 38 mEq/L — ABNORMAL HIGH (ref 19–32)
CREATININE: 0.39 mg/dL — AB (ref 0.50–1.10)
Calcium: 9.1 mg/dL (ref 8.4–10.5)
Chloride: 92 mEq/L — ABNORMAL LOW (ref 96–112)
GFR calc Af Amer: >90 mL/min (ref >90)
Glucose, Bld: 95 mg/dL (ref 70–99)
Potassium: 3.2 mEq/L — ABNORMAL LOW (ref 3.7–5.3)
Sodium: 137 mEq/L (ref 137–147)

## 2013-11-29 MED ORDER — POTASSIUM CHLORIDE 10 MEQ/50ML IV SOLN
10.0000 meq | INTRAVENOUS | Status: AC
  Administered 2013-11-29 (×2): 10 meq via INTRAVENOUS
  Filled 2013-11-29 (×2): qty 50

## 2013-11-29 MED ORDER — POTASSIUM CHLORIDE 10 MEQ/50ML IV SOLN
10.0000 meq | INTRAVENOUS | Status: AC
  Administered 2013-11-29 (×4): 10 meq via INTRAVENOUS
  Filled 2013-11-29 (×4): qty 50

## 2013-11-29 MED ORDER — ENSURE COMPLETE PO LIQD
237.0000 mL | Freq: Two times a day (BID) | ORAL | Status: DC
  Administered 2013-11-29 – 2013-12-03 (×8): 237 mL via ORAL

## 2013-11-29 MED ORDER — DILTIAZEM HCL ER COATED BEADS 120 MG PO CP24
120.0000 mg | ORAL_CAPSULE | Freq: Every day | ORAL | Status: DC
  Administered 2013-11-29 – 2013-12-03 (×5): 120 mg via ORAL
  Filled 2013-11-29 (×5): qty 1

## 2013-11-29 NOTE — Progress Notes (Signed)
PULMONARY / CRITICAL CARE MEDICINE   Name: Jill White MRN: [id number] DOB: 1940-12-14    ADMISSION DATE:  11/22/2013 CONSULTATION DATE:  11/23/13  REFERRING MD :  Dr. Arnoldo Morale  CHIEF COMPLAINT:  Short of breath  BRIEF PATIENT DESCRIPTION:  73 yo female smoker admitted with AECOPD.  Intubated 6/06 and PCCM assumed care in ICU.  SIGNIFICANT EVENTS: 6/05 Admit 6/06 VDRF 6/08 Re-intubated 6/10 Cardiology consulted  STUDIES:  Echo 6/06 >> EF 45 to 50%, severe LVH, mod AS, mod/severe LA dilation, PAS 48 mmHg  LINES / TUBES: Left IJ CVC6/6>>> ETT 6/6>>>6/7 L radial a-line 6/6>>>out ETT 6/8>>6/11  CULTURES: Blood cultures 6/6>>> neg  Tracheal aspirate 6/6>>>oral flora Urine culture 6/6>>>negative Respiratory viral panel >> Parainfluenza 3  ANTIBIOTICS: Cefepime 6/6>>>6/8 Levaquin 6/6>>>off Vancomycin 6/6>>>6/8 Levaquin 6/08 >>>6/11  SUBJECTIVE:  No distress, up in chair  VITAL SIGNS: Temp:  [98.2 F (36.8 C)-100 F (37.8 C)] 99 F (37.2 C) (06/12 0747) Resp:  [15-24] 24 (06/12 0747) BP: (135-199)/(38-64) 199/64 mmHg (06/12 0747) SpO2:  [89 %-100 %] 92 % (06/12 0813) FiO2 (%):  [40 %-50 %] 50 % (06/11 1400) Weight:  [52.3 kg (115 lb 4.8 oz)] 52.3 kg (115 lb 4.8 oz) (06/12 0400) 3 liters    INTAKE / OUTPUT: Intake/Output     06/11 0701 - 06/12 0700 06/12 0701 - 06/13 0700   I.V. (mL/kg) 68.3 (1.3)    Other 450    NG/GT 105    IV Piggyback 150    Total Intake(mL/kg) 773.3 (14.8)    Urine (mL/kg/hr) 3210 (2.6)    Total Output 3210     Net -2436.7           PHYSICAL EXAMINATION: General: no distress  HEENT:[place] no JVd  Heart: irregular irreg , 2/6 SM Lungs: diffuse rales  Abdomen: soft, non tender Musculoskeletal: no edema Skin: No rashes or lesions Neuro: awake, alert, writing notes, MAE  LABS: CBC Recent Labs     11/27/13  0515  11/28/13  0435  11/29/13  0420  WBC  9.0  8.9  11.0*  HGB  11.9*  12.2  13.4  HCT  36.4  37.5  40.2  PLT  264   302  353   BMET Recent Labs     11/27/13  0515  11/28/13  0435  11/29/13  0420  NA  135*  137  137  K  4.6  3.6*  3.2*  CL  93*  89*  92*  CO2  35*  40*  38*  BUN  18  26*  17  CREATININE  0.44*  0.43*  0.39*  GLUCOSE  115*  101*  95    Electrolytes Recent Labs     11/26/13  2030  11/27/13  0515  11/28/13  0435  11/29/13  0420  CALCIUM  8.5  8.6  9.0  9.1  MG  1.6  2.3  1.8   --   PHOS  2.7  2.3  3.3   --    ABG Recent Labs     11/28/13  0645  PHART  7.507*  PCO2ART  50.2*  PO2ART  62.7*   Glucose Recent Labs     11/28/13  1133  11/28/13  1539  11/28/13  1956  11/29/13  0011  11/29/13  0458  11/29/13  0733  GLUCAP  128*  95  107*  103*  90  89    Imaging Dg Chest Port 1 View  11/29/2013  CLINICAL DATA:  Respiratory failure  EXAM: PORTABLE CHEST - 1 VIEW  COMPARISON:  11/28/2013  FINDINGS: A left jugular central line is again noted and stable. The endotracheal tube and nasogastric catheter have been removed in the interval. Diffuse interstitial changes are again identified with mild interstitial edema. No focal confluent infiltrate is seen. No acute bony abnormality is noted.  IMPRESSION: Stable interstitial changes bilaterally.   Electronically Signed   By: Inez Catalina M.D.   On: 11/29/2013 07:20   Dg Chest Port 1 View  11/28/2013   CLINICAL DATA:  Pneumonia.  CHF.  EXAM: PORTABLE CHEST - 1 VIEW  COMPARISON:  11/27/2013  FINDINGS: No evidence of a pneumothorax.  Irregularly thickened interstitial markings are noted bilaterally with central vascular congestion. Hazy lung base opacity is likely due to small effusions with atelectasis. This is stable. Lungs remain hyperexpanded.  Cardiac silhouette is normal in size.  Normal mediastinal contours.  Endotracheal tube, nasogastric tube and left internal jugular central venous line are stable and well positioned.  IMPRESSION: 1. Lung hyperexpansion in diffuse bilateral interstitial thickening. Interstitial thickening  may predominantly be chronic. A component of interstitial edema is suspected. Hazy basilar opacity is likely a combination of small effusions with atelectasis. Infiltrate is not excluded. 2. Support apparatus is well positioned. 3. No change from the previous day's study.   Electronically Signed   By: Lajean Manes M.D.   On: 11/28/2013 07:19  improving aeration     ASSESSMENT / PLAN:  PULMONARY A: Acute hypoxic/hypercapnic respiratory failure 2nd to AECOPD, Parainfluenza PNA, and pulmonary edema. Extubated 6/11, CXR improving  P:   Scheduled brovana/ipratropium/pulmicort Prn albuterol Continue solumedrol to 20 mg bid, transition to pred after passes swallow eval F/u CXR intermittently Bronchial hygiene Oxygen to keep SpO2 > 92%  CARDIOVASCULAR A:  Hypotension from septic shock >> resolved. Chronic systolic/diastolic heart failure. AF w/ RVR Aortic stenosis. P:  Goal negative fluid balance as tolerated >> continue IV lasix until able to swallow pills dilt gtt, convert to oral after passes eval  Continue losartan, ASA Cards following - plan cath early next week if clinically improved  RENAL A:   Hypokalemia  P:   Monitor renal fx, urine outpt. Replace electrolytes as needed  GASTROINTESTINAL A:   Nutrition. P:   slp eval pending  Protonix for SUP  HEMATOLOGIC A:  No issues. P:  Lovenox for DVT prevention F/u CBC intermittently  INFECTIOUS A:   Parainfluenza PNA with ?bacterial superinfection. abx d/c 6/11 P:   F/u fever curve and wbc  ENDOCRINE A:   Steroid induced hyperglycemia. P:   SSI while on steroids  NEUROLOGIC A:   Acute encephalopathy 2nd to respiratory failure >> improved. Deconditioning. P:   Continue klonopin qhs Mobilize   Looks better. Will keep in SDU setting for now    Reviewed above, examined, and agree.  Much improved.  Cardiology arranging for cardiac cath.  Chesley Mires, MD [facility] Pulmonary/Critical Care 11/29/2013,  10:42 AM Pager:  [phone] After 3pm call: [phone]

## 2013-11-29 NOTE — Progress Notes (Signed)
    Subjective:  No chest pain. Breathing improved. No dyspnea at rest this am.  Objective:  Vital Signs in the last 24 hours: Temp:  [98.2 F (36.8 C)-100 F (37.8 C)] 98.6 F (37 C) (06/12 0845) Resp:  [15-24] 20 (06/12 0845) BP: (135-199)/(38-64) 166/47 mmHg (06/12 0845) SpO2:  [89 %-100 %] 94 % (06/12 0845) FiO2 (%):  [50 %] 50 % (06/11 1400) Weight:  [52.3 kg (115 lb 4.8 oz)] 52.3 kg (115 lb 4.8 oz) (06/12 0400)  Intake/Output from previous day: 06/11 0701 - 06/12 0700 In: 788.3 [I.V.:73.3; NG/GT:105; IV Piggyback:150] Out: 9604 [Urine:3210]  Physical Exam: Pt is alert and oriented, chronically ill-appearing in NAD HEENT: normal Neck: JVP - normal, carotids 2+= without bruits Lungs: CTA bilaterally but diminished breath sounds CV: RRR with 2/6 systolic murmur at LSB Abd: soft, NT, Positive BS, no hepatomegaly Ext: no C/C/E Skin: warm/dry no rash   Lab Results:  Recent Labs  11/28/13 0435 11/29/13 0420  WBC 8.9 11.0*  HGB 12.2 13.4  PLT 302 353    Recent Labs  11/28/13 0435 11/29/13 0420  NA 137 137  K 3.6* 3.2*  CL 89* 92*  CO2 40* 38*  GLUCOSE 101* 95  BUN 26* 17  CREATININE 0.43* 0.39*   No results found for this basename: TROPONINI, CK, MB,  in the last 72 hours  Cardiac Studies: 2D Echo: Study Conclusions  - Left ventricle: The cavity size was normal. There was severe concentric hypertrophy. Systolic function was mildly reduced. The estimated ejection fraction was in the range of 45% to 50%. - Aortic valve: Inadequate Doppler study. Suspect at least moderate aortic stenosis, possibly severe by 2D valve appearance. Consider TEE. - Mitral valve: Severely calcified annulus. - Left atrium: The atrium was moderately to severely dilated. - Atrial septum: The septum bowed from left to right, consistent with increased left atrial pressure. - Pulmonary arteries: PA peak pressure: 48 mm Hg (S).  Impressions:  - Compared to the study from  2014, LVEF has diminished, there is evidence of hypervolemia and aortic stenosis of unclear severity.  Tele: Sinus rhythm, episodes of wide complex tach with LBBB morphology - likely atach with abberancy  Assessment/Plan:  1. Acute mixed systolic and diastolic CHF, appears euvolemic 2. VDRF, now extubated 3. Intermittent LBBB 4. Wide complex tachycardia suspect A tach with rate related LBBB  She has responded well to IV dilt at low-dose. Continue until she can take PO then change to oral dilt CD 120 mg daily. Anticipate cath Monday to eval new LV dysfunction, aortic stenosis in this patient at high-risk of obstructive CAD. Reviewed risks, indications, and alternatives. She agrees to proceed.   [name] Mocha, M.D. 11/29/2013, 9:29 AM

## 2013-11-29 NOTE — Progress Notes (Signed)
eLink Physician-Brief Progress Note Patient Name: Jill White DOB: Oct 28, 1940 MRN: [id number]  Date of Service  11/29/2013   HPI/Events of Note    Recent Labs Lab 11/26/13 0500 11/26/13 2030 11/27/13 0515 11/28/13 0435 11/29/13 0420  K 4.4 3.0* 4.6 3.6* 3.2*   Estimated Creatinine Clearance: 45.8 ml/min (by C-G formula based on Cr of 0.39).   Recent Labs Lab 11/26/13 0500 11/26/13 2030 11/27/13 0515 11/28/13 0435 11/29/13 0420  CREATININE 0.33* 0.43* 0.44* 0.43* 0.39*      eICU Interventions  IV KCL 63meq x 2 runs   Intervention Category Intermediate Interventions: Electrolyte abnormality - evaluation and management  Jaleena Viviani 11/29/2013, 6:37 AM

## 2013-11-29 NOTE — Progress Notes (Signed)
NUTRITION FOLLOW UP Intervention:   - Consider liberalizing diet to regular. - Ensure Complete po BID, each supplement provides 350 kcal and 13 grams of protein - RD to continue to monitor   Nutrition Dx:   Predicted suboptimal energy intake related to respiratory status as evidenced by recently intubated - no longer appropriate, re-intubated  New nutrition dx: Inadequate oral intake related to inability to eat as evidenced by NPO.   Goal:   Patient will meet >/=90% of estimated nutrition needs - not met but will be met with TF at goal rate in addition to calories from Propofol   Monitor:   Weights, labs, TF tolerance, vent status   Assessment:   73 year old female with history of COPD, hypertension, presented to the ER with shortness of breath and wheezing.   6/7: - Patient was intubated 6/6, and consult received for enteral nutrition. Saturday RD started Vital High Protein at goal rate of 40 ml/hr per adult enteral nutrition protocol.  - Patient extubated this AM. TF discontinued. Diet advanced to Heart Healthy, to be started at 2pm  - Patient reports that her appetite was good prior to admission. She denies weight loss. No signs of muscle or subcutaneous fat wasting.   6/9: - Re-intubated 6/8 for respiratory insufficiency  - Received consult for TF management  6/12:   - extubated on heart healthy diet with decreased intake but tolerating.   -  Swallow evaluated by SLP today with recs for a regular thin liquid diet. -   Discussed need to add Ensure with patient due to decreased intake.  Daughter encouraged. -Assisted with meal ordering.  Height: Ht Readings from Last 1 Encounters:  11/25/13 $RemoveB'5\' 1"'hRIpHfHc$  (1.549 m)    Weight Status:   Wt Readings from Last 1 Encounters:  11/29/13 115 lb 4.8 oz (52.3 kg)  Admit wt         121 lb 7.6 oz (55.1 kg)  Re-estimated needs:  Kcal: 1350-1550 Protein: 66-83g Fluid: per MD  Skin: Intact   Diet Order: Cardiac   Intake/Output  Summary (Last 24 hours) at 11/29/13 1704 Last data filed at 11/29/13 1515  Gross per 24 hour  Intake 583.33 ml  Output   2600 ml  Net -2016.67 ml    Last BM: PTA   Labs:   Recent Labs Lab 11/26/13 2030 11/27/13 0515 11/28/13 0435 11/29/13 0420  NA 135* 135* 137 137  K 3.0* 4.6 3.6* 3.2*  CL 90* 93* 89* 92*  CO2 35* 35* 40* 38*  BUN 15 18 26* 17  CREATININE 0.43* 0.44* 0.43* 0.39*  CALCIUM 8.5 8.6 9.0 9.1  MG 1.6 2.3 1.8  --   PHOS 2.7 2.3 3.3  --   GLUCOSE 113* 115* 101* 95    CBG (last 3)   Recent Labs  11/29/13 0011 11/29/13 0458 11/29/13 0733  GLUCAP 103* 90 89    Scheduled Meds: . antiseptic oral rinse  15 mL Mouth Rinse QID  . arformoterol  15 mcg Nebulization Q12H  . aspirin  81 mg Per Tube Daily  . budesonide (PULMICORT) nebulizer solution  0.5 mg Nebulization Q12H  . chlorhexidine  15 mL Mouth Rinse BID  . clonazePAM  1 mg Oral QHS  . diltiazem  120 mg Oral Daily  . enoxaparin (LOVENOX) injection  40 mg Subcutaneous Q24H  . furosemide  40 mg Intravenous Daily  . insulin aspart  0-20 Units Subcutaneous 6 times per day  . ipratropium  0.5 mg  Nebulization Q12H  . losartan  50 mg Oral Daily  . methylPREDNISolone (SOLU-MEDROL) injection  20 mg Intravenous Q12H  . pantoprazole (PROTONIX) IV  40 mg Intravenous Q24H    Continuous Infusions:    Antonieta Iba, RD, LDN Clinical Inpatient Dietitian Pager:  [phone] Weekend and after hours pager:  [phone]

## 2013-11-29 NOTE — Evaluation (Signed)
Clinical/Bedside Swallow Evaluation Patient Details  Name: JACI DESANTO MRN: [id number] Date of Birth: 1940/08/02  Today's Date: 11/29/2013 Time: 1237-1259 SLP Time Calculation (min): 22 min  Past Medical History:  Past Medical History  Diagnosis Date  . HIP PAIN   . HYPERTENSION   . Restless leg syndrome   . COPD (chronic obstructive pulmonary disease)     a. Multiple prior admissions for acute hypoxic respiratory failure in setting of COPD exacerbations +/- pneumonia.  . Peptic ulcer disease     a. 2013: gastric antrum.  Marland Kitchen Hyponatremia   . Tobacco abuse   . Hypomagnesemia   . LBBB (left bundle branch block)   . Pulmonary HTN     a. Elevated PA pressure by prior echoes.  . Mitral regurgitation     a. Mild MR by echo 2014, not mentioned on 11/2013 (severely calcified annulus).   Past Surgical History:  Past Surgical History  Procedure Laterality Date  . Abdominal hysterectomy    . Dilation and curettage of uterus    . Tonsillectomy    . Esophagogastroduodenoscopy  07/09/2011    Procedure: ESOPHAGOGASTRODUODENOSCOPY (EGD);  Surgeon: Missy Sabins, MD;  Location: [facility];  Service: Endoscopy;  Laterality: N/A;  patient in room 1532  . Cataract extraction w/ intraocular lens  implant, bilateral Bilateral 03/2013    Patient claims it was within the month.    HPI:  73 yo female adm to [facility] with AECOPD, requiring intubation 6/6-6/8 and reintubated 6/8-6/11.  BSE ordered.  Pt denies h/o dysphagia nor GERD.  CXR stable interstial changes bilaterally.      Assessment / Plan / Recommendation Clinical Impression  Pt with negative cranial nerve exam impacting nerves responsible for oropharyngeal swallow ability.   Her voice was mildly hoarse but no indications of airway compromise noted with po intake observed.  SLP observed pt consuming juice via cup/straw, crackers and applesauce.  Pt used caution with eating/drinking and tolerated well without increased dyspnea.  Educated pt and  daughter present to recommendations, compensation strategies to mitigate episodic aspiration risk given pt's COPD.  Also reviewed alternative ways to take medications for increased ease.  Recommend regular/thin diet - suspect swallow to improve to baseline with decreased pharyngeal edema.  SLP to sign off as all education completed.  Please reorder if indicated.      Aspiration Risk  Mild    Diet Recommendation Regular;Thin liquid   Liquid Administration via: Cup;Straw Medication Administration:  (as tolerated-defer to pt) Supervision: Patient able to self feed;Intermittent supervision to cue for compensatory strategies Compensations: Slow rate;Small sips/bites (rest breaks if dyspneic) Postural Changes and/or Swallow Maneuvers: Seated upright 90 degrees;Upright 30-60 min after meal    Other  Recommendations Oral Care Recommendations: Oral care BID   Follow Up Recommendations  None    Frequency and Duration          Swallow Study Prior Functional Status   see [place] Date of Onset: 11/29/13 HPI: 73 yo female adm to [facility] with AECOPD, requiring intubation 6/6-6/8 and reintubated 6/8-6/11.  BSE ordered.  Pt denies h/o dysphagia nor GERD.  CXR stable interstial changes bilaterally.    Type of Study: Bedside swallow evaluation Diet Prior to this Study: NPO Temperature Spikes Noted: No Respiratory Status: Nasal cannula History of Recent Intubation: Yes Date extubated: 11/28/13 Behavior/Cognition: Alert;Cooperative Oral Cavity - Dentition: Adequate natural dentition Self-Feeding Abilities: Able to feed self Patient Positioning: Upright in bed Baseline Vocal Quality: Low vocal intensity  Volitional Cough: Strong Volitional Swallow: Able to elicit    Oral/Motor/Sensory Function Overall Oral Motor/Sensory Function: Appears within functional limits for tasks assessed   Ice Chips Ice chips: Not tested   Thin Liquid Thin Liquid: Within functional limits Presentation: Self  Fed;Cup;Straw    Nectar Thick Nectar Thick Liquid: Not tested   Honey Thick Honey Thick Liquid: Not tested   Puree Puree: Within functional limits Presentation: Self Fed;Spoon   Solid   GO    Solid: Within functional limits Presentation: [place], [place], [place] [facility] SLP [phone]

## 2013-11-30 LAB — CBC
HCT: 41.6 % (ref 36.0–46.0)
Hemoglobin: 14.1 g/dL (ref 12.0–15.0)
MCH: 32.3 pg (ref 26.0–34.0)
MCHC: 33.9 g/dL (ref 30.0–36.0)
MCV: 95.2 fL (ref 78.0–100.0)
Platelets: 396 10*3/uL (ref 150–400)
RBC: 4.37 MIL/uL (ref 3.87–5.11)
RDW: 14.2 % (ref 11.5–15.5)
WBC: 10.1 10*3/uL (ref 4.0–10.5)

## 2013-11-30 LAB — GLUCOSE, CAPILLARY
GLUCOSE-CAPILLARY: 124 mg/dL — AB (ref 70–99)
GLUCOSE-CAPILLARY: 146 mg/dL — AB (ref 70–99)
Glucose-Capillary: 106 mg/dL — ABNORMAL HIGH (ref 70–99)
Glucose-Capillary: 184 mg/dL — ABNORMAL HIGH (ref 70–99)
Glucose-Capillary: 124 mg/dL — ABNORMAL HIGH (ref 70–99)
Glucose-Capillary: 124 mg/dL — ABNORMAL HIGH (ref 70–99)

## 2013-11-30 LAB — COMPREHENSIVE METABOLIC PANEL
ALK PHOS: 54 U/L (ref 39–117)
ALT: 42 U/L — AB (ref 0–35)
AST: 35 U/L (ref 0–37)
Albumin: 2.9 g/dL — ABNORMAL LOW (ref 3.5–5.2)
BILIRUBIN TOTAL: 0.6 mg/dL (ref 0.3–1.2)
BUN: 19 mg/dL (ref 6–23)
CALCIUM: 8.8 mg/dL (ref 8.4–10.5)
CO2: 35 mEq/L — ABNORMAL HIGH (ref 19–32)
Chloride: 89 mEq/L — ABNORMAL LOW (ref 96–112)
Creatinine, Ser: 0.34 mg/dL — ABNORMAL LOW (ref 0.50–1.10)
GFR calc non Af Amer: >90 mL/min (ref >90)
Glucose, Bld: 124 mg/dL — ABNORMAL HIGH (ref 70–99)
POTASSIUM: 4.3 meq/L (ref 3.7–5.3)
Sodium: 132 mEq/L — ABNORMAL LOW (ref 137–147)
Total Protein: 6 g/dL (ref 6.0–8.3)

## 2013-11-30 MED ORDER — SODIUM CHLORIDE 0.9 % IJ SOLN: 3.0000 mL | INTRAMUSCULAR | Status: DC | PRN

## 2013-11-30 MED ORDER — PREDNISONE 10 MG PO TABS
10.0000 mg | ORAL_TABLET | Freq: Two times a day (BID) | ORAL | Status: DC
  Administered 2013-11-30 – 2013-12-01 (×3): 10 mg via ORAL
  Filled 2013-11-30 (×6): qty 1

## 2013-11-30 MED ORDER — SODIUM CHLORIDE 0.9 % IV SOLN: 250.0000 mL | INTRAVENOUS | Status: DC | PRN

## 2013-11-30 MED ORDER — SODIUM CHLORIDE 0.9 % IJ SOLN
3.0000 mL | Freq: Two times a day (BID) | INTRAMUSCULAR | Status: DC
  Administered 2013-11-30 – 2013-12-01 (×3): 3 mL via INTRAVENOUS

## 2013-11-30 MED ORDER — SODIUM CHLORIDE 0.9 % IV SOLN
INTRAVENOUS | Status: DC
  Administered 2013-12-02 (×2): via INTRAVENOUS

## 2013-11-30 NOTE — Progress Notes (Signed)
PULMONARY / CRITICAL CARE MEDICINE   Name: Jill White MRN: [id number] DOB: 05/02/1941    ADMISSION DATE:  11/22/2013 CONSULTATION DATE:  11/23/13  REFERRING MD :  Dr. Arnoldo Morale  CHIEF COMPLAINT:  Short of breath  BRIEF PATIENT DESCRIPTION:  73 yo female smoker admitted with AECOPD.  Intubated 6/06 and PCCM assumed care in ICU.  SIGNIFICANT EVENTS: 6/05 Admit 6/06 VDRF 6/08 Re-intubated 6/10 Cardiology consulted  STUDIES:  Echo 6/06 >> EF 45 to 50%, severe LVH, mod AS, mod/severe LA dilation, PAS 48 mmHg  LINES / TUBES: Left IJ CVC6/6>>> ETT 6/6>>>6/7 L radial a-line 6/6>>>out ETT 6/8>>6/11  CULTURES: Blood cultures 6/6>>> neg  Tracheal aspirate 6/6>>>oral flora Urine culture 6/6>>>negative Respiratory viral panel >> Parainfluenza 3  ANTIBIOTICS: Cefepime 6/6>>>6/8 Levaquin 6/6>>>off Vancomycin 6/6>>>6/8 Levaquin 6/08 >>>6/11  SUBJECTIVE:  No distress  VITAL SIGNS: Temp:  [98.2 F (36.8 C)-99.3 F (37.4 C)] 98.3 F (36.8 C) (06/13 0418) Resp:  [20-25] 24 (06/13 0035) BP: (120-166)/(43-64) 144/64 mmHg (06/13 0418) SpO2:  [93 %-99 %] 95 % (06/13 0035) Weight:  [50.7 kg (111 lb 12.4 oz)] 50.7 kg (111 lb 12.4 oz) (06/13 0418) 3 liters    INTAKE / OUTPUT: Intake/Output     06/12 0701 - 06/13 0700 06/13 0701 - 06/14 0700   I.V. (mL/kg) 60 (1.2)    Other 230    NG/GT     IV Piggyback 150    Total Intake(mL/kg) 440 (8.7)    Urine (mL/kg/hr) 1950 (1.6)    Total Output 1950     Net -1510           PHYSICAL EXAMINATION: General: no distress  HEENT:[place] wnl JVd  Heart: irregular irreg , 2/6 SM to neck Lungs: diffuse coarse unchanged Abdomen: soft, non tender Musculoskeletal: no edema Skin: No rashes or lesions Neuro: awake, alert, writing notes, MAE  LABS: CBC Recent Labs     11/28/13  0435  11/29/13  0420  11/30/13  0500  WBC  8.9  11.0*  10.1  HGB  12.2  13.4  14.1  HCT  37.5  40.2  41.6  PLT  302  353  396   BMET Recent Labs      11/28/13  0435  11/29/13  0420  11/30/13  0500  NA  137  137  132*  K  3.6*  3.2*  4.3  CL  89*  92*  89*  CO2  40*  38*  35*  BUN  26*  17  19  CREATININE  0.43*  0.39*  0.34*  GLUCOSE  101*  95  124*    Electrolytes Recent Labs     11/28/13  0435  11/29/13  0420  11/30/13  0500  CALCIUM  9.0  9.1  8.8  MG  1.8   --    --   PHOS  3.3   --    --    ABG Recent Labs     11/28/13  0645  PHART  7.507*  PCO2ART  50.2*  PO2ART  62.7*   Glucose Recent Labs     11/29/13  0733  11/29/13  1133  11/29/13  1504  11/29/13  1937  11/29/13  2357  11/30/13  0415  GLUCAP  89  118*  114*  157*  124*  146*    Imaging Dg Chest Port 1 View  11/29/2013   CLINICAL DATA:  Respiratory failure  EXAM: PORTABLE CHEST - 1 VIEW  COMPARISON:  11/28/2013  FINDINGS: A left jugular central line is again noted and stable. The endotracheal tube and nasogastric catheter have been removed in the interval. Diffuse interstitial changes are again identified with mild interstitial edema. No focal confluent infiltrate is seen. No acute bony abnormality is noted.  IMPRESSION: Stable interstitial changes bilaterally.   Electronically Signed   By: Inez Catalina M.D.   On: 11/29/2013 07:20  improving aeration     ASSESSMENT / PLAN:  PULMONARY A: Acute hypoxic/hypercapnic respiratory failure 2nd to AECOPD, Parainfluenza PNA, and pulmonary edema. Extubated 6/11, CXR improving  P:   Scheduled brovana/ipratropium/pulmicort Prn albuterol pred 10 bid, dc solumedral F/u CXR intermittently, not required Oxygen to keep SpO2 > 92% mobilize  CARDIOVASCULAR A:  Hypotension from septic shock >> resolved. Chronic systolic/diastolic heart failure. AF w/ RVR Aortic stenosis. P:  Goal negative fluid balancemet , 1.4 lit neg, no sig changes to int pcxr findings dilt oral per cards Continue losartan, ASA Cards following - plan cath Monday Monitor BP closely with lasix and AS ( preload dep) Keep  tele  RENAL A:   Hyponatremia P:   Monitor na in am with neg balance Goal neg remains Chem in am, may need to hold /reduc lasix   GASTROINTESTINAL A:   Nutrition. H/o GI bleed pred related P:   On diet Protonix for SUP, keep while on pred, even with diet  HEMATOLOGIC A:  Mild hemoconcetration P:  Lovenox for DVT prevention F/u CBC intermittently Limit phlebotomy  INFECTIOUS A:   Parainfluenza PNA with ?bacterial superinfection. abx d/c 6/11 P:   Monitor fever curve  ENDOCRINE A:   Steroid induced hyperglycemia. P:   SSI while on steroids  NEUROLOGIC A:   Acute encephalopathy 2nd to respiratory failure >> improved. Deconditioning. P:   Continue klonopin qhs Mobilize  PT Lavon Paganini. Titus Mould, MD, [place] Pgr: [place] [facility]

## 2013-11-30 NOTE — Progress Notes (Signed)
Subjective:  Pleasant and alert and feels good this morning. No chest pain or shortness of breath.  Objective:  Vital Signs in the last 24 hours: BP 144/64  Pulse 72  Temp(Src) 98.3 F (36.8 C) (Oral)  Resp 24  Ht 5\' 1"  (1.549 m)  Wt 50.7 kg (111 lb 12.4 oz)  BMI 21.13 kg/m2  SpO2 95%  Physical Exam: Pleasant, chronically appearing female in no acute distress Lungs:  Clear  Cardiac:  Regular rhythm, normal S1 and S2, no S3, 2/6 systolic murmur left sternal border Abdomen:  Soft, nontender, no masses Extremities:  No edema present  Intake/Output from previous day: 06/12 0701 - 06/13 0700 In: 440 [I.V.:60; IV Piggyback:150] Out: 1950 [Urine:1950] Weight Filed Weights   11/28/13 0457 11/29/13 0400 11/30/13 0418  Weight: 55.4 kg (122 lb 2.2 oz) 52.3 kg (115 lb 4.8 oz) 50.7 kg (111 lb 12.4 oz)    Lab Results: Basic Metabolic Panel:  Recent Labs  11/29/13 0420 11/30/13 0500  NA 137 132*  K 3.2* 4.3  CL 92* 89*  CO2 38* 35*  GLUCOSE 95 124*  BUN 17 19  CREATININE 0.39* 0.34*    CBC:  Recent Labs  11/29/13 0420 11/30/13 0500  WBC 11.0* 10.1  HGB 13.4 14.1  HCT 40.2 41.6  MCV 96.2 95.2  PLT 353 396    BNP    Component Value Date/Time   PROBNP 4145.0* 11/22/2013 1145    PROTIME: Lab Results  Component Value Date   INR 0.98 06/15/2012    Telemetry: Sinus rhythm and sinus tachycardia with PACs and  Assessment/Plan:  1. Aortic stenosis of unclear severity 2. Elevated risk for coronary artery disease 3. 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 Severe COPD  Recommendations:  She appears to be euvolemic right now and catheterization is planned on Monday. Assess aortic stenosis severity at that time.      Kerry Hough  MD [facility]  11/30/2013, 7:49 AM

## 2013-11-30 NOTE — Evaluation (Signed)
Physical Therapy Evaluation Patient Details Name: Jill White MRN: [id number] DOB: 11/24/1940 Today's Date: 11/30/2013   History of Present Illness  PAtient is a 73 y/o female admitted with CAP with h/o COPD with ongoing tobacco use and required intubation 6/6-6/7 and 6/8-6/11.    Clinical Impression  Patient presents with decreased independence with mobility due to deficits listed in PT problem list.  She will benefit from skilled PT in the acute setting to allow return home with 24 hour assist (states daughter/grandaughter can assist) and HHPT.    Follow Up Recommendations Home health PT;Supervision/Assistance - 24 hour    Equipment Recommendations  None recommended by PT    Recommendations for Other Services       Precautions / Restrictions Precautions Precautions: Fall      Mobility  Bed Mobility Overal bed mobility: Needs Assistance Bed Mobility: Supine to Sit     Supine to sit: Min assist        Transfers Overall transfer level: Needs assistance Equipment used: Rolling walker (2 wheeled) Transfers: Sit to/from [facility] Sit to Stand: Min assist Stand pivot transfers: Min assist       General transfer comment: cues for hand placement and assist for safety  Ambulation/Gait Ambulation/Gait assistance: Min guard;Min assist Ambulation Distance (Feet): 12 Feet Assistive device: Rolling walker (2 wheeled) Gait Pattern/deviations: Decreased stride length     General Gait Details: several walks forward and backwards due to on O2 and with HR ranging from 119-147.  Stairs            Wheelchair Mobility    Modified Rankin (Stroke Patients Only)       Balance Overall balance assessment: Needs assistance         Standing balance support: Single extremity supported;Bilateral upper extremity supported Standing balance-Leahy Scale: Poor Standing balance comment: requires UE assist for balance                              Pertinent Vitals/Pain HR range 119-147 during standing activities; no pain complaints    Home Living Family/patient expects to be discharged to:: Private residence Living Arrangements: Alone Available Help at Discharge: Family;Available 24 hours/day Type of Home: House Home Access: Level entry     Home Layout: One level Home Equipment: Walker - 4 wheels Additional Comments: walker from her neighbor but says she can keep    Prior Function Level of Independence: Independent         Comments: drove herself to hosptial from PCP     Hand Dominance        Extremity/Trunk Assessment               Lower Extremity Assessment: Generalized weakness         Communication   Communication: No difficulties  Cognition Arousal/Alertness: Awake/alert Behavior During Therapy: WFL for tasks assessed/performed Overall Cognitive Status: Within Functional Limits for tasks assessed                      General Comments      Exercises        Assessment/Plan    PT Assessment Patient needs continued PT services  PT Diagnosis Generalized weakness   PT Problem List Decreased strength;Decreased activity tolerance;Decreased balance;Cardiopulmonary status limiting activity;Decreased knowledge of use of DME;Decreased mobility;Decreased safety awareness  PT Treatment Interventions DME instruction;Gait training;Functional mobility training;Therapeutic activities;Patient/family education;Balance training;Therapeutic exercise   PT Goals (Current  goals can be found in the Care Plan section) Acute Rehab PT Goals Patient Stated Goal: To go home and return to independent PT Goal Formulation: With patient Time For Goal Achievement: 12/14/13 Potential to Achieve Goals: Good    Frequency Min 3X/week   Barriers to discharge Decreased caregiver support states daughter can stay with her.     Co-evaluation               End of Session Equipment Utilized During  Treatment: Gait belt Activity Tolerance: Treatment limited secondary to medical complications (Comment) (volatile HR) Patient left: in chair;with call bell/phone within reach;with nursing/sitter in room           Time: 1125-1148 PT Time Calculation (min): 23 min   Charges:   PT Evaluation $Initial PT Evaluation Tier I: 1 Procedure PT Treatments $Therapeutic Activity: 8-22 mins   PT G Codes:          WYNN,CYNDI 12/30/2013, 12:09 PM Magda Kiel, [place] Dec 30, 2013

## 2013-11-30 NOTE — Progress Notes (Signed)
Pt is requesting her code status change to full code. Dr. Elsworth Soho notified & aware. Will address to rounding MD in am.

## 2013-12-01 LAB — GLUCOSE, CAPILLARY
GLUCOSE-CAPILLARY: 104 mg/dL — AB (ref 70–99)
GLUCOSE-CAPILLARY: 122 mg/dL — AB (ref 70–99)
Glucose-Capillary: 100 mg/dL — ABNORMAL HIGH (ref 70–99)
Glucose-Capillary: 119 mg/dL — ABNORMAL HIGH (ref 70–99)
Glucose-Capillary: 86 mg/dL (ref 70–99)
Glucose-Capillary: 99 mg/dL (ref 70–99)

## 2013-12-01 LAB — PROTIME-INR
INR: 0.93 (ref 0.00–1.49)
PROTHROMBIN TIME: 12.3 s (ref 11.6–15.2)

## 2013-12-01 LAB — SURGICAL PCR SCREEN
MRSA, PCR: NEGATIVE
STAPHYLOCOCCUS AUREUS: NEGATIVE

## 2013-12-01 MED ORDER — FUROSEMIDE 40 MG PO TABS
40.0000 mg | ORAL_TABLET | Freq: Two times a day (BID) | ORAL | Status: DC
  Administered 2013-12-01: 40 mg via ORAL
  Filled 2013-12-01 (×3): qty 1

## 2013-12-01 MED ORDER — ASPIRIN 81 MG PO CHEW
81.0000 mg | CHEWABLE_TABLET | ORAL | Status: AC
  Administered 2013-12-02: 81 mg via ORAL
  Filled 2013-12-01: qty 1

## 2013-12-01 MED ORDER — SODIUM CHLORIDE 0.9 % IJ SOLN
3.0000 mL | INTRAMUSCULAR | Status: DC | PRN
  Administered 2013-12-01 (×2): via INTRAVENOUS

## 2013-12-01 MED ORDER — SODIUM CHLORIDE 0.9 % IV SOLN
INTRAVENOUS | Status: DC
Start: 2013-12-02 — End: 2013-12-03

## 2013-12-01 MED ORDER — SODIUM CHLORIDE 0.9 % IJ SOLN: 3.0000 mL | Freq: Two times a day (BID) | INTRAMUSCULAR | Status: DC

## 2013-12-01 MED ORDER — SODIUM CHLORIDE 0.9 % IV SOLN: 250.0000 mL | INTRAVENOUS | Status: DC | PRN

## 2013-12-01 NOTE — Progress Notes (Signed)
PULMONARY / CRITICAL CARE MEDICINE   Name: Jill White MRN: [id number] DOB: February 21, 1941    ADMISSION DATE:  11/22/2013 CONSULTATION DATE:  11/23/13  REFERRING MD :  Dr. Arnoldo Morale  CHIEF COMPLAINT:  Short of breath  BRIEF PATIENT DESCRIPTION:  73 yo female smoker admitted with AECOPD.  Intubated 6/06 and PCCM assumed care in ICU.  SIGNIFICANT EVENTS: 6/05 Admit 6/06 VDRF 6/08 Re-intubated 6/10 Cardiology consulted  STUDIES:  Echo 6/06 >> EF 45 to 50%, severe LVH, mod AS, mod/severe LA dilation, PAS 48 mmHg  LINES / TUBES: Left IJ CVC6/6>>> ETT 6/6>>>6/7 L radial a-line 6/6>>>out ETT 6/8>>6/11  CULTURES: Blood cultures 6/6>>> neg  Tracheal aspirate 6/6>>>oral flora Urine culture 6/6>>>negative Respiratory viral panel >> Parainfluenza 3  ANTIBIOTICS: Cefepime 6/6>>>6/8 Levaquin 6/6>>>off Vancomycin 6/6>>>6/8 Levaquin 6/08 >>>6/11  SUBJECTIVE:  No distress in chair, code status changed by cards to full per pt wishes  VITAL SIGNS: Temp:  [97.7 F (36.5 C)-98.7 F (37.1 C)] 98.4 F (36.9 C) (06/14 0500) Pulse Rate:  [75-82] 79 (06/14 0500) Resp:  [15-20] 16 (06/14 0500) BP: (124-145)/(62-65) 145/62 mmHg (06/14 0500) SpO2:  [92 %-98 %] 92 % (06/14 0905) 3 liters    INTAKE / OUTPUT: Intake/Output     06/13 0701 - 06/14 0700 06/14 0701 - 06/15 0700   P.O. 240 240   I.V. (mL/kg)     Other 40    IV Piggyback     Total Intake(mL/kg) 280 (5.5) 240 (4.7)   Urine (mL/kg/hr) 1065 (0.9) 300 (1.8)   Total Output 1065 300   Net -785 -60        Urine Occurrence 1 x    Stool Occurrence 1 x     PHYSICAL EXAMINATION: General: no distress  In chair HEENT:wnl JVd  Heart: irregular irreg , 2/6 SM to neck no change sin this murmur Lungs: diffuse coarse mild Abdomen: soft, non tender Musculoskeletal: no edema Skin: No rashes or lesions Neuro: awake, alert, writing notes, MAE  LABS: CBC Recent Labs     11/29/13  0420  11/30/13  0500  WBC  11.0*  10.1  HGB   13.4  14.1  HCT  40.2  41.6  PLT  353  396   BMET Recent Labs     11/29/13  0420  11/30/13  0500  NA  137  132*  K  3.2*  4.3  CL  92*  89*  CO2  38*  35*  BUN  17  19  CREATININE  0.39*  0.34*  GLUCOSE  95  124*    Electrolytes Recent Labs     11/29/13  0420  11/30/13  0500  CALCIUM  9.1  8.8   ABG No results found for this basename: PHART, PCO2ART, PO2ART,  in the last 72 hours Glucose Recent Labs     11/30/13  1120  11/30/13  1613  11/30/13  2025  12/01/13  0025  12/01/13  0346  12/01/13  0755  GLUCAP  184*  124*  124*  86  99  104*    Imaging No results found.improving aeration     ASSESSMENT / PLAN:  PULMONARY A: Acute hypoxic/hypercapnic respiratory failure 2nd to AECOPD, Parainfluenza PNA, and pulmonary edema. Extubated 6/11, CXR improving  P:   Scheduled brovana/ipratropium/pulmicort Prn albuterol pred 10 bid, consider reduction in am  Oxygen to keep SpO2 > 92% Mobilize, did well in chair  CARDIOVASCULAR A:  Chronic systolic/diastolic heart failure. AF w/ RVR Aortic stenosis.  P:  For dye, consider no lasix dilt oral per cards Continue losartan, ASA Cards following - plan cath Monday, note reviewed, code to full Keep tele  RENAL A:   Hyponatremia P:   Monitor na in am with lasix held Goal even Chem in am  GASTROINTESTINAL A:   Nutrition. H/o GI bleed pred related P:   On diet Protonix for SUP, keep while on pred, even with diet Lower pred in am   HEMATOLOGIC A:  Mild hemoconcetration P:  Lovenox for DVT prevention Limit phlebotomy, no lbs planned in am   INFECTIOUS A:   Parainfluenza PNA with ?bacterial superinfection. abx d/c 6/11 P:   Monitor fever curve  ENDOCRINE A:   Steroid induced hyperglycemia. P:   SSI while on steroids  NEUROLOGIC A:   Acute encephalopathy 2nd to respiratory failure >> improved. Deconditioning. P:   Continue klonopin qhs Mobilize  PT   Lavon Paganini. Titus Mould, MD,  [place] Pgr: [facility]

## 2013-12-01 NOTE — Progress Notes (Signed)
Pt is anxious about going for cardiac cath tom as planned, shes worried if her body is ready for it tom or do it some other time. Advised pt tell the Cardiologist her concerns.

## 2013-12-01 NOTE — Progress Notes (Signed)
Subjective:  No complaints of shortness of breath or chest pain this morning. She had a number of questions about cardiac catheterization also states that she wishes any previous advanced directives revoked and wishes to BE a full code.  Objective:  Vital Signs in the last 24 hours: BP 145/62  Pulse 79  Temp(Src) 98.4 F (36.9 C) (Oral)  Resp 16  Ht 5\' 1"  (1.549 m)  Wt 50.7 kg (111 lb 12.4 oz)  BMI 21.13 kg/m2  SpO2 94%  Physical Exam: Pleasant, chronically appearing female in no acute distress Lungs:  Clear  Cardiac:  Regular rhythm, normal S1 and S2, no S3, 2/6 systolic murmur left sternal border Abdomen:  Soft, nontender, no masses Extremities:  No edema present  Intake/Output from previous day: 06/13 0701 - 06/14 0700 In: 280 [P.O.:240] Out: 1065 [Urine:1065] Weight Filed Weights   11/28/13 0457 11/29/13 0400 11/30/13 0418  Weight: 55.4 kg (122 lb 2.2 oz) 52.3 kg (115 lb 4.8 oz) 50.7 kg (111 lb 12.4 oz)    Lab Results: Basic Metabolic Panel:  Recent Labs  11/29/13 0420 11/30/13 0500  NA 137 132*  K 3.2* 4.3  CL 92* 89*  CO2 38* 35*  GLUCOSE 95 124*  BUN 17 19  CREATININE 0.39* 0.34*    CBC:  Recent Labs  11/29/13 0420 11/30/13 0500  WBC 11.0* 10.1  HGB 13.4 14.1  HCT 40.2 41.6  MCV 96.2 95.2  PLT 353 396    BNP    Component Value Date/Time   PROBNP 4145.0* 11/22/2013 1145    PROTIME: Lab Results  Component Value Date   INR 0.93 12/01/2013   INR 0.98 06/15/2012    Telemetry: Sinus rhythm and sinus tachycardia with PACs and  Assessment/Plan:  1. Aortic stenosis of unclear severity 2. Elevated risk for coronary artery disease 3. 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 Severe COPD  Recommendations:  Change CODE STATUS full at this time.Cardiac catheterization was discussed with the patient fully including risks of myocardial infarction, death, stroke, bleeding, arrhythmia, dye allergy, renal insufficiency or bleeding.  The  patient understands and is willing to proceed. She had questions about the time of catheterization I told her that I felt that her renal status and cardiac process was stable to undergo the cardiac catheterization in the morning.    Kerry Hough  MD [facility]  12/01/2013, 9:01 AM

## 2013-12-02 ENCOUNTER — Encounter (HOSPITAL_COMMUNITY)
Admission: EM | Disposition: A | Discharge status: Home Health | Payer: Self-pay | Source: Home / Self Care | Attending: Pulmonary Disease

## 2013-12-02 DIAGNOSIS — I509 Heart failure, unspecified: Secondary | ICD-10-CM

## 2013-12-02 DIAGNOSIS — I447 Left bundle-branch block, unspecified: Secondary | ICD-10-CM

## 2013-12-02 DIAGNOSIS — I359 Nonrheumatic aortic valve disorder, unspecified: Secondary | ICD-10-CM

## 2013-12-02 DIAGNOSIS — R000 Tachycardia, unspecified: Secondary | ICD-10-CM

## 2013-12-02 HISTORY — PX: LEFT AND RIGHT HEART CATHETERIZATION WITH CORONARY ANGIOGRAM: SHX5449

## 2013-12-02 LAB — POCT I-STAT 3, VENOUS BLOOD GAS (G3P V)
Acid-Base Excess: 4 mmol/L — ABNORMAL HIGH (ref 0.0–2.0)
Bicarbonate: 30.9 mEq/L — ABNORMAL HIGH (ref 20.0–24.0)
O2 SAT: 70 %
TCO2: 32 mmol/L (ref 0–100)
pCO2, Ven: 53.1 mmHg — ABNORMAL HIGH (ref 45.0–50.0)
pH, Ven: 7.372 — ABNORMAL HIGH (ref 7.250–7.300)
pO2, Ven: 38 mmHg (ref 30.0–45.0)

## 2013-12-02 LAB — GLUCOSE, CAPILLARY
GLUCOSE-CAPILLARY: 79 mg/dL (ref 70–99)
Glucose-Capillary: 112 mg/dL — ABNORMAL HIGH (ref 70–99)
Glucose-Capillary: 117 mg/dL — ABNORMAL HIGH (ref 70–99)
Glucose-Capillary: 83 mg/dL (ref 70–99)
Glucose-Capillary: 94 mg/dL (ref 70–99)
Glucose-Capillary: 95 mg/dL (ref 70–99)

## 2013-12-02 LAB — POCT I-STAT 3, ART BLOOD GAS (G3+)
ACID-BASE EXCESS: 10 mmol/L — AB (ref 0.0–2.0)
Bicarbonate: 35.6 mEq/L — ABNORMAL HIGH (ref 20.0–24.0)
O2 Saturation: 95 %
TCO2: 37 mmol/L (ref 0–100)
pCO2 arterial: 52 mmHg — ABNORMAL HIGH (ref 35.0–45.0)
pH, Arterial: 7.444 (ref 7.350–7.450)
pO2, Arterial: 75 mmHg — ABNORMAL LOW (ref 80.0–100.0)

## 2013-12-02 SURGERY — LEFT AND RIGHT HEART CATHETERIZATION WITH CORONARY ANGIOGRAM
Anesthesia: LOCAL

## 2013-12-02 MED ORDER — LIDOCAINE HCL (PF) 1 % IJ SOLN
INTRAMUSCULAR | Status: AC
  Filled 2013-12-02: qty 30

## 2013-12-02 MED ORDER — MIDAZOLAM HCL 2 MG/2ML IJ SOLN
INTRAMUSCULAR | Status: AC
  Filled 2013-12-02: qty 2

## 2013-12-02 MED ORDER — HEPARIN (PORCINE) IN NACL 2-0.9 UNIT/ML-% IJ SOLN
INTRAMUSCULAR | Status: AC
  Filled 2013-12-02: qty 1500

## 2013-12-02 MED ORDER — FENTANYL CITRATE 0.05 MG/ML IJ SOLN
INTRAMUSCULAR | Status: AC
  Filled 2013-12-02: qty 2

## 2013-12-02 MED ORDER — PREDNISONE 10 MG PO TABS
10.0000 mg | ORAL_TABLET | Freq: Every day | ORAL | Status: DC
  Administered 2013-12-03: 10 mg via ORAL
  Filled 2013-12-02 (×2): qty 1

## 2013-12-02 MED ORDER — ATORVASTATIN CALCIUM 40 MG PO TABS
40.0000 mg | ORAL_TABLET | Freq: Every day | ORAL | Status: DC
  Filled 2013-12-02 (×2): qty 1

## 2013-12-02 MED ORDER — ACETAMINOPHEN 325 MG PO TABS: 650.0000 mg | ORAL_TABLET | ORAL | Status: DC | PRN

## 2013-12-02 MED ORDER — SODIUM CHLORIDE 0.9 % IV SOLN: INTRAVENOUS | Status: AC

## 2013-12-02 MED ORDER — NITROGLYCERIN 0.2 MG/ML ON CALL CATH LAB
INTRAVENOUS | Status: AC
Start: 2013-12-02 — End: 2013-12-02
  Filled 2013-12-02: qty 1

## 2013-12-02 NOTE — Progress Notes (Signed)
PULMONARY / CRITICAL CARE MEDICINE   Name: Jill White MRN: [id number] DOB: 23-Jun-1940    ADMISSION DATE:  11/22/2013 CONSULTATION DATE:  11/23/13  REFERRING MD :  Dr. Arnoldo Morale  CHIEF COMPLAINT:  Short of Breath  BRIEF PATIENT DESCRIPTION:  73 yo female smoker admitted with AECOPD.  Intubated 6/06 and PCCM assumed care in ICU. Extubated 6/7 and re-intubated 6/8.  Extubated 6/11.  SIGNIFICANT EVENTS: 6/05 Admit 6/06 VDRF, intubated 6/07 Extubated 6/08 Re-intubated 6/10 Cardiology consulted 6/11 Extubated 6/11 Underwent LHC/RHC >>> mildly calcified coronary arteries w/o obstructive CAD, mild aortic stenosis, est LVEF 60%, mild pulm HTN  STUDIES:  Echo 6/06 >> EF 45 to 50%, severe LVH, mod AS, mod/severe LA dilation, PAS 48 mmHg  LINES / TUBES: Left IJ CVC 6/6>>> out ETT 6/6>>>6/7 L radial a-line 6/6>>> out ETT 6/8>>6/11  CULTURES: Blood cultures 6/6>>> neg  Tracheal aspirate 6/6>>>oral flora Urine culture 6/6>>>negative Respiratory viral panel >> Parainfluenza 3  ANTIBIOTICS: Cefepime 6/6>>>6/8 Levaquin 6/6>>>off Vancomycin 6/6>>>6/8 Levaquin 6/08 >>>6/11  SUBJECTIVE:  Patient is laying comfortably in bed in the cath lab holding area in no acute distress.  She has no complaints at this time. Tolerated LHC/RHC procedure well. Pleasant and cooperative with exam. She is able to ambulate to the bathroom with assistance and O2.  VITAL SIGNS: Temp:  [97.4 F (36.3 C)-98.5 F (36.9 C)] 98.1 F (36.7 C) (06/15 0510) Pulse Rate:  [57-86] 57 (06/15 0510) Resp:  [16] 16 (06/15 0510) BP: (123-140)/(51-61) 123/51 mmHg (06/15 0510) SpO2:  [94 %-100 %] 100 % (06/15 0510) Weight:  [53.3 kg (117 lb 8.1 oz)] 53.3 kg (117 lb 8.1 oz) (06/15 0510) 4L [place]  INTAKE / OUTPUT: Intake/Output     06/14 0701 - 06/15 0700 06/15 0701 - 06/16 0700   P.O. 1200    I.V. (mL/kg) 350 (6.6)    Other     Total Intake(mL/kg) 1550 (29.1)    Urine (mL/kg/hr) 2100 (1.6)    Total Output 2100      Net -550          Urine Occurrence 1 x     PHYSICAL EXAMINATION: General: Elderly white female, laying in bed. NAD. Neuro: Awake, alert, cooperative with exam.  Some generalized weakness but is able to ambulate with assistance.  HEENT: [place]/AT, PERRL. Sclera anicteric.  Oral mucosa pink and moist.  Heart: RRR.  N5A2. 2/6 systolic murmur.  No peripheral edema noted. Lungs: On 4LNC. Breaths even and unlabored. Some mild wheezing on expiration. Abdomen: Soft, non-tender, non-distended.  BS +, no guarding/rebound. Musculoskeletal: No edema, pedal pulses present and equal bilaterally. Skin: Warm, dry, intact.  No rashes or lesions.  LABS:  CBC Recent Labs     11/30/13  0500  WBC  10.1  HGB  14.1  HCT  41.6  PLT  396   BMET Recent Labs     11/30/13  0500  NA  132*  K  4.3  CL  89*  CO2  35*  BUN  19  CREATININE  0.34*  GLUCOSE  124*    Electrolytes Recent Labs     11/30/13  0500  CALCIUM  8.8   ABG No results found for this basename: PHART, PCO2ART, PO2ART,  in the last 72 hours Glucose Recent Labs     12/01/13  1705  12/01/13  2039  12/02/13  0015  12/02/13  0514  12/02/13  0727  12/02/13  0946  GLUCAP  122*  119*  112*  94  79  83    Imaging No results found.improving aeration   ASSESSMENT / PLAN:  PULMONARY A: Acute hypoxic/hypercapnic respiratory failure 2nd to AECOPD, Parainfluenza PNA, and pulmonary edema. Extubated 6/11, CXR improved P:   - Scheduled brovana/ipratropium/pulmicort - PRN albuterol - Pred 10mg  BID, consider reduction to daily now then off in 3 days - Oxygen to keep SpO2 > 92%, goal to wean off oxygen as she is not on home O2 - Continue to ambulate w/ assistance - Ambulation w/ pulse ox monitoring 6/16 am, w/o O2 if sats adaquate at rest  CARDIOVASCULAR A:  Chronic systolic/diastolic heart failure. AF w/ RVR Aortic stenosis. P:  - Dilt oral per cards - Continue losartan, ASA - Cards following - LHC/RHC today, tolerated  well - Keep tele  RENAL A:   Hyponatremia S/p contrast 6/15 P:   - Monitor Na in am with lasix held - Goal even to allow pos - BMet in am post contrast  GASTROINTESTINAL A:   Nutrition. H/o GI bleed pred related P:   - Restart diet post-procedure - Protonix for SUP, keep while on pred, even with diet - Ensure supplements  HEMATOLOGIC A:  Mild hemoconcetration P:  - Lovenox for DVT prevention - dc if ambulation successful, ASA - Limit phlebotomy  INFECTIOUS A:   Parainfluenza PNA with ?bacterial superinfection. abx d/c 6/11 P:   - Monitor fever curve -dc pred over next 4 days  ENDOCRINE A:   Steroid-induced hyperglycemia. P:   - SSI while on steroids -reduce to pred daily  NEUROLOGIC A:   Acute encephalopathy 2nd to respiratory failure >> improved. Deconditioning. P:   - Continue Klonopin QHS - Mobilize, ambulate with assistance  - PT  Stephanie M. Reese, PA-S  I have fully examined this patient and agree with above findings.    And edited in full  Lavon Paganini. Titus Mould, MD, [place] Pgr: [facility]

## 2013-12-02 NOTE — CV Procedure (Signed)
    Cardiac Catheterization Procedure Note  Name: Jill White MRN: [id number] DOB: [birth year]  Procedure: Right Heart Cath, Left Heart Cath, Selective Coronary Angiography, LV angiography  Indication: CHF, aortic stenosis  Procedural Details: The right groin was prepped, draped, and anesthetized with 1% lidocaine. Using the modified Seldinger technique a 4 French sheath was placed in the right femoral artery and a 7 French sheath was placed in the right femoral vein. A Swan-Ganz catheter was used for the right heart catheterization. Standard protocol was followed for recording of right heart pressures and sampling of oxygen saturations. Fick cardiac output was calculated. Standard Judkins catheters were used for selective coronary angiography and left ventriculography. The aortic valve was crossed easily with a J-wire. There were no immediate procedural complications. The patient was transferred to the post catheterization recovery area for further monitoring.  Procedural Findings: Hemodynamics RA 6/4/mean 3 RV 33/4 PA 44/20 mean mean 28 PCWP 15/14 mean 13 LV 160/8 AO 150/55 mean 89  Aortic Valve: peak-peak gradient 10 mmHg, Mean gradient 7 mmHg  Oxygen saturations: PA 70 AO 95  Cardiac Output (Fick) 3.6  Cardiac Index (Fick) 2.78   Coronary angiography: Coronary dominance: right  Left mainstem: The left main is moderately calcified. The vessel is widely patent with no obstructive disease.  Left anterior descending (LAD): The LAD arises from left main. The vessel is large in caliber throughout its course. It wraps around the left ventricular apex. The LAD has mild calcification. The vessel is widely patent with only minimal plaque in its proximal aspect. Otherwise there aren't no stenoses in the vessel is smooth throughout its course  Left circumflex (LCx): The left circumflex is moderately calcified. The vessel supplies 2 large obtuse marginal branches. There is no stenosis  throughout the course of the left circumflex. The mid vessel has minor irregularity. The OM branches are smooth without significant stenoses.  Right coronary artery (RCA): The right coronary artery is mildly calcified. The vessel is dominant. There are minor irregularities with no significant stenoses noted throughout. The PDA and PLA branches are widely patent.  Left ventriculography: The anterolateral wall is mildly hypokinetic. The remaining LV wall segments contract normally. The estimated LVEF is 60%  On plain fluoroscopy, there is mild calcification of the aortic valve. There is diffuse coronary calcification noted. The mitral annulus has very severe calcification noted.   Final Conclusions:   1. Mildly calcified coronary arteries with no obstructive CAD noted 2. Mild aortic stenosis 3. Preserved LV systolic function with estimated LVEF of 60% 4. Mild pulmonary hypertension, otherwise normal intracardiac filling pressures  [name] Mocha 12/02/2013, 9:32 AM

## 2013-12-02 NOTE — Progress Notes (Signed)
    Subjective:  No chest pain. Denies dyspnea.  Objective:  Vital Signs in the last 24 hours: Temp:  [97.4 F (36.3 C)-98.5 F (36.9 C)] 98.1 F (36.7 C) (06/15 0510) Pulse Rate:  [57-86] 57 (06/15 0510) Resp:  [16] 16 (06/15 0510) BP: (123-140)/(51-61) 123/51 mmHg (06/15 0510) SpO2:  [92 %-100 %] 100 % (06/15 0510) Weight:  [117 lb 8.1 oz (53.3 kg)] 117 lb 8.1 oz (53.3 kg) (06/15 0510)  Intake/Output from previous day: 06/14 0701 - 06/15 0700 In: 1200 [P.O.:1200] Out: 1650 [Urine:1650]  Physical Exam: Pt is alert and oriented, NAD HEENT: normal Neck: supple Lungs: diminished breath sounds CV: RRR with 2/6 systolic murmur at LSB Abd: soft, NT/ND Ext: no edema Skin: warm/dry no rash   Lab Results:  Recent Labs  11/30/13 0500  WBC 10.1  HGB 14.1  PLT 396    Recent Labs  11/30/13 0500  NA 132*  K 4.3  CL 89*  CO2 35*  GLUCOSE 124*  BUN 19  CREATININE 0.34*   Cardiac Studies: 2D Echo: Study Conclusions  - Left ventricle: The cavity size was normal. There was severe concentric hypertrophy. Systolic function was mildly reduced. The estimated ejection fraction was in the range of 45% to 50%. - Aortic valve: Inadequate Doppler study. Suspect at least moderate aortic stenosis, possibly severe by 2D valve appearance. Consider TEE. - Mitral valve: Severely calcified annulus. - Left atrium: The atrium was moderately to severely dilated. - Atrial septum: The septum bowed from left to right, consistent with increased left atrial pressure. - Pulmonary arteries: PA peak pressure: 48 mm Hg (S).  Impressions:  - Compared to the study from 2014, LVEF has diminished, there is evidence of hypervolemia and aortic stenosis of unclear severity.  Tele: Sinus rhythm with PVCs  Assessment/Plan:  1. Acute mixed systolic and diastolic CHF, appears euvolemic 2. Intermittent LBBB 3. Wide complex tachycardia suspect A tach with rate related LBBB 4. AS 5.  COPD  Plan continue cardizem and ASA; for R and L cath today to R/O CAD and evaluate AS. Add statin.   Kirk Ruths, M.D. 12/02/2013, 6:21 AM

## 2013-12-02 NOTE — Interval H&P Note (Signed)
History and Physical Interval Note:  12/02/2013 8:32 AM  Jill White  has presented today for surgery, with the diagnosis of cp  The various methods of treatment have been discussed with the patient and family. After consideration of risks, benefits and other options for treatment, the patient has consented to  Procedure(s): LEFT AND RIGHT HEART CATHETERIZATION WITH CORONARY ANGIOGRAM (N/A) as a surgical intervention .  The patient's history has been reviewed, patient examined, no change in status, stable for surgery.  I have reviewed the patient's chart and labs.  Questions were answered to the patient's satisfaction.    Cath Lab Visit (complete for each Cath Lab visit)  Clinical Evaluation Leading to the Procedure:   ACS: no  Non-ACS:    Anginal Classification: CCS II  Anti-ischemic medical therapy: Minimal Therapy (1 class of medications)  Non-Invasive Test Results: No non-invasive testing performed  Prior CABG: No previous CABG       [name] Mocha

## 2013-12-02 NOTE — H&P (View-Only) (Signed)
    Subjective:  No chest pain. Denies dyspnea.  Objective:  Vital Signs in the last 24 hours: Temp:  [97.4 F (36.3 C)-98.5 F (36.9 C)] 98.1 F (36.7 C) (06/15 0510) Pulse Rate:  [57-86] 57 (06/15 0510) Resp:  [16] 16 (06/15 0510) BP: (123-140)/(51-61) 123/51 mmHg (06/15 0510) SpO2:  [92 %-100 %] 100 % (06/15 0510) Weight:  [117 lb 8.1 oz (53.3 kg)] 117 lb 8.1 oz (53.3 kg) (06/15 0510)  Intake/Output from previous day: 06/14 0701 - 06/15 0700 In: 1200 [P.O.:1200] Out: 1650 [Urine:1650]  Physical Exam: Pt is alert and oriented, NAD HEENT: normal Neck: supple Lungs: diminished breath sounds CV: RRR with 2/6 systolic murmur at LSB Abd: soft, NT/ND Ext: no edema Skin: warm/dry no rash   Lab Results:  Recent Labs  11/30/13 0500  WBC 10.1  HGB 14.1  PLT 396    Recent Labs  11/30/13 0500  NA 132*  K 4.3  CL 89*  CO2 35*  GLUCOSE 124*  BUN 19  CREATININE 0.34*   Cardiac Studies: 2D Echo: Study Conclusions  - Left ventricle: The cavity size was normal. There was severe concentric hypertrophy. Systolic function was mildly reduced. The estimated ejection fraction was in the range of 45% to 50%. - Aortic valve: Inadequate Doppler study. Suspect at least moderate aortic stenosis, possibly severe by 2D valve appearance. Consider TEE. - Mitral valve: Severely calcified annulus. - Left atrium: The atrium was moderately to severely dilated. - Atrial septum: The septum bowed from left to right, consistent with increased left atrial pressure. - Pulmonary arteries: PA peak pressure: 48 mm Hg (S).  Impressions:  - Compared to the study from 2014, LVEF has diminished, there is evidence of hypervolemia and aortic stenosis of unclear severity.  Tele: Sinus rhythm with PVCs  Assessment/Plan:  1. Acute mixed systolic and diastolic CHF, appears euvolemic 2. Intermittent LBBB 3. Wide complex tachycardia suspect A tach with rate related LBBB 4. AS 5.  COPD  Plan continue cardizem and ASA; for R and L cath today to R/O CAD and evaluate AS. Add statin.   Kirk Ruths, M.D. 12/02/2013, 6:21 AM

## 2013-12-03 LAB — BASIC METABOLIC PANEL
BUN: 13 mg/dL (ref 6–23)
CO2: 32 mEq/L (ref 19–32)
Calcium: 8.4 mg/dL (ref 8.4–10.5)
Chloride: 93 mEq/L — ABNORMAL LOW (ref 96–112)
Creatinine, Ser: 0.37 mg/dL — ABNORMAL LOW (ref 0.50–1.10)
GFR calc non Af Amer: >90 mL/min (ref >90)
GLUCOSE: 75 mg/dL (ref 70–99)
Potassium: 4 mEq/L (ref 3.7–5.3)
Sodium: 133 mEq/L — ABNORMAL LOW (ref 137–147)

## 2013-12-03 LAB — GLUCOSE, CAPILLARY
GLUCOSE-CAPILLARY: 110 mg/dL — AB (ref 70–99)
GLUCOSE-CAPILLARY: 73 mg/dL (ref 70–99)
Glucose-Capillary: 79 mg/dL (ref 70–99)
Glucose-Capillary: 107 mg/dL — ABNORMAL HIGH (ref 70–99)

## 2013-12-03 MED ORDER — BUDESONIDE-FORMOTEROL FUMARATE 80-4.5 MCG/ACT IN AERO: 2.0000 | INHALATION_SPRAY | Freq: Two times a day (BID) | RESPIRATORY_TRACT | Status: DC

## 2013-12-03 MED ORDER — BUDESONIDE-FORMOTEROL FUMARATE 80-4.5 MCG/ACT IN AERO
2.0000 | INHALATION_SPRAY | Freq: Two times a day (BID) | RESPIRATORY_TRACT | Status: DC
  Filled 2013-12-03: qty 6.9

## 2013-12-03 MED ORDER — TIOTROPIUM BROMIDE MONOHYDRATE 18 MCG IN CAPS: 18.0000 ug | ORAL_CAPSULE | Freq: Every day | RESPIRATORY_TRACT | Status: AC

## 2013-12-03 MED ORDER — PREDNISONE 10 MG PO TABS: 10.0000 mg | ORAL_TABLET | Freq: Every day | ORAL | Status: DC

## 2013-12-03 MED ORDER — TIOTROPIUM BROMIDE MONOHYDRATE 18 MCG IN CAPS
18.0000 ug | ORAL_CAPSULE | Freq: Every day | RESPIRATORY_TRACT | Status: DC
  Filled 2013-12-03: qty 5

## 2013-12-03 MED ORDER — GUAIFENESIN ER 600 MG PO TB12
600.0000 mg | ORAL_TABLET | Freq: Two times a day (BID) | ORAL | Status: DC | PRN
  Filled 2013-12-03: qty 1

## 2013-12-03 MED ORDER — PANTOPRAZOLE SODIUM 40 MG PO TBEC
40.0000 mg | DELAYED_RELEASE_TABLET | Freq: Every day | ORAL | Status: DC
  Administered 2013-12-03: 40 mg via ORAL
  Filled 2013-12-03: qty 1

## 2013-12-03 MED ORDER — DILTIAZEM HCL ER COATED BEADS 120 MG PO CP24: 120.0000 mg | ORAL_CAPSULE | Freq: Every day | ORAL | Status: DC

## 2013-12-03 MED ORDER — ALBUTEROL SULFATE (2.5 MG/3ML) 0.083% IN NEBU: 2.5000 mg | INHALATION_SOLUTION | Freq: Four times a day (QID) | RESPIRATORY_TRACT | Status: DC | PRN

## 2013-12-03 NOTE — Progress Notes (Addendum)
PULMONARY / CRITICAL CARE MEDICINE   Name: Jill White MRN: [id number] DOB: 1940-08-16    ADMISSION DATE:  11/22/2013 CONSULTATION DATE:  11/23/13 LOS 11 days  REFERRING MD :  Dr. Arnoldo Morale  CHIEF COMPLAINT:  Short of Breath  BRIEF PATIENT DESCRIPTION:  73 yo female smoker admitted with AECOPD.  Intubated 6/06 and PCCM assumed care in ICU. Extubated 6/7 and re-intubated 6/8.  Extubated 6/11.  SIGNIFICANT EVENTS: 6/05 Admit 6/06 VDRF, intubated 6/07 Extubated 6/08 Re-intubated 6/10 Cardiology consulted 6/11 Extubated 6/15 Underwent LHC/RHC >>> mildly calcified coronary arteries w/o obstructive CAD, mild aortic stenosis, est LVEF 60%, mild pulm HTN 12/02/13: Patient is laying comfortably in bed in the cath lab holding area in no acute distress.  She has no complaints at this time. Tolerated LHC/RHC procedure well. Pleasant and cooperative with exam. She is able to ambulate to the bathroom with assistance and O2.  STUDIES:  Echo 6/06 >> EF 45 to 50%, severe LVH, mod AS, mod/severe LA dilation, PAS 48 mmHg  LINES / TUBES: Left IJ CVC 6/6>>> out ETT 6/6>>>6/7 L radial a-line 6/6>>> out ETT 6/8>>6/11  CULTURES: Blood cultures 6/6>>> neg  Tracheal aspirate 6/6>>>oral flora Urine culture 6/6>>>negative Respiratory viral panel >> Parainfluenza 3  ANTIBIOTICS: Cefepime 6/6>>>6/8 Levaquin 6/6>>>off Vancomycin 6/6>>>6/8 Levaquin 6/08 >>>6/11  SUBJECTIVE:   12/03/13: Patient feels well enough to go home. Does not want to go  To rehab or SNF. She wants to avoid using o2 24/7. Says she ambulated hallway without o2 and was fine at rest; desaturated only with exertion  VITAL SIGNS: Temp:  [97.9 F (36.6 C)-98.3 F (36.8 C)] 98.3 F (36.8 C) (06/16 0430) Pulse Rate:  [71-79] 71 (06/16 0430) Resp:  [16-20] 20 (06/16 0430) BP: (121-124)/(53-78) 124/53 mmHg (06/16 0430) SpO2:  [94 %-100 %] 97 % (06/16 0430) Weight:  [53.5 kg (117 lb 15.1 oz)] 53.5 kg (117 lb 15.1 oz) (06/16  0430) 4L [place]  INTAKE / OUTPUT: Intake/Output     06/15 0701 - 06/16 0700 06/16 0701 - 06/17 0700   P.O. 240    I.V. (mL/kg) 1535 (28.7)    Total Intake(mL/kg) 1775 (33.2)    Urine (mL/kg/hr) 300 (0.2)    Total Output 300     Net +1475          Urine Occurrence 2 x     PHYSICAL EXAMINATION: General: Elderly white female, sitting and texting with phone. Looks well Neuro: Awake, alert, cooperative with exam.    HEENT: [place]/AT, PERRL. Sclera anicteric.  Oral mucosa pink and moist.  Heart: RRR.  O0A0. 2/6 systolic murmur.  No peripheral edema noted. Lungs: On 4LNC. Breaths even and unlabored. No wheezing on expiration. Abdomen: Soft, non-tender, non-distended.  BS +, no guarding/rebound. Musculoskeletal: No edema, pedal pulses present and equal bilaterally. Skin: Warm, dry, intact.  No rashes or lesions.  LABS:  PULMONARY  Recent Labs Lab 11/28/13 0645 12/02/13 0909  PHART 7.507* 7.444  PCO2ART 50.2* 52.0*  PO2ART 62.7* 75.0*  HCO3 39.1* 30.9*  35.6*  TCO2 34.5 32  37  O2SAT 91.0 70.0  95.0    CBC  Recent Labs Lab 11/28/13 0435 11/29/13 0420 11/30/13 0500  HGB 12.2 13.4 14.1  HCT 37.5 40.2 41.6  WBC 8.9 11.0* 10.1  PLT 302 353 396    COAGULATION  Recent Labs Lab 12/01/13 0620  INR 0.93    CARDIAC  No results found for this basename: TROPONINI,  in the last 168 hours No results found  for this basename: PROBNP,  in the last 168 hours   CHEMISTRY  Recent Labs Lab 11/26/13 2030 11/27/13 0515 11/28/13 0435 11/29/13 0420 11/30/13 0500 12/03/13 0412  NA 135* 135* 137 137 132* 133*  K 3.0* 4.6 3.6* 3.2* 4.3 4.0  CL 90* 93* 89* 92* 89* 93*  CO2 35* 35* 40* 38* 35* 32  GLUCOSE 113* 115* 101* 95 124* 75  BUN 15 18 26* 17 19 13   CREATININE 0.43* 0.44* 0.43* 0.39* 0.34* 0.37*  CALCIUM 8.5 8.6 9.0 9.1 8.8 8.4  MG 1.6 2.3 1.8  --   --   --   PHOS 2.7 2.3 3.3  --   --   --    Estimated Creatinine Clearance: 45.8 ml/min (by C-G formula based on Cr of  0.37).   LIVER  Recent Labs Lab 11/30/13 0500 12/01/13 0620  AST 35  --   ALT 42*  --   ALKPHOS 54  --   BILITOT 0.6  --   PROT 6.0  --   ALBUMIN 2.9*  --   INR  --  0.93     INFECTIOUS No results found for this basename: LATICACIDVEN, PROCALCITON,  in the last 168 hours   ENDOCRINE CBG (last 3)   Recent Labs  12/03/13 0034 12/03/13 0424 12/03/13 0731  GLUCAP 110* 73 79         IMAGING x48h  No results found.    No results found.improving aeration  ASSESSMENT / PLAN:  PULMONARY A: Acute hypoxic/hypercapnic respiratory failure 2nd to AECOPD, Parainfluenza PNA, and pulmonary edema. Extubated 6/11, CXR improved P:   - Scheduled brovana/ipratropium/pulmicort - >change to spiriva/syymbicort (patient insistent on mdi) - PRN albuterol - PRN mucinex (patient insistent) - Home PT (patient finally agreed after long conversation) - Pred 10mg  daily then off in 3 days - Oxygen to keep SpO2 > 92%, goal to wean off oxygen as she is not on home O2 - Continue to ambulate w/ assistance - Ambulation w/ pulse ox monitoring 6/16 am, w/o O2 if sats adaquate at rest (ordered 10:04 AM, she is fussing about being on o2 24/7)   CARDIOVASCULAR A:  Acute mixed systolic and diastolic CHF, appears euvolemic  2. Intermittent LBBB  3. Wide complex tachycardia suspect A tach with rate related LBBB  4. AS - moderate on echo, mild on cath    - neagativ cath  P:  - Dilt oral per cards - Continue losartan, ASA - lipitor - dc tele - dc home from cards standpoint with fu Cards Dr Harrington Challenger  RENAL A:   Hyponatremia S/p contrast 6/15   - mild hyponatremia persists. GFR holding P:   -   GASTROINTESTINAL A:   Nutrition. H/o GI bleed pred related P:   - Restart diet post-procedure - Protonix for SUP, keep while on pred, even with diet - Ensure supplements  HEMATOLOGIC A:  Mild hemoconcetration P:  - Lovenox for DVT prevention - dc if ambulation successful, ASA -  Limit phlebotomy  INFECTIOUS A:   Parainfluenza PNA with ?bacterial superinfection. abx d/c 6/11 P:   - Monitor fever curve -dc pred over next 3 days  ENDOCRINE A:   Steroid-induced hyperglycemia.   - sugars running fine per RN P:   - dc  SSI   NEUROLOGIC A:   Acute encephalopathy 2nd to respiratory failure >> improved. Deconditioning.   - normal mental status. Looks improved conditioning  P:   - Continue Klonopin QHS - Mobilize, ambulate with  assistance  - PT - home with home PT (need to offer this to her)   GLOBAL Dc 12/03/13 with home PT and opd pulm followup - Dr Chase Caller or others. Will need pft, pulm rehab set up at followup    > 30 min dc planning  Dr. Brand Males, M.D., [facility].C.P Pulmonary and Critical Care Medicine Staff Physician [place] [facility] Pager: [phone], If no answer or between  15:00h - 7:00h: call [phone]  12/03/2013 10:07 AM

## 2013-12-03 NOTE — Progress Notes (Signed)
PT/OT/ST Cancellation Note  ___Treatment cancelled today due to medical issues with patient which prohibited therapy  ___ Treatment cancelled today due to patient receiving procedure or test   ___ Treatment cancelled today due to patient's refusal to participate   _X_ Arrived to room after lunch pt resting comfortably.  Pt politely declined to amb at this time stating "they walked me earlier and I am going home at five".  Per PT eval pt plans to return home with daughter and [place].  HH PT rec.  No equipment needed.   Rica Koyanagi  PTA [facility]  Acute  Rehab Pager      [phone]

## 2013-12-03 NOTE — Discharge Summary (Signed)
Physician Discharge Summary       Patient ID: Jill White MRN: [id number] DOB/AGE: 1941/01/10 73 y.o.  Admit date: 11/22/2013 Discharge date: 12/03/2013  Discharge Diagnoses:   Acute hypoxic/hypercapnic respiratory failure  AECOPD Parainfluenza PNA pulmonary edema.  Chronic systolic/diastolic heart failure.  Atrial fibrillation  w/ Rapid ventricular response  Aortic stenosis Hyponatremia  S/p contrast 6/15  H/o GI bleed pred related  Mild hemoconcetration  Parainfluenza PNA with ?bacterial superinfection.  Steroid-induced hyperglycemia.  Acute encephalopathy 2nd to respiratory failure >> improved.  Deconditioning.   Detailed Hospital Course:   73 years old female with PMH relevant for HTN and COPD. Admitted 6/5 under [facility] care with respiratory distress and hypoxemia to the 70's. Started on Levofloxacin And bronchodilators and Initially more stable but now progressed to severe hypercarbic and hypoxemic respiratory failure. Started on BiPAP but ended up requiring intubation and mechanical ventilation. Chest X ray with worsening infiltrates. She has an elevated WBC and today spiked a fever up to 102. After intubation and sedation with propofol developed hypotension and bradycardia. Propofol was discontinued and required norepinephrine and dopamine.   Culture data was sent, therapeutic interventions included: mechanical ventilation, empiric antibiotics, scheduled bronchodilators and systemic steroids. Her course was complicated by: acute on chronic decompensated wide complex tachycardia, left bundle branch block (age undetermined). Her viral panel came back as parainfluenza positive. In addition to above interventions we also provided IV diuresis. She was extubated on 6/11. Her oxygen was weaned. Her antibiotics were discontinued. An echo was obtained that suggested moderate AS and decreased LV function and because of this it was decided she should have right and left heart  cath. This was completed on 6/15 and showed mild aortic stenosis, est LVEF 60%, mild pulm HTN, non-obstructive CAD. She is now stable for d/c as of 6/16 with plan of care as outlined below.    Discharge Plan by active diagnoses  Parainfluenza PNA AECOPD Plan:   Home on symbicort and Spiriva w rescue SABA  Home on 4 liters O2 w activity and sleep   PRN mucinex    Prednisone 10mg  daily x 3 days and d/c  Chronic systolic/diastolic heart failure.  Non-obstructive CAD AF w/ RVR  Aortic stenosis.  Pulmonary edema  Plan:   Home on Cardizem,  Hyzaar and asa  F/u with Dr Harrington Challenger: aug 21 at 10 am     H/o GI bleed pred related  Plan:   Cont protonix while on steroids  Deconditioning.  Plan:  - Continue Klonopin QHS  - Mobilize, ambulate with assistance  - [facility] tests/ studies/ interventions and procedures  Consults: cardiology   SIGNIFICANT EVENTS:  6/05 Admit  6/06 VDRF, intubated  6/07 Extubated  6/08 Re-intubated  6/10 Cardiology consulted  6/11 Extubated  6/15 Underwent LHC/RHC >>> mildly calcified coronary arteries w/o obstructive CAD, mild aortic stenosis, est LVEF 60%, mild pulm HTN  12/02/13: Patient is laying comfortably in bed in the cath lab holding area in no acute distress. She has no complaints at this time. Tolerated LHC/RHC procedure well. Pleasant and cooperative with exam. She is able to ambulate to the bathroom with assistance and O2.   STUDIES:  Echo 6/06 >> EF 45 to 50%, severe LVH, mod AS, mod/severe LA dilation, PAS 48 mmHg   LINES / TUBES:  Left IJ CVC 6/6>>> out  ETT 6/6>>>6/7  L radial a-line 6/6>>> out  ETT 6/8>>6/11   CULTURES:  Blood cultures 6/6>>> neg  Tracheal aspirate  6/6>>>oral flora  Urine culture 6/6>>>negative  Respiratory viral panel >> Parainfluenza 3   ANTIBIOTICS:  Cefepime 6/6>>>6/8  Levaquin 6/6>>>off  Vancomycin 6/6>>>6/8  Levaquin 6/08 >>>6/11    Discharge Exam: BP 130/71  Pulse 82  Temp(Src) 97.7  F (36.5 C) (Oral)  Resp 18  Ht 5\' 1"  (1.549 m)  Wt 53.5 kg (117 lb 15.1 oz)  BMI 22.30 kg/m2  SpO2 94%  General: Elderly white female, sitting and texting with phone. Looks well  Neuro: Awake, alert, cooperative with exam.  HEENT: [place]/AT, PERRL. Sclera anicteric. Oral mucosa pink and moist.  Heart: RRR. P3X9. 2/6 systolic murmur. No peripheral edema noted.  Lungs: On 4LNC. Breaths even and unlabored. No wheezing on expiration.  Abdomen: Soft, non-tender, non-distended. BS +, no guarding/rebound.  Musculoskeletal: No edema, pedal pulses present and equal bilaterally.  Skin: Warm, dry, intact. No rashes or lesions   Labs at discharge Lab Results  Component Value Date   CREATININE 0.37* 12/03/2013   BUN 13 12/03/2013   NA 133* 12/03/2013   K 4.0 12/03/2013   CL 93* 12/03/2013   CO2 32 12/03/2013   Lab Results  Component Value Date   WBC 10.1 11/30/2013   HGB 14.1 11/30/2013   HCT 41.6 11/30/2013   MCV 95.2 11/30/2013   PLT 396 11/30/2013   Lab Results  Component Value Date   ALT 42* 11/30/2013   AST 35 11/30/2013   ALKPHOS 54 11/30/2013   BILITOT 0.6 11/30/2013   Lab Results  Component Value Date   INR 0.93 12/01/2013   INR 0.98 06/15/2012    Current radiology studies No results found.  Disposition:  01-Home or Self Care      Discharge Instructions   Diet - low sodium heart healthy    Complete by:  As directed      Increase activity slowly    Complete by:  As directed             Medication List    STOP taking these medications       furosemide 20 MG tablet  Commonly known as:  LASIX      TAKE these medications       albuterol 108 (90 BASE) MCG/ACT inhaler  Commonly known as:  PROVENTIL HFA;VENTOLIN HFA  Inhale 2 puffs into the lungs every 4 (four) hours as needed for wheezing or shortness of breath.     aspirin 81 MG tablet  Take 81 mg by mouth every morning.     budesonide-formoterol 80-4.5 MCG/ACT inhaler  Commonly known as:  SYMBICORT  Inhale 2  puffs into the lungs 2 (two) times daily.     clonazePAM 0.5 MG tablet  Commonly known as:  KLONOPIN  Take 1 mg by mouth at bedtime.     dextromethorphan-guaiFENesin 30-600 MG per 12 hr tablet  Commonly known as:  MUCINEX DM  Take 1 tablet by mouth 2 (two) times daily.     diltiazem 120 MG 24 hr capsule  Commonly known as:  CARDIZEM CD  Take 1 capsule (120 mg total) by mouth daily.     losartan-hydrochlorothiazide 50-12.5 MG per tablet  Commonly known as:  HYZAAR  Take 1 tablet by mouth every morning.     ondansetron 4 MG tablet  Commonly known as:  ZOFRAN  Take 4 mg by mouth every 4 (four) hours as needed for nausea or vomiting.     potassium chloride 10 MEQ tablet  Commonly known as:  K-DUR  Take  10 mEq by mouth 2 (two) times daily.     predniSONE 10 MG tablet  Commonly known as:  DELTASONE  Take 1 tablet (10 mg total) by mouth daily with breakfast.     tiotropium 18 MCG inhalation capsule  Commonly known as:  SPIRIVA  Place 1 capsule (18 mcg total) into inhaler and inhale daily.     vitamin E 100 UNIT capsule  Take 100 Units by mouth every morning.       Follow-up Information   Follow up with [facility], MD On 01/24/2014. (230pm)    Specialty:  Pulmonary Disease   Contact information:   [place] [address] [phone]       Follow up with PARRETT,TAMMY, NP On 12/10/2013. (3pm )    Specialty:  Nurse Practitioner   Contact information:   [place] [address] [phone]       Follow up with Dorris Carnes, MD.   Specialty:  Cardiology   Contact information:   [address] [place] [address] [phone]       Discharged Condition: good  Physician Statement:   The Patient was personally examined, the discharge assessment and plan has been personally reviewed and I agree with ACNP Babcock's assessment and plan. > 30 minutes of time have been dedicated to discharge assessment, planning and  discharge instructions.   Signed: BABCOCK,PETE 12/03/2013, 2:41 PM

## 2013-12-03 NOTE — Progress Notes (Signed)
SATURATION QUALIFICATIONS: (This note is used to comply with regulatory documentation for home oxygen)  Patient Saturations on Room Air at Rest = 92%  Patient Saturations on Room Air while Ambulating = 85%  Patient Saturations on 4 Liters of oxygen while Ambulating = 92%  Please briefly explain why patient needs home oxygen:

## 2013-12-03 NOTE — Discharge Instructions (Signed)
Wear your oxygen with activity and sleep. You may leave it off at rest.

## 2013-12-03 NOTE — Progress Notes (Signed)
    Subjective:  No chest pain. Denies dyspnea.  Objective:  Vital Signs in the last 24 hours: Temp:  [97.9 F (36.6 C)-98.3 F (36.8 C)] 98.3 F (36.8 C) (06/16 0430) Pulse Rate:  [71-79] 71 (06/16 0430) Resp:  [16-20] 20 (06/16 0430) BP: (121-124)/(53-78) 124/53 mmHg (06/16 0430) SpO2:  [94 %-100 %] 97 % (06/16 0430) Weight:  [117 lb 15.1 oz (53.5 kg)] 117 lb 15.1 oz (53.5 kg) (06/16 0430)  Intake/Output from previous day: 06/15 0701 - 06/16 0700 In: 4599 [P.O.:240; I.V.:1135] Out: 300 [Urine:300]  Physical Exam: Pt is alert and oriented, NAD HEENT: normal Neck: supple Lungs: diminished breath sounds CV: RRR with 2/6 systolic murmur at LSB Abd: soft, NT/ND Ext: no edema Skin: warm/dry no rash    Recent Labs  12/03/13 0412  NA 133*  K 4.0  CL 93*  CO2 32  GLUCOSE 75  BUN 13  CREATININE 0.37*   Cardiac Studies: 2D Echo: Study Conclusions  - Left ventricle: The cavity size was normal. There was severe concentric hypertrophy. Systolic function was mildly reduced. The estimated ejection fraction was in the range of 45% to 50%. - Aortic valve: Inadequate Doppler study. Suspect at least moderate aortic stenosis, possibly severe by 2D valve appearance. Consider TEE. - Mitral valve: Severely calcified annulus. - Left atrium: The atrium was moderately to severely dilated. - Atrial septum: The septum bowed from left to right, consistent with increased left atrial pressure. - Pulmonary arteries: PA peak pressure: 48 mm Hg (S).  Impressions:  - Compared to the study from 2014, LVEF has diminished, there is evidence of hypervolemia and aortic stenosis of unclear severity.  Tele: Sinus rhythm with PVCs and brief WCT (possible aberrancy).   Assessment/Plan:  1. Acute mixed systolic and diastolic CHF, appears euvolemic 2. Intermittent LBBB 3. Wide complex tachycardia suspect A tach with rate related LBBB 4. AS 5. COPD  Plan continue cardizem and ASA; cath  reveals no obstructive CAD and mild AS; patient can be DCed from a cardiac standpoint and fu with Dr Harrington Challenger. Please call with questions.   Kirk Ruths, M.D. 12/03/2013, 6:46 AM

## 2013-12-03 NOTE — Progress Notes (Signed)
CARE MANAGEMENT NOTE 12/03/2013  Patient:  APRYL, Jill White   Account Number:  [id number]  Date Initiated:  11/25/2013  Documentation initiated by:  DAVIS,RHONDA  Subjective/Objective Assessment:   pt with hx of copd admitted for excerbation intubated until [id number], fi02 at 40%     Action/Plan:   pt does live alone may need hhc or placement   Anticipated DC Date:  12/01/2013   Anticipated DC Plan:  [place]  In-house referral  NA      DC Planning Services  CM consult      [facility] Choice  HOME HEALTH   Choice offered to / List presented to:  C-4 Adult Children   DME arranged  [facility]      DME agency  [place] arranged  [place].   Status of service:  In process, will continue to follow Medicare Important Message given?  NA - LOS <3 / Initial given by admissions (If response is "NO", the following Medicare IM given date fields will be blank) Date Medicare IM given:  11/27/2013 Date Additional Medicare IM given:    Discharge Disposition:  [place]  Per UR Regulation:  Reviewed for med. necessity/level of care/duration of stay  If discussed at [place] of Stay Meetings, dates discussed:   11/28/2013    Comments:  12/03/13 MMcGibboney, RN, BSN Pt's daughter Janett Billow called for pt's home O2. List of [place] given to her over the telephone. [name] selected [place], referral called to in house rep.  [phone] Rosana Hoes, RN, [place], [place] [phone] Chart Reviewed for discharge and hospital needs. Discharge needs at time of review: None present will follow for needs. patient extubated -one way made dni, and partial code will foolow to see progression. Review of patient progress due on [id number].   [id number]/TIRWER Rosana Hoes RN, BSN, [place] [phone] Chart Reviewed for discharge and hospital needs. Discharge needs at time of review: None present will follow  for needs. Review of patient progress due on [id number].

## 2013-12-04 NOTE — Discharge Summary (Signed)
See my progress note for details. > 30 min dc planning. Agree with NP dc plan  Dr. Brand Males, M.D., [facility].C.P Pulmonary and Critical Care Medicine Staff Physician [place] [facility] Pager: [phone], If no answer or between  15:00h - 7:00h: call [phone]  12/04/2013 8:29 AM

## 2013-12-10 ENCOUNTER — Encounter: Payer: Self-pay | Admitting: Adult Health

## 2013-12-10 ENCOUNTER — Ambulatory Visit (INDEPENDENT_AMBULATORY_CARE_PROVIDER_SITE_OTHER): Payer: Medicare Other | Admitting: Adult Health

## 2013-12-10 ENCOUNTER — Ambulatory Visit (INDEPENDENT_AMBULATORY_CARE_PROVIDER_SITE_OTHER)
Admission: RE | Admit: 2013-12-10 | Discharge: 2013-12-10 | Disposition: A | Discharge status: Home / Self Care | Payer: Medicare Other | Source: Ambulatory Visit | Attending: Adult Health | Admitting: Adult Health

## 2013-12-10 VITALS — BP 114/68 | HR 90 | Temp 98.0°F | Ht 61.0 in | Wt 116.0 lb

## 2013-12-10 DIAGNOSIS — J189 Pneumonia, unspecified organism: Secondary | ICD-10-CM

## 2013-12-10 DIAGNOSIS — J449 Chronic obstructive pulmonary disease, unspecified: Secondary | ICD-10-CM

## 2013-12-10 NOTE — Patient Instructions (Signed)
Continue on Symbicort and Spiriva Great job on not smoking Use oxygen 2 L with walking and at bedtime. Followup with Dr. Chase Caller in 4-6 weeks with a pulmonary function test  Please contact office for sooner follow up if symptoms do not improve or worsen or seek emergency care

## 2013-12-10 NOTE — Progress Notes (Signed)
   Subjective:    Patient ID: Jill White, female    DOB: 01-14-1941, 73 y.o.   MRN: [id number]  HPI  12/10/2013 [facility] follow up  Patient returns for a post hospital followup. Patient was admitted June 5 through June 16, for acute exacerbation of COPD, complicated by acute hypoxic and hypercarbic respiratory failure, and. Influenza, pneumonia. Her hospital stay was complicated by decompensated diastolic heart failure and atrial fibrillation with RVR. She required  Intubation w/ vent support.  . She underwent a left, and right heart catheter showed a mild calcified coronary arteries without obstructive coronary disease. Mild aortic stenosis. An EF of 60% with mild pulmonary hypertension. He was treated with aggressive IV antibiotics, steroids, and nebulized bronchodilators. Discharged on O2 with act and At bedtime  .  Since discharge. She is feeling so much better.  She denies any hemoptysis, chest pain, orthopnea, PND, or increased leg swelling.  Prior to admission was not on COPD tx and was not on O2.     Review of Systems Constitutional:   No  weight loss, night sweats,  Fevers, chills, + fatigue, or  lassitude.  HEENT:   No headaches,  Difficulty swallowing,  Tooth/dental problems, or  Sore throat,                No sneezing, itching, ear ache, nasal congestion, post nasal drip,   CV:  No chest pain,  Orthopnea, PND, swelling in lower extremities, anasarca, dizziness, palpitations, syncope.   GI  No heartburn, indigestion, abdominal pain, nausea, vomiting, diarrhea, change in bowel habits, loss of appetite, bloody stools.   Resp:    No chest wall deformity  Skin: no rash or lesions.  GU: no dysuria, change in color of urine, no urgency or frequency.  No flank pain, no hematuria   MS:  No joint pain or swelling.  No decreased range of motion.  No back pain.  Psych:  No change in mood or affect. No depression or anxiety.  No memory loss.         Objective:   Physical Exam GEN: A/Ox3; pleasant , NAD   HEENT:  [place]/AT,  EACs-clear, TMs-wnl, NOSE-clear, THROAT-clear, no lesions, no postnasal drip or exudate noted.   NECK:  Supple w/ fair ROM; no JVD; normal carotid impulses w/o bruits; no thyromegaly or nodules palpated; no lymphadenopathy.  RESP  Diminished BS in bases no accessory muscle use, no dullness to percussion  CARD:  RRR, SM 1/6   , tr peripheral edema, pulses intact, no cyanosis or clubbing.  GI:   Soft & nt; nml bowel sounds; no organomegaly or masses detected.  Musco: Warm bil, no deformities or joint swelling noted.   Neuro: alert, no focal deficits noted.    Skin: Warm, no lesions or rashes         Assessment & Plan:

## 2013-12-11 DIAGNOSIS — R6521 Severe sepsis with septic shock: Secondary | ICD-10-CM

## 2013-12-11 DIAGNOSIS — A419 Sepsis, unspecified organism: Secondary | ICD-10-CM | POA: Diagnosis present

## 2013-12-11 HISTORY — DX: Sepsis, unspecified organism: A41.9

## 2013-12-11 HISTORY — DX: Severe sepsis with septic shock: R65.21

## 2013-12-17 NOTE — Assessment & Plan Note (Signed)
Resolved on cxr  Cont on current regimen

## 2013-12-17 NOTE — Assessment & Plan Note (Signed)
Recent flare -needs PFT   Plan  Continue on Symbicort and Spiriva Great job on not smoking Use oxygen 2 L with walking and at bedtime. Followup with Dr. Chase Caller in 4-6 weeks with a pulmonary function test  Please contact office for sooner follow up if symptoms do not improve or worsen or seek emergency care

## 2013-12-25 ENCOUNTER — Encounter: Payer: Self-pay | Admitting: Internal Medicine

## 2013-12-25 ENCOUNTER — Ambulatory Visit (INDEPENDENT_AMBULATORY_CARE_PROVIDER_SITE_OTHER): Payer: Medicare Other | Admitting: Internal Medicine

## 2013-12-25 ENCOUNTER — Telehealth: Payer: Self-pay | Admitting: Internal Medicine

## 2013-12-25 VITALS — BP 114/68 | HR 125 | Ht 61.0 in | Wt 115.8 lb

## 2013-12-25 DIAGNOSIS — Z720 Tobacco use: Secondary | ICD-10-CM

## 2013-12-25 DIAGNOSIS — I5043 Acute on chronic combined systolic (congestive) and diastolic (congestive) heart failure: Secondary | ICD-10-CM

## 2013-12-25 DIAGNOSIS — J441 Chronic obstructive pulmonary disease with (acute) exacerbation: Secondary | ICD-10-CM

## 2013-12-25 DIAGNOSIS — F17200 Nicotine dependence, unspecified, uncomplicated: Secondary | ICD-10-CM

## 2013-12-25 DIAGNOSIS — I509 Heart failure, unspecified: Secondary | ICD-10-CM

## 2013-12-25 MED ORDER — DOXYCYCLINE HYCLATE 100 MG PO TABS: ORAL_TABLET | ORAL | Status: DC

## 2013-12-25 NOTE — Assessment & Plan Note (Signed)
Counselled °

## 2013-12-25 NOTE — Telephone Encounter (Signed)
Called spoke with pt. appt scheduled for her to come in at 9:15 this AM to see CDY. Nothing further needed

## 2013-12-25 NOTE — Assessment & Plan Note (Signed)
Mostly severe emphysema clinically. Now with an acute bronchitic exacerbation from unspecified organism. Plan- Doxycycline, suportive measures.

## 2013-12-25 NOTE — Patient Instructions (Signed)
Script for doxycycline antibiotic sent   Please try very hard not to smoke at all. This is a good time to swear off the cigarettes forever.  Please keep the appointment as scheduled with Dr Chase Caller. You can call us if needed.

## 2013-12-25 NOTE — Assessment & Plan Note (Signed)
Recognize significant background heart disease contributing to dyspnea. Managed by cardiology

## 2013-12-25 NOTE — Progress Notes (Signed)
Subjective:    Patient ID: Jill White, female    DOB: [birth year], 73 y.o.   MRN: [id number]  HPI  12/10/2013 [facility] follow up  Patient returns for a post hospital followup. Patient was admitted June 5 through June 16, for acute exacerbation of COPD, complicated by acute hypoxic and hypercarbic respiratory failure, and. Influenza, pneumonia. Her hospital stay was complicated by decompensated diastolic heart failure and atrial fibrillation with RVR. She required  Intubation w/ vent support.  . She underwent a left, and right heart catheter showed a mild calcified coronary arteries without obstructive coronary disease. Mild aortic stenosis. An EF of 60% with mild pulmonary hypertension. He was treated with aggressive IV antibiotics, steroids, and nebulized bronchodilators. Discharged on O2 with act and At bedtime  .  Since discharge. She is feeling so much better.  She denies any hemoptysis, chest pain, orthopnea, PND, or increased leg swelling.  Prior to admission was not on COPD tx and was not on O2.   12/25/13- 65 yoF smoker with COPD, CHF, other problems as above. ACUTE VISIT: saw TP post hosp  on 12-10-13; will be MR patient in 01-2014. Having increased congestion with cough since last visit. Using Symbicort 80, rescue inhaler I'm Panicking". Feels increased light congestion in head and chest, scant yellow. No pain, fever, nodes.  She says she is a "germophobe" and very afraid that she might be starting same pattern that led to hosp/ vent for acute respiratory failure. CXR 12/10/13 FINDINGS:  The lungs are hyperaerated with flattened hemidiaphragms consistent  with emphysema. No focal infiltrate or effusion is seen. The heart  is mildly enlarged and there appears be heavy calcification of the  mitral annulus. There are degenerative changes throughout the  thoracic spine.  IMPRESSION:  No active lung disease. Hyper aeration consistent with emphysema  Electronically Signed  By:  Ivar Drape M.D.  On: 12/10/2013 15:03   ROS-see HPI Constitutional:   No-   weight loss, night sweats, fevers, chills, fatigue, lassitude. HEENT:   No-  headaches, difficulty swallowing, tooth/dental problems, sore throat,       No-  sneezing, itching, ear ache, nasal congestion, post nasal drip,  CV:  No-   chest pain, orthopnea, PND, swelling in lower extremities, anasarca,                                  dizziness, palpitations Resp: +shortness of breath with exertion or at rest.              + productive cough,  No non-productive cough,  No- coughing up of blood.              +change in color of mucus.  No- wheezing.   Skin: No-   rash or lesions. GI:  No-   heartburn, indigestion, abdominal pain, nausea, vomiting, GU:  MS:  No-   joint pain or swelling.   Neuro-     nothing unusual Psych:  No- change in mood or affect. No depression + anxiety.  No memory loss.    Objective:  OBJ- Physical Exam General- Alert, Oriented, Affect-appropriate, Distress- anxious. Thin Skin- rash-none, lesions- none, excoriation- none Lymphadenopathy- none Head- atraumatic            Eyes- Gross vision intact, PERRLA, conjunctivae and secretions clear            Ears- Hearing, canals-normal  Nose- Clear, no-Septal dev, mucus, polyps, erosion, perforation             Throat- Mallampati II , mucosa clear , drainage- none, tonsils- atrophic Neck- flexible , trachea midline, no stridor , thyroid nl, carotid no bruit Chest - symmetrical excursion , unlabored           Heart/CV- RRR , no murmur , no gallop  , no rub, nl s1 s2                           - JVD- none , edema- none, stasis changes- none, varices- none           Lung-  +distant/unlabored, wheeze- none, cough- none , dullness-none, rub- none           Chest wall-  Abd-  Br/ Gen/ Rectal- Not done, not indicated Extrem- cyanosis- none, clubbing, none, atrophy- none, strength- nl Neuro- grossly intact to observation    Assessment &  Plan:

## 2013-12-27 ENCOUNTER — Telehealth: Payer: Self-pay | Admitting: Internal Medicine

## 2013-12-27 NOTE — Telephone Encounter (Signed)
Jill White w/ [facility] calling, pt is no longer homebound (driving and going on vacations) so [facility] discharged her for PT. Per Jill White, pt is aware of the above and this is an FYI only for MR.  No call back needed.

## 2013-12-31 ENCOUNTER — Other Ambulatory Visit: Payer: Self-pay | Admitting: Family Medicine

## 2014-01-13 ENCOUNTER — Other Ambulatory Visit: Payer: Self-pay | Admitting: Family Medicine

## 2014-01-13 NOTE — Telephone Encounter (Signed)
Call in #[phone] with 5 rf 

## 2014-01-24 ENCOUNTER — Ambulatory Visit (HOSPITAL_COMMUNITY)
Admission: RE | Admit: 2014-01-24 | Discharge: 2014-01-24 | Disposition: A | Discharge status: Home / Self Care | Payer: Medicare Other | Source: Ambulatory Visit | Attending: Internal Medicine | Admitting: Internal Medicine

## 2014-01-24 ENCOUNTER — Encounter: Payer: Self-pay | Admitting: Internal Medicine

## 2014-01-24 ENCOUNTER — Ambulatory Visit (INDEPENDENT_AMBULATORY_CARE_PROVIDER_SITE_OTHER): Payer: Medicare Other | Admitting: Internal Medicine

## 2014-01-24 VITALS — BP 142/60 | HR 124 | Ht 62.0 in | Wt 118.0 lb

## 2014-01-24 DIAGNOSIS — J4489 Other specified chronic obstructive pulmonary disease: Secondary | ICD-10-CM | POA: Insufficient documentation

## 2014-01-24 DIAGNOSIS — J449 Chronic obstructive pulmonary disease, unspecified: Secondary | ICD-10-CM

## 2014-01-24 DIAGNOSIS — R0609 Other forms of dyspnea: Secondary | ICD-10-CM | POA: Diagnosis not present

## 2014-01-24 DIAGNOSIS — R0989 Other specified symptoms and signs involving the circulatory and respiratory systems: Secondary | ICD-10-CM | POA: Diagnosis not present

## 2014-01-24 DIAGNOSIS — F17200 Nicotine dependence, unspecified, uncomplicated: Secondary | ICD-10-CM | POA: Insufficient documentation

## 2014-01-24 DIAGNOSIS — J988 Other specified respiratory disorders: Secondary | ICD-10-CM | POA: Diagnosis not present

## 2014-01-24 MED ORDER — ALBUTEROL SULFATE (2.5 MG/3ML) 0.083% IN NEBU
2.5000 mg | INHALATION_SOLUTION | Freq: Once | RESPIRATORY_TRACT | Status: AC
  Administered 2014-01-24: 2.5 mg via RESPIRATORY_TRACT

## 2014-01-24 NOTE — Progress Notes (Signed)
Subjective:    Patient ID: Jill White, female    DOB: May 04, 1941, 73 y.o.   MRN: [id number]  HPI    OV 01/24/2014  Chief Complaint  Patient presents with  . Follow-up    Pt with PFT results. Pt saw CY for an acute visit. Pt states her breathing has improved since last visit with CY. Pt c/o prod cough white and yellow mucous, sinus pressure and PND. Pt denies SOB and Cp/tightness.    Followup COPD.  - She was hospitalized in mid June 2015 with acute exacerbation of COPD and acute respiratory failure due to parainfluenza virus 3. She now presents for followup. In the interim she is seen 2 of my colleagues. Condition is stable help with both Spiriva and Symbicort. Pulmonary function test reviewed below shows Gold stage II COPD. She is quite functional. Her main issue is that the inhalers, some hoarseness of voice but there is no thrush. She sings in church but feels she will accept the mild hoarseness. She's also asking questions to understand  COPD better. Overall she is class II d dyspnea with  mild cough with rare sputum production. Discussed COPD research trials and she is interested or at least wants to think about it    Pulmonary function test today 01/24/2014: Postbronchodilator FVC 2.1 L/82%. FEV1 1.2 L or 61%. Ratio 56. Total lung capacity of 5.5/114% predicted. Diffusion of 8.1/37% predicted. No bronchodilator response. Overall GOLD stage II COPD  Imaging  June 2015 CT - bibasal pneumonia June 2015 CXR  - clear  Tobacco  reports that she has been smoking Cigarettes.  She has a 50 pack-year smoking history. She has never used smokeless tobacco.   Review of Systems  Constitutional: Negative for fever and unexpected weight change.  HENT: Positive for congestion and postnasal drip. Negative for dental problem, ear pain, nosebleeds, rhinorrhea, sinus pressure, sneezing, sore throat and trouble swallowing.   Eyes: Negative for redness and itching.  Respiratory: Positive for  cough. Negative for chest tightness, shortness of breath and wheezing.   Cardiovascular: Negative for palpitations and leg swelling.  Gastrointestinal: Negative for nausea and vomiting.  Genitourinary: Negative for dysuria.  Musculoskeletal: Negative for joint swelling.  Skin: Negative for rash.  Neurological: Negative for headaches.  Hematological: Does not bruise/bleed easily.  Psychiatric/Behavioral: Negative for dysphoric mood. The patient is not nervous/anxious.    Current outpatient prescriptions:aspirin 81 MG tablet, Take 81 mg by mouth every morning. , Disp: , Rfl: ;  budesonide-formoterol (SYMBICORT) 80-4.5 MCG/ACT inhaler, Inhale 2 puffs into the lungs 2 (two) times daily., Disp: 1 Inhaler, Rfl: 12;  clonazePAM (KLONOPIN) 0.5 MG tablet, TAKE TWO TABLET BY MOUTH EVERY AT BEDTIME, Disp: 60 tablet, Rfl: 5 dextromethorphan-guaiFENesin (MUCINEX DM) 30-600 MG per 12 hr tablet, Take 1 tablet by mouth 2 (two) times daily., Disp: , Rfl: ;  diltiazem (CARDIZEM CD) 120 MG 24 hr capsule, Take 1 capsule (120 mg total) by mouth daily., Disp: 30 capsule, Rfl: 12;  furosemide (LASIX) 20 MG tablet, TAKE ONE TABLET BY MOUTH ONE TIME DAILY , Disp: 30 tablet, Rfl: 3 losartan-hydrochlorothiazide (HYZAAR) 50-12.5 MG per tablet, Take 1 tablet by mouth every morning., Disp: , Rfl: ;  ondansetron (ZOFRAN) 4 MG tablet, Take 4 mg by mouth every 4 (four) hours as needed for nausea or vomiting., Disp: , Rfl: ;  potassium chloride (K-DUR) 10 MEQ tablet, Take 10 mEq by mouth 2 (two) times daily., Disp: , Rfl:  tiotropium (SPIRIVA) 18 MCG inhalation capsule,  Place 1 capsule (18 mcg total) into inhaler and inhale daily., Disp: 30 capsule, Rfl: 12;  vitamin E 100 UNIT capsule, Take 100 Units by mouth every morning. , Disp: , Rfl: ;  albuterol (PROVENTIL HFA;VENTOLIN HFA) 108 (90 BASE) MCG/ACT inhaler, Inhale 2 puffs into the lungs every 4 (four) hours as needed for wheezing or shortness of breath., Disp: 1 Inhaler, Rfl: 11      Objective:   Physical Exam  Filed Vitals:   01/24/14 1434  BP: 142/60  Pulse: 124  Height: 5\' 2"  (1.575 m)  Weight: 118 lb (53.524 kg)  SpO2: 93%   GEN: A/Ox3; pleasant , NAD   HEENT:  [place]/AT,  EACs-clear, TMs-wnl, NOSE-clear, THROAT-clear, no lesions, no postnasal drip or exudate noted.   NECK:  Supple w/ fair ROM; no JVD; normal carotid impulses w/o bruits; no thyromegaly or nodules palpated; no lymphadenopathy.  RESP  Diminished BS in bases no accessory muscle use, no dullness to percussion  CARD:  RRR, SM 1/6   , tr peripheral edema, pulses intact, no cyanosis or clubbing.  GI:   Soft & nt; nml bowel sounds; no organomegaly or masses detected.  Musco: Warm bil, no deformities or joint swelling noted.   Neuro: alert, no focal deficits noted.    Skin: Warm, no lesions or rashes              Assessment & Plan:  You have moderate stage  2 copd  COntinuje spiriva and symbicort scheduled Use albuerol as needed Have flu shot in fall asap Will refer you to research trials; glad you want to think about it Quti smoking  Followup  3 months or sooner if needed Refer rehab at followup

## 2014-01-24 NOTE — Patient Instructions (Addendum)
You have moderate stage  2 copd  COntinuje spiriva and symbicort scheduled Use albuerol as needed Have flu shot in fall asap Will refer you to research trials; glad you want to think about it   Followup  3 months or sooner if needed Refer rehab at followup

## 2014-01-25 NOTE — Assessment & Plan Note (Signed)
You have moderate stage  2 copd  COntinuje spiriva and symbicort scheduled Use albuerol as needed Have flu shot in fall asap Will refer you to research trials; glad you want to think about it Quit smokng    Followup  3 months or sooner if needed Refer rehab at followup  > 50% of this > 25 min visit spent in face to face counseling (15 min visit converted to 25 min)

## 2014-01-30 LAB — PULMONARY FUNCTION TEST
DL/VA % pred: 48 %
DL/VA: 2.19 ml/min/mmHg/L
DLCO unc % pred: 37 %
DLCO unc: 8.11 ml/min/mmHg
FEF 25-75 POST: 0.46 L/s
FEF 25-75 Pre: 0.58 L/sec
FEF2575-%CHANGE-POST: -21 %
FEF2575-%PRED-POST: 29 %
FEF2575-%Pred-Pre: 37 %
FEV1-%Change-Post: -9 %
FEV1-%PRED-POST: 61 %
FEV1-%Pred-Pre: 67 %
FEV1-POST: 1.19 L
FEV1-PRE: 1.3 L
FEV1FVC-%CHANGE-POST: -3 %
FEV1FVC-%PRED-PRE: 76 %
FEV6-%Change-Post: -3 %
FEV6-%Pred-Post: 86 %
FEV6-%Pred-Pre: 89 %
FEV6-Post: 2.1 L
FEV6-Pre: 2.19 L
FEV6FVC-%Change-Post: 2 %
FEV6FVC-%PRED-PRE: 101 %
FEV6FVC-%Pred-Post: 103 %
FVC-%CHANGE-POST: -5 %
FVC-%Pred-Post: 83 %
FVC-%Pred-Pre: 88 %
FVC-Post: 2.13 L
FVC-Pre: 2.26 L
POST FEV1/FVC RATIO: 56 %
PRE FEV1/FVC RATIO: 58 %
PRE FEV6/FVC RATIO: 97 %
Post FEV6/FVC ratio: 99 %
RV % PRED: 128 %
RV: 2.81 L
TLC % pred: 114 %
TLC: 5.45 L

## 2014-02-07 ENCOUNTER — Encounter: Payer: Medicare Other | Admitting: Internal Medicine

## 2014-02-17 ENCOUNTER — Ambulatory Visit (INDEPENDENT_AMBULATORY_CARE_PROVIDER_SITE_OTHER): Payer: Medicare Other | Admitting: Internal Medicine

## 2014-02-17 VITALS — BP 136/52 | HR 109 | Ht 61.0 in | Wt 124.0 lb

## 2014-02-17 DIAGNOSIS — I10 Essential (primary) hypertension: Secondary | ICD-10-CM

## 2014-02-17 LAB — BASIC METABOLIC PANEL
BUN: 11 mg/dL (ref 6–23)
CALCIUM: 9.3 mg/dL (ref 8.4–10.5)
CO2: 29 meq/L (ref 19–32)
Chloride: 89 mEq/L — ABNORMAL LOW (ref 96–112)
Creatinine, Ser: 0.6 mg/dL (ref 0.4–1.2)
GFR: 97.84 mL/min (ref >60.00)
GLUCOSE: 90 mg/dL (ref 70–99)
Potassium: 3.1 mEq/L — ABNORMAL LOW (ref 3.5–5.1)
SODIUM: 134 meq/L — AB (ref 135–145)

## 2014-02-17 LAB — CBC
HEMATOCRIT: 39.7 % (ref 36.0–46.0)
Hemoglobin: 13.3 g/dL (ref 12.0–15.0)
MCHC: 33.5 g/dL (ref 30.0–36.0)
MCV: 99.2 fl (ref 78.0–100.0)
PLATELETS: 334 10*3/uL (ref 150.0–400.0)
RBC: 4 Mil/uL (ref 3.87–5.11)
RDW: 14.6 % (ref 11.5–15.5)
WBC: 10 10*3/uL (ref 4.0–10.5)

## 2014-02-17 NOTE — Progress Notes (Signed)
HPI Jill White is a 73 yo who presents for continued care. She was admitted to [facility] with resp failure. Seen by cardiology  Had echo that showed mild LV dysfunction and moderate to severe AS Underwent R and L heart cath that showed calcified coronary arteries but no signif CAD Mild AS with a mean gradient of 7 mm Hg.  Milldy elevated R sided pressures.  LVEF normal at 60^    Patient denis CP  Says breathing is good.  No dizziness  No CP  No palpitions Still smoking  Patches at home did not help.   Allergies  Allergen Reactions  . Tetanus Toxoid Anaphylaxis  . Codeine Nausea And Vomiting and Other (See Comments)    Itching and faint   . Erythromycin Nausea And Vomiting  . Meloxicam Itching  . Methylprednisolone Hives  . Penicillins Hives  . Prednisone Other (See Comments)    GI bleed    Current Outpatient Prescriptions  Medication Sig Dispense Refill  . albuterol (PROVENTIL HFA;VENTOLIN HFA) 108 (90 BASE) MCG/ACT inhaler Inhale 2 puffs into the lungs every 4 (four) hours as needed for wheezing or shortness of breath.  1 Inhaler  11  . aspirin 81 MG tablet Take 81 mg by mouth every morning.       . budesonide-formoterol (SYMBICORT) 80-4.5 MCG/ACT inhaler Inhale 2 puffs into the lungs 2 (two) times daily.  1 Inhaler  12  . clonazePAM (KLONOPIN) 0.5 MG tablet TAKE TWO TABLET BY MOUTH EVERY AT BEDTIME  60 tablet  5  . dextromethorphan-guaiFENesin (MUCINEX DM) 30-600 MG per 12 hr tablet Take 1 tablet by mouth 2 (two) times daily.      Marland Kitchen diltiazem (CARDIZEM CD) 120 MG 24 hr capsule Take 1 capsule (120 mg total) by mouth daily.  30 capsule  12  . furosemide (LASIX) 20 MG tablet TAKE ONE TABLET BY MOUTH ONE TIME DAILY   30 tablet  3  . losartan-hydrochlorothiazide (HYZAAR) 50-12.5 MG per tablet Take 1 tablet by mouth every morning.      . ondansetron (ZOFRAN) 4 MG tablet Take 4 mg by mouth every 4 (four) hours as needed for nausea or vomiting.      . potassium chloride (K-DUR) 10 MEQ tablet  Take 10 mEq by mouth 2 (two) times daily.      Marland Kitchen tiotropium (SPIRIVA) 18 MCG inhalation capsule Place 1 capsule (18 mcg total) into inhaler and inhale daily.  30 capsule  12  . vitamin E 100 UNIT capsule Take 100 Units by mouth every morning.        No current facility-administered medications for this visit.    Past Medical History  Diagnosis Date  . HIP PAIN   . HYPERTENSION   . Restless leg syndrome   . COPD (chronic obstructive pulmonary disease)     a. Multiple prior admissions for acute hypoxic respiratory failure in setting of COPD exacerbations +/- pneumonia.  . Peptic ulcer disease     a. 2013: gastric antrum.  Marland Kitchen Hyponatremia   . Tobacco abuse   . Hypomagnesemia   . LBBB (left bundle branch block)   . Pulmonary HTN     a. Elevated PA pressure by prior echoes.  . Mitral regurgitation     a. Mild MR by echo 2014, not mentioned on 11/2013 (severely calcified annulus).    Past Surgical History  Procedure Laterality Date  . Abdominal hysterectomy    . Dilation and curettage of uterus    .  Tonsillectomy    . Esophagogastroduodenoscopy  07/09/2011    Procedure: ESOPHAGOGASTRODUODENOSCOPY (EGD);  Surgeon: Missy Sabins, MD;  Location: [facility];  Service: Endoscopy;  Laterality: N/A;  patient in room 1532  . Cataract extraction w/ intraocular lens  implant, bilateral Bilateral 03/2013    Patient claims it was within the month.     Family History  Problem Relation Age of Onset  . Heart disease Sister   . Heart disease Brother   . Dementia Mother   . Anemia Father     History   Social History  . Marital Status: Widowed    Spouse Name: N/A    Number of Children: N/A  . Years of Education: N/A   Occupational History  . Not on file.   Social History Main Topics  . Smoking status: Current Every Day Smoker -- 1.00 packs/day for 50 years    Types: Cigarettes  . Smokeless tobacco: Never Used     Comment: last cig was 11/22/13  . Alcohol Use: 0.5 oz/week    1  drink(s) per week  . Drug Use: No  . Sexual Activity: No   Other Topics Concern  . Not on file   Social History Narrative  . No narrative on file    Review of Systems:  All systems reviewed.  They are negative to the above problem except as previously stated.  Vital Signs: BP 136/52  Pulse 109  Ht 5\' 1"  (1.549 m)  Wt 124 lb (56.246 kg)  BMI 23.44 kg/m2  Physical Exam Patient is in NAD  HEENT:  Normocephalic, atraumatic. EOMI, PERRLA.  Neck: JVP is normal.  No bruits.  Lungs: Decreased airflow  Heart: Regular rate and rhythm. Normal S1, S2. No S3.  III/VI systolic murmur at base   PMI not displaced.  Abdomen:  Supple, nontender. Normal bowel sounds. No masses. No hepatomegaly.  Extremities:   Good distal pulses throughout. No lower extremity edema.  Musculoskeletal :moving all extremities.  Neuro:   alert and oriented x3.  CN II-XII grossly intact.  EKG  ST 109  LBBB Assessment and Plan:  1  COPD  Significant  Followed up in pulmonary  2.  Diastolic CHF  Patient with LVEF normal  Volume status looks OK on exam  Check BMET Patient had L heart cath that showed no signif CAD  Had calcifications Patient has tendency to have faster HR  Keep on dilt   4  LBBB  5.  AS  Mild at cath  [facility] need to be followed.    6 Tob  Counselled on cessation.

## 2014-02-17 NOTE — Patient Instructions (Signed)
Your physician recommends that you continue on your current medications as directed. Please refer to the Current Medication list given to you today. Your physician recommends that you return for lab work today (BMET, BNP) Your physician wants you to follow-up in: February 2016 with Dr. Harrington Challenger.  You will receive a reminder letter in the mail two months in advance. If you don't receive a letter, please call our office to schedule the follow-up appointment.

## 2014-02-21 ENCOUNTER — Telehealth: Payer: Self-pay | Admitting: Internal Medicine

## 2014-02-21 DIAGNOSIS — E876 Hypokalemia: Secondary | ICD-10-CM

## 2014-02-21 MED ORDER — POTASSIUM CHLORIDE ER 20 MEQ PO TBCR: 20.0000 meq | EXTENDED_RELEASE_TABLET | Freq: Two times a day (BID) | ORAL | Status: DC

## 2014-02-21 NOTE — Telephone Encounter (Signed)
New problem   Pt returning call from [facility]. Please call pt.

## 2014-02-21 NOTE — Telephone Encounter (Signed)
Note from lab results: Notes Recorded by Fay Records, MD on 02/19/2014 at 10:13 AM CBC is normal K is low Needs to increase KCL to 20 mEQ bid *she is on 10 bid) F;U BMET in 10 days. ------------------------------------------------------------------------------- Reviewed CBC & BMET lab results from 8/31 with patient. Pt verbalized understanding of increasing KCL to 20 BID, no rx sent per pt request. F/U BMET scheduled for 03/03/14.

## 2014-02-25 ENCOUNTER — Telehealth: Payer: Self-pay | Admitting: Internal Medicine

## 2014-02-25 NOTE — Telephone Encounter (Signed)
Spoke with patient and she did increase her K+ from 10 meq BID to 20 meq BID for about 3 days and nausea so bad that she didn't take for 1 day and then resumed at 10 meq BID. Prior to lab on 02/17/14 she was taking her K+ 10 meq about once daily alternating with twice a day secondary to nausea. Advised to try to take 10 meq twice daily and increase her potassium intake in foods (mailed her a potassium food printout) and would forward to Dr Harrington Challenger for review.

## 2014-02-25 NOTE — Telephone Encounter (Signed)
New message    Patient calling Dr.Ross  double medication  K+  & causing her to  nausea. Is there something else she can take.

## 2014-02-26 NOTE — Telephone Encounter (Signed)
Check BMET on Monday. May end up needing to change Losartan/HCTZ if K remains low. Try som mrs dash  Take 10 bid.

## 2014-02-27 NOTE — Telephone Encounter (Signed)
Informed patient. Also reviewed dietary aspect of potassium further. She is able to continue 10 meq bid. Will add Mrs. Dash. appt for bmet monday

## 2014-03-03 ENCOUNTER — Other Ambulatory Visit (INDEPENDENT_AMBULATORY_CARE_PROVIDER_SITE_OTHER): Payer: Medicare Other

## 2014-03-03 ENCOUNTER — Other Ambulatory Visit: Payer: Medicare Other

## 2014-03-03 DIAGNOSIS — E876 Hypokalemia: Secondary | ICD-10-CM

## 2014-03-03 LAB — BASIC METABOLIC PANEL
BUN: 19 mg/dL (ref 6–23)
CHLORIDE: 91 meq/L — AB (ref 96–112)
CO2: 30 meq/L (ref 19–32)
Calcium: 8.9 mg/dL (ref 8.4–10.5)
Creatinine, Ser: 0.8 mg/dL (ref 0.4–1.2)
GFR: 76.46 mL/min (ref >60.00)
Glucose, Bld: 90 mg/dL (ref 70–99)
Potassium: 3.3 mEq/L — ABNORMAL LOW (ref 3.5–5.1)
Sodium: 132 mEq/L — ABNORMAL LOW (ref 135–145)

## 2014-03-06 ENCOUNTER — Telehealth: Payer: Self-pay | Admitting: Internal Medicine

## 2014-03-06 DIAGNOSIS — E876 Hypokalemia: Secondary | ICD-10-CM

## 2014-03-06 MED ORDER — LOSARTAN POTASSIUM 50 MG PO TABS: 50.0000 mg | ORAL_TABLET | Freq: Every day | ORAL | Status: DC

## 2014-03-06 NOTE — Telephone Encounter (Signed)
New message ° ° ° ° °Want lab results °

## 2014-03-06 NOTE — Telephone Encounter (Signed)
Message copied by Rodman Key on Thu Mar 06, 2014  5:08 PM ------      Message from: Dorris Carnes V      Created: Mon Mar 03, 2014 10:59 PM       K is still a little low.        I would switch hyzaar 50/12/5 to just cozaar      For next 4 days take 20 KCL 3x per day then back to bid      CHeck BMET in 10 days. ------

## 2014-03-06 NOTE — Telephone Encounter (Signed)
Discussed with patient. She will pick up new prescription. Will come back for labs on 9/28, and will increase potassium for next 4 days only.

## 2014-03-14 ENCOUNTER — Ambulatory Visit (INDEPENDENT_AMBULATORY_CARE_PROVIDER_SITE_OTHER): Payer: Medicare Other | Admitting: Adult Health

## 2014-03-14 ENCOUNTER — Encounter: Payer: Self-pay | Admitting: Adult Health

## 2014-03-14 VITALS — BP 124/58 | HR 104 | Temp 98.2°F | Ht 62.0 in | Wt 126.0 lb

## 2014-03-14 DIAGNOSIS — J441 Chronic obstructive pulmonary disease with (acute) exacerbation: Secondary | ICD-10-CM

## 2014-03-14 MED ORDER — LEVALBUTEROL HCL 0.63 MG/3ML IN NEBU
0.6300 mg | INHALATION_SOLUTION | Freq: Once | RESPIRATORY_TRACT | Status: AC
  Administered 2014-03-14: 0.63 mg via RESPIRATORY_TRACT

## 2014-03-14 MED ORDER — DOXYCYCLINE HYCLATE 100 MG PO TABS: 100.0000 mg | ORAL_TABLET | Freq: Two times a day (BID) | ORAL | Status: DC

## 2014-03-14 NOTE — Assessment & Plan Note (Signed)
Exacerbation  xopenex neb x 1   Plan  Doxycycline 100mg  Twice daily  For 7 days -take with food Continue on Symbicort and Spiriva Followup with Dr. Chase Caller in 6 weeks and As needed   Please contact office for sooner follow up if symptoms do not improve or worsen or seek emergency care

## 2014-03-14 NOTE — Progress Notes (Signed)
Subjective:    Patient ID: Jill White, female    DOB: Oct 22, 1940, 73 y.o.   MRN: [id number]  HPI    OV 01/24/2014  Chief Complaint  Patient presents with  . Follow-up    Pt with PFT results. Pt saw CY for an acute visit. Pt states her breathing has improved since last visit with CY. Pt c/o prod cough white and yellow mucous, sinus pressure and PND. Pt denies SOB and Cp/tightness.    Followup COPD.  - She was hospitalized in mid June 2015 with acute exacerbation of COPD and acute respiratory failure due to parainfluenza virus 3. She now presents for followup. In the interim she is seen 2 of my colleagues. Condition is stable help with both Spiriva and Symbicort. Pulmonary function test reviewed below shows Gold stage II COPD. She is quite functional. Her main issue is that the inhalers, some hoarseness of voice but there is no thrush. She sings in church but feels she will accept the mild hoarseness. She's also asking questions to understand  COPD better. Overall she is class II d dyspnea with  mild cough with rare sputum production. Discussed COPD research trials and she is interested or at least wants to think about it    Pulmonary function test today 01/24/2014: Postbronchodilator FVC 2.1 L/82%. FEV1 1.2 L or 61%. Ratio 56. Total lung capacity of 5.5/114% predicted. Diffusion of 8.1/37% predicted. No bronchodilator response. Overall GOLD stage II COPD  Imaging  June 2015 CT - bibasal pneumonia June 2015 CXR  - clear  Tobacco  reports that she has been smoking Cigarettes.  She has a 50 pack-year smoking history. She has never used smokeless tobacco.  03/14/2014 Acute OV  Hx of COPD -smoker  Complains of increased SOB, wheezing, congestion, prod cough with some yellow mucus, head congestion w/ PND x10days.  denies any f/c/s, n/v/d, hemoptysis, leg swelling Continues to smoke , advised on smoking cessation  On symbicort and spiriva  No recent abx use.    Review of Systems    Constitutional:   No  weight loss, night sweats,  Fevers, chills, fatigue, or  lassitude.  HEENT:   No headaches,  Difficulty swallowing,  Tooth/dental problems, or  Sore throat,                No sneezing, itching, ear ache,  +nasal congestion, post nasal drip,   CV:  No chest pain,  Orthopnea, PND, swelling in lower extremities, anasarca, dizziness, palpitations, syncope.   GI  No heartburn, indigestion, abdominal pain, nausea, vomiting, diarrhea, change in bowel habits, loss of appetite, bloody stools.   Resp:  .  No chest wall deformity  Skin: no rash or lesions.  GU: no dysuria, change in color of urine, no urgency or frequency.  No flank pain, no hematuria   MS:  No joint pain or swelling.  No decreased range of motion.  No back pain.  Psych:  No change in mood or affect. No depression or anxiety.  No memory loss.         Objective:   Physical Exam  GEN: A/Ox3; pleasant , NAD   HEENT:  [place]/AT,  EACs-clear, TMs-wnl, NOSE-clear drainage  THROAT-clear, no lesions, no postnasal drip or exudate noted.   NECK:  Supple w/ fair ROM; no JVD; normal carotid impulses w/o bruits; no thyromegaly or nodules palpated; no lymphadenopathy.  RESP exp rhonchi and few tr exp wheezing   no accessory muscle use, no dullness to  percussion  CARD:  RRR, SM 1/6   , tr peripheral edema, pulses intact, no cyanosis or clubbing.  GI:   Soft & nt; nml bowel sounds; no organomegaly or masses detected.  Musco: Warm bil, no deformities or joint swelling noted.   Neuro: alert, no focal deficits noted.    Skin: Warm, no lesions or rashes              Assessment & Plan:

## 2014-03-14 NOTE — Patient Instructions (Addendum)
Doxycycline 100mg  Twice daily  For 7 days -take with food Continue on Symbicort and Spiriva Followup with Dr. Chase Caller in 6 weeks and As needed   Please contact office for sooner follow up if symptoms do not improve or worsen or seek emergency care

## 2014-03-14 NOTE — Addendum Note (Signed)
Addended by: Clayborne Dana C on: 03/14/2014 11:58 AM   Modules accepted: Orders

## 2014-03-17 ENCOUNTER — Other Ambulatory Visit (INDEPENDENT_AMBULATORY_CARE_PROVIDER_SITE_OTHER): Payer: Medicare Other | Admitting: *Deleted

## 2014-03-17 DIAGNOSIS — E876 Hypokalemia: Secondary | ICD-10-CM

## 2014-03-17 LAB — BASIC METABOLIC PANEL
BUN: 13 mg/dL (ref 6–23)
CALCIUM: 9 mg/dL (ref 8.4–10.5)
CO2: 32 mEq/L (ref 19–32)
Chloride: 83 mEq/L — ABNORMAL LOW (ref 96–112)
Creatinine, Ser: 0.6 mg/dL (ref 0.4–1.2)
GFR: 105.51 mL/min (ref >60.00)
GLUCOSE: 94 mg/dL (ref 70–99)
Potassium: 3 mEq/L — ABNORMAL LOW (ref 3.5–5.1)
SODIUM: 125 meq/L — AB (ref 135–145)

## 2014-03-18 ENCOUNTER — Other Ambulatory Visit: Payer: Self-pay

## 2014-03-18 ENCOUNTER — Telehealth: Payer: Self-pay | Admitting: Internal Medicine

## 2014-03-18 DIAGNOSIS — E876 Hypokalemia: Secondary | ICD-10-CM

## 2014-03-18 NOTE — Telephone Encounter (Signed)
Patient informed of labs. She has already taken Lasix 20 mg today. Advised to take an extra 18meq of Kdur today, and come ine Wednesday for repeat BMet. Voices understanding.

## 2014-03-18 NOTE — Telephone Encounter (Signed)
New problem   Pt returning a call concerning lab results.

## 2014-03-19 ENCOUNTER — Other Ambulatory Visit: Payer: Medicare Other

## 2014-03-19 ENCOUNTER — Other Ambulatory Visit (INDEPENDENT_AMBULATORY_CARE_PROVIDER_SITE_OTHER): Payer: Medicare Other

## 2014-03-19 DIAGNOSIS — E876 Hypokalemia: Secondary | ICD-10-CM

## 2014-03-19 LAB — BASIC METABOLIC PANEL
BUN: 14 mg/dL (ref 6–23)
CALCIUM: 9.3 mg/dL (ref 8.4–10.5)
CO2: 28 mEq/L (ref 19–32)
CREATININE: 0.7 mg/dL (ref 0.4–1.2)
Chloride: 95 mEq/L — ABNORMAL LOW (ref 96–112)
GFR: 85.21 mL/min (ref >60.00)
Glucose, Bld: 88 mg/dL (ref 70–99)
Potassium: 4.6 mEq/L (ref 3.5–5.1)
SODIUM: 131 meq/L — AB (ref 135–145)

## 2014-03-21 ENCOUNTER — Other Ambulatory Visit: Payer: Self-pay | Admitting: *Deleted

## 2014-03-21 DIAGNOSIS — E876 Hypokalemia: Secondary | ICD-10-CM

## 2014-03-21 DIAGNOSIS — I10 Essential (primary) hypertension: Secondary | ICD-10-CM

## 2014-03-21 MED ORDER — POTASSIUM CHLORIDE ER 20 MEQ PO TBCR: 20.0000 meq | EXTENDED_RELEASE_TABLET | Freq: Two times a day (BID) | ORAL | Status: DC

## 2014-03-25 ENCOUNTER — Telehealth: Payer: Self-pay | Admitting: *Deleted

## 2014-03-25 NOTE — Telephone Encounter (Signed)
Patient called to discuss current potassium dose. She stated that she was told to take 75meq bid, per her labs results from 03/21/14. I do not even see the results from those lab results, but she states that she printed them out from [facility]. Please advise. Thanks, MI

## 2014-03-25 NOTE — Telephone Encounter (Signed)
lmtcb

## 2014-03-26 ENCOUNTER — Telehealth: Payer: Self-pay | Admitting: Internal Medicine

## 2014-03-26 ENCOUNTER — Other Ambulatory Visit: Payer: Self-pay | Admitting: Internal Medicine

## 2014-03-26 ENCOUNTER — Other Ambulatory Visit: Payer: Self-pay | Admitting: *Deleted

## 2014-03-26 ENCOUNTER — Other Ambulatory Visit (INDEPENDENT_AMBULATORY_CARE_PROVIDER_SITE_OTHER): Payer: Medicare Other | Admitting: *Deleted

## 2014-03-26 DIAGNOSIS — R0602 Shortness of breath: Secondary | ICD-10-CM

## 2014-03-26 DIAGNOSIS — I10 Essential (primary) hypertension: Secondary | ICD-10-CM

## 2014-03-26 DIAGNOSIS — Z79899 Other long term (current) drug therapy: Secondary | ICD-10-CM

## 2014-03-26 DIAGNOSIS — E876 Hypokalemia: Secondary | ICD-10-CM

## 2014-03-26 LAB — MAGNESIUM: MAGNESIUM: 1.8 mg/dL (ref 1.5–2.5)

## 2014-03-26 LAB — BASIC METABOLIC PANEL
BUN: 15 mg/dL (ref 6–23)
CALCIUM: 9.5 mg/dL (ref 8.4–10.5)
CO2: 27 mEq/L (ref 19–32)
Chloride: 94 mEq/L — ABNORMAL LOW (ref 96–112)
Creatinine, Ser: 0.7 mg/dL (ref 0.4–1.2)
GFR: 88.07 mL/min (ref >60.00)
Glucose, Bld: 88 mg/dL (ref 70–99)
Potassium: 4 mEq/L (ref 3.5–5.1)
Sodium: 131 mEq/L — ABNORMAL LOW (ref 135–145)

## 2014-03-26 LAB — BRAIN NATRIURETIC PEPTIDE: Pro B Natriuretic peptide (BNP): 94 pg/mL (ref 0.0–100.0)

## 2014-03-26 MED ORDER — POTASSIUM CHLORIDE ER 20 MEQ PO TBCR: 40.0000 meq | EXTENDED_RELEASE_TABLET | Freq: Two times a day (BID) | ORAL | Status: DC

## 2014-03-26 NOTE — Telephone Encounter (Signed)
°  Patient wants a call back right away regarding miss information for Potassium medication. Please call and advise.

## 2014-03-26 NOTE — Telephone Encounter (Signed)
Pt is very concerned about the amount of potassium chl she was instructed to take.  She has been taking a total of 80 meq since 10/2.  Rx sent into her pharmacy states 20 MEQ BID.  Pts K+ is now 4.6 (as of 9/30) with no instructions on when to recheck.  How much K+ should she be on now and when should she have it rechecked?   Of note - her Lasix was stopped 9/29 and she was changed from Losartan/HCTZ to Cozaar on 9/17.  Please advice - pt is not taking any potassium chl until she hears back from Dr Harrington Challenger.

## 2014-03-26 NOTE — Telephone Encounter (Signed)
Patient needs BMET and MG and BNP today Spoke to granddaughter  She will pass msg along Please set up for these

## 2014-03-26 NOTE — Telephone Encounter (Signed)
Spoke with Dr Harrington Challenger who spoke with granddaughter about pt's concerns.  Orders will be placed for labs and further medication orders will be given based on these results.

## 2014-03-31 ENCOUNTER — Other Ambulatory Visit (INDEPENDENT_AMBULATORY_CARE_PROVIDER_SITE_OTHER): Payer: Medicare Other | Admitting: *Deleted

## 2014-03-31 DIAGNOSIS — E876 Hypokalemia: Secondary | ICD-10-CM

## 2014-03-31 LAB — BASIC METABOLIC PANEL
BUN: 13 mg/dL (ref 6–23)
CHLORIDE: 93 meq/L — AB (ref 96–112)
CO2: 30 meq/L (ref 19–32)
CREATININE: 0.7 mg/dL (ref 0.4–1.2)
Calcium: 9 mg/dL (ref 8.4–10.5)
GFR: 91.1 mL/min (ref >60.00)
Glucose, Bld: 86 mg/dL (ref 70–99)
POTASSIUM: 3.9 meq/L (ref 3.5–5.1)
Sodium: 133 mEq/L — ABNORMAL LOW (ref 135–145)

## 2014-03-31 NOTE — Telephone Encounter (Signed)
Further addressed in another encounter.

## 2014-04-06 LAB — ALDOSTERONE + RENIN ACTIVITY W/ RATIO
ALDO / PRA Ratio: 0.4 Ratio — ABNORMAL LOW (ref 0.9–28.9)
Aldosterone: 1 ng/dL
PRA LC/MS/MS: 2.63 ng/mL/h (ref 0.25–5.82)

## 2014-04-08 ENCOUNTER — Telehealth: Payer: Self-pay | Admitting: Adult Health

## 2014-04-08 MED ORDER — DOXYCYCLINE HYCLATE 100 MG PO TABS: 100.0000 mg | ORAL_TABLET | Freq: Two times a day (BID) | ORAL | Status: DC

## 2014-04-08 NOTE — Telephone Encounter (Signed)
Spoke with the pt and notified of recs per TP  She verbalized understanding and denied any questions  Rx was sent to pharm

## 2014-04-08 NOTE — Telephone Encounter (Signed)
Pt also requesting the same meds as b/4.Jill White

## 2014-04-08 NOTE — Telephone Encounter (Signed)
That is fine  Doxycycline 100mg  #14  1 po Twice daily No refills  mucinex dm Twice daily  As needed  Cough/congestion  follow up Dr. Chase Caller in 2 weeks as planned and As needed   Please contact office for sooner follow up if symptoms do not improve or worsen or seek emergency care

## 2014-04-08 NOTE — Telephone Encounter (Signed)
Spoke with pt. She is requesting to have doxy called in for her. She saw TP 03/14/14 and was rx'd this. Per pt she did get better but now her symptoms have came back again. C/o prod cough clear-yellow phlem, nasal and chest congestion, PND, wheezing in upper chest  Per pt. SOB unchanged. Please advise TP thanks  Allergies  Allergen Reactions  . Tetanus Toxoid Anaphylaxis  . Codeine Nausea And Vomiting and Other (See Comments)    Itching and faint   . Erythromycin Nausea And Vomiting  . Meloxicam Itching  . Methylprednisolone Hives  . Penicillins Hives  . Prednisone Other (See Comments)    GI bleed

## 2014-04-11 ENCOUNTER — Encounter: Payer: Self-pay | Admitting: Internal Medicine

## 2014-04-14 ENCOUNTER — Encounter: Payer: Self-pay | Admitting: Internal Medicine

## 2014-04-28 ENCOUNTER — Encounter: Payer: Self-pay | Admitting: Internal Medicine

## 2014-04-28 ENCOUNTER — Ambulatory Visit (INDEPENDENT_AMBULATORY_CARE_PROVIDER_SITE_OTHER): Payer: Medicare Other | Admitting: Internal Medicine

## 2014-04-28 VITALS — BP 132/70 | HR 99 | Ht 61.0 in | Wt 134.0 lb

## 2014-04-28 DIAGNOSIS — J441 Chronic obstructive pulmonary disease with (acute) exacerbation: Secondary | ICD-10-CM

## 2014-04-28 MED ORDER — AZITHROMYCIN 250 MG PO TABS: ORAL_TABLET | ORAL | Status: DC

## 2014-04-28 NOTE — Progress Notes (Signed)
Subjective:    Patient ID: Jill White, female    DOB: 12-06-1940, 73 y.o.   MRN: [id number]  HPI      OV 01/24/2014  Chief Complaint  Patient presents with  . Follow-up    Pt with PFT results. Pt saw CY for an acute visit. Pt states her breathing has improved since last visit with CY. Pt c/o prod cough white and yellow mucous, sinus pressure and PND. Pt denies SOB and Cp/tightness.    Followup COPD.  - She was hospitalized in mid June 2015 with acute exacerbation of COPD and acute respiratory failure due to parainfluenza virus 3. She now presents for followup. In the interim she is seen 2 of my colleagues. Condition is stable help with both Spiriva and Symbicort. Pulmonary function test reviewed below shows Gold stage II COPD. She is quite functional. Her main issue is that the inhalers, some hoarseness of voice but there is no thrush. She sings in church but feels she will accept the mild hoarseness. She's also asking questions to understand  COPD better. Overall she is class II d dyspnea with  mild cough with rare sputum production. Discussed COPD research trials and she is interested or at least wants to think about it    Pulmonary function test today 01/24/2014: Postbronchodilator FVC 2.1 L/82%. FEV1 1.2 L or 61%. Ratio 56. Total lung capacity of 5.5/114% predicted. Diffusion of 8.1/37% predicted. No bronchodilator response. Overall GOLD stage II COPD  Imaging  June 2015 CT - bibasal pneumonia June 2015 CXR  - clear  Tobacco  reports that she has been smoking Cigarettes.  She has a 50 pack-year smoking history. She has never used smokeless tobacco.   You have moderate stage  2 copd  COntinuje spiriva and symbicort scheduled Use albuerol as needed Have flu shot in fall asap Will refer you to research trials; glad you want to think about it   Followup  3 months or sooner if needed Refer rehab at followup  03/14/2014 Acute OV  Hx of COPD -smoker  Complains of  increased SOB, wheezing, congestion, prod cough with some yellow mucus, head congestion w/ PND x10days.  denies any f/c/s, n/v/d, hemoptysis, leg swelling Continues to smoke , advised on smoking cessation  On symbicort and spiriva  No recent abx use.   rx doxy  OV 04/28/2014  Chief Complaint  Patient presents with  . Follow-up    Pt saw TP for f/u and was given doxy. Pt stated she feels like she didnt get better and called in on 10/20 for refill for doxy. Pt states that today she stil feels like she has not improved. Pt c/o chest congestion, cough with intermittent mucus production with clear and yellow mucus. Pt denies CP/tightness.     Follow-up Gold stage II COPD  - She last saw me August and 2015. After that September and 2015 she saw my nurse practitioner and was in a mild flareup and was given outpatient doxycycline. Prednisone was not instituted due to her fear of steroids causing GI bleeding. Then end of August 2015 she called in with a mild flareup and was again given doxycycline. However, she says that she is not fully recovered. Still having increased cough than baseline along with increased sputum volume. Sputum color is white and yellow but this is unchanged compared to baseline. That is increased chest tightness. No associated fever or chills. She is concerned that she has pneumonia but she does not want a  chest x-ray because of increased radiation risk. She also does not want prednisone under any circumstance due to concern of GI bleeding. She says that she tolerated Solu-Medrol in the past because it was IV. She is a little bit more open to trying by mouth Medrol dosepak as opposed to prednisone but at this visit she does not want those.  She is up-to-date with her vaccines as documented below except for Prevnar   Immunization History  Administered Date(s) Administered  . Influenza Split 03/18/2012, 03/20/2013, 04/07/2014  . Influenza Whole 04/20/2005  . Pneumococcal  Polysaccharide-23 03/20/1989, 07/12/2011, 07/12/2011  . Td 01/19/1984  . Zoster 05/24/2011     Review of Systems  Constitutional: Negative for fever and unexpected weight change.  HENT: Positive for congestion. Negative for dental problem, ear pain, nosebleeds, postnasal drip, rhinorrhea, sinus pressure, sneezing, sore throat and trouble swallowing.   Eyes: Negative for redness and itching.  Respiratory: Positive for cough and shortness of breath. Negative for chest tightness and wheezing.   Cardiovascular: Negative for palpitations and leg swelling.  Gastrointestinal: Negative for nausea and vomiting.  Genitourinary: Negative for dysuria.  Musculoskeletal: Negative for joint swelling.  Skin: Negative for rash.  Neurological: Negative for headaches.  Hematological: Does not bruise/bleed easily.  Psychiatric/Behavioral: Negative for dysphoric mood. The patient is not nervous/anxious.    Current outpatient prescriptions: albuterol (PROVENTIL HFA;VENTOLIN HFA) 108 (90 BASE) MCG/ACT inhaler, Inhale 2 puffs into the lungs every 4 (four) hours as needed for wheezing or shortness of breath., Disp: 1 Inhaler, Rfl: 11;  aspirin 81 MG tablet, Take 81 mg by mouth every morning. , Disp: , Rfl: ;  budesonide-formoterol (SYMBICORT) 80-4.5 MCG/ACT inhaler, Inhale 2 puffs into the lungs 2 (two) times daily., Disp: 1 Inhaler, Rfl: 12 clonazePAM (KLONOPIN) 0.5 MG tablet, TAKE TWO TABLET BY MOUTH EVERY AT BEDTIME, Disp: 60 tablet, Rfl: 5;  dextromethorphan-guaiFENesin (MUCINEX DM) 30-600 MG per 12 hr tablet, Take 1 tablet by mouth 2 (two) times daily., Disp: , Rfl: ;  diltiazem (CARDIZEM CD) 120 MG 24 hr capsule, Take 1 capsule (120 mg total) by mouth daily., Disp: 30 capsule, Rfl: 12 furosemide (LASIX) 20 MG tablet, TAKE ONE TABLET BY MOUTH ONE TIME DAILY , Disp: 30 tablet, Rfl: 3;  losartan (COZAAR) 50 MG tablet, Take 1 tablet (50 mg total) by mouth daily., Disp: 90 tablet, Rfl: 3;  ondansetron (ZOFRAN) 4 MG  tablet, Take 4 mg by mouth every 4 (four) hours as needed for nausea or vomiting., Disp: , Rfl: ;  Potassium Chloride ER 20 MEQ TBCR, Take 40 mEq by mouth 2 (two) times daily., Disp: 120 tablet, Rfl: 2 tiotropium (SPIRIVA) 18 MCG inhalation capsule, Place 1 capsule (18 mcg total) into inhaler and inhale daily., Disp: 30 capsule, Rfl: 12;  vitamin E 100 UNIT capsule, Take 100 Units by mouth every morning. , Disp: , Rfl: ;  azithromycin (ZITHROMAX Z-PAK) 250 MG tablet, Take as directed., Disp: 6 each, Rfl: 0     Objective:   Physical Exam  Constitutional: She is oriented to person, place, and time. She appears well-developed and well-nourished. No distress.  Body mass index is 25.33 kg/(m^2).   HENT:  Head: Normocephalic and atraumatic.  Right Ear: External ear normal.  Left Ear: External ear normal.  Mouth/Throat: Oropharynx is clear and moist. No oropharyngeal exudate.  No thrush  Eyes: Conjunctivae and EOM are normal. Pupils are equal, round, and reactive to light. Right eye exhibits no discharge. Left eye exhibits no discharge. No scleral  icterus.  Neck: Normal range of motion. Neck supple. No JVD present. No tracheal deviation present. No thyromegaly present.  Cardiovascular: Normal rate, regular rhythm, normal heart sounds and intact distal pulses.  Exam reveals no gallop and no friction rub.   No murmur heard. Pulmonary/Chest: Effort normal. No respiratory distress. She has no wheezes. She has rales. She exhibits no tenderness.  Crackles at base barrell chest No distress  Abdominal: Soft. Bowel sounds are normal. She exhibits no distension and no mass. There is no tenderness. There is no rebound and no guarding.  Musculoskeletal: Normal range of motion. She exhibits no edema or tenderness.  Lymphadenopathy:    She has no cervical adenopathy.  Neurological: She is alert and oriented to person, place, and time. She has normal reflexes. No cranial nerve deficit. She exhibits normal  muscle tone. Coordination normal.  Skin: Skin is warm and dry. No rash noted. She is not diaphoretic. No erythema. No pallor.  Psychiatric: She has a normal mood and affect. Her behavior is normal. Judgment and thought content normal.  Vitals reviewed.   Filed Vitals:   04/28/14 1142  BP: 132/70  Pulse: 99  Height: 5\' 1"  (1.549 m)  Weight: 134 lb (60.782 kg)  SpO2: 93%          Assessment & Plan:  YOu are in Copd flare up Doubt this is pneumonia Respect reluctance for steroids and chest xray  Plan  - Take ZPAK  - IF not helping, then recommend MEDROL 5 day (as opposed to prednisone) and chest xray -otherwise continue spiriva and symbicort as before - if crackles does not resolve at followup - consider CHEST CT v CXR  FOllouwp  8 weeks with my NP Tammy PReverar at followup

## 2014-04-28 NOTE — Patient Instructions (Addendum)
YOu are in Copd flare up Doubt this is pneumonia Respect reluctance for steroids and chest xray  Plan  - Take ZPAK  - IF not helping, then recommend MEDROL 5 day (as opposed to prednisone) and chest xray -otherwise continue spiriva and symbicort as before - - if crackles does not resolve at followup - consider CHEST CT v CXR   FOllouwp  8 weeks with my NP Tammy PReverar at followupo

## 2014-05-03 ENCOUNTER — Other Ambulatory Visit: Payer: Self-pay | Admitting: Family Medicine

## 2014-05-05 ENCOUNTER — Ambulatory Visit (INDEPENDENT_AMBULATORY_CARE_PROVIDER_SITE_OTHER)
Admission: RE | Admit: 2014-05-05 | Discharge: 2014-05-05 | Disposition: A | Discharge status: Home / Self Care | Payer: Medicare Other | Source: Ambulatory Visit | Attending: Pulmonary Disease | Admitting: Pulmonary Disease

## 2014-05-05 ENCOUNTER — Encounter: Payer: Self-pay | Admitting: Pulmonary Disease

## 2014-05-05 ENCOUNTER — Telehealth: Payer: Self-pay | Admitting: Adult Health

## 2014-05-05 ENCOUNTER — Ambulatory Visit (INDEPENDENT_AMBULATORY_CARE_PROVIDER_SITE_OTHER): Payer: Medicare Other | Admitting: Pulmonary Disease

## 2014-05-05 VITALS — BP 122/70 | HR 99 | Temp 98.4°F | Ht 62.0 in | Wt 131.4 lb

## 2014-05-05 DIAGNOSIS — J441 Chronic obstructive pulmonary disease with (acute) exacerbation: Secondary | ICD-10-CM

## 2014-05-05 MED ORDER — METHYLPREDNISOLONE 2 MG PO TABS: ORAL_TABLET | ORAL | Status: DC

## 2014-05-05 NOTE — Patient Instructions (Signed)
Chest xray today Medrol 2 mg pill >> 2 pills daily for 2 days, 1 pill daily for 2 days Follow up with Tammy Parrett as scheduled for January 2016

## 2014-05-05 NOTE — Assessment & Plan Note (Signed)
Slow to resolve.  I don't think she needs additional Abx.  Will get CXR today.  Will give her course of medrol >> she is reluctant to take prednisone.  She is to continue current inhaler regimen >> she can d/w Dr. Chase Caller about whether this regimen can be adjusted once her breathing symptoms have become stable.

## 2014-05-05 NOTE — Progress Notes (Addendum)
Chief Complaint  Patient presents with  . Follow-up    Pt c/o cough, wheezing, SOB and chest tx x 4 days. Denies using OTC meds for current symptoms. Has had to use Albuterol hfa    History of Present Illness: Jill White is a 73 y.o. female smoker with COPD.  She was seen by Dr. Chase Caller on 04/28/14 for AECOPD and tx with Zpak.  She still has cough with clear sputum and intermittent wheeze.  She denies fever, chest pain, abdominal pain, or leg swelling.  She is using oxygen at night and with activity.  She uses spiriva and symbicort >> she gets sore throat from these.  She has been using albuterol bid >> more than usual.  Past medical hx, Past surgical hx, Medications, Allergies, Family hx, Social hx all reviewed.   Physical Exam:  General - No distress ENT - No sinus tenderness, no oral exudate, no LAN Cardiac - s1s2 regular, 2+ SM Chest - b/l rhonchi Lt > Rt >> partially clear with cough Back - No focal tenderness Abd - Soft, non-tender Ext - No edema Neuro - Normal strength Skin - No rashes Psych - normal mood, and behavior   Assessment/Plan:  Chesley Mires, MD [facility] Pager:  [phone]

## 2014-05-05 NOTE — Telephone Encounter (Signed)
Per 04/28/14 OV: Plan  - Take ZPAK  - IF not helping, then recommend MEDROL 5 day (as opposed to prednisone) and chest xray -otherwise continue spiriva and symbicort as before - - if crackles does not resolve at followup - consider CHEST CT v CXR  Called spoke with pt. She reports since TP does not have an opening today. appt scheduled to see VS this afternoon at 3:15. Nothing further needed

## 2014-05-06 ENCOUNTER — Telehealth: Payer: Self-pay | Admitting: Pulmonary Disease

## 2014-05-06 NOTE — Telephone Encounter (Signed)
Dg Chest 2 View  05/05/2014   CLINICAL DATA:  Short of breath. COPD. History of smoking and hypertension.  EXAM: CHEST  2 VIEW  COMPARISON:  12/10/2013.  FINDINGS: Cardiac silhouette is normal in size. Aorta is mildly uncoiled. No mediastinal or hilar masses or evidence of adenopathy.  Lungs are hyperexpanded. Reticular opacity in the left lateral lung base is stable consistent with scarring. No lung consolidation or edema. No pleural effusion or pneumothorax. No discrete lung mass or nodule.  Bony thorax is demineralized but grossly intact.  IMPRESSION: 1. No acute cardiopulmonary disease. 2. Findings consistent with COPD.  No change from the prior exam.   Electronically Signed   By: Lajean Manes M.D.   On: 05/05/2014 19:40     Will have my nurse inform pt that CXR shows expected changes from COPD.  No evidence for PNA.  No change to current tx plan.

## 2014-05-07 NOTE — Telephone Encounter (Signed)
Results have been explained to patient, pt expressed understanding. Nothing further needed.  

## 2014-05-12 ENCOUNTER — Emergency Department (HOSPITAL_COMMUNITY): Payer: Medicare Other

## 2014-05-12 ENCOUNTER — Encounter (HOSPITAL_COMMUNITY): Payer: Self-pay | Admitting: Emergency Medicine

## 2014-05-12 ENCOUNTER — Inpatient Hospital Stay (HOSPITAL_COMMUNITY)
Admission: EM | Admit: 2014-05-12 | Discharge: 2014-05-22 | DRG: 542 | Disposition: A | Discharge status: Home / Self Care | Payer: Medicare Other | Attending: Internal Medicine | Admitting: Internal Medicine

## 2014-05-12 ENCOUNTER — Telehealth: Payer: Self-pay | Admitting: Internal Medicine

## 2014-05-12 DIAGNOSIS — M439 Deforming dorsopathy, unspecified: Secondary | ICD-10-CM | POA: Diagnosis present

## 2014-05-12 DIAGNOSIS — I350 Nonrheumatic aortic (valve) stenosis: Secondary | ICD-10-CM

## 2014-05-12 DIAGNOSIS — J438 Other emphysema: Secondary | ICD-10-CM

## 2014-05-12 DIAGNOSIS — K5900 Constipation, unspecified: Secondary | ICD-10-CM | POA: Insufficient documentation

## 2014-05-12 DIAGNOSIS — J9620 Acute and chronic respiratory failure, unspecified whether with hypoxia or hypercapnia: Secondary | ICD-10-CM

## 2014-05-12 DIAGNOSIS — I10 Essential (primary) hypertension: Secondary | ICD-10-CM | POA: Diagnosis present

## 2014-05-12 DIAGNOSIS — I447 Left bundle-branch block, unspecified: Secondary | ICD-10-CM

## 2014-05-12 DIAGNOSIS — R0902 Hypoxemia: Secondary | ICD-10-CM

## 2014-05-12 DIAGNOSIS — Z515 Encounter for palliative care: Secondary | ICD-10-CM | POA: Insufficient documentation

## 2014-05-12 DIAGNOSIS — I5040 Unspecified combined systolic (congestive) and diastolic (congestive) heart failure: Secondary | ICD-10-CM

## 2014-05-12 DIAGNOSIS — Z8711 Personal history of peptic ulcer disease: Secondary | ICD-10-CM

## 2014-05-12 DIAGNOSIS — M8448XA Pathological fracture, other site, initial encounter for fracture: Secondary | ICD-10-CM

## 2014-05-12 DIAGNOSIS — S22009A Unspecified fracture of unspecified thoracic vertebra, initial encounter for closed fracture: Secondary | ICD-10-CM | POA: Diagnosis present

## 2014-05-12 DIAGNOSIS — I5043 Acute on chronic combined systolic (congestive) and diastolic (congestive) heart failure: Secondary | ICD-10-CM | POA: Diagnosis present

## 2014-05-12 DIAGNOSIS — Z720 Tobacco use: Secondary | ICD-10-CM

## 2014-05-12 DIAGNOSIS — I5032 Chronic diastolic (congestive) heart failure: Secondary | ICD-10-CM | POA: Insufficient documentation

## 2014-05-12 DIAGNOSIS — Z7951 Long term (current) use of inhaled steroids: Secondary | ICD-10-CM

## 2014-05-12 DIAGNOSIS — Z79899 Other long term (current) drug therapy: Secondary | ICD-10-CM

## 2014-05-12 DIAGNOSIS — J690 Pneumonitis due to inhalation of food and vomit: Secondary | ICD-10-CM | POA: Insufficient documentation

## 2014-05-12 DIAGNOSIS — L299 Pruritus, unspecified: Secondary | ICD-10-CM | POA: Insufficient documentation

## 2014-05-12 DIAGNOSIS — K567 Ileus, unspecified: Secondary | ICD-10-CM | POA: Insufficient documentation

## 2014-05-12 DIAGNOSIS — F419 Anxiety disorder, unspecified: Secondary | ICD-10-CM | POA: Diagnosis present

## 2014-05-12 DIAGNOSIS — M4854XA Collapsed vertebra, not elsewhere classified, thoracic region, initial encounter for fracture: Principal | ICD-10-CM

## 2014-05-12 DIAGNOSIS — J449 Chronic obstructive pulmonary disease, unspecified: Secondary | ICD-10-CM | POA: Diagnosis present

## 2014-05-12 DIAGNOSIS — E871 Hypo-osmolality and hyponatremia: Secondary | ICD-10-CM | POA: Diagnosis present

## 2014-05-12 DIAGNOSIS — J189 Pneumonia, unspecified organism: Secondary | ICD-10-CM

## 2014-05-12 DIAGNOSIS — F17210 Nicotine dependence, cigarettes, uncomplicated: Secondary | ICD-10-CM | POA: Diagnosis present

## 2014-05-12 DIAGNOSIS — R1110 Vomiting, unspecified: Secondary | ICD-10-CM

## 2014-05-12 DIAGNOSIS — R079 Chest pain, unspecified: Secondary | ICD-10-CM

## 2014-05-12 DIAGNOSIS — J9621 Acute and chronic respiratory failure with hypoxia: Secondary | ICD-10-CM | POA: Diagnosis not present

## 2014-05-12 DIAGNOSIS — J9622 Acute and chronic respiratory failure with hypercapnia: Secondary | ICD-10-CM | POA: Diagnosis not present

## 2014-05-12 DIAGNOSIS — Z7982 Long term (current) use of aspirin: Secondary | ICD-10-CM

## 2014-05-12 DIAGNOSIS — T483X5A Adverse effect of antitussives, initial encounter: Secondary | ICD-10-CM | POA: Diagnosis not present

## 2014-05-12 DIAGNOSIS — M549 Dorsalgia, unspecified: Secondary | ICD-10-CM

## 2014-05-12 DIAGNOSIS — J9600 Acute respiratory failure, unspecified whether with hypoxia or hypercapnia: Secondary | ICD-10-CM | POA: Diagnosis present

## 2014-05-12 DIAGNOSIS — G2581 Restless legs syndrome: Secondary | ICD-10-CM | POA: Insufficient documentation

## 2014-05-12 DIAGNOSIS — R112 Nausea with vomiting, unspecified: Secondary | ICD-10-CM

## 2014-05-12 DIAGNOSIS — Z9981 Dependence on supplemental oxygen: Secondary | ICD-10-CM

## 2014-05-12 HISTORY — DX: Abnormal findings on diagnostic imaging of heart and coronary circulation: R93.1

## 2014-05-12 HISTORY — DX: Chronic diastolic (congestive) heart failure: I50.32

## 2014-05-12 HISTORY — DX: Respiratory failure, unspecified, unspecified whether with hypoxia or hypercapnia: J96.90

## 2014-05-12 HISTORY — DX: Tachycardia, unspecified: R00.0

## 2014-05-12 HISTORY — DX: Ventricular tachycardia: I47.2

## 2014-05-12 HISTORY — DX: Nonrheumatic aortic (valve) stenosis: I35.0

## 2014-05-12 LAB — COMPREHENSIVE METABOLIC PANEL
ALBUMIN: 3.6 g/dL (ref 3.5–5.2)
ALT: 12 U/L (ref 0–35)
AST: 15 U/L (ref 0–37)
Alkaline Phosphatase: 86 U/L (ref 39–117)
Anion gap: 15 (ref 5–15)
Anion gap: 12 (ref 5–15)
BILIRUBIN TOTAL: 0.7 mg/dL (ref 0.3–1.2)
BUN: 9 mg/dL (ref 6–23)
CHLORIDE: 90 meq/L — AB (ref 96–112)
CO2: 29 mEq/L (ref 19–32)
CREATININE: 0.47 mg/dL — AB (ref 0.50–1.10)
Calcium: 9.6 mg/dL (ref 8.4–10.5)
GFR calc Af Amer: >90 mL/min (ref >90)
GFR calc non Af Amer: >90 mL/min (ref >90)
Glucose, Bld: 102 mg/dL — ABNORMAL HIGH (ref 70–99)
Potassium: 4 mEq/L (ref 3.7–5.3)
Sodium: 131 mEq/L — ABNORMAL LOW (ref 137–147)
Total Protein: 7.2 g/dL (ref 6.0–8.3)

## 2014-05-12 LAB — PROTIME-INR
INR: 0.96 (ref 0.00–1.49)
Prothrombin Time: 12.9 seconds (ref 11.6–15.2)

## 2014-05-12 LAB — CBC WITH DIFFERENTIAL/PLATELET
BASOS ABS: 0 10*3/uL (ref 0.0–0.1)
Basophils Relative: 0 % (ref 0–1)
Eosinophils Absolute: 0.1 10*3/uL (ref 0.0–0.7)
Eosinophils Relative: 1 % (ref 0–5)
HCT: 43.9 % (ref 36.0–46.0)
Hemoglobin: 15.2 g/dL — ABNORMAL HIGH (ref 12.0–15.0)
LYMPHS ABS: 3.2 10*3/uL (ref 0.7–4.0)
LYMPHS PCT: 25 % (ref 12–46)
MCH: 32.9 pg (ref 26.0–34.0)
MCHC: 34.6 g/dL (ref 30.0–36.0)
MCV: 95 fL (ref 78.0–100.0)
Monocytes Absolute: 1 10*3/uL (ref 0.1–1.0)
Monocytes Relative: 8 % (ref 3–12)
NEUTROS PCT: 66 % (ref 43–77)
Neutro Abs: 8.5 10*3/uL — ABNORMAL HIGH (ref 1.7–7.7)
Platelets: 364 10*3/uL (ref 150–400)
RBC: 4.62 MIL/uL (ref 3.87–5.11)
RDW: 12.9 % (ref 11.5–15.5)
WBC: 12.8 10*3/uL — AB (ref 4.0–10.5)

## 2014-05-12 LAB — I-STAT CHEM 8, ED
BUN: 11 mg/dL (ref 6–23)
CALCIUM ION: 1.04 mmol/L — AB (ref 1.13–1.30)
Chloride: 94 mEq/L — ABNORMAL LOW (ref 96–112)
Creatinine, Ser: 0.5 mg/dL (ref 0.50–1.10)
GLUCOSE: 102 mg/dL — AB (ref 70–99)
HEMATOCRIT: 54 % — AB (ref 36.0–46.0)
Hemoglobin: 18.4 g/dL — ABNORMAL HIGH (ref 12.0–15.0)
Potassium: 5.9 mEq/L — ABNORMAL HIGH (ref 3.7–5.3)
Sodium: 129 mEq/L — ABNORMAL LOW (ref 137–147)
TCO2: 32 mmol/L (ref 0–100)

## 2014-05-12 LAB — PRO B NATRIURETIC PEPTIDE: Pro B Natriuretic peptide (BNP): 577.4 pg/mL — ABNORMAL HIGH (ref 0–450)

## 2014-05-12 LAB — I-STAT TROPONIN, ED: TROPONIN I, POC: 0 ng/mL (ref 0.00–0.08)

## 2014-05-12 LAB — LIPASE, BLOOD
LIPASE: 14 U/L (ref 11–59)
Lipase: 20 U/L (ref 11–59)

## 2014-05-12 LAB — TROPONIN I: Troponin I: <0.3 ng/mL (ref <0.30)

## 2014-05-12 MED ORDER — OXYCODONE-ACETAMINOPHEN 5-325 MG PO TABS
1.0000 | ORAL_TABLET | Freq: Once | ORAL | Status: DC
  Filled 2014-05-12: qty 1

## 2014-05-12 MED ORDER — TIOTROPIUM BROMIDE MONOHYDRATE 18 MCG IN CAPS
18.0000 ug | ORAL_CAPSULE | Freq: Every day | RESPIRATORY_TRACT | Status: DC
Start: 2014-05-13 — End: 2014-05-22
  Administered 2014-05-13 – 2014-05-22 (×10): 18 ug via RESPIRATORY_TRACT
  Filled 2014-05-12 (×4): qty 5

## 2014-05-12 MED ORDER — TRAZODONE HCL 50 MG PO TABS
50.0000 mg | ORAL_TABLET | Freq: Every evening | ORAL | Status: DC | PRN
Start: 2014-05-12 — End: 2014-05-14
  Administered 2014-05-13 – 2014-05-14 (×2): 50 mg via ORAL
  Filled 2014-05-12 (×2): qty 1

## 2014-05-12 MED ORDER — SODIUM CHLORIDE 0.9 % IJ SOLN
3.0000 mL | Freq: Two times a day (BID) | INTRAMUSCULAR | Status: DC
  Administered 2014-05-12 – 2014-05-18 (×9): 3 mL via INTRAVENOUS

## 2014-05-12 MED ORDER — POTASSIUM CHLORIDE CRYS ER 20 MEQ PO TBCR
40.0000 meq | EXTENDED_RELEASE_TABLET | Freq: Two times a day (BID) | ORAL | Status: DC
  Administered 2014-05-13 (×2): 40 meq via ORAL
  Filled 2014-05-12 (×2): qty 2

## 2014-05-12 MED ORDER — MORPHINE SULFATE 4 MG/ML IJ SOLN
4.0000 mg | INTRAMUSCULAR | Status: DC | PRN
  Administered 2014-05-12 – 2014-05-13 (×6): 4 mg via INTRAVENOUS
  Filled 2014-05-12 (×6): qty 1

## 2014-05-12 MED ORDER — CETYLPYRIDINIUM CHLORIDE 0.05 % MT LIQD
7.0000 mL | Freq: Two times a day (BID) | OROMUCOSAL | Status: DC
  Administered 2014-05-13 – 2014-05-18 (×8): 7 mL via OROMUCOSAL

## 2014-05-12 MED ORDER — DILTIAZEM HCL ER COATED BEADS 120 MG PO CP24
120.0000 mg | ORAL_CAPSULE | Freq: Every day | ORAL | Status: DC
  Administered 2014-05-13 – 2014-05-14 (×2): 120 mg via ORAL
  Filled 2014-05-12 (×2): qty 1

## 2014-05-12 MED ORDER — ASPIRIN 81 MG PO CHEW
81.0000 mg | CHEWABLE_TABLET | Freq: Every morning | ORAL | Status: DC
  Administered 2014-05-13 – 2014-05-22 (×10): 81 mg via ORAL
  Filled 2014-05-12 (×11): qty 1

## 2014-05-12 MED ORDER — ALBUTEROL SULFATE (2.5 MG/3ML) 0.083% IN NEBU: 2.5000 mg | INHALATION_SOLUTION | RESPIRATORY_TRACT | Status: DC | PRN

## 2014-05-12 MED ORDER — LOSARTAN POTASSIUM 50 MG PO TABS
50.0000 mg | ORAL_TABLET | Freq: Every day | ORAL | Status: DC
  Administered 2014-05-13: 50 mg via ORAL
  Filled 2014-05-12 (×2): qty 1

## 2014-05-12 MED ORDER — POLYETHYLENE GLYCOL 3350 17 G PO PACK
17.0000 g | PACK | Freq: Every day | ORAL | Status: DC | PRN
  Administered 2014-05-15: 17 g via ORAL
  Filled 2014-05-12 (×3): qty 1

## 2014-05-12 MED ORDER — ONDANSETRON HCL 4 MG PO TABS
4.0000 mg | ORAL_TABLET | ORAL | Status: DC | PRN
  Administered 2014-05-12 – 2014-05-13 (×2): 4 mg via ORAL
  Filled 2014-05-12 (×2): qty 1

## 2014-05-12 MED ORDER — SODIUM CHLORIDE 0.9 % IV SOLN
INTRAVENOUS | Status: DC
  Administered 2014-05-12: 21:00:00 via INTRAVENOUS

## 2014-05-12 MED ORDER — HEPARIN SODIUM (PORCINE) 5000 UNIT/ML IJ SOLN
5000.0000 [IU] | Freq: Three times a day (TID) | INTRAMUSCULAR | Status: DC
  Administered 2014-05-12 – 2014-05-22 (×30): 5000 [IU] via SUBCUTANEOUS
  Filled 2014-05-12 (×33): qty 1

## 2014-05-12 MED ORDER — SENNA 8.6 MG PO TABS
1.0000 | ORAL_TABLET | Freq: Two times a day (BID) | ORAL | Status: DC
  Administered 2014-05-12 – 2014-05-20 (×17): 8.6 mg via ORAL
  Filled 2014-05-12 (×20): qty 1

## 2014-05-12 MED ORDER — SODIUM CHLORIDE 0.9 % IJ SOLN: 3.0000 mL | INTRAMUSCULAR | Status: DC | PRN

## 2014-05-12 MED ORDER — BUDESONIDE-FORMOTEROL FUMARATE 80-4.5 MCG/ACT IN AERO
2.0000 | INHALATION_SPRAY | Freq: Two times a day (BID) | RESPIRATORY_TRACT | Status: DC
  Administered 2014-05-12 – 2014-05-22 (×19): 2 via RESPIRATORY_TRACT
  Filled 2014-05-12 (×3): qty 6.9

## 2014-05-12 MED ORDER — CLONAZEPAM 0.5 MG PO TABS
0.5000 mg | ORAL_TABLET | Freq: Every day | ORAL | Status: DC
  Administered 2014-05-12 – 2014-05-13 (×2): 0.5 mg via ORAL
  Filled 2014-05-12 (×2): qty 1

## 2014-05-12 MED ORDER — SODIUM CHLORIDE 0.9 % IV SOLN
250.0000 mL | INTRAVENOUS | Status: DC | PRN
  Administered 2014-05-14: 250 mL via INTRAVENOUS

## 2014-05-12 MED ORDER — MORPHINE SULFATE 2 MG/ML IJ SOLN
2.0000 mg | Freq: Once | INTRAMUSCULAR | Status: AC
  Administered 2014-05-12: 2 mg via INTRAVENOUS
  Filled 2014-05-12: qty 1

## 2014-05-12 MED ORDER — CALCITONIN (SALMON) 200 UNIT/ACT NA SOLN
1.0000 | Freq: Every day | NASAL | Status: DC
  Administered 2014-05-12 – 2014-05-22 (×11): 1 via NASAL
  Filled 2014-05-12 (×3): qty 3.7

## 2014-05-12 MED ORDER — FUROSEMIDE 20 MG PO TABS
20.0000 mg | ORAL_TABLET | Freq: Every day | ORAL | Status: DC
  Administered 2014-05-13 – 2014-05-14 (×2): 20 mg via ORAL
  Filled 2014-05-12 (×2): qty 1

## 2014-05-12 MED ORDER — ALBUTEROL SULFATE HFA 108 (90 BASE) MCG/ACT IN AERS: 2.0000 | INHALATION_SPRAY | RESPIRATORY_TRACT | Status: DC | PRN

## 2014-05-12 MED ORDER — ONDANSETRON HCL 4 MG/2ML IJ SOLN
4.0000 mg | Freq: Once | INTRAMUSCULAR | Status: AC
  Administered 2014-05-12: 4 mg via INTRAVENOUS
  Filled 2014-05-12: qty 2

## 2014-05-12 MED ORDER — NICOTINE 14 MG/24HR TD PT24
14.0000 mg | MEDICATED_PATCH | Freq: Every day | TRANSDERMAL | Status: DC
  Administered 2014-05-12 – 2014-05-22 (×11): 14 mg via TRANSDERMAL
  Filled 2014-05-12 (×12): qty 1

## 2014-05-12 MED ORDER — IOHEXOL 350 MG/ML SOLN
100.0000 mL | Freq: Once | INTRAVENOUS | Status: AC | PRN
  Administered 2014-05-12: 100 mL via INTRAVENOUS

## 2014-05-12 MED ORDER — ACETAMINOPHEN 500 MG PO TABS
1000.0000 mg | ORAL_TABLET | Freq: Three times a day (TID) | ORAL | Status: DC
  Administered 2014-05-12 – 2014-05-16 (×8): 1000 mg via ORAL
  Filled 2014-05-12 (×13): qty 2

## 2014-05-12 MED ORDER — FENTANYL CITRATE 0.05 MG/ML IJ SOLN
25.0000 ug | Freq: Once | INTRAMUSCULAR | Status: AC
  Administered 2014-05-12: 25 ug via INTRAVENOUS
  Filled 2014-05-12: qty 2

## 2014-05-12 NOTE — ED Notes (Signed)
Pt requesting pain medication, MD Zenia Resides made aware.

## 2014-05-12 NOTE — Consult Note (Addendum)
Cardiology Consultation Note  Patient ID: Jill White, MRN: [id number], DOB/AGE: 1941-01-06 73 y.o. Admit date: 05/12/2014   Date of Consult: 05/12/2014 Primary Physician: Laurey Morale, MD Primary Cardiologist: [place]  Chief Complaint: chest pain Reason for Consult: chest pain/back pain, possible T6 compression fracture in ER  HPI: Jill White is a 73 y/o F with history of COPD (multiple prior admissions for acute respiratory failure in setting of exacerbations, uses O2 PRN), restless leg syndrome, gastric ulcer, intermittent LBBB, no significant CAD by cath 11/2013 who presented to [facility] with back pain/chest pain.  She was seen by cardiology in 04/2013 for a newly recognized LBBB (although previously seen in 2012) with normal LV function at that time. She was admitted 11/2013 with SOB, cough, dyspnea, fever, progressing severe hypercarbic and hypoxemic respiratory failure requiring intubation. 2D echo was obtained showing decreased EF 45-50%, moderate AS. She subsequently underwent cath 12/02/13 showing mildly calcified cors with no obstructive CAD, mild AS, preserved LV function EF 60%, mild pulm HTN. During that admission she also had wide complex tachycardia suspect a-tach with rate related LBBB. She has followed up as an outpatient for management of diastolic CHF with Dr. Harrington Challenger and has not really had further issues with this.  1 week ago she saw Dr. Halford Chessman for bronchitis and productive cough. She was prescribed Medrol and Zithromax. Her cough has significantly improved since that time. However on Friday 05/09/14 she began to notice chest wall pain that felt like muscle spasms going into her mid back. This has been persistent as a background pain since that time, but becomes significantly worse as a sharp discomfort with any kind of subtle movement. This pain is strong enough to bring her to her knees. Laying on her back exacerbates the pain. She has had to sit up in a chair. She tried  using a heating pad with no relief. She has not tried any medications for this. Her symptoms have impaired her ability to get around the house as usual. She feels her COPD is stable with no significant dyspnea. She denies LEE, vomiting, bleeding issues, LEE, orthopnea, recent travel/surgery/bedrest, or syncope. She was unable to sleep last night due to the pain so came to the ER today.  She has been given multiple doses of morphine in the ER which has taken the edge off but she is still apprehensive about making even small movements.  CXR: COPD, no acute abnormality. CT: + Moderate emphysema, no evidence of intramural hematoma or aortic dissection, no acute vascular pathology, + worsening T6 compression deformity since the prior study dated November 22, 2013. Note no effusion, PE, or edema. She is also noted to intermittently desaturate in the ED down to lowest of 76% while using bedside commode. Labs pertinent for hyponatremia 131, K 4.0 (high on unreliable istat), Hgb 15.2, troponins negative x 2, pBNP 577, WBC 12.8.  Past Medical History  Diagnosis Date  . HIP PAIN   . HYPERTENSION   . Restless leg syndrome   . COPD (chronic obstructive pulmonary disease)     a. Multiple prior admissions for acute hypoxic respiratory failure in setting of COPD exacerbations +/- pneumonia.  . Peptic ulcer disease     a. 2013: gastric antrum.  Marland Kitchen Hyponatremia   . Tobacco abuse   . Hypomagnesemia   . LBBB (left bundle branch block)   . Pulmonary HTN     a. Elevated PA pressure by prior echoes.  . Mitral regurgitation  a. Mild MR by echo 2014, not mentioned on 11/2013 (severely calcified annulus).  Marland Kitchen Respiratory failure     a. Adm 11/2013 with severe hypercarbic and hypoxemic respiratory failure requiring intubation.   . Abnormal echocardiogram     a. 11/2013: decreased EF 45-50%, moderate AS- > subsequent cath 12/02/13 with mildly calcified cors without obstruction, mild AS, EF 60%, mild pulm HTN.  Marland Kitchen Aortic stenosis      a. 11/2013: mod by echo, then mild by cath.  . Pulmonary HTN     a. 11/2013: mild by cath.  . Wide-complex tachycardia     a. Felt likely atrial tach with rate-related LBBB.  Marland Kitchen Chronic diastolic CHF (congestive heart failure)       Most Recent Cardiac Studies: 2D echo 11/23/13 - Left ventricle: The cavity size was normal. There was severe concentric hypertrophy. Systolic function was mildly reduced. The estimated ejection fraction was in the range of 45% to 50%. - Aortic valve: Inadequate Doppler study. Suspect at least moderate aortic stenosis, possibly severe by 2D valve appearance. Consider TEE. - Mitral valve: Severely calcified annulus. - Left atrium: The atrium was moderately to severely dilated. - Atrial septum: The septum bowed from left to right, consistent with increased left atrial pressure. - Pulmonary arteries: PA peak pressure: 48 mm Hg (S). Impressions: - Compared to the study from 2014, LVEF has diminished, there is evidence of hypervolemia and aortic stenosis of unclear severity.  Cardiac Cath 12/02/13   Cardiac Catheterization Procedure Note  Name: Jill White MRN: [id number] DOB: March 10, 1941  Procedure: Right Heart Cath, Left Heart Cath, Selective Coronary Angiography, LV angiography  Indication: CHF, aortic stenosis  Procedural Details: The right groin was prepped, draped, and anesthetized with 1% lidocaine. Using the modified Seldinger technique a 4 French sheath was placed in the right femoral artery and a 7 French sheath was placed in the right femoral vein. A Swan-Ganz catheter was used for the right heart catheterization. Standard protocol was followed for recording of right heart pressures and sampling of oxygen saturations. Fick cardiac output was calculated. Standard Judkins catheters were used for selective coronary angiography and left ventriculography. The aortic valve was crossed easily with a J-wire. There were no immediate procedural  complications. The patient was transferred to the post catheterization recovery area for further monitoring.  Procedural Findings: Hemodynamics RA 6/4/mean 3 RV 33/4 PA 44/20 mean mean 28 PCWP 15/14 mean 13 LV 160/8 AO 150/55 mean 89  Aortic Valve: peak-peak gradient 10 mmHg, Mean gradient 7 mmHg  Oxygen saturations: PA 70 AO 95  Cardiac Output (Fick) 3.6  Cardiac Index (Fick) 2.78  Coronary angiography: Coronary dominance: right  Left mainstem: The left main is moderately calcified. The vessel is widely patent with no obstructive disease.  Left anterior descending (LAD): The LAD arises from left main. The vessel is large in caliber throughout its course. It wraps around the left ventricular apex. The LAD has mild calcification. The vessel is widely patent with only minimal plaque in its proximal aspect. Otherwise there aren't no stenoses in the vessel is smooth throughout its course  Left circumflex (LCx): The left circumflex is moderately calcified. The vessel supplies 2 large obtuse marginal branches. There is no stenosis throughout the course of the left circumflex. The mid vessel has minor irregularity. The OM branches are smooth without significant stenoses.  Right coronary artery (RCA): The right coronary artery is mildly calcified. The vessel is dominant. There are minor irregularities with no significant stenoses noted  throughout. The PDA and PLA branches are widely patent.  Left ventriculography: The anterolateral wall is mildly hypokinetic. The remaining LV wall segments contract normally. The estimated LVEF is 60%  On plain fluoroscopy, there is mild calcification of the aortic valve. There is diffuse coronary calcification noted. The mitral annulus has very severe calcification noted.  Final Conclusions:  1. Mildly calcified coronary arteries with no obstructive CAD noted 2. Mild aortic stenosis 3. Preserved LV systolic function with estimated LVEF of  60% 4. Mild pulmonary hypertension, otherwise normal intracardiac filling pressures Jill White 12/02/2013, 9:32 AM        Surgical History:  Past Surgical History  Procedure Laterality Date  . Abdominal hysterectomy    . Dilation and curettage of uterus    . Tonsillectomy    . Esophagogastroduodenoscopy  07/09/2011    Procedure: ESOPHAGOGASTRODUODENOSCOPY (EGD);  Surgeon: Missy Sabins, MD;  Location: [facility];  Service: Endoscopy;  Laterality: N/A;  patient in room 1532  . Cataract extraction w/ intraocular lens  implant, bilateral Bilateral 03/2013    Patient claims it was within the month.      Home Meds: Prior to Admission medications   Medication Sig Start Date End Date Taking? Authorizing Provider  albuterol (PROVENTIL HFA;VENTOLIN HFA) 108 (90 BASE) MCG/ACT inhaler Inhale 2 puffs into the lungs every 4 (four) hours as needed for wheezing or shortness of breath. 06/24/13  Yes Laurey Morale, MD  aspirin 81 MG tablet Take 81 mg by mouth every morning.    Yes Historical Provider, MD  budesonide-formoterol (SYMBICORT) 80-4.5 MCG/ACT inhaler Inhale 2 puffs into the lungs 2 (two) times daily. 12/03/13  Yes Erick Colace, NP  clonazePAM (KLONOPIN) 0.5 MG tablet TAKE TWO TABLET BY MOUTH EVERY AT BEDTIME 01/13/14  Yes Laurey Morale, MD  diltiazem (CARDIZEM CD) 120 MG 24 hr capsule Take 1 capsule (120 mg total) by mouth daily. 12/03/13  Yes Erick Colace, NP  furosemide (LASIX) 20 MG tablet TAKE ONE TABLET BY MOUTH ONE TIME DAILY  05/05/14  Yes Laurey Morale, MD  guaiFENesin (MUCINEX) 600 MG 12 hr tablet Take 600 mg by mouth 2 (two) times daily.   Yes Historical Provider, MD  losartan (COZAAR) 50 MG tablet Take 1 tablet (50 mg total) by mouth daily. 03/06/14  Yes Fay Records, MD  ondansetron (ZOFRAN) 4 MG tablet Take 4 mg by mouth every 4 (four) hours as needed for nausea or vomiting.   Yes Historical Provider, MD  Potassium Chloride ER 20 MEQ TBCR Take 40 mEq by mouth 2 (two) times  daily. 03/26/14  Yes Fay Records, MD  tiotropium (SPIRIVA) 18 MCG inhalation capsule Place 1 capsule (18 mcg total) into inhaler and inhale daily. 12/03/13  Yes Erick Colace, NP  vitamin E 100 UNIT capsule Take 100 Units by mouth every morning.    Yes Historical Provider, MD  methylPREDNISolone (MEDROL) 2 MG tablet 2 pills daily for 2 days, 1 pill daily for 2 days Patient not taking: Reported on 05/12/2014 05/05/14   Chesley Mires, MD    Inpatient Medications:     Allergies:  Allergies  Allergen Reactions  . Tetanus Toxoid Anaphylaxis  . Codeine Nausea And Vomiting and Other (See Comments)    Itching and faint   . Erythromycin Nausea And Vomiting  . Meloxicam Itching  . Methylprednisolone Hives  . Penicillins Hives  . Prednisone Other (See Comments)    GI bleed    History  Social History  . Marital Status: Widowed    Spouse Name: N/A    Number of Children: N/A  . Years of Education: N/A   Occupational History  . Not on file.   Social History Main Topics  . Smoking status: Current Every Day Smoker -- 1.00 packs/day for 50 years    Types: Cigarettes  . Smokeless tobacco: Never Used     Comment: last cig was 11/22/13  . Alcohol Use: 0.5 oz/week    1 Not specified per week  . Drug Use: No  . Sexual Activity: No   Other Topics Concern  . Not on file   Social History Narrative     Family History  Problem Relation Age of Onset  . Heart disease Sister   . Heart disease Brother   . Dementia Mother   . Anemia Father      Review of Systems: General: negative for chills, fever, weight changes.  Cardiovascular: see above Dermatological: negative for rash Respiratory: negative for wheezing Urologic: negative for hematuria Abdominal: negative for nausea, vomiting, diarrhea, bright red blood per rectum, melena, or hematemesis Neurologic: negative for syncope All other systems reviewed and are otherwise negative except as noted above.  Labs:  Recent Labs   05/12/14 1623  TROPONINI <0.30   Lab Results  Component Value Date   WBC 12.8* 05/12/2014   HGB 18.4* 05/12/2014   HCT 54.0* 05/12/2014   MCV 95.0 05/12/2014   PLT 364 05/12/2014    Recent Labs Lab 05/12/14 1446  NA 131*  K 4.0  CL 90*  CO2 29  BUN 9  CREATININE 0.47*  CALCIUM 9.6  PROT 7.2  BILITOT 0.7  ALKPHOS 86  ALT 12  AST 15  GLUCOSE 102*   Lab Results  Component Value Date   CHOL 166 11/04/2010   HDL 71.70 11/04/2010   LDLCALC 73 11/04/2010   TRIG 129 11/28/2013   Lab Results  Component Value Date   DDIMER 0.51* 11/22/2013    Radiology/Studies:  Dg Chest 2 View  05/05/2014   CLINICAL DATA:  Short of breath. COPD. History of smoking and hypertension.  EXAM: CHEST  2 VIEW  COMPARISON:  12/10/2013.  FINDINGS: Cardiac silhouette is normal in size. Aorta is mildly uncoiled. No mediastinal or hilar masses or evidence of adenopathy.  Lungs are hyperexpanded. Reticular opacity in the left lateral lung base is stable consistent with scarring. No lung consolidation or edema. No pleural effusion or pneumothorax. No discrete lung mass or nodule.  Bony thorax is demineralized but grossly intact.  IMPRESSION: 1. No acute cardiopulmonary disease. 2. Findings consistent with COPD.  No change from the prior exam.   Electronically Signed   By: Lajean Manes M.D.   On: 05/05/2014 19:40   Ct Angio Chest Pe W/cm &/or Wo Cm  05/12/2014   CLINICAL DATA:  Back pain between the shoulder blades. Evaluate for dissection. History of COPD.  EXAM: CT ANGIOGRAPHY CHEST AND ABDOMEN  TECHNIQUE: Multidetector CT imaging of the chest and abdomen was performed using the standard protocol during bolus administration of intravenous contrast. Multiplanar CT image reconstructions and MIPs were obtained to evaluate the vascular anatomy.  CONTRAST:  169mL OMNIPAQUE IOHEXOL 350 MG/ML SOLN  COMPARISON:  11/22/2013.  07/06/2011.  FINDINGS: CTA CHEST FINDINGS  There is no evidence of intramural hematoma.  Precontrast images demonstrate extensive atherosclerotic calcifications in the vasculature and 3 vessel coronary artery calcifications. There is profound mitral annular calcifications. Moderate degree of aortic valvular calcification.  There is no evidence of aortic dissection. Atherosclerotic plaque is noted. There is extensive calcification in the proximal left subclavian artery  Blooming artifact makes quantification of narrowing difficult.  There are no obvious filling defects in the pulmonary arterial tree to suggest acute pulmonary thromboembolism.  No abnormal mediastinal or hilar adenopathy.  No pneumothorax or pleural effusion  Moderate centrilobular emphysema. Linear atelectasis towards the lung bases.  Worsening T6 compression fracture with 30 % loss of height anteriorly. Slight retropulsion of the inferior endplate.  Review of the MIP images confirms the above findings.  CTA ABDOMEN FINDINGS  The aorta is non aneurysmal and patent. Diffuse moderate atherosclerotic calcifications of the aorta. There is some irregular calcified plaque in the infrarenal aorta till left and just below the left renal artery.  Celiac origin is calcified without obvious significant narrowing. Branch vessels are patent.  SMA is grossly patent with calcifications at its origin and branch vessels are grossly patent.  IMA origin is calcified. It is grossly patent. Branch vessels are grossly patent.  Single right renal artery and 3 left renal arteries are patent. Retro aortic left renal vein anatomy is present.  Right common iliac artery is patent. There is calcified plaque at the origin of the left common iliac artery making quantification of narrowing difficult.  Focal enhancement at the dome of the liver on image 74 is stable. Wedge-shaped enhancement at the inferior tip of the right lobe is also not significantly changed. The former probably represents a benign entity such is M angioma and lateral likely represents phase of  contrast or a vascular phenomenon.  Spleen, pancreas, gallbladder, adrenal glands are within normal limits. Kidneys are within normal limits.  Extensive diverticulosis throughout the colon is noted. Advanced degenerative changes throughout the lumbar spine. Lumbosacral junction is transitional.  Review of the MIP images confirms the above findings.  IMPRESSION: No evidence of intramural hematoma or aortic dissection. No acute vascular pathology is present on this study.  Worsening T6 compression deformity since the prior study dated November 22, 2013. MRI can be performed to further characterize.   Electronically Signed   By: Maryclare Bean M.D.   On: 05/12/2014 15:44   Ct Angio Abdomen W/cm &/or Wo Contrast  05/12/2014   CLINICAL DATA:  Back pain between the shoulder blades. Evaluate for dissection. History of COPD.  EXAM: CT ANGIOGRAPHY CHEST AND ABDOMEN  TECHNIQUE: Multidetector CT imaging of the chest and abdomen was performed using the standard protocol during bolus administration of intravenous contrast. Multiplanar CT image reconstructions and MIPs were obtained to evaluate the vascular anatomy.  CONTRAST:  139mL OMNIPAQUE IOHEXOL 350 MG/ML SOLN  COMPARISON:  11/22/2013.  07/06/2011.  FINDINGS: CTA CHEST FINDINGS  There is no evidence of intramural hematoma. Precontrast images demonstrate extensive atherosclerotic calcifications in the vasculature and 3 vessel coronary artery calcifications. There is profound mitral annular calcifications. Moderate degree of aortic valvular calcification.  There is no evidence of aortic dissection. Atherosclerotic plaque is noted. There is extensive calcification in the proximal left subclavian artery  Blooming artifact makes quantification of narrowing difficult.  There are no obvious filling defects in the pulmonary arterial tree to suggest acute pulmonary thromboembolism.  No abnormal mediastinal or hilar adenopathy.  No pneumothorax or pleural effusion  Moderate centrilobular  emphysema. Linear atelectasis towards the lung bases.  Worsening T6 compression fracture with 30 % loss of height anteriorly. Slight retropulsion of the inferior endplate.  Review of the MIP images confirms the above findings.  CTA  ABDOMEN FINDINGS  The aorta is non aneurysmal and patent. Diffuse moderate atherosclerotic calcifications of the aorta. There is some irregular calcified plaque in the infrarenal aorta till left and just below the left renal artery.  Celiac origin is calcified without obvious significant narrowing. Branch vessels are patent.  SMA is grossly patent with calcifications at its origin and branch vessels are grossly patent.  IMA origin is calcified. It is grossly patent. Branch vessels are grossly patent.  Single right renal artery and 3 left renal arteries are patent. Retro aortic left renal vein anatomy is present.  Right common iliac artery is patent. There is calcified plaque at the origin of the left common iliac artery making quantification of narrowing difficult.  Focal enhancement at the dome of the liver on image 74 is stable. Wedge-shaped enhancement at the inferior tip of the right lobe is also not significantly changed. The former probably represents a benign entity such is M angioma and lateral likely represents phase of contrast or a vascular phenomenon.  Spleen, pancreas, gallbladder, adrenal glands are within normal limits. Kidneys are within normal limits.  Extensive diverticulosis throughout the colon is noted. Advanced degenerative changes throughout the lumbar spine. Lumbosacral junction is transitional.  Review of the MIP images confirms the above findings.  IMPRESSION: No evidence of intramural hematoma or aortic dissection. No acute vascular pathology is present on this study.  Worsening T6 compression deformity since the prior study dated November 22, 2013. MRI can be performed to further characterize.   Electronically Signed   By: Maryclare Bean M.D.   On: 05/12/2014 15:44    Dg Chest Port 1 View  05/12/2014   CLINICAL DATA:  Chest and upper back pain with shortness of Breath  EXAM: PORTABLE CHEST - 1 VIEW  COMPARISON:  05/05/2014  FINDINGS: Cardiac shadow is stable at the upper limits of normal. The lungs are mildly hyperinflated consistent with COPD. Mild stable interstitial changes are seen. No focal infiltrate or sizable effusion is noted. No bony abnormality is noted. Diffuse aortic calcifications are again seen.  IMPRESSION: COPD without acute abnormality.   Electronically Signed   By: Inez Catalina M.D.   On: 05/12/2014 14:06   EKG: NSR 91bpm LBBB as previously noted  Physical Exam: Blood pressure 155/57, pulse 87, temperature 98.8 F (37.1 C), temperature source Oral, resp. rate 26, height 5\' 1"  (1.549 m), SpO2 95 %. General: Well developed, well nourished WM, in no acute distress. Head: Normocephalic, atraumatic, sclera non-icteric, no xanthomas, nares are without discharge.  Neck: JVD not elevated. Lungs: Diminished throughout but otherwise clear without wheezes, rales, or rhonchi. Breathing is unlabored. Heart: RRR with S1 S2. No rubs, or gallops appreciated. 3/6 honking early peaking systolic AS murmur Abdomen: Soft, non-tender, non-distended with normoactive bowel sounds. No hepatomegaly. No rebound/guarding. No obvious abdominal masses. Msk:  Strength and tone appear normal for age. Extremities: No clubbing or cyanosis. No edema.  Distal pedal pulses are 2+ and equal bilaterally. Neuro: Alert and oriented X 3. No facial asymmetry. No focal deficit. Moves all extremities spontaneously. Psych:  Responds to questions appropriately with a normal affect.   Assessment and Plan:   1. Back pain/chest pain, quite atypical 2. T6 compression deformity by imaging 3. No obstructive CAD by cath 11/2013 (mildly calcified) 4. COPD with intermittent hypoxia, on home O2 PRN 5. Mild AS by cath 11/2013, to be followed as outpatient 6. Chronic diastolic CHF, appears  euvolemic 7. Chronic intermittent LBBB  Symptoms do not sound  anginal in nature. The pain is acutely exacerbated by movement. There is no objective evidence of ischemia. She had no obstructive coronary disease by cath recently in 11/2013. CTA has effectively ruled out PE, dissection, and other cardiovascular anomaly. Her symptoms may be due to her T3 compression deformity. Do not anticipate any further cardiac workup necessary at present time. See below for additional thoughts.  Signed, Melina Copa PA-C 05/12/2014, 5:39 PM   I have seen and examined the patient along with Dayna Dunn PA-C.  I have reviewed the chart, notes and new data.  I agree with PA's note.  Key new complaints: pain is severe, pattern consistent with compressive neuralgia Key examination changes: early peaking AS murmur, otherwise normal cardiac exam Key new findings / data: CTA reviewed, normal coronaries by very recent angio, normal enzymes. LBBB is not new - comes and goes probably rate related.  PLAN: No plan for additional cardiac w/u at this time. Probably needs admission for uncontrolled pain. Low CV risk for surgical spine procedures, if these prove to be necessary.  Sanda Klein, MD, [place] [phone] 05/12/2014, 6:37 PM

## 2014-05-12 NOTE — Telephone Encounter (Signed)
Pt is headed to the hosp fyi

## 2014-05-12 NOTE — Telephone Encounter (Signed)
MR pt saw VS on 11/16.  Was given below recs: Chest xray today Medrol 2 mg pill >> 2 pills daily for 2 days, 1 pill daily for 2 days Follow up with Tammy Parrett as scheduled for January 2016  After finishing medrol, pt c/o worsening chest soreness and "chest spasms" explained as sharp pain and sudden SOB since Friday.  Having these episodes periodically through day up to 10 times daily.  Pt is unable to lay down as this worsens it.  Is requesting recs.  Pt is unsure if this is something to be handled by cardiology or [place].  MR please advise.  Thank you.

## 2014-05-12 NOTE — ED Notes (Signed)
Pt was assisted by relative to bedside commode when pt desat to 76%; O2 was reapplied at 2 liters and sat with patient until O2 Sat was above 90%; currently on 2 liters @ 94%

## 2014-05-12 NOTE — ED Provider Notes (Signed)
CSN: [id number]     Arrival date & time 05/12/14  1316 History   First MD Initiated Contact with Patient 05/12/14 1347     Chief Complaint  Patient presents with  . Chest Pain  . Shortness of Breath     (Consider location/radiation/quality/duration/timing/severity/associated sxs/prior Treatment) HPI  73 year old female presents today complaining of chest pain radiating to her back that began on Friday. She states in the initial episode began abruptly when she stood up after using the commode. The pain has been constant. It increases with any movement. She states the pain is severe and feels like it "takes her breath away". She has not had any fever, cough, nausea, or vomiting. She does have a history of COPD and states she has discussed her steroids but does not feel significantly short of breath.  Past Medical History  Diagnosis Date  . HIP PAIN   . HYPERTENSION   . Restless leg syndrome   . COPD (chronic obstructive pulmonary disease)     a. Multiple prior admissions for acute hypoxic respiratory failure in setting of COPD exacerbations +/- pneumonia.  . Peptic ulcer disease     a. 2013: gastric antrum.  Marland Kitchen Hyponatremia   . Tobacco abuse   . Hypomagnesemia   . LBBB (left bundle branch block)   . Pulmonary HTN     a. Elevated PA pressure by prior echoes.  . Mitral regurgitation     a. Mild MR by echo 2014, not mentioned on 11/2013 (severely calcified annulus).   Past Surgical History  Procedure Laterality Date  . Abdominal hysterectomy    . Dilation and curettage of uterus    . Tonsillectomy    . Esophagogastroduodenoscopy  07/09/2011    Procedure: ESOPHAGOGASTRODUODENOSCOPY (EGD);  Surgeon: Missy Sabins, MD;  Location: [facility];  Service: Endoscopy;  Laterality: N/A;  patient in room 1532  . Cataract extraction w/ intraocular lens  implant, bilateral Bilateral 03/2013    Patient claims it was within the month.    Family History  Problem Relation Age of Onset  . Heart  disease Sister   . Heart disease Brother   . Dementia Mother   . Anemia Father    History  Substance Use Topics  . Smoking status: Current Every Day Smoker -- 1.00 packs/day for 50 years    Types: Cigarettes  . Smokeless tobacco: Never Used     Comment: last cig was 11/22/13  . Alcohol Use: 0.5 oz/week    1 Not specified per week   OB History    No data available     Review of Systems  All other systems reviewed and are negative.     Allergies  Tetanus toxoid; Codeine; Erythromycin; Meloxicam; Methylprednisolone; Penicillins; and Prednisone  Home Medications   Prior to Admission medications   Medication Sig Start Date End Date Taking? Authorizing Provider  albuterol (PROVENTIL HFA;VENTOLIN HFA) 108 (90 BASE) MCG/ACT inhaler Inhale 2 puffs into the lungs every 4 (four) hours as needed for wheezing or shortness of breath. 06/24/13  Yes Laurey Morale, MD  aspirin 81 MG tablet Take 81 mg by mouth every morning.    Yes Historical Provider, MD  budesonide-formoterol (SYMBICORT) 80-4.5 MCG/ACT inhaler Inhale 2 puffs into the lungs 2 (two) times daily. 12/03/13  Yes Erick Colace, NP  clonazePAM (KLONOPIN) 0.5 MG tablet TAKE TWO TABLET BY MOUTH EVERY AT BEDTIME 01/13/14  Yes Laurey Morale, MD  diltiazem (CARDIZEM CD) 120 MG 24 hr capsule Take  1 capsule (120 mg total) by mouth daily. 12/03/13  Yes Erick Colace, NP  furosemide (LASIX) 20 MG tablet TAKE ONE TABLET BY MOUTH ONE TIME DAILY  05/05/14  Yes Laurey Morale, MD  guaiFENesin (MUCINEX) 600 MG 12 hr tablet Take 600 mg by mouth 2 (two) times daily.   Yes Historical Provider, MD  losartan (COZAAR) 50 MG tablet Take 1 tablet (50 mg total) by mouth daily. 03/06/14  Yes Fay Records, MD  ondansetron (ZOFRAN) 4 MG tablet Take 4 mg by mouth every 4 (four) hours as needed for nausea or vomiting.   Yes Historical Provider, MD  Potassium Chloride ER 20 MEQ TBCR Take 40 mEq by mouth 2 (two) times daily. 03/26/14  Yes Fay Records, MD  tiotropium  (SPIRIVA) 18 MCG inhalation capsule Place 1 capsule (18 mcg total) into inhaler and inhale daily. 12/03/13  Yes Erick Colace, NP  vitamin E 100 UNIT capsule Take 100 Units by mouth every morning.    Yes Historical Provider, MD  methylPREDNISolone (MEDROL) 2 MG tablet 2 pills daily for 2 days, 1 pill daily for 2 days Patient not taking: Reported on 05/12/2014 05/05/14   Chesley Mires, MD   BP 148/61 mmHg  Pulse 82  Temp(Src) 98.8 F (37.1 C) (Oral)  Resp 18  Ht 5\' 1"  (1.549 m)  SpO2 92% Physical Exam  Constitutional: She is oriented to person, place, and time. She appears well-developed.  HENT:  Head: Normocephalic and atraumatic.  Right Ear: External ear normal.  Left Ear: External ear normal.  Nose: Nose normal.  Mouth/Throat: Oropharynx is clear and moist.  Eyes: Conjunctivae and EOM are normal. Pupils are equal, round, and reactive to light.  Neck: Normal range of motion. Neck supple.  Cardiovascular: Normal rate, regular rhythm, normal heart sounds and intact distal pulses.   Pulmonary/Chest: Effort normal and breath sounds normal.  Abdominal: Soft. Bowel sounds are normal. There is tenderness.    Musculoskeletal: Normal range of motion.  Neurological: She is alert and oriented to person, place, and time.  Skin: Skin is warm and dry.  Psychiatric: She has a normal mood and affect. Her behavior is normal. Judgment normal.  Nursing note and vitals reviewed.   ED Course  Procedures (including critical care time) Labs Review Labs Reviewed  PRO B NATRIURETIC PEPTIDE - Abnormal; Notable for the following:    Pro B Natriuretic peptide (BNP) 577.4 (*)    All other components within normal limits  CBC WITH DIFFERENTIAL - Abnormal; Notable for the following:    WBC 12.8 (*)    Hemoglobin 15.2 (*)    Neutro Abs 8.5 (*)    All other components within normal limits  I-STAT CHEM 8, ED - Abnormal; Notable for the following:    Sodium 129 (*)    Potassium 5.9 (*)    Chloride 94  (*)    Glucose, Bld 102 (*)    Calcium, Ion 1.04 (*)    Hemoglobin 18.4 (*)    HCT 54.0 (*)    All other components within normal limits  COMPREHENSIVE METABOLIC PANEL  LIPASE, BLOOD  PROTIME-INR  COMPREHENSIVE METABOLIC PANEL  LIPASE, BLOOD  I-STAT TROPOININ, ED  I-STAT TROPOININ, ED    Imaging Review Ct Angio Chest Pe W/cm &/or Wo Cm  05/12/2014   CLINICAL DATA:  Back pain between the shoulder blades. Evaluate for dissection. History of COPD.  EXAM: CT ANGIOGRAPHY CHEST AND ABDOMEN  TECHNIQUE: Multidetector CT imaging of the  chest and abdomen was performed using the standard protocol during bolus administration of intravenous contrast. Multiplanar CT image reconstructions and MIPs were obtained to evaluate the vascular anatomy.  CONTRAST:  166mL OMNIPAQUE IOHEXOL 350 MG/ML SOLN  COMPARISON:  11/22/2013.  07/06/2011.  FINDINGS: CTA CHEST FINDINGS  There is no evidence of intramural hematoma. Precontrast images demonstrate extensive atherosclerotic calcifications in the vasculature and 3 vessel coronary artery calcifications. There is profound mitral annular calcifications. Moderate degree of aortic valvular calcification.  There is no evidence of aortic dissection. Atherosclerotic plaque is noted. There is extensive calcification in the proximal left subclavian artery  Blooming artifact makes quantification of narrowing difficult.  There are no obvious filling defects in the pulmonary arterial tree to suggest acute pulmonary thromboembolism.  No abnormal mediastinal or hilar adenopathy.  No pneumothorax or pleural effusion  Moderate centrilobular emphysema. Linear atelectasis towards the lung bases.  Worsening T6 compression fracture with 30 % loss of height anteriorly. Slight retropulsion of the inferior endplate.  Review of the MIP images confirms the above findings.  CTA ABDOMEN FINDINGS  The aorta is non aneurysmal and patent. Diffuse moderate atherosclerotic calcifications of the aorta. There  is some irregular calcified plaque in the infrarenal aorta till left and just below the left renal artery.  Celiac origin is calcified without obvious significant narrowing. Branch vessels are patent.  SMA is grossly patent with calcifications at its origin and branch vessels are grossly patent.  IMA origin is calcified. It is grossly patent. Branch vessels are grossly patent.  Single right renal artery and 3 left renal arteries are patent. Retro aortic left renal vein anatomy is present.  Right common iliac artery is patent. There is calcified plaque at the origin of the left common iliac artery making quantification of narrowing difficult.  Focal enhancement at the dome of the liver on image 74 is stable. Wedge-shaped enhancement at the inferior tip of the right lobe is also not significantly changed. The former probably represents a benign entity such is M angioma and lateral likely represents phase of contrast or a vascular phenomenon.  Spleen, pancreas, gallbladder, adrenal glands are within normal limits. Kidneys are within normal limits.  Extensive diverticulosis throughout the colon is noted. Advanced degenerative changes throughout the lumbar spine. Lumbosacral junction is transitional.  Review of the MIP images confirms the above findings.  IMPRESSION: No evidence of intramural hematoma or aortic dissection. No acute vascular pathology is present on this study.  Worsening T6 compression deformity since the prior study dated November 22, 2013. MRI can be performed to further characterize.   Electronically Signed   By: Maryclare Bean M.D.   On: 05/12/2014 15:44   Ct Angio Abdomen W/cm &/or Wo Contrast  05/12/2014   CLINICAL DATA:  Back pain between the shoulder blades. Evaluate for dissection. History of COPD.  EXAM: CT ANGIOGRAPHY CHEST AND ABDOMEN  TECHNIQUE: Multidetector CT imaging of the chest and abdomen was performed using the standard protocol during bolus administration of intravenous contrast.  Multiplanar CT image reconstructions and MIPs were obtained to evaluate the vascular anatomy.  CONTRAST:  152mL OMNIPAQUE IOHEXOL 350 MG/ML SOLN  COMPARISON:  11/22/2013.  07/06/2011.  FINDINGS: CTA CHEST FINDINGS  There is no evidence of intramural hematoma. Precontrast images demonstrate extensive atherosclerotic calcifications in the vasculature and 3 vessel coronary artery calcifications. There is profound mitral annular calcifications. Moderate degree of aortic valvular calcification.  There is no evidence of aortic dissection. Atherosclerotic plaque is noted. There is extensive calcification  in the proximal left subclavian artery  Blooming artifact makes quantification of narrowing difficult.  There are no obvious filling defects in the pulmonary arterial tree to suggest acute pulmonary thromboembolism.  No abnormal mediastinal or hilar adenopathy.  No pneumothorax or pleural effusion  Moderate centrilobular emphysema. Linear atelectasis towards the lung bases.  Worsening T6 compression fracture with 30 % loss of height anteriorly. Slight retropulsion of the inferior endplate.  Review of the MIP images confirms the above findings.  CTA ABDOMEN FINDINGS  The aorta is non aneurysmal and patent. Diffuse moderate atherosclerotic calcifications of the aorta. There is some irregular calcified plaque in the infrarenal aorta till left and just below the left renal artery.  Celiac origin is calcified without obvious significant narrowing. Branch vessels are patent.  SMA is grossly patent with calcifications at its origin and branch vessels are grossly patent.  IMA origin is calcified. It is grossly patent. Branch vessels are grossly patent.  Single right renal artery and 3 left renal arteries are patent. Retro aortic left renal vein anatomy is present.  Right common iliac artery is patent. There is calcified plaque at the origin of the left common iliac artery making quantification of narrowing difficult.  Focal  enhancement at the dome of the liver on image 74 is stable. Wedge-shaped enhancement at the inferior tip of the right lobe is also not significantly changed. The former probably represents a benign entity such is M angioma and lateral likely represents phase of contrast or a vascular phenomenon.  Spleen, pancreas, gallbladder, adrenal glands are within normal limits. Kidneys are within normal limits.  Extensive diverticulosis throughout the colon is noted. Advanced degenerative changes throughout the lumbar spine. Lumbosacral junction is transitional.  Review of the MIP images confirms the above findings.  IMPRESSION: No evidence of intramural hematoma or aortic dissection. No acute vascular pathology is present on this study.  Worsening T6 compression deformity since the prior study dated November 22, 2013. MRI can be performed to further characterize.   Electronically Signed   By: Maryclare Bean M.D.   On: 05/12/2014 15:44   Dg Chest Port 1 View  05/12/2014   CLINICAL DATA:  Chest and upper back pain with shortness of Breath  EXAM: PORTABLE CHEST - 1 VIEW  COMPARISON:  05/05/2014  FINDINGS: Cardiac shadow is stable at the upper limits of normal. The lungs are mildly hyperinflated consistent with COPD. Mild stable interstitial changes are seen. No focal infiltrate or sizable effusion is noted. No bony abnormality is noted. Diffuse aortic calcifications are again seen.  IMPRESSION: COPD without acute abnormality.   Electronically Signed   By: Inez Catalina M.D.   On: 05/12/2014 14:06     EKG Interpretation   Date/Time:  Monday May 12 2014 13:35:51 EST Ventricular Rate:  100 PR Interval:  152 QRS Duration: 147 QT Interval:  410 QTC Calculation: 529 R Axis:   -42 Text Interpretation:  Sinus tachycardia ** ** Consider ACUTE MI if LBBB is  new ** ** Confirmed by Ayub Kirsh MD, Andee Poles [id number]) on 05/12/2014 3:57:17 PM      MDM   Final diagnoses:  Chest pain    1- chest pain- patient with pain for several  days.  New lbbb seen on ekg with negative troponin.  Cardiol ogy being consulted.   [name] Mocha 12/02/2013, 9:32 AM 1. Mildly calcified coronary arteries with no obstructive CAD noted 2. Mild aortic stenosis 3. Preserved LV systolic function with estimated LVEF of 60% 4. Mild pulmonary  hypertension, otherwise normal intracardiac filling pressures   2- back pain- considered dissection given chest to back pain but no evidence on ct although patient with worsening t6 compression fracture cw. Pain with movement that patient is experiencing.  Patient with ttp over mid thoracic spine c.w. t6 compression seen on ct.   Discussed with Trish on for cardiology and they will see and evaluate.  Patient may require admission for further pain control of thoracic fracture regardless of cardiology assesment.  CAre discussed with Dr. Zenia Resides.     Shaune Pollack, MD 05/12/14 [phone]

## 2014-05-12 NOTE — ED Notes (Signed)
Patient complains of nausea.  Zofran order obtained from Dr. Jeanell Sparrow.  Zofran administered.  Patient remains painful with movement.

## 2014-05-12 NOTE — Telephone Encounter (Signed)
Will forward to MR as an FYI 

## 2014-05-12 NOTE — H&P (Addendum)
[facility] History and Physical  Jill White ASU:[id number] DOB: [birth year] DOA: 05/12/2014  Referring physician: Dr Zenia Resides - [facility] PCP: Laurey Morale, MD   Chief Complaint: Chest and back pain  HPI: Jill White is a 73 y.o. female  Chest pain started on Friday. Radiates to her back. Acute onset while stooling. Pain is constant and worsened w/ movement. Getting worse. Tramadol w/o benefit. Sharp in nature. Improves w/ rest. Associated w/ nausea.   Just fionished methylprednisone 4 day course  Tobacco a little less than 1ppd.   PRN O2 at home  Review of Systems:  Constitutional:  No weight loss, night sweats, Fevers, chills, fatigue.  HEENT:  No headaches, Difficulty swallowing,Tooth/dental problems,Sore throat,  No sneezing, itching, ear ache, nasal congestion, post nasal drip,  Cardio-vascular:  No Orthopnea, PND, swelling in lower extremities, anasarca, dizziness, palpitations  GI:  No heartburn, indigestion, abdominal pain, nausea, vomiting, diarrhea, change in bowel habits, loss of appetite  Resp:  No shortness of breath with exertion or at rest. No excess mucus, no productive cough, No non-productive cough, No coughing up of blood.No change in color of mucus.No wheezing.No chest wall deformity  Skin:  no rash or lesions.  GU:  no dysuria, change in color of urine, no urgency or frequency. No flank pain.  Musculoskeletal:  PER HPI Psych:  No change in mood or affect. No depression or anxiety. No memory loss.   Past Medical History  Diagnosis Date  . HIP PAIN   . HYPERTENSION   . Restless leg syndrome   . COPD (chronic obstructive pulmonary disease)     a. Multiple prior admissions for acute hypoxic respiratory failure in setting of COPD exacerbations +/- pneumonia.  . Peptic ulcer disease     a. 2013: gastric antrum.  Marland Kitchen Hyponatremia   . Tobacco abuse   . Hypomagnesemia   . LBBB (left bundle branch block)   . Pulmonary HTN     a. Elevated PA  pressure by prior echoes.  . Mitral regurgitation     a. Mild MR by echo 2014, not mentioned on 11/2013 (severely calcified annulus).  Marland Kitchen Respiratory failure     a. Adm 11/2013 with severe hypercarbic and hypoxemic respiratory failure requiring intubation.   . Abnormal echocardiogram     a. 11/2013: decreased EF 45-50%, moderate AS- > subsequent cath 12/02/13 with mildly calcified cors without obstruction, mild AS, EF 60%, mild pulm HTN.  Marland Kitchen Aortic stenosis     a. 11/2013: mod by echo, then mild by cath.  . Pulmonary HTN     a. 11/2013: mild by cath.  . Wide-complex tachycardia     a. Felt likely atrial tach with rate-related LBBB.  Marland Kitchen Chronic diastolic CHF (congestive heart failure)    Past Surgical History  Procedure Laterality Date  . Abdominal hysterectomy    . Dilation and curettage of uterus    . Tonsillectomy    . Esophagogastroduodenoscopy  07/09/2011    Procedure: ESOPHAGOGASTRODUODENOSCOPY (EGD);  Surgeon: Missy Sabins, MD;  Location: [facility];  Service: Endoscopy;  Laterality: N/A;  patient in room 1532  . Cataract extraction w/ intraocular lens  implant, bilateral Bilateral 03/2013    Patient claims it was within the month.    Social History:  reports that she has been smoking Cigarettes.  She has a 50 pack-year smoking history. She has never used smokeless tobacco. She reports that she drinks about 0.5 oz of alcohol per week. She reports that she does  not use illicit drugs.  Allergies  Allergen Reactions  . Tetanus Toxoid Anaphylaxis  . Codeine Nausea And Vomiting and Other (See Comments)    Itching and faint   . Erythromycin Nausea And Vomiting  . Meloxicam Itching  . Methylprednisolone Hives  . Penicillins Hives  . Prednisone Other (See Comments)    GI bleed    Family History  Problem Relation Age of Onset  . Heart disease Sister   . Heart disease Brother   . Dementia Mother   . Anemia Father      Prior to Admission medications   Medication Sig Start Date  End Date Taking? Authorizing Provider  albuterol (PROVENTIL HFA;VENTOLIN HFA) 108 (90 BASE) MCG/ACT inhaler Inhale 2 puffs into the lungs every 4 (four) hours as needed for wheezing or shortness of breath. 06/24/13  Yes Laurey Morale, MD  aspirin 81 MG tablet Take 81 mg by mouth every morning.    Yes Historical Provider, MD  budesonide-formoterol (SYMBICORT) 80-4.5 MCG/ACT inhaler Inhale 2 puffs into the lungs 2 (two) times daily. 12/03/13  Yes Erick Colace, NP  clonazePAM (KLONOPIN) 0.5 MG tablet TAKE TWO TABLET BY MOUTH EVERY AT BEDTIME 01/13/14  Yes Laurey Morale, MD  diltiazem (CARDIZEM CD) 120 MG 24 hr capsule Take 1 capsule (120 mg total) by mouth daily. 12/03/13  Yes Erick Colace, NP  furosemide (LASIX) 20 MG tablet TAKE ONE TABLET BY MOUTH ONE TIME DAILY  05/05/14  Yes Laurey Morale, MD  guaiFENesin (MUCINEX) 600 MG 12 hr tablet Take 600 mg by mouth 2 (two) times daily.   Yes Historical Provider, MD  losartan (COZAAR) 50 MG tablet Take 1 tablet (50 mg total) by mouth daily. 03/06/14  Yes Fay Records, MD  ondansetron (ZOFRAN) 4 MG tablet Take 4 mg by mouth every 4 (four) hours as needed for nausea or vomiting.   Yes Historical Provider, MD  Potassium Chloride ER 20 MEQ TBCR Take 40 mEq by mouth 2 (two) times daily. 03/26/14  Yes Fay Records, MD  tiotropium (SPIRIVA) 18 MCG inhalation capsule Place 1 capsule (18 mcg total) into inhaler and inhale daily. 12/03/13  Yes Erick Colace, NP  vitamin E 100 UNIT capsule Take 100 Units by mouth every morning.    Yes Historical Provider, MD  methylPREDNISolone (MEDROL) 2 MG tablet 2 pills daily for 2 days, 1 pill daily for 2 days Patient not taking: Reported on 05/12/2014 05/05/14   Chesley Mires, MD   Physical Exam: Filed Vitals:   05/12/14 1700 05/12/14 1730 05/12/14 1800 05/12/14 1801  BP: 135/106 123/69 133/62   Pulse: 88 87 81   Temp:      TempSrc:      Resp: 25 27 26    Height:      SpO2: 97% 95% 85% 97%    Wt Readings from Last 3  Encounters:  05/05/14 59.603 kg (131 lb 6.4 oz)  04/28/14 60.782 kg (134 lb)  03/14/14 57.153 kg (126 lb)    General: Appears to be in pain Eyes:  PERRL, normal lids, irises & conjunctiva ENT:  grossly normal hearing, lips & tongue Neck: nml ROM, masses or thyromegaly Cardiovascular: IV/VI pansystolic murmur, RRR, trace LE edema. Telemetry:  SR, no arrhythmias  Respiratory: CTA bilaterally, no w/r/r. Normal respiratory effort. On [place] O2 Abdomen:  soft, ntnd Skin:  no rash or induration seen on limited exam Musculoskeletal:  grossly normal tone BUE/BLE Psychiatric:  grossly normal mood and affect, speech  fluent and appropriate Neurologic:  grossly non-focal.          Labs on Admission:  Basic Metabolic Panel:  Recent Labs Lab 05/12/14 1344 05/12/14 1404 05/12/14 1446  NA HEMOLYZED SPECIMEN, RESULTS MAY BE AFFECTED 129* 131*  K HEMOLYZED SPECIMEN, RESULTS MAY BE AFFECTED 5.9* 4.0  CL HEMOLYZED SPECIMEN, RESULTS MAY BE AFFECTED 94* 90*  CO2 HEMOLYZED SPECIMEN, RESULTS MAY BE AFFECTED  --  29  GLUCOSE HEMOLYZED SPECIMEN, RESULTS MAY BE AFFECTED 102* 102*  BUN HEMOLYZED SPECIMEN, RESULTS MAY BE AFFECTED 11 9  CREATININE HEMOLYZED SPECIMEN, RESULTS MAY BE AFFECTED 0.50 0.47*  CALCIUM HEMOLYZED SPECIMEN, RESULTS MAY BE AFFECTED  --  9.6   Liver Function Tests:  Recent Labs Lab 05/12/14 1344 05/12/14 1446  AST HEMOLYZED SPECIMEN, RESULTS MAY BE AFFECTED 15  ALT HEMOLYZED SPECIMEN, RESULTS MAY BE AFFECTED 12  ALKPHOS HEMOLYZED SPECIMEN, RESULTS MAY BE AFFECTED 86  BILITOT HEMOLYZED SPECIMEN, RESULTS MAY BE AFFECTED 0.7  PROT HEMOLYZED SPECIMEN, RESULTS MAY BE AFFECTED 7.2  ALBUMIN HEMOLYZED SPECIMEN, RESULTS MAY BE AFFECTED 3.6    Recent Labs Lab 05/12/14 1344 05/12/14 1446  LIPASE 20 14   No results for input(s): AMMONIA in the last 168 hours. CBC:  Recent Labs Lab 05/12/14 1344 05/12/14 1404  WBC 12.8*  --   NEUTROABS 8.5*  --   HGB 15.2* 18.4*  HCT 43.9  54.0*  MCV 95.0  --   PLT 364  --    Cardiac Enzymes:  Recent Labs Lab 05/12/14 1623  TROPONINI <0.30    BNP (last 3 results)  Recent Labs  11/22/13 1145 03/26/14 1005 05/12/14 1316  PROBNP 4145.0* 94.0 577.4*   CBG: No results for input(s): GLUCAP in the last 168 hours.  Radiological Exams on Admission: Ct Angio Chest Pe W/cm &/or Wo Cm  05/12/2014   CLINICAL DATA:  Back pain between the shoulder blades. Evaluate for dissection. History of COPD.  EXAM: CT ANGIOGRAPHY CHEST AND ABDOMEN  TECHNIQUE: Multidetector CT imaging of the chest and abdomen was performed using the standard protocol during bolus administration of intravenous contrast. Multiplanar CT image reconstructions and MIPs were obtained to evaluate the vascular anatomy.  CONTRAST:  1103mL OMNIPAQUE IOHEXOL 350 MG/ML SOLN  COMPARISON:  11/22/2013.  07/06/2011.  FINDINGS: CTA CHEST FINDINGS  There is no evidence of intramural hematoma. Precontrast images demonstrate extensive atherosclerotic calcifications in the vasculature and 3 vessel coronary artery calcifications. There is profound mitral annular calcifications. Moderate degree of aortic valvular calcification.  There is no evidence of aortic dissection. Atherosclerotic plaque is noted. There is extensive calcification in the proximal left subclavian artery  Blooming artifact makes quantification of narrowing difficult.  There are no obvious filling defects in the pulmonary arterial tree to suggest acute pulmonary thromboembolism.  No abnormal mediastinal or hilar adenopathy.  No pneumothorax or pleural effusion  Moderate centrilobular emphysema. Linear atelectasis towards the lung bases.  Worsening T6 compression fracture with 30 % loss of height anteriorly. Slight retropulsion of the inferior endplate.  Review of the MIP images confirms the above findings.  CTA ABDOMEN FINDINGS  The aorta is non aneurysmal and patent. Diffuse moderate atherosclerotic calcifications of the  aorta. There is some irregular calcified plaque in the infrarenal aorta till left and just below the left renal artery.  Celiac origin is calcified without obvious significant narrowing. Branch vessels are patent.  SMA is grossly patent with calcifications at its origin and branch vessels are grossly patent.  IMA origin is calcified.  It is grossly patent. Branch vessels are grossly patent.  Single right renal artery and 3 left renal arteries are patent. Retro aortic left renal vein anatomy is present.  Right common iliac artery is patent. There is calcified plaque at the origin of the left common iliac artery making quantification of narrowing difficult.  Focal enhancement at the dome of the liver on image 74 is stable. Wedge-shaped enhancement at the inferior tip of the right lobe is also not significantly changed. The former probably represents a benign entity such is M angioma and lateral likely represents phase of contrast or a vascular phenomenon.  Spleen, pancreas, gallbladder, adrenal glands are within normal limits. Kidneys are within normal limits.  Extensive diverticulosis throughout the colon is noted. Advanced degenerative changes throughout the lumbar spine. Lumbosacral junction is transitional.  Review of the MIP images confirms the above findings.  IMPRESSION: No evidence of intramural hematoma or aortic dissection. No acute vascular pathology is present on this study.  Worsening T6 compression deformity since the prior study dated November 22, 2013. MRI can be performed to further characterize.   Electronically Signed   By: Maryclare Bean M.D.   On: 05/12/2014 15:44   Ct Angio Abdomen W/cm &/or Wo Contrast  05/12/2014   CLINICAL DATA:  Back pain between the shoulder blades. Evaluate for dissection. History of COPD.  EXAM: CT ANGIOGRAPHY CHEST AND ABDOMEN  TECHNIQUE: Multidetector CT imaging of the chest and abdomen was performed using the standard protocol during bolus administration of intravenous  contrast. Multiplanar CT image reconstructions and MIPs were obtained to evaluate the vascular anatomy.  CONTRAST:  130mL OMNIPAQUE IOHEXOL 350 MG/ML SOLN  COMPARISON:  11/22/2013.  07/06/2011.  FINDINGS: CTA CHEST FINDINGS  There is no evidence of intramural hematoma. Precontrast images demonstrate extensive atherosclerotic calcifications in the vasculature and 3 vessel coronary artery calcifications. There is profound mitral annular calcifications. Moderate degree of aortic valvular calcification.  There is no evidence of aortic dissection. Atherosclerotic plaque is noted. There is extensive calcification in the proximal left subclavian artery  Blooming artifact makes quantification of narrowing difficult.  There are no obvious filling defects in the pulmonary arterial tree to suggest acute pulmonary thromboembolism.  No abnormal mediastinal or hilar adenopathy.  No pneumothorax or pleural effusion  Moderate centrilobular emphysema. Linear atelectasis towards the lung bases.  Worsening T6 compression fracture with 30 % loss of height anteriorly. Slight retropulsion of the inferior endplate.  Review of the MIP images confirms the above findings.  CTA ABDOMEN FINDINGS  The aorta is non aneurysmal and patent. Diffuse moderate atherosclerotic calcifications of the aorta. There is some irregular calcified plaque in the infrarenal aorta till left and just below the left renal artery.  Celiac origin is calcified without obvious significant narrowing. Branch vessels are patent.  SMA is grossly patent with calcifications at its origin and branch vessels are grossly patent.  IMA origin is calcified. It is grossly patent. Branch vessels are grossly patent.  Single right renal artery and 3 left renal arteries are patent. Retro aortic left renal vein anatomy is present.  Right common iliac artery is patent. There is calcified plaque at the origin of the left common iliac artery making quantification of narrowing difficult.   Focal enhancement at the dome of the liver on image 74 is stable. Wedge-shaped enhancement at the inferior tip of the right lobe is also not significantly changed. The former probably represents a benign entity such is M angioma and lateral likely represents phase  of contrast or a vascular phenomenon.  Spleen, pancreas, gallbladder, adrenal glands are within normal limits. Kidneys are within normal limits.  Extensive diverticulosis throughout the colon is noted. Advanced degenerative changes throughout the lumbar spine. Lumbosacral junction is transitional.  Review of the MIP images confirms the above findings.  IMPRESSION: No evidence of intramural hematoma or aortic dissection. No acute vascular pathology is present on this study.  Worsening T6 compression deformity since the prior study dated November 22, 2013. MRI can be performed to further characterize.   Electronically Signed   By: Maryclare Bean M.D.   On: 05/12/2014 15:44   Dg Chest Port 1 View  05/12/2014   CLINICAL DATA:  Chest and upper back pain with shortness of Breath  EXAM: PORTABLE CHEST - 1 VIEW  COMPARISON:  05/05/2014  FINDINGS: Cardiac shadow is stable at the upper limits of normal. The lungs are mildly hyperinflated consistent with COPD. Mild stable interstitial changes are seen. No focal infiltrate or sizable effusion is noted. No bony abnormality is noted. Diffuse aortic calcifications are again seen.  IMPRESSION: COPD without acute abnormality.   Electronically Signed   By: Inez Catalina M.D.   On: 05/12/2014 14:06    EKG: Independently reviewed. NSR w/ LBBB. No sign of ischmia  Assessment/Plan Principal Problem:   Non-traumatic compression fracture of T6 thoracic vertebra Active Problems:   Essential hypertension   Tobacco abuse   COPD (chronic obstructive pulmonary disease)   Mild aortic stenosis   Back pain   Chest pain   Chronic diastolic CHF (congestive heart failure)   LBBB (left bundle branch block)  T6 Compression  Fracture:  30 % loss of height anteriorly w/ slight retropulsion of the inferior endplate. non traumatic. Causing significant pain. 2mg  Morphine Q2 w/o much relief. Pt on tramadol at home. Pt initially underwent evaluation by cardiology for concern for cardiac CP but signed off stating no workup necessary as this was related to her back. Troponin neg and EKG w/o changes.  - Admit - Fentanyl 47mcg x1 - Increase morphine to 4mg  Q2 (monitor Respiratory status closely) - Continuous pulse ox - Tylenol 1gm Q8 - Intranasal calcitonin - PT/OT - +/- Ortho consult in the am. Likely nothing to do as typically non-bace injury and typically no surgical intervention - outpt Dexa scan - zofran PRN  Diastolic/Systolic Heart failure: Last Echo 11/23/13 w/ EF 45-50%. No sign of exacerbation - continue home lasix, Kdur,  - continue ARB,   Hyponatremia: 131. Mild. Pt chronically low - NS 75cc ( consider DC in am given h/o CHF) - BMET in am  HTN: normotensive on admission - continue losartan, diltiazem  COPD: Just finished Medrol course at home. Respiratory status at baseline. On home O2 - O2 PRN - continue home symbicort, spiriva  Anxiety: baseline  - continue home Klonopin  Tobacco: continues to smoke and not interested in stopping. Somewhere between 1/2-1ppd - Nicotine patch   Code Status:FULL DVT Prophylaxis: Hep Family Communication: Grandaughter  Disposition Plan: pending improvement  Time spent: >70 min  [place] [email or web address] Password [email or web address]

## 2014-05-12 NOTE — ED Provider Notes (Signed)
Patient signed out to me by Dr. Jeanell Sparrow. Cardiology has seen the patient states that the left bundle branch block is old. She has been given multiple rounds of pain medication will be admitted for pain control for her thoracic spine compression fracture  Jill Jacobsen, MD 05/12/14 1843

## 2014-05-12 NOTE — Telephone Encounter (Signed)
LMOM for pt that MR is seeing patient's in the office and will get to this message as soon as possible.  If her symptoms are worsening she may need to go to nearest ED to be checked.  MR still advise on this.

## 2014-05-12 NOTE — ED Notes (Addendum)
Patient comes from home with substernal chest pain radiating to between her shoulders since Friday past.  Patient states the pain waxes and wanes and has gotten increasingly worse since Friday.  Pain is worse with movement and patient states the pain is severe enough to take her breath away.  +2 pulses radial and tibial palpated.  No pretibial edema noted.  Patient's lung sounds are diminished in the bases bilaterally with expiratory wheezing auscultated in remaining fields.  No S3/S4, but heart murmur is auscultated.  Patient's abdomen is tender to palpation, bowel sounds are present.  Patient is alert and oriented x4.  Patient rates pain 10/10 with movement.  Patient also has cough productive of clear sputum.

## 2014-05-12 NOTE — ED Notes (Signed)
MD Zenia Resides and Cardiology at bedside.Plan to admit patient for pain control and patient agrees.

## 2014-05-12 NOTE — Progress Notes (Signed)
Discussed admission status with Dr. Marily Memos.

## 2014-05-12 NOTE — ED Notes (Addendum)
Pt A+Ox4, reports c/o central CP and SOB x3 days.  Pt reports hx COPD and just finished steroids.  Speaking full sentences.  Pt appears uncomfortable.  Pain worse with movement and palpation.  Reports pain radiates to central back.  Skin PWD.  MAEI.

## 2014-05-12 NOTE — Telephone Encounter (Signed)
Pt waiting on recs.  Concerned that they haven't gotten a response yet.  Jill White

## 2014-05-13 DIAGNOSIS — I5043 Acute on chronic combined systolic (congestive) and diastolic (congestive) heart failure: Secondary | ICD-10-CM | POA: Diagnosis not present

## 2014-05-13 DIAGNOSIS — I447 Left bundle-branch block, unspecified: Secondary | ICD-10-CM | POA: Diagnosis present

## 2014-05-13 DIAGNOSIS — R079 Chest pain, unspecified: Secondary | ICD-10-CM | POA: Diagnosis present

## 2014-05-13 DIAGNOSIS — T483X5A Adverse effect of antitussives, initial encounter: Secondary | ICD-10-CM | POA: Diagnosis not present

## 2014-05-13 DIAGNOSIS — J449 Chronic obstructive pulmonary disease, unspecified: Secondary | ICD-10-CM | POA: Diagnosis present

## 2014-05-13 DIAGNOSIS — I5042 Chronic combined systolic (congestive) and diastolic (congestive) heart failure: Secondary | ICD-10-CM

## 2014-05-13 DIAGNOSIS — M4854XA Collapsed vertebra, not elsewhere classified, thoracic region, initial encounter for fracture: Secondary | ICD-10-CM | POA: Insufficient documentation

## 2014-05-13 DIAGNOSIS — J9622 Acute and chronic respiratory failure with hypercapnia: Secondary | ICD-10-CM | POA: Diagnosis not present

## 2014-05-13 DIAGNOSIS — I350 Nonrheumatic aortic (valve) stenosis: Secondary | ICD-10-CM | POA: Diagnosis present

## 2014-05-13 DIAGNOSIS — G2581 Restless legs syndrome: Secondary | ICD-10-CM | POA: Diagnosis present

## 2014-05-13 DIAGNOSIS — J9621 Acute and chronic respiratory failure with hypoxia: Secondary | ICD-10-CM | POA: Diagnosis not present

## 2014-05-13 DIAGNOSIS — E871 Hypo-osmolality and hyponatremia: Secondary | ICD-10-CM | POA: Diagnosis present

## 2014-05-13 DIAGNOSIS — L299 Pruritus, unspecified: Secondary | ICD-10-CM | POA: Diagnosis not present

## 2014-05-13 DIAGNOSIS — K567 Ileus, unspecified: Secondary | ICD-10-CM | POA: Diagnosis not present

## 2014-05-13 DIAGNOSIS — Z9981 Dependence on supplemental oxygen: Secondary | ICD-10-CM | POA: Diagnosis not present

## 2014-05-13 DIAGNOSIS — F419 Anxiety disorder, unspecified: Secondary | ICD-10-CM | POA: Diagnosis present

## 2014-05-13 DIAGNOSIS — Z8711 Personal history of peptic ulcer disease: Secondary | ICD-10-CM | POA: Diagnosis not present

## 2014-05-13 DIAGNOSIS — Z7982 Long term (current) use of aspirin: Secondary | ICD-10-CM | POA: Diagnosis not present

## 2014-05-13 DIAGNOSIS — J690 Pneumonitis due to inhalation of food and vomit: Secondary | ICD-10-CM | POA: Diagnosis not present

## 2014-05-13 DIAGNOSIS — F17210 Nicotine dependence, cigarettes, uncomplicated: Secondary | ICD-10-CM | POA: Diagnosis present

## 2014-05-13 DIAGNOSIS — I10 Essential (primary) hypertension: Secondary | ICD-10-CM | POA: Diagnosis present

## 2014-05-13 DIAGNOSIS — Z79899 Other long term (current) drug therapy: Secondary | ICD-10-CM | POA: Diagnosis not present

## 2014-05-13 DIAGNOSIS — Z7951 Long term (current) use of inhaled steroids: Secondary | ICD-10-CM | POA: Diagnosis not present

## 2014-05-13 DIAGNOSIS — Z515 Encounter for palliative care: Secondary | ICD-10-CM | POA: Diagnosis not present

## 2014-05-13 LAB — COMPREHENSIVE METABOLIC PANEL
ALBUMIN: 2.5 g/dL — AB (ref 3.5–5.2)
ALK PHOS: 63 U/L (ref 39–117)
ALT: 9 U/L (ref 0–35)
ANION GAP: 9 (ref 5–15)
AST: 12 U/L (ref 0–37)
BILIRUBIN TOTAL: 0.3 mg/dL (ref 0.3–1.2)
BUN: 15 mg/dL (ref 6–23)
CHLORIDE: 105 meq/L (ref 96–112)
CO2: 26 mEq/L (ref 19–32)
Calcium: 6.9 mg/dL — ABNORMAL LOW (ref 8.4–10.5)
Creatinine, Ser: 0.67 mg/dL (ref 0.50–1.10)
GFR calc Af Amer: >90 mL/min (ref >90)
GFR, EST NON AFRICAN AMERICAN: 84 mL/min — AB (ref >90)
Glucose, Bld: 119 mg/dL — ABNORMAL HIGH (ref 70–99)
POTASSIUM: 3.6 meq/L — AB (ref 3.7–5.3)
Sodium: 140 mEq/L (ref 137–147)
Total Protein: 5.2 g/dL — ABNORMAL LOW (ref 6.0–8.3)

## 2014-05-13 LAB — CBC
HEMATOCRIT: 34.8 % — AB (ref 36.0–46.0)
Hemoglobin: 11.5 g/dL — ABNORMAL LOW (ref 12.0–15.0)
MCH: 32.8 pg (ref 26.0–34.0)
MCHC: 33 g/dL (ref 30.0–36.0)
MCV: 99.1 fL (ref 78.0–100.0)
PLATELETS: 270 10*3/uL (ref 150–400)
RBC: 3.51 MIL/uL — ABNORMAL LOW (ref 3.87–5.11)
RDW: 13.2 % (ref 11.5–15.5)
WBC: 6.6 10*3/uL (ref 4.0–10.5)

## 2014-05-13 LAB — MAGNESIUM: Magnesium: 1.4 mg/dL — ABNORMAL LOW (ref 1.5–2.5)

## 2014-05-13 MED ORDER — ONDANSETRON HCL 4 MG/2ML IJ SOLN
4.0000 mg | Freq: Four times a day (QID) | INTRAMUSCULAR | Status: DC | PRN
Start: 2014-05-13 — End: 2014-05-22
  Administered 2014-05-13 – 2014-05-17 (×8): 4 mg via INTRAVENOUS
  Filled 2014-05-13 (×8): qty 2

## 2014-05-13 MED ORDER — CALCIUM CARBONATE-VITAMIN D 500-200 MG-UNIT PO TABS
1.0000 | ORAL_TABLET | Freq: Three times a day (TID) | ORAL | Status: DC
  Administered 2014-05-13 – 2014-05-14 (×3): 1 via ORAL
  Filled 2014-05-13 (×6): qty 1

## 2014-05-13 MED ORDER — MORPHINE SULFATE 15 MG PO TABS
15.0000 mg | ORAL_TABLET | ORAL | Status: DC | PRN
  Filled 2014-05-13: qty 1

## 2014-05-13 MED ORDER — MAGNESIUM SULFATE 4 GM/100ML IV SOLN
4.0000 g | Freq: Once | INTRAVENOUS | Status: AC
  Administered 2014-05-13: 4 g via INTRAVENOUS
  Filled 2014-05-13: qty 100

## 2014-05-13 MED ORDER — ONDANSETRON HCL 4 MG/2ML IJ SOLN
INTRAMUSCULAR | Status: AC
  Administered 2014-05-13: 4 mg via INTRAVENOUS
  Filled 2014-05-13: qty 2

## 2014-05-13 MED ORDER — ONDANSETRON HCL 4 MG PO TABS
4.0000 mg | ORAL_TABLET | Freq: Four times a day (QID) | ORAL | Status: DC | PRN
  Administered 2014-05-18 – 2014-05-19 (×2): 4 mg via ORAL
  Filled 2014-05-13 (×2): qty 1

## 2014-05-13 MED ORDER — MORPHINE SULFATE ER 30 MG PO TBCR
30.0000 mg | EXTENDED_RELEASE_TABLET | Freq: Two times a day (BID) | ORAL | Status: DC
  Administered 2014-05-13 (×2): 30 mg via ORAL
  Filled 2014-05-13 (×2): qty 1

## 2014-05-13 NOTE — Plan of Care (Signed)
Problem: Phase I Progression Outcomes Goal: OOB as tolerated unless otherwise ordered Outcome: Completed/Met Date Met:  05/13/14 Goal: Voiding-avoid urinary catheter unless indicated Outcome: Completed/Met Date Met:  05/13/14 Goal: Hemodynamically stable Outcome: Completed/Met Date Met:  05/13/14 Goal: Other Phase I Outcomes/Goals Outcome: Completed/Met Date Met:  05/13/14

## 2014-05-13 NOTE — Progress Notes (Signed)
Pt is strongly refusing bed alarm, educated pt on why it was important, but pt still refusing. Will continue to monitor.

## 2014-05-13 NOTE — Plan of Care (Signed)
Problem: Phase II Progression Outcomes Goal: Progress activity as tolerated unless otherwise ordered Outcome: Not Progressing Goal: Vital signs remain stable Outcome: Progressing Goal: IV changed to normal saline lock Outcome: Completed/Met Date Met:  05/13/14 Goal: Obtain order to discontinue catheter if appropriate Outcome: Not Applicable Date Met:  21/19/41 Goal: Other Phase II Outcomes/Goals Outcome: Completed/Met Date Met:  05/13/14

## 2014-05-13 NOTE — Progress Notes (Signed)
PT Cancellation Note  Patient Details Name: Jill White MRN: [id number] DOB: 04/14/1941   Cancelled Treatment:    Reason Eval/Treat Not Completed: Pain limiting ability to participate. Pt stated that she can walk but movement causes severe pain, she declined PT. Dr. Grandville Silos aware and stated he will order IR consult. Will follow.    [name] 05/13/2014, 11:11 AM  [phone]

## 2014-05-13 NOTE — Progress Notes (Signed)
OT Cancellation Note  Patient Details Name: CHASYA ZENZ MRN: [id number] DOB: 01-14-1941   Cancelled Treatment:    Reason Eval/Treat Not Completed: Other (comment).  Noted PT's cancel note. Will check back after IR procedure or pain better controlled.  Khadim Lundberg 05/13/2014, 3:07 PM  Lesle Chris, OTR/L [phone] 05/13/2014

## 2014-05-13 NOTE — Progress Notes (Signed)
[facility] PROGRESS NOTE  Jill White UTM:[id number] DOB: Apr 07, 1941 DOA: 05/12/2014 PCP: Laurey Morale, MD  Assessment/Plan: #1 worsening T6 compression fracture Unknown etiology. ?? Osteoporosis .Patient with complaints of significant pain. Will place patient on MS Contin 30 mg twice daily. MSIR 15 mg when necessary breakthrough pain. Patient will likely need conservative treatment for about 2 weeks with close follow-up with neurosurgery as outpatient for further evaluation. Continue calcitonin nasal spray, Os-Cal with vitamin D. PT/OT. Will consult with neurosurgery for further evaluation. Likely needs outpatient DEXA scan. Follow.  #2 COPD/ongoing tobacco abuse Patient states she is not ready to quit tobacco abuse. Nicotine patch. Continue Symbicort, Spiriva.   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 Compensated. Continue home regimen of Lasix, aspirin, diltiazem, Lasix, Cozaar.  #4 hypertension Continue Cozaar, diltiazem, Lasix.  #5 hyponatremia Resolved. NSL IV fluids.  #6 anxiety Continue home regimen Klonopin.  #7 prophylaxis Heparin for DVT prophylaxis.  Code Status: Full Family Communication: Updated patient and daughter at bedside. Disposition Plan: Pending evaluation by neurosurgery and physical therapy. Likely home with home health therapies when pain is controlled.   Consultants:  Neurosurgery pending  Procedures:  Chest x-ray 05/12/2014  CT chest CT abdomen 05/12/2014  Antibiotics:  None  HPI/Subjective: Patient complaining of significant back pain. Patient demanding to be seen by neurosurgeon for further evaluation.  Objective: Filed Vitals:   05/13/14 0543  BP: 120/47  Pulse: 86  Temp: 97.6 F (36.4 C)  Resp: 19    Intake/Output Summary (Last 24 hours) at 05/13/14 1129 Last data filed at 05/13/14 0700  Gross per 24 hour  Intake 1264.25 ml  Output      0 ml  Net 1264.25 ml    Filed Weights   05/12/14 2016  Weight: 59.24 kg (130 lb 9.6 oz)    Exam:   General: NAD  Cardiovascular: RRR  Respiratory: CTAB  Abdomen: Soft, nontender, nondistended, positive bowel sounds.  Musculoskeletal: No clubbing cyanosis or edema.  Data Reviewed: Basic Metabolic Panel:  Recent Labs Lab 05/12/14 1344 05/12/14 1404 05/12/14 1446 05/13/14 0445  NA HEMOLYZED SPECIMEN, RESULTS MAY BE AFFECTED 129* 131* 140  K HEMOLYZED SPECIMEN, RESULTS MAY BE AFFECTED 5.9* 4.0 3.6*  CL HEMOLYZED SPECIMEN, RESULTS MAY BE AFFECTED 94* 90* 105  CO2 HEMOLYZED SPECIMEN, RESULTS MAY BE AFFECTED  --  29 26  GLUCOSE HEMOLYZED SPECIMEN, RESULTS MAY BE AFFECTED 102* 102* 119*  BUN HEMOLYZED SPECIMEN, RESULTS MAY BE AFFECTED 11 9 15   CREATININE HEMOLYZED SPECIMEN, RESULTS MAY BE AFFECTED 0.50 0.47* 0.67  CALCIUM HEMOLYZED SPECIMEN, RESULTS MAY BE AFFECTED  --  9.6 6.9*  MG  --   --   --  1.4*   Liver Function Tests:  Recent Labs Lab 05/12/14 1344 05/12/14 1446 05/13/14 0445  AST HEMOLYZED SPECIMEN, RESULTS MAY BE AFFECTED 15 12  ALT HEMOLYZED SPECIMEN, RESULTS MAY BE AFFECTED 12 9  ALKPHOS HEMOLYZED SPECIMEN, RESULTS MAY BE AFFECTED 86 63  BILITOT HEMOLYZED SPECIMEN, RESULTS MAY BE AFFECTED 0.7 0.3  PROT HEMOLYZED SPECIMEN, RESULTS MAY BE AFFECTED 7.2 5.2*  ALBUMIN HEMOLYZED SPECIMEN, RESULTS MAY BE AFFECTED 3.6 2.5*    Recent Labs Lab 05/12/14 1344 05/12/14 1446  LIPASE 20 14   No results for input(s): AMMONIA in the last 168 hours. CBC:  Recent Labs Lab 05/12/14 1344 05/12/14 1404 05/13/14 0445  WBC 12.8*  --  6.6  NEUTROABS 8.5*  --   --   HGB 15.2* 18.4* 11.5*  HCT  43.9 54.0* 34.8*  MCV 95.0  --  99.1  PLT 364  --  270   Cardiac Enzymes:  Recent Labs Lab 05/12/14 1623  TROPONINI <0.30   BNP (last 3 results)  Recent Labs  11/22/13 1145 03/26/14 1005 05/12/14 1316  PROBNP 4145.0* 94.0 577.4*   CBG: No results for input(s): GLUCAP in the last  168 hours.  No results found for this or any previous visit (from the past 240 hour(s)).   Studies: Ct Angio Chest Pe W/cm &/or Wo Cm  05/12/2014   CLINICAL DATA:  Back pain between the shoulder blades. Evaluate for dissection. History of COPD.  EXAM: CT ANGIOGRAPHY CHEST AND ABDOMEN  TECHNIQUE: Multidetector CT imaging of the chest and abdomen was performed using the standard protocol during bolus administration of intravenous contrast. Multiplanar CT image reconstructions and MIPs were obtained to evaluate the vascular anatomy.  CONTRAST:  163mL OMNIPAQUE IOHEXOL 350 MG/ML SOLN  COMPARISON:  11/22/2013.  07/06/2011.  FINDINGS: CTA CHEST FINDINGS  There is no evidence of intramural hematoma. Precontrast images demonstrate extensive atherosclerotic calcifications in the vasculature and 3 vessel coronary artery calcifications. There is profound mitral annular calcifications. Moderate degree of aortic valvular calcification.  There is no evidence of aortic dissection. Atherosclerotic plaque is noted. There is extensive calcification in the proximal left subclavian artery  Blooming artifact makes quantification of narrowing difficult.  There are no obvious filling defects in the pulmonary arterial tree to suggest acute pulmonary thromboembolism.  No abnormal mediastinal or hilar adenopathy.  No pneumothorax or pleural effusion  Moderate centrilobular emphysema. Linear atelectasis towards the lung bases.  Worsening T6 compression fracture with 30 % loss of height anteriorly. Slight retropulsion of the inferior endplate.  Review of the MIP images confirms the above findings.  CTA ABDOMEN FINDINGS  The aorta is non aneurysmal and patent. Diffuse moderate atherosclerotic calcifications of the aorta. There is some irregular calcified plaque in the infrarenal aorta till left and just below the left renal artery.  Celiac origin is calcified without obvious significant narrowing. Branch vessels are patent.  SMA is  grossly patent with calcifications at its origin and branch vessels are grossly patent.  IMA origin is calcified. It is grossly patent. Branch vessels are grossly patent.  Single right renal artery and 3 left renal arteries are patent. Retro aortic left renal vein anatomy is present.  Right common iliac artery is patent. There is calcified plaque at the origin of the left common iliac artery making quantification of narrowing difficult.  Focal enhancement at the dome of the liver on image 74 is stable. Wedge-shaped enhancement at the inferior tip of the right lobe is also not significantly changed. The former probably represents a benign entity such is M angioma and lateral likely represents phase of contrast or a vascular phenomenon.  Spleen, pancreas, gallbladder, adrenal glands are within normal limits. Kidneys are within normal limits.  Extensive diverticulosis throughout the colon is noted. Advanced degenerative changes throughout the lumbar spine. Lumbosacral junction is transitional.  Review of the MIP images confirms the above findings.  IMPRESSION: No evidence of intramural hematoma or aortic dissection. No acute vascular pathology is present on this study.  Worsening T6 compression deformity since the prior study dated November 22, 2013. MRI can be performed to further characterize.   Electronically Signed   By: Maryclare Bean M.D.   On: 05/12/2014 15:44   Ct Angio Abdomen W/cm &/or Wo Contrast  05/12/2014   CLINICAL DATA:  Back pain  between the shoulder blades. Evaluate for dissection. History of COPD.  EXAM: CT ANGIOGRAPHY CHEST AND ABDOMEN  TECHNIQUE: Multidetector CT imaging of the chest and abdomen was performed using the standard protocol during bolus administration of intravenous contrast. Multiplanar CT image reconstructions and MIPs were obtained to evaluate the vascular anatomy.  CONTRAST:  127mL OMNIPAQUE IOHEXOL 350 MG/ML SOLN  COMPARISON:  11/22/2013.  07/06/2011.  FINDINGS: CTA CHEST FINDINGS   There is no evidence of intramural hematoma. Precontrast images demonstrate extensive atherosclerotic calcifications in the vasculature and 3 vessel coronary artery calcifications. There is profound mitral annular calcifications. Moderate degree of aortic valvular calcification.  There is no evidence of aortic dissection. Atherosclerotic plaque is noted. There is extensive calcification in the proximal left subclavian artery  Blooming artifact makes quantification of narrowing difficult.  There are no obvious filling defects in the pulmonary arterial tree to suggest acute pulmonary thromboembolism.  No abnormal mediastinal or hilar adenopathy.  No pneumothorax or pleural effusion  Moderate centrilobular emphysema. Linear atelectasis towards the lung bases.  Worsening T6 compression fracture with 30 % loss of height anteriorly. Slight retropulsion of the inferior endplate.  Review of the MIP images confirms the above findings.  CTA ABDOMEN FINDINGS  The aorta is non aneurysmal and patent. Diffuse moderate atherosclerotic calcifications of the aorta. There is some irregular calcified plaque in the infrarenal aorta till left and just below the left renal artery.  Celiac origin is calcified without obvious significant narrowing. Branch vessels are patent.  SMA is grossly patent with calcifications at its origin and branch vessels are grossly patent.  IMA origin is calcified. It is grossly patent. Branch vessels are grossly patent.  Single right renal artery and 3 left renal arteries are patent. Retro aortic left renal vein anatomy is present.  Right common iliac artery is patent. There is calcified plaque at the origin of the left common iliac artery making quantification of narrowing difficult.  Focal enhancement at the dome of the liver on image 74 is stable. Wedge-shaped enhancement at the inferior tip of the right lobe is also not significantly changed. The former probably represents a benign entity such is M  angioma and lateral likely represents phase of contrast or a vascular phenomenon.  Spleen, pancreas, gallbladder, adrenal glands are within normal limits. Kidneys are within normal limits.  Extensive diverticulosis throughout the colon is noted. Advanced degenerative changes throughout the lumbar spine. Lumbosacral junction is transitional.  Review of the MIP images confirms the above findings.  IMPRESSION: No evidence of intramural hematoma or aortic dissection. No acute vascular pathology is present on this study.  Worsening T6 compression deformity since the prior study dated November 22, 2013. MRI can be performed to further characterize.   Electronically Signed   By: Maryclare Bean M.D.   On: 05/12/2014 15:44   Dg Chest Port 1 View  05/12/2014   CLINICAL DATA:  Chest and upper back pain with shortness of Breath  EXAM: PORTABLE CHEST - 1 VIEW  COMPARISON:  05/05/2014  FINDINGS: Cardiac shadow is stable at the upper limits of normal. The lungs are mildly hyperinflated consistent with COPD. Mild stable interstitial changes are seen. No focal infiltrate or sizable effusion is noted. No bony abnormality is noted. Diffuse aortic calcifications are again seen.  IMPRESSION: COPD without acute abnormality.   Electronically Signed   By: Inez Catalina M.D.   On: 05/12/2014 14:06    Scheduled Meds: . acetaminophen  1,000 mg Oral Q8H  . antiseptic oral  rinse  7 mL Mouth Rinse BID  . aspirin  81 mg Oral q morning - 10a  . budesonide-formoterol  2 puff Inhalation BID  . calcitonin (salmon)  1 spray Alternating Nares Daily  . calcium-vitamin D  1 tablet Oral TID  . clonazePAM  0.5 mg Oral QHS  . diltiazem  120 mg Oral Daily  . furosemide  20 mg Oral Daily  . heparin  5,000 Units Subcutaneous 3 times per day  . losartan  50 mg Oral Daily  . magnesium sulfate 1 - 4 g bolus IVPB  4 g Intravenous Once  . morphine  30 mg Oral Q12H  . nicotine  14 mg Transdermal Daily  . potassium chloride SA  40 mEq Oral BID  . senna  1  tablet Oral BID  . sodium chloride  3 mL Intravenous Q12H  . tiotropium  18 mcg Inhalation Daily   Continuous Infusions: . sodium chloride 75 mL/hr at 05/12/14 2035    Principal Problem:   Non-traumatic compression fracture of T6 thoracic vertebra Active Problems:   Essential hypertension   Tobacco abuse   COPD (chronic obstructive pulmonary disease)   Mild aortic stenosis   Back pain   Chest pain   LBBB (left bundle branch block)   Thoracic spine fracture   Combined systolic and diastolic heart failure    Time spent: 35 mins    [facility] MD [facility] Pager [phone]. If 7PM-7AM, please contact night-coverage at [email or web address], password [facility] 05/13/2014, 11:29 AM  LOS: 1 day

## 2014-05-13 NOTE — Progress Notes (Signed)
UR completed 

## 2014-05-14 ENCOUNTER — Inpatient Hospital Stay (HOSPITAL_COMMUNITY): Payer: Medicare Other

## 2014-05-14 DIAGNOSIS — J189 Pneumonia, unspecified organism: Secondary | ICD-10-CM

## 2014-05-14 DIAGNOSIS — I5041 Acute combined systolic (congestive) and diastolic (congestive) heart failure: Secondary | ICD-10-CM

## 2014-05-14 DIAGNOSIS — J9620 Acute and chronic respiratory failure, unspecified whether with hypoxia or hypercapnia: Secondary | ICD-10-CM

## 2014-05-14 LAB — CBC WITH DIFFERENTIAL/PLATELET
BASOS ABS: 0 10*3/uL (ref 0.0–0.1)
Basophils Relative: 0 % (ref 0–1)
Eosinophils Absolute: 0 10*3/uL (ref 0.0–0.7)
Eosinophils Relative: 0 % (ref 0–5)
HCT: 37.7 % (ref 36.0–46.0)
Hemoglobin: 12.1 g/dL (ref 12.0–15.0)
LYMPHS PCT: 8 % — AB (ref 12–46)
Lymphs Abs: 1.1 10*3/uL (ref 0.7–4.0)
MCH: 32.2 pg (ref 26.0–34.0)
MCHC: 32.1 g/dL (ref 30.0–36.0)
MCV: 100.3 fL — ABNORMAL HIGH (ref 78.0–100.0)
Monocytes Absolute: 1.1 10*3/uL — ABNORMAL HIGH (ref 0.1–1.0)
Monocytes Relative: 8 % (ref 3–12)
Neutro Abs: 11.6 10*3/uL — ABNORMAL HIGH (ref 1.7–7.7)
Neutrophils Relative %: 84 % — ABNORMAL HIGH (ref 43–77)
PLATELETS: 290 10*3/uL (ref 150–400)
RBC: 3.76 MIL/uL — ABNORMAL LOW (ref 3.87–5.11)
RDW: 12.9 % (ref 11.5–15.5)
WBC: 13.8 10*3/uL — AB (ref 4.0–10.5)

## 2014-05-14 LAB — PRO B NATRIURETIC PEPTIDE: Pro B Natriuretic peptide (BNP): 1351 pg/mL — ABNORMAL HIGH (ref 0–450)

## 2014-05-14 LAB — COMPREHENSIVE METABOLIC PANEL
ALK PHOS: 77 U/L (ref 39–117)
ALT: 10 U/L (ref 0–35)
AST: 15 U/L (ref 0–37)
Albumin: 3.2 g/dL — ABNORMAL LOW (ref 3.5–5.2)
Anion gap: 10 (ref 5–15)
BUN: 11 mg/dL (ref 6–23)
CALCIUM: 9.3 mg/dL (ref 8.4–10.5)
CHLORIDE: 90 meq/L — AB (ref 96–112)
CO2: 31 meq/L (ref 19–32)
Creatinine, Ser: 0.53 mg/dL (ref 0.50–1.10)
GFR calc Af Amer: >90 mL/min (ref >90)
Glucose, Bld: 128 mg/dL — ABNORMAL HIGH (ref 70–99)
POTASSIUM: 5 meq/L (ref 3.7–5.3)
SODIUM: 131 meq/L — AB (ref 137–147)
Total Bilirubin: 0.3 mg/dL (ref 0.3–1.2)
Total Protein: 7 g/dL (ref 6.0–8.3)

## 2014-05-14 LAB — BLOOD GAS, ARTERIAL
ACID-BASE EXCESS: 2.7 mmol/L — AB (ref 0.0–2.0)
Acid-Base Excess: 0.3 mmol/L (ref 0.0–2.0)
Bicarbonate: 29.7 mEq/L — ABNORMAL HIGH (ref 20.0–24.0)
Bicarbonate: 30.8 mEq/L — ABNORMAL HIGH (ref 20.0–24.0)
Delivery systems: POSITIVE
Drawn by: 308601
Drawn by: 318141
Expiratory PAP: 5
FIO2: 1 %
FIO2: 0.5 %
Inspiratory PAP: 10
LHR: 8 {breaths}/min
MODE: POSITIVE
O2 Saturation: 97.7 %
O2 Saturation: 89.7 %
PATIENT TEMPERATURE: 98.4
Patient temperature: 98.6
TCO2: 27.7 mmol/L (ref 0–100)
TCO2: 28.5 mmol/L (ref 0–100)
pCO2 arterial: 75.2 mmHg (ref 35.0–45.0)
pCO2 arterial: 67.9 mmHg (ref 35.0–45.0)
pH, Arterial: 7.222 — ABNORMAL LOW (ref 7.350–7.450)
pH, Arterial: 7.279 — ABNORMAL LOW (ref 7.350–7.450)
pO2, Arterial: 110 mmHg — ABNORMAL HIGH (ref 80.0–100.0)
pO2, Arterial: 61.5 mmHg — ABNORMAL LOW (ref 80.0–100.0)

## 2014-05-14 LAB — MRSA PCR SCREENING: MRSA by PCR: NEGATIVE

## 2014-05-14 LAB — LACTIC ACID, PLASMA: Lactic Acid, Venous: 0.8 mmol/L (ref 0.5–2.2)

## 2014-05-14 LAB — TROPONIN I: Troponin I: <0.3 ng/mL (ref <0.30)

## 2014-05-14 LAB — STREP PNEUMONIAE URINARY ANTIGEN: Strep Pneumo Urinary Antigen: NEGATIVE

## 2014-05-14 MED ORDER — CLONAZEPAM 0.5 MG PO TABS
0.5000 mg | ORAL_TABLET | Freq: Once | ORAL | Status: AC
  Administered 2014-05-14: 0.5 mg via ORAL
  Filled 2014-05-14: qty 1

## 2014-05-14 MED ORDER — DEXTROSE 5 % IV SOLN
1.0000 g | Freq: Two times a day (BID) | INTRAVENOUS | Status: DC
  Administered 2014-05-14 – 2014-05-18 (×9): 1 g via INTRAVENOUS
  Filled 2014-05-14 (×9): qty 1

## 2014-05-14 MED ORDER — PANTOPRAZOLE SODIUM 40 MG IV SOLR
40.0000 mg | Freq: Two times a day (BID) | INTRAVENOUS | Status: DC
  Administered 2014-05-14 – 2014-05-18 (×8): 40 mg via INTRAVENOUS
  Filled 2014-05-14 (×8): qty 40

## 2014-05-14 MED ORDER — FUROSEMIDE 10 MG/ML IJ SOLN
20.0000 mg | Freq: Once | INTRAMUSCULAR | Status: AC
  Administered 2014-05-14: 20 mg via INTRAVENOUS
  Filled 2014-05-14: qty 2

## 2014-05-14 MED ORDER — PROMETHAZINE HCL 25 MG/ML IJ SOLN
12.5000 mg | Freq: Once | INTRAMUSCULAR | Status: AC
  Administered 2014-05-14: 12.5 mg via INTRAVENOUS
  Filled 2014-05-14: qty 1

## 2014-05-14 MED ORDER — CARBIDOPA-LEVODOPA ER 25-100 MG PO TBCR
1.0000 | EXTENDED_RELEASE_TABLET | Freq: Two times a day (BID) | ORAL | Status: DC
  Administered 2014-05-14: 1 via ORAL
  Filled 2014-05-14 (×4): qty 1

## 2014-05-14 MED ORDER — VANCOMYCIN HCL 500 MG IV SOLR
500.0000 mg | Freq: Two times a day (BID) | INTRAVENOUS | Status: DC
  Administered 2014-05-14 – 2014-05-17 (×8): 500 mg via INTRAVENOUS
  Filled 2014-05-14 (×9): qty 500

## 2014-05-14 NOTE — Progress Notes (Signed)
PT Cancellation Note  Patient Details Name: SHAQUELA WEICHERT MRN: [id number] DOB: [birth year]   Cancelled Treatment:    Reason Eval/Treat Not Completed: Medical issues which prohibited therapy (Rapid response/respiratory distress, on BiPAP currently. RN stated check back tomorrow.)   Claretha Cooper 05/14/2014, 8:25 AM Tresa Endo PT [phone]

## 2014-05-14 NOTE — Significant Event (Signed)
Rapid Response Event Note  Overview: Time Called: 0400 Arrival Time: 0410 Event Type: Respiratory  Initial Focused Assessment: PT. LYING ON LEFT SIDE, DROWSY AND NOT ABLE TO COMMUNICATE IN FULL SENTENCES. LIPS DUSKY AND SKIN PALE.    Interventions:PT REPOSITIONED AND BMV THEN PUT ON NRB. ABG DONE.  TRNASFERRED TO 1227 FOR BIPAP--CXR DONE    Event Summary: Name of Physician NotifiedCurt Bears [facility]  at 0415    at    Outcome: Transferred (Comment) (TO 1227 FOR BIPAP)     Pricilla Riffle

## 2014-05-14 NOTE — Progress Notes (Signed)
OT Cancellation Note  Patient Details Name: Jill White MRN: [id number] DOB: 10-02-1940   Cancelled Treatment:    Reason Eval/Treat Not Completed: Other (comment).  Noted PT cancelled pt earlier and HR still high. Will likely check back on Friday.  Letroy Vazguez 05/14/2014, 12:20 PM  Lesle Chris, OTR/L [phone] 05/14/2014

## 2014-05-14 NOTE — Evaluation (Signed)
Clinical/Bedside Swallow Evaluation Patient Details  Name: Jill White MRN: [id number] Date of Birth: March 22, 1941  Today's Date: 05/14/2014 Time: 1037-1059 SLP Time Calculation (min) (ACUTE ONLY): 22 min  Past Medical History:  Past Medical History  Diagnosis Date  . HIP PAIN   . HYPERTENSION   . Restless leg syndrome   . COPD (chronic obstructive pulmonary disease)     a. Multiple prior admissions for acute hypoxic respiratory failure in setting of COPD exacerbations +/- pneumonia.  . Peptic ulcer disease     a. 2013: gastric antrum.  Marland Kitchen Hyponatremia   . Tobacco abuse   . Hypomagnesemia   . LBBB (left bundle branch block)     a. Intermittent, likely rate related.  . Pulmonary HTN     a. Elevated PA pressure by prior echoes.  . Mitral regurgitation     a. Mild MR by echo 2014, not mentioned on 11/2013 (severely calcified annulus).  Marland Kitchen Respiratory failure     a. Adm 11/2013 with severe hypercarbic and hypoxemic respiratory failure requiring intubation.   . Abnormal echocardiogram     a. 11/2013: decreased EF 45-50%, moderate AS- > subsequent cath 12/02/13 with mildly calcified cors without obstruction, mild AS, EF 60%, mild pulm HTN.  Marland Kitchen Aortic stenosis     a. 11/2013: mod by echo, then mild by cath.  . Pulmonary HTN     a. 11/2013: mild by cath.  . Wide-complex tachycardia     a. Felt likely atrial tach with rate-related LBBB.  Marland Kitchen Chronic diastolic CHF (congestive heart failure)    Past Surgical History:  Past Surgical History  Procedure Laterality Date  . Abdominal hysterectomy    . Dilation and curettage of uterus    . Tonsillectomy    . Esophagogastroduodenoscopy  07/09/2011    Procedure: ESOPHAGOGASTRODUODENOSCOPY (EGD);  Surgeon: Jill Sabins, MD;  Location: [facility];  Service: Endoscopy;  Laterality: N/A;  patient in room 1532  . Cataract extraction w/ intraocular lens  implant, bilateral Bilateral 03/2013    Patient claims it was within the month.    HPI:  Jill White  was admited to [facility] with non-traumatic compression fx of T6 thoracic vertebrae.  Pt with PMH + for COPD, ulcer dx, tobacco abuse-ongoing, EGD 1/13, mitral regurgitation, pulmonary HTN.  Pt became lethargic over night with respiratory concerns - per MD note suspect due to narcotics compounded by hypercarbia.  Pt required bipap support and is now on Nasal cannula. CXR showed right base opacity - pna vs aspiration.  Swallow evaluation ordered.    Assessment / Plan / Recommendation Clinical Impression  Pt presents with negative CN exam and intact oropharyngeal swallow based on clinical evaluation.  She is sleepy today but reports she has insomnia and sleeps during the day at home.    No s/s of aspiration with few boluses of thin water (x4 straw boluses) and pt observed to exhale after swallow which will maximize airway protection.  SLP did not offer further boluses due to results of KUB.  Pt denies dysphagia to foods/drinks including coughing with intake nor reflux symptoms.  Note she did undergo EGD 06/2011 and was found to have Peptic ulcer dx.    She does admit to having difficulty swallowing large pills such as potassium.  Discussed alternative medication administration strategies.  Provided pt with handout including compensations to mitigate aspiration risk.    Although SLP did not test solids, pt intact articulation abilities and negative CN exam indicate likely adequate tolerance  of solids.  SLP to sign off as all education completed.  Thanks for this order.     Aspiration Risk  Mild (due to COPD, educated pt to mitigation strategies)    Diet Recommendation Regular;Thin liquid (if GI allows)   Liquid Administration via: Straw;Cup Medication Administration:  (as tolerated - defer to pt to determine administration) Supervision: Patient able to self feed Compensations: Slow rate;Small sips/bites (rest if dyspneic) Postural Changes and/or Swallow Maneuvers: Seated upright 90 degrees;Upright 30-60  min after meal    Other  Recommendations Oral Care Recommendations: Oral care BID   Follow Up Recommendations  None    Frequency and Duration   n/a     Pertinent Vitals/Pain Febrile, decreased     Swallow Study Prior Functional Status   see [place] Date of Onset: 05/14/14 HPI: Jill White was admited to [facility] with non-traumatic compression fx of T6 thoracic vertebrae.  Pt with PMH + for COPD, ulcer dx, tobacco abuse-ongoing, EGD 1/13, mitral regurgitation, pulmonary HTN.  Pt became lethargic over night with respiratory concerns - per MD note suspect due to narcotics compounded by hypercarbia.  Pt required bipap support and is now on Nasal cannula. CXR showed right base opacity - pna vs aspiration.  Swallow evaluation ordered.  Type of Study: Bedside swallow evaluation Diet Prior to this Study: NPO;Thin liquids (RN has given medicine with water today with good tolerance) Temperature Spikes Noted: Yes Respiratory Status: Nasal cannula History of Recent Intubation: No Behavior/Cognition: Alert;Cooperative (sleepy, pt states she sleeps during the day at home as she has insomnia) Oral Cavity - Dentition: Adequate natural dentition Self-Feeding Abilities: Able to feed self Patient Positioning: Upright in bed Baseline Vocal Quality: Clear Volitional Cough: Other (Comment) (DNT due to thoracic fx) Volitional Swallow: Able to elicit    Oral/Motor/Sensory Function Overall Oral Motor/Sensory Function: Appears within functional limits for tasks assessed   Ice Chips Ice chips: Not tested   Thin Liquid Thin Liquid: Within functional limits Presentation: Self Fed;Straw    Nectar Thick Nectar Thick Liquid: Not tested   Honey Thick Honey Thick Liquid: Not tested   Puree Puree: Not tested Other Comments: secondary to findings on KUB   Solid   GO    Solid: Not tested Other Comments: secondary to findings on [place], [place] [facility] [place]

## 2014-05-14 NOTE — Care Management Note (Signed)
    Page 1 of 2   05/14/2014     1:00:41 PM CARE MANAGEMENT NOTE 05/14/2014  Patient:  Jill White, Jill White   Account Number:  [id number]  Date Initiated:  05/14/2014  Documentation initiated by:  Lindley Stachnik  Subjective/Objective Assessment:   73 y.o. female    Chest pain started on Friday. Radiates to her back. Acute onset while stooling. Pain is constant and worsened w/ movement. Getting worse. Tramadol w/o benefit. Sharp in nature. Improves w/ rest. Associated w/ nausea.     Action/Plan:   smoker/uses home o2/lives alone/ has support groups through adult children   Anticipated DC Date:  05/17/2014   Anticipated DC Plan:  [place]  In-house referral  NA      DC Planning Services  CM consult      [facility] Choice  Resumption Of Svcs/PTA Provider   Choice offered to / List presented to:  NA   DME arranged  NA      DME agency  [place] arranged  NA      [place].   Status of service:  In process, will continue to follow Medicare Important Message given?   (If response is "NO", the following Medicare IM given date fields will be blank) Date Medicare IM given:   Medicare IM given by:   Date Additional Medicare IM given:   Additional Medicare IM given by:    Discharge Disposition:    Per UR Regulation:  Reviewed for med. necessity/level of care/duration of stay  If discussed at [place] of Stay Meetings, dates discussed:    Comments:  [id number]/Novie Maggio Rosana Hoes, RN, BSN, CCM Chart reviewed. Discharge needs and patient's stay to be reviewed and followed by case manager. Chart note for progression of stay: 73 yo active smoker(1 ppd) despite needing PRN O2 at home and frequent exacerbations of COPD who presented 11-23 with chest and back pain(negative trop/increcreased T6 compression fx) and was treated with PO/IV MSO4 and subsequently 0200 11/25 she developed hypercarbic /hypoxic respiratory failure and  required NIMVS and and transfer to ICU. She responded to interventions and NIMVS was discontinued at 0920. All narcotics have been discontinued and PCCM has evaluated.

## 2014-05-14 NOTE — Progress Notes (Signed)
ANTIBIOTIC CONSULT NOTE - INITIAL  Pharmacy Consult for Cefepime/Vancomycin Indication: HCAP  Allergies  Allergen Reactions  . Tetanus Toxoid Anaphylaxis  . Codeine Nausea And Vomiting and Other (See Comments)    Itching and faint   . Erythromycin Nausea And Vomiting  . Meloxicam Itching  . Methylprednisolone Hives  . Penicillins Hives  . Prednisone Other (See Comments)    GI bleed    Patient Measurements: Height: 5\' 2"  (157.5 cm) Weight: 130 lb 9.6 oz (59.24 kg) IBW/kg (Calculated) : 50.1   Vital Signs: Temp: 98.4 F (36.9 C) (11/25 0600) Temp Source: Axillary (11/25 0600) BP: 115/33 mmHg (11/25 0600) Pulse Rate: 94 (11/25 0600) Intake/Output from previous day: 11/24 0701 - 11/25 0700 In: 937.5 [P.O.:600; I.V.:337.5] Out: -  Intake/Output from this shift: Total I/O In: 240 [P.O.:240] Out: -   Labs:  Recent Labs  05/12/14 1344 05/12/14 1404 05/12/14 1446 05/13/14 0445  WBC 12.8*  --   --  6.6  HGB 15.2* 18.4*  --  11.5*  PLT 364  --   --  270  CREATININE HEMOLYZED SPECIMEN, RESULTS MAY BE AFFECTED 0.50 0.47* 0.67   Estimated Creatinine Clearance: 48.1 mL/min (by C-G formula based on Cr of 0.67). No results for input(s): VANCOTROUGH, VANCOPEAK, VANCORANDOM, GENTTROUGH, GENTPEAK, GENTRANDOM, TOBRATROUGH, TOBRAPEAK, TOBRARND, AMIKACINPEAK, AMIKACINTROU, AMIKACIN in the last 72 hours.   Microbiology: Recent Results (from the past 720 hour(s))  MRSA PCR Screening     Status: None   Collection Time: 05/14/14  4:54 AM  Result Value Ref Range Status   MRSA by PCR NEGATIVE NEGATIVE Final    Comment:        The GeneXpert MRSA Assay (FDA approved for NASAL specimens only), is one component of a comprehensive MRSA colonization surveillance program. It is not intended to diagnose MRSA infection nor to guide or monitor treatment for MRSA infections.     Medical History: Past Medical History  Diagnosis Date  . HIP PAIN   . HYPERTENSION   . Restless leg  syndrome   . COPD (chronic obstructive pulmonary disease)     a. Multiple prior admissions for acute hypoxic respiratory failure in setting of COPD exacerbations +/- pneumonia.  . Peptic ulcer disease     a. 2013: gastric antrum.  Marland Kitchen Hyponatremia   . Tobacco abuse   . Hypomagnesemia   . LBBB (left bundle branch block)     a. Intermittent, likely rate related.  . Pulmonary HTN     a. Elevated PA pressure by prior echoes.  . Mitral regurgitation     a. Mild MR by echo 2014, not mentioned on 11/2013 (severely calcified annulus).  Marland Kitchen Respiratory failure     a. Adm 11/2013 with severe hypercarbic and hypoxemic respiratory failure requiring intubation.   . Abnormal echocardiogram     a. 11/2013: decreased EF 45-50%, moderate AS- > subsequent cath 12/02/13 with mildly calcified cors without obstruction, mild AS, EF 60%, mild pulm HTN.  Marland Kitchen Aortic stenosis     a. 11/2013: mod by echo, then mild by cath.  . Pulmonary HTN     a. 11/2013: mild by cath.  . Wide-complex tachycardia     a. Felt likely atrial tach with rate-related LBBB.  Marland Kitchen Chronic diastolic CHF (congestive heart failure)     Medications:  Scheduled:  . acetaminophen  1,000 mg Oral Q8H  . antiseptic oral rinse  7 mL Mouth Rinse BID  . aspirin  81 mg Oral q morning - 10a  .  budesonide-formoterol  2 puff Inhalation BID  . calcitonin (salmon)  1 spray Alternating Nares Daily  . calcium-vitamin D  1 tablet Oral TID  . ceFEPime (MAXIPIME) IV  1 g Intravenous Q12H  . clonazePAM  0.5 mg Oral QHS  . diltiazem  120 mg Oral Daily  . furosemide  20 mg Intravenous Once  . furosemide  20 mg Oral Daily  . heparin  5,000 Units Subcutaneous 3 times per day  . losartan  50 mg Oral Daily  . morphine  30 mg Oral Q12H  . nicotine  14 mg Transdermal Daily  . potassium chloride SA  40 mEq Oral BID  . senna  1 tablet Oral BID  . sodium chloride  3 mL Intravenous Q12H  . tiotropium  18 mcg Inhalation Daily  . vancomycin  500 mg Intravenous BID    Infusions:   Assessment: 73 yoF found obtunded , hypoxic.  Cefepime and Vancomycin per Rx for HCAP.   Goal of Therapy:  Vancomycin trough level 15-20 mcg/ml  Plan:   Cefepime 1Gm IV q12h  Vancomycin 500mg  IV q12h  F/U SCr/levels/cultures as needed   Lawana Pai R 05/14/2014,6:18 AM

## 2014-05-14 NOTE — Progress Notes (Signed)
RN asked RT to place PT on BiPAP- RT presented in PT room and granddaughter states she feels PT is OK and refuses BiPAP at this time- PT agrees. RN aware. Sp02 93%.

## 2014-05-14 NOTE — Progress Notes (Signed)
  Recent Labs Lab 05/12/14 1404 05/14/14 0430 05/14/14 0659  PHART  --  7.222* 7.279*  PCO2ART  --  75.2* 67.9*  PO2ART  --  110.0* 61.5*  HCO3  --  29.7* 30.8*  TCO2 32 27.7 28.5  O2SAT  --  97.7 89.7    Camera exam  - currently ok buyt deconditioned  Plan rec bipap qhs - but she is refusing   Dr. Brand Males, M.D., [facility] Pulmonary and Critical Care Medicine Staff Physician [facility] Pager: [phone], If no answer or between  15:00h - 7:00h: call [phone]  05/14/2014 5:18 PM

## 2014-05-14 NOTE — Consult Note (Signed)
Unassigned Consult.  Reason for Consult: Bowel distension Referring Physician: [facility]  Jill White HPI: This is a 73 year old femal with a PMH of HTN, COPD, and PUD who was admitted for chest pain.  The pain starteed on Friday and it radiated to her back.  She has a history of a T6 compression fracture and she was treated with morphine after being ruled out for an MI and PE.  Unfortunately she developed hypercarbic respiratory failure, but she responded to BIPAP.  The patient also complained of abdominal pain and nausea/vomting.  A KUB was obtained and it revealed a 10 cm dilation of a loop of colon.  As a result of the finding a GI consult was requested.  Past Medical History  Diagnosis Date  . HIP PAIN   . HYPERTENSION   . Restless leg syndrome   . COPD (chronic obstructive pulmonary disease)     a. Multiple prior admissions for acute hypoxic respiratory failure in setting of COPD exacerbations +/- pneumonia.  . Peptic ulcer disease     a. 2013: gastric antrum.  Marland Kitchen Hyponatremia   . Tobacco abuse   . Hypomagnesemia   . LBBB (left bundle branch block)     a. Intermittent, likely rate related.  . Pulmonary HTN     a. Elevated PA pressure by prior echoes.  . Mitral regurgitation     a. Mild MR by echo 2014, not mentioned on 11/2013 (severely calcified annulus).  Marland Kitchen Respiratory failure     a. Adm 11/2013 with severe hypercarbic and hypoxemic respiratory failure requiring intubation.   . Abnormal echocardiogram     a. 11/2013: decreased EF 45-50%, moderate AS- > subsequent cath 12/02/13 with mildly calcified cors without obstruction, mild AS, EF 60%, mild pulm HTN.  Marland Kitchen Aortic stenosis     a. 11/2013: mod by echo, then mild by cath.  . Pulmonary HTN     a. 11/2013: mild by cath.  . Wide-complex tachycardia     a. Felt likely atrial tach with rate-related LBBB.  Marland Kitchen Chronic diastolic CHF (congestive heart failure)     Past Surgical History  Procedure Laterality Date  . Abdominal  hysterectomy    . Dilation and curettage of uterus    . Tonsillectomy    . Esophagogastroduodenoscopy  07/09/2011    Procedure: ESOPHAGOGASTRODUODENOSCOPY (EGD);  Surgeon: Missy Sabins, MD;  Location: [facility];  Service: Endoscopy;  Laterality: N/A;  patient in room 1532  . Cataract extraction w/ intraocular lens  implant, bilateral Bilateral 03/2013    Patient claims it was within the month.     Family History  Problem Relation Age of Onset  . Heart disease Sister   . Heart disease Brother   . Dementia Mother   . Anemia Father     Social History:  reports that she has been smoking Cigarettes.  She has a 50 pack-year smoking history. She has never used smokeless tobacco. She reports that she drinks about 0.5 oz of alcohol per week. She reports that she does not use illicit drugs.  Allergies:  Allergies  Allergen Reactions  . Tetanus Toxoid Anaphylaxis  . Codeine Nausea And Vomiting and Other (See Comments)    Itching and faint   . Erythromycin Nausea And Vomiting  . Meloxicam Itching  . Methylprednisolone Hives  . Penicillins Hives  . Prednisone Other (See Comments)    GI bleed    Medications:  Scheduled: . acetaminophen  1,000 mg Oral Q8H  .  antiseptic oral rinse  7 mL Mouth Rinse BID  . aspirin  81 mg Oral q morning - 10a  . budesonide-formoterol  2 puff Inhalation BID  . calcitonin (salmon)  1 spray Alternating Nares Daily  . ceFEPime (MAXIPIME) IV  1 g Intravenous Q12H  . diltiazem  120 mg Oral Daily  . furosemide  20 mg Oral Daily  . heparin  5,000 Units Subcutaneous 3 times per day  . nicotine  14 mg Transdermal Daily  . senna  1 tablet Oral BID  . sodium chloride  3 mL Intravenous Q12H  . tiotropium  18 mcg Inhalation Daily  . vancomycin  500 mg Intravenous BID   Continuous:   Results for orders placed or performed during the hospital encounter of 05/12/14 (from the past 24 hour(s))  Blood gas, arterial     Status: Abnormal   Collection Time: 05/14/14   4:30 AM  Result Value Ref Range   FIO2 1.00 %   Delivery systems NON-REBREATHER OXYGEN MASK    pH, Arterial 7.222 (L) 7.350 - 7.450   pCO2 arterial 75.2 (HH) 35.0 - 45.0 mmHg   pO2, Arterial 110.0 (H) 80.0 - 100.0 mmHg   Bicarbonate 29.7 (H) 20.0 - 24.0 mEq/L   TCO2 27.7 0 - 100 mmol/L   Acid-Base Excess 0.3 0.0 - 2.0 mmol/L   O2 Saturation 97.7 %   Patient temperature 98.6    Collection site RIGHT BRACHIAL    Drawn by [id number]    Sample type ARTERIAL DRAW   MRSA PCR Screening     Status: None   Collection Time: 05/14/14  4:54 AM  Result Value Ref Range   MRSA by PCR NEGATIVE NEGATIVE  Pro b natriuretic peptide     Status: Abnormal   Collection Time: 05/14/14  5:33 AM  Result Value Ref Range   Pro B Natriuretic peptide (BNP) 1351.0 (H) 0 - 450 pg/mL  Comprehensive metabolic panel     Status: Abnormal   Collection Time: 05/14/14  5:33 AM  Result Value Ref Range   Sodium 131 (L) 137 - 147 mEq/L   Potassium 5.0 3.7 - 5.3 mEq/L   Chloride 90 (L) 96 - 112 mEq/L   CO2 31 19 - 32 mEq/L   Glucose, Bld 128 (H) 70 - 99 mg/dL   BUN 11 6 - 23 mg/dL   Creatinine, Ser 0.53 0.50 - 1.10 mg/dL   Calcium 9.3 8.4 - 10.5 mg/dL   Total Protein 7.0 6.0 - 8.3 g/dL   Albumin 3.2 (L) 3.5 - 5.2 g/dL   AST 15 0 - 37 U/L   ALT 10 0 - 35 U/L   Alkaline Phosphatase 77 39 - 117 U/L   Total Bilirubin 0.3 0.3 - 1.2 mg/dL   GFR calc non Af Amer >90 >90 mL/min   GFR calc Af Amer >90 >90 mL/min   Anion gap 10 5 - 15  Lactic acid, plasma     Status: None   Collection Time: 05/14/14  6:45 AM  Result Value Ref Range   Lactic Acid, Venous 0.8 0.5 - 2.2 mmol/L  CBC with Differential     Status: Abnormal   Collection Time: 05/14/14  6:45 AM  Result Value Ref Range   WBC 13.8 (H) 4.0 - 10.5 K/uL   RBC 3.76 (L) 3.87 - 5.11 MIL/uL   Hemoglobin 12.1 12.0 - 15.0 g/dL   HCT 37.7 36.0 - 46.0 %   MCV 100.3 (H) 78.0 - 100.0 fL  MCH 32.2 26.0 - 34.0 pg   MCHC 32.1 30.0 - 36.0 g/dL   RDW 12.9 11.5 - 15.5 %    Platelets 290 150 - 400 K/uL   Neutrophils Relative % 84 (H) 43 - 77 %   Neutro Abs 11.6 (H) 1.7 - 7.7 K/uL   Lymphocytes Relative 8 (L) 12 - 46 %   Lymphs Abs 1.1 0.7 - 4.0 K/uL   Monocytes Relative 8 3 - 12 %   Monocytes Absolute 1.1 (H) 0.1 - 1.0 K/uL   Eosinophils Relative 0 0 - 5 %   Eosinophils Absolute 0.0 0.0 - 0.7 K/uL   Basophils Relative 0 0 - 1 %   Basophils Absolute 0.0 0.0 - 0.1 K/uL  Blood gas, arterial     Status: Abnormal   Collection Time: 05/14/14  6:59 AM  Result Value Ref Range   FIO2 0.50 %   Delivery systems BILEVEL POSITIVE AIRWAY PRESSURE    Mode BILEVEL POSITIVE AIRWAY PRESSURE    Rate 8 resp/min   Inspiratory PAP 10    Expiratory PAP 5    pH, Arterial 7.279 (L) 7.350 - 7.450   pCO2 arterial 67.9 (HH) 35.0 - 45.0 mmHg   pO2, Arterial 61.5 (L) 80.0 - 100.0 mmHg   Bicarbonate 30.8 (H) 20.0 - 24.0 mEq/L   TCO2 28.5 0 - 100 mmol/L   Acid-Base Excess 2.7 (H) 0.0 - 2.0 mmol/L   O2 Saturation 89.7 %   Patient temperature 98.4    Collection site RIGHT RADIAL    Drawn by [id number]    Sample type ARTERIAL    Allens test (pass/fail) PASS PASS  Strep pneumoniae urinary antigen     Status: None   Collection Time: 05/14/14  8:26 AM  Result Value Ref Range   Strep Pneumo Urinary Antigen NEGATIVE NEGATIVE     Dg Abd 1 View  05/14/2014   CLINICAL DATA:  Generalized abdominal pain, nausea and vomiting  EXAM: ABDOMEN - 1 VIEW  COMPARISON:  05/12/2014 and 6/6/ 15  FINDINGS: There is gaseous distended viscus in mid and left abdomen measures at least 9.6 cm in diameter. This is highly suspicious for segmental gaseous distension of probable transverse colon. Colonic ileus or colonic obstruction cannot be excluded. Some gas noted within stomach.  IMPRESSION: Gaseous distended viscus in mid and left abdomen measuring at least 9.6 cm in diameter highly suspicious for colonic segmental distension. Highly suspicious for segmental ileus or colonic obstruction. Clinical correlation  is necessary.   Electronically Signed   By: Lahoma Crocker M.D.   On: 05/14/2014 10:10   Ct Angio Chest Pe W/cm &/or Wo Cm  05/12/2014   CLINICAL DATA:  Back pain between the shoulder blades. Evaluate for dissection. History of COPD.  EXAM: CT ANGIOGRAPHY CHEST AND ABDOMEN  TECHNIQUE: Multidetector CT imaging of the chest and abdomen was performed using the standard protocol during bolus administration of intravenous contrast. Multiplanar CT image reconstructions and MIPs were obtained to evaluate the vascular anatomy.  CONTRAST:  123mL OMNIPAQUE IOHEXOL 350 MG/ML SOLN  COMPARISON:  11/22/2013.  07/06/2011.  FINDINGS: CTA CHEST FINDINGS  There is no evidence of intramural hematoma. Precontrast images demonstrate extensive atherosclerotic calcifications in the vasculature and 3 vessel coronary artery calcifications. There is profound mitral annular calcifications. Moderate degree of aortic valvular calcification.  There is no evidence of aortic dissection. Atherosclerotic plaque is noted. There is extensive calcification in the proximal left subclavian artery  Blooming artifact makes quantification of narrowing difficult.  There are no obvious filling defects in the pulmonary arterial tree to suggest acute pulmonary thromboembolism.  No abnormal mediastinal or hilar adenopathy.  No pneumothorax or pleural effusion  Moderate centrilobular emphysema. Linear atelectasis towards the lung bases.  Worsening T6 compression fracture with 30 % loss of height anteriorly. Slight retropulsion of the inferior endplate.  Review of the MIP images confirms the above findings.  CTA ABDOMEN FINDINGS  The aorta is non aneurysmal and patent. Diffuse moderate atherosclerotic calcifications of the aorta. There is some irregular calcified plaque in the infrarenal aorta till left and just below the left renal artery.  Celiac origin is calcified without obvious significant narrowing. Branch vessels are patent.  SMA is grossly patent with  calcifications at its origin and branch vessels are grossly patent.  IMA origin is calcified. It is grossly patent. Branch vessels are grossly patent.  Single right renal artery and 3 left renal arteries are patent. Retro aortic left renal vein anatomy is present.  Right common iliac artery is patent. There is calcified plaque at the origin of the left common iliac artery making quantification of narrowing difficult.  Focal enhancement at the dome of the liver on image 74 is stable. Wedge-shaped enhancement at the inferior tip of the right lobe is also not significantly changed. The former probably represents a benign entity such is M angioma and lateral likely represents phase of contrast or a vascular phenomenon.  Spleen, pancreas, gallbladder, adrenal glands are within normal limits. Kidneys are within normal limits.  Extensive diverticulosis throughout the colon is noted. Advanced degenerative changes throughout the lumbar spine. Lumbosacral junction is transitional.  Review of the MIP images confirms the above findings.  IMPRESSION: No evidence of intramural hematoma or aortic dissection. No acute vascular pathology is present on this study.  Worsening T6 compression deformity since the prior study dated November 22, 2013. MRI can be performed to further characterize.   Electronically Signed   By: Maryclare Bean M.D.   On: 05/12/2014 15:44   Ct Angio Abdomen W/cm &/or Wo Contrast  05/12/2014   CLINICAL DATA:  Back pain between the shoulder blades. Evaluate for dissection. History of COPD.  EXAM: CT ANGIOGRAPHY CHEST AND ABDOMEN  TECHNIQUE: Multidetector CT imaging of the chest and abdomen was performed using the standard protocol during bolus administration of intravenous contrast. Multiplanar CT image reconstructions and MIPs were obtained to evaluate the vascular anatomy.  CONTRAST:  152mL OMNIPAQUE IOHEXOL 350 MG/ML SOLN  COMPARISON:  11/22/2013.  07/06/2011.  FINDINGS: CTA CHEST FINDINGS  There is no evidence of  intramural hematoma. Precontrast images demonstrate extensive atherosclerotic calcifications in the vasculature and 3 vessel coronary artery calcifications. There is profound mitral annular calcifications. Moderate degree of aortic valvular calcification.  There is no evidence of aortic dissection. Atherosclerotic plaque is noted. There is extensive calcification in the proximal left subclavian artery  Blooming artifact makes quantification of narrowing difficult.  There are no obvious filling defects in the pulmonary arterial tree to suggest acute pulmonary thromboembolism.  No abnormal mediastinal or hilar adenopathy.  No pneumothorax or pleural effusion  Moderate centrilobular emphysema. Linear atelectasis towards the lung bases.  Worsening T6 compression fracture with 30 % loss of height anteriorly. Slight retropulsion of the inferior endplate.  Review of the MIP images confirms the above findings.  CTA ABDOMEN FINDINGS  The aorta is non aneurysmal and patent. Diffuse moderate atherosclerotic calcifications of the aorta. There is some irregular calcified plaque in the infrarenal aorta till left and  just below the left renal artery.  Celiac origin is calcified without obvious significant narrowing. Branch vessels are patent.  SMA is grossly patent with calcifications at its origin and branch vessels are grossly patent.  IMA origin is calcified. It is grossly patent. Branch vessels are grossly patent.  Single right renal artery and 3 left renal arteries are patent. Retro aortic left renal vein anatomy is present.  Right common iliac artery is patent. There is calcified plaque at the origin of the left common iliac artery making quantification of narrowing difficult.  Focal enhancement at the dome of the liver on image 74 is stable. Wedge-shaped enhancement at the inferior tip of the right lobe is also not significantly changed. The former probably represents a benign entity such is M angioma and lateral likely  represents phase of contrast or a vascular phenomenon.  Spleen, pancreas, gallbladder, adrenal glands are within normal limits. Kidneys are within normal limits.  Extensive diverticulosis throughout the colon is noted. Advanced degenerative changes throughout the lumbar spine. Lumbosacral junction is transitional.  Review of the MIP images confirms the above findings.  IMPRESSION: No evidence of intramural hematoma or aortic dissection. No acute vascular pathology is present on this study.  Worsening T6 compression deformity since the prior study dated November 22, 2013. MRI can be performed to further characterize.   Electronically Signed   By: Maryclare Bean M.D.   On: 05/12/2014 15:44   Dg Chest Port 1 View  05/14/2014   CLINICAL DATA:  Hypoxia  EXAM: PORTABLE CHEST - 1 VIEW  COMPARISON:  05/12/2014  FINDINGS: Stable cardiomegaly and aortic tortuosity.  There is interstitial coarsening which is increased from prior, with subtle [employer]. There is a new airspace opacity at the right base. No effusion or pneumothorax.  IMPRESSION: 1. New right basilar opacity suspicious for pneumonia or aspiration pneumonitis. 2. Developing pulmonary edema.   Electronically Signed   By: Jorje Guild M.D.   On: 05/14/2014 05:38   Dg Chest Port 1 View  05/12/2014   CLINICAL DATA:  Chest and upper back pain with shortness of Breath  EXAM: PORTABLE CHEST - 1 VIEW  COMPARISON:  05/05/2014  FINDINGS: Cardiac shadow is stable at the upper limits of normal. The lungs are mildly hyperinflated consistent with COPD. Mild stable interstitial changes are seen. No focal infiltrate or sizable effusion is noted. No bony abnormality is noted. Diffuse aortic calcifications are again seen.  IMPRESSION: COPD without acute abnormality.   Electronically Signed   By: Inez Catalina M.D.   On: 05/12/2014 14:06    ROS:  As stated above in the HPI otherwise negative.  Blood pressure 129/43, pulse 121, temperature 102.7 F (39.3 C), temperature  source Axillary, resp. rate 18, height 5\' 2"  (1.575 m), weight 59.24 kg (130 lb 9.6 oz), SpO2 90 %.    PE: Gen: NAD, Alert and Oriented HEENT:  [place]/AT, EOMI Neck: Supple, no LAD Lungs: CTA Bilaterally CV: RRR without M/G/R ABM: Soft, distended, tympanic, +BS Ext: No C/C/E  Assessment/Plan: 1) Small bowel ileus with significant focal loop dilation 2) COPD. 3) T6 compression fracture.   I reviewed the x-rays with Radiology.  In comparision with the recent CT scan it appears that she has a loop of small bowel that is dilated.  There is no colonic dilation.  In this situation she will be best served with an NG tube.  It is clear that she had this issue before his acute respiratory decompensation in the hospital.  Plan: 1) NG tube to low intermittent suctioning. 2) Serial abdominal X-rays.  Dudley Mages D 05/14/2014, 1:42 PM

## 2014-05-14 NOTE — Progress Notes (Signed)
[facility] PROGRESS NOTE  CARRAH EPPOLITO KWI:[id number] DOB: 1940-10-15 DOA: 05/12/2014 PCP: Laurey Morale, MD  Assessment/Plan: 1-Acute hypercapnic, hypoxic Respiratory Failure;  Secondary to oversedation, Heart failure PNA.  Patient on BIPAP, she is more alert, following command.  Continue with BIPAP.  Continue with Vancomycin and cefepime. IV lasix one time dose.  Chest x ray with evidence of PNA.  Speech evaluation.  CCM consulted.   2-Vomiting. Check KUB.   3-COPD;  Continue with Spiriva, and Symbicort.   4-Acute on Chronic systolic, diastolic HF exacerbation. Received one dose of IV lasix.  Hold Cozaar, continue with lasix, continue with Cardizem.   5-Worsening T 6 Compression fracture:  Hold pain medication to avoid oversedation due to events overnight.   6-DVT prophylaxis; Heparin.   Code Status: Full Code.  Family Communication: Care discussed with granddaughter.  Disposition Plan: Remain ICU.    Consultants:  CCM.   Procedures:  none  Antibiotics: Cefepime 11-25 Vancomycin 11-25   HPI/Subjective: Events from last night notice. Patient became obtunded, was vomiting per granddaughter. Patient received sleeping pills.  Patient now more alert, answering questions, wants to urinate.     Objective: Filed Vitals:   05/14/14 0700  BP: 147/43  Pulse: 102  Temp:   Resp: 22    Intake/Output Summary (Last 24 hours) at 05/14/14 0738 Last data filed at 05/13/14 2100  Gross per 24 hour  Intake  937.5 ml  Output      0 ml  Net  937.5 ml   Filed Weights   05/12/14 2016  Weight: 59.24 kg (130 lb 9.6 oz)    Exam:   General:  Sleepy, wake and answer question moving head, on BIPAP   Cardiovascular: S 1, S 2 RRR, systolic  murmur.   Respiratory: Bilateral air movement, bilateral crackles.   Abdomen: BS decrease, NT, mild distended. No rigidity.   Musculoskeletal: no edema.   Data Reviewed: Basic Metabolic Panel:  Recent Labs Lab  05/12/14 1344 05/12/14 1404 05/12/14 1446 05/13/14 0445 05/14/14 0533  NA HEMOLYZED SPECIMEN, RESULTS MAY BE AFFECTED 129* 131* 140 131*  K HEMOLYZED SPECIMEN, RESULTS MAY BE AFFECTED 5.9* 4.0 3.6* 5.0  CL HEMOLYZED SPECIMEN, RESULTS MAY BE AFFECTED 94* 90* 105 90*  CO2 HEMOLYZED SPECIMEN, RESULTS MAY BE AFFECTED  --  29 26 31   GLUCOSE HEMOLYZED SPECIMEN, RESULTS MAY BE AFFECTED 102* 102* 119* 128*  BUN HEMOLYZED SPECIMEN, RESULTS MAY BE AFFECTED 11 9 15 11   CREATININE HEMOLYZED SPECIMEN, RESULTS MAY BE AFFECTED 0.50 0.47* 0.67 0.53  CALCIUM HEMOLYZED SPECIMEN, RESULTS MAY BE AFFECTED  --  9.6 6.9* 9.3  MG  --   --   --  1.4*  --    Liver Function Tests:  Recent Labs Lab 05/12/14 1344 05/12/14 1446 05/13/14 0445 05/14/14 0533  AST HEMOLYZED SPECIMEN, RESULTS MAY BE AFFECTED 15 12 15   ALT HEMOLYZED SPECIMEN, RESULTS MAY BE AFFECTED 12 9 10   ALKPHOS HEMOLYZED SPECIMEN, RESULTS MAY BE AFFECTED 86 63 77  BILITOT HEMOLYZED SPECIMEN, RESULTS MAY BE AFFECTED 0.7 0.3 0.3  PROT HEMOLYZED SPECIMEN, RESULTS MAY BE AFFECTED 7.2 5.2* 7.0  ALBUMIN HEMOLYZED SPECIMEN, RESULTS MAY BE AFFECTED 3.6 2.5* 3.2*    Recent Labs Lab 05/12/14 1344 05/12/14 1446  LIPASE 20 14   No results for input(s): AMMONIA in the last 168 hours. CBC:  Recent Labs Lab 05/12/14 1344 05/12/14 1404 05/13/14 0445 05/14/14 0645  WBC 12.8*  --  6.6 13.8*  NEUTROABS 8.5*  --   --  11.6*  HGB 15.2* 18.4* 11.5* 12.1  HCT 43.9 54.0* 34.8* 37.7  MCV 95.0  --  99.1 100.3*  PLT 364  --  270 290   Cardiac Enzymes:  Recent Labs Lab 05/12/14 1623  TROPONINI <0.30   BNP (last 3 results)  Recent Labs  03/26/14 1005 05/12/14 1316 05/14/14 0533  PROBNP 94.0 577.4* 1351.0*   CBG: No results for input(s): GLUCAP in the last 168 hours.  Recent Results (from the past 240 hour(s))  MRSA PCR Screening     Status: None   Collection Time: 05/14/14  4:54 AM  Result Value Ref Range Status   MRSA by PCR  NEGATIVE NEGATIVE Final    Comment:        The GeneXpert MRSA Assay (FDA approved for NASAL specimens only), is one component of a comprehensive MRSA colonization surveillance program. It is not intended to diagnose MRSA infection nor to guide or monitor treatment for MRSA infections.      Studies: Ct Angio Chest Pe W/cm &/or Wo Cm  05/12/2014   CLINICAL DATA:  Back pain between the shoulder blades. Evaluate for dissection. History of COPD.  EXAM: CT ANGIOGRAPHY CHEST AND ABDOMEN  TECHNIQUE: Multidetector CT imaging of the chest and abdomen was performed using the standard protocol during bolus administration of intravenous contrast. Multiplanar CT image reconstructions and MIPs were obtained to evaluate the vascular anatomy.  CONTRAST:  145mL OMNIPAQUE IOHEXOL 350 MG/ML SOLN  COMPARISON:  11/22/2013.  07/06/2011.  FINDINGS: CTA CHEST FINDINGS  There is no evidence of intramural hematoma. Precontrast images demonstrate extensive atherosclerotic calcifications in the vasculature and 3 vessel coronary artery calcifications. There is profound mitral annular calcifications. Moderate degree of aortic valvular calcification.  There is no evidence of aortic dissection. Atherosclerotic plaque is noted. There is extensive calcification in the proximal left subclavian artery  Blooming artifact makes quantification of narrowing difficult.  There are no obvious filling defects in the pulmonary arterial tree to suggest acute pulmonary thromboembolism.  No abnormal mediastinal or hilar adenopathy.  No pneumothorax or pleural effusion  Moderate centrilobular emphysema. Linear atelectasis towards the lung bases.  Worsening T6 compression fracture with 30 % loss of height anteriorly. Slight retropulsion of the inferior endplate.  Review of the MIP images confirms the above findings.  CTA ABDOMEN FINDINGS  The aorta is non aneurysmal and patent. Diffuse moderate atherosclerotic calcifications of the aorta. There is  some irregular calcified plaque in the infrarenal aorta till left and just below the left renal artery.  Celiac origin is calcified without obvious significant narrowing. Branch vessels are patent.  SMA is grossly patent with calcifications at its origin and branch vessels are grossly patent.  IMA origin is calcified. It is grossly patent. Branch vessels are grossly patent.  Single right renal artery and 3 left renal arteries are patent. Retro aortic left renal vein anatomy is present.  Right common iliac artery is patent. There is calcified plaque at the origin of the left common iliac artery making quantification of narrowing difficult.  Focal enhancement at the dome of the liver on image 74 is stable. Wedge-shaped enhancement at the inferior tip of the right lobe is also not significantly changed. The former probably represents a benign entity such is M angioma and lateral likely represents phase of contrast or a vascular phenomenon.  Spleen, pancreas, gallbladder, adrenal glands are within normal limits. Kidneys are within normal limits.  Extensive diverticulosis throughout the colon is noted. Advanced degenerative changes throughout the lumbar  spine. Lumbosacral junction is transitional.  Review of the MIP images confirms the above findings.  IMPRESSION: No evidence of intramural hematoma or aortic dissection. No acute vascular pathology is present on this study.  Worsening T6 compression deformity since the prior study dated November 22, 2013. MRI can be performed to further characterize.   Electronically Signed   By: Maryclare Bean M.D.   On: 05/12/2014 15:44   Ct Angio Abdomen W/cm &/or Wo Contrast  05/12/2014   CLINICAL DATA:  Back pain between the shoulder blades. Evaluate for dissection. History of COPD.  EXAM: CT ANGIOGRAPHY CHEST AND ABDOMEN  TECHNIQUE: Multidetector CT imaging of the chest and abdomen was performed using the standard protocol during bolus administration of intravenous contrast. Multiplanar  CT image reconstructions and MIPs were obtained to evaluate the vascular anatomy.  CONTRAST:  122mL OMNIPAQUE IOHEXOL 350 MG/ML SOLN  COMPARISON:  11/22/2013.  07/06/2011.  FINDINGS: CTA CHEST FINDINGS  There is no evidence of intramural hematoma. Precontrast images demonstrate extensive atherosclerotic calcifications in the vasculature and 3 vessel coronary artery calcifications. There is profound mitral annular calcifications. Moderate degree of aortic valvular calcification.  There is no evidence of aortic dissection. Atherosclerotic plaque is noted. There is extensive calcification in the proximal left subclavian artery  Blooming artifact makes quantification of narrowing difficult.  There are no obvious filling defects in the pulmonary arterial tree to suggest acute pulmonary thromboembolism.  No abnormal mediastinal or hilar adenopathy.  No pneumothorax or pleural effusion  Moderate centrilobular emphysema. Linear atelectasis towards the lung bases.  Worsening T6 compression fracture with 30 % loss of height anteriorly. Slight retropulsion of the inferior endplate.  Review of the MIP images confirms the above findings.  CTA ABDOMEN FINDINGS  The aorta is non aneurysmal and patent. Diffuse moderate atherosclerotic calcifications of the aorta. There is some irregular calcified plaque in the infrarenal aorta till left and just below the left renal artery.  Celiac origin is calcified without obvious significant narrowing. Branch vessels are patent.  SMA is grossly patent with calcifications at its origin and branch vessels are grossly patent.  IMA origin is calcified. It is grossly patent. Branch vessels are grossly patent.  Single right renal artery and 3 left renal arteries are patent. Retro aortic left renal vein anatomy is present.  Right common iliac artery is patent. There is calcified plaque at the origin of the left common iliac artery making quantification of narrowing difficult.  Focal enhancement at the  dome of the liver on image 74 is stable. Wedge-shaped enhancement at the inferior tip of the right lobe is also not significantly changed. The former probably represents a benign entity such is M angioma and lateral likely represents phase of contrast or a vascular phenomenon.  Spleen, pancreas, gallbladder, adrenal glands are within normal limits. Kidneys are within normal limits.  Extensive diverticulosis throughout the colon is noted. Advanced degenerative changes throughout the lumbar spine. Lumbosacral junction is transitional.  Review of the MIP images confirms the above findings.  IMPRESSION: No evidence of intramural hematoma or aortic dissection. No acute vascular pathology is present on this study.  Worsening T6 compression deformity since the prior study dated November 22, 2013. MRI can be performed to further characterize.   Electronically Signed   By: Maryclare Bean M.D.   On: 05/12/2014 15:44   Dg Chest Port 1 View  05/14/2014   CLINICAL DATA:  Hypoxia  EXAM: PORTABLE CHEST - 1 VIEW  COMPARISON:  05/12/2014  FINDINGS: Stable  cardiomegaly and aortic tortuosity.  There is interstitial coarsening which is increased from prior, with subtle [employer]. There is a new airspace opacity at the right base. No effusion or pneumothorax.  IMPRESSION: 1. New right basilar opacity suspicious for pneumonia or aspiration pneumonitis. 2. Developing pulmonary edema.   Electronically Signed   By: Jorje Guild M.D.   On: 05/14/2014 05:38   Dg Chest Port 1 View  05/12/2014   CLINICAL DATA:  Chest and upper back pain with shortness of Breath  EXAM: PORTABLE CHEST - 1 VIEW  COMPARISON:  05/05/2014  FINDINGS: Cardiac shadow is stable at the upper limits of normal. The lungs are mildly hyperinflated consistent with COPD. Mild stable interstitial changes are seen. No focal infiltrate or sizable effusion is noted. No bony abnormality is noted. Diffuse aortic calcifications are again seen.  IMPRESSION: COPD without acute  abnormality.   Electronically Signed   By: Inez Catalina M.D.   On: 05/12/2014 14:06    Scheduled Meds: . acetaminophen  1,000 mg Oral Q8H  . antiseptic oral rinse  7 mL Mouth Rinse BID  . aspirin  81 mg Oral q morning - 10a  . budesonide-formoterol  2 puff Inhalation BID  . calcitonin (salmon)  1 spray Alternating Nares Daily  . calcium-vitamin D  1 tablet Oral TID  . ceFEPime (MAXIPIME) IV  1 g Intravenous Q12H  . clonazePAM  0.5 mg Oral QHS  . diltiazem  120 mg Oral Daily  . furosemide  20 mg Oral Daily  . heparin  5,000 Units Subcutaneous 3 times per day  . losartan  50 mg Oral Daily  . morphine  30 mg Oral Q12H  . nicotine  14 mg Transdermal Daily  . potassium chloride SA  40 mEq Oral BID  . senna  1 tablet Oral BID  . sodium chloride  3 mL Intravenous Q12H  . tiotropium  18 mcg Inhalation Daily  . vancomycin  500 mg Intravenous BID   Continuous Infusions:   Principal Problem:   Non-traumatic compression fracture of T6 thoracic vertebra Active Problems:   Essential hypertension   Tobacco abuse   COPD (chronic obstructive pulmonary disease)   Mild aortic stenosis   Back pain   Chest pain   LBBB (left bundle branch block)   Thoracic spine fracture   Combined systolic and diastolic heart failure   Compression fracture of thoracic spine, non-traumatic    Time spent: 35 minutes.     Niel Hummer A  [facility] Pager [phone]. If 7PM-7AM, please contact night-coverage at [email or web address], password [email or web address] 05/14/2014, 7:38 AM  LOS: 2 days

## 2014-05-14 NOTE — Consult Note (Signed)
PULMONARY / CRITICAL CARE MEDICINE   Name: Jill White MRN: [id number] DOB: [birth year]    ADMISSION DATE:  05/12/2014 CONSULTATION DATE:  11/25  REFERRING MD :  [facility]  CHIEF COMPLAINT: Back pain  INITIAL PRESENTATION: Back pain  STUDIES:    SIGNIFICANT EVENTS: 11/25 hypercarbic resp failure   HISTORY OF PRESENT ILLNESS:   73 yo active smoker(1 ppd) despite needing PRN O2 at home and frequent exacerbations of COPD who presented 11-23 with chest and back pain(negative trop/increcreased T6 compression fx) and was treated with PO/IV MSO4 and subsequently 0200 11/25 she developed hypercarbic /hypoxic respiratory failure and required NIMVS and and transfer to ICU. She responded to interventions and NIMVS was discontinued at 0920. All narcotics have been discontinued and PCCM has evaluated.   PAST MEDICAL HISTORY :   has a past medical history of HIP PAIN; HYPERTENSION; Restless leg syndrome; COPD (chronic obstructive pulmonary disease); Peptic ulcer disease; Hyponatremia; Tobacco abuse; Hypomagnesemia; LBBB (left bundle branch block); Pulmonary HTN; Mitral regurgitation; Respiratory failure; Abnormal echocardiogram; Aortic stenosis; Pulmonary HTN; Wide-complex tachycardia; and Chronic diastolic CHF (congestive heart failure).  has past surgical history that includes Abdominal hysterectomy; Dilation and curettage of uterus; Tonsillectomy; Esophagogastroduodenoscopy (07/09/2011); and Cataract extraction w/ intraocular lens  implant, bilateral (Bilateral, 03/2013). Prior to Admission medications   Medication Sig Start Date End Date Taking? Authorizing Provider  albuterol (PROVENTIL HFA;VENTOLIN HFA) 108 (90 BASE) MCG/ACT inhaler Inhale 2 puffs into the lungs every 4 (four) hours as needed for wheezing or shortness of breath. 06/24/13  Yes Laurey Morale, MD  aspirin 81 MG tablet Take 81 mg by mouth every morning.    Yes Historical Provider, MD  budesonide-formoterol (SYMBICORT) 80-4.5 MCG/ACT  inhaler Inhale 2 puffs into the lungs 2 (two) times daily. 12/03/13  Yes Erick Colace, NP  clonazePAM (KLONOPIN) 0.5 MG tablet TAKE TWO TABLET BY MOUTH EVERY AT BEDTIME 01/13/14  Yes Laurey Morale, MD  diltiazem (CARDIZEM CD) 120 MG 24 hr capsule Take 1 capsule (120 mg total) by mouth daily. 12/03/13  Yes Erick Colace, NP  furosemide (LASIX) 20 MG tablet TAKE ONE TABLET BY MOUTH ONE TIME DAILY  05/05/14  Yes Laurey Morale, MD  guaiFENesin (MUCINEX) 600 MG 12 hr tablet Take 600 mg by mouth 2 (two) times daily.   Yes Historical Provider, MD  losartan (COZAAR) 50 MG tablet Take 1 tablet (50 mg total) by mouth daily. 03/06/14  Yes Fay Records, MD  ondansetron (ZOFRAN) 4 MG tablet Take 4 mg by mouth every 4 (four) hours as needed for nausea or vomiting.   Yes Historical Provider, MD  Potassium Chloride ER 20 MEQ TBCR Take 40 mEq by mouth 2 (two) times daily. 03/26/14  Yes Fay Records, MD  tiotropium (SPIRIVA) 18 MCG inhalation capsule Place 1 capsule (18 mcg total) into inhaler and inhale daily. 12/03/13  Yes Erick Colace, NP  vitamin E 100 UNIT capsule Take 100 Units by mouth every morning.    Yes Historical Provider, MD  methylPREDNISolone (MEDROL) 2 MG tablet 2 pills daily for 2 days, 1 pill daily for 2 days Patient not taking: Reported on 05/12/2014 05/05/14   Chesley Mires, MD   Allergies  Allergen Reactions  . Tetanus Toxoid Anaphylaxis  . Codeine Nausea And Vomiting and Other (See Comments)    Itching and faint   . Erythromycin Nausea And Vomiting  . Meloxicam Itching  . Methylprednisolone Hives  . Penicillins Hives  . Prednisone Other (See  Comments)    GI bleed    FAMILY HISTORY:  indicated that her mother is deceased. She indicated that her father is deceased. She indicated that her sister is alive. She indicated that her brother is alive.  SOCIAL HISTORY:  reports that she has been smoking Cigarettes.  She has a 50 pack-year smoking history. She has never used smokeless tobacco.  She reports that she drinks about 0.5 oz of alcohol per week. She reports that she does not use illicit drugs.  REVIEW OF SYSTEMS:   10 point review of system taken, please see HPI for positives and negatives.   SUBJECTIVE:   VITAL SIGNS: Temp:  [98.2 F (36.8 C)-99.8 F (37.7 C)] 98.4 F (36.9 C) (11/25 0600) Pulse Rate:  [79-111] 109 (11/25 0800) Resp:  [18-24] 18 (11/25 0800) BP: (115-156)/(33-80) 148/47 mmHg (11/25 0800) SpO2:  [44 %-99 %] 98 % (11/25 0800) FiO2 (%):  [50 %] 50 % (11/25 0500) HEMODYNAMICS:   VENTILATOR SETTINGS: Vent Mode:  [-]  FiO2 (%):  [50 %] 50 % INTAKE / OUTPUT:  Intake/Output Summary (Last 24 hours) at 05/14/14 0909 Last data filed at 05/14/14 0826  Gross per 24 hour  Intake 1087.5 ml  Output    180 ml  Net  907.5 ml    PHYSICAL EXAMINATION: General:  Awake and alert WM NAD at rest Neuro:  Intact HEENT:  No JVD /LAN Cardiovascular:  HSR RRR Lungs:  Diminished bs, mild exp wheeze Abdomen:  Soft, NT, +bs Musculoskeletal:  Intact Skin:  Warm and dry  LABS:  CBC  Recent Labs Lab 05/12/14 1344 05/12/14 1404 05/13/14 0445 05/14/14 0645  WBC 12.8*  --  6.6 13.8*  HGB 15.2* 18.4* 11.5* 12.1  HCT 43.9 54.0* 34.8* 37.7  PLT 364  --  270 290   Coag's  Recent Labs Lab 05/12/14 1446  INR 0.96   BMET  Recent Labs Lab 05/12/14 1446 05/13/14 0445 05/14/14 0533  NA 131* 140 131*  K 4.0 3.6* 5.0  CL 90* 105 90*  CO2 29 26 31   BUN 9 15 11   CREATININE 0.47* 0.67 0.53  GLUCOSE 102* 119* 128*   Electrolytes  Recent Labs Lab 05/12/14 1446 05/13/14 0445 05/14/14 0533  CALCIUM 9.6 6.9* 9.3  MG  --  1.4*  --    Sepsis Markers  Recent Labs Lab 05/14/14 0645  LATICACIDVEN 0.8   ABG  Recent Labs Lab 05/14/14 0430 05/14/14 0659  PHART 7.222* 7.279*  PCO2ART 75.2* 67.9*  PO2ART 110.0* 61.5*   Liver Enzymes  Recent Labs Lab 05/12/14 1446 05/13/14 0445 05/14/14 0533  AST 15 12 15   ALT 12 9 10   ALKPHOS 86 63  77  BILITOT 0.7 0.3 0.3  ALBUMIN 3.6 2.5* 3.2*   Cardiac Enzymes  Recent Labs Lab 05/12/14 1316 05/12/14 1623 05/14/14 0533  TROPONINI  --  <0.30  --   PROBNP 577.4*  --  1351.0*   Glucose No results for input(s): GLUCAP in the last 168 hours.  Imaging No results found.   ASSESSMENT / PLAN:  PULMONARY OETT A: Acute on chronic hypercarbic , hypoxic resp failure secondary to narcotics in patient with severe COPD, chronic hypercarbia, on going tobacco abuse. Suspected aspiration(low threshold to dc abx) P:   DC narcotics BD's O2 as needed for O2 sat 88-90% PRN NIMVS  CARDIOVASCULAR CVL A:  HTN CHF P:  Antihypertensives Diuretics   RENAL A:   Hyponatremia P:   Monitor Avoid hypotonic fluids  GASTROINTESTINAL A:   Vomiting x 1(presumed reaction to narcotics) P:   Antiemetics as needed  HEMATOLOGIC A:   No acute issue P:    INFECTIOUS A:   Presumed aspiration(rll)(low threshold to dc abx) Elevated wbc but just completed steroid taper P:   BCx2 11/25>>  Abx: 11/25 cefepime>> 11/25 vanco>>  ENDOCRINE A:   No acute issue P:     NEUROLOGIC A:   Transient lethargy secondary to narcotics compounded by hypercarbia. Anxiety P:   RASS goal: 1 Hold sedation Use Non narcotic analgesics No sleeping pills Hold anxiolytics for now  FAMILY  - Updates:   - Inter-disciplinary family meet or Palliative Care meeting due by:  day 7    TODAY'S SUMMARY:  73 yo active smoker(1 ppd) despite needing PRN O2 at home and frequent exacerbations of COPD who presented 11-23 with chest and back pain(negative trop/increcreased T6 compression fx) and was treated with PO/IV MSO4 and subsequently 0200 11/25 she developed hypercarbic /hypoxic respiratory failure and required NIMVS and and transfer to ICU. She responded to interventions and NIMVS was discontinued at 0920. All narcotics have been discontinued and PCCM has evaluated.     Richardson Landry Minor ACNP Maryanna Shape PCCM Pager [phone] till 3 pm If no answer page [phone] 05/14/2014, 9:10 AM    Attending Note I have examined patient, reviewed labs, studies, notes. I have discussed teh case with S Minor and agree with the data and plans as amended above. She has COPD with prolonged expiratory phase, no overt wheeze this am. She developed hypoxic / hypercapnic resp failure after receiving narcotics. She is now alert and off BiPAP. Empiric abx for increased interstitial infiltrates (? Edema).   Baltazar Apo, MD, PhD 05/14/2014, 1:22 PM [facility] [phone] or if no answer [phone]

## 2014-05-14 NOTE — Evaluation (Addendum)
Received call about pt obtunded, hypoxic on floor on NRB.  Admitted for back pain 2/2 T6 compression fracture Baseline COPD and combined diastolic/systolic heart failure Transferred to ICU  ABG shows decompensated resp acidosis.  ABG    Component Value Date/Time   PHART 7.222* 05/14/2014 0430   PCO2ART 75.2* 05/14/2014 0430   PO2ART 110.0* 05/14/2014 0430   HCO3 29.7* 05/14/2014 0430   TCO2 27.7 05/14/2014 0430   O2SAT 97.7 05/14/2014 0430     Placed on BiPAP by RT  -Pt responsive at bedside. -O2 sats in upper 90s - diffuse coarse breath sounds on exam-BiPAP in place -Discussed case w/ ELINK  Check CXR BMET, pro BNP  Cont BiPAP  Recheck ABG in 1 hour   Update (6 am)  CXR w/ new R basilar infiltrate- PNA vs. Aspiration pneumonitis + pulm edema Discussed case w/ e link (deterding) Tolerating BiPAP well  Vanc and cefepime (zosyn allergy) Blood cultures Lactate + 2.5 L  20mg  IV lasix x1 SLIV Repeat ABG x1 now.

## 2014-05-15 ENCOUNTER — Inpatient Hospital Stay (HOSPITAL_COMMUNITY): Payer: Medicare Other

## 2014-05-15 DIAGNOSIS — I5043 Acute on chronic combined systolic (congestive) and diastolic (congestive) heart failure: Secondary | ICD-10-CM

## 2014-05-15 DIAGNOSIS — J9621 Acute and chronic respiratory failure with hypoxia: Secondary | ICD-10-CM

## 2014-05-15 DIAGNOSIS — S22009S Unspecified fracture of unspecified thoracic vertebra, sequela: Secondary | ICD-10-CM

## 2014-05-15 DIAGNOSIS — M4854XD Collapsed vertebra, not elsewhere classified, thoracic region, subsequent encounter for fracture with routine healing: Secondary | ICD-10-CM

## 2014-05-15 DIAGNOSIS — E871 Hypo-osmolality and hyponatremia: Secondary | ICD-10-CM

## 2014-05-15 DIAGNOSIS — J9622 Acute and chronic respiratory failure with hypercapnia: Secondary | ICD-10-CM

## 2014-05-15 DIAGNOSIS — M546 Pain in thoracic spine: Secondary | ICD-10-CM

## 2014-05-15 DIAGNOSIS — K567 Ileus, unspecified: Secondary | ICD-10-CM

## 2014-05-15 LAB — CBC WITH DIFFERENTIAL/PLATELET
BASOS PCT: 0 % (ref 0–1)
Basophils Absolute: 0 10*3/uL (ref 0.0–0.1)
EOS ABS: 0 10*3/uL (ref 0.0–0.7)
EOS PCT: 0 % (ref 0–5)
HCT: 34.1 % — ABNORMAL LOW (ref 36.0–46.0)
HEMOGLOBIN: 11.1 g/dL — AB (ref 12.0–15.0)
LYMPHS ABS: 1.4 10*3/uL (ref 0.7–4.0)
Lymphocytes Relative: 15 % (ref 12–46)
MCH: 32.4 pg (ref 26.0–34.0)
MCHC: 32.6 g/dL (ref 30.0–36.0)
MCV: 99.4 fL (ref 78.0–100.0)
MONO ABS: 0.7 10*3/uL (ref 0.1–1.0)
MONOS PCT: 8 % (ref 3–12)
Neutro Abs: 7.3 10*3/uL (ref 1.7–7.7)
Neutrophils Relative %: 77 % (ref 43–77)
Platelets: 212 10*3/uL (ref 150–400)
RBC: 3.43 MIL/uL — AB (ref 3.87–5.11)
RDW: 12.6 % (ref 11.5–15.5)
WBC: 9.5 10*3/uL (ref 4.0–10.5)

## 2014-05-15 LAB — BASIC METABOLIC PANEL
Anion gap: 10 (ref 5–15)
BUN: 10 mg/dL (ref 6–23)
CALCIUM: 9.2 mg/dL (ref 8.4–10.5)
CHLORIDE: 90 meq/L — AB (ref 96–112)
CO2: 31 meq/L (ref 19–32)
CREATININE: 0.47 mg/dL — AB (ref 0.50–1.10)
GFR calc Af Amer: >90 mL/min (ref >90)
GFR calc non Af Amer: >90 mL/min (ref >90)
GLUCOSE: 94 mg/dL (ref 70–99)
Potassium: 4.8 mEq/L (ref 3.7–5.3)
Sodium: 131 mEq/L — ABNORMAL LOW (ref 137–147)

## 2014-05-15 LAB — MAGNESIUM: Magnesium: 1.7 mg/dL (ref 1.5–2.5)

## 2014-05-15 LAB — BLOOD GAS, ARTERIAL
ACID-BASE EXCESS: 6.1 mmol/L — AB (ref 0.0–2.0)
Bicarbonate: 32.4 mEq/L — ABNORMAL HIGH (ref 20.0–24.0)
Delivery systems: POSITIVE
EXPIRATORY PAP: 5
FIO2: 0.4 %
INSPIRATORY PAP: 10
MODE: POSITIVE
O2 Saturation: 95.4 %
PCO2 ART: 58.2 mmHg — AB (ref 35.0–45.0)
PO2 ART: 75.1 mmHg — AB (ref 80.0–100.0)
Patient temperature: 37.2
TCO2: 29.7 mmol/L (ref 0–100)
pH, Arterial: 7.365 (ref 7.350–7.450)

## 2014-05-15 LAB — HEMOGLOBIN AND HEMATOCRIT, BLOOD
HCT: 36.6 % (ref 36.0–46.0)
HEMOGLOBIN: 12 g/dL (ref 12.0–15.0)

## 2014-05-15 LAB — TROPONIN I: Troponin I: <0.3 ng/mL (ref <0.30)

## 2014-05-15 LAB — PHOSPHORUS: Phosphorus: 3.3 mg/dL (ref 2.3–4.6)

## 2014-05-15 MED ORDER — LIP MEDEX EX OINT
TOPICAL_OINTMENT | CUTANEOUS | Status: AC
  Filled 2014-05-15: qty 7

## 2014-05-15 MED ORDER — MAGNESIUM SULFATE 2 GM/50ML IV SOLN
2.0000 g | Freq: Once | INTRAVENOUS | Status: AC
  Administered 2014-05-15: 2 g via INTRAVENOUS
  Filled 2014-05-15: qty 50

## 2014-05-15 MED ORDER — ACETAMINOPHEN 10 MG/ML IV SOLN
1000.0000 mg | Freq: Four times a day (QID) | INTRAVENOUS | Status: AC | PRN
  Administered 2014-05-15 – 2014-05-16 (×2): 1000 mg via INTRAVENOUS
  Filled 2014-05-15 (×5): qty 100

## 2014-05-15 MED ORDER — FENTANYL CITRATE 0.05 MG/ML IJ SOLN
12.5000 ug | INTRAMUSCULAR | Status: DC | PRN
  Administered 2014-05-15 – 2014-05-16 (×9): 12.5 ug via INTRAVENOUS
  Filled 2014-05-15 (×9): qty 2

## 2014-05-15 MED ORDER — CLONAZEPAM 0.5 MG PO TABS
0.5000 mg | ORAL_TABLET | Freq: Once | ORAL | Status: AC
  Administered 2014-05-15: 0.5 mg via ORAL
  Filled 2014-05-15: qty 1

## 2014-05-15 MED ORDER — BISACODYL 10 MG RE SUPP
10.0000 mg | Freq: Every day | RECTAL | Status: DC | PRN
  Administered 2014-05-16 – 2014-05-19 (×2): 10 mg via RECTAL
  Filled 2014-05-15 (×2): qty 1

## 2014-05-15 MED ORDER — FENTANYL CITRATE 0.05 MG/ML IJ SOLN
INTRAMUSCULAR | Status: AC
  Administered 2014-05-15: 12.5 ug
  Filled 2014-05-15: qty 2

## 2014-05-15 MED ORDER — CLONAZEPAM 0.5 MG PO TABS: 0.5000 mg | ORAL_TABLET | Freq: Every day | ORAL | Status: DC

## 2014-05-15 MED ORDER — SODIUM CHLORIDE 0.9 % IV SOLN: INTRAVENOUS | Status: DC

## 2014-05-15 MED ORDER — CARBIDOPA-LEVODOPA 25-100 MG PO TABS
0.5000 | ORAL_TABLET | Freq: Four times a day (QID) | ORAL | Status: DC
  Administered 2014-05-15: 0.5
  Filled 2014-05-15 (×6): qty 0.5

## 2014-05-15 NOTE — Consult Note (Signed)
PULMONARY / CRITICAL CARE MEDICINE   Name: Jill White MRN: [id number] DOB: 09-01-1940    ADMISSION DATE:  05/12/2014 CONSULTATION DATE:  11/25  REFERRING MD :  [facility]  CHIEF COMPLAINT: Back pain  INITIAL PRESENTATION:  73 yo active smoker(1 ppd) despite needing PRN O2 at home and frequent exacerbations of COPD who presented 11-23 with chest and back pain(negative trop/increcreased T6 compression fx) and was treated with PO/IV MSO4 and subsequently 0200 11/25 she developed hypercarbic /hypoxic respiratory failure and required NIMVS and and transfer to ICU. She responded to interventions and NIMVS was discontinued at 0920. All narcotics have been discontinued and PCCM has evaluated.    SIGNIFICANT EVENTS: 11/25 hypercarbic resp failure    SUBJECTIVE/OVERNIGHT/INTERVAL HX 11//26/15 - more awake than yesterday after wearning bipap last night but again refusing bipap for tonight due to claustrophobia.  Having lot of pain related to T6 fracture but scared of opioids due to resp depression. She and grandaughter struggling with all symptoms and balancing therapy with side effects. Reluctantly open to  palliative care input  But only for symptom mgmt  Last opioid > 36h ago    VITAL SIGNS: Temp:  [98.1 F (36.7 C)-100.3 F (37.9 C)] 99.1 F (37.3 C) (11/26 0734) Pulse Rate:  [67-100] 81 (11/26 0734) Resp:  [16-28] 21 (11/26 1100) BP: (86-186)/(29-66) 136/39 mmHg (11/26 0734) SpO2:  [84 %-99 %] 94 % (11/26 1100) FiO2 (%):  [40 %] 40 % (11/26 0757) Weight:  [62 kg (136 lb 11 oz)] 62 kg (136 lb 11 oz) (11/25 1632) HEMODYNAMICS:   VENTILATOR SETTINGS: Vent Mode:  [-]  FiO2 (%):  [40 %] 40 % INTAKE / OUTPUT:  Intake/Output Summary (Last 24 hours) at 05/15/14 1108 Last data filed at 05/15/14 1100  Gross per 24 hour  Intake    490 ml  Output    915 ml  Net   -425 ml    PHYSICAL EXAMINATION: General:  Awake and alert WM NAD at rest . VERY DECONDITIONED Neuro:  Intact. IN PAIN  +  HEENT:  No JVD /LAN Cardiovascular:  HSR RRR Lungs:  Diminished bs, no exp wheeze Abdomen:  Soft, NT, +bs Musculoskeletal:  Intact Skin:  Warm and dry  LABS: PULMONARY  Recent Labs Lab 05/12/14 1404 05/14/14 0430 05/14/14 0659 05/15/14 0500  PHART  --  7.222* 7.279* 7.365  PCO2ART  --  75.2* 67.9* 58.2*  PO2ART  --  110.0* 61.5* 75.1*  HCO3  --  29.7* 30.8* 32.4*  TCO2 32 27.7 28.5 29.7  O2SAT  --  97.7 89.7 95.4    CBC  Recent Labs Lab 05/13/14 0445 05/14/14 0645 05/15/14 0219  HGB 11.5* 12.1 11.1*  HCT 34.8* 37.7 34.1*  WBC 6.6 13.8* 9.5  PLT 270 290 212    COAGULATION  Recent Labs Lab 05/12/14 1446  INR 0.96    CARDIAC   Recent Labs Lab 05/12/14 1623 05/14/14 1422 05/14/14 2041 05/15/14 0219  TROPONINI <0.30 <0.30 <0.30 <0.30    Recent Labs Lab 05/12/14 1316 05/14/14 0533  PROBNP 577.4* 1351.0*     CHEMISTRY  Recent Labs Lab 05/12/14 1344 05/12/14 1404 05/12/14 1446 05/13/14 0445 05/14/14 0533 05/15/14 0219  NA HEMOLYZED SPECIMEN, RESULTS MAY BE AFFECTED 129* 131* 140 131* 131*  K HEMOLYZED SPECIMEN, RESULTS MAY BE AFFECTED 5.9* 4.0 3.6* 5.0 4.8  CL HEMOLYZED SPECIMEN, RESULTS MAY BE AFFECTED 94* 90* 105 90* 90*  CO2 HEMOLYZED SPECIMEN, RESULTS MAY BE AFFECTED  --  29 26  31 31  GLUCOSE HEMOLYZED SPECIMEN, RESULTS MAY BE AFFECTED 102* 102* 119* 128* 94  BUN HEMOLYZED SPECIMEN, RESULTS MAY BE AFFECTED 11 9 15 11 10   CREATININE HEMOLYZED SPECIMEN, RESULTS MAY BE AFFECTED 0.50 0.47* 0.67 0.53 0.47*  CALCIUM HEMOLYZED SPECIMEN, RESULTS MAY BE AFFECTED  --  9.6 6.9* 9.3 9.2  MG  --   --   --  1.4*  --  1.7  PHOS  --   --   --   --   --  3.3   Estimated Creatinine Clearance: 52.7 mL/min (by C-G formula based on Cr of 0.47).   LIVER  Recent Labs Lab 05/12/14 1344 05/12/14 1446 05/13/14 0445 05/14/14 0533  AST HEMOLYZED SPECIMEN, RESULTS MAY BE AFFECTED 15 12 15   ALT HEMOLYZED SPECIMEN, RESULTS MAY BE AFFECTED 12 9 10    ALKPHOS HEMOLYZED SPECIMEN, RESULTS MAY BE AFFECTED 86 63 77  BILITOT HEMOLYZED SPECIMEN, RESULTS MAY BE AFFECTED 0.7 0.3 0.3  PROT HEMOLYZED SPECIMEN, RESULTS MAY BE AFFECTED 7.2 5.2* 7.0  ALBUMIN HEMOLYZED SPECIMEN, RESULTS MAY BE AFFECTED 3.6 2.5* 3.2*  INR  --  0.96  --   --      INFECTIOUS  Recent Labs Lab 05/14/14 0645  LATICACIDVEN 0.8     ENDOCRINE CBG (last 3)  No results for input(s): GLUCAP in the last 72 hours.       IMAGING x48h Dg Abd 1 View  05/14/2014   CLINICAL DATA:  Generalized abdominal pain, nausea and vomiting  EXAM: ABDOMEN - 1 VIEW  COMPARISON:  05/12/2014 and 6/6/ 15  FINDINGS: There is gaseous distended viscus in mid and left abdomen measures at least 9.6 cm in diameter. This is highly suspicious for segmental gaseous distension of probable transverse colon. Colonic ileus or colonic obstruction cannot be excluded. Some gas noted within stomach.  IMPRESSION: Gaseous distended viscus in mid and left abdomen measuring at least 9.6 cm in diameter highly suspicious for colonic segmental distension. Highly suspicious for segmental ileus or colonic obstruction. Clinical correlation is necessary.   Electronically Signed   By: Lahoma Crocker M.D.   On: 05/14/2014 10:10   Dg Chest Port 1 View  05/15/2014   CLINICAL DATA:  Hypoxemia, smoker, hypertension  EXAM: PORTABLE CHEST - 1 VIEW  COMPARISON:  05/14/2014  FINDINGS: NG tube within the stomach, tip not visualized. Stable cardiomegaly with central vascular congestion. Persistent right base collapse/ consolidation obscuring the hemidiaphragm with likely an associated effusion. Right base pneumonia not excluded. Stable left lung aeration. No pneumothorax. Atherosclerosis of the aorta.  IMPRESSION: Persistent right base collapse/consolidation and likely small effusion. Pneumonia not excluded.   Electronically Signed   By: Daryll Brod M.D.   On: 05/15/2014 10:55   Dg Chest Port 1 View  05/14/2014   CLINICAL DATA:   Hypoxia  EXAM: PORTABLE CHEST - 1 VIEW  COMPARISON:  05/12/2014  FINDINGS: Stable cardiomegaly and aortic tortuosity.  There is interstitial coarsening which is increased from prior, with subtle [employer]. There is a new airspace opacity at the right base. No effusion or pneumothorax.  IMPRESSION: 1. New right basilar opacity suspicious for pneumonia or aspiration pneumonitis. 2. Developing pulmonary edema.   Electronically Signed   By: Jorje Guild M.D.   On: 05/14/2014 05:38   Dg Abd Portable 1v  05/15/2014   CLINICAL DATA:  Abdominal pain.  Question ileus.  EXAM: PORTABLE ABDOMEN - 1 VIEW  COMPARISON:  CT chest and abdomen 05/12/2014. Single view of the abdomen 11/23/2013  and 05/14/2014.  FINDINGS: Gaseous distention of a loop of bowel in the left upper quadrant is again seen. The loop measures 7.8 cm today compared to 9.6 cm yesterday. Basilar view of comparison CT, this is a loop of small bowel. The examination is otherwise unremarkable.  IMPRESSION: Mild decrease in gaseous distention of a loop of small bowel in the left upper quadrant of the abdomen.   Electronically Signed   By: Inge Rise M.D.   On: 05/15/2014 10:16       ASSESSMENT / PLAN:  PULMONARY OETT A: Acute on chronic hypercarbic , hypoxic resp failure secondary to narcotics in patient with severe COPD, chronic hypercarbia, on going tobacco abuse. Suspected aspiration(low threshold to dc abx)    05/15/14: more awake and improved abg after nocturnal bipap but struggling with pain. Last opioid x 36h ago but still tenuous   P:   Nocturnal bipap - encouraged NEbs Avoiding po steroids - she says she is "allergic" but ok for IV steroids Low dose prn fent for pain  CARDIOVASCULAR CVL A:  HTN CHF P:  Antihypertensives Diuretics   RENAL A:   Hyponatremia  - mild Low Mag   P:   Monitor Avoid hypotonic fluids Replete mag  GASTROINTESTINAL A:   Vomiting x 1(presumed reaction to narcotics)   -  05/15/14 - NPO with NG tube P:   Antiemetics as needed MGMt per [facility]  HEMATOLOGIC A:   No acute issue P:  PRBC < 7gm%    INFECTIOUS A:   Presumed aspiration(rll)(low threshold to dc abx) Elevated wbc but just completed steroid taper P:   BCx2 11/25>>  Abx: 11/25 cefepime>> 11/25 vanco>>  ENDOCRINE A:   No acute issue P:     NEUROLOGIC A:   Transient lethargy secondary to narcotics compounded by hypercarbia. Anxiety     - 05/15/14 - struggling with severe back pain and in need of opioids but reluctant to take any due to resp issues. Unable to take po  P:   Tylenol IV prn Try fent lowest dose prn  RASS goal:  Anything greater or better than -2 Hold sedation otherwise No sleeping pills Hold anxiolytics for now  FAMILY  - Updates: patient and granduathter updated at bedside. Offered pallaitive care for symptom mgmt but wants to hold off  - Inter-disciplinary family meet or Palliative Care meeting due by:  day 7     Dr. Brand Males, M.D., [facility].C.P Pulmonary and Critical Care Medicine Staff Physician [place] [facility] Pager: [phone], If no answer or between  15:00h - 7:00h: call [phone]  05/15/2014 11:19 AM

## 2014-05-15 NOTE — Progress Notes (Signed)
Paged [facility], Pt is complaining of restless leg syndrome and anxiety. Pt's home med Klonopin 0.5mg  restarted, Sinemet added for anxiety.

## 2014-05-15 NOTE — Progress Notes (Signed)
Subjective: No acute events.  Objective: Vital signs in last 24 hours: Temp:  [98.1 F (36.7 C)-100.3 F (37.9 C)] 99.1 F (37.3 C) (11/26 0734) Pulse Rate:  [67-100] 81 (11/26 0734) Resp:  [16-28] 22 (11/26 0734) BP: (86-186)/(29-66) 136/39 mmHg (11/26 0734) SpO2:  [84 %-99 %] 93 % (11/26 0804) FiO2 (%):  [40 %] 40 % (11/26 0757) Weight:  [62 kg (136 lb 11 oz)] 62 kg (136 lb 11 oz) (11/25 1632) Last BM Date: 05/12/14  Intake/Output from previous day: 11/25 0701 - 11/26 0700 In: 450 [I.V.:120; NG/GT:30; IV Piggyback:300] Out: 1395 [Urine:1395] Intake/Output this shift:    General appearance: uncomfortable appearing GI: decreased distension, no pain with palpation today  Lab Results:  Recent Labs  05/13/14 0445 05/14/14 0645 05/15/14 0219  WBC 6.6 13.8* 9.5  HGB 11.5* 12.1 11.1*  HCT 34.8* 37.7 34.1*  PLT 270 290 212   BMET  Recent Labs  05/13/14 0445 05/14/14 0533 05/15/14 0219  NA 140 131* 131*  K 3.6* 5.0 4.8  CL 105 90* 90*  CO2 26 31 31   GLUCOSE 119* 128* 94  BUN 15 11 10   CREATININE 0.67 0.53 0.47*  CALCIUM 6.9* 9.3 9.2   LFT  Recent Labs  05/14/14 0533  PROT 7.0  ALBUMIN 3.2*  AST 15  ALT 10  ALKPHOS 77  BILITOT 0.3   PT/INR  Recent Labs  05/12/14 1446  LABPROT 12.9  INR 0.96   Hepatitis Panel No results for input(s): HEPBSAG, HCVAB, HEPAIGM, HEPBIGM in the last 72 hours. C-Diff No results for input(s): CDIFFTOX in the last 72 hours. Fecal Lactopherrin No results for input(s): FECLLACTOFRN in the last 72 hours.  Studies/Results: Dg Abd 1 View  05/14/2014   CLINICAL DATA:  Generalized abdominal pain, nausea and vomiting  EXAM: ABDOMEN - 1 VIEW  COMPARISON:  05/12/2014 and 6/6/ 15  FINDINGS: There is gaseous distended viscus in mid and left abdomen measures at least 9.6 cm in diameter. This is highly suspicious for segmental gaseous distension of probable transverse colon. Colonic ileus or colonic obstruction cannot be excluded.  Some gas noted within stomach.  IMPRESSION: Gaseous distended viscus in mid and left abdomen measuring at least 9.6 cm in diameter highly suspicious for colonic segmental distension. Highly suspicious for segmental ileus or colonic obstruction. Clinical correlation is necessary.   Electronically Signed   By: Lahoma Crocker M.D.   On: 05/14/2014 10:10   Dg Chest Port 1 View  05/14/2014   CLINICAL DATA:  Hypoxia  EXAM: PORTABLE CHEST - 1 VIEW  COMPARISON:  05/12/2014  FINDINGS: Stable cardiomegaly and aortic tortuosity.  There is interstitial coarsening which is increased from prior, with subtle [employer]. There is a new airspace opacity at the right base. No effusion or pneumothorax.  IMPRESSION: 1. New right basilar opacity suspicious for pneumonia or aspiration pneumonitis. 2. Developing pulmonary edema.   Electronically Signed   By: Jorje Guild M.D.   On: 05/14/2014 05:38    Medications:  Scheduled: . acetaminophen  1,000 mg Oral Q8H  . antiseptic oral rinse  7 mL Mouth Rinse BID  . aspirin  81 mg Oral q morning - 10a  . budesonide-formoterol  2 puff Inhalation BID  . calcitonin (salmon)  1 spray Alternating Nares Daily  . Carbidopa-Levodopa ER  1 tablet Oral BID  . ceFEPime (MAXIPIME) IV  1 g Intravenous Q12H  . clonazePAM  0.5 mg Oral QHS  . diltiazem  120 mg Oral Daily  .  heparin  5,000 Units Subcutaneous 3 times per day  . lip balm      . nicotine  14 mg Transdermal Daily  . pantoprazole (PROTONIX) IV  40 mg Intravenous Q12H  . senna  1 tablet Oral BID  . sodium chloride  3 mL Intravenous Q12H  . tiotropium  18 mcg Inhalation Daily  . vancomycin  500 mg Intravenous BID   Continuous:   Assessment/Plan: 1) Small bowel ileus. 2) COPD.   The patient appears improved with the physical examination.  Her KUB is pending at this time.  If there is an improvement the NG tube can be removed in the near future.  Plan: 1) Await KUB. 2) Continue with supportive care.   LOS: 3 days    Sheriff Rodenberg D 05/15/2014, 9:02 AM

## 2014-05-15 NOTE — Progress Notes (Addendum)
[facility] PROGRESS NOTE  Jill White ANV:[id number] DOB: [birth year] DOA: 05/12/2014 PCP: Laurey Morale, MD  Assessment/Plan: 1-Acute hypercapnic, hypoxic Respiratory Failure;  Secondary to oversedation, PNA.  Continue with BIPAP PRN.   Continue with Vancomycin and cefepime.  Chest x ray with evidence of PNA.  Speech evaluation.  CCM following.  Patient alert this morning. Following command.  Repeat chest x ray.   2-Ileus, vs colonic obstruction.  KUB pending this morning.  dulcolax suppository.   3-COPD: Continue with Spiriva, and Symbicort.   4-Acute on Chronic systolic, diastolic HF exacerbation.  Hold Cozaar, continue with Cardizem.  Hold lasix today due to NPO status.  Repeat Chest X-ray.   5-Chest Pain, Back pain:  Worsening T 6 Compression fracture:  Patient relates back pain, chest pain. She had Ct angio 11-23 which was negative for PE.  Troponin times 3 negative. Patient was evaluated by cardiology 11-23 who thought chest pain was neuropathic pain.  Will defer to CCM ordering fentanyl for pain controlled.  I spoke with Dr Ronnald Ramp, he will see patient in consultation.   6-DVT prophylaxis; Heparin.   Code Status: Full Code.  Family Communication: Care discussed with granddaughter.  Disposition Plan: Remain ICU.    Consultants:  CCM.   Procedures:  none  Antibiotics: Cefepime 11-25 Vancomycin 11-25   HPI/Subjective: Patient is alert, in no distress. Relates back pain, chest pain 8/10. Breathing ok. She was able to sleep last night.     Objective: Filed Vitals:   05/15/14 0734  BP: 136/39  Pulse: 81  Temp: 99.1 F (37.3 C)  Resp: 22    Intake/Output Summary (Last 24 hours) at 05/15/14 0825 Last data filed at 05/15/14 0700  Gross per 24 hour  Intake    350 ml  Output   1245 ml  Net   -895 ml   Filed Weights   05/12/14 2016 05/14/14 1632  Weight: 59.24 kg (130 lb 9.6 oz) 62 kg (136 lb 11 oz)    Exam:   General:  Alert,  awake. NG tube in place.   Cardiovascular: S 1, S 2 RRR, systolic  murmur.   Respiratory: Bilateral air movement, bilateral crackles.   Abdomen: BS decrease, NT, mild distended. No rigidity.   Musculoskeletal: no edema.   Data Reviewed: Basic Metabolic Panel:  Recent Labs Lab 05/12/14 1344 05/12/14 1404 05/12/14 1446 05/13/14 0445 05/14/14 0533 05/15/14 0219  NA HEMOLYZED SPECIMEN, RESULTS MAY BE AFFECTED 129* 131* 140 131* 131*  K HEMOLYZED SPECIMEN, RESULTS MAY BE AFFECTED 5.9* 4.0 3.6* 5.0 4.8  CL HEMOLYZED SPECIMEN, RESULTS MAY BE AFFECTED 94* 90* 105 90* 90*  CO2 HEMOLYZED SPECIMEN, RESULTS MAY BE AFFECTED  --  29 26 31 31   GLUCOSE HEMOLYZED SPECIMEN, RESULTS MAY BE AFFECTED 102* 102* 119* 128* 94  BUN HEMOLYZED SPECIMEN, RESULTS MAY BE AFFECTED 11 9 15 11 10   CREATININE HEMOLYZED SPECIMEN, RESULTS MAY BE AFFECTED 0.50 0.47* 0.67 0.53 0.47*  CALCIUM HEMOLYZED SPECIMEN, RESULTS MAY BE AFFECTED  --  9.6 6.9* 9.3 9.2  MG  --   --   --  1.4*  --  1.7  PHOS  --   --   --   --   --  3.3   Liver Function Tests:  Recent Labs Lab 05/12/14 1344 05/12/14 1446 05/13/14 0445 05/14/14 0533  AST HEMOLYZED SPECIMEN, RESULTS MAY BE AFFECTED 15 12 15   ALT HEMOLYZED SPECIMEN, RESULTS MAY BE AFFECTED 12 9 10   ALKPHOS HEMOLYZED SPECIMEN, RESULTS MAY BE  AFFECTED 86 63 77  BILITOT HEMOLYZED SPECIMEN, RESULTS MAY BE AFFECTED 0.7 0.3 0.3  PROT HEMOLYZED SPECIMEN, RESULTS MAY BE AFFECTED 7.2 5.2* 7.0  ALBUMIN HEMOLYZED SPECIMEN, RESULTS MAY BE AFFECTED 3.6 2.5* 3.2*    Recent Labs Lab 05/12/14 1344 05/12/14 1446  LIPASE 20 14   No results for input(s): AMMONIA in the last 168 hours. CBC:  Recent Labs Lab 05/12/14 1344 05/12/14 1404 05/13/14 0445 05/14/14 0645 05/15/14 0219  WBC 12.8*  --  6.6 13.8* 9.5  NEUTROABS 8.5*  --   --  11.6* 7.3  HGB 15.2* 18.4* 11.5* 12.1 11.1*  HCT 43.9 54.0* 34.8* 37.7 34.1*  MCV 95.0  --  99.1 100.3* 99.4  PLT 364  --  270 290 212    Cardiac Enzymes:  Recent Labs Lab 05/12/14 1623 05/14/14 1422 05/14/14 2041 05/15/14 0219  TROPONINI <0.30 <0.30 <0.30 <0.30   BNP (last 3 results)  Recent Labs  03/26/14 1005 05/12/14 1316 05/14/14 0533  PROBNP 94.0 577.4* 1351.0*   CBG: No results for input(s): GLUCAP in the last 168 hours.  Recent Results (from the past 240 hour(s))  MRSA PCR Screening     Status: None   Collection Time: 05/14/14  4:54 AM  Result Value Ref Range Status   MRSA by PCR NEGATIVE NEGATIVE Final    Comment:        The GeneXpert MRSA Assay (FDA approved for NASAL specimens only), is one component of a comprehensive MRSA colonization surveillance program. It is not intended to diagnose MRSA infection nor to guide or monitor treatment for MRSA infections.      Studies: Dg Abd 1 View  05/14/2014   CLINICAL DATA:  Generalized abdominal pain, nausea and vomiting  EXAM: ABDOMEN - 1 VIEW  COMPARISON:  05/12/2014 and 6/6/ 15  FINDINGS: There is gaseous distended viscus in mid and left abdomen measures at least 9.6 cm in diameter. This is highly suspicious for segmental gaseous distension of probable transverse colon. Colonic ileus or colonic obstruction cannot be excluded. Some gas noted within stomach.  IMPRESSION: Gaseous distended viscus in mid and left abdomen measuring at least 9.6 cm in diameter highly suspicious for colonic segmental distension. Highly suspicious for segmental ileus or colonic obstruction. Clinical correlation is necessary.   Electronically Signed   By: Lahoma Crocker M.D.   On: 05/14/2014 10:10   Dg Chest Port 1 View  05/14/2014   CLINICAL DATA:  Hypoxia  EXAM: PORTABLE CHEST - 1 VIEW  COMPARISON:  05/12/2014  FINDINGS: Stable cardiomegaly and aortic tortuosity.  There is interstitial coarsening which is increased from prior, with subtle [employer]. There is a new airspace opacity at the right base. No effusion or pneumothorax.  IMPRESSION: 1. New right basilar opacity  suspicious for pneumonia or aspiration pneumonitis. 2. Developing pulmonary edema.   Electronically Signed   By: Jorje Guild M.D.   On: 05/14/2014 05:38    Scheduled Meds: . acetaminophen  1,000 mg Oral Q8H  . antiseptic oral rinse  7 mL Mouth Rinse BID  . aspirin  81 mg Oral q morning - 10a  . budesonide-formoterol  2 puff Inhalation BID  . calcitonin (salmon)  1 spray Alternating Nares Daily  . Carbidopa-Levodopa ER  1 tablet Oral BID  . ceFEPime (MAXIPIME) IV  1 g Intravenous Q12H  . clonazePAM  0.5 mg Oral QHS  . diltiazem  120 mg Oral Daily  . heparin  5,000 Units Subcutaneous 3 times per day  .  lip balm      . nicotine  14 mg Transdermal Daily  . pantoprazole (PROTONIX) IV  40 mg Intravenous Q12H  . senna  1 tablet Oral BID  . sodium chloride  3 mL Intravenous Q12H  . tiotropium  18 mcg Inhalation Daily  . vancomycin  500 mg Intravenous BID   Continuous Infusions: . sodium chloride      Principal Problem:   Non-traumatic compression fracture of T6 thoracic vertebra Active Problems:   Essential hypertension   Hyponatremia   Tobacco abuse   COPD (chronic obstructive pulmonary disease)   Acute respiratory failure   Mild aortic stenosis   Acute on chronic combined systolic and diastolic congestive heart failure, NYHA class 4   Back pain   Chest pain   Compression deformity of vertebra   LBBB (left bundle branch block)   Thoracic spine fracture   Combined systolic and diastolic heart failure   Compression fracture of thoracic spine, non-traumatic   Acute on chronic respiratory failure   PNA (pneumonia)    Time spent: 35 minutes.     Niel Hummer A  [facility] Pager [phone]. If 7PM-7AM, please contact night-coverage at [email or web address], password [facility] 05/15/2014, 8:25 AM  LOS: 3 days

## 2014-05-15 NOTE — Plan of Care (Signed)
Problem: ICU Phase Progression Outcomes Goal: Pain controlled with appropriate interventions Outcome: Progressing Goal: Voiding-avoid urinary catheter unless indicated Outcome: Not Progressing Foley inserted due to urinary retention.

## 2014-05-15 NOTE — Progress Notes (Signed)
[facility] Progress Note Patient Name: Jill White DOB: 01/28/1941 MRN: [id number]   Date of Service  05/15/2014  HPI/Events of Note    eICU Interventions  Low dose clonazepam ordered x 1 for RLS      Intervention Category Intermediate Interventions: Other:  BYRUM,ROBERT S. 05/15/2014, 8:31 PM

## 2014-05-15 NOTE — Progress Notes (Signed)
Patient declines the use of nocturnal BIPAP tonight. RT provided education to both the patient and her grand-daughter, who both understand the need. Patient encouraged to attempt quieter methods by using a bedside box instead of the large critical care machine. She continues to refuse and states she "is not doing that". Both the patient and the family are aware that if lethargy becomes apparent tonight, the BiPAP will be placed upon her. She agrees if she is unresponsive that it may be placed until she is awake again. RN aware.

## 2014-05-15 NOTE — Progress Notes (Addendum)
PT Cancellation Note  Patient Details Name: Jill White MRN: [id number] DOB: 09/23/1940   Cancelled Treatment:    Reason Eval/Treat Not Completed: Pain limiting ability to participate. Spoke with pt and daughter. Pt appeared to defer to daughter who stated pain issue has not been addressed yet-"once all this other BS and pain is taken care of then she will get up with you."   PT will plan to check back on Monday (sooner if needed) to see if pt is willing/able to begin mobilization.    Weston Anna, MPT Pager: [phone]

## 2014-05-16 ENCOUNTER — Inpatient Hospital Stay (HOSPITAL_COMMUNITY): Payer: Medicare Other

## 2014-05-16 DIAGNOSIS — J690 Pneumonitis due to inhalation of food and vomit: Secondary | ICD-10-CM

## 2014-05-16 DIAGNOSIS — Z515 Encounter for palliative care: Secondary | ICD-10-CM | POA: Insufficient documentation

## 2014-05-16 LAB — HEPATIC FUNCTION PANEL
ALBUMIN: 2.6 g/dL — AB (ref 3.5–5.2)
ALT: <5 U/L (ref 0–35)
AST: 14 U/L (ref 0–37)
Alkaline Phosphatase: 62 U/L (ref 39–117)
BILIRUBIN TOTAL: 0.3 mg/dL (ref 0.3–1.2)
Bilirubin, Direct: <0.2 mg/dL (ref 0.0–0.3)
Total Protein: 6.1 g/dL (ref 6.0–8.3)

## 2014-05-16 LAB — LEGIONELLA ANTIGEN, URINE

## 2014-05-16 LAB — CBC WITH DIFFERENTIAL/PLATELET
BASOS PCT: 0 % (ref 0–1)
Basophils Absolute: 0 10*3/uL (ref 0.0–0.1)
Eosinophils Absolute: 0.1 10*3/uL (ref 0.0–0.7)
Eosinophils Relative: 1 % (ref 0–5)
HCT: 35.7 % — ABNORMAL LOW (ref 36.0–46.0)
HEMOGLOBIN: 11.6 g/dL — AB (ref 12.0–15.0)
Lymphocytes Relative: 17 % (ref 12–46)
Lymphs Abs: 1.5 10*3/uL (ref 0.7–4.0)
MCH: 32.3 pg (ref 26.0–34.0)
MCHC: 32.5 g/dL (ref 30.0–36.0)
MCV: 99.4 fL (ref 78.0–100.0)
MONOS PCT: 7 % (ref 3–12)
Monocytes Absolute: 0.6 10*3/uL (ref 0.1–1.0)
Neutro Abs: 6.5 10*3/uL (ref 1.7–7.7)
Neutrophils Relative %: 75 % (ref 43–77)
Platelets: 296 10*3/uL (ref 150–400)
RBC: 3.59 MIL/uL — ABNORMAL LOW (ref 3.87–5.11)
RDW: 12.2 % (ref 11.5–15.5)
WBC: 8.7 10*3/uL (ref 4.0–10.5)

## 2014-05-16 LAB — BASIC METABOLIC PANEL
Anion gap: 11 (ref 5–15)
BUN: 11 mg/dL (ref 6–23)
CHLORIDE: 91 meq/L — AB (ref 96–112)
CO2: 31 meq/L (ref 19–32)
CREATININE: 0.44 mg/dL — AB (ref 0.50–1.10)
Calcium: 8.7 mg/dL (ref 8.4–10.5)
GFR calc Af Amer: >90 mL/min (ref >90)
GFR calc non Af Amer: >90 mL/min (ref >90)
Glucose, Bld: 70 mg/dL (ref 70–99)
Potassium: 4.6 mEq/L (ref 3.7–5.3)
SODIUM: 133 meq/L — AB (ref 137–147)

## 2014-05-16 LAB — MAGNESIUM: Magnesium: 2 mg/dL (ref 1.5–2.5)

## 2014-05-16 LAB — PHOSPHORUS: Phosphorus: 2.6 mg/dL (ref 2.3–4.6)

## 2014-05-16 MED ORDER — DILTIAZEM HCL 30 MG PO TABS
30.0000 mg | ORAL_TABLET | Freq: Four times a day (QID) | ORAL | Status: DC
  Administered 2014-05-16 – 2014-05-18 (×8): 30 mg via ORAL
  Filled 2014-05-16 (×8): qty 1

## 2014-05-16 MED ORDER — CLONAZEPAM 0.5 MG PO TABS
0.5000 mg | ORAL_TABLET | Freq: Once | ORAL | Status: AC
  Administered 2014-05-16: 0.5 mg via ORAL
  Filled 2014-05-16: qty 1

## 2014-05-16 MED ORDER — LIDOCAINE 5 % EX PTCH
1.0000 | MEDICATED_PATCH | CUTANEOUS | Status: DC
  Administered 2014-05-16: 1 via TRANSDERMAL
  Filled 2014-05-16 (×2): qty 1

## 2014-05-16 MED ORDER — HYDRALAZINE HCL 20 MG/ML IJ SOLN
10.0000 mg | Freq: Four times a day (QID) | INTRAMUSCULAR | Status: DC | PRN
  Administered 2014-05-16: 10 mg via INTRAVENOUS
  Filled 2014-05-16: qty 1

## 2014-05-16 MED ORDER — FENTANYL CITRATE 0.05 MG/ML IJ SOLN
12.5000 ug | INTRAMUSCULAR | Status: DC | PRN
  Administered 2014-05-16: 12.5 ug via INTRAVENOUS
  Administered 2014-05-16: 25 ug via INTRAVENOUS
  Administered 2014-05-16: 12.5 ug via INTRAVENOUS
  Administered 2014-05-17: 25 ug via INTRAVENOUS
  Administered 2014-05-17: 12.5 ug via INTRAVENOUS
  Administered 2014-05-17: 25 ug via INTRAVENOUS
  Filled 2014-05-16 (×5): qty 2

## 2014-05-16 NOTE — Progress Notes (Signed)
OT Cancellation Note  Patient Details Name: Jill White MRN: [id number] DOB: [birth year]   Cancelled Treatment:    Reason Eval/Treat Not Completed: Other (comment).  Pt got up with PT this afternoon. Will check back over the weekend, if schedule permits.  Mykell Genao 05/16/2014, 3:25 PM  Lesle Chris, OTR/L [phone] 05/16/2014

## 2014-05-16 NOTE — Progress Notes (Signed)
Pt stated that she didn't want to use BIPAP QHS.  Pt stated that she doesn't need it.  RT to monitor and assess as needed.

## 2014-05-16 NOTE — Evaluation (Addendum)
Physical Therapy Evaluation Patient Details Name: Jill White MRN: [id number] DOB: 03/16/1941 Today's Date: 05/16/2014   History of Present Illness  This is a 73 year old female  who was admitted for chest pain; Pt found to have T6 compression fx (neurosurgery stated not a candidate for VP/KP and bracing unnecessary) ileus and pna;   PMH of HTN, COPD, and PUD   Clinical Impression  Pt will benefit from PT to address deficits below; Pt with participation may be an issue, therefore no f/u recommended, pt does have 24hr assist if needed.    Follow Up Recommendations No PT follow up;Supervision for mobility/OOB    Equipment Recommendations  None recommended by PT    Recommendations for Other Services       Precautions / Restrictions Precautions Precautions: Back Precaution Comments: educated on back precautions but she does not adhere to them      Mobility  Bed Mobility Overal bed mobility: Needs Assistance Bed Mobility: Rolling;Sidelying to Sit;Sit to Supine Rolling: Supervision Sidelying to sit: Min guard   Sit to supine: Min guard   General bed mobility comments: for safety, lines, encouraged log rolling but pt non-compliant  Transfers Overall transfer level: Needs assistance   Transfers: Sit to/from Stand Sit to Stand: Min guard;+2 safety/equipment         General transfer comment: verbal cues for safety; +2 for line management  Ambulation/Gait Ambulation/Gait assistance: Min guard Ambulation Distance (Feet): 80 Feet Assistive device: None Gait Pattern/deviations: Step-through pattern;Decreased step length - left;Decreased weight shift to right;Drifts right/left;Trunk flexed;Narrow base of support     General Gait Details: cues for posture, safety and breathing  Stairs            Wheelchair Mobility    Modified Rankin (Stroke Patients Only)       Balance Overall balance assessment: Needs assistance           Standing balance-Leahy  Scale: Fair Standing balance comment: fair to good, able to walk without AD, refuses device or HHA but is unsteady at times                             Pertinent Vitals/Pain Pain Assessment: 0-10 Pain Score: 8  Pain Location: back Pain Intervention(s): Limited activity within patient's tolerance  Sats 96--86--91% on 4L     Home Living Family/patient expects to be discharged to:: Private residence Living Arrangements: Alone Available Help at Discharge: Family;Available 24 hours/day Type of Home: House (town) Home Access: Level entry     Home Layout: One level Home Equipment: Environmental consultant - 2 wheels (borrowed her friend's walker) Additional Comments: pt reports her dtr and or grand-dtr can provide 24hr care    Prior Function Level of Independence: Independent         Comments: step in shower with built in seat     Hand Dominance        Extremity/Trunk Assessment   Upper Extremity Assessment: Defer to OT evaluation           Lower Extremity Assessment: Generalized weakness         Communication   Communication: No difficulties  Cognition Arousal/Alertness: Awake/alert Behavior During Therapy: Agitated Overall Cognitive Status: Within Functional Limits for tasks assessed                      General Comments      Exercises  Assessment/Plan    PT Assessment    PT Diagnosis Difficulty walking;Generalized weakness   PT Problem List    PT Treatment Interventions     PT Goals (Current goals can be found in the Care Plan section) Acute Rehab PT Goals Patient Stated Goal: to get out of hospital PT Goal Formulation: With patient Time For Goal Achievement: 05/23/14 Potential to Achieve Goals: Good    Frequency     Barriers to discharge        Co-evaluation               End of Session Equipment Utilized During Treatment: Gait belt Activity Tolerance: Treatment limited secondary to medical complications (Comment)  (decr sats to 86%, DOE) Patient left: in bed;with call bell/phone within reach;with family/visitor present Nurse Communication: Mobility status         Time: 7622-6333 PT Time Calculation (min) (ACUTE ONLY): 25 min   Charges:   PT Evaluation $Initial PT Evaluation Tier I: 1 Procedure PT Treatments $Gait Training: 8-22 mins $Therapeutic Activity: 8-22 mins   PT G Codes:          Kalief Kattner 2014/06/10, 3:17 PM

## 2014-05-16 NOTE — Progress Notes (Signed)
Pt stated "That she would like her gall bladder examined due to the pain spasms she is having".  Pt stated "She does not feel the pain is from a fracture in her back and feels that continuing on pain medication is only putting a Band-Aid on a problem that could be fixed". She is not thrilled about the possibility of follow up with a pain clinic but understands the palliative role with pain management.  She expressed concern regarding restless leg syndrome, Clonopin 0.5 mg has been restarted and she did not have any problems tonight.   Pt is far better than last night. Each interaction with pt tonight, Pt has been bright eyed, appreciative with no episodes of lethargy. SPO2 has been maintained with 2L N/C. 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 I found this statement to be true when she mentioned several of the clients (that I know personally)  they serviced until the firm closed.

## 2014-05-16 NOTE — Plan of Care (Signed)
Problem: Phase I Progression Outcomes Goal: Pain controlled with appropriate interventions Outcome: Not Progressing Pt stated "that pain medication is just a "Band-Aide" for the real problem which she feels is not her back Fx'. She stated "her pain is more of a spasm that starts in her back and radiates to her chest and has requested her gall bladder to be examined".

## 2014-05-16 NOTE — Progress Notes (Signed)
PULMONARY / CRITICAL CARE MEDICINE   Name: Jill White MRN: [id number] DOB: Mar 27, 1941    ADMISSION DATE:  05/12/2014 CONSULTATION DATE:  11/25  REFERRING MD :  [facility]  CHIEF COMPLAINT: Back pain  INITIAL PRESENTATION:  73 yo active smoker(1 ppd) despite needing PRN O2 at home and frequent exacerbations of COPD who presented 11-23 with chest and back pain(negative trop/increcreased T6 compression fx) and was treated with PO/IV MSO4 and subsequently 0200 11/25 she developed hypercarbic /hypoxic respiratory failure and required NIMVS and and transfer to ICU. She responded to interventions and NIMVS was discontinued at 0920. All narcotics have been discontinued and PCCM has evaluated.    SIGNIFICANT EVENTS: 11/25 hypercarbic resp failure    SUBJECTIVE/OVERNIGHT/INTERVAL HX Much more awake on decreased narcotics.      VITAL SIGNS: Temp:  [98.2 F (36.8 C)-99.5 F (37.5 C)] 99.3 F (37.4 C) (11/27 1000) Pulse Rate:  [75-94] 83 (11/27 1000) Resp:  [15-28] 28 (11/27 1000) BP: (127-162)/(41-58) 151/54 mmHg (11/27 1000) SpO2:  [87 %-95 %] 89 % (11/27 1000) Weight:  [136 lb 3.9 oz (61.8 kg)] 136 lb 3.9 oz (61.8 kg) (11/27 0536) HEMODYNAMICS:   VENTILATOR SETTINGS:   INTAKE / OUTPUT:  Intake/Output Summary (Last 24 hours) at 05/16/14 1107 Last data filed at 05/16/14 0900  Gross per 24 hour  Intake    720 ml  Output   1625 ml  Net   -905 ml    PHYSICAL EXAMINATION: General:  Awake and alert WM NAD at rest . Neuro:  Intact. Follows commands HEENT:  No JVD /LAN Cardiovascular:  HSR RRR Lungs:  Diminished bs, no exp wheeze Abdomen:  Soft, NT, no bs  NGT-> suction Musculoskeletal:  Intact Skin:  Warm and dry  LABS: PULMONARY  Recent Labs Lab 05/12/14 1404 05/14/14 0430 05/14/14 0659 05/15/14 0500  PHART  --  7.222* 7.279* 7.365  PCO2ART  --  75.2* 67.9* 58.2*  PO2ART  --  110.0* 61.5* 75.1*  HCO3  --  29.7* 30.8* 32.4*  TCO2 32 27.7 28.5 29.7  O2SAT  --   97.7 89.7 95.4    CBC  Recent Labs Lab 05/14/14 0645 05/15/14 0219 05/15/14 1755 05/16/14 0350  HGB 12.1 11.1* 12.0 11.6*  HCT 37.7 34.1* 36.6 35.7*  WBC 13.8* 9.5  --  8.7  PLT 290 212  --  296    COAGULATION  Recent Labs Lab 05/12/14 1446  INR 0.96    CARDIAC    Recent Labs Lab 05/12/14 1623 05/14/14 1422 05/14/14 2041 05/15/14 0219  TROPONINI <0.30 <0.30 <0.30 <0.30    Recent Labs Lab 05/12/14 1316 05/14/14 0533  PROBNP 577.4* 1351.0*     CHEMISTRY  Recent Labs Lab 05/12/14 1446 05/13/14 0445 05/14/14 0533 05/15/14 0219 05/16/14 0350  NA 131* 140 131* 131* 133*  K 4.0 3.6* 5.0 4.8 4.6  CL 90* 105 90* 90* 91*  CO2 29 26 31 31 31   GLUCOSE 102* 119* 128* 94 70  BUN 9 15 11 10 11   CREATININE 0.47* 0.67 0.53 0.47* 0.44*  CALCIUM 9.6 6.9* 9.3 9.2 8.7  MG  --  1.4*  --  1.7 2.0  PHOS  --   --   --  3.3 2.6   Estimated Creatinine Clearance: 52.6 mL/min (by C-G formula based on Cr of 0.44).   LIVER  Recent Labs Lab 05/12/14 1344 05/12/14 1446 05/13/14 0445 05/14/14 0533 05/16/14 0350  AST HEMOLYZED SPECIMEN, RESULTS MAY BE AFFECTED 15 12 15  14  ALT HEMOLYZED SPECIMEN, RESULTS MAY BE AFFECTED 12 9 10  <5  ALKPHOS HEMOLYZED SPECIMEN, RESULTS MAY BE AFFECTED 86 63 77 62  BILITOT HEMOLYZED SPECIMEN, RESULTS MAY BE AFFECTED 0.7 0.3 0.3 0.3  PROT HEMOLYZED SPECIMEN, RESULTS MAY BE AFFECTED 7.2 5.2* 7.0 6.1  ALBUMIN HEMOLYZED SPECIMEN, RESULTS MAY BE AFFECTED 3.6 2.5* 3.2* 2.6*  INR  --  0.96  --   --   --      INFECTIOUS  Recent Labs Lab 05/14/14 0645  LATICACIDVEN 0.8     ENDOCRINE CBG (last 3)  No results for input(s): GLUCAP in the last 72 hours.       IMAGING x48h Dg Chest Port 1 View  05/15/2014   CLINICAL DATA:  Hypoxemia, smoker, hypertension  EXAM: PORTABLE CHEST - 1 VIEW  COMPARISON:  05/14/2014  FINDINGS: NG tube within the stomach, tip not visualized. Stable cardiomegaly with central vascular congestion.  Persistent right base collapse/ consolidation obscuring the hemidiaphragm with likely an associated effusion. Right base pneumonia not excluded. Stable left lung aeration. No pneumothorax. Atherosclerosis of the aorta.  IMPRESSION: Persistent right base collapse/consolidation and likely small effusion. Pneumonia not excluded.   Electronically Signed   By: Daryll Brod M.D.   On: 05/15/2014 10:55   Dg Abd Portable 1v  05/16/2014   CLINICAL DATA:  Ileus  EXAM: PORTABLE ABDOMEN - 1 VIEW  COMPARISON:  05/15/2014  FINDINGS: Nasogastric tube tip in the region of the pylorus.  Decreasing distention of small bowel in the left abdomen, 6-7 cm currently.  Chronic elevation the right diaphragm with superimposed pleural effusion.  The remainder of the abdomen is stable.  IMPRESSION: 1. Focal dilation of small bowel in the left abdomen is persistent, but gradually decreasing. 2. Nasogastric tube tip at the pylorus.   Electronically Signed   By: Jorje Guild M.D.   On: 05/16/2014 05:44   Dg Abd Portable 1v  05/15/2014   CLINICAL DATA:  Abdominal pain.  Question ileus.  EXAM: PORTABLE ABDOMEN - 1 VIEW  COMPARISON:  CT chest and abdomen 05/12/2014. Single view of the abdomen 11/23/2013 and 05/14/2014.  FINDINGS: Gaseous distention of a loop of bowel in the left upper quadrant is again seen. The loop measures 7.8 cm today compared to 9.6 cm yesterday. Basilar view of comparison CT, this is a loop of small bowel. The examination is otherwise unremarkable.  IMPRESSION: Mild decrease in gaseous distention of a loop of small bowel in the left upper quadrant of the abdomen.   Electronically Signed   By: Inge Rise M.D.   On: 05/15/2014 10:16   US Abdomen Limited Ruq  05/16/2014   CLINICAL DATA:  Chest and abdominal pain, hypertension  EXAM: US ABDOMEN LIMITED - RIGHT UPPER QUADRANT  COMPARISON:  CT 05/12/2014  FINDINGS: Gallbladder:  No gallstones or wall thickening visualized. No sonographic Murphy sign noted.   Common bile duct:  Diameter: 2.6 mm, unremarkable  Liver:  6 x 5 mm cyst in segment 7 as seen previously. No other focal lesion identified. Within normal limits in parenchymal echogenicity.  Right pleural effusion incidentally noted.  IMPRESSION: 1. Negative gallbladder. 2. Right pleural effusion.   Electronically Signed   By: Arne Cleveland M.D.   On: 05/16/2014 09:42       ASSESSMENT / PLAN:  PULMONARY OETT A: Acute on chronic hypercarbic , hypoxic resp failure secondary to narcotics in patient with severe COPD, chronic hypercarbia, ongoing tobacco abuse. > improved Suspected aspiration(low threshold to dc abx)  11/27 No resp distress  P:   Nocturnal bipap - encouraged Nebs Avoiding po steroids - she says she is "allergic" but ok for IV steroids  fent for back pain but has ileus but no po pain route available.  CARDIOVASCULAR CVL A:  HTN CHF P:  Antihypertensives Diuretics   RENAL A:   Hyponatremia  - mild Low Mag   P:   Monitor Avoid hypotonic fluids Replete mag as needed  GASTROINTESTINAL A:   Vomiting x 1(presumed reaction to narcotics) Ileus   - 05/15/14 - NPO with NG tube P:   Antiemetics as needed MGM per GI  HEMATOLOGIC A:   No acute issue P:  PRBC < 7gm%    INFECTIOUS A:   Presumed aspiration(rll)(low threshold to dc abx) Elevated wbc but just completed steroid taper P:   BCx2 11/25>>  Abx: 11/25 cefepime>> 11/25 vanco>> Could change to unasyn and change to augmentin when taking PO and treat for 7 day course  ENDOCRINE A:   No acute issue P:     NEUROLOGIC A:   Transient lethargy secondary to narcotics compounded by hypercarbia. Anxiety  -11/27 NAD at rest  P:   Tylenol IV prn Try fent lowest dose prn  Hold sedation otherwise No sleeping pills Hold anxiolytics for now  FAMILY  - Updates: patient and granduathter updated at bedside.    PCCM will be available PRN.   Richardson Landry Minor ACNP [facility] Shape PCCM Pager  [phone] till 3 pm If no answer page [phone] 05/16/2014, 11:08 AM  Attending:  I have seen and examined the patient with nurse practitioner/resident and agree with the note above.   Doing well, treat as you are doing Minimize sedation Treat pneumonia 7 days  PCCM will sign off  Roselie Awkward, MD [place] P[facility]M Pager: [phone] Cell: [phone] If no response, call [phone]

## 2014-05-16 NOTE — Progress Notes (Signed)
Subjective: She wants the NG tube out.  Objective: Vital signs in last 24 hours: Temp:  [98.2 F (36.8 C)-99.5 F (37.5 C)] 99.1 F (37.3 C) (11/27 0800) Pulse Rate:  [75-94] 81 (11/27 0800) Resp:  [15-28] 20 (11/27 0800) BP: (127-162)/(41-58) 152/41 mmHg (11/27 0800) SpO2:  [87 %-95 %] 93 % (11/27 0800) Weight:  [61.8 kg (136 lb 3.9 oz)] 61.8 kg (136 lb 3.9 oz) (11/27 0536) Last BM Date: 05/12/14  Intake/Output from previous day: 11/26 0701 - 11/27 0700 In: 730 [I.V.:230; IV Piggyback:500] Out: 1565 [Urine:1015; Emesis/NG output:550] Intake/Output this shift: Total I/O In: 100 [IV Piggyback:100] Out: 60 [Urine:60]  General appearance: alert and argumentative GI: soft, non-tender; bowel sounds normal; no masses,  no organomegaly  Lab Results:  Recent Labs  05/14/14 0645 05/15/14 0219 05/15/14 1755 05/16/14 0350  WBC 13.8* 9.5  --  8.7  HGB 12.1 11.1* 12.0 11.6*  HCT 37.7 34.1* 36.6 35.7*  PLT 290 212  --  296   BMET  Recent Labs  05/14/14 0533 05/15/14 0219 05/16/14 0350  NA 131* 131* 133*  K 5.0 4.8 4.6  CL 90* 90* 91*  CO2 31 31 31   GLUCOSE 128* 94 70  BUN 11 10 11   CREATININE 0.53 0.47* 0.44*  CALCIUM 9.3 9.2 8.7   LFT  Recent Labs  05/16/14 0350  PROT 6.1  ALBUMIN 2.6*  AST 14  ALT <5  ALKPHOS 62  BILITOT 0.3  BILIDIR PENDING  IBILI PENDING   PT/INR No results for input(s): LABPROT, INR in the last 72 hours. Hepatitis Panel No results for input(s): HEPBSAG, HCVAB, HEPAIGM, HEPBIGM in the last 72 hours. C-Diff No results for input(s): CDIFFTOX in the last 72 hours. Fecal Lactopherrin No results for input(s): FECLLACTOFRN in the last 72 hours.  Studies/Results: Dg Abd 1 View  05/14/2014   CLINICAL DATA:  Generalized abdominal pain, nausea and vomiting  EXAM: ABDOMEN - 1 VIEW  COMPARISON:  05/12/2014 and 6/6/ 15  FINDINGS: There is gaseous distended viscus in mid and left abdomen measures at least 9.6 cm in diameter. This is highly  suspicious for segmental gaseous distension of probable transverse colon. Colonic ileus or colonic obstruction cannot be excluded. Some gas noted within stomach.  IMPRESSION: Gaseous distended viscus in mid and left abdomen measuring at least 9.6 cm in diameter highly suspicious for colonic segmental distension. Highly suspicious for segmental ileus or colonic obstruction. Clinical correlation is necessary.   Electronically Signed   By: Lahoma Crocker M.D.   On: 05/14/2014 10:10   Dg Chest Port 1 View  05/15/2014   CLINICAL DATA:  Hypoxemia, smoker, hypertension  EXAM: PORTABLE CHEST - 1 VIEW  COMPARISON:  05/14/2014  FINDINGS: NG tube within the stomach, tip not visualized. Stable cardiomegaly with central vascular congestion. Persistent right base collapse/ consolidation obscuring the hemidiaphragm with likely an associated effusion. Right base pneumonia not excluded. Stable left lung aeration. No pneumothorax. Atherosclerosis of the aorta.  IMPRESSION: Persistent right base collapse/consolidation and likely small effusion. Pneumonia not excluded.   Electronically Signed   By: Daryll Brod M.D.   On: 05/15/2014 10:55   Dg Abd Portable 1v  05/16/2014   CLINICAL DATA:  Ileus  EXAM: PORTABLE ABDOMEN - 1 VIEW  COMPARISON:  05/15/2014  FINDINGS: Nasogastric tube tip in the region of the pylorus.  Decreasing distention of small bowel in the left abdomen, 6-7 cm currently.  Chronic elevation the right diaphragm with superimposed pleural effusion.  The remainder of  the abdomen is stable.  IMPRESSION: 1. Focal dilation of small bowel in the left abdomen is persistent, but gradually decreasing. 2. Nasogastric tube tip at the pylorus.   Electronically Signed   By: Jorje Guild M.D.   On: 05/16/2014 05:44   Dg Abd Portable 1v  05/15/2014   CLINICAL DATA:  Abdominal pain.  Question ileus.  EXAM: PORTABLE ABDOMEN - 1 VIEW  COMPARISON:  CT chest and abdomen 05/12/2014. Single view of the abdomen 11/23/2013 and  05/14/2014.  FINDINGS: Gaseous distention of a loop of bowel in the left upper quadrant is again seen. The loop measures 7.8 cm today compared to 9.6 cm yesterday. Basilar view of comparison CT, this is a loop of small bowel. The examination is otherwise unremarkable.  IMPRESSION: Mild decrease in gaseous distention of a loop of small bowel in the left upper quadrant of the abdomen.   Electronically Signed   By: Inge Rise M.D.   On: 05/15/2014 10:16    Medications:  Scheduled: . acetaminophen  1,000 mg Oral Q8H  . antiseptic oral rinse  7 mL Mouth Rinse BID  . aspirin  81 mg Oral q morning - 10a  . budesonide-formoterol  2 puff Inhalation BID  . calcitonin (salmon)  1 spray Alternating Nares Daily  . carbidopa-levodopa  0.5 tablet Per Tube QID  . ceFEPime (MAXIPIME) IV  1 g Intravenous Q12H  . diltiazem  120 mg Oral Daily  . heparin  5,000 Units Subcutaneous 3 times per day  . nicotine  14 mg Transdermal Daily  . pantoprazole (PROTONIX) IV  40 mg Intravenous Q12H  . senna  1 tablet Oral BID  . sodium chloride  3 mL Intravenous Q12H  . tiotropium  18 mcg Inhalation Daily  . vancomycin  500 mg Intravenous BID   Continuous:   Assessment/Plan: 1) Small bowel focal ileus - improved. 2) COPD. 3) T6 fracture.   Clinically she has improved.  No further abdominal pain and the KUB reveals a decrease in her small bowel dilation by 2 cm.  She is agreeable to keeping the NG tube in her stomach after much back and forth debating.   Plan: 1) Continue with NG tube with low intermittent suctioning.   LOS: 4 days   Eiza Canniff D 05/16/2014, 8:20 AM

## 2014-05-16 NOTE — Progress Notes (Signed)
[facility] PROGRESS NOTE  Jill White:[id number] DOB: Oct 23, 1940 DOA: 05/12/2014 PCP: Laurey Morale, MD  Assessment/Plan: 1-Acute hypercapnic, hypoxic Respiratory Failure;  Secondary to oversedation, PNA.  Continue with BIPAP PRN.   Continue with Vancomycin and cefepime.  Chest x ray with evidence of PNA.  CCM following.  Patient alert this morning. Following command.   2-PNA;  Repeated Chest X-ray 11-26; persistent right base collapse, and small effusion.  Continue with Vancomycin cefepime day 3.  Ileus, vs colonic obstruction.  KUB decreasing focal dilation of small bowel.  dulcolax suppository.  Repeat KUB in am.  Continue with NG tube.   3-COPD: Continue with Spiriva, and Symbicort.   4-Acute on Chronic systolic, diastolic HF exacerbation.  Hold Cozaar, continue with Cardizem.  Hold lasix today due to NPO status.  Repeated Chest X-ray 11-26; persistent right base collapse, and small effusion.   5-Chest Pain, Back pain:  Worsening T 6 Compression fracture:  Patient relates back pain, chest pain. She had Ct angio 11-23 which was negative for PE.  Troponin times 3 negative. Patient was evaluated by cardiology 11-23 who thought chest pain was neuropathic pain.  On fentanyl for pain, but not helping.  Per Dr Ronnald Ramp no need for kyphoplasty. Pain management; steroids, muscle relaxant, tramadol.  Palliative consulted.  Check Abdominal US.   6-DVT prophylaxis; Heparin.   Code Status: Full Code.  Family Communication: Care discussed with granddaughter.  Disposition Plan: Remain ICU.    Consultants:  CCM.   Dr Benson Norway  Procedures:  none  Antibiotics: Cefepime 11-25 Vancomycin 11-25   HPI/Subjective: Still with chest pain, 4/10. She is concern that chest pain could be from gallbladder.  She agree to see palliative care for pain management.  No BM yet.  Patient does not take sinemet.     Objective: Filed Vitals:   05/16/14 0624  BP:   Pulse:  86  Temp: 99 F (37.2 C)  Resp: 24    Intake/Output Summary (Last 24 hours) at 05/16/14 0812 Last data filed at 05/16/14 2952  Gross per 24 hour  Intake    620 ml  Output   1565 ml  Net   -945 ml   Filed Weights   05/12/14 2016 05/14/14 1632 05/16/14 0536  Weight: 59.24 kg (130 lb 9.6 oz) 62 kg (136 lb 11 oz) 61.8 kg (136 lb 3.9 oz)    Exam:   General:  Alert, awake. NG tube in place.   Cardiovascular: S 1, S 2 RRR, systolic  murmur.   Respiratory: Bilateral air movement, bilateral crackles.   Abdomen: BS decrease, NT, mild distended. No rigidity. Soft.  Musculoskeletal: no edema.   Data Reviewed: Basic Metabolic Panel:  Recent Labs Lab 05/12/14 1446 05/13/14 0445 05/14/14 0533 05/15/14 0219 05/16/14 0350  NA 131* 140 131* 131* 133*  K 4.0 3.6* 5.0 4.8 4.6  CL 90* 105 90* 90* 91*  CO2 29 26 31 31 31   GLUCOSE 102* 119* 128* 94 70  BUN 9 15 11 10 11   CREATININE 0.47* 0.67 0.53 0.47* 0.44*  CALCIUM 9.6 6.9* 9.3 9.2 8.7  MG  --  1.4*  --  1.7 2.0  PHOS  --   --   --  3.3 2.6   Liver Function Tests:  Recent Labs Lab 05/12/14 1344 05/12/14 1446 05/13/14 0445 05/14/14 0533  AST HEMOLYZED SPECIMEN, RESULTS MAY BE AFFECTED 15 12 15   ALT HEMOLYZED SPECIMEN, RESULTS MAY BE AFFECTED 12 9 10   ALKPHOS HEMOLYZED SPECIMEN, RESULTS  MAY BE AFFECTED 86 63 77  BILITOT HEMOLYZED SPECIMEN, RESULTS MAY BE AFFECTED 0.7 0.3 0.3  PROT HEMOLYZED SPECIMEN, RESULTS MAY BE AFFECTED 7.2 5.2* 7.0  ALBUMIN HEMOLYZED SPECIMEN, RESULTS MAY BE AFFECTED 3.6 2.5* 3.2*    Recent Labs Lab 05/12/14 1344 05/12/14 1446  LIPASE 20 14   No results for input(s): AMMONIA in the last 168 hours. CBC:  Recent Labs Lab 05/12/14 1344  05/13/14 0445 05/14/14 0645 05/15/14 0219 05/15/14 1755 05/16/14 0350  WBC 12.8*  --  6.6 13.8* 9.5  --  8.7  NEUTROABS 8.5*  --   --  11.6* 7.3  --  6.5  HGB 15.2*  < > 11.5* 12.1 11.1* 12.0 11.6*  HCT 43.9  < > 34.8* 37.7 34.1* 36.6 35.7*  MCV  95.0  --  99.1 100.3* 99.4  --  99.4  PLT 364  --  270 290 212  --  296  < > = values in this interval not displayed. Cardiac Enzymes:  Recent Labs Lab 05/12/14 1623 05/14/14 1422 05/14/14 2041 05/15/14 0219  TROPONINI <0.30 <0.30 <0.30 <0.30   BNP (last 3 results)  Recent Labs  03/26/14 1005 05/12/14 1316 05/14/14 0533  PROBNP 94.0 577.4* 1351.0*   CBG: No results for input(s): GLUCAP in the last 168 hours.  Recent Results (from the past 240 hour(s))  MRSA PCR Screening     Status: None   Collection Time: 05/14/14  4:54 AM  Result Value Ref Range Status   MRSA by PCR NEGATIVE NEGATIVE Final    Comment:        The GeneXpert MRSA Assay (FDA approved for NASAL specimens only), is one component of a comprehensive MRSA colonization surveillance program. It is not intended to diagnose MRSA infection nor to guide or monitor treatment for MRSA infections.   Culture, blood (routine x 2)     Status: None (Preliminary result)   Collection Time: 05/14/14  6:45 AM  Result Value Ref Range Status   Specimen Description BLOOD RIGHT ANTECUBITAL  Final   Special Requests BOTTLES DRAWN AEROBIC AND ANAEROBIC 5CC  Final   Culture  Setup Time   Final    05/14/2014 09:02 Performed at [facility]    Culture   Final           BLOOD CULTURE RECEIVED NO GROWTH TO DATE CULTURE WILL BE HELD FOR 5 DAYS BEFORE ISSUING A FINAL NEGATIVE REPORT Performed at [facility]    Report Status PENDING  Incomplete  Culture, blood (routine x 2)     Status: None (Preliminary result)   Collection Time: 05/14/14  6:50 AM  Result Value Ref Range Status   Specimen Description BLOOD RIGHT HAND  Final   Special Requests BOTTLES DRAWN AEROBIC AND ANAEROBIC 5CC  Final   Culture  Setup Time   Final    05/14/2014 09:02 Performed at [facility]    Culture   Final           BLOOD CULTURE RECEIVED NO GROWTH TO DATE CULTURE WILL BE HELD FOR 5 DAYS BEFORE ISSUING A FINAL NEGATIVE  REPORT Performed at [facility]    Report Status PENDING  Incomplete     Studies: Dg Abd 1 View  05/14/2014   CLINICAL DATA:  Generalized abdominal pain, nausea and vomiting  EXAM: ABDOMEN - 1 VIEW  COMPARISON:  05/12/2014 and 6/6/ 15  FINDINGS: There is gaseous distended viscus in mid and left abdomen measures at least  9.6 cm in diameter. This is highly suspicious for segmental gaseous distension of probable transverse colon. Colonic ileus or colonic obstruction cannot be excluded. Some gas noted within stomach.  IMPRESSION: Gaseous distended viscus in mid and left abdomen measuring at least 9.6 cm in diameter highly suspicious for colonic segmental distension. Highly suspicious for segmental ileus or colonic obstruction. Clinical correlation is necessary.   Electronically Signed   By: Lahoma Crocker M.D.   On: 05/14/2014 10:10   Dg Chest Port 1 View  05/15/2014   CLINICAL DATA:  Hypoxemia, smoker, hypertension  EXAM: PORTABLE CHEST - 1 VIEW  COMPARISON:  05/14/2014  FINDINGS: NG tube within the stomach, tip not visualized. Stable cardiomegaly with central vascular congestion. Persistent right base collapse/ consolidation obscuring the hemidiaphragm with likely an associated effusion. Right base pneumonia not excluded. Stable left lung aeration. No pneumothorax. Atherosclerosis of the aorta.  IMPRESSION: Persistent right base collapse/consolidation and likely small effusion. Pneumonia not excluded.   Electronically Signed   By: Daryll Brod M.D.   On: 05/15/2014 10:55   Dg Abd Portable 1v  05/16/2014   CLINICAL DATA:  Ileus  EXAM: PORTABLE ABDOMEN - 1 VIEW  COMPARISON:  05/15/2014  FINDINGS: Nasogastric tube tip in the region of the pylorus.  Decreasing distention of small bowel in the left abdomen, 6-7 cm currently.  Chronic elevation the right diaphragm with superimposed pleural effusion.  The remainder of the abdomen is stable.  IMPRESSION: 1. Focal dilation of small bowel in the left  abdomen is persistent, but gradually decreasing. 2. Nasogastric tube tip at the pylorus.   Electronically Signed   By: Jorje Guild M.D.   On: 05/16/2014 05:44   Dg Abd Portable 1v  05/15/2014   CLINICAL DATA:  Abdominal pain.  Question ileus.  EXAM: PORTABLE ABDOMEN - 1 VIEW  COMPARISON:  CT chest and abdomen 05/12/2014. Single view of the abdomen 11/23/2013 and 05/14/2014.  FINDINGS: Gaseous distention of a loop of bowel in the left upper quadrant is again seen. The loop measures 7.8 cm today compared to 9.6 cm yesterday. Basilar view of comparison CT, this is a loop of small bowel. The examination is otherwise unremarkable.  IMPRESSION: Mild decrease in gaseous distention of a loop of small bowel in the left upper quadrant of the abdomen.   Electronically Signed   By: Inge Rise M.D.   On: 05/15/2014 10:16    Scheduled Meds: . acetaminophen  1,000 mg Oral Q8H  . antiseptic oral rinse  7 mL Mouth Rinse BID  . aspirin  81 mg Oral q morning - 10a  . budesonide-formoterol  2 puff Inhalation BID  . calcitonin (salmon)  1 spray Alternating Nares Daily  . carbidopa-levodopa  0.5 tablet Per Tube QID  . ceFEPime (MAXIPIME) IV  1 g Intravenous Q12H  . diltiazem  120 mg Oral Daily  . heparin  5,000 Units Subcutaneous 3 times per day  . nicotine  14 mg Transdermal Daily  . pantoprazole (PROTONIX) IV  40 mg Intravenous Q12H  . senna  1 tablet Oral BID  . sodium chloride  3 mL Intravenous Q12H  . tiotropium  18 mcg Inhalation Daily  . vancomycin  500 mg Intravenous BID   Continuous Infusions:    Principal Problem:   Non-traumatic compression fracture of T6 thoracic vertebra Active Problems:   Essential hypertension   Hyponatremia   Tobacco abuse   COPD (chronic obstructive pulmonary disease)   Acute respiratory failure   Mild aortic  stenosis   Acute on chronic combined systolic and diastolic congestive heart failure, NYHA class 4   Back pain   Chest pain   Compression deformity  of vertebra   LBBB (left bundle branch block)   Thoracic spine fracture   Combined systolic and diastolic heart failure   Compression fracture of thoracic spine, non-traumatic   Acute on chronic respiratory failure   PNA (pneumonia)   Ileus    Time spent: 30 minutes.     Niel Hummer A  [facility] Pager [phone]. If 7PM-7AM, please contact night-coverage at [email or web address], password [facility] 05/16/2014, 8:12 AM  LOS: 4 days

## 2014-05-16 NOTE — Consult Note (Signed)
Patient Jill White      DOB: 16-Jun-1941      KCL:[id number]     Consult Note from the Palliative Medicine Team at [facility] Requested by: Dr Tyrell Antonio     PCP: Laurey Morale, MD Reason for Consultation: Pain Management    Phone Number:[phone]  Assessment/Recommendations: 73 yo female with COPD, HTN, tobacco abuse, admitted with back/chest pain in setting of T6 compression fx.  Stay complicated by hypercarbic resp failure, ileus, and ongoing back/chest pain  1.  Code Status: Full  2. Symptom Management:   1. Back Pain: in setting of T6 compression fracture.  Likely mixed pain with neuropathic component.  Pain management complicated by hypercarbic resp failure after aggressive oral and IV morphine regimen. Patient took tramadol at home prior but otherwise relatively opioid naive and reports sensitivity to pain medications.  I agree with scheduled tylenol and PRN IV fentanyl.  With ileus, can wait to transition to oral meds.  I would avoid NSAID's give history of GIB and PUD.  Will try lidoderm patch. Can also consider low dose gabapentin (can also be sedating) and/or cymbalta.  She is nervous about any medications with side effects so would use slow, step-wise approach.  She is already nervous about oral opioids so will try to minimize and use adjuvant meds as able.   2. Hypercarbic resp failure- I agree that this most likely was 2/2 opioids.  Agree with holding benzo's. Would try to avoid restarting if able.   3. Psychosocial/Spiritual: Lives at home in [place] HPI: 73 yo female with PMHx of COPD, tobacco abuse, T6 compression fx who presented on 11/23 with back/chest pain of 3-4 days duration.  She described as a crushing, intermittent pain without exacerbating factors. Has component of radiation around sides with numbness.  Admitted for pain control and after titration of IV morphine to 4mg  PRN and MS contin 30mg  BID, she developed hypercarbic resp  failure.  Also reprorted that pain continued to be poorly controlled. NUS evaluated and do not think kyphoplasty or vertebroplasty would be beneficial. Her stay has also been complicated by ileus for which GI has been following.   Today she has been receiving Fentanyl 12.33mcg IV almost every 2h.  Pain is up to 10/10 and comes down to about 6/10 for a while with IV fentanyl.  She is very worried about any additional medicines or med changes. No other exacerbating/relieving factors.  Has NG tube in which is bothersome.  No BM.  No other acute complaints.  Has some nausea for which she is taking zofran with relief.       PMH:  Past Medical History  Diagnosis Date  . HIP PAIN   . HYPERTENSION   . Restless leg syndrome   . COPD (chronic obstructive pulmonary disease)     a. Multiple prior admissions for acute hypoxic respiratory failure in setting of COPD exacerbations +/- pneumonia.  . Peptic ulcer disease     a. 2013: gastric antrum.  Marland Kitchen Hyponatremia   . Tobacco abuse   . Hypomagnesemia   . LBBB (left bundle branch block)     a. Intermittent, likely rate related.  . Pulmonary HTN     a. Elevated PA pressure by prior echoes.  . Mitral regurgitation     a. Mild MR by echo 2014, not mentioned on 11/2013 (severely calcified annulus).  Marland Kitchen Respiratory failure     a. Adm 11/2013 with severe hypercarbic and  hypoxemic respiratory failure requiring intubation.   . Abnormal echocardiogram     a. 11/2013: decreased EF 45-50%, moderate AS- > subsequent cath 12/02/13 with mildly calcified cors without obstruction, mild AS, EF 60%, mild pulm HTN.  Marland Kitchen Aortic stenosis     a. 11/2013: mod by echo, then mild by cath.  . Pulmonary HTN     a. 11/2013: mild by cath.  . Wide-complex tachycardia     a. Felt likely atrial tach with rate-related LBBB.  Marland Kitchen Chronic diastolic CHF (congestive heart failure)      PSH: Past Surgical History  Procedure Laterality Date  . Abdominal hysterectomy    . Dilation and  curettage of uterus    . Tonsillectomy    . Esophagogastroduodenoscopy  07/09/2011    Procedure: ESOPHAGOGASTRODUODENOSCOPY (EGD);  Surgeon: Missy Sabins, MD;  Location: [facility];  Service: Endoscopy;  Laterality: N/A;  patient in room 1532  . Cataract extraction w/ intraocular lens  implant, bilateral Bilateral 03/2013    Patient claims it was within the month.    I have reviewed the [facility] and SH and  If appropriate update it with new information. Allergies  Allergen Reactions  . Tetanus Toxoid Anaphylaxis  . Codeine Nausea And Vomiting and Other (See Comments)    Itching and faint   . Erythromycin Nausea And Vomiting  . Meloxicam Itching  . Methylprednisolone Hives  . Penicillins Hives  . Prednisone Other (See Comments)    GI bleed   Scheduled Meds: . acetaminophen  1,000 mg Oral Q8H  . antiseptic oral rinse  7 mL Mouth Rinse BID  . aspirin  81 mg Oral q morning - 10a  . budesonide-formoterol  2 puff Inhalation BID  . calcitonin (salmon)  1 spray Alternating Nares Daily  . ceFEPime (MAXIPIME) IV  1 g Intravenous Q12H  . diltiazem  30 mg Oral 4 times per day  . heparin  5,000 Units Subcutaneous 3 times per day  . lidocaine  1 patch Transdermal Q24H  . nicotine  14 mg Transdermal Daily  . pantoprazole (PROTONIX) IV  40 mg Intravenous Q12H  . senna  1 tablet Oral BID  . sodium chloride  3 mL Intravenous Q12H  . tiotropium  18 mcg Inhalation Daily  . vancomycin  500 mg Intravenous BID   Continuous Infusions:  PRN Meds:.sodium chloride, albuterol, bisacodyl, fentaNYL, ondansetron (ZOFRAN) IV, ondansetron, polyethylene glycol, sodium chloride    BP 171/48 mmHg  Pulse 80  Temp(Src) 99.3 F (37.4 C) (Core (Comment))  Resp 17  Ht 5\' 2"  (1.575 m)  Wt 61.8 kg (136 lb 3.9 oz)  BMI 24.91 kg/m2  SpO2 89%   PPS: 70   Intake/Output Summary (Last 24 hours) at 05/16/14 1429 Last data filed at 05/16/14 1247  Gross per 24 hour  Intake    610 ml  Output   1410 ml  Net   -800  ml    Physical Exam:  General: Alert, NAD HEENT:  [place], sclera anicteric Chest:   CTAB CVS: RRR Abdomen: soft, ND Ext:  No edema EGB:TDVVOHY and paraspinal tenderness in mid-thoracic spine. Pain radiates with midline palpation.    Labs: CBC    Component Value Date/Time   WBC 8.7 05/16/2014 0350   RBC 3.59* 05/16/2014 0350   HGB 11.6* 05/16/2014 0350   HCT 35.7* 05/16/2014 0350   PLT 296 05/16/2014 0350   MCV 99.4 05/16/2014 0350   MCH 32.3 05/16/2014 0350   MCHC 32.5 05/16/2014 0350  RDW 12.2 05/16/2014 0350   LYMPHSABS 1.5 05/16/2014 0350   MONOABS 0.6 05/16/2014 0350   EOSABS 0.1 05/16/2014 0350   BASOSABS 0.0 05/16/2014 0350    BMET    Component Value Date/Time   NA 133* 05/16/2014 0350   K 4.6 05/16/2014 0350   CL 91* 05/16/2014 0350   CO2 31 05/16/2014 0350   GLUCOSE 70 05/16/2014 0350   BUN 11 05/16/2014 0350   CREATININE 0.44* 05/16/2014 0350   CALCIUM 8.7 05/16/2014 0350   GFRNONAA >90 05/16/2014 0350   GFRAA >90 05/16/2014 0350    CMP     Component Value Date/Time   NA 133* 05/16/2014 0350   K 4.6 05/16/2014 0350   CL 91* 05/16/2014 0350   CO2 31 05/16/2014 0350   GLUCOSE 70 05/16/2014 0350   BUN 11 05/16/2014 0350   CREATININE 0.44* 05/16/2014 0350   CALCIUM 8.7 05/16/2014 0350   PROT 6.1 05/16/2014 0350   ALBUMIN 2.6* 05/16/2014 0350   AST 14 05/16/2014 0350   ALT <5 05/16/2014 0350   ALKPHOS 62 05/16/2014 0350   BILITOT 0.3 05/16/2014 0350   GFRNONAA >90 05/16/2014 0350   GFRAA >90 05/16/2014 0350   11/26 CXR IMPRESSION: Persistent right base collapse/consolidation and likely small effusion. Pneumonia not excluded.  11/27 KUB IMPRESSION: 1. Focal dilation of small bowel in the left abdomen is persistent, but gradually decreasing. 2. Nasogastric tube tip at the pylorus.  11/27 RUQ US IMPRESSION: 1. Negative gallbladder. 2. Right pleural effusion.   Total Time: 60 minutes Greater than 50%  of this time was spent  counseling and coordinating care related to the above assessment and plan.   Doran Clay D.O. Palliative Medicine Team at [facility]  Pager: [phone] Team Phone: [phone]

## 2014-05-16 NOTE — Progress Notes (Signed)
I saw this patient yesterday at the request of the hospitalist. She was originally to be seen by Dr. Vertell Limber regarding a 30% T5 compression fracture of unknown acuity. It appears she had a compression fracture of T5 noted in June but it is slightly worse on her latest CT scan of the chest. She presented with significant interscapular pain. She developed ileus and respiratory depression with narcotic analgesics. I spoke with the patient and her daughter at length. The patient drifted in and out of sleep during the conversation. I reiterated the principal points with the patient when she awoke. At this point I would not recommend vertebroplasty or kyphoplasty. It appears she is early in the course and the fracture is not overly impressive. I think it increases the chance of adjacent level fracture and studies have suggested it is not necessarily any better than placebo. Bracing of a T5 fracture is quite difficult. I talked about potential braces with the patient and her daughter but I'm not sure that bracing offers much in the way of obvious support to this fracture. I recommend simple pain control and activity modifications. You can use NSAIDs or muscle relaxants or even tramadol potentially. She states she has taken tramadol in the past without problems. She is going to have to tolerate some level of pain. Dr. Vertell Limber will see her on Monday if she is still in the hospital. If not she can follow-up with him as an outpatient.

## 2014-05-17 ENCOUNTER — Inpatient Hospital Stay (HOSPITAL_COMMUNITY): Payer: Medicare Other

## 2014-05-17 DIAGNOSIS — J9602 Acute respiratory failure with hypercapnia: Secondary | ICD-10-CM

## 2014-05-17 DIAGNOSIS — G2581 Restless legs syndrome: Secondary | ICD-10-CM

## 2014-05-17 LAB — CBC WITH DIFFERENTIAL/PLATELET
Basophils Absolute: 0 10*3/uL (ref 0.0–0.1)
Basophils Relative: 0 % (ref 0–1)
EOS ABS: 0.1 10*3/uL (ref 0.0–0.7)
EOS PCT: 1 % (ref 0–5)
HEMATOCRIT: 36.4 % (ref 36.0–46.0)
Hemoglobin: 12 g/dL (ref 12.0–15.0)
LYMPHS ABS: 1.5 10*3/uL (ref 0.7–4.0)
LYMPHS PCT: 16 % (ref 12–46)
MCH: 32 pg (ref 26.0–34.0)
MCHC: 33 g/dL (ref 30.0–36.0)
MCV: 97.1 fL (ref 78.0–100.0)
MONO ABS: 0.8 10*3/uL (ref 0.1–1.0)
MONOS PCT: 9 % (ref 3–12)
Neutro Abs: 6.8 10*3/uL (ref 1.7–7.7)
Neutrophils Relative %: 74 % (ref 43–77)
Platelets: 352 10*3/uL (ref 150–400)
RBC: 3.75 MIL/uL — AB (ref 3.87–5.11)
RDW: 12.2 % (ref 11.5–15.5)
WBC: 9.2 10*3/uL (ref 4.0–10.5)

## 2014-05-17 LAB — TROPONIN I: Troponin I: <0.3 ng/mL (ref <0.30)

## 2014-05-17 LAB — GLUCOSE, CAPILLARY: Glucose-Capillary: 85 mg/dL (ref 70–99)

## 2014-05-17 LAB — BASIC METABOLIC PANEL
Anion gap: 16 — ABNORMAL HIGH (ref 5–15)
BUN: 12 mg/dL (ref 6–23)
CHLORIDE: 90 meq/L — AB (ref 96–112)
CO2: 26 meq/L (ref 19–32)
CREATININE: 0.41 mg/dL — AB (ref 0.50–1.10)
Calcium: 9.2 mg/dL (ref 8.4–10.5)
GFR calc Af Amer: >90 mL/min (ref >90)
GFR calc non Af Amer: >90 mL/min (ref >90)
GLUCOSE: 69 mg/dL — AB (ref 70–99)
POTASSIUM: 4.1 meq/L (ref 3.7–5.3)
Sodium: 132 mEq/L — ABNORMAL LOW (ref 137–147)

## 2014-05-17 LAB — VANCOMYCIN, TROUGH: Vancomycin Tr: <5 ug/mL — ABNORMAL LOW (ref 10.0–20.0)

## 2014-05-17 LAB — PHOSPHORUS: Phosphorus: 2.5 mg/dL (ref 2.3–4.6)

## 2014-05-17 LAB — MAGNESIUM: Magnesium: 1.6 mg/dL (ref 1.5–2.5)

## 2014-05-17 MED ORDER — FENTANYL CITRATE 0.05 MG/ML IJ SOLN: 12.5000 ug | INTRAMUSCULAR | Status: DC | PRN

## 2014-05-17 MED ORDER — VANCOMYCIN HCL IN DEXTROSE 1-5 GM/200ML-% IV SOLN
1000.0000 mg | Freq: Two times a day (BID) | INTRAVENOUS | Status: DC
  Administered 2014-05-18: 1000 mg via INTRAVENOUS
  Filled 2014-05-17: qty 200

## 2014-05-17 MED ORDER — GABAPENTIN 100 MG PO CAPS
100.0000 mg | ORAL_CAPSULE | Freq: Three times a day (TID) | ORAL | Status: DC
  Administered 2014-05-17 – 2014-05-19 (×7): 100 mg via ORAL
  Filled 2014-05-17 (×9): qty 1

## 2014-05-17 MED ORDER — HYDROMORPHONE HCL 2 MG PO TABS
2.0000 mg | ORAL_TABLET | ORAL | Status: DC | PRN
  Administered 2014-05-17 – 2014-05-19 (×10): 2 mg via ORAL
  Filled 2014-05-17 (×11): qty 1

## 2014-05-17 MED ORDER — ACETAMINOPHEN 10 MG/ML IV SOLN: 1000.0000 mg | Freq: Four times a day (QID) | INTRAVENOUS | Status: DC | PRN

## 2014-05-17 MED ORDER — ACETAMINOPHEN 10 MG/ML IV SOLN
1000.0000 mg | Freq: Three times a day (TID) | INTRAVENOUS | Status: AC | PRN
Start: 2014-05-17 — End: 2014-05-18
  Administered 2014-05-17 (×2): 1000 mg via INTRAVENOUS
  Filled 2014-05-17 (×3): qty 100

## 2014-05-17 MED ORDER — ACETAMINOPHEN 650 MG RE SUPP: 650.0000 mg | Freq: Four times a day (QID) | RECTAL | Status: DC | PRN

## 2014-05-17 NOTE — Progress Notes (Signed)
[place] for Cefepime/Vancomycin Indication: HCAP  Allergies  Allergen Reactions  . Tetanus Toxoid Anaphylaxis  . Codeine Nausea And Vomiting and Other (See Comments)    Itching and faint   . Erythromycin Nausea And Vomiting  . Meloxicam Itching  . Methylprednisolone Hives  . Penicillins Hives  . Prednisone Other (See Comments)    GI bleed    Patient Measurements: Height: 5\' 2"  (157.5 cm) Weight: 135 lb 2.3 oz (61.3 kg) IBW/kg (Calculated) : 50.1   Vital Signs: Temp: 98.8 F (37.1 C) (11/28 1200) Temp Source: Oral (11/28 1200) BP: 164/59 mmHg (11/28 1537) Pulse Rate: 74 (11/28 1537) Intake/Output from previous day: 11/27 0701 - 11/28 0700 In: 610 [I.V.:210; IV Piggyback:400] Out: 1385 [Urine:785; Emesis/NG output:600] Intake/Output from this shift:    Labs:  Recent Labs  05/15/14 0219 05/15/14 1755 05/16/14 0350 05/17/14 0344  WBC 9.5  --  8.7 9.2  HGB 11.1* 12.0 11.6* 12.0  PLT 212  --  296 352  CREATININE 0.47*  --  0.44* 0.41*   Estimated Creatinine Clearance: 52.4 mL/min (by C-G formula based on Cr of 0.41).  Recent Labs  05/17/14 1921  [place] <5.0*     Microbiology: Recent Results (from the past 720 hour(s))  MRSA PCR Screening     Status: None   Collection Time: 05/14/14  4:54 AM  Result Value Ref Range Status   MRSA by PCR NEGATIVE NEGATIVE Final    Comment:        The GeneXpert MRSA Assay (FDA approved for NASAL specimens only), is one component of a comprehensive MRSA colonization surveillance program. It is not intended to diagnose MRSA infection nor to guide or monitor treatment for MRSA infections.   Culture, blood (routine x 2)     Status: None (Preliminary result)   Collection Time: 05/14/14  6:45 AM  Result Value Ref Range Status   Specimen Description BLOOD RIGHT ANTECUBITAL  Final   Special Requests BOTTLES DRAWN AEROBIC AND ANAEROBIC 5CC  Final   Culture  Setup Time   Final   05/14/2014 09:02 Performed at [facility]    Culture   Final           BLOOD CULTURE RECEIVED NO GROWTH TO DATE CULTURE WILL BE HELD FOR 5 DAYS BEFORE ISSUING A FINAL NEGATIVE REPORT Performed at [facility]    Report Status PENDING  Incomplete  Culture, blood (routine x 2)     Status: None (Preliminary result)   Collection Time: 05/14/14  6:50 AM  Result Value Ref Range Status   Specimen Description BLOOD RIGHT HAND  Final   Special Requests BOTTLES DRAWN AEROBIC AND ANAEROBIC 5CC  Final   Culture  Setup Time   Final    05/14/2014 09:02 Performed at [facility]    Culture   Final           BLOOD CULTURE RECEIVED NO GROWTH TO DATE CULTURE WILL BE HELD FOR 5 DAYS BEFORE ISSUING A FINAL NEGATIVE REPORT Performed at [facility]    Report Status PENDING  Incomplete    Medical History: Past Medical History  Diagnosis Date  . HIP PAIN   . HYPERTENSION   . Restless leg syndrome   . COPD (chronic obstructive pulmonary disease)     a. Multiple prior admissions for acute hypoxic respiratory failure in setting of COPD exacerbations +/- pneumonia.  . Peptic ulcer disease     a. 2013: gastric  antrum.  Marland Kitchen Hyponatremia   . Tobacco abuse   . Hypomagnesemia   . LBBB (left bundle branch block)     a. Intermittent, likely rate related.  . Pulmonary HTN     a. Elevated PA pressure by prior echoes.  . Mitral regurgitation     a. Mild MR by echo 2014, not mentioned on 11/2013 (severely calcified annulus).  Marland Kitchen Respiratory failure     a. Adm 11/2013 with severe hypercarbic and hypoxemic respiratory failure requiring intubation.   . Abnormal echocardiogram     a. 11/2013: decreased EF 45-50%, moderate AS- > subsequent cath 12/02/13 with mildly calcified cors without obstruction, mild AS, EF 60%, mild pulm HTN.  Marland Kitchen Aortic stenosis     a. 11/2013: mod by echo, then mild by cath.  . Pulmonary HTN     a. 11/2013: mild by cath.  . Wide-complex tachycardia     a. Felt  likely atrial tach with rate-related LBBB.  Marland Kitchen Chronic diastolic CHF (congestive heart failure)     Medications:  Scheduled:  . antiseptic oral rinse  7 mL Mouth Rinse BID  . aspirin  81 mg Oral q morning - 10a  . budesonide-formoterol  2 puff Inhalation BID  . calcitonin (salmon)  1 spray Alternating Nares Daily  . ceFEPime (MAXIPIME) IV  1 g Intravenous Q12H  . diltiazem  30 mg Oral 4 times per day  . gabapentin  100 mg Oral TID  . heparin  5,000 Units Subcutaneous 3 times per day  . nicotine  14 mg Transdermal Daily  . pantoprazole (PROTONIX) IV  40 mg Intravenous Q12H  . senna  1 tablet Oral BID  . sodium chloride  3 mL Intravenous Q12H  . tiotropium  18 mcg Inhalation Daily  . [START ON 05/18/2014] vancomycin  1,000 mg Intravenous Q12H   Assessment: 78 yoF active smoker on PRN O2 at home with frequent COPD exacerbations admitted with chest and abck pain now hypoxic transferred to ICU. Cefepime/Vancomycin per Rx for HCAP.   11/25 >>cefepime >> 11/25 >>vancomycin >>  11/28 VT=<5, on 500mg  q12h   Tmax: AF WBCs: improved to WNL Renal: SCr 0.44, CrCl ~ 59 (CG, SCr 0.8)  11/25 blood: ngtd MRSA PCR: negative  Goal of Therapy:  Vancomycin trough level 15-20 mcg/ml  Appropriate antibiotic dosing for renal function; eradication of infection  Plan:   Continue Cefepime 1Gm IV q12h  Increase Vancomycin to 1Gm IV q12h  F/U SCr/levels/cultures as needed  Dorrene German 05/17/2014 8:56 PM

## 2014-05-17 NOTE — Progress Notes (Signed)
Patient Jill White L Horiuchi      DOB: 08-Dec-1940      MOL:[id number]   Palliative Medicine Team at [facility] Progress Note    Subjective: Tolerated 22mcg fentanyl without any drowsiness. Increased pain control, but only lasting about 45 minutes.  Episode of chest pain overnight noted.  Moved Bowels, NG tube removed. No relief with lidoderm patch.  RLS worse off klonopin    Filed Vitals:   05/17/14 0800  BP: 169/62  Pulse: 96  Temp: 99.1 F (37.3 C)  Resp: 32   Physical exam: General: Alert, NAD HEENT: [place], sclera anicteric Chest: CTAB CVS: RRR, +SEM Abdomen: soft, ND Ext: No edema  CBC    Component Value Date/Time   WBC 9.2 05/17/2014 0344   RBC 3.75* 05/17/2014 0344   HGB 12.0 05/17/2014 0344   HCT 36.4 05/17/2014 0344   PLT 352 05/17/2014 0344   MCV 97.1 05/17/2014 0344   MCH 32.0 05/17/2014 0344   MCHC 33.0 05/17/2014 0344   RDW 12.2 05/17/2014 0344   LYMPHSABS 1.5 05/17/2014 0344   MONOABS 0.8 05/17/2014 0344   EOSABS 0.1 05/17/2014 0344   BASOSABS 0.0 05/17/2014 0344    CMP     Component Value Date/Time   NA 132* 05/17/2014 0344   K 4.1 05/17/2014 0344   CL 90* 05/17/2014 0344   CO2 26 05/17/2014 0344   GLUCOSE 69* 05/17/2014 0344   BUN 12 05/17/2014 0344   CREATININE 0.41* 05/17/2014 0344   CALCIUM 9.2 05/17/2014 0344   PROT 6.1 05/16/2014 0350   ALBUMIN 2.6* 05/16/2014 0350   AST 14 05/16/2014 0350   ALT <5 05/16/2014 0350   ALKPHOS 62 05/16/2014 0350   BILITOT 0.3 05/16/2014 0350   GFRNONAA >90 05/17/2014 0344   GFRAA >90 05/17/2014 0344     Assessment and plan: 73 yo female with COPD, HTN, tobacco abuse, admitted with back/chest pain in setting of T6 compression fx. Stay complicated by hypercarbic resp failure, ileus, and ongoing back/chest pain  1. Code Status: Full  2. Symptom Management:  1. Back Pain: no relief with lidoderm patch.  NG out and moving bowels so will transition off IV meds.  She reports that she  does not tolerate hydrocodone/oxycodone in past. Tramadol ineffective.  Will do low dose PO hydromorphone 2mg  PRN (fairly equivalent to 34mcg dose of fentanyl.  I will decrease IV fentanyl to 12.97mcg to avoid oversedation.  Add gabapentin 100mg  TID for neuropathic component as well as RLS.  Agree with bowel regimen 2. Hypercarbic resp failure- I agree that this most likely was 2/2 opioids.  3. RLS- start neurontin as above.  She has been on klonopin for >38yrs. I do not think this is great medicine for her. RLS was worse last night. Will see if neurontin can help reduce need for benzo.  I am not opposed to restarting at low dose if she needs.   3. Psychosocial/Spiritual: Lives at home in [place] D.O. Palliative Medicine Team at [facility]  Pager: [phone] Team Phone: [phone]

## 2014-05-17 NOTE — Plan of Care (Signed)
Problem: ICU Phase Progression Outcomes Goal: O2 sats trending toward baseline Outcome: Completed/Met Date Met:  05/17/14 Goal: Dyspnea controlled at rest Outcome: Completed/Met Date Met:  05/17/14 Goal: Hemodynamically stable Outcome: Completed/Met Date Met:  05/17/14 Goal: Flu/PneumoVaccines if indicated Outcome: Completed/Met Date Met:  05/17/14 Goal: Initial discharge plan identified Outcome: Completed/Met Date Met:  05/17/14 Goal: Voiding-avoid urinary catheter unless indicated Outcome: Completed/Met Date Met:  05/17/14 Goal: Other ICU Phase Outcomes/Goals Outcome: Completed/Met Date Met:  05/17/14

## 2014-05-17 NOTE — Progress Notes (Signed)
[facility] PROGRESS NOTE  Jill White JJK:[id number] DOB: 08/16/1940 DOA: 05/12/2014 PCP: Laurey Morale, MD  Assessment/Plan: 1-Acute hypercapnic, hypoxic Respiratory Failure;  Secondary to oversedation, PNA.  Continue with BIPAP PRN.   Continue with Vancomycin and cefepime.  Chest x ray with evidence of PNA.   2-PNA;  Repeated Chest X-ray 11-26; persistent right base collapse, and small effusion.  Continue with Vancomycin cefepime day 4/7.  3-Ileus, cecum dilation:  KUB decreasing focal dilation of small bowel.  Had a bowel movement.  Remove NG tube.  KUB with improvement dilation of bowel.  Start Clear diet.   4-COPD: Continue with Spiriva, and Symbicort.   5-Acute on Chronic systolic, diastolic HF exacerbation.  Hold Cozaar, continue with Cardizem.  Hold lasix today due to NPO status.  Repeated Chest X-ray 11-26; persistent right base collapse, and small effusion.   6-Chest Pain, Back pain:  Worsening T 6 Compression fracture:  Patient relates back pain, chest pain. She had Ct angio 11-23 which was negative for PE.  Troponin times 3 negative. Patient was evaluated by cardiology 11-23 who thought chest pain was neuropathic pain.  On fentanyl IV PRN for pain, but not helping.  Per Dr Ronnald Ramp no need for kyphoplasty. Pain management, muscle relaxant, tramadol.  Palliative consulted for pain management.  Started on lidoicaine patch, continue with IV tylenol. Unable to use NSAID due to history PUD.  7-DVT prophylaxis; Heparin.   Code Status: Full Code.  Family Communication: Care discussed with granddaughter.  Disposition Plan: transfer in 24 hour.    Consultants:  CCM.   Dr Benson Norway  Procedures:  none  Antibiotics: Cefepime 11-25 Vancomycin 11-25   HPI/Subjective: Still with chest pain, back pain.  Lidocaine patch not helping. Se wants to discussed with Dr Deitra Mayo further options.  Had a bm yesterday   Objective: Filed Vitals:   05/17/14 0601   BP: 164/45  Pulse:   Temp:   Resp:     Intake/Output Summary (Last 24 hours) at 05/17/14 0813 Last data filed at 05/17/14 0700  Gross per 24 hour  Intake    500 ml  Output   1325 ml  Net   -825 ml   Filed Weights   05/14/14 1632 05/16/14 0536 05/17/14 0500  Weight: 62 kg (136 lb 11 oz) 61.8 kg (136 lb 3.9 oz) 61.3 kg (135 lb 2.3 oz)    Exam:   General:  Alert, awake.  Sitting in the chair.   Cardiovascular: S 1, S 2 RRR, systolic  murmur.   Respiratory: Bilateral air movement, bilateral crackles.   Abdomen: BS decrease, NT, mild distended. No rigidity. Soft.  Musculoskeletal: no edema.   Data Reviewed: Basic Metabolic Panel:  Recent Labs Lab 05/13/14 0445 05/14/14 0533 05/15/14 0219 05/16/14 0350 05/17/14 0344  NA 140 131* 131* 133* 132*  K 3.6* 5.0 4.8 4.6 4.1  CL 105 90* 90* 91* 90*  CO2 26 31 31 31 26   GLUCOSE 119* 128* 94 70 69*  BUN 15 11 10 11 12   CREATININE 0.67 0.53 0.47* 0.44* 0.41*  CALCIUM 6.9* 9.3 9.2 8.7 9.2  MG 1.4*  --  1.7 2.0 1.6  PHOS  --   --  3.3 2.6 2.5   Liver Function Tests:  Recent Labs Lab 05/12/14 1344 05/12/14 1446 05/13/14 0445 05/14/14 0533 05/16/14 0350  AST HEMOLYZED SPECIMEN, RESULTS MAY BE AFFECTED 15 12 15 14   ALT HEMOLYZED SPECIMEN, RESULTS MAY BE AFFECTED 12 9 10  <5  ALKPHOS HEMOLYZED SPECIMEN, RESULTS  MAY BE AFFECTED 86 63 77 62  BILITOT HEMOLYZED SPECIMEN, RESULTS MAY BE AFFECTED 0.7 0.3 0.3 0.3  PROT HEMOLYZED SPECIMEN, RESULTS MAY BE AFFECTED 7.2 5.2* 7.0 6.1  ALBUMIN HEMOLYZED SPECIMEN, RESULTS MAY BE AFFECTED 3.6 2.5* 3.2* 2.6*    Recent Labs Lab 05/12/14 1344 05/12/14 1446  LIPASE 20 14   No results for input(s): AMMONIA in the last 168 hours. CBC:  Recent Labs Lab 05/12/14 1344  05/13/14 0445 05/14/14 0645 05/15/14 0219 05/15/14 1755 05/16/14 0350 05/17/14 0344  WBC 12.8*  --  6.6 13.8* 9.5  --  8.7 9.2  NEUTROABS 8.5*  --   --  11.6* 7.3  --  6.5 6.8  HGB 15.2*  < > 11.5* 12.1 11.1*  12.0 11.6* 12.0  HCT 43.9  < > 34.8* 37.7 34.1* 36.6 35.7* 36.4  MCV 95.0  --  99.1 100.3* 99.4  --  99.4 97.1  PLT 364  --  270 290 212  --  296 352  < > = values in this interval not displayed. Cardiac Enzymes:  Recent Labs Lab 05/12/14 1623 05/14/14 1422 05/14/14 2041 05/15/14 0219 05/17/14 0344  TROPONINI <0.30 <0.30 <0.30 <0.30 <0.30   BNP (last 3 results)  Recent Labs  03/26/14 1005 05/12/14 1316 05/14/14 0533  PROBNP 94.0 577.4* 1351.0*   CBG: No results for input(s): GLUCAP in the last 168 hours.  Recent Results (from the past 240 hour(s))  MRSA PCR Screening     Status: None   Collection Time: 05/14/14  4:54 AM  Result Value Ref Range Status   MRSA by PCR NEGATIVE NEGATIVE Final    Comment:        The GeneXpert MRSA Assay (FDA approved for NASAL specimens only), is one component of a comprehensive MRSA colonization surveillance program. It is not intended to diagnose MRSA infection nor to guide or monitor treatment for MRSA infections.   Culture, blood (routine x 2)     Status: None (Preliminary result)   Collection Time: 05/14/14  6:45 AM  Result Value Ref Range Status   Specimen Description BLOOD RIGHT ANTECUBITAL  Final   Special Requests BOTTLES DRAWN AEROBIC AND ANAEROBIC 5CC  Final   Culture  Setup Time   Final    05/14/2014 09:02 Performed at [facility]    Culture   Final           BLOOD CULTURE RECEIVED NO GROWTH TO DATE CULTURE WILL BE HELD FOR 5 DAYS BEFORE ISSUING A FINAL NEGATIVE REPORT Performed at [facility]    Report Status PENDING  Incomplete  Culture, blood (routine x 2)     Status: None (Preliminary result)   Collection Time: 05/14/14  6:50 AM  Result Value Ref Range Status   Specimen Description BLOOD RIGHT HAND  Final   Special Requests BOTTLES DRAWN AEROBIC AND ANAEROBIC 5CC  Final   Culture  Setup Time   Final    05/14/2014 09:02 Performed at [facility]    Culture   Final           BLOOD  CULTURE RECEIVED NO GROWTH TO DATE CULTURE WILL BE HELD FOR 5 DAYS BEFORE ISSUING A FINAL NEGATIVE REPORT Performed at [facility]    Report Status PENDING  Incomplete     Studies: Dg Chest Port 1 View  05/15/2014   CLINICAL DATA:  Hypoxemia, smoker, hypertension  EXAM: PORTABLE CHEST - 1 VIEW  COMPARISON:  05/14/2014  FINDINGS: NG tube  within the stomach, tip not visualized. Stable cardiomegaly with central vascular congestion. Persistent right base collapse/ consolidation obscuring the hemidiaphragm with likely an associated effusion. Right base pneumonia not excluded. Stable left lung aeration. No pneumothorax. Atherosclerosis of the aorta.  IMPRESSION: Persistent right base collapse/consolidation and likely small effusion. Pneumonia not excluded.   Electronically Signed   By: Daryll Brod M.D.   On: 05/15/2014 10:55   Dg Abd Portable 1v  05/16/2014   CLINICAL DATA:  Ileus  EXAM: PORTABLE ABDOMEN - 1 VIEW  COMPARISON:  05/15/2014  FINDINGS: Nasogastric tube tip in the region of the pylorus.  Decreasing distention of small bowel in the left abdomen, 6-7 cm currently.  Chronic elevation the right diaphragm with superimposed pleural effusion.  The remainder of the abdomen is stable.  IMPRESSION: 1. Focal dilation of small bowel in the left abdomen is persistent, but gradually decreasing. 2. Nasogastric tube tip at the pylorus.   Electronically Signed   By: Jorje Guild M.D.   On: 05/16/2014 05:44   Dg Abd Portable 1v  05/15/2014   CLINICAL DATA:  Abdominal pain.  Question ileus.  EXAM: PORTABLE ABDOMEN - 1 VIEW  COMPARISON:  CT chest and abdomen 05/12/2014. Single view of the abdomen 11/23/2013 and 05/14/2014.  FINDINGS: Gaseous distention of a loop of bowel in the left upper quadrant is again seen. The loop measures 7.8 cm today compared to 9.6 cm yesterday. Basilar view of comparison CT, this is a loop of small bowel. The examination is otherwise unremarkable.  IMPRESSION: Mild  decrease in gaseous distention of a loop of small bowel in the left upper quadrant of the abdomen.   Electronically Signed   By: Inge Rise M.D.   On: 05/15/2014 10:16   US Abdomen Limited Ruq  05/16/2014   CLINICAL DATA:  Chest and abdominal pain, hypertension  EXAM: US ABDOMEN LIMITED - RIGHT UPPER QUADRANT  COMPARISON:  CT 05/12/2014  FINDINGS: Gallbladder:  No gallstones or wall thickening visualized. No sonographic Murphy sign noted.  Common bile duct:  Diameter: 2.6 mm, unremarkable  Liver:  6 x 5 mm cyst in segment 7 as seen previously. No other focal lesion identified. Within normal limits in parenchymal echogenicity.  Right pleural effusion incidentally noted.  IMPRESSION: 1. Negative gallbladder. 2. Right pleural effusion.   Electronically Signed   By: Arne Cleveland M.D.   On: 05/16/2014 09:42    Scheduled Meds: . antiseptic oral rinse  7 mL Mouth Rinse BID  . aspirin  81 mg Oral q morning - 10a  . budesonide-formoterol  2 puff Inhalation BID  . calcitonin (salmon)  1 spray Alternating Nares Daily  . ceFEPime (MAXIPIME) IV  1 g Intravenous Q12H  . diltiazem  30 mg Oral 4 times per day  . heparin  5,000 Units Subcutaneous 3 times per day  . lidocaine  1 patch Transdermal Q24H  . nicotine  14 mg Transdermal Daily  . pantoprazole (PROTONIX) IV  40 mg Intravenous Q12H  . senna  1 tablet Oral BID  . sodium chloride  3 mL Intravenous Q12H  . tiotropium  18 mcg Inhalation Daily  . vancomycin  500 mg Intravenous BID   Continuous Infusions:    Principal Problem:   Non-traumatic compression fracture of T6 thoracic vertebra Active Problems:   Essential hypertension   Hyponatremia   Tobacco abuse   COPD (chronic obstructive pulmonary disease)   Acute respiratory failure   Mild aortic stenosis   Acute on chronic  combined systolic and diastolic congestive heart failure, NYHA class 4   Back pain   Chest pain   Compression deformity of vertebra   LBBB (left bundle branch  block)   Thoracic spine fracture   Combined systolic and diastolic heart failure   Compression fracture of thoracic spine, non-traumatic   Acute on chronic respiratory failure   PNA (pneumonia)   Ileus   Aspiration pneumonia   Palliative care encounter    Time spent: 30 minutes.     Niel Hummer A  [facility] Pager [phone]. If 7PM-7AM, please contact night-coverage at [email or web address], password [facility] 05/17/2014, 8:13 AM  LOS: 5 days

## 2014-05-17 NOTE — Progress Notes (Signed)
[facility] progress note. Chief complaint. Chest pain. History of present illness. This 73 year old female in hospital with acute hypercarbic/hypoxic respiratory failure secondary to pneumonia, ileus with colonic obstruction, chest/back pain secondary to worsening T6 compression fracture, etc. Nursing inform me that the patient complained of central chest pain this a.m. A 12-lead EKG was obtained and this does not look significantly different than prior EKGs. There is no suggestion of acute ischemia. I came to the bedside see the patient and found her in no distress. She states she has been having chest pain associated with her back pain. She states there is ongoing nausea but there was no diaphoresis or radiation of the pain. Vital signs temperature 99.1, pulse 77, respiration 18, blood pressure 166/45. O2 sats 91%. General appearance. Frail elderly female who is alert and in no distress. Cardiac. Rate and rhythm regular. Lungs. Course rhonchi in the right base. Somewhat bronchial and left base. Abdomen. Soft with no significant pain on palpation. Impression/plan. Problem #1. Chest pain. Cranial EKG does not look significantly different or suggestive of acute ischemia. We will obtain troponin now and then every 6 hours for a total of 3 sets. Repeat an EKG in 6 hours to evaluate for any changes.

## 2014-05-17 NOTE — Progress Notes (Signed)
Subjective: She wants to the NG tube removed.  Objective: Vital signs in last 24 hours: Temp:  [99 F (37.2 C)-99.7 F (37.6 C)] 99.7 F (37.6 C) (11/28 0600) Pulse Rate:  [75-87] 87 (11/28 0600) Resp:  [17-31] 23 (11/28 0600) BP: (142-179)/(41-54) 164/45 mmHg (11/28 0601) SpO2:  [88 %-99 %] 88 % (11/28 0600) Weight:  [61.3 kg (135 lb 2.3 oz)] 61.3 kg (135 lb 2.3 oz) (11/28 0500) Last BM Date: 05/16/14  Intake/Output from previous day: 11/27 0701 - 11/28 0700 In: 610 [I.V.:210; IV Piggyback:400] Out: 1385 [Urine:785; Emesis/NG output:600] Intake/Output this shift:    General appearance: alert and uncomfortable appearaing Resp: clear to auscultation bilaterally Cardio: RRR with 3/6 SEM GI: soft, non-tender; bowel sounds normal; no masses,  no organomegaly  Lab Results:  Recent Labs  05/15/14 0219 05/15/14 1755 05/16/14 0350 05/17/14 0344  WBC 9.5  --  8.7 9.2  HGB 11.1* 12.0 11.6* 12.0  HCT 34.1* 36.6 35.7* 36.4  PLT 212  --  296 352   BMET  Recent Labs  05/15/14 0219 05/16/14 0350 05/17/14 0344  NA 131* 133* 132*  K 4.8 4.6 4.1  CL 90* 91* 90*  CO2 31 31 26   GLUCOSE 94 70 69*  BUN 10 11 12   CREATININE 0.47* 0.44* 0.41*  CALCIUM 9.2 8.7 9.2   LFT  Recent Labs  05/16/14 0350  PROT 6.1  ALBUMIN 2.6*  AST 14  ALT <5  ALKPHOS 62  BILITOT 0.3  BILIDIR <0.2  IBILI NOT CALCULATED   PT/INR No results for input(s): LABPROT, INR in the last 72 hours. Hepatitis Panel No results for input(s): HEPBSAG, HCVAB, HEPAIGM, HEPBIGM in the last 72 hours. C-Diff No results for input(s): CDIFFTOX in the last 72 hours. Fecal Lactopherrin No results for input(s): FECLLACTOFRN in the last 72 hours.  Studies/Results: Dg Chest Port 1 View  05/15/2014   CLINICAL DATA:  Hypoxemia, smoker, hypertension  EXAM: PORTABLE CHEST - 1 VIEW  COMPARISON:  05/14/2014  FINDINGS: NG tube within the stomach, tip not visualized. Stable cardiomegaly with central vascular  congestion. Persistent right base collapse/ consolidation obscuring the hemidiaphragm with likely an associated effusion. Right base pneumonia not excluded. Stable left lung aeration. No pneumothorax. Atherosclerosis of the aorta.  IMPRESSION: Persistent right base collapse/consolidation and likely small effusion. Pneumonia not excluded.   Electronically Signed   By: Daryll Brod M.D.   On: 05/15/2014 10:55   Dg Abd Portable 1v  05/16/2014   CLINICAL DATA:  Ileus  EXAM: PORTABLE ABDOMEN - 1 VIEW  COMPARISON:  05/15/2014  FINDINGS: Nasogastric tube tip in the region of the pylorus.  Decreasing distention of small bowel in the left abdomen, 6-7 cm currently.  Chronic elevation the right diaphragm with superimposed pleural effusion.  The remainder of the abdomen is stable.  IMPRESSION: 1. Focal dilation of small bowel in the left abdomen is persistent, but gradually decreasing. 2. Nasogastric tube tip at the pylorus.   Electronically Signed   By: Jorje Guild M.D.   On: 05/16/2014 05:44   Dg Abd Portable 1v  05/15/2014   CLINICAL DATA:  Abdominal pain.  Question ileus.  EXAM: PORTABLE ABDOMEN - 1 VIEW  COMPARISON:  CT chest and abdomen 05/12/2014. Single view of the abdomen 11/23/2013 and 05/14/2014.  FINDINGS: Gaseous distention of a loop of bowel in the left upper quadrant is again seen. The loop measures 7.8 cm today compared to 9.6 cm yesterday. Basilar view of comparison CT, this is a  loop of small bowel. The examination is otherwise unremarkable.  IMPRESSION: Mild decrease in gaseous distention of a loop of small bowel in the left upper quadrant of the abdomen.   Electronically Signed   By: Inge Rise M.D.   On: 05/15/2014 10:16   US Abdomen Limited Ruq  05/16/2014   CLINICAL DATA:  Chest and abdominal pain, hypertension  EXAM: US ABDOMEN LIMITED - RIGHT UPPER QUADRANT  COMPARISON:  CT 05/12/2014  FINDINGS: Gallbladder:  No gallstones or wall thickening visualized. No sonographic Murphy sign  noted.  Common bile duct:  Diameter: 2.6 mm, unremarkable  Liver:  6 x 5 mm cyst in segment 7 as seen previously. No other focal lesion identified. Within normal limits in parenchymal echogenicity.  Right pleural effusion incidentally noted.  IMPRESSION: 1. Negative gallbladder. 2. Right pleural effusion.   Electronically Signed   By: Arne Cleveland M.D.   On: 05/16/2014 09:42    Medications:  Scheduled: . antiseptic oral rinse  7 mL Mouth Rinse BID  . aspirin  81 mg Oral q morning - 10a  . budesonide-formoterol  2 puff Inhalation BID  . calcitonin (salmon)  1 spray Alternating Nares Daily  . ceFEPime (MAXIPIME) IV  1 g Intravenous Q12H  . diltiazem  30 mg Oral 4 times per day  . heparin  5,000 Units Subcutaneous 3 times per day  . lidocaine  1 patch Transdermal Q24H  . nicotine  14 mg Transdermal Daily  . pantoprazole (PROTONIX) IV  40 mg Intravenous Q12H  . senna  1 tablet Oral BID  . sodium chloride  3 mL Intravenous Q12H  . tiotropium  18 mcg Inhalation Daily  . vancomycin  500 mg Intravenous BID   Continuous:   Assessment/Plan: 1) Focal small bowel ileus. 2) COPD.   I reviewed the KUB, but it has not been read by Radiology.  There appears to be more air in the bowel suggesting more movement of her intestines.  She states that she had a bowel movement.  I think the NG tube can be removed if there is an improvement, however, I am not sure I can convince the patient to continue with the tube if there was no improvement.  Plan: 1) Await KUB reading. 2) Most likely D/C NG tube this AM.   LOS: 5 days   Ante Arredondo D 05/17/2014, 7:30 AM

## 2014-05-17 NOTE — Progress Notes (Signed)
[place] for Cefepime/Vancomycin Indication: HCAP  Allergies  Allergen Reactions  . Tetanus Toxoid Anaphylaxis  . Codeine Nausea And Vomiting and Other (See Comments)    Itching and faint   . Erythromycin Nausea And Vomiting  . Meloxicam Itching  . Methylprednisolone Hives  . Penicillins Hives  . Prednisone Other (See Comments)    GI bleed    Patient Measurements: Height: 5\' 2"  (157.5 cm) Weight: 135 lb 2.3 oz (61.3 kg) IBW/kg (Calculated) : 50.1   Vital Signs: Temp: 99.1 F (37.3 C) (11/28 0800) Temp Source: Core (Comment) (11/28 0300) BP: 169/62 mmHg (11/28 0800) Pulse Rate: 96 (11/28 0800) Intake/Output from previous day: 11/27 0701 - 11/28 0700 In: 610 [I.V.:210; IV Piggyback:400] Out: 1385 [Urine:785; Emesis/NG output:600] Intake/Output from this shift: Total I/O In: 210 [I.V.:10; IV Piggyback:200] Out: 100 [Urine:100]  Labs:  Recent Labs  05/15/14 0219 05/15/14 1755 05/16/14 0350 05/17/14 0344  WBC 9.5  --  8.7 9.2  HGB 11.1* 12.0 11.6* 12.0  PLT 212  --  296 352  CREATININE 0.47*  --  0.44* 0.41*   Estimated Creatinine Clearance: 52.4 mL/min (by C-G formula based on Cr of 0.41). No results for input(s): VANCOTROUGH, VANCOPEAK, VANCORANDOM, GENTTROUGH, GENTPEAK, GENTRANDOM, TOBRATROUGH, TOBRAPEAK, TOBRARND, AMIKACINPEAK, AMIKACINTROU, AMIKACIN in the last 72 hours.   Microbiology: Recent Results (from the past 720 hour(s))  MRSA PCR Screening     Status: None   Collection Time: 05/14/14  4:54 AM  Result Value Ref Range Status   MRSA by PCR NEGATIVE NEGATIVE Final    Comment:        The GeneXpert MRSA Assay (FDA approved for NASAL specimens only), is one component of a comprehensive MRSA colonization surveillance program. It is not intended to diagnose MRSA infection nor to guide or monitor treatment for MRSA infections.   Culture, blood (routine x 2)     Status: None (Preliminary result)   Collection  Time: 05/14/14  6:45 AM  Result Value Ref Range Status   Specimen Description BLOOD RIGHT ANTECUBITAL  Final   Special Requests BOTTLES DRAWN AEROBIC AND ANAEROBIC 5CC  Final   Culture  Setup Time   Final    05/14/2014 09:02 Performed at [facility]    Culture   Final           BLOOD CULTURE RECEIVED NO GROWTH TO DATE CULTURE WILL BE HELD FOR 5 DAYS BEFORE ISSUING A FINAL NEGATIVE REPORT Performed at [facility]    Report Status PENDING  Incomplete  Culture, blood (routine x 2)     Status: None (Preliminary result)   Collection Time: 05/14/14  6:50 AM  Result Value Ref Range Status   Specimen Description BLOOD RIGHT HAND  Final   Special Requests BOTTLES DRAWN AEROBIC AND ANAEROBIC 5CC  Final   Culture  Setup Time   Final    05/14/2014 09:02 Performed at [facility]    Culture   Final           BLOOD CULTURE RECEIVED NO GROWTH TO DATE CULTURE WILL BE HELD FOR 5 DAYS BEFORE ISSUING A FINAL NEGATIVE REPORT Performed at [facility]    Report Status PENDING  Incomplete    Medical History: Past Medical History  Diagnosis Date  . HIP PAIN   . HYPERTENSION   . Restless leg syndrome   . COPD (chronic obstructive pulmonary disease)     a. Multiple prior admissions for acute hypoxic  respiratory failure in setting of COPD exacerbations +/- pneumonia.  . Peptic ulcer disease     a. 2013: gastric antrum.  Marland Kitchen Hyponatremia   . Tobacco abuse   . Hypomagnesemia   . LBBB (left bundle branch block)     a. Intermittent, likely rate related.  . Pulmonary HTN     a. Elevated PA pressure by prior echoes.  . Mitral regurgitation     a. Mild MR by echo 2014, not mentioned on 11/2013 (severely calcified annulus).  Marland Kitchen Respiratory failure     a. Adm 11/2013 with severe hypercarbic and hypoxemic respiratory failure requiring intubation.   . Abnormal echocardiogram     a. 11/2013: decreased EF 45-50%, moderate AS- > subsequent cath 12/02/13 with mildly calcified  cors without obstruction, mild AS, EF 60%, mild pulm HTN.  Marland Kitchen Aortic stenosis     a. 11/2013: mod by echo, then mild by cath.  . Pulmonary HTN     a. 11/2013: mild by cath.  . Wide-complex tachycardia     a. Felt likely atrial tach with rate-related LBBB.  Marland Kitchen Chronic diastolic CHF (congestive heart failure)     Medications:  Scheduled:  . antiseptic oral rinse  7 mL Mouth Rinse BID  . aspirin  81 mg Oral q morning - 10a  . budesonide-formoterol  2 puff Inhalation BID  . calcitonin (salmon)  1 spray Alternating Nares Daily  . ceFEPime (MAXIPIME) IV  1 g Intravenous Q12H  . diltiazem  30 mg Oral 4 times per day  . heparin  5,000 Units Subcutaneous 3 times per day  . lidocaine  1 patch Transdermal Q24H  . nicotine  14 mg Transdermal Daily  . pantoprazole (PROTONIX) IV  40 mg Intravenous Q12H  . senna  1 tablet Oral BID  . sodium chloride  3 mL Intravenous Q12H  . tiotropium  18 mcg Inhalation Daily  . vancomycin  500 mg Intravenous BID   Assessment: 31 yoF active smoker on PRN O2 at home with frequent COPD exacerbations admitted with chest and abck pain now hypoxic transferred to ICU. Cefepime/Vancomycin per Rx for HCAP.   11/25 >>cefepime >> 11/25 >>vancomycin >>   Tmax: AF WBCs: improved to WNL Renal: SCr 0.44, CrCl ~ 59 (CG, SCr 0.8)  11/25 blood: ngtd MRSA PCR: negative  Goal of Therapy:  Vancomycin trough level 15-20 mcg/ml  Appropriate antibiotic dosing for renal function; eradication of infection  Plan:   Continue Cefepime 1Gm IV q12h  Continue Vancomycin 500mg  IV q12h  Obtain Vancomycin trough prior to dose tonight  F/U SCr/levels/cultures as needed  Kizzie Furnish, PharmD Pager: [phone] 05/17/2014 10:31 AM

## 2014-05-17 NOTE — Progress Notes (Signed)
OT Cancellation Note  Patient Details Name: XEE HOLLMAN MRN: [id number] DOB: [birth year]   Cancelled Treatment:    Reason Eval/Treat Not Completed: Other (comment)Explained role of OT as pt questioned why OT had come to see her.  Pt feels she is close to baseline with ADL activity.  Pt had been up twice today.  Agreed OT will check on pt Monday and address ADL activity. Pt agrees.    Betsy Pries 05/17/2014, 2:52 PM

## 2014-05-17 NOTE — Progress Notes (Signed)
Pt compains of sudden onset, crushing chest pain. RN got EKG and gave 86mcg of PRN Fentanyl. Pt has had similar radiating pain that is from her back pain, but states that "it hasn't felt like this before." Kathline Magic called to update. Troponins ordered. Will continue to monitor.

## 2014-05-18 LAB — CBC WITH DIFFERENTIAL/PLATELET
Basophils Absolute: 0 10*3/uL (ref 0.0–0.1)
Basophils Relative: 0 % (ref 0–1)
Eosinophils Absolute: 0.1 10*3/uL (ref 0.0–0.7)
Eosinophils Relative: 2 % (ref 0–5)
HEMATOCRIT: 34.8 % — AB (ref 36.0–46.0)
HEMOGLOBIN: 11.7 g/dL — AB (ref 12.0–15.0)
LYMPHS PCT: 19 % (ref 12–46)
Lymphs Abs: 1.6 10*3/uL (ref 0.7–4.0)
MCH: 32.4 pg (ref 26.0–34.0)
MCHC: 33.6 g/dL (ref 30.0–36.0)
MCV: 96.4 fL (ref 78.0–100.0)
MONO ABS: 0.9 10*3/uL (ref 0.1–1.0)
MONOS PCT: 11 % (ref 3–12)
Neutro Abs: 5.7 10*3/uL (ref 1.7–7.7)
Neutrophils Relative %: 68 % (ref 43–77)
Platelets: 330 10*3/uL (ref 150–400)
RBC: 3.61 MIL/uL — ABNORMAL LOW (ref 3.87–5.11)
RDW: 12.4 % (ref 11.5–15.5)
WBC: 8.3 10*3/uL (ref 4.0–10.5)

## 2014-05-18 LAB — BASIC METABOLIC PANEL
Anion gap: 8 (ref 5–15)
BUN: 6 mg/dL (ref 6–23)
CHLORIDE: 91 meq/L — AB (ref 96–112)
CO2: 31 mEq/L (ref 19–32)
Calcium: 8.8 mg/dL (ref 8.4–10.5)
Creatinine, Ser: 0.4 mg/dL — ABNORMAL LOW (ref 0.50–1.10)
GFR calc non Af Amer: >90 mL/min (ref >90)
GLUCOSE: 104 mg/dL — AB (ref 70–99)
POTASSIUM: 4.1 meq/L (ref 3.7–5.3)
Sodium: 130 mEq/L — ABNORMAL LOW (ref 137–147)

## 2014-05-18 LAB — MAGNESIUM: Magnesium: 1.5 mg/dL (ref 1.5–2.5)

## 2014-05-18 MED ORDER — DILTIAZEM HCL ER COATED BEADS 120 MG PO CP24
120.0000 mg | ORAL_CAPSULE | Freq: Every day | ORAL | Status: DC
  Administered 2014-05-18 – 2014-05-22 (×5): 120 mg via ORAL
  Filled 2014-05-18 (×5): qty 1

## 2014-05-18 MED ORDER — FUROSEMIDE 20 MG PO TABS
20.0000 mg | ORAL_TABLET | Freq: Every day | ORAL | Status: DC
  Administered 2014-05-18 – 2014-05-19 (×2): 20 mg via ORAL
  Filled 2014-05-18 (×2): qty 1

## 2014-05-18 MED ORDER — LEVOFLOXACIN 500 MG PO TABS
500.0000 mg | ORAL_TABLET | Freq: Every day | ORAL | Status: DC
  Administered 2014-05-18 – 2014-05-19 (×2): 500 mg via ORAL
  Filled 2014-05-18 (×3): qty 1

## 2014-05-18 NOTE — Progress Notes (Signed)
[facility] PROGRESS NOTE  Jill White AYT:[id number] DOB: June 14, 1941 DOA: 05/12/2014 PCP: Laurey Morale, MD  Assessment/Plan: 1-Acute hypercapnic, hypoxic Respiratory Failure;  Secondary to oversedation, PNA.  Off BIPAP.  Received 5 days of Vancomycin and cefepime. Change to Levaquin for 2 more days.  Chest x ray with evidence of PNA.   2-PNA;  Repeated Chest X-ray 11-26; persistent right base collapse, and small effusion.  Received  Vancomycin cefepime for 5 days. Will change antibiotics to Levaquin.   3-Ileus, cecum dilation:  KUB decreasing focal dilation of small bowel.  Had a bowel movement.  Remove NG tube.  KUB with improvement dilation of bowel.  Tolerating clear diet, will advance diet to bland.   4-COPD: Continue with Spiriva, and Symbicort.   5-Acute on Chronic systolic, diastolic HF exacerbation.  Hold Cozaar, continue with Cardizem.  Repeated Chest X-ray 11-26; persistent right base collapse, and small effusion.  Resume lasix.   6-Chest Pain, Back pain:  Worsening T 6 Compression fracture:  Patient relates back pain, chest pain. She had Ct angio 11-23 which was negative for PE.  Troponin times 3 negative. Patient was evaluated by cardiology 11-23 who thought chest pain was neuropathic pain.  Per Dr Ronnald Ramp no need for kyphoplasty. Pain management, muscle relaxant, tramadol.  Palliative consulted for pain management.  Pain better, tolerating current dose of dilaudid and gabapentin.   7-DVT prophylaxis; Heparin.   Code Status: Full Code.  Family Communication: Care discussed with granddaughter.  Disposition Plan: transfer in 24 hour.    Consultants:  CCM.   Dr Benson Norway  Procedures:  none  Antibiotics: Cefepime 11-25---11-29 Vancomycin 11-25---11-29 Levaquin 11-29  HPI/Subjective: She feels she is breathing well, no dyspnea, no chest pain or back pain.  Pain is better controlled.  Had BM yesterday  Objective: Filed Vitals:   05/18/14  0800  BP: 157/53  Pulse: 73  Temp:   Resp: 19    Intake/Output Summary (Last 24 hours) at 05/18/14 0848 Last data filed at 05/18/14 0800  Gross per 24 hour  Intake   1310 ml  Output    600 ml  Net    710 ml   Filed Weights   05/16/14 0536 05/17/14 0500 05/18/14 0400  Weight: 61.8 kg (136 lb 3.9 oz) 61.3 kg (135 lb 2.3 oz) 61.3 kg (135 lb 2.3 oz)    Exam:   General:  Alert, awake.  In o distress.   Cardiovascular: S 1, S 2 RRR, systolic  murmur.   Respiratory: Bilateral air movement, bilateral crackles.   Abdomen: BS decrease, NT, no distended. No rigidity. Soft.  Musculoskeletal: no edema.   Data Reviewed: Basic Metabolic Panel:  Recent Labs Lab 05/13/14 0445 05/14/14 0533 05/15/14 0219 05/16/14 0350 05/17/14 0344 05/18/14 0408  NA 140 131* 131* 133* 132* 130*  K 3.6* 5.0 4.8 4.6 4.1 4.1  CL 105 90* 90* 91* 90* 91*  CO2 26 31 31 31 26 31   GLUCOSE 119* 128* 94 70 69* 104*  BUN 15 11 10 11 12 6   CREATININE 0.67 0.53 0.47* 0.44* 0.41* 0.40*  CALCIUM 6.9* 9.3 9.2 8.7 9.2 8.8  MG 1.4*  --  1.7 2.0 1.6 1.5  PHOS  --   --  3.3 2.6 2.5  --    Liver Function Tests:  Recent Labs Lab 05/12/14 1344 05/12/14 1446 05/13/14 0445 05/14/14 0533 05/16/14 0350  AST HEMOLYZED SPECIMEN, RESULTS MAY BE AFFECTED 15 12 15 14   ALT HEMOLYZED SPECIMEN, RESULTS MAY BE AFFECTED  12 9 10  <5  ALKPHOS HEMOLYZED SPECIMEN, RESULTS MAY BE AFFECTED 86 63 77 62  BILITOT HEMOLYZED SPECIMEN, RESULTS MAY BE AFFECTED 0.7 0.3 0.3 0.3  PROT HEMOLYZED SPECIMEN, RESULTS MAY BE AFFECTED 7.2 5.2* 7.0 6.1  ALBUMIN HEMOLYZED SPECIMEN, RESULTS MAY BE AFFECTED 3.6 2.5* 3.2* 2.6*    Recent Labs Lab 05/12/14 1344 05/12/14 1446  LIPASE 20 14   No results for input(s): AMMONIA in the last 168 hours. CBC:  Recent Labs Lab 05/14/14 0645 05/15/14 0219 05/15/14 1755 05/16/14 0350 05/17/14 0344 05/18/14 0408  WBC 13.8* 9.5  --  8.7 9.2 8.3  NEUTROABS 11.6* 7.3  --  6.5 6.8 5.7  HGB 12.1  11.1* 12.0 11.6* 12.0 11.7*  HCT 37.7 34.1* 36.6 35.7* 36.4 34.8*  MCV 100.3* 99.4  --  99.4 97.1 96.4  PLT 290 212  --  296 352 330   Cardiac Enzymes:  Recent Labs Lab 05/12/14 1623 05/14/14 1422 05/14/14 2041 05/15/14 0219 05/17/14 0344  TROPONINI <0.30 <0.30 <0.30 <0.30 <0.30   BNP (last 3 results)  Recent Labs  03/26/14 1005 05/12/14 1316 05/14/14 0533  PROBNP 94.0 577.4* 1351.0*   CBG:  Recent Labs Lab 05/17/14 0812  GLUCAP 85    Recent Results (from the past 240 hour(s))  MRSA PCR Screening     Status: None   Collection Time: 05/14/14  4:54 AM  Result Value Ref Range Status   MRSA by PCR NEGATIVE NEGATIVE Final    Comment:        The GeneXpert MRSA Assay (FDA approved for NASAL specimens only), is one component of a comprehensive MRSA colonization surveillance program. It is not intended to diagnose MRSA infection nor to guide or monitor treatment for MRSA infections.   Culture, blood (routine x 2)     Status: None (Preliminary result)   Collection Time: 05/14/14  6:45 AM  Result Value Ref Range Status   Specimen Description BLOOD RIGHT ANTECUBITAL  Final   Special Requests BOTTLES DRAWN AEROBIC AND ANAEROBIC 5CC  Final   Culture  Setup Time   Final    05/14/2014 09:02 Performed at [facility]    Culture   Final           BLOOD CULTURE RECEIVED NO GROWTH TO DATE CULTURE WILL BE HELD FOR 5 DAYS BEFORE ISSUING A FINAL NEGATIVE REPORT Performed at [facility]    Report Status PENDING  Incomplete  Culture, blood (routine x 2)     Status: None (Preliminary result)   Collection Time: 05/14/14  6:50 AM  Result Value Ref Range Status   Specimen Description BLOOD RIGHT HAND  Final   Special Requests BOTTLES DRAWN AEROBIC AND ANAEROBIC 5CC  Final   Culture  Setup Time   Final    05/14/2014 09:02 Performed at [facility]    Culture   Final           BLOOD CULTURE RECEIVED NO GROWTH TO DATE CULTURE WILL BE HELD FOR 5 DAYS  BEFORE ISSUING A FINAL NEGATIVE REPORT Performed at [facility]    Report Status PENDING  Incomplete     Studies: Dg Abd 1 View  05/17/2014   CLINICAL DATA:  Ileus.  EXAM: ABDOMEN - 1 VIEW  COMPARISON:  05/16/2014 and 05/15/2014  FINDINGS: There is decreased distention of the bowel loop in the left mid abdomen. After review of multiple prior studies, I have determined that this represents the cecum. The cecum is in  the upper midline on the prior CT scan of of 07/06/2011.  I do not think this represents a cecal volvulus.  The other bowel appears normal. No visible free air or free fluid on the supine radiograph.  IMPRESSION: Resolution of distention of the cecum in the left mid abdomen. Maximum diameter is approximately 7.5 cm at this time.   Electronically Signed   By: Rozetta Nunnery M.D.   On: 05/17/2014 08:16   US Abdomen Limited Ruq  05/16/2014   CLINICAL DATA:  Chest and abdominal pain, hypertension  EXAM: US ABDOMEN LIMITED - RIGHT UPPER QUADRANT  COMPARISON:  CT 05/12/2014  FINDINGS: Gallbladder:  No gallstones or wall thickening visualized. No sonographic Murphy sign noted.  Common bile duct:  Diameter: 2.6 mm, unremarkable  Liver:  6 x 5 mm cyst in segment 7 as seen previously. No other focal lesion identified. Within normal limits in parenchymal echogenicity.  Right pleural effusion incidentally noted.  IMPRESSION: 1. Negative gallbladder. 2. Right pleural effusion.   Electronically Signed   By: Arne Cleveland M.D.   On: 05/16/2014 09:42    Scheduled Meds: . antiseptic oral rinse  7 mL Mouth Rinse BID  . aspirin  81 mg Oral q morning - 10a  . budesonide-formoterol  2 puff Inhalation BID  . calcitonin (salmon)  1 spray Alternating Nares Daily  . diltiazem  120 mg Oral Daily  . furosemide  20 mg Oral Daily  . gabapentin  100 mg Oral TID  . heparin  5,000 Units Subcutaneous 3 times per day  . nicotine  14 mg Transdermal Daily  . pantoprazole (PROTONIX) IV  40 mg Intravenous  Q12H  . senna  1 tablet Oral BID  . sodium chloride  3 mL Intravenous Q12H  . tiotropium  18 mcg Inhalation Daily   Continuous Infusions:    Principal Problem:   Non-traumatic compression fracture of T6 thoracic vertebra Active Problems:   Essential hypertension   Hyponatremia   Tobacco abuse   COPD (chronic obstructive pulmonary disease)   Acute respiratory failure   Mild aortic stenosis   Acute on chronic combined systolic and diastolic congestive heart failure, NYHA class 4   Back pain   Chest pain   Compression deformity of vertebra   LBBB (left bundle branch block)   Thoracic spine fracture   Combined systolic and diastolic heart failure   Compression fracture of thoracic spine, non-traumatic   Acute on chronic respiratory failure   PNA (pneumonia)   Ileus   Aspiration pneumonia   Palliative care encounter   Restless leg syndrome    Time spent: 30 minutes.     Niel Hummer A  [facility] Pager [phone]. If 7PM-7AM, please contact night-coverage at [email or web address], password [facility] 05/18/2014, 8:48 AM  LOS: 6 days

## 2014-05-18 NOTE — Progress Notes (Signed)
Patient ST:Jill White      DOB: June 15, 1941      NLG:[id number]   Palliative Medicine Team at [facility] Progress Note    Subjective: Pain doing much better today.  Mainly just some light soreness. PRN dilaudid working very well. Not getting sedated.  Restless legs much better last night and slept well.  No N/V.  Moving bowels and passing flatus.      Filed Vitals:   05/18/14 0800  BP: 157/53  Pulse: 73  Temp: 98.8 F (37.1 C)  Resp: 19   Physical exam: General: Alert, NAD HEENT: [place], sclera anicteric Chest: CTAB CVS: RRR, +SEM Ext: No edema  CBC    Component Value Date/Time   WBC 8.3 05/18/2014 0408   RBC 3.61* 05/18/2014 0408   HGB 11.7* 05/18/2014 0408   HCT 34.8* 05/18/2014 0408   PLT 330 05/18/2014 0408   MCV 96.4 05/18/2014 0408   MCH 32.4 05/18/2014 0408   MCHC 33.6 05/18/2014 0408   RDW 12.4 05/18/2014 0408   LYMPHSABS 1.6 05/18/2014 0408   MONOABS 0.9 05/18/2014 0408   EOSABS 0.1 05/18/2014 0408   BASOSABS 0.0 05/18/2014 0408    CMP     Component Value Date/Time   NA 130* 05/18/2014 0408   K 4.1 05/18/2014 0408   CL 91* 05/18/2014 0408   CO2 31 05/18/2014 0408   GLUCOSE 104* 05/18/2014 0408   BUN 6 05/18/2014 0408   CREATININE 0.40* 05/18/2014 0408   CALCIUM 8.8 05/18/2014 0408   PROT 6.1 05/16/2014 0350   ALBUMIN 2.6* 05/16/2014 0350   AST 14 05/16/2014 0350   ALT <5 05/16/2014 0350   ALKPHOS 62 05/16/2014 0350   BILITOT 0.3 05/16/2014 0350   GFRNONAA >90 05/18/2014 0408   GFRAA >90 05/18/2014 0408      Assessment and plan: 73 yo female with COPD, HTN, tobacco abuse, admitted with back/chest pain in setting of T6 compression fx. Stay complicated by hypercarbic resp failure, ileus, and ongoing back/chest pain  1. Code Status: Full  2. Symptom Management:  1. Back Pain: Much better today. Would continue current regimen with PRN dilaudid, gabapentin 2. Hypercarbic resp failure- I agree that this most likely was 2/2  opioids.  3. RLS- continue neurontin.    3. Psychosocial/Spiritual: Lives at home in [place] Palliative Medicine Team at [facility]  Pager: [phone] Team Phone: [phone]

## 2014-05-19 DIAGNOSIS — L299 Pruritus, unspecified: Secondary | ICD-10-CM | POA: Insufficient documentation

## 2014-05-19 DIAGNOSIS — K5900 Constipation, unspecified: Secondary | ICD-10-CM

## 2014-05-19 LAB — CBC
HCT: 39.3 % (ref 36.0–46.0)
HEMOGLOBIN: 12.8 g/dL (ref 12.0–15.0)
MCH: 31.8 pg (ref 26.0–34.0)
MCHC: 32.6 g/dL (ref 30.0–36.0)
MCV: 97.5 fL (ref 78.0–100.0)
Platelets: 382 10*3/uL (ref 150–400)
RBC: 4.03 MIL/uL (ref 3.87–5.11)
RDW: 12.4 % (ref 11.5–15.5)
WBC: 10.6 10*3/uL — ABNORMAL HIGH (ref 4.0–10.5)

## 2014-05-19 LAB — BASIC METABOLIC PANEL
Anion gap: 13 (ref 5–15)
BUN: 6 mg/dL (ref 6–23)
CALCIUM: 9.5 mg/dL (ref 8.4–10.5)
CO2: 32 meq/L (ref 19–32)
Chloride: 86 mEq/L — ABNORMAL LOW (ref 96–112)
Creatinine, Ser: 0.48 mg/dL — ABNORMAL LOW (ref 0.50–1.10)
GFR calc Af Amer: >90 mL/min (ref >90)
GFR calc non Af Amer: >90 mL/min (ref >90)
Glucose, Bld: 92 mg/dL (ref 70–99)
Potassium: 3.8 mEq/L (ref 3.7–5.3)
SODIUM: 131 meq/L — AB (ref 137–147)

## 2014-05-19 MED ORDER — HYDROMORPHONE HCL 2 MG PO TABS
1.0000 mg | ORAL_TABLET | ORAL | Status: DC | PRN
  Administered 2014-05-19: 1 mg via ORAL
  Filled 2014-05-19: qty 1

## 2014-05-19 MED ORDER — POLYETHYLENE GLYCOL 3350 17 G PO PACK
17.0000 g | PACK | Freq: Every day | ORAL | Status: DC
  Administered 2014-05-20: 17 g via ORAL
  Filled 2014-05-19 (×3): qty 1

## 2014-05-19 MED ORDER — POLYETHYLENE GLYCOL 3350 17 G PO PACK
17.0000 g | PACK | Freq: Once | ORAL | Status: AC
  Administered 2014-05-19: 17 g via ORAL
  Filled 2014-05-19: qty 1

## 2014-05-19 MED ORDER — GABAPENTIN 100 MG PO CAPS
200.0000 mg | ORAL_CAPSULE | Freq: Three times a day (TID) | ORAL | Status: DC
  Administered 2014-05-19 – 2014-05-22 (×10): 200 mg via ORAL
  Filled 2014-05-19 (×11): qty 2

## 2014-05-19 NOTE — Plan of Care (Signed)
Problem: Phase I Progression Outcomes Goal: Initial discharge plan identified Outcome: Completed/Met Date Met:  05/19/14     

## 2014-05-19 NOTE — Evaluation (Signed)
Occupational Therapy Evaluation Patient Details Name: Jill White MRN: [id number] DOB: 11-24-1940 Today's Date: 05/19/2014    History of Present Illness This is a 73 year old female  who was admitted for chest pain; Pt found to have T6 compression fx (neurosurgery stated not a candidate for VP/KP and bracing unnecessary) ileus and pna;   PMH of HTN, COPD, and PUD    Clinical Impression   No further OT needed    Follow Up Recommendations  No OT follow up    Equipment Recommendations  None recommended by OT    Recommendations for Other Services       Precautions / Restrictions Precautions Precautions: Back Precaution Comments: reviewed back precautions but she does not adhere to them      Mobility Bed Mobility Overal bed mobility: Independent Bed Mobility: Supine to Sit;Sit to Supine     Supine to sit: Supervision Sit to supine: Supervision   General bed mobility comments: not acceptive of log roll technique  Transfers Overall transfer level: Needs assistance Equipment used: None Transfers: Sit to/from Stand Sit to Stand: Supervision         General transfer comment: verbal cues for safety and to slow down    Balance                                            ADL Overall ADL's : At baseline                                       General ADL Comments: Pt overall S- I with ADL activity . Granddaughter will provide care as needed     Vision                            Pertinent Vitals/Pain Pain Assessment: No/denies pain     Hand Dominance     Extremity/Trunk Assessment Upper Extremity Assessment Upper Extremity Assessment: Generalized weakness           Communication Communication Communication: No difficulties   Cognition Arousal/Alertness: Awake/alert Behavior During Therapy: WFL for tasks assessed/performed Overall Cognitive Status: Within Functional Limits for tasks assessed                      General Comments       Exercises       Shoulder Instructions      Home Living Family/patient expects to be discharged to:: Private residence Living Arrangements: Alone Available Help at Discharge: Family;Available 24 hours/day Type of Home: House (town) Home Access: Level entry     Home Layout: One level               Home Equipment: Environmental consultant - 2 wheels (borrowed her friend's walker)   Additional Comments: pt reports her dtr and or grand-dtr can provide 24hr care      Prior Functioning/Environment Level of Independence: Independent        Comments: step in shower with built in seat    OT Diagnosis: Generalized weakness   OT Problem List:     OT Treatment/Interventions:      OT Goals(Current goals can be found in the care plan section)    OT Frequency:     Barriers to  D/C:            Co-evaluation              End of Session    Activity Tolerance: Patient tolerated treatment well Patient left: in bed;with call bell/phone within reach   Time: 1347-1413 OT Time Calculation (min): 26 min Charges:  OT General Charges $OT Visit: 1 Procedure OT Evaluation $Initial OT Evaluation Tier I: 1 Procedure OT Treatments $Self Care/Home Management : 8-22 mins G-Codes:    Payton Mccallum D 06/07/2014, 2:25 PM

## 2014-05-19 NOTE — Progress Notes (Signed)
[facility] PROGRESS NOTE  Jill White ZOX:[id number] DOB: [birth year] DOA: 05/12/2014 PCP: Laurey Morale, MD  Assessment/Plan: 1-Acute hypercapnic, hypoxic Respiratory Failure;  Secondary to oversedation, PNA.  Off BIPAP.  Received 5 days of Vancomycin and cefepime. Change to Levaquin for 2 more days.  Chest x ray with evidence of PNA.  She will need Home oxygen.   2-PNA;  Repeated Chest X-ray 11-26; persistent right base collapse, and small effusion.  Received  Vancomycin cefepime for 5 days. On  Levaquin. Day 6/7 of antibiotics.   3-Ileus, cecum dilation:  KUB decreasing focal dilation of small bowel.  Had a bowel movement.  Remove NG tube.  KUB with improvement dilation of bowel.  Change diet to hearth healthy.  miralax today. Could try dulcolax.   4-COPD: Continue with Spiriva, and Symbicort.   5-Acute on Chronic systolic, diastolic HF exacerbation.  Hold Cozaar, continue with Cardizem.  Repeated Chest X-ray 11-26; persistent right base collapse, and small effusion.  Repeat labs in am, to repeat dose of lasix.   6-Chest Pain, Back pain:  Worsening T 6 Compression fracture:  Patient relates back pain, chest pain. She had Ct angio 11-23 which was negative for PE.  Troponin times 3 negative. Patient was evaluated by cardiology 11-23 who thought chest pain was neuropathic pain.  Per Dr Ronnald Ramp no need for kyphoplasty. Pain management, muscle relaxant, tramadol.  Palliative consulted for pain management.  Appreciate Dr Deitra Mayo. Gabapentin dose increased, dilaudid decrease.   7-DVT prophylaxis; Heparin.   Code Status: Full Code.  Family Communication: Care discussed with granddaughter.  Disposition Plan: transfer in 24 hour.    Consultants:  CCM.   Dr Benson Norway  Procedures:  none  Antibiotics: Cefepime 11-25---11-29 Vancomycin 11-25---11-29 Levaquin 11-29  HPI/Subjective: Feeling ok, breathing ok. Didn't eat very well. Doesn't have an appetite.  No BM  since yesterday.   Objective: Filed Vitals:   05/19/14 1351  BP: 126/46  Pulse: 78  Temp: 98.6 F (37 C)  Resp: 20    Intake/Output Summary (Last 24 hours) at 05/19/14 1733 Last data filed at 05/18/14 1800  Gross per 24 hour  Intake    240 ml  Output      0 ml  Net    240 ml   Filed Weights   05/17/14 0500 05/18/14 0400 05/19/14 0433  Weight: 61.3 kg (135 lb 2.3 oz) 61.3 kg (135 lb 2.3 oz) 61.2 kg (134 lb 14.7 oz)    Exam:   General:  Alert, awake.  In o distress.   Cardiovascular: S 1, S 2 RRR, systolic  murmur.   Respiratory: Bilateral air movement, bilateral crackles.   Abdomen: BS decrease, NT, no distended. No rigidity. Soft.  Musculoskeletal: no edema.   Data Reviewed: Basic Metabolic Panel:  Recent Labs Lab 05/13/14 0445  05/15/14 0219 05/16/14 0350 05/17/14 0344 05/18/14 0408 05/19/14 0433  NA 140  < > 131* 133* 132* 130* 131*  K 3.6*  < > 4.8 4.6 4.1 4.1 3.8  CL 105  < > 90* 91* 90* 91* 86*  CO2 26  < > 31 31 26 31  32  GLUCOSE 119*  < > 94 70 69* 104* 92  BUN 15  < > 10 11 12 6 6   CREATININE 0.67  < > 0.47* 0.44* 0.41* 0.40* 0.48*  CALCIUM 6.9*  < > 9.2 8.7 9.2 8.8 9.5  MG 1.4*  --  1.7 2.0 1.6 1.5  --   PHOS  --   --  3.3 2.6 2.5  --   --   < > = values in this interval not displayed. Liver Function Tests:  Recent Labs Lab 05/13/14 0445 05/14/14 0533 05/16/14 0350  AST 12 15 14   ALT 9 10 <5  ALKPHOS 63 77 62  BILITOT 0.3 0.3 0.3  PROT 5.2* 7.0 6.1  ALBUMIN 2.5* 3.2* 2.6*   No results for input(s): LIPASE, AMYLASE in the last 168 hours. No results for input(s): AMMONIA in the last 168 hours. CBC:  Recent Labs Lab 05/14/14 0645 05/15/14 0219 05/15/14 1755 05/16/14 0350 05/17/14 0344 05/18/14 0408 05/19/14 0433  WBC 13.8* 9.5  --  8.7 9.2 8.3 10.6*  NEUTROABS 11.6* 7.3  --  6.5 6.8 5.7  --   HGB 12.1 11.1* 12.0 11.6* 12.0 11.7* 12.8  HCT 37.7 34.1* 36.6 35.7* 36.4 34.8* 39.3  MCV 100.3* 99.4  --  99.4 97.1 96.4 97.5   PLT 290 212  --  296 352 330 382   Cardiac Enzymes:  Recent Labs Lab 05/14/14 1422 05/14/14 2041 05/15/14 0219 05/17/14 0344  TROPONINI <0.30 <0.30 <0.30 <0.30   BNP (last 3 results)  Recent Labs  03/26/14 1005 05/12/14 1316 05/14/14 0533  PROBNP 94.0 577.4* 1351.0*   CBG:  Recent Labs Lab 05/17/14 0812  GLUCAP 85    Recent Results (from the past 240 hour(s))  MRSA PCR Screening     Status: None   Collection Time: 05/14/14  4:54 AM  Result Value Ref Range Status   MRSA by PCR NEGATIVE NEGATIVE Final    Comment:        The GeneXpert MRSA Assay (FDA approved for NASAL specimens only), is one component of a comprehensive MRSA colonization surveillance program. It is not intended to diagnose MRSA infection nor to guide or monitor treatment for MRSA infections.   Culture, blood (routine x 2)     Status: None (Preliminary result)   Collection Time: 05/14/14  6:45 AM  Result Value Ref Range Status   Specimen Description BLOOD RIGHT ANTECUBITAL  Final   Special Requests BOTTLES DRAWN AEROBIC AND ANAEROBIC 5CC  Final   Culture  Setup Time   Final    05/14/2014 09:02 Performed at [facility]    Culture   Final           BLOOD CULTURE RECEIVED NO GROWTH TO DATE CULTURE WILL BE HELD FOR 5 DAYS BEFORE ISSUING A FINAL NEGATIVE REPORT Performed at [facility]    Report Status PENDING  Incomplete  Culture, blood (routine x 2)     Status: None (Preliminary result)   Collection Time: 05/14/14  6:50 AM  Result Value Ref Range Status   Specimen Description BLOOD RIGHT HAND  Final   Special Requests BOTTLES DRAWN AEROBIC AND ANAEROBIC 5CC  Final   Culture  Setup Time   Final    05/14/2014 09:02 Performed at [facility]    Culture   Final           BLOOD CULTURE RECEIVED NO GROWTH TO DATE CULTURE WILL BE HELD FOR 5 DAYS BEFORE ISSUING A FINAL NEGATIVE REPORT Performed at [facility]    Report Status PENDING  Incomplete      Studies: No results found.  Scheduled Meds: . aspirin  81 mg Oral q morning - 10a  . budesonide-formoterol  2 puff Inhalation BID  . calcitonin (salmon)  1 spray Alternating Nares Daily  . diltiazem  120 mg Oral Daily  .  gabapentin  200 mg Oral TID  . heparin  5,000 Units Subcutaneous 3 times per day  . levofloxacin  500 mg Oral Daily  . nicotine  14 mg Transdermal Daily  . [START ON 05/20/2014] polyethylene glycol  17 g Oral Daily  . senna  1 tablet Oral BID  . tiotropium  18 mcg Inhalation Daily   Continuous Infusions:    Principal Problem:   Non-traumatic compression fracture of T6 thoracic vertebra Active Problems:   Essential hypertension   Hyponatremia   Tobacco abuse   COPD (chronic obstructive pulmonary disease)   Acute respiratory failure   Mild aortic stenosis   Acute on chronic combined systolic and diastolic congestive heart failure, NYHA class 4   Back pain   Chest pain   Compression deformity of vertebra   LBBB (left bundle branch block)   Thoracic spine fracture   Combined systolic and diastolic heart failure   Compression fracture of thoracic spine, non-traumatic   Acute on chronic respiratory failure   PNA (pneumonia)   Ileus   Aspiration pneumonia   Palliative care encounter   Restless leg syndrome   CN (constipation)   Pruritus    Time spent: 30 minutes.     Niel Hummer A  [facility] Pager [phone]. If 7PM-7AM, please contact night-coverage at [email or web address], password [facility] 05/19/2014, 5:33 PM  LOS: 7 days

## 2014-05-19 NOTE — Progress Notes (Signed)
Patient Jill White      DOB: 05-03-1941      UDJ:[id number]   Palliative Medicine Team at [facility] Progress Note    Subjective: Pain mostly controlled. Went about 7hrs between doses of pain meds last night and that seemed to be associated with worse pain. Also noting some pruritis and nausea with pain meds.  Hasn't moved bowels today.  Passing flatus.    Filed Vitals:   05/19/14 0433  BP: 136/52  Pulse: 84  Temp: 98.9 F (37.2 C)  Resp: 20   Physical exam: General: Alert, NAD HEENT: [place], sclera anicteric Chest: CTAB CVS: RRR, +SEM Ext: No edema  CBC    Component Value Date/Time   WBC 10.6* 05/19/2014 0433   RBC 4.03 05/19/2014 0433   HGB 12.8 05/19/2014 0433   HCT 39.3 05/19/2014 0433   PLT 382 05/19/2014 0433   MCV 97.5 05/19/2014 0433   MCH 31.8 05/19/2014 0433   MCHC 32.6 05/19/2014 0433   RDW 12.4 05/19/2014 0433   LYMPHSABS 1.6 05/18/2014 0408   MONOABS 0.9 05/18/2014 0408   EOSABS 0.1 05/18/2014 0408   BASOSABS 0.0 05/18/2014 0408    CMP     Component Value Date/Time   NA 131* 05/19/2014 0433   K 3.8 05/19/2014 0433   CL 86* 05/19/2014 0433   CO2 32 05/19/2014 0433   GLUCOSE 92 05/19/2014 0433   BUN 6 05/19/2014 0433   CREATININE 0.48* 05/19/2014 0433   CALCIUM 9.5 05/19/2014 0433   PROT 6.1 05/16/2014 0350   ALBUMIN 2.6* 05/16/2014 0350   AST 14 05/16/2014 0350   ALT <5 05/16/2014 0350   ALKPHOS 62 05/16/2014 0350   BILITOT 0.3 05/16/2014 0350   GFRNONAA >90 05/19/2014 0433   GFRAA >90 05/19/2014 0433      Assessment and plan: 73 yo female with COPD, HTN, tobacco abuse, admitted with back/chest pain in setting of T6 compression fx. Stay complicated by hypercarbic resp failure, ileus, and ongoing back/chest pain  1. Code Status: Full  2. Symptom Management:  1. Back Pain: good control but some nausea and pruritis.  Will allow 1-2mg  range of dilaudid to see if lower dose controls pain and decreases side effects.  Titrate adjuvant gabapentin to 200mg  TID.   2. RLS- continue neurontin. 3. Pruritis- will try lower dose dilaudid. Increase gabapentin 4. Constipation- mild. Will have PRN dose of miralax given today.   3. Psychosocial/Spiritual: Lives at home in [place] Palliative Medicine Team at [facility]  Pager: [phone] Team Phone: [phone]

## 2014-05-19 NOTE — Progress Notes (Signed)
Physical Therapy Treatment Patient Details Name: Jill White MRN: [id number] DOB: [birth year] Today's Date: 2014/06/16    History of Present Illness This is a 73 year old female  who was admitted for chest pain; Pt found to have T6 compression fx (neurosurgery stated not a candidate for VP/KP and bracing unnecessary) ileus and pna;   PMH of HTN, COPD, and PUD     PT Comments    Pt ambulated in hallway.  Pt removed O2 [place] upon sitting upright however reports wearing O2 for activity at home so reapplied 3L O2 [place] for gait.  Pt denies back pain and not receptive of back precautions or log roll technique however verbally reviewed.   Follow Up Recommendations  No PT follow up;Supervision for mobility/OOB     Equipment Recommendations  None recommended by PT    Recommendations for Other Services       Precautions / Restrictions Precautions Precautions: Back Precaution Comments: reviewed back precautions but she does not adhere to them Restrictions Weight Bearing Restrictions: No    Mobility  Bed Mobility   Bed Mobility: Supine to Sit;Sit to Supine     Supine to sit: Supervision Sit to supine: Supervision   General bed mobility comments: not acceptive of log roll technique  Transfers Overall transfer level: Needs assistance Equipment used: None Transfers: Sit to/from Stand Sit to Stand: Min guard            Ambulation/Gait Ambulation/Gait assistance: Min guard Ambulation Distance (Feet): 320 Feet Assistive device: None Gait Pattern/deviations: Step-through pattern;Narrow base of support;Drifts right/left;Decreased stride length     General Gait Details: slightly unsteady however no LOB, ambulated on 3L O2  and SpO2 87% so performed breathing at halfway point, pt reports LLD   Stairs            Wheelchair Mobility    Modified Rankin (Stroke Patients Only)       Balance                                    Cognition  Arousal/Alertness: Awake/alert Behavior During Therapy: WFL for tasks assessed/performed Overall Cognitive Status: Within Functional Limits for tasks assessed                      Exercises      General Comments        Pertinent Vitals/Pain Pain Assessment: No/denies pain    Home Living                      Prior Function            PT Goals (current goals can now be found in the care plan section) Progress towards PT goals: Progressing toward goals    Frequency  Min 3X/week    PT Plan Current plan remains appropriate    Co-evaluation             End of Session Equipment Utilized During Treatment: Oxygen Activity Tolerance: Patient tolerated treatment well Patient left: in bed;with call bell/phone within reach     Time: 1004-1015 PT Time Calculation (min) (ACUTE ONLY): 11 min  Charges:  $Gait Training: 8-22 mins                    G Codes:      Gregg Holster,KATHrine E 2014-06-16, 11:57 AM Carmelia Bake, PT, DPT 06-16-2014 Pager: [phone]

## 2014-05-20 ENCOUNTER — Inpatient Hospital Stay (HOSPITAL_COMMUNITY): Payer: Medicare Other

## 2014-05-20 LAB — CBC
HEMATOCRIT: 37.8 % (ref 36.0–46.0)
HEMOGLOBIN: 12.5 g/dL (ref 12.0–15.0)
MCH: 31.6 pg (ref 26.0–34.0)
MCHC: 33.1 g/dL (ref 30.0–36.0)
MCV: 95.7 fL (ref 78.0–100.0)
Platelets: 415 10*3/uL — ABNORMAL HIGH (ref 150–400)
RBC: 3.95 MIL/uL (ref 3.87–5.11)
RDW: 12.4 % (ref 11.5–15.5)
WBC: 11.3 10*3/uL — AB (ref 4.0–10.5)

## 2014-05-20 LAB — CBC WITH DIFFERENTIAL/PLATELET
Basophils Absolute: 0 10*3/uL (ref 0.0–0.1)
Basophils Relative: 0 % (ref 0–1)
EOS ABS: 0 10*3/uL (ref 0.0–0.7)
EOS PCT: 0 % (ref 0–5)
HCT: 38.3 % (ref 36.0–46.0)
Hemoglobin: 12.8 g/dL (ref 12.0–15.0)
LYMPHS ABS: 1.9 10*3/uL (ref 0.7–4.0)
LYMPHS PCT: 14 % (ref 12–46)
MCH: 32.2 pg (ref 26.0–34.0)
MCHC: 33.4 g/dL (ref 30.0–36.0)
MCV: 96.2 fL (ref 78.0–100.0)
MONOS PCT: 11 % (ref 3–12)
Monocytes Absolute: 1.5 10*3/uL — ABNORMAL HIGH (ref 0.1–1.0)
Neutro Abs: 10.2 10*3/uL — ABNORMAL HIGH (ref 1.7–7.7)
Neutrophils Relative %: 75 % (ref 43–77)
Platelets: 410 10*3/uL — ABNORMAL HIGH (ref 150–400)
RBC: 3.98 MIL/uL (ref 3.87–5.11)
RDW: 12.3 % (ref 11.5–15.5)
WBC: 13.6 10*3/uL — AB (ref 4.0–10.5)

## 2014-05-20 LAB — CULTURE, BLOOD (ROUTINE X 2)
CULTURE: NO GROWTH
CULTURE: NO GROWTH

## 2014-05-20 LAB — URINALYSIS, ROUTINE W REFLEX MICROSCOPIC
GLUCOSE, UA: NEGATIVE mg/dL
Hgb urine dipstick: NEGATIVE
KETONES UR: NEGATIVE mg/dL
Nitrite: NEGATIVE
PH: 6.5 (ref 5.0–8.0)
Protein, ur: NEGATIVE mg/dL
Specific Gravity, Urine: 1.017 (ref 1.005–1.030)
Urobilinogen, UA: 1 mg/dL (ref 0.0–1.0)

## 2014-05-20 LAB — BASIC METABOLIC PANEL
Anion gap: 12 (ref 5–15)
BUN: 5 mg/dL — AB (ref 6–23)
CHLORIDE: 85 meq/L — AB (ref 96–112)
CO2: 33 meq/L — AB (ref 19–32)
Calcium: 9.3 mg/dL (ref 8.4–10.5)
Creatinine, Ser: 0.43 mg/dL — ABNORMAL LOW (ref 0.50–1.10)
GFR calc non Af Amer: >90 mL/min (ref >90)
GLUCOSE: 102 mg/dL — AB (ref 70–99)
Potassium: 4 mEq/L (ref 3.7–5.3)
Sodium: 130 mEq/L — ABNORMAL LOW (ref 137–147)

## 2014-05-20 LAB — INFLUENZA PANEL BY PCR (TYPE A & B)
H1N1FLUPCR: NOT DETECTED
Influenza A By PCR: NEGATIVE
Influenza B By PCR: NEGATIVE

## 2014-05-20 LAB — LACTIC ACID, PLASMA: Lactic Acid, Venous: 1.1 mmol/L (ref 0.5–2.2)

## 2014-05-20 LAB — URINE MICROSCOPIC-ADD ON

## 2014-05-20 MED ORDER — DEXTROSE 5 % IV SOLN
1.0000 g | Freq: Three times a day (TID) | INTRAVENOUS | Status: DC
  Administered 2014-05-20 – 2014-05-21 (×4): 1 g via INTRAVENOUS
  Filled 2014-05-20 (×5): qty 1

## 2014-05-20 MED ORDER — FUROSEMIDE 20 MG PO TABS
20.0000 mg | ORAL_TABLET | Freq: Every day | ORAL | Status: DC
  Administered 2014-05-20 – 2014-05-21 (×2): 20 mg via ORAL
  Filled 2014-05-20 (×2): qty 1

## 2014-05-20 MED ORDER — ACETAMINOPHEN 500 MG PO TABS
1000.0000 mg | ORAL_TABLET | Freq: Once | ORAL | Status: AC
  Administered 2014-05-20: 1000 mg via ORAL
  Filled 2014-05-20: qty 2

## 2014-05-20 MED ORDER — LEVOFLOXACIN IN D5W 750 MG/150ML IV SOLN
750.0000 mg | INTRAVENOUS | Status: DC
  Administered 2014-05-20 – 2014-05-21 (×2): 750 mg via INTRAVENOUS
  Filled 2014-05-20 (×2): qty 150

## 2014-05-20 MED ORDER — FLUCONAZOLE 150 MG PO TABS
150.0000 mg | ORAL_TABLET | Freq: Once | ORAL | Status: AC
  Administered 2014-05-20: 150 mg via ORAL
  Filled 2014-05-20: qty 1

## 2014-05-20 NOTE — Progress Notes (Signed)
ANTIBIOTIC CONSULT NOTE - INITIAL  Pharmacy Consult for cefepime, levofloxacin Indication: HAP  Allergies  Allergen Reactions  . Tetanus Toxoid Anaphylaxis  . Codeine Nausea And Vomiting and Other (See Comments)    Itching and faint   . Erythromycin Nausea And Vomiting  . Meloxicam Itching  . Methylprednisolone Hives  . Penicillins Hives  . Prednisone Other (See Comments)    GI bleed    Patient Measurements: Height: 5\' 2"  (157.5 cm) Weight: 135 lb 2.3 oz (61.3 kg) IBW/kg (Calculated) : 50.1   Vital Signs: Temp: 99.4 F (37.4 C) (12/01 0615) Temp Source: Oral (12/01 0615) BP: 134/65 mmHg (12/01 0430) Pulse Rate: 88 (12/01 0430) Intake/Output from previous day:   Intake/Output from this shift:    Labs:  Recent Labs  05/18/14 0408 05/19/14 0433 05/20/14 0420 05/20/14 0535  WBC 8.3 10.6* 11.3* 13.6*  HGB 11.7* 12.8 12.5 12.8  PLT 330 382 415* 410*  CREATININE 0.40* 0.48* 0.43*  --    Estimated Creatinine Clearance: 52.4 mL/min (by C-G formula based on Cr of 0.43).  Recent Labs  05/17/14 1921  [place] <5.0*     Microbiology: Recent Results (from the past 720 hour(s))  MRSA PCR Screening     Status: None   Collection Time: 05/14/14  4:54 AM  Result Value Ref Range Status   MRSA by PCR NEGATIVE NEGATIVE Final    Comment:        The GeneXpert MRSA Assay (FDA approved for NASAL specimens only), is one component of a comprehensive MRSA colonization surveillance program. It is not intended to diagnose MRSA infection nor to guide or monitor treatment for MRSA infections.   Culture, blood (routine x 2)     Status: None (Preliminary result)   Collection Time: 05/14/14  6:45 AM  Result Value Ref Range Status   Specimen Description BLOOD RIGHT ANTECUBITAL  Final   Special Requests BOTTLES DRAWN AEROBIC AND ANAEROBIC 5CC  Final   Culture  Setup Time   Final    05/14/2014 09:02 Performed at [facility]    Culture   Final           BLOOD  CULTURE RECEIVED NO GROWTH TO DATE CULTURE WILL BE HELD FOR 5 DAYS BEFORE ISSUING A FINAL NEGATIVE REPORT Performed at [facility]    Report Status PENDING  Incomplete  Culture, blood (routine x 2)     Status: None (Preliminary result)   Collection Time: 05/14/14  6:50 AM  Result Value Ref Range Status   Specimen Description BLOOD RIGHT HAND  Final   Special Requests BOTTLES DRAWN AEROBIC AND ANAEROBIC 5CC  Final   Culture  Setup Time   Final    05/14/2014 09:02 Performed at [facility]    Culture   Final           BLOOD CULTURE RECEIVED NO GROWTH TO DATE CULTURE WILL BE HELD FOR 5 DAYS BEFORE ISSUING A FINAL NEGATIVE REPORT Performed at [facility]    Report Status PENDING  Incomplete    Medical History: Past Medical History  Diagnosis Date  . HIP PAIN   . HYPERTENSION   . Restless leg syndrome   . COPD (chronic obstructive pulmonary disease)     a. Multiple prior admissions for acute hypoxic respiratory failure in setting of COPD exacerbations +/- pneumonia.  . Peptic ulcer disease     a. 2013: gastric antrum.  Marland Kitchen Hyponatremia   . Tobacco abuse   . Hypomagnesemia   .  LBBB (left bundle branch block)     a. Intermittent, likely rate related.  . Pulmonary HTN     a. Elevated PA pressure by prior echoes.  . Mitral regurgitation     a. Mild MR by echo 2014, not mentioned on 11/2013 (severely calcified annulus).  Marland Kitchen Respiratory failure     a. Adm 11/2013 with severe hypercarbic and hypoxemic respiratory failure requiring intubation.   . Abnormal echocardiogram     a. 11/2013: decreased EF 45-50%, moderate AS- > subsequent cath 12/02/13 with mildly calcified cors without obstruction, mild AS, EF 60%, mild pulm HTN.  Marland Kitchen Aortic stenosis     a. 11/2013: mod by echo, then mild by cath.  . Pulmonary HTN     a. 11/2013: mild by cath.  . Wide-complex tachycardia     a. Felt likely atrial tach with rate-related LBBB.  Marland Kitchen Chronic diastolic CHF (congestive heart  failure)     Medications:  Scheduled:  . aspirin  81 mg Oral q morning - 10a  . budesonide-formoterol  2 puff Inhalation BID  . calcitonin (salmon)  1 spray Alternating Nares Daily  . ceFEPime (MAXIPIME) IV  1 g Intravenous Q8H  . diltiazem  120 mg Oral Daily  . gabapentin  200 mg Oral TID  . heparin  5,000 Units Subcutaneous 3 times per day  . levofloxacin (LEVAQUIN) IV  750 mg Intravenous Q24H  . nicotine  14 mg Transdermal Daily  . polyethylene glycol  17 g Oral Daily  . senna  1 tablet Oral BID  . tiotropium  18 mcg Inhalation Daily   Infusions:   PRN: albuterol, bisacodyl, hydrALAZINE, ondansetron (ZOFRAN) IV, ondansetron   Assessment: 73 y/o F with acute hypercapnic hypoxic respiratory failure secondary to PNA and oversedation, treated earlier this admission with vancomycin and cefepime, then de-escalated to PO levofloxacin to complete therapy.  Now has T spike to 102, leukocytosis, and persistent infiltrate on CXR.  Orders received to resume IV cefepime and levofloxacin with pharmacy dosing assistance requested.  Goal of Therapy:  Appropriate antibiotic dosing for indication and renal function; eradication of infection.   Plan:  1. Cefepime 1 gram IV q8h 2. Levofloxacin 750 mg IV q24h 3. Follow renal function, cultures, clinical course.  Clayburn Pert, PharmD, BCPS Pager: [phone] 05/20/2014  7:11 AM

## 2014-05-20 NOTE — Progress Notes (Signed)
INITIAL NUTRITION ASSESSMENT  DOCUMENTATION CODES Per approved criteria  -Not Applicable   INTERVENTION: -Recommend [employer] Instant Breakfast once daily to provide 280 kcal, 13 gram protein -Encouraged PO intake -RD to continue to monitor  NUTRITION DIAGNOSIS: Inadequate oral intake related to decreased appetite/nausea as evidenced by PO intake < 75%.   Goal: Pt to meet >/= 90% of their estimated nutrition needs    Monitor:  Total protein/energy intake, labs, weights,   Reason for Assessment: MST  73 y.o. female  Admitting Dx: Non-traumatic compression fracture of T6 thoracic vertebra  ASSESSMENT: Jill White is a 73 y.o. female Chest pain started on Friday. Radiates to her back. Acute onset while stooling. Pain is constant and worsened w/ movement. Getting worse. Tramadol w/o benefit. Sharp in nature. Improves w/ rest. Associated w/ nausea.   -Pt reported prolonged period of poor PO intake. Pt unable to recall when loss of appetite began, however attributes it multiple medications, multiple co morbidities, and frequent hospitalizations over the past several months -Has been NPO/CL for 4 days ago d/t ileus/cecum dilation and had NGT placed -NGT removed and pt placed on Heart healthy diet on 11/30 -Current PO intake 50% -Pt willing to consume [employer] supplement as she dislikes sweetness of Ensure -Pt endorsed weight loss, unsure of amount or when weight loss began. Previous medical records indicate pt with 10 lb weight gain over past two months -No signs of muscle wasting or fat loss  Height: Ht Readings from Last 1 Encounters:  05/18/14 5\' 2"  (1.575 m)    Weight: Wt Readings from Last 1 Encounters:  05/20/14 135 lb 2.3 oz (61.3 kg)    Ideal Body Weight: 110 lb  % Ideal Body Weight: 123%  Wt Readings from Last 10 Encounters:  05/20/14 135 lb 2.3 oz (61.3 kg)  05/05/14 131 lb 6.4 oz (59.603 kg)  04/28/14 134 lb (60.782 kg)  03/14/14 126  lb (57.153 kg)  02/17/14 124 lb (56.246 kg)  01/24/14 118 lb (53.524 kg)  12/25/13 115 lb 12.8 oz (52.527 kg)  12/10/13 116 lb (52.617 kg)  12/03/13 117 lb 15.1 oz (53.5 kg)  11/22/13 123 lb (55.792 kg)    Usual Body Weight: 125-135 lb  % Usual Body Weight: 100%  BMI:  Body mass index is 24.71 kg/(m^2).  Estimated Nutritional Needs: Kcal: 1500-1700 Protein: 60-75 gram Fluid: >/= 1500 ml daily  Skin: WDL  Diet Order: Diet Heart  EDUCATION NEEDS: -No education needs identified at this time  No intake or output data in the 24 hours ending 05/20/14 1434  Last BM: 12/01   Labs:   Recent Labs Lab 05/15/14 0219 05/16/14 0350 05/17/14 0344 05/18/14 0408 05/19/14 0433 05/20/14 0420  NA 131* 133* 132* 130* 131* 130*  K 4.8 4.6 4.1 4.1 3.8 4.0  CL 90* 91* 90* 91* 86* 85*  CO2 31 31 26 31  32 33*  BUN 10 11 12 6 6  5*  CREATININE 0.47* 0.44* 0.41* 0.40* 0.48* 0.43*  CALCIUM 9.2 8.7 9.2 8.8 9.5 9.3  MG 1.7 2.0 1.6 1.5  --   --   PHOS 3.3 2.6 2.5  --   --   --   GLUCOSE 94 70 69* 104* 92 102*    CBG (last 3)  No results for input(s): GLUCAP in the last 72 hours.  Scheduled Meds: . aspirin  81 mg Oral q morning - 10a  . budesonide-formoterol  2 puff Inhalation BID  . calcitonin (salmon)  1  spray Alternating Nares Daily  . ceFEPime (MAXIPIME) IV  1 g Intravenous Q8H  . diltiazem  120 mg Oral Daily  . furosemide  20 mg Oral Daily  . gabapentin  200 mg Oral TID  . heparin  5,000 Units Subcutaneous 3 times per day  . levofloxacin (LEVAQUIN) IV  750 mg Intravenous Q24H  . nicotine  14 mg Transdermal Daily  . polyethylene glycol  17 g Oral Daily  . senna  1 tablet Oral BID  . tiotropium  18 mcg Inhalation Daily    Continuous Infusions:   Past Medical History  Diagnosis Date  . HIP PAIN   . HYPERTENSION   . Restless leg syndrome   . COPD (chronic obstructive pulmonary disease)     a. Multiple prior admissions for acute hypoxic respiratory failure in setting of  COPD exacerbations +/- pneumonia.  . Peptic ulcer disease     a. 2013: gastric antrum.  Marland Kitchen Hyponatremia   . Tobacco abuse   . Hypomagnesemia   . LBBB (left bundle branch block)     a. Intermittent, likely rate related.  . Pulmonary HTN     a. Elevated PA pressure by prior echoes.  . Mitral regurgitation     a. Mild MR by echo 2014, not mentioned on 11/2013 (severely calcified annulus).  Marland Kitchen Respiratory failure     a. Adm 11/2013 with severe hypercarbic and hypoxemic respiratory failure requiring intubation.   . Abnormal echocardiogram     a. 11/2013: decreased EF 45-50%, moderate AS- > subsequent cath 12/02/13 with mildly calcified cors without obstruction, mild AS, EF 60%, mild pulm HTN.  Marland Kitchen Aortic stenosis     a. 11/2013: mod by echo, then mild by cath.  . Pulmonary HTN     a. 11/2013: mild by cath.  . Wide-complex tachycardia     a. Felt likely atrial tach with rate-related LBBB.  Marland Kitchen Chronic diastolic CHF (congestive heart failure)     Past Surgical History  Procedure Laterality Date  . Abdominal hysterectomy    . Dilation and curettage of uterus    . Tonsillectomy    . Esophagogastroduodenoscopy  07/09/2011    Procedure: ESOPHAGOGASTRODUODENOSCOPY (EGD);  Surgeon: Missy Sabins, MD;  Location: [facility];  Service: Endoscopy;  Laterality: N/A;  patient in room 1532  . Cataract extraction w/ intraocular lens  implant, bilateral Bilateral 03/2013    Patient claims it was within the month.     Atlee Abide MS RD LDN Clinical Dietitian [phone]

## 2014-05-20 NOTE — Progress Notes (Signed)
Patient Jill White      DOB: 1940/08/07      QVZ:[id number]   Palliative Medicine Team at [facility] Progress Note    Subjective: Pain doing great. Not needing dilaudid.  Had fever overnight.  Deies SOB, N/V. Appetite continue to be poor. Moved bowels. Pruritis better   Filed Vitals:   05/20/14 1451  BP: 137/63  Pulse: 87  Temp: 98.9 F (37.2 C)  Resp: 18   Physical exam: General: Alert, NAD HEENT: [place], sclera anicteric Chest: CTAB CVS: RRR, +SEM Ext: No edema   CBC    Component Value Date/Time   WBC 13.6* 05/20/2014 0535   RBC 3.98 05/20/2014 0535   HGB 12.8 05/20/2014 0535   HCT 38.3 05/20/2014 0535   PLT 410* 05/20/2014 0535   MCV 96.2 05/20/2014 0535   MCH 32.2 05/20/2014 0535   MCHC 33.4 05/20/2014 0535   RDW 12.3 05/20/2014 0535   LYMPHSABS 1.9 05/20/2014 0535   MONOABS 1.5* 05/20/2014 0535   EOSABS 0.0 05/20/2014 0535   BASOSABS 0.0 05/20/2014 0535    CMP     Component Value Date/Time   NA 130* 05/20/2014 0420   K 4.0 05/20/2014 0420   CL 85* 05/20/2014 0420   CO2 33* 05/20/2014 0420   GLUCOSE 102* 05/20/2014 0420   BUN 5* 05/20/2014 0420   CREATININE 0.43* 05/20/2014 0420   CALCIUM 9.3 05/20/2014 0420   PROT 6.1 05/16/2014 0350   ALBUMIN 2.6* 05/16/2014 0350   AST 14 05/16/2014 0350   ALT <5 05/16/2014 0350   ALKPHOS 62 05/16/2014 0350   BILITOT 0.3 05/16/2014 0350   GFRNONAA >90 05/20/2014 0420   GFRAA >90 05/20/2014 0420      Assessment and plan: 73 yo female with COPD, HTN, tobacco abuse, admitted with back/chest pain in setting of T6 compression fx. Stay complicated by hypercarbic resp failure, ileus, and ongoing back/chest pain  1. Code Status: Full  2. Symptom Management:  1. Back Pain: continue gabapentin 200mg  TID. PRN dilaudid d/c'd (Im not sure why), but regardless she doesn't seem to be needing.   2. RLS- continue neurontin. 3. Pruritis- better today 4. Constipation- moved bowels  3.  Psychosocial/Spiritual: Lives at home in [place]  Dr Jill White takes over service for me tomorrow. We will follow Jill White peripherally. Please contact us if questions arise.   Doran Clay D.O. Palliative Medicine Team at [facility]  Pager: [phone] Team Phone: [phone]

## 2014-05-20 NOTE — Progress Notes (Signed)
[facility] PROGRESS NOTE  Jill White JJO:[id number] DOB: 1941/01/23 DOA: 05/12/2014 PCP: Laurey Morale, MD  Assessment/Plan: 73 year old with PMH significant for Diastolic  heart failure, COPD, admitted 11-23 with back pain, chest pain. She was found to have T 6 compression fracture. She was evaluated by cardiology who thought her chest pain back pain was related to compression fracture. Her hospitalization has been complicated by acute hypoxic, hypercapnic respiratory failure, AMS, ileus secondary to narcotics. She has been getting treatment for PNA> Her respiratory status has improved. She was on oral antibiotics until today 12-1 when she spike fever. pan culture re ordered. She was started on cefepime on 12-01. Her pain is better controlled, palliative care team is helping with pain management. Her ileus has resolved.   1-Acute hypercapnic, hypoxic Respiratory Failure; improved.  Secondary to oversedation, PNA, HF.  Off BIPAP.  Received 5 days of Vancomycin and cefepime. 3 day of Levaquin.  Chest x ray with evidence of PNA.  She will need Home oxygen.  Monitor electrolytes on lasix.   2-PNA;  Repeated Chest X-ray 11-26; persistent right base collapse, and small effusion.  Received  Vancomycin cefepime for 5 days. On  Levaquin. Day 7. Patient Spike fever. Repeated x ray persistent infiltrates. Antibiotics broad to Cefepime  and IV Levaquin.   3-Fever:  Patient spike fever; antibiotics change to cefepime and IV Levaquin.  Follow urine culture, blood culture.   4-Ileus, cecum dilation:  Remove NG tube.  KUB with improvement dilation of bowel.  Tolerating hearth healthy.  Continue with daily miralax. Dulcolax PRN.  Had BM 11-30  5-COPD: Continue with Spiriva, and Symbicort.   6-Acute on Chronic systolic, diastolic HF exacerbation.  Hold Cozaar, continue with Cardizem.  Repeated Chest X-ray 11-26; persistent right base collapse, and small effusion.  Persistent pulmonary  edema. Continue with low dose lasix, follow electrolytes.   7-Chest Pain, Back pain:  Worsening T 6 Compression fracture:  Patient relates back pain, chest pain. She had Ct angio 11-23 which was negative for PE.  Troponin times 3 negative. Patient was evaluated by cardiology 11-23 who thought chest pain was neuropathic pain.  Per Dr Ronnald Ramp no need for kyphoplasty. Pain management, muscle relaxant, tramadol.  Palliative consulted for pain management.  Appreciate Dr Deitra Mayo. Gabapentin dose increased, dilaudid decrease.   8-DVT prophylaxis; Heparin.   Code Status: Full Code.  Family Communication: Care discussed with patient.  Disposition Plan: home when stable.    Consultants:  CCM.   Dr Benson Norway  Procedures:  none  Antibiotics: Cefepime 11-25---11-29 Vancomycin 11-25---11-29 Levaquin 11-29  HPI/Subjective: She is feeling better, breathing ok, had 2 BM.  Feels some pressure in her bladder. Think she has UTI.   Objective: Filed Vitals:   05/20/14 0615  BP:   Pulse:   Temp: 99.4 F (37.4 C)  Resp:    No intake or output data in the 24 hours ending 05/20/14 1159 Filed Weights   05/18/14 0400 05/19/14 0433 05/20/14 0500  Weight: 61.3 kg (135 lb 2.3 oz) 61.2 kg (134 lb 14.7 oz) 61.3 kg (135 lb 2.3 oz)    Exam:   General:  Alert, awake.  In o distress.   Cardiovascular: S 1, S 2 RRR, systolic  murmur.   Respiratory: Bilateral air movement, bilateral crackles.   Abdomen: BS decrease, NT, no distended. No rigidity. Soft.  Musculoskeletal: no edema.   Data Reviewed: Basic Metabolic Panel:  Recent Labs Lab 05/15/14 0219 05/16/14 0350 05/17/14 0344 05/18/14 0408 05/19/14  5170 05/20/14 0420  NA 131* 133* 132* 130* 131* 130*  K 4.8 4.6 4.1 4.1 3.8 4.0  CL 90* 91* 90* 91* 86* 85*  CO2 31 31 26 31  32 33*  GLUCOSE 94 70 69* 104* 92 102*  BUN 10 11 12 6 6  5*  CREATININE 0.47* 0.44* 0.41* 0.40* 0.48* 0.43*  CALCIUM 9.2 8.7 9.2 8.8 9.5 9.3  MG 1.7 2.0 1.6 1.5   --   --   PHOS 3.3 2.6 2.5  --   --   --    Liver Function Tests:  Recent Labs Lab 05/14/14 0533 05/16/14 0350  AST 15 14  ALT 10 <5  ALKPHOS 77 62  BILITOT 0.3 0.3  PROT 7.0 6.1  ALBUMIN 3.2* 2.6*   No results for input(s): LIPASE, AMYLASE in the last 168 hours. No results for input(s): AMMONIA in the last 168 hours. CBC:  Recent Labs Lab 05/15/14 0219  05/16/14 0350 05/17/14 0344 05/18/14 0408 05/19/14 0433 05/20/14 0420 05/20/14 0535  WBC 9.5  --  8.7 9.2 8.3 10.6* 11.3* 13.6*  NEUTROABS 7.3  --  6.5 6.8 5.7  --   --  10.2*  HGB 11.1*  < > 11.6* 12.0 11.7* 12.8 12.5 12.8  HCT 34.1*  < > 35.7* 36.4 34.8* 39.3 37.8 38.3  MCV 99.4  --  99.4 97.1 96.4 97.5 95.7 96.2  PLT 212  --  296 352 330 382 415* 410*  < > = values in this interval not displayed. Cardiac Enzymes:  Recent Labs Lab 05/14/14 1422 05/14/14 2041 05/15/14 0219 05/17/14 0344  TROPONINI <0.30 <0.30 <0.30 <0.30   BNP (last 3 results)  Recent Labs  03/26/14 1005 05/12/14 1316 05/14/14 0533  PROBNP 94.0 577.4* 1351.0*   CBG:  Recent Labs Lab 05/17/14 0812  GLUCAP 85    Recent Results (from the past 240 hour(s))  MRSA PCR Screening     Status: None   Collection Time: 05/14/14  4:54 AM  Result Value Ref Range Status   MRSA by PCR NEGATIVE NEGATIVE Final    Comment:        The GeneXpert MRSA Assay (FDA approved for NASAL specimens only), is one component of a comprehensive MRSA colonization surveillance program. It is not intended to diagnose MRSA infection nor to guide or monitor treatment for MRSA infections.   Culture, blood (routine x 2)     Status: None   Collection Time: 05/14/14  6:45 AM  Result Value Ref Range Status   Specimen Description BLOOD RIGHT ANTECUBITAL  Final   Special Requests BOTTLES DRAWN AEROBIC AND ANAEROBIC 5CC  Final   Culture  Setup Time   Final    05/14/2014 09:02 Performed at [place]   Final    NO GROWTH 5  DAYS Performed at [facility]    Report Status 05/20/2014 FINAL  Final  Culture, blood (routine x 2)     Status: None   Collection Time: 05/14/14  6:50 AM  Result Value Ref Range Status   Specimen Description BLOOD RIGHT HAND  Final   Special Requests BOTTLES DRAWN AEROBIC AND ANAEROBIC 5CC  Final   Culture  Setup Time   Final    05/14/2014 09:02 Performed at [place]   Final    NO GROWTH 5 DAYS Performed at [facility]    Report Status 05/20/2014 FINAL  Final     Studies: Dg Chest  Port 1 View  05/20/2014   CLINICAL DATA:  Acute onset of shortness of breath. Initial encounter.  EXAM: PORTABLE CHEST - 1 VIEW  COMPARISON:  Chest radiograph performed 05/15/2014  FINDINGS: There is a persistent small right pleural effusion, with mildly improved right basilar airspace opacification. This may reflect mildly asymmetric interstitial edema or possibly pneumonia. Underlying vascular congestion is again seen. No pneumothorax is identified.  The cardiomediastinal silhouette is borderline normal in size. No acute osseous abnormalities are identified.  IMPRESSION: Persistent small right pleural effusion, with mildly improved right basilar airspace opacification. This may reflect mildly asymmetric interstitial edema or possibly pneumonia. Underlying vascular congestion again seen.   Electronically Signed   By: Garald Balding M.D.   On: 05/20/2014 06:26    Scheduled Meds: . aspirin  81 mg Oral q morning - 10a  . budesonide-formoterol  2 puff Inhalation BID  . calcitonin (salmon)  1 spray Alternating Nares Daily  . ceFEPime (MAXIPIME) IV  1 g Intravenous Q8H  . diltiazem  120 mg Oral Daily  . gabapentin  200 mg Oral TID  . heparin  5,000 Units Subcutaneous 3 times per day  . levofloxacin (LEVAQUIN) IV  750 mg Intravenous Q24H  . nicotine  14 mg Transdermal Daily  . polyethylene glycol  17 g Oral Daily  . senna  1 tablet Oral BID  . tiotropium  18 mcg  Inhalation Daily   Continuous Infusions:    Principal Problem:   Non-traumatic compression fracture of T6 thoracic vertebra Active Problems:   Essential hypertension   Hyponatremia   Tobacco abuse   COPD (chronic obstructive pulmonary disease)   Acute respiratory failure   Mild aortic stenosis   Acute on chronic combined systolic and diastolic congestive heart failure, NYHA class 4   Back pain   Chest pain   Compression deformity of vertebra   LBBB (left bundle branch block)   Thoracic spine fracture   Combined systolic and diastolic heart failure   Compression fracture of thoracic spine, non-traumatic   Acute on chronic respiratory failure   PNA (pneumonia)   Ileus   Aspiration pneumonia   Palliative care encounter   Restless leg syndrome   CN (constipation)   Pruritus    Time spent: 30 minutes.     Niel Hummer A  [facility] Pager [phone]. If 7PM-7AM, please contact night-coverage at [email or web address], password [facility] Hights 05/20/2014, 11:59 AM  LOS: 8 days

## 2014-05-21 ENCOUNTER — Other Ambulatory Visit: Payer: Medicare Other

## 2014-05-21 DIAGNOSIS — M8448XS Pathological fracture, other site, sequela: Secondary | ICD-10-CM

## 2014-05-21 DIAGNOSIS — M4854XS Collapsed vertebra, not elsewhere classified, thoracic region, sequela of fracture: Secondary | ICD-10-CM

## 2014-05-21 DIAGNOSIS — J9601 Acute respiratory failure with hypoxia: Secondary | ICD-10-CM

## 2014-05-21 LAB — CBC
HCT: 35.7 % — ABNORMAL LOW (ref 36.0–46.0)
Hemoglobin: 11.7 g/dL — ABNORMAL LOW (ref 12.0–15.0)
MCH: 31.7 pg (ref 26.0–34.0)
MCHC: 32.8 g/dL (ref 30.0–36.0)
MCV: 96.7 fL (ref 78.0–100.0)
PLATELETS: 423 10*3/uL — AB (ref 150–400)
RBC: 3.69 MIL/uL — ABNORMAL LOW (ref 3.87–5.11)
RDW: 12.4 % (ref 11.5–15.5)
WBC: 14.8 10*3/uL — ABNORMAL HIGH (ref 4.0–10.5)

## 2014-05-21 LAB — BASIC METABOLIC PANEL
ANION GAP: 9 (ref 5–15)
BUN: 6 mg/dL (ref 6–23)
CALCIUM: 9.1 mg/dL (ref 8.4–10.5)
CO2: 35 mEq/L — ABNORMAL HIGH (ref 19–32)
Chloride: 87 mEq/L — ABNORMAL LOW (ref 96–112)
Creatinine, Ser: 0.52 mg/dL (ref 0.50–1.10)
GFR calc Af Amer: >90 mL/min (ref >90)
Glucose, Bld: 107 mg/dL — ABNORMAL HIGH (ref 70–99)
Potassium: 4 mEq/L (ref 3.7–5.3)
SODIUM: 131 meq/L — AB (ref 137–147)

## 2014-05-21 LAB — CLOSTRIDIUM DIFFICILE BY PCR: CDIFFPCR: NEGATIVE

## 2014-05-21 MED ORDER — FUROSEMIDE 40 MG PO TABS
40.0000 mg | ORAL_TABLET | Freq: Every day | ORAL | Status: DC
  Administered 2014-05-22: 40 mg via ORAL
  Filled 2014-05-21: qty 1

## 2014-05-21 MED ORDER — FLUCONAZOLE 100 MG PO TABS
100.0000 mg | ORAL_TABLET | Freq: Every day | ORAL | Status: DC
  Administered 2014-05-21 – 2014-05-22 (×2): 100 mg via ORAL
  Filled 2014-05-21 (×2): qty 1

## 2014-05-21 NOTE — Plan of Care (Signed)
Problem: Phase II Progression Outcomes Goal: Vital signs remain stable Outcome: Completed/Met Date Met:  05/21/14     

## 2014-05-21 NOTE — Progress Notes (Signed)
PT Cancellation Note  Patient Details Name: Jill White MRN: [id number] DOB: 1940/11/15   Cancelled Treatment:     Pt declined PT session stating "I've been walking, you missed it".  Pt also stated she is very capable and feels she does not need Physical Therapy.  Will update LPT.   Rica Koyanagi  PTA [facility]  Acute  Rehab Pager      [phone]

## 2014-05-21 NOTE — Progress Notes (Signed)
[facility] PROGRESS NOTE  Jill White XFG:[id number] DOB: [birth year] DOA: 05/12/2014 PCP: Laurey Morale, MD  Assessment/Plan: 73 year old with PMH significant for Diastolic  heart failure, COPD, admitted 11-23 with back pain, chest pain. She was found to have T 6 compression fracture. She was evaluated by cardiology who thought her chest pain back pain was related to compression fracture. Her hospitalization has been complicated by acute hypoxic, hypercapnic respiratory failure, AMS, ileus secondary to narcotics. She has been getting treatment for PNA> Her respiratory status has improved. She was on oral antibiotics until today 12-1 when she spike fever. pan culture re ordered. She was started on cefepime on 12-01. Her pain is better controlled, palliative care team is helping with pain management. Her ileus has resolved.   1-Acute hypercapnic, hypoxic Respiratory Failure; improved.  Multifactorial, due to Secondary to oversedation, PNA, HF.  Off BIPAP and with good O2 sat on 2L  Received 5 days of Vancomycin and 6 days of cefepime. Also completed a total of 8 days of Levaquin.  Chest x ray with infiltrates; but most likely lack of clearing from recent infection Currently afebrile She will need Home oxygen.  Will Monitor electrolytes on lasix and replete as needed  2-PNA;  Repeated Chest X-ray 11-26; persistent right base collapse, and small effusion.  Received  Vancomycin/cefepime for 6 days and Levaquin X 8 days Encourage use of flutter valve Will hold on antibiotics for now  3-Fever:  -Follow urine culture, blood culture. So far no growth -antibiotics now discontinued -will monitor patient off abx's  4-Ileus, cecum dilation:  -Continue with daily miralax and Dulcolax PRN.  -patient now with loose stools; will monitor and discontinue laxatives if she develops diarrhea. -c. Diff ordered; as patient spike fever and has worsening elevation of WBC's  5-COPD: -Continue with  Spiriva, and Symbicort.  -no wheezing  6-Acute on Chronic systolic, diastolic HF exacerbation.  -Cozaar on hold, continue with Cardizem.  -continue lasix -continue oxygen supplementation as needed  7-Chest Pain, Back pain:  Worsening T 6 Compression fracture:  -Patient reported back pain and chest pain are much better. She had Ct angio 11-23 which was negative for PE.  -Troponin times 3 negative. Patient was evaluated by cardiology 11-23 who thought chest pain was neuropathic in nature and recommended no further work up.  -Per Dr Ronnald Ramp (neurosurgery) no need for kyphoplasty.  -Palliative consulted for pain management; Appreciate Dr Deitra Mayo consultation and rec's. Gabapentin dose increased, dilaudid currently discontinued. Will use PRN tramadol and robaxin at discharge to assist with pain.Marland Kitchen   8-Yeast: will treat with 3 days of diflucan  DVT prophylaxis; continue Heparin.   Code Status: Full Code.  Family Communication: Care discussed with patient.  Disposition Plan: home when stable.    Consultants:  CC[facility].   Dr Benson Norway (GI)  Dr. Ronnald Ramp (Neurosurgery)  Procedures:  See below for x-ray reports   Antibiotics: Cefepime 11-25---11-29 Vancomycin 11-25---11-29 Levaquin 11-29>>12/2  HPI/Subjective: Feeling better, breathing easier. Afebrile today. Patient complaining of dysuria and loose stools. Patient back pain is well controlled with neurontin.  Objective: Filed Vitals:   05/21/14 1327  BP: 121/75  Pulse: 53  Temp: 98.5 F (36.9 C)  Resp: 18    Intake/Output Summary (Last 24 hours) at 05/21/14 1917 Last data filed at 05/21/14 1700  Gross per 24 hour  Intake    720 ml  Output   1690 ml  Net   -970 ml   Filed Weights   05/19/14 0433 05/20/14  0500 05/21/14 0449  Weight: 61.2 kg (134 lb 14.7 oz) 61.3 kg (135 lb 2.3 oz) 58.287 kg (128 lb 8 oz)    Exam:   General:  Alert, awake.  In o distress. Breathing much better and currently afebrile.  Cardiovascular: S 1, S  2 RRR, systolic  murmur.   Respiratory: improve air movement, positive rhonchi and fine crackles at bases  Abdomen: BS decrease, NT, no distended. No guarding. Soft.  Musculoskeletal: no edema.   Data Reviewed: Basic Metabolic Panel:  Recent Labs Lab 05/15/14 0219 05/16/14 0350 05/17/14 0344 05/18/14 0408 05/19/14 0433 05/20/14 0420 05/21/14 0409  NA 131* 133* 132* 130* 131* 130* 131*  K 4.8 4.6 4.1 4.1 3.8 4.0 4.0  CL 90* 91* 90* 91* 86* 85* 87*  CO2 31 31 26 31  32 33* 35*  GLUCOSE 94 70 69* 104* 92 102* 107*  BUN 10 11 12 6 6  5* 6  CREATININE 0.47* 0.44* 0.41* 0.40* 0.48* 0.43* 0.52  CALCIUM 9.2 8.7 9.2 8.8 9.5 9.3 9.1  MG 1.7 2.0 1.6 1.5  --   --   --   PHOS 3.3 2.6 2.5  --   --   --   --    Liver Function Tests:  Recent Labs Lab 05/16/14 0350  AST 14  ALT <5  ALKPHOS 62  BILITOT 0.3  PROT 6.1  ALBUMIN 2.6*   CBC:  Recent Labs Lab 05/15/14 0219  05/16/14 0350 05/17/14 0344 05/18/14 0408 05/19/14 0433 05/20/14 0420 05/20/14 0535 05/21/14 0409  WBC 9.5  --  8.7 9.2 8.3 10.6* 11.3* 13.6* 14.8*  NEUTROABS 7.3  --  6.5 6.8 5.7  --   --  10.2*  --   HGB 11.1*  < > 11.6* 12.0 11.7* 12.8 12.5 12.8 11.7*  HCT 34.1*  < > 35.7* 36.4 34.8* 39.3 37.8 38.3 35.7*  MCV 99.4  --  99.4 97.1 96.4 97.5 95.7 96.2 96.7  PLT 212  --  296 352 330 382 415* 410* 423*  < > = values in this interval not displayed. Cardiac Enzymes:  Recent Labs Lab 05/14/14 2041 05/15/14 0219 05/17/14 0344  TROPONINI <0.30 <0.30 <0.30   BNP (last 3 results)  Recent Labs  03/26/14 1005 05/12/14 1316 05/14/14 0533  PROBNP 94.0 577.4* 1351.0*   CBG:  Recent Labs Lab 05/17/14 0812  GLUCAP 85    Recent Results (from the past 240 hour(s))  MRSA PCR Screening     Status: None   Collection Time: 05/14/14  4:54 AM  Result Value Ref Range Status   MRSA by PCR NEGATIVE NEGATIVE Final    Comment:        The GeneXpert MRSA Assay (FDA approved for NASAL specimens only), is one  component of a comprehensive MRSA colonization surveillance program. It is not intended to diagnose MRSA infection nor to guide or monitor treatment for MRSA infections.   Culture, blood (routine x 2)     Status: None   Collection Time: 05/14/14  6:45 AM  Result Value Ref Range Status   Specimen Description BLOOD RIGHT ANTECUBITAL  Final   Special Requests BOTTLES DRAWN AEROBIC AND ANAEROBIC 5CC  Final   Culture  Setup Time   Final    05/14/2014 09:02 Performed at [facility]   Final    NO GROWTH 5 DAYS Performed at [facility]    Report Status 05/20/2014 FINAL  Final  Culture, blood (routine x 2)  Status: None   Collection Time: 05/14/14  6:50 AM  Result Value Ref Range Status   Specimen Description BLOOD RIGHT HAND  Final   Special Requests BOTTLES DRAWN AEROBIC AND ANAEROBIC 5CC  Final   Culture  Setup Time   Final    05/14/2014 09:02 Performed at [facility]    Culture   Final    NO GROWTH 5 DAYS Performed at [facility]    Report Status 05/20/2014 FINAL  Final  Culture, blood (routine x 2)     Status: None (Preliminary result)   Collection Time: 05/20/14  5:30 AM  Result Value Ref Range Status   Specimen Description BLOOD LEFT ARM  Final   Special Requests BOTTLES DRAWN AEROBIC AND ANAEROBIC 10CC  Final   Culture  Setup Time   Final    05/20/2014 08:50 Performed at [facility]    Culture   Final           BLOOD CULTURE RECEIVED NO GROWTH TO DATE CULTURE WILL BE HELD FOR 5 DAYS BEFORE ISSUING A FINAL NEGATIVE REPORT Performed at [facility]    Report Status PENDING  Incomplete  Culture, blood (routine x 2)     Status: None (Preliminary result)   Collection Time: 05/20/14  5:35 AM  Result Value Ref Range Status   Specimen Description BLOOD LEFT HAND  Final   Special Requests BOTTLES DRAWN AEROBIC ONLY [place]  Final   Culture  Setup Time   Final    05/20/2014 08:39 Performed at [facility]    Culture   Final           BLOOD CULTURE RECEIVED NO GROWTH TO DATE CULTURE WILL BE HELD FOR 5 DAYS BEFORE ISSUING A FINAL NEGATIVE REPORT Performed at [facility]    Report Status PENDING  Incomplete  Clostridium Difficile by PCR     Status: None   Collection Time: 05/21/14  1:57 PM  Result Value Ref Range Status   C difficile by pcr NEGATIVE NEGATIVE Final    Comment: Performed at [facility]     Studies: Dg Chest Port 1 View  05/20/2014   CLINICAL DATA:  Acute onset of shortness of breath. Initial encounter.  EXAM: PORTABLE CHEST - 1 VIEW  COMPARISON:  Chest radiograph performed 05/15/2014  FINDINGS: There is a persistent small right pleural effusion, with mildly improved right basilar airspace opacification. This may reflect mildly asymmetric interstitial edema or possibly pneumonia. Underlying vascular congestion is again seen. No pneumothorax is identified.  The cardiomediastinal silhouette is borderline normal in size. No acute osseous abnormalities are identified.  IMPRESSION: Persistent small right pleural effusion, with mildly improved right basilar airspace opacification. This may reflect mildly asymmetric interstitial edema or possibly pneumonia. Underlying vascular congestion again seen.   Electronically Signed   By: Garald Balding M.D.   On: 05/20/2014 06:26    Scheduled Meds: . aspirin  81 mg Oral q morning - 10a  . budesonide-formoterol  2 puff Inhalation BID  . calcitonin (salmon)  1 spray Alternating Nares Daily  . diltiazem  120 mg Oral Daily  . fluconazole  100 mg Oral Daily  . furosemide  20 mg Oral Daily  . gabapentin  200 mg Oral TID  . heparin  5,000 Units Subcutaneous 3 times per day  . nicotine  14 mg Transdermal Daily  . polyethylene glycol  17 g Oral Daily  . senna  1 tablet Oral BID  .  tiotropium  18 mcg Inhalation Daily   Continuous Infusions:    Principal Problem:   Non-traumatic compression fracture of T6 thoracic  vertebra Active Problems:   Essential hypertension   Hyponatremia   Tobacco abuse   COPD (chronic obstructive pulmonary disease)   Acute respiratory failure   Mild aortic stenosis   Acute on chronic combined systolic and diastolic congestive heart failure, NYHA class 4   Back pain   Chest pain   Compression deformity of vertebra   LBBB (left bundle branch block)   Thoracic spine fracture   Combined systolic and diastolic heart failure   Compression fracture of thoracic spine, non-traumatic   Acute on chronic respiratory failure   PNA (pneumonia)   Ileus   Aspiration pneumonia   Palliative care encounter   Restless leg syndrome   CN (constipation)   Pruritus    Time spent: 30 minutes.     Barton Dubois  [facility] Pager [phone]. If 7PM-7AM, please contact night-coverage at [email or web address], password [email or web address] 05/21/2014, 7:17 PM  LOS: 9 days

## 2014-05-21 NOTE — Progress Notes (Signed)
Pt states she couldn't find the one she thought she had and asked to get her the one we were going to bring her.

## 2014-05-21 NOTE — Progress Notes (Signed)
PT refused Flutter device- states she already has one.

## 2014-05-22 LAB — URINE CULTURE
COLONY COUNT: NO GROWTH
CULTURE: NO GROWTH

## 2014-05-22 LAB — CBC
HEMATOCRIT: 34.4 % — AB (ref 36.0–46.0)
Hemoglobin: 11.2 g/dL — ABNORMAL LOW (ref 12.0–15.0)
MCH: 30.9 pg (ref 26.0–34.0)
MCHC: 32.6 g/dL (ref 30.0–36.0)
MCV: 95 fL (ref 78.0–100.0)
Platelets: 465 10*3/uL — ABNORMAL HIGH (ref 150–400)
RBC: 3.62 MIL/uL — ABNORMAL LOW (ref 3.87–5.11)
RDW: 12.5 % (ref 11.5–15.5)
WBC: 10.4 10*3/uL (ref 4.0–10.5)

## 2014-05-22 LAB — BASIC METABOLIC PANEL
Anion gap: 13 (ref 5–15)
BUN: 6 mg/dL (ref 6–23)
CO2: 32 mEq/L (ref 19–32)
Calcium: 9.2 mg/dL (ref 8.4–10.5)
Chloride: 87 mEq/L — ABNORMAL LOW (ref 96–112)
Creatinine, Ser: 0.43 mg/dL — ABNORMAL LOW (ref 0.50–1.10)
GFR calc Af Amer: >90 mL/min (ref >90)
GLUCOSE: 101 mg/dL — AB (ref 70–99)
Potassium: 3.7 mEq/L (ref 3.7–5.3)
SODIUM: 132 meq/L — AB (ref 137–147)

## 2014-05-22 MED ORDER — SACCHAROMYCES BOULARDII 250 MG PO CAPS: 250.0000 mg | ORAL_CAPSULE | Freq: Two times a day (BID) | ORAL | Status: DC

## 2014-05-22 MED ORDER — NICOTINE 14 MG/24HR TD PT24: 14.0000 mg | MEDICATED_PATCH | Freq: Every day | TRANSDERMAL | Status: DC

## 2014-05-22 MED ORDER — GABAPENTIN 100 MG PO CAPS: 200.0000 mg | ORAL_CAPSULE | Freq: Three times a day (TID) | ORAL | Status: DC

## 2014-05-22 MED ORDER — FUROSEMIDE 20 MG PO TABS: 30.0000 mg | ORAL_TABLET | Freq: Every day | ORAL | Status: DC

## 2014-05-22 MED ORDER — TRAMADOL HCL 50 MG PO TBDP: 1.0000 | ORAL_TABLET | Freq: Four times a day (QID) | ORAL | Status: DC | PRN

## 2014-05-22 MED ORDER — FLUCONAZOLE 100 MG PO TABS: 100.0000 mg | ORAL_TABLET | Freq: Every day | ORAL | Status: AC

## 2014-05-22 MED ORDER — POLYETHYLENE GLYCOL 3350 17 G PO PACK: 17.0000 g | PACK | Freq: Every day | ORAL | Status: DC | PRN

## 2014-05-22 MED ORDER — CALCITONIN (SALMON) 200 UNIT/ACT NA SOLN: 1.0000 | Freq: Every day | NASAL | Status: DC

## 2014-05-22 NOTE — Progress Notes (Signed)
D/C instructions reviewed w/ pt. All questions answered, no further questions. Pt d/c in stable condition in w/c to granddaughters car in stable condition. Pt in possession of d/c instructions, scripts, and all personal belongings. Heart failure instructions reviewed.

## 2014-05-22 NOTE — Plan of Care (Signed)
Problem: Problem: Cardiovascular Progression Goal: HEMODYNAMIC STABILITY Outcome: Completed/Met Date Met:  05/22/14 Goal: NO ARRHYTHMIAS Outcome: Completed/Met Date Met:  05/22/14 Goal: NO EVIDENCE OF VOLUME OVERLOAD Outcome: Completed/Met Date Met:  05/22/14 Goal: PULSES PRESENT/NOT DIMINISHED Outcome: Completed/Met Date Met:  05/22/14 Goal: NO VASCULAR COMPLICATION Outcome: Completed/Met Date Met:  05/22/14

## 2014-05-22 NOTE — Discharge Summary (Signed)
Physician Discharge Summary  Jill White:[id number] DOB: Apr 25, 1941 DOA: 05/12/2014  PCP: Jill Morale, MD  Admit date: 05/12/2014 Discharge date: 05/22/2014  Time spent: >30 minutes  Recommendations for Outpatient Follow-up:  Follow CBC to evaluate Hgb and WBC's trend Check BMET to follow electrolytes and renal function Reassess BP and adjust medications as needed  Discharge Diagnoses:  Principal Problem:   Non-traumatic compression fracture of T6 thoracic vertebra Active Problems:   Essential hypertension   Hyponatremia   Tobacco abuse   COPD (chronic obstructive pulmonary disease)   Acute respiratory failure   Mild aortic stenosis   Acute on chronic combined systolic and diastolic congestive heart failure, NYHA class 4   Back pain   Chest pain   Compression deformity of vertebra   LBBB (left bundle branch block)   Thoracic spine fracture   Combined systolic and diastolic heart failure   Compression fracture of thoracic spine, non-traumatic   Acute on chronic respiratory failure   PNA (pneumonia)   Ileus   Aspiration pneumonia   Palliative care encounter   Restless leg syndrome   CN (constipation)   Pruritus   Discharge Condition: stable and improved. Will discharge home.  Diet recommendation: low sodium Heart healthy  Filed Weights   05/20/14 0500 05/21/14 0449 05/22/14 0550  Weight: 61.3 kg (135 lb 2.3 oz) 58.287 kg (128 lb 8 oz) 57.516 kg (126 lb 12.8 oz)    History of present illness:  73 year old with PMH significant for Diastolic heart failure, COPD, admitted 11-23 with back pain, chest pain. She was found to have T 6 compression fracture.   Hospital Course:  1-Acute on chronic hypercapnic, hypoxic Respiratory Failure Multifactorial, due to oversedation, PNA, acute on chronic HF.  Off BIPAP and with good O2 sat on 2L (chronic supplementation)  Received 5 days of Vancomycin and 6 days of cefepime. Also completed a total of 8 days of Levaquin.   Chest x ray with infiltrates; but most likely lack of clearing from recent infection Currently afebrile X 48 hours and with normalization of WBC's while off abx's Will continue lasix and encourage her to follow low sodium   2-PNA Repeated Chest X-ray 11-26; persistent right base collapse, and small effusion.  Received Vancomycin for 5 days /cefepime for 6 days and Levaquin X 8 days Encourage use of flutter valve Will hold on any further antibiotics currently  3-Fever:  -patient afebrile for 48 hours prior to discharge -Follow urine culture, blood culture. So far no growth -antibiotics discontinued -C. Diff negative -WBC's WNL  4-Ileus, cecum dilation:  -Continue with as needed daily miralax  -advise to maintain good hydration -C. Diff negative -at discharge WBC's WNL  5-COPD: -Continue with Spiriva, and Symbicort.  -no wheezing -patient advise to quit smoking  6-Acute on Chronic systolic, diastolic HF exacerbation.  -resume Cozaar, daily weights and low sodium diet -continue lasix 30mg  daily -continue oxygen supplementation   7-Chest Pain, Back pain:  Worsening T 6 Compression fracture:  -Patient reported back pain and chest pain are much better. She had Ct angio 11-23 which was negative for PE.  -Troponin times 3 negative. Patient was evaluated by cardiology 11-23 who thought chest pain was neuropathic in nature and recommended no further work up.  -Per Dr Ronnald Ramp (neurosurgery) no need for kyphoplasty.  -Palliative consulted for pain management; Appreciate Dr Deitra Mayo consultation and rec's. Gabapentin dose adjusted and at discharge taking 200mg  TID with good response. Will use PRN tramadol at discharge to assist  with pain.Marland Kitchen   8-Yeast: will treat with 3 days of diflucan  Procedures:  See below for x-ray reports   Consultations:  CCM.   Dr Benson Norway (GI)  Dr. Ronnald Ramp (Neurosurgery)  Discharge Exam: Filed Vitals:   05/22/14 1554  BP: 139/61  Pulse: 81   Temp: 98.7 F (37.1 C)  Resp: 20    General: Alert, awake. In o distress. Breathing a lot better and has remained afebrile.  Cardiovascular: S1, S 2 RRR, systolic murmur.   Respiratory: improve air movement, scattered rhonchi, no wheezing, no crackles   Abdomen: BS decrease, NT, no distended. No guarding. Soft.  Musculoskeletal: no edema.  Discharge Instructions You were cared for by a hospitalist during your hospital stay. If you have any questions about your discharge medications or the care you received while you were in the hospital after you are discharged, you can call the unit and asked to speak with the hospitalist on call if the hospitalist that took care of you is not available. Once you are discharged, your primary care physician will handle any further medical issues. Please note that NO REFILLS for any discharge medications will be authorized once you are discharged, as it is imperative that you return to your primary care physician (or establish a relationship with a primary care physician if you do not have one) for your aftercare needs so that they can reassess your need for medications and monitor your lab values.  Discharge Instructions    Diet - low sodium heart healthy    Complete by:  As directed      Discharge instructions    Complete by:  As directed   Take medications as prescribed Stop smoking Maintain good hydration Check your weight on daily basis Please follow a low sodium diet (less than 2.5 grams daily) Wear oxygen 24/7 as instructed          Current Discharge Medication List    START taking these medications   Details  calcitonin, salmon, (MIACALCIN/FORTICAL) 200 UNIT/ACT nasal spray Place 1 spray into alternate nostrils daily. Qty: 3.7 mL, Refills: 3    fluconazole (DIFLUCAN) 100 MG tablet Take 1 tablet (100 mg total) by mouth daily. Qty: 3 tablet, Refills: 0    gabapentin (NEURONTIN) 100 MG capsule Take 2 capsules (200 mg total) by  mouth 3 (three) times daily. Qty: 180 capsule, Refills: 0    nicotine (NICODERM CQ - DOSED IN MG/24 HOURS) 14 mg/24hr patch Place 1 patch (14 mg total) onto the skin daily. Qty: 28 patch, Refills: 0    polyethylene glycol (MIRALAX / GLYCOLAX) packet Take 17 g by mouth daily as needed for mild constipation. Qty: 14 each, Refills: 0    saccharomyces boulardii (FLORASTOR) 250 MG capsule Take 1 capsule (250 mg total) by mouth 2 (two) times daily. Qty: 60 capsule, Refills: 0    TraMADol HCl 50 MG TBDP Take 1 tablet by mouth every 6 (six) hours as needed. Qty: 45 tablet, Refills: 0      CONTINUE these medications which have CHANGED   Details  furosemide (LASIX) 20 MG tablet Take 1.5 tablets (30 mg total) by mouth daily. Qty: 45 tablet, Refills: 1      CONTINUE these medications which have NOT CHANGED   Details  albuterol (PROVENTIL HFA;VENTOLIN HFA) 108 (90 BASE) MCG/ACT inhaler Inhale 2 puffs into the lungs every 4 (four) hours as needed for wheezing or shortness of breath. Qty: 1 Inhaler, Refills: 11    aspirin 81 MG  tablet Take 81 mg by mouth every morning.     budesonide-formoterol (SYMBICORT) 80-4.5 MCG/ACT inhaler Inhale 2 puffs into the lungs 2 (two) times daily. Qty: 1 Inhaler, Refills: 12    clonazePAM (KLONOPIN) 0.5 MG tablet TAKE TWO TABLET BY MOUTH EVERY AT BEDTIME Qty: 60 tablet, Refills: 5    diltiazem (CARDIZEM CD) 120 MG 24 hr capsule Take 1 capsule (120 mg total) by mouth daily. Qty: 30 capsule, Refills: 12    guaiFENesin (MUCINEX) 600 MG 12 hr tablet Take 600 mg by mouth 2 (two) times daily.    losartan (COZAAR) 50 MG tablet Take 1 tablet (50 mg total) by mouth daily. Qty: 90 tablet, Refills: 3    ondansetron (ZOFRAN) 4 MG tablet Take 4 mg by mouth every 4 (four) hours as needed for nausea or vomiting.    Potassium Chloride ER 20 MEQ TBCR Take 40 mEq by mouth 2 (two) times daily. Qty: 120 tablet, Refills: 2    tiotropium (SPIRIVA) 18 MCG inhalation capsule  Place 1 capsule (18 mcg total) into inhaler and inhale daily. Qty: 30 capsule, Refills: 12    vitamin E 100 UNIT capsule Take 100 Units by mouth every morning.       STOP taking these medications     methylPREDNISolone (MEDROL) 2 MG tablet        Allergies  Allergen Reactions  . Tetanus Toxoid Anaphylaxis  . Codeine Nausea And Vomiting and Other (See Comments)    Itching and faint   . Erythromycin Nausea And Vomiting  . Meloxicam Itching  . Methylprednisolone Hives  . Penicillins Hives  . Prednisone Other (See Comments)    GI bleed   Follow-up Information    Follow up with FRY,STEPHEN A, MD. Schedule an appointment as soon as possible for a visit in 1 week.   Specialty:  Family Medicine   Contact information:   [place] [address] [phone]        The results of significant diagnostics from this hospitalization (including imaging, microbiology, ancillary and laboratory) are listed below for reference.    Significant Diagnostic Studies: Dg Chest 2 View  05/05/2014   CLINICAL DATA:  Short of breath. COPD. History of smoking and hypertension.  EXAM: CHEST  2 VIEW  COMPARISON:  12/10/2013.  FINDINGS: Cardiac silhouette is normal in size. Aorta is mildly uncoiled. No mediastinal or hilar masses or evidence of adenopathy.  Lungs are hyperexpanded. Reticular opacity in the left lateral lung base is stable consistent with scarring. No lung consolidation or edema. No pleural effusion or pneumothorax. No discrete lung mass or nodule.  Bony thorax is demineralized but grossly intact.  IMPRESSION: 1. No acute cardiopulmonary disease. 2. Findings consistent with COPD.  No change from the prior exam.   Electronically Signed   By: Lajean Manes M.D.   On: 05/05/2014 19:40   Dg Abd 1 View  05/17/2014   CLINICAL DATA:  Ileus.  EXAM: ABDOMEN - 1 VIEW  COMPARISON:  05/16/2014 and 05/15/2014  FINDINGS: There is decreased distention of the bowel loop in the left  mid abdomen. After review of multiple prior studies, I have determined that this represents the cecum. The cecum is in the upper midline on the prior CT scan of of 07/06/2011.  I do not think this represents a cecal volvulus.  The other bowel appears normal. No visible free air or free fluid on the supine radiograph.  IMPRESSION: Resolution of distention of the cecum in the  left mid abdomen. Maximum diameter is approximately 7.5 cm at this time.   Electronically Signed   By: Rozetta Nunnery M.D.   On: 05/17/2014 08:16   Dg Abd 1 View  05/14/2014   CLINICAL DATA:  Generalized abdominal pain, nausea and vomiting  EXAM: ABDOMEN - 1 VIEW  COMPARISON:  05/12/2014 and 6/6/ 15  FINDINGS: There is gaseous distended viscus in mid and left abdomen measures at least 9.6 cm in diameter. This is highly suspicious for segmental gaseous distension of probable transverse colon. Colonic ileus or colonic obstruction cannot be excluded. Some gas noted within stomach.  IMPRESSION: Gaseous distended viscus in mid and left abdomen measuring at least 9.6 cm in diameter highly suspicious for colonic segmental distension. Highly suspicious for segmental ileus or colonic obstruction. Clinical correlation is necessary.   Electronically Signed   By: Lahoma Crocker M.D.   On: 05/14/2014 10:10   Ct Angio Chest Pe W/cm &/or Wo Cm  05/12/2014   CLINICAL DATA:  Back pain between the shoulder blades. Evaluate for dissection. History of COPD.  EXAM: CT ANGIOGRAPHY CHEST AND ABDOMEN  TECHNIQUE: Multidetector CT imaging of the chest and abdomen was performed using the standard protocol during bolus administration of intravenous contrast. Multiplanar CT image reconstructions and MIPs were obtained to evaluate the vascular anatomy.  CONTRAST:  138mL OMNIPAQUE IOHEXOL 350 MG/ML SOLN  COMPARISON:  11/22/2013.  07/06/2011.  FINDINGS: CTA CHEST FINDINGS  There is no evidence of intramural hematoma. Precontrast images demonstrate extensive atherosclerotic  calcifications in the vasculature and 3 vessel coronary artery calcifications. There is profound mitral annular calcifications. Moderate degree of aortic valvular calcification.  There is no evidence of aortic dissection. Atherosclerotic plaque is noted. There is extensive calcification in the proximal left subclavian artery  Blooming artifact makes quantification of narrowing difficult.  There are no obvious filling defects in the pulmonary arterial tree to suggest acute pulmonary thromboembolism.  No abnormal mediastinal or hilar adenopathy.  No pneumothorax or pleural effusion  Moderate centrilobular emphysema. Linear atelectasis towards the lung bases.  Worsening T6 compression fracture with 30 % loss of height anteriorly. Slight retropulsion of the inferior endplate.  Review of the MIP images confirms the above findings.  CTA ABDOMEN FINDINGS  The aorta is non aneurysmal and patent. Diffuse moderate atherosclerotic calcifications of the aorta. There is some irregular calcified plaque in the infrarenal aorta till left and just below the left renal artery.  Celiac origin is calcified without obvious significant narrowing. Branch vessels are patent.  SMA is grossly patent with calcifications at its origin and branch vessels are grossly patent.  IMA origin is calcified. It is grossly patent. Branch vessels are grossly patent.  Single right renal artery and 3 left renal arteries are patent. Retro aortic left renal vein anatomy is present.  Right common iliac artery is patent. There is calcified plaque at the origin of the left common iliac artery making quantification of narrowing difficult.  Focal enhancement at the dome of the liver on image 74 is stable. Wedge-shaped enhancement at the inferior tip of the right lobe is also not significantly changed. The former probably represents a benign entity such is M angioma and lateral likely represents phase of contrast or a vascular phenomenon.  Spleen, pancreas,  gallbladder, adrenal glands are within normal limits. Kidneys are within normal limits.  Extensive diverticulosis throughout the colon is noted. Advanced degenerative changes throughout the lumbar spine. Lumbosacral junction is transitional.  Review of the MIP images confirms the  above findings.  IMPRESSION: No evidence of intramural hematoma or aortic dissection. No acute vascular pathology is present on this study.  Worsening T6 compression deformity since the prior study dated November 22, 2013. MRI can be performed to further characterize.   Electronically Signed   By: Maryclare Bean M.D.   On: 05/12/2014 15:44   Ct Angio Abdomen W/cm &/or Wo Contrast  05/12/2014   CLINICAL DATA:  Back pain between the shoulder blades. Evaluate for dissection. History of COPD.  EXAM: CT ANGIOGRAPHY CHEST AND ABDOMEN  TECHNIQUE: Multidetector CT imaging of the chest and abdomen was performed using the standard protocol during bolus administration of intravenous contrast. Multiplanar CT image reconstructions and MIPs were obtained to evaluate the vascular anatomy.  CONTRAST:  161mL OMNIPAQUE IOHEXOL 350 MG/ML SOLN  COMPARISON:  11/22/2013.  07/06/2011.  FINDINGS: CTA CHEST FINDINGS  There is no evidence of intramural hematoma. Precontrast images demonstrate extensive atherosclerotic calcifications in the vasculature and 3 vessel coronary artery calcifications. There is profound mitral annular calcifications. Moderate degree of aortic valvular calcification.  There is no evidence of aortic dissection. Atherosclerotic plaque is noted. There is extensive calcification in the proximal left subclavian artery  Blooming artifact makes quantification of narrowing difficult.  There are no obvious filling defects in the pulmonary arterial tree to suggest acute pulmonary thromboembolism.  No abnormal mediastinal or hilar adenopathy.  No pneumothorax or pleural effusion  Moderate centrilobular emphysema. Linear atelectasis towards the lung bases.   Worsening T6 compression fracture with 30 % loss of height anteriorly. Slight retropulsion of the inferior endplate.  Review of the MIP images confirms the above findings.  CTA ABDOMEN FINDINGS  The aorta is non aneurysmal and patent. Diffuse moderate atherosclerotic calcifications of the aorta. There is some irregular calcified plaque in the infrarenal aorta till left and just below the left renal artery.  Celiac origin is calcified without obvious significant narrowing. Branch vessels are patent.  SMA is grossly patent with calcifications at its origin and branch vessels are grossly patent.  IMA origin is calcified. It is grossly patent. Branch vessels are grossly patent.  Single right renal artery and 3 left renal arteries are patent. Retro aortic left renal vein anatomy is present.  Right common iliac artery is patent. There is calcified plaque at the origin of the left common iliac artery making quantification of narrowing difficult.  Focal enhancement at the dome of the liver on image 74 is stable. Wedge-shaped enhancement at the inferior tip of the right lobe is also not significantly changed. The former probably represents a benign entity such is M angioma and lateral likely represents phase of contrast or a vascular phenomenon.  Spleen, pancreas, gallbladder, adrenal glands are within normal limits. Kidneys are within normal limits.  Extensive diverticulosis throughout the colon is noted. Advanced degenerative changes throughout the lumbar spine. Lumbosacral junction is transitional.  Review of the MIP images confirms the above findings.  IMPRESSION: No evidence of intramural hematoma or aortic dissection. No acute vascular pathology is present on this study.  Worsening T6 compression deformity since the prior study dated November 22, 2013. MRI can be performed to further characterize.   Electronically Signed   By: Maryclare Bean M.D.   On: 05/12/2014 15:44   Dg Chest Port 1 View  05/20/2014   CLINICAL DATA:   Acute onset of shortness of breath. Initial encounter.  EXAM: PORTABLE CHEST - 1 VIEW  COMPARISON:  Chest radiograph performed 05/15/2014  FINDINGS: There is a persistent  small right pleural effusion, with mildly improved right basilar airspace opacification. This may reflect mildly asymmetric interstitial edema or possibly pneumonia. Underlying vascular congestion is again seen. No pneumothorax is identified.  The cardiomediastinal silhouette is borderline normal in size. No acute osseous abnormalities are identified.  IMPRESSION: Persistent small right pleural effusion, with mildly improved right basilar airspace opacification. This may reflect mildly asymmetric interstitial edema or possibly pneumonia. Underlying vascular congestion again seen.   Electronically Signed   By: Garald Balding M.D.   On: 05/20/2014 06:26   Dg Chest Port 1 View  05/15/2014   CLINICAL DATA:  Hypoxemia, smoker, hypertension  EXAM: PORTABLE CHEST - 1 VIEW  COMPARISON:  05/14/2014  FINDINGS: NG tube within the stomach, tip not visualized. Stable cardiomegaly with central vascular congestion. Persistent right base collapse/ consolidation obscuring the hemidiaphragm with likely an associated effusion. Right base pneumonia not excluded. Stable left lung aeration. No pneumothorax. Atherosclerosis of the aorta.  IMPRESSION: Persistent right base collapse/consolidation and likely small effusion. Pneumonia not excluded.   Electronically Signed   By: Daryll Brod M.D.   On: 05/15/2014 10:55   Dg Chest Port 1 View  05/14/2014   CLINICAL DATA:  Hypoxia  EXAM: PORTABLE CHEST - 1 VIEW  COMPARISON:  05/12/2014  FINDINGS: Stable cardiomegaly and aortic tortuosity.  There is interstitial coarsening which is increased from prior, with subtle [employer]. There is a new airspace opacity at the right base. No effusion or pneumothorax.  IMPRESSION: 1. New right basilar opacity suspicious for pneumonia or aspiration pneumonitis. 2. Developing  pulmonary edema.   Electronically Signed   By: Jorje Guild M.D.   On: 05/14/2014 05:38   Dg Chest Port 1 View  05/12/2014   CLINICAL DATA:  Chest and upper back pain with shortness of Breath  EXAM: PORTABLE CHEST - 1 VIEW  COMPARISON:  05/05/2014  FINDINGS: Cardiac shadow is stable at the upper limits of normal. The lungs are mildly hyperinflated consistent with COPD. Mild stable interstitial changes are seen. No focal infiltrate or sizable effusion is noted. No bony abnormality is noted. Diffuse aortic calcifications are again seen.  IMPRESSION: COPD without acute abnormality.   Electronically Signed   By: Inez Catalina M.D.   On: 05/12/2014 14:06   Dg Abd Portable 1v  05/16/2014   CLINICAL DATA:  Ileus  EXAM: PORTABLE ABDOMEN - 1 VIEW  COMPARISON:  05/15/2014  FINDINGS: Nasogastric tube tip in the region of the pylorus.  Decreasing distention of small bowel in the left abdomen, 6-7 cm currently.  Chronic elevation the right diaphragm with superimposed pleural effusion.  The remainder of the abdomen is stable.  IMPRESSION: 1. Focal dilation of small bowel in the left abdomen is persistent, but gradually decreasing. 2. Nasogastric tube tip at the pylorus.   Electronically Signed   By: Jorje Guild M.D.   On: 05/16/2014 05:44   Dg Abd Portable 1v  05/15/2014   CLINICAL DATA:  Abdominal pain.  Question ileus.  EXAM: PORTABLE ABDOMEN - 1 VIEW  COMPARISON:  CT chest and abdomen 05/12/2014. Single view of the abdomen 11/23/2013 and 05/14/2014.  FINDINGS: Gaseous distention of a loop of bowel in the left upper quadrant is again seen. The loop measures 7.8 cm today compared to 9.6 cm yesterday. Basilar view of comparison CT, this is a loop of small bowel. The examination is otherwise unremarkable.  IMPRESSION: Mild decrease in gaseous distention of a loop of small bowel in the left upper quadrant of  the abdomen.   Electronically Signed   By: Inge Rise M.D.   On: 05/15/2014 10:16   US Abdomen  Limited Ruq  05/16/2014   CLINICAL DATA:  Chest and abdominal pain, hypertension  EXAM: US ABDOMEN LIMITED - RIGHT UPPER QUADRANT  COMPARISON:  CT 05/12/2014  FINDINGS: Gallbladder:  No gallstones or wall thickening visualized. No sonographic Murphy sign noted.  Common bile duct:  Diameter: 2.6 mm, unremarkable  Liver:  6 x 5 mm cyst in segment 7 as seen previously. No other focal lesion identified. Within normal limits in parenchymal echogenicity.  Right pleural effusion incidentally noted.  IMPRESSION: 1. Negative gallbladder. 2. Right pleural effusion.   Electronically Signed   By: Arne Cleveland M.D.   On: 05/16/2014 09:42    Microbiology: Recent Results (from the past 240 hour(s))  MRSA PCR Screening     Status: None   Collection Time: 05/14/14  4:54 AM  Result Value Ref Range Status   MRSA by PCR NEGATIVE NEGATIVE Final    Comment:        The GeneXpert MRSA Assay (FDA approved for NASAL specimens only), is one component of a comprehensive MRSA colonization surveillance program. It is not intended to diagnose MRSA infection nor to guide or monitor treatment for MRSA infections.   Culture, blood (routine x 2)     Status: None   Collection Time: 05/14/14  6:45 AM  Result Value Ref Range Status   Specimen Description BLOOD RIGHT ANTECUBITAL  Final   Special Requests BOTTLES DRAWN AEROBIC AND ANAEROBIC 5CC  Final   Culture  Setup Time   Final    05/14/2014 09:02 Performed at [facility]   Final    NO GROWTH 5 DAYS Performed at [facility]    Report Status 05/20/2014 FINAL  Final  Culture, blood (routine x 2)     Status: None   Collection Time: 05/14/14  6:50 AM  Result Value Ref Range Status   Specimen Description BLOOD RIGHT HAND  Final   Special Requests BOTTLES DRAWN AEROBIC AND ANAEROBIC 5CC  Final   Culture  Setup Time   Final    05/14/2014 09:02 Performed at [facility]    Culture   Final    NO GROWTH 5 DAYS Performed at  [facility]    Report Status 05/20/2014 FINAL  Final  Culture, blood (routine x 2)     Status: None (Preliminary result)   Collection Time: 05/20/14  5:30 AM  Result Value Ref Range Status   Specimen Description BLOOD LEFT ARM  Final   Special Requests BOTTLES DRAWN AEROBIC AND ANAEROBIC 10CC  Final   Culture  Setup Time   Final    05/20/2014 08:50 Performed at [facility]    Culture   Final           BLOOD CULTURE RECEIVED NO GROWTH TO DATE CULTURE WILL BE HELD FOR 5 DAYS BEFORE ISSUING A FINAL NEGATIVE REPORT Performed at [facility]    Report Status PENDING  Incomplete  Culture, blood (routine x 2)     Status: None (Preliminary result)   Collection Time: 05/20/14  5:35 AM  Result Value Ref Range Status   Specimen Description BLOOD LEFT HAND  Final   Special Requests BOTTLES DRAWN AEROBIC ONLY [facility]  Final   Culture  Setup Time   Final    05/20/2014 08:39 Performed at [facility]  Culture   Final           BLOOD CULTURE RECEIVED NO GROWTH TO DATE CULTURE WILL BE HELD FOR 5 DAYS BEFORE ISSUING A FINAL NEGATIVE REPORT Performed at [facility]    Report Status PENDING  Incomplete  Urine culture     Status: None   Collection Time: 05/21/14  4:10 AM  Result Value Ref Range Status   Specimen Description URINE, CLEAN CATCH  Final   Special Requests NONE  Final   Culture  Setup Time   Final    05/21/2014 08:37 Performed at [place] Performed at [facility]   Final   Culture NO GROWTH Performed at [facility]   Final   Report Status 05/22/2014 FINAL  Final  Clostridium Difficile by PCR     Status: None   Collection Time: 05/21/14  1:57 PM  Result Value Ref Range Status   C difficile by pcr NEGATIVE NEGATIVE Final    Comment: Performed at [place]: Basic Metabolic Panel:  Recent Labs Lab 05/16/14 0350 05/17/14 0344 05/18/14 0408 05/19/14 0433  05/20/14 0420 05/21/14 0409 05/22/14 0510  NA 133* 132* 130* 131* 130* 131* 132*  K 4.6 4.1 4.1 3.8 4.0 4.0 3.7  CL 91* 90* 91* 86* 85* 87* 87*  CO2 31 26 31  32 33* 35* 32  GLUCOSE 70 69* 104* 92 102* 107* 101*  BUN 11 12 6 6  5* 6 6  CREATININE 0.44* 0.41* 0.40* 0.48* 0.43* 0.52 0.43*  CALCIUM 8.7 9.2 8.8 9.5 9.3 9.1 9.2  MG 2.0 1.6 1.5  --   --   --   --   PHOS 2.6 2.5  --   --   --   --   --    Liver Function Tests:  Recent Labs Lab 05/16/14 0350  AST 14  ALT <5  ALKPHOS 62  BILITOT 0.3  PROT 6.1  ALBUMIN 2.6*   CBC:  Recent Labs Lab 05/16/14 0350 05/17/14 0344 05/18/14 0408 05/19/14 0433 05/20/14 0420 05/20/14 0535 05/21/14 0409 05/22/14 0510  WBC 8.7 9.2 8.3 10.6* 11.3* 13.6* 14.8* 10.4  NEUTROABS 6.5 6.8 5.7  --   --  10.2*  --   --   HGB 11.6* 12.0 11.7* 12.8 12.5 12.8 11.7* 11.2*  HCT 35.7* 36.4 34.8* 39.3 37.8 38.3 35.7* 34.4*  MCV 99.4 97.1 96.4 97.5 95.7 96.2 96.7 95.0  PLT 296 352 330 382 415* 410* 423* 465*   Cardiac Enzymes:  Recent Labs Lab 05/17/14 0344  TROPONINI <0.30   BNP: BNP (last 3 results)  Recent Labs  03/26/14 1005 05/12/14 1316 05/14/14 0533  PROBNP 94.0 577.4* 1351.0*   CBG:  Recent Labs Lab 05/17/14 0812  GLUCAP 85    Signed:  Barton Dubois  [facility] 05/22/2014, 3:59 PM

## 2014-05-22 NOTE — Progress Notes (Signed)
Physical Therapy Discharge Patient Details Name: Jill White MRN: [id number] DOB: [birth year] Today's Date: 05/22/2014 Time:  -     Patient discharged from PT services secondary to patient declines any further PT.  Please see latest therapy progress note for current level of functioning and progress toward goals.    Progress and discharge plan discussed with patient and/or caregiver: patient elected to DC PT  GP     Marcelino Freestone PT [phone]   05/22/2014, 11:32 AM

## 2014-05-22 NOTE — Plan of Care (Signed)
Problem: Phase I Progression Outcomes Goal: Pain controlled with appropriate interventions Outcome: Completed/Met Date Met:  05/22/14  Problem: Phase II Progression Outcomes Goal: Progress activity as tolerated unless otherwise ordered Outcome: Completed/Met Date Met:  05/22/14 Goal: Discharge plan established Outcome: Completed/Met Date Met:  05/22/14  Problem: Phase III Progression Outcomes Goal: Pain controlled on oral analgesia Outcome: Completed/Met Date Met:  05/22/14 Goal: Activity at appropriate level-compared to baseline (UP IN CHAIR FOR HEMODIALYSIS)  Outcome: Completed/Met Date Met:  05/22/14 Goal: Voiding independently Outcome: Completed/Met Date Met:  05/22/14 Goal: IV/normal saline lock discontinued Outcome: Completed/Met Date Met:  05/22/14 Goal: Foley discontinued Outcome: Not Applicable Date Met:  22/17/98 Goal: Discharge plan remains appropriate-arrangements made Outcome: Completed/Met Date Met:  05/22/14 Goal: Other Phase III Outcomes/Goals Outcome: Completed/Met Date Met:  05/22/14

## 2014-05-26 LAB — CULTURE, BLOOD (ROUTINE X 2)
Culture: NO GROWTH
Culture: NO GROWTH

## 2014-05-29 ENCOUNTER — Encounter (HOSPITAL_COMMUNITY): Payer: Self-pay | Admitting: Cardiovascular Disease

## 2014-05-29 ENCOUNTER — Telehealth: Payer: Self-pay | Admitting: Family Medicine

## 2014-05-29 NOTE — Telephone Encounter (Signed)
granddaughter states pt is not doing well. Pt is having stomach issues. Pt states when she eats she has gas and belches and it hurts.  Pt did not have any stomach issues when she went into the hospital. Granddaughter would really like pt to be seen today Friday if possible. Or they are looking for a referral to a gi dr per the hospital if they need to wait until Monday for appt. pls advise

## 2014-05-29 NOTE — Telephone Encounter (Addendum)
Error/accidently closed

## 2014-05-30 ENCOUNTER — Other Ambulatory Visit: Payer: Self-pay | Admitting: Family Medicine

## 2014-05-30 ENCOUNTER — Telehealth: Payer: Self-pay | Admitting: Family Medicine

## 2014-05-30 DIAGNOSIS — R1084 Generalized abdominal pain: Secondary | ICD-10-CM

## 2014-05-30 DIAGNOSIS — R109 Unspecified abdominal pain: Secondary | ICD-10-CM

## 2014-05-30 NOTE — Telephone Encounter (Signed)
Spoke to the pt.  She states she is having painful gas when she eats.  Denies any fever, nausea, vomiting, diarrhea or chest pain.  Per Dr. Sarajane Jews pt can use OTC Gas X and have placed a GI referral

## 2014-05-30 NOTE — Telephone Encounter (Signed)
done

## 2014-05-30 NOTE — Telephone Encounter (Signed)
Granddaughter caled to ask if we could get this referral STAT. GI cannot see her until next wed. She is unable to eat w/out having a lot of ags. pls advise.

## 2014-05-30 NOTE — Telephone Encounter (Signed)
granddaughter called back and found a GI doctor that will see her today. However, pt needs a referral.  Dr Darlis Loan [place], [place] [address]  [phone] appt is at 12:45.Today  They need a copy of hospital records as well, asap.

## 2014-05-30 NOTE — Telephone Encounter (Signed)
Patient is a patient of Dr. Amedeo Plenty with [facility].  She should return to his office

## 2014-06-02 ENCOUNTER — Telehealth: Payer: Self-pay | Admitting: Family Medicine

## 2014-06-02 ENCOUNTER — Ambulatory Visit: Payer: Medicare Other | Admitting: Family Medicine

## 2014-06-02 NOTE — Telephone Encounter (Signed)
appt cancelled, pt will not be charged no show fee.

## 2014-06-02 NOTE — Telephone Encounter (Signed)
Pt was on schedule to see Dr. Sarajane Jews today at 11 AM. I called and left a voice message for pt advising her to reschedule appointment.

## 2014-06-02 NOTE — Telephone Encounter (Signed)
Pt called back and stated that she was under the impression that this office visit had been cancelled, since she was going for a GI consult. Per Dr. Sarajane Jews, there should not be a fee for this.

## 2014-06-09 ENCOUNTER — Encounter: Payer: Self-pay | Admitting: Family Medicine

## 2014-06-23 ENCOUNTER — Ambulatory Visit: Payer: Medicare Other | Admitting: Adult Health

## 2014-06-25 ENCOUNTER — Telehealth: Payer: Self-pay | Admitting: Family Medicine

## 2014-06-25 MED ORDER — GABAPENTIN 100 MG PO CAPS: 200.0000 mg | ORAL_CAPSULE | Freq: Three times a day (TID) | ORAL | Status: DC

## 2014-06-25 NOTE — Telephone Encounter (Signed)
I sent script e-scribe and spoke with pt's relative.

## 2014-06-25 NOTE — Telephone Encounter (Signed)
Patient's daughter is requesting a re-fill on gabapentin (NEURONTIN) 100 MG capsule.  She states Dr. Lenward Chancellor spoke to Dr. Sarajane Jews about filling the rx.  Patient has been out of medication for 2 days.  Please call to advise of re-fill. [id number] - [place], [place] - [address]

## 2014-06-26 ENCOUNTER — Telehealth: Payer: Self-pay | Admitting: Internal Medicine

## 2014-06-26 NOTE — Telephone Encounter (Signed)
Dr Lucillie Garfinkel at [facility] called. He is planning vaginal fistula 2h surgery. Explained if no active AECOPD she should be low-moderate risk for resp complications.  Dr. Brand Males, M.D., [facility]C.P Pulmonary and Critical Care Medicine Staff Physician [place] [facility] Pager: [phone], If no answer or between  15:00h - 7:00h: call [phone]  06/26/2014 4:01 PM

## 2014-07-01 ENCOUNTER — Encounter: Payer: Self-pay | Admitting: Adult Health

## 2014-07-01 ENCOUNTER — Ambulatory Visit (INDEPENDENT_AMBULATORY_CARE_PROVIDER_SITE_OTHER)
Admission: RE | Admit: 2014-07-01 | Discharge: 2014-07-01 | Disposition: A | Discharge status: Home / Self Care | Payer: Medicare Other | Source: Ambulatory Visit | Attending: Adult Health | Admitting: Adult Health

## 2014-07-01 ENCOUNTER — Ambulatory Visit (INDEPENDENT_AMBULATORY_CARE_PROVIDER_SITE_OTHER): Payer: Medicare Other | Admitting: Adult Health

## 2014-07-01 VITALS — BP 106/56 | HR 105 | Temp 98.3°F | Ht 61.5 in | Wt 115.6 lb

## 2014-07-01 DIAGNOSIS — J189 Pneumonia, unspecified organism: Secondary | ICD-10-CM | POA: Diagnosis not present

## 2014-07-01 DIAGNOSIS — J438 Other emphysema: Secondary | ICD-10-CM

## 2014-07-01 NOTE — Progress Notes (Signed)
   Subjective:    Patient ID: Jill White, female    DOB: 1941-04-18, 74 y.o.   MRN: [id number]  HPI 74 yo Female smoker  with COPD on O2 with  Act and At bedtime     07/01/2014 Follow up COPD  Pt is having having vaginal fistula repair on  1/14 and surgeon requested cxr to make sure she is stable .  Does have on/off  prod cough with clear mucus.  Denies any tightness, f/c/s, n/v/d, hemoptysis, weakness , orthopnea or edema.  CXR today with no acute process.  Remains on Symbicort and Spiriva .  Dr. Chase Caller advised she is low to moderate risk for resp complications. Advised her on smoking cessation is very important.    Review of Systems Constitutional:   No  weight loss, night sweats,  Fevers, chills, + fatigue, or  lassitude.  HEENT:   No headaches,  Difficulty swallowing,  Tooth/dental problems, or  Sore throat,                No sneezing, itching, ear ache, nasal congestion, post nasal drip,   CV:  No chest pain,  Orthopnea, PND, swelling in lower extremities, anasarca, dizziness, palpitations, syncope.   GI  No heartburn, indigestion, abdominal pain, nausea, vomiting, diarrhea, change in bowel habits, loss of appetite, bloody stools.   Resp:  No chest wall deformity  Skin: no rash or lesions.  GU: no dysuria, change in color of urine, no urgency or frequency.  No flank pain, no hematuria   MS:  No joint pain or swelling.  No decreased range of motion.  No back pain.  Psych:  No change in mood or affect. No depression or anxiety.  No memory loss.         Objective:   Physical Exam GEN: A/Ox3; pleasant , NAD, thin/elderly    HEENT:  [place]/AT,  EACs-clear, TMs-wnl, NOSE-clear, THROAT-clear, no lesions, no postnasal drip or exudate noted.   NECK:  Supple w/ fair ROM; no JVD; normal carotid impulses w/o bruits; no thyromegaly or nodules palpated; no lymphadenopathy.  RESP  Decreased BS in bases  w/o, wheezes/ rales/ or rhonchi.no accessory muscle use, no dullness to  percussion  CARD:  RRR, no m/r/g  , no peripheral edema, pulses intact, no cyanosis or clubbing.  GI:   Soft & nt; nml bowel sounds; no organomegaly or masses detected.  Musco: Warm bil, no deformities or joint swelling noted.   Neuro: alert, no focal deficits noted.    Skin: Warm, no lesions or rashes         Assessment & Plan:

## 2014-07-01 NOTE — Assessment & Plan Note (Signed)
Stable without flare  cXR w/ no acute process  Pt is active smoker with COPD -advised of risks of smoking Advised no smoking prior to surgery   Plan  Continue on Symbicort and Spiriva Must quit smoking, no smoking at least before surgery.  Use oxygen 2 L with walking and at bedtime. Good luck with surgery  Mucinex DM Twice daily  As needed  Cough/congestion .  Followup with Dr. Chase Caller in 3 months and As needed   Please contact office for sooner follow up if symptoms do not improve or worsen or seek emergency care

## 2014-07-01 NOTE — Patient Instructions (Addendum)
Continue on Symbicort and Spiriva Must quit smoking, no smoking at least before surgery.  Use oxygen 2 L with walking and at bedtime. Good luck with surgery  Mucinex DM Twice daily  As needed  Cough/congestion .  Followup with Dr. Chase Caller in 3 months and As needed   Please contact office for sooner follow up if symptoms do not improve or worsen or seek emergency care

## 2014-07-03 ENCOUNTER — Other Ambulatory Visit: Payer: Self-pay | Admitting: Family Medicine

## 2014-07-03 HISTORY — PX: LAPAROSCOPIC SIGMOID COLECTOMY: SHX5928

## 2014-07-09 ENCOUNTER — Ambulatory Visit (INDEPENDENT_AMBULATORY_CARE_PROVIDER_SITE_OTHER): Payer: Medicare Other | Admitting: Internal Medicine

## 2014-07-09 ENCOUNTER — Encounter: Payer: Self-pay | Admitting: Internal Medicine

## 2014-07-09 VITALS — BP 118/60 | HR 88 | Temp 98.8°F | Ht 61.5 in | Wt 121.0 lb

## 2014-07-09 DIAGNOSIS — J189 Pneumonia, unspecified organism: Secondary | ICD-10-CM

## 2014-07-09 DIAGNOSIS — J9612 Chronic respiratory failure with hypercapnia: Secondary | ICD-10-CM

## 2014-07-09 DIAGNOSIS — J432 Centrilobular emphysema: Secondary | ICD-10-CM

## 2014-07-09 NOTE — Progress Notes (Signed)
Subjective:    Patient ID: Jill White, female    DOB: 11-04-1940, 74 y.o.   MRN: [id number]  HPI 74 yo Female smoker  with COPD on O2 with  Act and At bedtime     07/01/2014 Follow up COPD  Pt is having having vaginal fistula repair on  1/14 and surgeon requested cxr to make sure she is stable .  Does have on/off  prod cough with clear mucus.  Denies any tightness, f/c/s, n/v/d, hemoptysis, weakness , orthopnea or edema.  CXR today with no acute process.  Remains on Symbicort and Spiriva .  Dr. Chase Caller advised she is low to moderate risk for resp complications. Advised her on smoking cessation is very important. rec Continue on Symbicort and Spiriva Must quit smoking, no smoking at least before surgery.  Use oxygen 2 L with walking and at bedtime. 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 Acute Visit    Pt states she was just discharged from [facility] today after having fistula surgery. She had cxr on 07/08/14 and was advised she may have PNA. She was started on Levaquin 500 mg today. Pt c/o prod cough with yellow sputum.   on symbicort 80 2 bid and spiriva  and no need for proair  / poor cough mechanics but no sob at rest, now wearing 02 24/7  No obvious day to day or daytime variabilty or assoc cp or chest tightness, subjective wheeze overt sinus or hb symptoms. No unusual exp hx or h/o childhood pna/ asthma or knowledge of premature birth.  Sleeping ok without nocturnal  or early am exacerbation  of respiratory  c/o's or need for noct saba. Also denies any obvious fluctuation of symptoms with weather or environmental changes or other aggravating or alleviating factors except as outlined above   Current Medications, Allergies, Complete Past Medical History, Past Surgical History,  Family History, and Social History were reviewed in [employer] record.  ROS  The following are not active complaints unless bolded sore throat, dysphagia, dental problems, itching, sneezing,  nasal congestion or excess/ purulent secretions, ear ache,   fever, chills, sweats, unintended wt loss, pleuritic or exertional cp, hemoptysis,  orthopnea pnd or leg swelling, presyncope, palpitations, heartburn, abdominal pain, anorexia, nausea, vomiting, diarrhea  or change in bowel or urinary habits, change in stools or urine, dysuria,hematuria,  rash, arthralgias, visual complaints, headache, numbness weakness or ataxia or problems with walking or coordination,  change in mood/affect or memory.           Objective:   Physical Exam GEN: A/Ox3; pleasant , NAD, thin/elderly  nad but very congested coughing   Wt Readings from Last 3 Encounters:  07/09/14 121 lb (54.885 kg)  07/01/14 115 lb 9.6 oz (52.436 kg)  05/22/14 126 lb 12.8 oz (57.516 kg)    Vital signs reviewed   HEENT:  [place]/AT,  EACs-clear, TMs-wnl, NOSE-clear, THROAT-clear, no lesions, no postnasal drip or exudate noted.   NECK:  Supple w/ fair ROM; no JVD; normal carotid impulses w/o bruits; no thyromegaly or nodules palpated; no lymphadenopathy.  RESP  Decreased BS in bases  Mild insp/exp rhonchi bilaterally no accessory muscle use, no dullness to percussion  CARD:  RRR,   II/III - VI sem   ,  no peripheral edema, pulses intact, no cyanosis or clubbing.  GI:   Soft & nt; nml bowel sounds; no organomegaly or masses detected.  Musco: Warm bil, no deformities or joint swelling noted.   Neuro: alert, no focal deficits noted.    Skin: Warm, no lesions or rashes    cxr 07/08/14  Report Increasing bibasilar infiltrate R > L with small effusion on R          Assessment & Plan:

## 2014-07-09 NOTE — Patient Instructions (Signed)
For cough mucinex up to 1200 mg every 12 hours plus use the flutter valve as much as possible  Go ahead and finish the levaquin   02 target is 90% ok to adjust sitting walking and supine  See Dr Chase Caller or Tammy in 2 weeks with cxr - call sooner if breathing getting worse or need more than 6 lpm 02

## 2014-07-10 MED ORDER — FLUTTER DEVI: Status: DC

## 2014-07-12 ENCOUNTER — Encounter: Payer: Self-pay | Admitting: Internal Medicine

## 2014-07-12 DIAGNOSIS — J189 Pneumonia, unspecified organism: Secondary | ICD-10-CM | POA: Insufficient documentation

## 2014-07-12 DIAGNOSIS — J9612 Chronic respiratory failure with hypercapnia: Secondary | ICD-10-CM | POA: Insufficient documentation

## 2014-07-12 NOTE — Assessment & Plan Note (Addendum)
hc03 trends low 30's/ well compensated at present on 2lpm >  Ok to titrate to low 90's sitting/ walking/ supine which I cautioned probably will required different settings and don't want to push 02 flow above 6 lpm nor sats higher than low 90s in this setting

## 2014-07-12 NOTE — Assessment & Plan Note (Signed)
Assoc with very poor mucociliary function which is aggravated by poor exp mechanics from abd surgery > add flutter     Each maintenance medication was reviewed in detail including most importantly the difference between maintenance and as needed and under what circumstances the prns are to be used.  Please see instructions for details which were reviewed in writing and the patient given a copy.

## 2014-07-12 NOTE — Assessment & Plan Note (Signed)
Can't rule this out and certainly at risk due to very poor cough mechanics > levaquin approp and should continue

## 2014-07-15 ENCOUNTER — Ambulatory Visit: Payer: Medicare Other | Admitting: Adult Health

## 2014-07-23 ENCOUNTER — Ambulatory Visit (INDEPENDENT_AMBULATORY_CARE_PROVIDER_SITE_OTHER)
Admission: RE | Admit: 2014-07-23 | Discharge: 2014-07-23 | Disposition: A | Discharge status: Home / Self Care | Payer: Medicare Other | Source: Ambulatory Visit | Attending: Internal Medicine | Admitting: Internal Medicine

## 2014-07-23 ENCOUNTER — Encounter: Payer: Self-pay | Admitting: Internal Medicine

## 2014-07-23 ENCOUNTER — Ambulatory Visit (INDEPENDENT_AMBULATORY_CARE_PROVIDER_SITE_OTHER): Payer: Medicare Other | Admitting: Internal Medicine

## 2014-07-23 ENCOUNTER — Other Ambulatory Visit: Payer: Self-pay | Admitting: Family Medicine

## 2014-07-23 VITALS — BP 110/56 | HR 112 | Ht 61.0 in | Wt 114.0 lb

## 2014-07-23 DIAGNOSIS — Z720 Tobacco use: Secondary | ICD-10-CM

## 2014-07-23 DIAGNOSIS — R64 Cachexia: Secondary | ICD-10-CM

## 2014-07-23 DIAGNOSIS — F17200 Nicotine dependence, unspecified, uncomplicated: Secondary | ICD-10-CM

## 2014-07-23 DIAGNOSIS — J189 Pneumonia, unspecified organism: Secondary | ICD-10-CM

## 2014-07-23 DIAGNOSIS — J449 Chronic obstructive pulmonary disease, unspecified: Secondary | ICD-10-CM

## 2014-07-23 NOTE — Patient Instructions (Addendum)
ICD-9-CM ICD-10-CM   1. Chronic obstructive pulmonary disease, unspecified COPD, unspecified chronic bronchitis type 496 J44.9   2. Cachexia 799.4 R64   3. Pneumonia, organism unspecified 486 J18.9 DG Chest 2 View     - No pneumonia on chest xray  - STart whey protein powder 1-2 scoops per day (optimum nutrition) - Copd is stable   - albuterol as needed, dailysymbicort and spiriva to continue  - will monitor hoarseness as discussed; might need ENT referral Please quit smoking    Followup  3 months or sooner if needed; followup discuss IMPACT COPD research trial

## 2014-07-23 NOTE — Progress Notes (Signed)
Subjective:    Patient ID: Jill White, female    DOB: 05-05-1941, 74 y.o.   MRN: [id number]  HPI  OV 07/23/2014  Chief Complaint  Patient presents with  . Follow-up    Pt saw MW for an acute visit for HCAP. Pt states her breathing has improved since last OV. Pt c/o prod cough with clear mucus. Pt denies change of SOB from baseline and CP/tightness.    74 year old female with moderate COPD but with recurrence COPD exacerbations. 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 Looks like might of had possible pneumonia postop and was treated here in the outpatient setting. Currently she endorses that her COPD status is stable. She only has baseline moderate dyspnea and exertion with mild cough. Smoking was in remission but she has relapsed smoking now. Overall she's recovering from physical deconditioning after the surgery but she feels this is improving. She does not want to attend physical therapy or home physical therapy. She says that she will rehabilitate by herself at home. She does have some chronic hoarseness of voice because of the inhalers but she does not want evaluation for this or change in inhaler therapy    Dg Chest 2 View  07/23/2014   CLINICAL DATA:  Hospital acquired pneumonia, smoking history  EXAM: CHEST  2 VIEW  COMPARISON:  Chest x-ray of 07/01/2014  FINDINGS: The lungs remain clear but hyperaerated with flattened hemidiaphragms and increased AP diameter consistent with emphysema. No infiltrate or effusion is seen. Mediastinal and hilar contours are unremarkable. Calcification of the mitral annulus again is noted and mild cardiomegaly is stable. The bones are osteopenic. A partially compressed mid thoracic vertebral body in the region of T6 may have compressed slightly more when compared to the CT chest of 05/12/2014.  IMPRESSION: 1. Emphysema.  No definite active infiltrate or effusion. 2. Stable cardiomegaly. 3. Perhaps slight more compression of T6 vertebral  body when compared to CT images from 05/12/2014 .   Electronically Signed   By: Ivar Drape M.D.   On: 07/23/2014 15:48      has a past medical history of HIP PAIN; HYPERTENSION; Restless leg syndrome; COPD (chronic obstructive pulmonary disease); Peptic ulcer disease; Hyponatremia; Tobacco abuse; Hypomagnesemia; LBBB (left bundle branch block); Pulmonary HTN; Mitral regurgitation; Respiratory failure; Abnormal echocardiogram; Aortic stenosis; Pulmonary HTN; Wide-complex tachycardia; and Chronic diastolic CHF (congestive heart failure).   reports that she has been smoking Cigarettes.  She has a 50 pack-year smoking history. She has never used smokeless tobacco.  Past Surgical History  Procedure Laterality Date  . Abdominal hysterectomy    . Dilation and curettage of uterus    . Tonsillectomy    . Esophagogastroduodenoscopy  07/09/2011    Procedure: ESOPHAGOGASTRODUODENOSCOPY (EGD);  Surgeon: Missy Sabins, MD;  Location: [facility];  Service: Endoscopy;  Laterality: N/A;  patient in room 1532  . Cataract extraction w/ intraocular lens  implant, bilateral Bilateral 03/2013    Patient claims it was within the month.   . Left and right heart catheterization with coronary angiogram N/A 12/02/2013    Procedure: LEFT AND RIGHT HEART CATHETERIZATION WITH CORONARY ANGIOGRAM;  Surgeon: Blane Ohara, MD;  Location: [facility] CATH LAB;  Service: Cardiovascular;  Laterality: N/A;    Allergies  Allergen Reactions  . Tetanus Toxoid Anaphylaxis  . Codeine Nausea And Vomiting and Other (See Comments)    Itching and faint   . Erythromycin Nausea And Vomiting  . Gabapentin     REACTION:  severe edema, nausea, dizziness, disorientation  . Meloxicam Itching  . Methylprednisolone Hives  . Penicillins Hives  . Prednisone Other (See Comments)    GI bleed    Immunization History  Administered Date(s) Administered  . Influenza Split 03/18/2012, 03/20/2013, 04/07/2014  . Influenza Whole 04/20/2005  .  Pneumococcal Polysaccharide-23 03/20/1989, 07/12/2011, 07/12/2011  . Td 01/19/1984  . Zoster 05/24/2011    Family History  Problem Relation Age of Onset  . Heart disease Sister   . Heart disease Brother   . Dementia Mother   . Anemia Father      Current outpatient prescriptions:  .  ALPRAZolam (XANAX) 0.25 MG tablet, Take 0.25 mg by mouth at bedtime as needed for anxiety., Disp: , Rfl:  .  aspirin 81 MG tablet, Take 81 mg by mouth every morning. , Disp: , Rfl:  .  budesonide-formoterol (SYMBICORT) 80-4.5 MCG/ACT inhaler, Inhale 2 puffs into the lungs 2 (two) times daily., Disp: 1 Inhaler, Rfl: 12 .  clonazePAM (KLONOPIN) 0.5 MG tablet, TAKE TWO TABLET BY MOUTH EVERY AT BEDTIME, Disp: 60 tablet, Rfl: 5 .  diltiazem (CARDIZEM CD) 120 MG 24 hr capsule, Take 1 capsule (120 mg total) by mouth daily., Disp: 30 capsule, Rfl: 12 .  furosemide (LASIX) 20 MG tablet, Take 1.5 tablets (30 mg total) by mouth daily., Disp: 45 tablet, Rfl: 1 .  guaiFENesin (MUCINEX) 600 MG 12 hr tablet, Take 600 mg by mouth 2 (two) times daily., Disp: , Rfl:  .  losartan (COZAAR) 50 MG tablet, Take 1 tablet (50 mg total) by mouth daily., Disp: 90 tablet, Rfl: 3 .  ondansetron (ZOFRAN) 4 MG tablet, Take 4 mg by mouth every 4 (four) hours as needed for nausea or vomiting., Disp: , Rfl:  .  Potassium Chloride ER 20 MEQ TBCR, Take 40 mEq by mouth 2 (two) times daily. (Patient taking differently: Take 20 mEq by mouth daily. ), Disp: 120 tablet, Rfl: 2 .  PROAIR HFA 108 (90 BASE) MCG/ACT inhaler, INHALE TWO PUFFS BY MOUTH EVERY FOUR HOURS AS NEEDED , Disp: 8.5 g, Rfl: 1 .  Respiratory Therapy Supplies (FLUTTER) DEVI, Use as directed, Disp: 1 each, Rfl: 0 .  saccharomyces boulardii (FLORASTOR) 250 MG capsule, Take 1 capsule (250 mg total) by mouth 2 (two) times daily., Disp: 60 capsule, Rfl: 0 .  tiotropium (SPIRIVA) 18 MCG inhalation capsule, Place 1 capsule (18 mcg total) into inhaler and inhale daily., Disp: 30 capsule,  Rfl: 12 .  TraMADol HCl 50 MG TBDP, Take 1 tablet by mouth every 6 (six) hours as needed. (Patient taking differently: Take 2 tablets by mouth every 6 (six) hours as needed. ), Disp: 45 tablet, Rfl: 0    Review of Systems  Constitutional: Negative for fever and unexpected weight change.  HENT: Negative for congestion, dental problem, ear pain, nosebleeds, postnasal drip, rhinorrhea, sinus pressure, sneezing, sore throat and trouble swallowing.   Eyes: Negative for redness and itching.  Respiratory: Positive for cough. Negative for chest tightness, shortness of breath and wheezing.   Cardiovascular: Negative for palpitations and leg swelling.  Gastrointestinal: Negative for nausea and vomiting.  Genitourinary: Negative for dysuria.  Musculoskeletal: Negative for joint swelling.  Skin: Negative for rash.  Neurological: Negative for headaches.  Hematological: Does not bruise/bleed easily.  Psychiatric/Behavioral: Negative for dysphoric mood. The patient is not nervous/anxious.        Objective:   Physical Exam  Constitutional: She is oriented to person, place, and time. She appears well-developed and  well-nourished. No distress.  Body mass index is 21.55 kg/(m^2).   HENT:  Head: Normocephalic and atraumatic.  Right Ear: External ear normal.  Left Ear: External ear normal.  Mouth/Throat: Oropharynx is clear and moist. No oropharyngeal exudate.  Eyes: Conjunctivae and EOM are normal. Pupils are equal, round, and reactive to light. Right eye exhibits no discharge. Left eye exhibits no discharge. No scleral icterus.  Neck: Normal range of motion. Neck supple. No JVD present. No tracheal deviation present. No thyromegaly present.  Cardiovascular: Normal rate, regular rhythm, normal heart sounds and intact distal pulses.  Exam reveals no gallop and no friction rub.   No murmur heard. Pulmonary/Chest: Effort normal and breath sounds normal. No respiratory distress. She has no wheezes. She  has no rales. She exhibits no tenderness.  Abdominal: Soft. Bowel sounds are normal. She exhibits no distension and no mass. There is no tenderness. There is no rebound and no guarding.  Surgical scar present sutures are removed Mild nonspecific postoperative tenderness present  Musculoskeletal: Normal range of motion. She exhibits no edema or tenderness.  Lymphadenopathy:    She has no cervical adenopathy.  Neurological: She is alert and oriented to person, place, and time. She has normal reflexes. No cranial nerve deficit. She exhibits normal muscle tone. Coordination normal.  Skin: Skin is warm and dry. No rash noted. She is not diaphoretic. No erythema. No pallor.  Psychiatric: She has a normal mood and affect. Her behavior is normal. Judgment and thought content normal.  Vitals reviewed.   Filed Vitals:   07/23/14 1546  BP: 110/56  Pulse: 112  Height: 5\' 1"  (1.549 m)  Weight: 114 lb (51.71 kg)  SpO2: 92%         Assessment & Plan:     ICD-9-CM ICD-10-CM   1. Chronic obstructive pulmonary disease, unspecified COPD, unspecified chronic bronchitis type 496 J44.9   2. Cachexia 799.4 R64   3. Pneumonia, organism unspecified 486 J18.9 DG Chest 2 View  4. Smoking 305.1 Z72.0      - No pneumonia on chest xray  - STart whey protein powder 1-2 scoops per day (optimum nutrition) - Copd is stable   - albuterol as needed, dailysymbicort and spiriva to continue  - will monitor hoarseness as discussed; if persists might neeed ENT Referral Please quit smoking    Followup  3 months or sooner if needed; followup discuss IMPACT COPD research trial

## 2014-07-23 NOTE — Telephone Encounter (Signed)
Call in #[phone] with 5 rf 

## 2014-07-29 ENCOUNTER — Encounter: Payer: Self-pay | Admitting: Internal Medicine

## 2014-07-29 DIAGNOSIS — E875 Hyperkalemia: Secondary | ICD-10-CM

## 2014-07-30 ENCOUNTER — Telehealth: Payer: Self-pay | Admitting: Internal Medicine

## 2014-07-30 NOTE — Telephone Encounter (Signed)
Pt called back to schedule her lab appt for tomorrow 07/31/14 at our office to check a bmet and Mg.  Will place appt in and link orders.

## 2014-07-30 NOTE — Telephone Encounter (Signed)
New problem   Pt stated she is returning your call. Please call pt.

## 2014-07-30 NOTE — Telephone Encounter (Signed)
Left a detailed message for the pt to call the office back to set a lab appt up per Dr Harrington Challenger to check a bmet and Mg, due to concerns pt sent via Mychart about low potassium levels.  Sent the pt a message through mychart to contact the office back to have this arranged.  Orders placed in epic and will be linked when the pt returns a call back with an appt date.  Will forward this message to Dr Harrington Challenger covering nurse for further review and follow-up.

## 2014-07-31 ENCOUNTER — Telehealth: Payer: Self-pay | Admitting: *Deleted

## 2014-07-31 ENCOUNTER — Other Ambulatory Visit (INDEPENDENT_AMBULATORY_CARE_PROVIDER_SITE_OTHER): Payer: Medicare Other | Admitting: *Deleted

## 2014-07-31 DIAGNOSIS — I10 Essential (primary) hypertension: Secondary | ICD-10-CM

## 2014-07-31 DIAGNOSIS — E875 Hyperkalemia: Secondary | ICD-10-CM

## 2014-07-31 DIAGNOSIS — E876 Hypokalemia: Secondary | ICD-10-CM

## 2014-07-31 LAB — BASIC METABOLIC PANEL
BUN: 7 mg/dL (ref 6–23)
CHLORIDE: 95 meq/L — AB (ref 96–112)
CO2: 30 meq/L (ref 19–32)
Calcium: 9.3 mg/dL (ref 8.4–10.5)
Creatinine, Ser: 0.56 mg/dL (ref 0.40–1.20)
GFR: 111.95 mL/min (ref >60.00)
GLUCOSE: 93 mg/dL (ref 70–99)
Potassium: 3.9 mEq/L (ref 3.5–5.1)
SODIUM: 132 meq/L — AB (ref 135–145)

## 2014-07-31 LAB — MAGNESIUM: Magnesium: 1.4 mg/dL — ABNORMAL LOW (ref 1.5–2.5)

## 2014-07-31 MED ORDER — MAGNESIUM OXIDE 400 MG PO TABS: 400.0000 mg | ORAL_TABLET | Freq: Every day | ORAL | Status: DC

## 2014-07-31 NOTE — Telephone Encounter (Signed)
Spoke with pt and reviewed lab work and recommendations from [employer], [place] with her. Will send prescription for MgOx to [facility] [address]. She will come in for lab work on 08/14/14

## 2014-07-31 NOTE — Telephone Encounter (Signed)
I placed call to pt to review lab results and instructions from [place], [place]. Left message to call back

## 2014-08-04 ENCOUNTER — Other Ambulatory Visit: Payer: Self-pay | Admitting: Family Medicine

## 2014-08-04 NOTE — Telephone Encounter (Signed)
I do not see this medication on current list? 

## 2014-08-05 ENCOUNTER — Telehealth: Payer: Self-pay | Admitting: *Deleted

## 2014-08-05 NOTE — Telephone Encounter (Signed)
-----   Message -----     From: Fay Records, MD     Sent: 08/04/2014  8:43 AM      To: Stanton Kidney, RN    Subject: RE: Non-Urgent Medical Question               Place on 400 mg Mg Oxide daily Check BMET and MG in 2 wks     Message left with new med start to be picked up at [facility]/ [facility]. Told her to call back to schedule 2 week lab date, orders written. Return number given.

## 2014-08-14 ENCOUNTER — Other Ambulatory Visit (INDEPENDENT_AMBULATORY_CARE_PROVIDER_SITE_OTHER): Payer: Medicare Other | Admitting: *Deleted

## 2014-08-14 DIAGNOSIS — I10 Essential (primary) hypertension: Secondary | ICD-10-CM

## 2014-08-14 LAB — BASIC METABOLIC PANEL
BUN: 11 mg/dL (ref 6–23)
CALCIUM: 10.1 mg/dL (ref 8.4–10.5)
CO2: 32 mEq/L (ref 19–32)
Chloride: 91 mEq/L — ABNORMAL LOW (ref 96–112)
Creatinine, Ser: 0.71 mg/dL (ref 0.40–1.20)
GFR: 85.12 mL/min (ref >60.00)
Glucose, Bld: 107 mg/dL — ABNORMAL HIGH (ref 70–99)
POTASSIUM: 3.5 meq/L (ref 3.5–5.1)
Sodium: 130 mEq/L — ABNORMAL LOW (ref 135–145)

## 2014-08-14 NOTE — Addendum Note (Signed)
Addended by: Eulis Foster on: 08/14/2014 11:41 AM   Modules accepted: Orders

## 2014-08-15 ENCOUNTER — Other Ambulatory Visit (INDEPENDENT_AMBULATORY_CARE_PROVIDER_SITE_OTHER): Payer: Medicare Other

## 2014-08-15 ENCOUNTER — Telehealth: Payer: Self-pay | Admitting: *Deleted

## 2014-08-15 DIAGNOSIS — I10 Essential (primary) hypertension: Secondary | ICD-10-CM

## 2014-08-15 DIAGNOSIS — I5032 Chronic diastolic (congestive) heart failure: Secondary | ICD-10-CM

## 2014-08-15 LAB — MAGNESIUM: Magnesium: 1.5 mg/dL (ref 1.5–2.5)

## 2014-08-15 MED ORDER — POTASSIUM CHLORIDE ER 20 MEQ PO TBCR: 20.0000 meq | EXTENDED_RELEASE_TABLET | Freq: Every day | ORAL | Status: DC

## 2014-08-15 NOTE — Telephone Encounter (Signed)
DOSE CHANGE ON K+

## 2014-08-15 NOTE — Telephone Encounter (Signed)
pt notified about lab results. I stated to pt lab did not draw Mag level so I have added this onto to her lab from yesterday so results should be in later this evening. I asked pt if still taking K+ supp, she said yes. I stated K+ low end normal 3.5. Pt began to increase her tone with me states "yes that is normal". She would not let me explain to her about the lab results and why certain labs need to be at certain levels. I agreed, however; I said her K+ had dropped from 2/11 3.9 to 2/25 3.5. I also advised her Na is lower at 130 on 2/25 from 2/11 132. Pt then proceeds to explain that she was on K+ 80 meq daily (40 meq bid). Pt states this dose caused N/V, so she decreased herself to K+ 20 meq daily. I explained that this was probably too big of a decrease which caused her K+ to drop to 3.5. Pt said she is concerned about her Na level lower as well. I explained to pt that I will make note of the K+ 80 meq causing N/V. I did tell her that Richardson Dopp, [place] may say to try K+ 40 meq with repeat bmet to see if we can get K+ up a little. Pt is on lasix 30 mg daily. Pt stated she wants it to be know that she "just had colon surgery at [facility] 07/03/14 and that her electrolytes are probably still not in balance". Pt said she has messaged Dr. Harrington Challenger a few times through [place] about her lab results in the recent past. I stated I will d/w Nicki Reaper W. PA who is not in the office today  about our conversation. I advised I will cb to let her know of what recommendations Scott W. PA may have about K+ dose. At the end of our conversation pt apologized to me for being rude, said she did not mean to be rude just wants [place] to understand she is still in the healing phase from her 07/03/14 colon surgery. Pt said thank you.

## 2014-08-17 ENCOUNTER — Other Ambulatory Visit: Payer: Self-pay | Admitting: Family Medicine

## 2014-08-18 NOTE — Telephone Encounter (Signed)
NO we refilled this on 07-23-14

## 2014-08-18 NOTE — Telephone Encounter (Signed)
lmptcb to go over [place] level lab results and recommendations per [employer]. PA.

## 2014-08-19 ENCOUNTER — Telehealth: Payer: Self-pay | Admitting: Internal Medicine

## 2014-08-19 ENCOUNTER — Telehealth: Payer: Self-pay | Admitting: Family Medicine

## 2014-08-19 MED ORDER — POTASSIUM CHLORIDE ER 20 MEQ PO TBCR: 20.0000 meq | EXTENDED_RELEASE_TABLET | Freq: Two times a day (BID) | ORAL | Status: DC

## 2014-08-19 NOTE — Telephone Encounter (Signed)
[facility] And they increase her furosemide to 30 mg once a day. [facility] [address] Pt needs new rx # 30 W/refills sent to pharm

## 2014-08-19 NOTE — Telephone Encounter (Signed)
C-

## 2014-08-19 NOTE — Addendum Note (Signed)
Addended by: Michae Kava on: 08/19/2014 10:00 AM   Modules accepted: Orders

## 2014-08-19 NOTE — Telephone Encounter (Signed)
Forwarded note to Dr Harrington Challenger for review, pt aware she will hear from cardiology

## 2014-08-19 NOTE — Telephone Encounter (Signed)
New message     Returning Jill White's call to talk about labs

## 2014-08-19 NOTE — Telephone Encounter (Signed)
pt notified to increase magnesium to twice daily x 5 then go back to once daily, increase K+ to 20 meq BID, bmet 3/7. Pt verbalized understanding to plan of care.

## 2014-08-19 NOTE — Telephone Encounter (Signed)
She has been in close contact with the Cardiology office about her electrolyte and fluid levels, and I think they should manage the Lasix as well

## 2014-08-20 ENCOUNTER — Other Ambulatory Visit: Payer: Self-pay | Admitting: *Deleted

## 2014-08-20 MED ORDER — FUROSEMIDE 20 MG PO TABS: 30.0000 mg | ORAL_TABLET | Freq: Every day | ORAL | Status: DC

## 2014-08-20 NOTE — Telephone Encounter (Signed)
New Rx for lasix 30 mg was sent in earlier looks like by Dr. Harrington Challenger

## 2014-08-20 NOTE — Telephone Encounter (Signed)
Agree  Patient appears to be set for f/u labs next week.  Will follow up with her

## 2014-08-25 ENCOUNTER — Other Ambulatory Visit (INDEPENDENT_AMBULATORY_CARE_PROVIDER_SITE_OTHER): Payer: Medicare Other | Admitting: *Deleted

## 2014-08-25 ENCOUNTER — Telehealth: Payer: Self-pay | Admitting: *Deleted

## 2014-08-25 DIAGNOSIS — I5032 Chronic diastolic (congestive) heart failure: Secondary | ICD-10-CM | POA: Diagnosis not present

## 2014-08-25 DIAGNOSIS — I10 Essential (primary) hypertension: Secondary | ICD-10-CM | POA: Diagnosis not present

## 2014-08-25 LAB — BASIC METABOLIC PANEL
BUN: 14 mg/dL (ref 6–23)
CO2: 30 mEq/L (ref 19–32)
CREATININE: 0.83 mg/dL (ref 0.40–1.20)
Calcium: 9.4 mg/dL (ref 8.4–10.5)
Chloride: 94 mEq/L — ABNORMAL LOW (ref 96–112)
GFR: 71.08 mL/min (ref >60.00)
Glucose, Bld: 86 mg/dL (ref 70–99)
Potassium: 3.7 mEq/L (ref 3.5–5.1)
Sodium: 131 mEq/L — ABNORMAL LOW (ref 135–145)

## 2014-08-25 LAB — MAGNESIUM: Magnesium: 1.7 mg/dL (ref 1.5–2.5)

## 2014-08-25 NOTE — Telephone Encounter (Signed)
pt notified about lab results with verbal understanding  

## 2014-08-25 NOTE — Addendum Note (Signed)
Addended by: Eulis Foster on: 08/25/2014 11:30 AM   Modules accepted: Orders

## 2014-09-16 ENCOUNTER — Telehealth: Payer: Self-pay

## 2014-09-16 MED ORDER — FUROSEMIDE 20 MG PO TABS: 30.0000 mg | ORAL_TABLET | Freq: Every day | ORAL | Status: DC

## 2014-09-16 NOTE — Telephone Encounter (Signed)
Notes Recorded by Michae Kava, CMA on 08/19/2014 at 9:43 AM pt notified about lab results and to increase K+ to 60 meq daily while taking increased dose of lasix for a few days; then she will go to K+ 40 meq once a day, bmet 3/9. Pt verbalized understanding to Plan of care.

## 2014-09-29 ENCOUNTER — Ambulatory Visit (INDEPENDENT_AMBULATORY_CARE_PROVIDER_SITE_OTHER): Payer: Medicare Other | Admitting: Internal Medicine

## 2014-09-29 ENCOUNTER — Encounter: Payer: Self-pay | Admitting: Internal Medicine

## 2014-09-29 VITALS — BP 140/66 | HR 95 | Ht 61.0 in | Wt 115.6 lb

## 2014-09-29 DIAGNOSIS — I5032 Chronic diastolic (congestive) heart failure: Secondary | ICD-10-CM

## 2014-09-29 DIAGNOSIS — I10 Essential (primary) hypertension: Secondary | ICD-10-CM | POA: Diagnosis not present

## 2014-09-29 LAB — BASIC METABOLIC PANEL
BUN: 13 mg/dL (ref 6–23)
CHLORIDE: 95 meq/L — AB (ref 96–112)
CO2: 33 mEq/L — ABNORMAL HIGH (ref 19–32)
Calcium: 9.4 mg/dL (ref 8.4–10.5)
Creatinine, Ser: 0.53 mg/dL (ref 0.40–1.20)
GFR: 119.24 mL/min (ref >60.00)
Glucose, Bld: 86 mg/dL (ref 70–99)
Potassium: 3.8 mEq/L (ref 3.5–5.1)
SODIUM: 134 meq/L — AB (ref 135–145)

## 2014-09-29 LAB — MAGNESIUM: Magnesium: 1.6 mg/dL (ref 1.5–2.5)

## 2014-09-29 MED ORDER — POTASSIUM CHLORIDE ER 10 MEQ PO TBCR: 10.0000 meq | EXTENDED_RELEASE_TABLET | Freq: Four times a day (QID) | ORAL | Status: DC

## 2014-09-29 NOTE — Progress Notes (Signed)
Cardiology Office Note   Date:  09/29/2014   ID:  Jill White, DOB [birth year], MRN [id number]  PCP:  Laurey Morale, MD  Cardiologist:   Dorris Carnes, MD   No chief complaint on file.     History of Present Illness: Jill White is a 74 y.o. female with a history of severe COPD,  An echo in past  that showed mild LV dysfunction and moderate to severe AS Underwent R and L heart cath that showed calcified coronary arteries but no signif CAD Mild AS with a mean gradient of 7 mm Hg. Milldy elevated R sided pressures. LVEF normal at 60^   Since I saw her in clinic she underwent colon in [place]  she says her breathing has been stable  Denies CP  No dizziness  She denies palpitations.        Current Outpatient Prescriptions  Medication Sig Dispense Refill  . ALPRAZolam (XANAX) 0.25 MG tablet Take 0.25 mg by mouth at bedtime as needed for anxiety.    Marland Kitchen aspirin 81 MG tablet Take 81 mg by mouth every morning.     . budesonide-formoterol (SYMBICORT) 80-4.5 MCG/ACT inhaler Inhale 2 puffs into the lungs 2 (two) times daily. 1 Inhaler 12  . clonazePAM (KLONOPIN) 0.5 MG tablet TAKE TWO TABLET BY MOUTH EVERY AT BEDTIME 60 tablet 5  . diltiazem (CARDIZEM CD) 120 MG 24 hr capsule Take 1 capsule (120 mg total) by mouth daily. 30 capsule 12  . furosemide (LASIX) 20 MG tablet Take 1.5 tablets (30 mg total) by mouth daily. 45 tablet 0  . guaiFENesin (MUCINEX) 600 MG 12 hr tablet Take 600 mg by mouth 2 (two) times daily.    Marland Kitchen losartan (COZAAR) 50 MG tablet Take 1 tablet (50 mg total) by mouth daily. 90 tablet 3  . magnesium oxide (MAG-OX) 400 MG tablet Take 1 tablet (400 mg total) by mouth daily. 30 tablet 6  . ondansetron (ZOFRAN) 4 MG tablet Take 4 mg by mouth every 4 (four) hours as needed for nausea or vomiting.    . Potassium Chloride ER 20 MEQ TBCR Take 20 mEq by mouth 2 (two) times daily.    Marland Kitchen PROAIR HFA 108 (90 BASE) MCG/ACT inhaler INHALE TWO PUFFS BY MOUTH EVERY FOUR HOURS AS NEEDED  8.5  g 1  . Respiratory Therapy Supplies (FLUTTER) DEVI Use as directed 1 each 0  . saccharomyces boulardii (FLORASTOR) 250 MG capsule Take 1 capsule (250 mg total) by mouth 2 (two) times daily. 60 capsule 0  . tiotropium (SPIRIVA) 18 MCG inhalation capsule Place 1 capsule (18 mcg total) into inhaler and inhale daily. 30 capsule 12  . TraMADol HCl 50 MG TBDP Take 1 tablet by mouth every 6 (six) hours as needed. (Patient taking differently: Take 2 tablets by mouth every 6 (six) hours as needed. ) 45 tablet 0   No current facility-administered medications for this visit.    Allergies:   Tetanus toxoid; Codeine; Erythromycin; Gabapentin; Meloxicam; Methylprednisolone; Penicillins; and Prednisone   Past Medical History  Diagnosis Date  . HIP PAIN   . HYPERTENSION   . Restless leg syndrome   . COPD (chronic obstructive pulmonary disease)     a. Multiple prior admissions for acute hypoxic respiratory failure in setting of COPD exacerbations +/- pneumonia.  . Peptic ulcer disease     a. 2013: gastric antrum.  Marland Kitchen Hyponatremia   . Tobacco abuse   . Hypomagnesemia   . LBBB (left bundle branch  block)     a. Intermittent, likely rate related.  . Pulmonary HTN     a. Elevated PA pressure by prior echoes.  . Mitral regurgitation     a. Mild MR by echo 2014, not mentioned on 11/2013 (severely calcified annulus).  Marland Kitchen Respiratory failure     a. Adm 11/2013 with severe hypercarbic and hypoxemic respiratory failure requiring intubation.   . Abnormal echocardiogram     a. 11/2013: decreased EF 45-50%, moderate AS- > subsequent cath 12/02/13 with mildly calcified cors without obstruction, mild AS, EF 60%, mild pulm HTN.  Marland Kitchen Aortic stenosis     a. 11/2013: mod by echo, then mild by cath.  . Pulmonary HTN     a. 11/2013: mild by cath.  . Wide-complex tachycardia     a. Felt likely atrial tach with rate-related LBBB.  Marland Kitchen Chronic diastolic CHF (congestive heart failure)     Past Surgical History  Procedure  Laterality Date  . Abdominal hysterectomy    . Dilation and curettage of uterus    . Tonsillectomy    . Esophagogastroduodenoscopy  07/09/2011    Procedure: ESOPHAGOGASTRODUODENOSCOPY (EGD);  Surgeon: Missy Sabins, MD;  Location: [facility];  Service: Endoscopy;  Laterality: N/A;  patient in room 1532  . Cataract extraction w/ intraocular lens  implant, bilateral Bilateral 03/2013    Patient claims it was within the month.   . Left and right heart catheterization with coronary angiogram N/A 12/02/2013    Procedure: LEFT AND RIGHT HEART CATHETERIZATION WITH CORONARY ANGIOGRAM;  Surgeon: Blane Ohara, MD;  Location: [name] CATH LAB;  Service: Cardiovascular;  Laterality: N/A;     Social History:  The patient  reports that she has been smoking Cigarettes.  She has a 50 pack-year smoking history. She has never used smokeless tobacco. She reports that she drinks about 0.6 oz of alcohol per week. She reports that she does not use illicit drugs.   Family History:  The patient's family history includes Anemia in her father; Dementia in her mother; Heart disease in her brother and sister.    ROS:  Please see the history of present illness. All other systems are reviewed and  Negative to the above problem except as noted.    PHYSICAL EXAM: VS:  BP 140/66 mmHg  Pulse 95  Ht 5\' 1"  (1.549 m)  Wt 115 lb 9.6 oz (52.436 kg)  BMI 21.85 kg/m2  GEN: Well nourished, well developed, in no acute distress HEENT: normal Neck: no JVD, carotid bruits, or masses Cardiac: RRR;  Gr II/VI systolic murmur, or gallops,no edema  Respiratory:  Decreased airflow   GI: soft, nontender, nondistended, + BS  No hepatomegaly  MS: no deformity Moving all extremities   Skin: warm and dry, no rash Neuro:  Strength and sensation are intact Psych: euthymic mood, full affect   EKG:  EKG is ordered today.  Sinus rhythm 95 bpm  Freq PACs  LBBB   Lipid Panel    Component Value Date/Time   CHOL 166 11/04/2010 0807    TRIG 129 11/28/2013 1600   HDL 71.70 11/04/2010 0807   CHOLHDL 2 11/04/2010 0807   VLDL 21.2 11/04/2010 0807   LDLCALC 73 11/04/2010 0807      Wt Readings from Last 3 Encounters:  09/29/14 115 lb 9.6 oz (52.436 kg)  07/23/14 114 lb (51.71 kg)  07/09/14 121 lb (54.885 kg)      ASSESSMENT AND PLAN: 1.  Diastolic/systolic CHF  Chronic  Volume  status is good  Check labss  2.  Aortic stenosis.  No changes WIll need periodic f/u  3.  COPD  Severe.  Moving air.  Counselled again on smoking       Signed, Dorris Carnes, MD  09/29/2014 9:16 AM    [address] [place], [place], [place]  [address] Phone: [phone]; Fax: [phone]

## 2014-09-29 NOTE — Patient Instructions (Addendum)
Your physician recommends that you continue on your current medications as directed. Please refer to the Current Medication list given to you today. Your physician recommends that you return for lab work in: [place] (BMET, [place])  Your physician wants you to follow-up in: [place].  You will receive a reminder letter in the mail two months in advance. If you don't receive a letter, please call our office to schedule the follow-up appointment.

## 2014-09-30 ENCOUNTER — Telehealth: Payer: Self-pay | Admitting: Internal Medicine

## 2014-09-30 ENCOUNTER — Encounter: Payer: Self-pay | Admitting: Internal Medicine

## 2014-09-30 ENCOUNTER — Ambulatory Visit (INDEPENDENT_AMBULATORY_CARE_PROVIDER_SITE_OTHER): Payer: Medicare Other | Admitting: Internal Medicine

## 2014-09-30 VITALS — BP 148/82 | HR 105 | Ht 61.0 in | Wt 116.0 lb

## 2014-09-30 DIAGNOSIS — J449 Chronic obstructive pulmonary disease, unspecified: Secondary | ICD-10-CM | POA: Diagnosis not present

## 2014-09-30 DIAGNOSIS — Z720 Tobacco use: Secondary | ICD-10-CM

## 2014-09-30 DIAGNOSIS — F17200 Nicotine dependence, unspecified, uncomplicated: Secondary | ICD-10-CM

## 2014-09-30 NOTE — Progress Notes (Signed)
Subjective:    Patient ID: Jill White, female    DOB: [birth year], 74 y.o.   MRN: [id number]  HPI   OV 09/30/2014  Chief Complaint  Patient presents with  . Follow-up    Pt stated her breathing is unchanged. Pt stated she is doing well. Pt c/o clear prod cough, PND, sius pressure and rhinorrhea d/t pollen. Pt denies CP/tightness.     Follow-up moderate COPD with recurrent exacerbation. Most recent chest x-ray February 2016 is clear without any infiltrates. Last seen through 2016. This is a routine scheduled follow-up. Since her last visit she's been taking whey protein powder which has helped her muscle strength, conditioning and cachexia. She still not interested in pulmonary rehabilitation. She's compliant with her Spiriva and Symbicort. She does not want any change. She's not interested in research trials. She continues to smoke and is not interested in quitting. She only wants follow-up every 6 months. There are no other issues. She still has mild chronic hoarseness of voice which she attributes to inhalers and does not want evaluation    has a past medical history of HIP PAIN; HYPERTENSION; Restless leg syndrome; COPD (chronic obstructive pulmonary disease); Peptic ulcer disease; Hyponatremia; Tobacco abuse; Hypomagnesemia; LBBB (left bundle branch block); Pulmonary HTN; Mitral regurgitation; Respiratory failure; Abnormal echocardiogram; Aortic stenosis; Pulmonary HTN; Wide-complex tachycardia; and Chronic diastolic CHF (congestive heart failure).   reports that she has been smoking Cigarettes.  She has a 50 pack-year smoking history. She has never used smokeless tobacco.  Past Surgical History  Procedure Laterality Date  . Abdominal hysterectomy    . Dilation and curettage of uterus    . Tonsillectomy    . Esophagogastroduodenoscopy  07/09/2011    Procedure: ESOPHAGOGASTRODUODENOSCOPY (EGD);  Surgeon: Missy Sabins, MD;  Location: [facility];  Service: Endoscopy;  Laterality:  N/A;  patient in room 1532  . Cataract extraction w/ intraocular lens  implant, bilateral Bilateral 03/2013    Patient claims it was within the month.   . Left and right heart catheterization with coronary angiogram N/A 12/02/2013    Procedure: LEFT AND RIGHT HEART CATHETERIZATION WITH CORONARY ANGIOGRAM;  Surgeon: Blane Ohara, MD;  Location: [facility] CATH LAB;  Service: Cardiovascular;  Laterality: N/A;    Allergies  Allergen Reactions  . Tetanus Toxoid Anaphylaxis  . Codeine Nausea And Vomiting and Other (See Comments)    Itching and faint   . Erythromycin Nausea And Vomiting  . Gabapentin     REACTION: severe edema, nausea, dizziness, disorientation  . Meloxicam Itching  . Methylprednisolone Hives  . Penicillins Hives  . Prednisone Other (See Comments)    GI bleed    Immunization History  Administered Date(s) Administered  . Influenza Split 03/18/2012, 03/20/2013, 04/07/2014  . Influenza Whole 04/20/2005  . Pneumococcal Polysaccharide-23 03/20/1989, 07/12/2011, 07/12/2011  . Td 01/19/1984  . Zoster 05/24/2011    Family History  Problem Relation Age of Onset  . Heart disease Sister   . Heart disease Brother   . Dementia Mother   . Anemia Father      Current outpatient prescriptions:  .  ALPRAZolam (XANAX) 0.25 MG tablet, Take 0.25 mg by mouth at bedtime as needed for anxiety., Disp: , Rfl:  .  aspirin 81 MG tablet, Take 81 mg by mouth every morning. , Disp: , Rfl:  .  budesonide-formoterol (SYMBICORT) 80-4.5 MCG/ACT inhaler, Inhale 2 puffs into the lungs 2 (two) times daily., Disp: 1 Inhaler, Rfl: 12 .  clonazePAM (KLONOPIN) 0.5  MG tablet, TAKE TWO TABLET BY MOUTH EVERY AT BEDTIME, Disp: 60 tablet, Rfl: 5 .  diltiazem (CARDIZEM CD) 120 MG 24 hr capsule, Take 1 capsule (120 mg total) by mouth daily., Disp: 30 capsule, Rfl: 12 .  furosemide (LASIX) 20 MG tablet, Take 1.5 tablets (30 mg total) by mouth daily., Disp: 45 tablet, Rfl: 0 .  guaiFENesin (MUCINEX) 600 MG 12 hr  tablet, Take 600 mg by mouth 2 (two) times daily., Disp: , Rfl:  .  losartan (COZAAR) 50 MG tablet, Take 1 tablet (50 mg total) by mouth daily., Disp: 90 tablet, Rfl: 3 .  magnesium oxide (MAG-OX) 400 MG tablet, Take 1 tablet (400 mg total) by mouth daily., Disp: 30 tablet, Rfl: 6 .  ondansetron (ZOFRAN) 4 MG tablet, Take 4 mg by mouth every 4 (four) hours as needed for nausea or vomiting., Disp: , Rfl:  .  potassium chloride (K-DUR) 10 MEQ tablet, Take 1 tablet (10 mEq total) by mouth 4 (four) times daily., Disp: 120 tablet, Rfl: 6 .  PROAIR HFA 108 (90 BASE) MCG/ACT inhaler, INHALE TWO PUFFS BY MOUTH EVERY FOUR HOURS AS NEEDED , Disp: 8.5 g, Rfl: 1 .  Respiratory Therapy Supplies (FLUTTER) DEVI, Use as directed, Disp: 1 each, Rfl: 0 .  tiotropium (SPIRIVA) 18 MCG inhalation capsule, Place 1 capsule (18 mcg total) into inhaler and inhale daily., Disp: 30 capsule, Rfl: 12 .  TraMADol HCl 50 MG TBDP, Take 1 tablet by mouth every 6 (six) hours as needed. (Patient taking differently: Take 2 tablets by mouth every 6 (six) hours as needed. ), Disp: 45 tablet, Rfl: 0     Review of Systems  Constitutional: Negative for fever and unexpected weight change.  HENT: Positive for congestion, postnasal drip, rhinorrhea, sinus pressure and sneezing. Negative for dental problem, ear pain, nosebleeds, sore throat and trouble swallowing.   Eyes: Negative for redness and itching.  Respiratory: Negative for cough, chest tightness, shortness of breath and wheezing.   Cardiovascular: Negative for palpitations and leg swelling.  Gastrointestinal: Negative for nausea and vomiting.  Genitourinary: Negative for dysuria.  Musculoskeletal: Negative for joint swelling.  Skin: Negative for rash.  Neurological: Negative for headaches.  Hematological: Does not bruise/bleed easily.  Psychiatric/Behavioral: Negative for dysphoric mood. The patient is not nervous/anxious.        Objective:   Physical Exam  Filed  Vitals:   09/30/14 0947  BP: 148/82  Pulse: 105  Height: 5\' 1"  (1.549 m)  Weight: 116 lb (52.617 kg)  SpO2: 92%   onstitutional: She is oriented to person, place, and time. She appears well-developed and well-nourished. No distress.  Body mass index is 21.55 kg/(m^2). Body mass index is 21.93 kg/(m^2). 09/30/2014 - gaining weight with whey    HENT:  Head: Normocephalic and atraumatic.  Right Ear: External ear normal.  Left Ear: External ear normal.  Mouth/Throat: Oropharynx is clear and moist. No oropharyngeal exudate.  Eyes: Conjunctivae and EOM are normal. Pupils are equal, round, and reactive to light. Right eye exhibits no discharge. Left eye exhibits no discharge. No scleral icterus.  Neck: Normal range of motion. Neck supple. No JVD present. No tracheal deviation present. No thyromegaly present.  Cardiovascular: Normal rate, regular rhythm, normal heart sounds and intact distal pulses.  Exam reveals no gallop and no friction rub.   No murmur heard. Pulmonary/Chest: Effort normal and breath sounds normal. No respiratory distress. She has no wheezes. She has no rales. She exhibits no tenderness.  Abdominal: Soft.  Bowel sounds are normal. She exhibits no distension and no mass. There is no tenderness. There is no rebound and no guarding.  Surgical scar present sutures are removed Musculoskeletal: Normal range of motion. She exhibits no edema or tenderness.  Lymphadenopathy:    She has no cervical adenopathy.  Neurological: She is alert and oriented to person, place, and time. She has normal reflexes. No cranial nerve deficit. She exhibits normal muscle tone. Coordination normal.  Skin: Skin is warm and dry. No rash noted. She is not diaphoretic. No erythema. No pallor.  Psychiatric: She has a normal mood and affect. Her behavior is normal. Judgment and thought content normal.  Vitals reviewed.     Assessment & Plan:     ICD-9-CM ICD-10-CM   1. Chronic obstructive pulmonary  disease, unspecified COPD, unspecified chronic bronchitis type 496 J44.9   2. Smoking 305.1 Z72.0     Copd is stable   - albuterol as needed, dailysymbicort and spiriva to continue  - will monitor hoarseness as discussed; might need ENT referral Please quit smoking    Followup  6 months or sooner if needed     Dr. Brand Males, M.D., [facility].C.P Pulmonary and Critical Care Medicine Staff Physician [place] Pager: [phone], If no answer or between  15:00h - 7:00h: call [phone]  09/30/2014 10:32 AM

## 2014-09-30 NOTE — Patient Instructions (Addendum)
ICD-9-CM ICD-10-CM   1. Chronic obstructive pulmonary disease, unspecified COPD, unspecified chronic bronchitis type 496 J44.9   2. Smoking 305.1 Z72.0    Copd is stable   - albuterol as needed, dailysymbicort and spiriva to continue  - will monitor hoarseness as discussed; might need ENT referral Please quit smoking    Followup  6 months or sooner if needed

## 2014-09-30 NOTE — Telephone Encounter (Signed)
Pt made aware of lab results.

## 2014-09-30 NOTE — Telephone Encounter (Signed)
New Message   Patient is returning nurses call. Please call back. Thanks.

## 2014-10-03 ENCOUNTER — Telehealth: Payer: Self-pay | Admitting: Internal Medicine

## 2014-10-03 NOTE — Telephone Encounter (Signed)
Pt gave [name] a post card at last OV 09/30/14 which stated that the patient needed to be seen between a specific time frame.  Called [facility], states that appt was supposed to be to follow up on O2 -- O2 sat re-qualification?? Transferred to speak with RT - LM with [facility] to return call.   Will send to [facility] as FYI and to follow up.

## 2014-10-06 ENCOUNTER — Encounter: Payer: Self-pay | Admitting: Internal Medicine

## 2014-10-07 NOTE — Telephone Encounter (Signed)
[name], [place], returned call. [name] stated she has faxed over a form for MR to sign and nothing is need from pt.   LMTCB for pt to let her know.

## 2014-10-07 NOTE — Telephone Encounter (Signed)
Called Jill White and LMTCB to see if there is something needed on this pt  ? Did she need to re-qualify?  Spoke with the pt and notified that I have called [place] and will find out what is needed We will let her know if she needs to do anything She verbalized understanding

## 2014-10-07 NOTE — Telephone Encounter (Signed)
Received call from [place], [facility], and was advised nothing is needed from pt. They have faxed over a form for MR to sign. LMTCB for pt.   Please see phone note from 4/15. Will sign off.

## 2014-10-07 NOTE — Telephone Encounter (Signed)
Spoke with the pt and notified of recs per [name]  Nothing further needed

## 2014-10-07 NOTE — Telephone Encounter (Signed)
See phone note dated 10/07/14

## 2014-10-07 NOTE — Telephone Encounter (Signed)
Pt returning call.Jill White ° °

## 2014-10-14 ENCOUNTER — Other Ambulatory Visit: Payer: Self-pay | Admitting: *Deleted

## 2014-10-14 MED ORDER — FUROSEMIDE 20 MG PO TABS: 30.0000 mg | ORAL_TABLET | Freq: Every day | ORAL | Status: DC

## 2014-10-30 ENCOUNTER — Other Ambulatory Visit: Payer: Self-pay | Admitting: Family Medicine

## 2014-11-23 ENCOUNTER — Encounter: Payer: Self-pay | Admitting: Internal Medicine

## 2014-11-25 ENCOUNTER — Telehealth: Payer: Self-pay | Admitting: *Deleted

## 2014-11-25 DIAGNOSIS — I509 Heart failure, unspecified: Secondary | ICD-10-CM

## 2014-11-25 NOTE — Telephone Encounter (Signed)
Lab work needed is BMP

## 2014-11-25 NOTE — Telephone Encounter (Signed)
Spoke with pt and gave her instructions from Dr.Ross.  Pt has been taking KCl 10 meq 4 times daily and is requesting clarification--Does Dr.Ross want her to take one 10 meq tablet daily.  Also would like to know if she should continue Magnesium?  BMP scheduled for December 08, 2014.  Pt is asking if she needs Magnesium checked also.

## 2014-11-25 NOTE — Telephone Encounter (Signed)
In response to pt's my chart message Dr Harrington Challenger sent message for pt to stop Losartan HCTZ.  Pt should take Losartan and KCL once daily.  Will need blood work in 10-14 days.  I sent this message to pt via my chart. I placed call to pt to schedule lab work.  Left message to call back

## 2014-11-25 NOTE — Telephone Encounter (Signed)
Pt rtn call-didn't understand what KCL is and says that didn't answer her question-requesting to talk to Pat-pls call [phone]

## 2014-11-26 MED ORDER — POTASSIUM CHLORIDE ER 10 MEQ PO TBCR: 10.0000 meq | EXTENDED_RELEASE_TABLET | Freq: Every day | ORAL | Status: DC

## 2014-11-26 NOTE — Telephone Encounter (Signed)
Spoke with patient. She verbalizes understanding to take potassium 10 meq daily and continue with magnesium. Confirmed lab appt for 12/08/14. She is appreciative for the help with clarification. Pt plans to change pharmacies so instructed her to let us know when she does.

## 2014-11-26 NOTE — Telephone Encounter (Signed)
Left message for patient to call back.  Will advise to continue Magnesium and take losartan and one 10 meq of potassium daily. Mag level added to BMP for 12/08/14.

## 2014-11-26 NOTE — Telephone Encounter (Signed)
I would keep on magnesium Can check on next  Lab draw with other labs. (mg) No other change

## 2014-12-08 ENCOUNTER — Other Ambulatory Visit (INDEPENDENT_AMBULATORY_CARE_PROVIDER_SITE_OTHER): Payer: Medicare Other | Admitting: *Deleted

## 2014-12-08 DIAGNOSIS — I509 Heart failure, unspecified: Secondary | ICD-10-CM

## 2014-12-08 LAB — BASIC METABOLIC PANEL
BUN: 13 mg/dL (ref 6–23)
CO2: 32 mEq/L (ref 19–32)
CREATININE: 0.67 mg/dL (ref 0.40–1.20)
Calcium: 9.8 mg/dL (ref 8.4–10.5)
Chloride: 93 mEq/L — ABNORMAL LOW (ref 96–112)
GFR: 90.94 mL/min (ref >60.00)
Glucose, Bld: 99 mg/dL (ref 70–99)
POTASSIUM: 3.7 meq/L (ref 3.5–5.1)
Sodium: 132 mEq/L — ABNORMAL LOW (ref 135–145)

## 2014-12-08 LAB — MAGNESIUM: Magnesium: 1.7 mg/dL (ref 1.5–2.5)

## 2014-12-09 ENCOUNTER — Ambulatory Visit (INDEPENDENT_AMBULATORY_CARE_PROVIDER_SITE_OTHER): Payer: Medicare Other | Admitting: Family Medicine

## 2014-12-09 ENCOUNTER — Encounter: Payer: Self-pay | Admitting: Family Medicine

## 2014-12-09 VITALS — BP 134/67 | HR 97 | Temp 98.2°F | Ht 61.0 in | Wt 118.0 lb

## 2014-12-09 DIAGNOSIS — M25572 Pain in left ankle and joints of left foot: Secondary | ICD-10-CM

## 2014-12-09 DIAGNOSIS — J018 Other acute sinusitis: Secondary | ICD-10-CM | POA: Diagnosis not present

## 2014-12-09 MED ORDER — AZITHROMYCIN 250 MG PO TABS: ORAL_TABLET | ORAL | Status: DC

## 2014-12-09 NOTE — Progress Notes (Signed)
   Subjective:    Patient ID: Jill White, female    DOB: 03-25-1941, 74 y.o.   MRN: [id number]  HPI Here for 2 things. First she has had sinus pressure, PND, and blowing green mucus from the nose for 5 days. Some dry coughing. On Mucinex. Also she continues to have swelling and pain in the ;left ankle. Se saw Dr. Doran Durand last year for this and she had a cortisone shot which helped. However she wishes to see a different orthopedist this time.    Review of Systems  Constitutional: Negative.   HENT: Positive for congestion, postnasal drip and sinus pressure.   Eyes: Negative.   Respiratory: Positive for cough.   Musculoskeletal: Positive for joint swelling and arthralgias.       Objective:   Physical Exam  Constitutional: She appears well-developed and well-nourished.  HENT:  Right Ear: External ear normal.  Left Ear: External ear normal.  Nose: Nose normal.  Mouth/Throat: Oropharynx is clear and moist.  Eyes: Conjunctivae are normal.  Pulmonary/Chest: Effort normal and breath sounds normal.  Musculoskeletal:  Left lateral ankle is swollen and tender. No erythema or warmth  Lymphadenopathy:    She has no cervical adenopathy.          Assessment & Plan:  Treat the sinusitis with a Zpack. Refer to Dr. Ernestina Patches for the ankle pain.

## 2014-12-09 NOTE — Progress Notes (Signed)
Pre visit review using our clinic review tool, if applicable. No additional management support is needed unless otherwise documented below in the visit note. 

## 2014-12-15 ENCOUNTER — Telehealth: Payer: Self-pay | Admitting: Internal Medicine

## 2014-12-15 NOTE — Telephone Encounter (Signed)
Called spoke with pt. She reports her insurance advised her that spiriva and symbicort manufactures have increased the prices of their medications.  The coupons we have are not eligible for medicare patients. Pt aware and she will call her insurance to see what else would be acceptable she can afford

## 2014-12-18 NOTE — Addendum Note (Signed)
Addended by: Alysia Penna A on: 12/18/2014 01:22 PM   Modules accepted: Orders

## 2014-12-29 ENCOUNTER — Other Ambulatory Visit: Payer: Self-pay | Admitting: Family Medicine

## 2014-12-30 NOTE — Telephone Encounter (Signed)
Can we refill this? Drug allergy comes in for this medication.

## 2014-12-31 ENCOUNTER — Telehealth: Payer: Self-pay | Admitting: Internal Medicine

## 2014-12-31 MED ORDER — BUDESONIDE-FORMOTEROL FUMARATE 80-4.5 MCG/ACT IN AERO: 2.0000 | INHALATION_SPRAY | Freq: Two times a day (BID) | RESPIRATORY_TRACT | Status: DC

## 2014-12-31 NOTE — Telephone Encounter (Signed)
RX has been sent in for symbicort. Nothing further needed

## 2015-01-01 ENCOUNTER — Other Ambulatory Visit (INDEPENDENT_AMBULATORY_CARE_PROVIDER_SITE_OTHER): Payer: Medicare Other

## 2015-01-01 ENCOUNTER — Encounter: Payer: Self-pay | Admitting: Family Medicine

## 2015-01-01 ENCOUNTER — Ambulatory Visit (INDEPENDENT_AMBULATORY_CARE_PROVIDER_SITE_OTHER): Payer: Medicare Other | Admitting: Family Medicine

## 2015-01-01 VITALS — BP 134/82 | HR 63 | Ht 61.0 in | Wt 123.0 lb

## 2015-01-01 DIAGNOSIS — S9305XA Dislocation of left ankle joint, initial encounter: Secondary | ICD-10-CM | POA: Diagnosis not present

## 2015-01-01 DIAGNOSIS — M25572 Pain in left ankle and joints of left foot: Secondary | ICD-10-CM | POA: Diagnosis not present

## 2015-01-01 DIAGNOSIS — M24472 Recurrent dislocation, left ankle: Secondary | ICD-10-CM | POA: Insufficient documentation

## 2015-01-01 NOTE — Assessment & Plan Note (Signed)
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 Patient was given an injection today and tolerated the procedure well. We discussed icing regimen, home exercises and patient work with Product/process development scientist today. We discussed topical anti-inflammatory some patient was given a trial. We discussed over-the-counter natural supplementations. Patient and will come back and see me again in 3 weeks for further evaluation and treatment.

## 2015-01-01 NOTE — Progress Notes (Signed)
Pre visit review using our clinic review tool, if applicable. No additional management support is needed unless otherwise documented below in the visit note. 

## 2015-01-01 NOTE — Progress Notes (Signed)
[facility] [address], [place] [address] Phone: [phone] Subjective:    I'm seeing this patient by the request  of:  FRY,STEPHEN A, MD   CC: Left ankle pain  Jill White is a 74 y.o. female coming in with complaint of left ankle pain. Patient states that she has had this pain intermittently for multiple decades. Patient states though over the course of the last 3 months pain is not gone away. Patient states that is on the lateral aspect of the ankle. Does not rib or any specific activity that seemed injury but it did occur. Patient states that this is associated with some swelling. Starting to affect some of her daily activities even walking long distances causes him trouble. Denies any significant pain at night. Denies any numbness or weakness that is associated with it. Rates the severity of pain a 6 out of 10. Patient states changing shoes does not make a difference. She would localize the pain mostly on the inferior aspect of the lateral malleolus as where she points.  Past Medical History  Diagnosis Date  . HIP PAIN   . HYPERTENSION   . Restless leg syndrome   . COPD (chronic obstructive pulmonary disease)     a. Multiple prior admissions for acute hypoxic respiratory failure in setting of COPD exacerbations +/- pneumonia.  . Peptic ulcer disease     a. 2013: gastric antrum.  Marland Kitchen Hyponatremia   . Tobacco abuse   . Hypomagnesemia   . LBBB (left bundle branch block)     a. Intermittent, likely rate related.  . Pulmonary HTN     a. Elevated PA pressure by prior echoes.  . Mitral regurgitation     a. Mild MR by echo 2014, not mentioned on 11/2013 (severely calcified annulus).  Marland Kitchen Respiratory failure     a. Adm 11/2013 with severe hypercarbic and hypoxemic respiratory failure requiring intubation.   . Abnormal echocardiogram     a. 11/2013: decreased EF 45-50%, moderate AS- > subsequent cath 12/02/13 with mildly calcified cors  without obstruction, mild AS, EF 60%, mild pulm HTN.  Marland Kitchen Aortic stenosis     a. 11/2013: mod by echo, then mild by cath.  . Pulmonary HTN     a. 11/2013: mild by cath.  . Wide-complex tachycardia     a. Felt likely atrial tach with rate-related LBBB.  Marland Kitchen Chronic diastolic CHF (congestive heart failure)    Past Surgical History  Procedure Laterality Date  . Abdominal hysterectomy    . Dilation and curettage of uterus    . Tonsillectomy    . Esophagogastroduodenoscopy  07/09/2011    Procedure: ESOPHAGOGASTRODUODENOSCOPY (EGD);  Surgeon: Missy Sabins, MD;  Location: [facility];  Service: Endoscopy;  Laterality: N/A;  patient in room 1532  . Cataract extraction w/ intraocular lens  implant, bilateral Bilateral 03/2013    Patient claims it was within the month.   . Left and right heart catheterization with coronary angiogram N/A 12/02/2013    Procedure: LEFT AND RIGHT HEART CATHETERIZATION WITH CORONARY ANGIOGRAM;  Surgeon: Blane Ohara, MD;  Location: [facility] CATH LAB;  Service: Cardiovascular;  Laterality: N/A;  . Laparoscopic sigmoid colectomy  07-03-14    per Dr. Malachi Carl in [place]    History  Substance Use Topics  . Smoking status: Current Every Day Smoker -- 50 years    Types: Cigarettes    Last Attempt to Quit: 07/01/2014  . Smokeless tobacco:  Never Used     Comment: 1/2 pack per day  . Alcohol Use: No   Allergies  Allergen Reactions  . Tetanus Toxoid Anaphylaxis  . Codeine Nausea And Vomiting and Other (See Comments)    Itching and faint   . Erythromycin Nausea And Vomiting  . Gabapentin     REACTION: severe edema, nausea, dizziness, disorientation  . Meloxicam Itching  . Methylprednisolone Hives  . Penicillins Hives  . Prednisone Other (See Comments)    GI bleed   Family History  Problem Relation Age of Onset  . Heart disease Sister   . Heart disease Brother   . Dementia Mother   . Anemia Father         Past medical history, social, surgical and  family history all reviewed in electronic medical record.   Review of Systems: No headache, visual changes, nausea, vomiting, diarrhea, constipation, dizziness, abdominal pain, skin rash, fevers, chills, night sweats, weight loss, swollen lymph nodes, body aches, joint swelling, muscle aches, chest pain, shortness of breath, mood changes.   Objective Blood pressure 134/82, pulse 63, height 5\' 1"  (1.549 m), weight 123 lb (55.792 kg), SpO2 85 %.  General: No apparent distress alert and oriented x3 mood and affect normal, dressed appropriately.  HEENT: Pupils equal, extraocular movements intact  Respiratory: Patient's speak in full sentences and does not appear short of breath  Cardiovascular: Trace edema of the ankles bilaterally, non tender, no erythema  Skin: Warm dry intact with no signs of infection or rash on extremities or on axial skeleton.  Abdomen: Soft nontender  Neuro: Cranial nerves II through XII are intact, neurovascularly intact in all extremities with 2+ DTRs and 2+ pulses.  Lymph: No lymphadenopathy of posterior or anterior cervical chain or axillae bilaterally.  Gait normal with good balance and coordination.  MSK:  Non tender with full range of motion and good stability and symmetric strength and tone of shoulders, elbows, wrist, hip, knee and bilaterally. Mild osteophytic changes of multiple joints Ankle: Left Trace edema of the ankle Range of motion is full in all directions. 4 out of 5 strength in all directions with mild weakness of the inversion compared to contralateral side otherwise symmetric Stable lateral and medial ligaments; squeeze test and kleiger test unremarkable; Talar dome nontender; No pain at base of 5th MT; No tenderness over cuboid; No tenderness over N spot or navicular prominence No tenderness on posterior aspects of lateral and medial malleolus Positive signs of peroneal subluxation and tenderness Negative tarsal tunnel tinel's Able to walk 4  steps. Contralateral side unremarkable.  MSK US performed of: Left ankle This study was ordered, performed, and interpreted by Charlann Boxer D.O.  Foot/Ankle:   All structures visualized.   Talar dome unremarkable except for mild arthritis Ankle mortise without effusion. Mild to moderate arthritis Peroneus longus and brevis tendons does have some mild hypoechoic changes in patient does have subluxation of the peroneal brevis superficial to the retinaculum Posterior tibialis, flexor hallucis longus, and flexor digitorum longus tendons unremarkable on long and transverse views without sheath effusions. Achilles tendon visualized along length of tendon and unremarkable on long and transverse views without sheath effusion. Anterior Talofibular Ligament and Calcaneofibular Ligaments unremarkable and intact but degenerative changes   IMPRESSION:  Peroneal subluxation with tear of retinaculum  Procedure: Real-time Ultrasound Guided Injection of peroneal tendon sheath of left ankle Device: GE Logiq E  Ultrasound guided injection is preferred based studies that show increased duration, increased effect, greater accuracy, decreased  procedural pain, increased response rate, and decreased cost with ultrasound guided versus blind injection.  Verbal informed consent obtained.  Time-out conducted.  Noted no overlying erythema, induration, or other signs of local infection.  Skin prepped in a sterile fashion.  Local anesthesia: Topical Ethyl chloride.  With sterile technique and under real time ultrasound guidance: With a 25-gauge 20 [place] patient was injected with 0.5 mL of 0.5% Marcaine and 0.5 mL of Kenalog 40 mg/dL in a superior inferior approach on the lateral aspect near the lateral malleolus.  Completed without difficulty  Pain immediately resolved suggesting accurate placement of the medication.  Advised to call if fevers/chills, erythema, induration, drainage, or persistent bleeding.  Images  permanently stored and available for review in the ultrasound unit.  Impression: Technically successful ultrasound guided injection.  Procedure note 17915; 15 minutes spent for Therapeutic exercises as stated in above notes.  This included exercises focusing on stretching, strengthening, with significant focus on eccentric aspects.  Ankle strengthening that included:  Basic range of motion exercises to allow proper full motion at ankle Stretching of the lower leg and hamstrings  Theraband exercises for the lower leg - inversion, eversion, dorsiflexion and plantarflexion each to be completed with a theraband Balance exercises to increase proprioception Weight bearing exercises to increase strength and balance Proper technique shown and discussed handout in great detail with ATC.  All questions were discussed and answered.     Impression and Recommendations:     This case required medical decision making of moderate complexity.

## 2015-01-01 NOTE — Patient Instructions (Signed)
Nice to meet you Ice your ankle at least twice a day, after activity and before bedtime Try Pennsaid, topical anti-inflammatory, on the sore area of your ankle up to 2x/day  Rehab exercises atleast 3x/week Try a heel lift from [facility] - 1/8-1/4 of an inch to start  Iron can help the restless leg Turmeric 500mg  daily See me in 3 weeks if not better

## 2015-01-01 NOTE — Telephone Encounter (Signed)
Yes that is okay  

## 2015-01-22 ENCOUNTER — Ambulatory Visit: Payer: Medicare Other | Admitting: Family Medicine

## 2015-01-26 ENCOUNTER — Ambulatory Visit (INDEPENDENT_AMBULATORY_CARE_PROVIDER_SITE_OTHER)
Admission: RE | Admit: 2015-01-26 | Discharge: 2015-01-26 | Disposition: A | Discharge status: Home / Self Care | Payer: Medicare Other | Source: Ambulatory Visit | Attending: Internal Medicine | Admitting: Internal Medicine

## 2015-01-26 ENCOUNTER — Ambulatory Visit (INDEPENDENT_AMBULATORY_CARE_PROVIDER_SITE_OTHER): Payer: Medicare Other | Admitting: Internal Medicine

## 2015-01-26 ENCOUNTER — Encounter: Payer: Self-pay | Admitting: Internal Medicine

## 2015-01-26 VITALS — BP 124/80 | HR 110 | Temp 101.0°F | Ht 61.0 in | Wt 123.0 lb

## 2015-01-26 DIAGNOSIS — J9612 Chronic respiratory failure with hypercapnia: Secondary | ICD-10-CM

## 2015-01-26 DIAGNOSIS — J441 Chronic obstructive pulmonary disease with (acute) exacerbation: Secondary | ICD-10-CM | POA: Diagnosis not present

## 2015-01-26 DIAGNOSIS — F17210 Nicotine dependence, cigarettes, uncomplicated: Secondary | ICD-10-CM

## 2015-01-26 DIAGNOSIS — Z720 Tobacco use: Secondary | ICD-10-CM

## 2015-01-26 MED ORDER — LEVOFLOXACIN 500 MG PO TABS: 500.0000 mg | ORAL_TABLET | Freq: Every day | ORAL | Status: DC

## 2015-01-26 MED ORDER — FLUTTER DEVI: Status: AC

## 2015-01-26 NOTE — Patient Instructions (Addendum)
Please remember to go to the x-ray department downstairs for your tests - we will call you with the results when they are available.  For cough mucinex up to 1200 mg every 12 hours plus use the flutter valve as much as possible   02 target is 90% ok to adjust sitting walking and supine  Levaquin 500 mg one daily x 10 days   The key is to stop smoking completely before smoking completely stops you!   Please schedule a follow up office visit in 6 weeks, call sooner if needed with Dr Chase Caller   > may need an antibiotic to keep on hand but defer this choice to Dr Chase Caller  Note echo 11/2013  Compared to the study from 2014, LVEF has diminished, there is evidence of hypervolemia and aortic stenosis of unclear severity

## 2015-01-26 NOTE — Progress Notes (Signed)
Subjective:    Patient ID: Jill White, female    DOB: 21-Nov-1940.   MRN: [id number]      OV 09/30/2014/ Ramaswamy  Chief Complaint  Patient presents with  . Follow-up    Pt stated her breathing is unchanged. Pt stated she is doing well. Pt c/o clear prod cough, PND, sius pressure and rhinorrhea d/t pollen. Pt denies CP/tightness.    Follow-up moderate COPD with recurrent exacerbation. Most recent chest x-ray February 2016 is clear without any infiltrates. Last seen through 2016. This is a routine scheduled follow-up. Since her last visit she's been taking whey protein powder which has helped her muscle strength, conditioning and cachexia. She still not interested in pulmonary rehabilitation. She's compliant with her Spiriva and Symbicort. She does not want any change. She's not interested in research trials. She continues to smoke and is not interested in quitting. She only wants follow-up every 6 months. There are no other issues. She still has mild chronic hoarseness of voice which she attributes to inhalers and does not want evaluation rec No change rx     01/26/2015 f/u ov/Wert re: bronchiectasis flare/ fever/ green mucus  Chief Complaint  Patient presents with  . Acute Visit    Pt c/o chest congestion and prod cough with green sputum for the past 2 days. She states that she also feels more SOB than usual and is using proair 2 x per day on average.     Recurrent nausea s vomiting controlled with zofran/ still smoking / better p saba rx  No obvious day to day or daytime variability or assoc chronic cough or cp or chest tightness, subjective wheeze or overt sinus or hb symptoms. No unusual exp hx or h/o childhood pna/ asthma or knowledge of premature birth.  Sleeping ok without nocturnal  or early am exacerbation  of respiratory  c/o's or need for noct saba. Also denies any obvious fluctuation of symptoms with weather or environmental changes or other aggravating or alleviating  factors except as outlined above   Current Medications, Allergies, Complete Past Medical History, Past Surgical History, Family History, and Social History were reviewed in [employer] record.  ROS  The following are not active complaints unless bolded sore throat, dysphagia, dental problems, itching, sneezing,  nasal congestion or excess/ purulent secretions, ear ache,   fever, chills, sweats, unintended wt loss, classically pleuritic or exertional cp, hemoptysis,  orthopnea pnd or leg swelling, presyncope, palpitations, abdominal pain, anorexia, nausea, vomiting, diarrhea  or change in bowel or bladder habits, change in stools or urine, dysuria,hematuria,  rash, arthralgias, visual complaints, headache, numbness, weakness or ataxia or problems with walking or coordination,  change in mood/affect or memory.                    Objective:   Physical Exam  W/c bound pleasant wf  nad on 2lpm   Wt Readings from Last 3 Encounters:  01/26/15 123 lb (55.792 kg)  01/01/15 123 lb (55.792 kg)  12/09/14 118 lb (53.524 kg)    Vital signs reviewed  - elevated temp noted   HENT:  Head: Normocephalic and atraumatic.  Right Ear: External ear normal.  Left Ear: External ear normal.  Mouth/Throat: Oropharynx is clear and moist. No oropharyngeal exudate.  Eyes: Conjunctivae and EOM are normal. Pupils are equal, round, and reactive to light. Right eye exhibits no discharge. Left eye exhibits no discharge. No scleral icterus.  Neck: Normal range of motion.  Neck supple. No JVD present. No tracheal deviation present. No thyromegaly present.  Cardiovascular: Normal rate, regular rhythm, normal heart sounds and intact distal pulses.  Exam reveals no gallop and no friction rub.   II/VI sys  murmur   Pulmonary/Chest: insp and exp somewhat junky sounding rhonchi bilaterally Abdominal: Soft. Bowel sounds are normal. She exhibits no distension and no mass. There is no tenderness.  There is no rebound and no guarding.   Musculoskeletal: Normal range of motion. She exhibits no edema or tenderness.  Lymphadenopathy:    She has no cervical adenopathy.  Neurological: She is alert and oriented to person, place, and time. She has normal reflexes. No cranial nerve deficit. She exhibits normal muscle tone. Coordination normal.  Skin: Skin is warm and dry. No rash noted. She is not diaphoretic. No erythema. No pallor.  Psychiatric: She has a normal mood and affect. Her behavior is normal. Judgment and thought content normal.       I personally reviewed images and agree with radiology impression as follows:  CXR:  01/26/15 Cardiomegaly with diffuse bilateral pulmonary interstitial prominence and small pleural effusions suggesting congestive heart failure. Pneumonitis cannot be excluded.      Assessment & Plan:

## 2015-01-26 NOTE — Progress Notes (Signed)
Quick Note:  Spoke with pt and notified of results per Dr. Wert. Pt verbalized understanding and denied any questions.  ______ 

## 2015-01-28 ENCOUNTER — Telehealth: Payer: Self-pay | Admitting: Internal Medicine

## 2015-01-28 MED ORDER — CEFDINIR 300 MG PO CAPS: 300.0000 mg | ORAL_CAPSULE | Freq: Two times a day (BID) | ORAL | Status: DC

## 2015-01-28 NOTE — Telephone Encounter (Signed)
Sorry to hear that but def needs to stop the levaquin and list it with her allergies  Since allergy to pcn only hives rec omnicef 300 mg bid x 10 days but d/c if any itching at all

## 2015-01-28 NOTE — Telephone Encounter (Signed)
Spoke with Jill White and notified of recs per MW  She verbalized understanding  Levaquin added to allergy list and cipro sent to pharm

## 2015-01-28 NOTE — Telephone Encounter (Signed)
Per 01/26/15 OV: Levaquin 500 mg one daily x 10 days --  Called spoke w/ daughter. She reports pt is feeling very nauseated and tendon in foot feels swollen and having hard time walking. Wants ABX changed. Please advise thanks

## 2015-01-30 ENCOUNTER — Telehealth: Payer: Self-pay | Admitting: Family Medicine

## 2015-01-30 MED ORDER — CLONAZEPAM 0.5 MG PO TABS: ORAL_TABLET | ORAL | Status: DC

## 2015-01-30 NOTE — Telephone Encounter (Signed)
Refill request for Clonazepam 0.5 mg take 2 tablets qd and send to [facility]. Pt was last here in office on 12/09/14 and is due for refill.

## 2015-01-30 NOTE — Telephone Encounter (Signed)
OK to refill for one month 

## 2015-01-30 NOTE — Telephone Encounter (Signed)
I called in script 

## 2015-02-01 ENCOUNTER — Emergency Department (HOSPITAL_COMMUNITY): Payer: Medicare Other

## 2015-02-01 ENCOUNTER — Encounter (HOSPITAL_COMMUNITY): Payer: Self-pay | Admitting: Emergency Medicine

## 2015-02-01 ENCOUNTER — Inpatient Hospital Stay (HOSPITAL_COMMUNITY)
Admission: EM | Admit: 2015-02-01 | Discharge: 2015-02-06 | DRG: 190 | Disposition: A | Discharge status: Home / Self Care | Payer: Medicare Other | Attending: Internal Medicine | Admitting: Internal Medicine

## 2015-02-01 ENCOUNTER — Encounter: Payer: Self-pay | Admitting: Internal Medicine

## 2015-02-01 DIAGNOSIS — R609 Edema, unspecified: Secondary | ICD-10-CM

## 2015-02-01 DIAGNOSIS — G2581 Restless legs syndrome: Secondary | ICD-10-CM | POA: Diagnosis present

## 2015-02-01 DIAGNOSIS — I080 Rheumatic disorders of both mitral and aortic valves: Secondary | ICD-10-CM | POA: Diagnosis present

## 2015-02-01 DIAGNOSIS — E871 Hypo-osmolality and hyponatremia: Secondary | ICD-10-CM | POA: Diagnosis present

## 2015-02-01 DIAGNOSIS — Z881 Allergy status to other antibiotic agents status: Secondary | ICD-10-CM

## 2015-02-01 DIAGNOSIS — J9612 Chronic respiratory failure with hypercapnia: Secondary | ICD-10-CM | POA: Diagnosis present

## 2015-02-01 DIAGNOSIS — Z7982 Long term (current) use of aspirin: Secondary | ICD-10-CM

## 2015-02-01 DIAGNOSIS — Z8249 Family history of ischemic heart disease and other diseases of the circulatory system: Secondary | ICD-10-CM | POA: Diagnosis not present

## 2015-02-01 DIAGNOSIS — I5032 Chronic diastolic (congestive) heart failure: Secondary | ICD-10-CM | POA: Diagnosis present

## 2015-02-01 DIAGNOSIS — I10 Essential (primary) hypertension: Secondary | ICD-10-CM | POA: Diagnosis present

## 2015-02-01 DIAGNOSIS — Z880 Allergy status to penicillin: Secondary | ICD-10-CM

## 2015-02-01 DIAGNOSIS — I272 Other secondary pulmonary hypertension: Secondary | ICD-10-CM | POA: Diagnosis present

## 2015-02-01 DIAGNOSIS — J9690 Respiratory failure, unspecified, unspecified whether with hypoxia or hypercapnia: Secondary | ICD-10-CM

## 2015-02-01 DIAGNOSIS — I5042 Chronic combined systolic (congestive) and diastolic (congestive) heart failure: Secondary | ICD-10-CM | POA: Diagnosis present

## 2015-02-01 DIAGNOSIS — E860 Dehydration: Secondary | ICD-10-CM | POA: Diagnosis present

## 2015-02-01 DIAGNOSIS — Z888 Allergy status to other drugs, medicaments and biological substances status: Secondary | ICD-10-CM | POA: Diagnosis not present

## 2015-02-01 DIAGNOSIS — R0600 Dyspnea, unspecified: Secondary | ICD-10-CM

## 2015-02-01 DIAGNOSIS — J441 Chronic obstructive pulmonary disease with (acute) exacerbation: Secondary | ICD-10-CM | POA: Diagnosis not present

## 2015-02-01 DIAGNOSIS — I5040 Unspecified combined systolic (congestive) and diastolic (congestive) heart failure: Secondary | ICD-10-CM | POA: Diagnosis present

## 2015-02-01 DIAGNOSIS — J189 Pneumonia, unspecified organism: Secondary | ICD-10-CM | POA: Diagnosis not present

## 2015-02-01 DIAGNOSIS — R0602 Shortness of breath: Secondary | ICD-10-CM | POA: Diagnosis present

## 2015-02-01 DIAGNOSIS — J90 Pleural effusion, not elsewhere classified: Secondary | ICD-10-CM

## 2015-02-01 DIAGNOSIS — Z9981 Dependence on supplemental oxygen: Secondary | ICD-10-CM | POA: Diagnosis not present

## 2015-02-01 DIAGNOSIS — F17210 Nicotine dependence, cigarettes, uncomplicated: Secondary | ICD-10-CM | POA: Diagnosis present

## 2015-02-01 HISTORY — DX: Sepsis, unspecified organism: A41.9

## 2015-02-01 HISTORY — DX: Diverticulitis of large intestine without perforation or abscess without bleeding: K57.32

## 2015-02-01 HISTORY — DX: Severe sepsis with septic shock: R65.21

## 2015-02-01 HISTORY — DX: Acute on chronic combined systolic (congestive) and diastolic (congestive) heart failure: I50.43

## 2015-02-01 LAB — CBC WITH DIFFERENTIAL/PLATELET
BASOS PCT: 0 % (ref 0–1)
Basophils Absolute: 0 10*3/uL (ref 0.0–0.1)
EOS ABS: 0.1 10*3/uL (ref 0.0–0.7)
Eosinophils Relative: 2 % (ref 0–5)
HEMATOCRIT: 35.2 % — AB (ref 36.0–46.0)
Hemoglobin: 11.9 g/dL — ABNORMAL LOW (ref 12.0–15.0)
Lymphocytes Relative: 26 % (ref 12–46)
Lymphs Abs: 1.9 10*3/uL (ref 0.7–4.0)
MCH: 31.2 pg (ref 26.0–34.0)
MCHC: 33.8 g/dL (ref 30.0–36.0)
MCV: 92.1 fL (ref 78.0–100.0)
MONO ABS: 0.9 10*3/uL (ref 0.1–1.0)
Monocytes Relative: 12 % (ref 3–12)
Neutro Abs: 4.4 10*3/uL (ref 1.7–7.7)
Neutrophils Relative %: 60 % (ref 43–77)
PLATELETS: 431 10*3/uL — AB (ref 150–400)
RBC: 3.82 MIL/uL — ABNORMAL LOW (ref 3.87–5.11)
RDW: 12.7 % (ref 11.5–15.5)
WBC: 7.3 10*3/uL (ref 4.0–10.5)

## 2015-02-01 LAB — BASIC METABOLIC PANEL
ANION GAP: 9 (ref 5–15)
BUN: 5 mg/dL — AB (ref 6–20)
CHLORIDE: 79 mmol/L — AB (ref 101–111)
CO2: 35 mmol/L — ABNORMAL HIGH (ref 22–32)
Calcium: 9.2 mg/dL (ref 8.9–10.3)
Creatinine, Ser: 0.56 mg/dL (ref 0.44–1.00)
GFR calc Af Amer: >60 mL/min (ref >60)
GFR calc non Af Amer: >60 mL/min (ref >60)
Glucose, Bld: 109 mg/dL — ABNORMAL HIGH (ref 65–99)
Potassium: 4.4 mmol/L (ref 3.5–5.1)
SODIUM: 123 mmol/L — AB (ref 135–145)

## 2015-02-01 LAB — I-STAT TROPONIN, ED: TROPONIN I, POC: 0 ng/mL (ref 0.00–0.08)

## 2015-02-01 LAB — TROPONIN I

## 2015-02-01 MED ORDER — TRAMADOL HCL 50 MG PO TABS
100.0000 mg | ORAL_TABLET | Freq: Four times a day (QID) | ORAL | Status: DC | PRN
  Administered 2015-02-02 – 2015-02-05 (×8): 100 mg via ORAL
  Filled 2015-02-01 (×8): qty 2

## 2015-02-01 MED ORDER — ASPIRIN 81 MG PO CHEW
81.0000 mg | CHEWABLE_TABLET | Freq: Every morning | ORAL | Status: DC
  Administered 2015-02-02 – 2015-02-06 (×5): 81 mg via ORAL
  Filled 2015-02-01 (×5): qty 1

## 2015-02-01 MED ORDER — ONDANSETRON HCL 4 MG PO TABS
4.0000 mg | ORAL_TABLET | ORAL | Status: DC | PRN
  Administered 2015-02-02 – 2015-02-04 (×3): 4 mg via ORAL
  Filled 2015-02-01 (×3): qty 1

## 2015-02-01 MED ORDER — ALBUTEROL SULFATE (2.5 MG/3ML) 0.083% IN NEBU
2.5000 mg | INHALATION_SOLUTION | RESPIRATORY_TRACT | Status: DC | PRN
  Administered 2015-02-02: 2.5 mg via RESPIRATORY_TRACT
  Filled 2015-02-01: qty 3

## 2015-02-01 MED ORDER — GUAIFENESIN ER 600 MG PO TB12
600.0000 mg | ORAL_TABLET | Freq: Two times a day (BID) | ORAL | Status: DC
  Administered 2015-02-01 – 2015-02-06 (×10): 600 mg via ORAL
  Filled 2015-02-01 (×12): qty 1

## 2015-02-01 MED ORDER — BUDESONIDE-FORMOTEROL FUMARATE 80-4.5 MCG/ACT IN AERO
2.0000 | INHALATION_SPRAY | Freq: Two times a day (BID) | RESPIRATORY_TRACT | Status: DC
  Administered 2015-02-02 – 2015-02-06 (×10): 2 via RESPIRATORY_TRACT
  Filled 2015-02-01: qty 6.9

## 2015-02-01 MED ORDER — ENOXAPARIN SODIUM 40 MG/0.4ML ~~LOC~~ SOLN
40.0000 mg | Freq: Every day | SUBCUTANEOUS | Status: DC
  Administered 2015-02-01 – 2015-02-05 (×5): 40 mg via SUBCUTANEOUS
  Filled 2015-02-01 (×6): qty 0.4

## 2015-02-01 MED ORDER — TIOTROPIUM BROMIDE MONOHYDRATE 18 MCG IN CAPS
18.0000 ug | ORAL_CAPSULE | Freq: Every day | RESPIRATORY_TRACT | Status: DC
  Administered 2015-02-02 – 2015-02-06 (×5): 18 ug via RESPIRATORY_TRACT
  Filled 2015-02-01 (×2): qty 5

## 2015-02-01 MED ORDER — LEVOFLOXACIN IN D5W 250 MG/50ML IV SOLN: 250.0000 mg | INTRAVENOUS | Status: DC

## 2015-02-01 MED ORDER — IPRATROPIUM-ALBUTEROL 0.5-2.5 (3) MG/3ML IN SOLN
3.0000 mL | Freq: Once | RESPIRATORY_TRACT | Status: AC
  Administered 2015-02-01: 3 mL via RESPIRATORY_TRACT
  Filled 2015-02-01: qty 3

## 2015-02-01 MED ORDER — SODIUM CHLORIDE 0.9 % IV BOLUS (SEPSIS)
500.0000 mL | Freq: Once | INTRAVENOUS | Status: AC
  Administered 2015-02-01: 500 mL via INTRAVENOUS

## 2015-02-01 MED ORDER — SODIUM CHLORIDE 0.9 % IV SOLN
INTRAVENOUS | Status: DC
  Administered 2015-02-01 – 2015-02-03 (×2): via INTRAVENOUS

## 2015-02-01 MED ORDER — LEVOFLOXACIN IN D5W 500 MG/100ML IV SOLN
500.0000 mg | Freq: Once | INTRAVENOUS | Status: AC
  Administered 2015-02-01: 500 mg via INTRAVENOUS
  Filled 2015-02-01: qty 100

## 2015-02-01 MED ORDER — ALPRAZOLAM 0.25 MG PO TABS
0.2500 mg | ORAL_TABLET | Freq: Every evening | ORAL | Status: DC | PRN
  Administered 2015-02-02: 0.25 mg via ORAL
  Filled 2015-02-01: qty 1

## 2015-02-01 MED ORDER — LOSARTAN POTASSIUM 50 MG PO TABS
50.0000 mg | ORAL_TABLET | Freq: Every day | ORAL | Status: DC
  Administered 2015-02-02 – 2015-02-06 (×5): 50 mg via ORAL
  Filled 2015-02-01 (×5): qty 1

## 2015-02-01 MED ORDER — ALBUTEROL SULFATE HFA 108 (90 BASE) MCG/ACT IN AERS: 1.0000 | INHALATION_SPRAY | RESPIRATORY_TRACT | Status: DC | PRN

## 2015-02-01 MED ORDER — CLONAZEPAM 1 MG PO TABS
1.0000 mg | ORAL_TABLET | Freq: Every day | ORAL | Status: DC
  Filled 2015-02-01: qty 2

## 2015-02-01 MED ORDER — CLONAZEPAM 1 MG PO TABS
1.0000 mg | ORAL_TABLET | Freq: Every day | ORAL | Status: DC
  Administered 2015-02-01 – 2015-02-03 (×3): 1 mg via ORAL
  Filled 2015-02-01 (×2): qty 1

## 2015-02-01 MED ORDER — ONDANSETRON HCL 4 MG/2ML IJ SOLN
4.0000 mg | Freq: Once | INTRAMUSCULAR | Status: AC
  Administered 2015-02-01: 4 mg via INTRAVENOUS
  Filled 2015-02-01: qty 2

## 2015-02-01 MED ORDER — MAGNESIUM OXIDE 400 (241.3 MG) MG PO TABS
400.0000 mg | ORAL_TABLET | Freq: Every day | ORAL | Status: DC
  Administered 2015-02-02 – 2015-02-06 (×5): 400 mg via ORAL
  Filled 2015-02-01 (×5): qty 1

## 2015-02-01 MED ORDER — DILTIAZEM HCL ER COATED BEADS 120 MG PO CP24
120.0000 mg | ORAL_CAPSULE | Freq: Every day | ORAL | Status: DC
  Administered 2015-02-02 – 2015-02-06 (×5): 120 mg via ORAL
  Filled 2015-02-01 (×5): qty 1

## 2015-02-01 NOTE — ED Notes (Signed)
Pt has a  Hx of COPD and saw [place] pulmonology Monday after having productive cough and SOB.Pt took levaquin, had a reaction and was changed to cefdinir which she started Thursday Morning. Alert and oriented.

## 2015-02-01 NOTE — Assessment & Plan Note (Signed)

## 2015-02-01 NOTE — ED Notes (Signed)
Patient in bathroom urinating

## 2015-02-01 NOTE — H&P (Signed)
[facility] History and Physical  Jill White YKD:[id number] DOB: [birth year] DOA: 02/01/2015  Referring physician: EDP PCP: Laurey Morale, MD   Chief Complaint: SOB   HPI: Jill White is a 74 y.o. female with h/o COPD, on O2 at home on a PRN basis.  Seen by Dr. Melvyn Novas on 8/8 for cough, SOB, fever of 101 in office, wheezing.  Patients cough was productive of greenish sputum at that time.  Patient put on PO levaquin as outpatient.  A couple of days later her sputum had turned clear and symptoms improved, but she developed joint aches and so levaquin was switched to omnicef.  After going on omnicef she again developed greenish sputum and worsening SOB over the past couple of days prompting her to present to the ED tonight.  Review of Systems: Systems reviewed.  As above, otherwise negative  Past Medical History  Diagnosis Date  . HIP PAIN   . HYPERTENSION   . Restless leg syndrome   . COPD (chronic obstructive pulmonary disease)     a. Multiple prior admissions for acute hypoxic respiratory failure in setting of COPD exacerbations +/- pneumonia.  . Peptic ulcer disease     a. 2013: gastric antrum.  Marland Kitchen Hyponatremia   . Tobacco abuse   . Hypomagnesemia   . LBBB (left bundle branch block)     a. Intermittent, likely rate related.  . Pulmonary HTN     a. Elevated PA pressure by prior echoes.  . Mitral regurgitation     a. Mild MR by echo 2014, not mentioned on 11/2013 (severely calcified annulus).  Marland Kitchen Respiratory failure     a. Adm 11/2013 with severe hypercarbic and hypoxemic respiratory failure requiring intubation.   . Abnormal echocardiogram     a. 11/2013: decreased EF 45-50%, moderate AS- > subsequent cath 12/02/13 with mildly calcified cors without obstruction, mild AS, EF 60%, mild pulm HTN.  Marland Kitchen Aortic stenosis     a. 11/2013: mod by echo, then mild by cath.  . Pulmonary HTN     a. 11/2013: mild by cath.  . Wide-complex tachycardia     a. Felt likely atrial tach with  rate-related LBBB.  Marland Kitchen Chronic diastolic CHF (congestive heart failure)    Past Surgical History  Procedure Laterality Date  . Abdominal hysterectomy    . Dilation and curettage of uterus    . Tonsillectomy    . Esophagogastroduodenoscopy  07/09/2011    Procedure: ESOPHAGOGASTRODUODENOSCOPY (EGD);  Surgeon: Missy Sabins, MD;  Location: [facility];  Service: Endoscopy;  Laterality: N/A;  patient in room 1532  . Cataract extraction w/ intraocular lens  implant, bilateral Bilateral 03/2013    Patient claims it was within the month.   . Left and right heart catheterization with coronary angiogram N/A 12/02/2013    Procedure: LEFT AND RIGHT HEART CATHETERIZATION WITH CORONARY ANGIOGRAM;  Surgeon: Blane Ohara, MD;  Location: [facility] CATH LAB;  Service: Cardiovascular;  Laterality: N/A;  . Laparoscopic sigmoid colectomy  07-03-14    per Dr. Malachi Carl in [place] History:  reports that she has been smoking Cigarettes.  She has smoked for the past 50 years. She has never used smokeless tobacco. She reports that she does not drink alcohol or use illicit drugs.  Allergies  Allergen Reactions  . Tetanus Toxoid Anaphylaxis  . Codeine Nausea And Vomiting and Other (See Comments)    Itching and faint   . Erythromycin Nausea And Vomiting  .  Gabapentin     REACTION: severe edema, nausea, dizziness, disorientation  . Levaquin [Levofloxacin In D5w] Other (See Comments)    Joint pain; (states she can take it IV- not by mouth)  . Meloxicam Itching  . Methylprednisolone Hives  . Penicillins Hives  . Prednisone Other (See Comments)    GI bleed    Family History  Problem Relation Age of Onset  . Heart disease Sister   . Heart disease Brother   . Dementia Mother   . Anemia Father      Prior to Admission medications   Medication Sig Start Date End Date Taking? Authorizing Provider  ALPRAZolam Duanne Moron) 0.25 MG tablet Take 0.25 mg by mouth at bedtime as needed for anxiety.   Yes  Historical Provider, MD  aspirin 81 MG tablet Take 81 mg by mouth every morning.    Yes Historical Provider, MD  budesonide-formoterol (SYMBICORT) 80-4.5 MCG/ACT inhaler Inhale 2 puffs into the lungs 2 (two) times daily. 12/31/14  Yes Brand Males, MD  cefdinir (OMNICEF) 300 MG capsule Take 1 capsule (300 mg total) by mouth 2 (two) times daily. 01/28/15  Yes Tanda Rockers, MD  clonazePAM (KLONOPIN) 0.5 MG tablet TAKE TWO TABLET BY MOUTH EVERY AT BEDTIME 01/30/15  Yes Laurey Morale, MD  diltiazem (CARDIZEM CD) 120 MG 24 hr capsule Take 1 capsule (120 mg total) by mouth daily. 12/03/13  Yes Erick Colace, NP  furosemide (LASIX) 20 MG tablet Take 1.5 tablets (30 mg total) by mouth daily. 10/14/14  Yes Fay Records, MD  guaiFENesin (MUCINEX) 600 MG 12 hr tablet Take 600 mg by mouth 2 (two) times daily.   Yes Historical Provider, MD  losartan (COZAAR) 50 MG tablet Take 1 tablet (50 mg total) by mouth daily. 03/06/14  Yes Fay Records, MD  magnesium oxide (MAG-OX) 400 MG tablet Take 1 tablet (400 mg total) by mouth daily. 07/31/14  Yes Scott T Kathlen Mody, PA-C  ondansetron (ZOFRAN) 4 MG tablet Take 4 mg by mouth every 4 (four) hours as needed for nausea or vomiting.   Yes Historical Provider, MD  potassium chloride (K-DUR) 10 MEQ tablet Take 1 tablet (10 mEq total) by mouth daily. 11/26/14  Yes Fay Records, MD  PROAIR HFA 108 (90 BASE) MCG/ACT inhaler INHALE TWO PUFFS BY MOUTH EVERY FOUR HOURS AS NEEDED Patient taking differently: INHALE TWO PUFFS BY MOUTH EVERY FOUR HOURS AS NEEDED shortness of breath/wheezing 10/31/14  Yes Laurey Morale, MD  Respiratory Therapy Supplies (FLUTTER) DEVI Use as directed 01/26/15  Yes Tanda Rockers, MD  tiotropium (SPIRIVA) 18 MCG inhalation capsule Place 1 capsule (18 mcg total) into inhaler and inhale daily. 12/03/13  Yes Erick Colace, NP  TraMADol HCl 50 MG TBDP Take 1 tablet by mouth every 6 (six) hours as needed. Patient taking differently: Take 2 tablets by mouth every 6  (six) hours as needed (pain).  05/22/14  Yes Barton Dubois, MD   Physical Exam: Filed Vitals:   02/01/15 2010  BP: 138/55  Pulse: 84  Temp:   Resp: 18    BP 138/55 mmHg  Pulse 84  Temp(Src) 98.3 F (36.8 C) (Oral)  Resp 18  SpO2 96%  General Appearance:    Alert, oriented, no distress, appears stated age  Head:    Normocephalic, atraumatic  Eyes:    PERRL, EOMI, sclera non-icteric        Nose:   Nares without drainage or epistaxis. Mucosa, turbinates normal  Throat:  Moist mucous membranes. Oropharynx without erythema or exudate.  Neck:   Supple. No carotid bruits.  No thyromegaly.  No lymphadenopathy.   Back:     No CVA tenderness, no spinal tenderness  Lungs:     Faint wheezes bilaterally.  Chest wall:    No tenderness to palpitation  Heart:    Regular rate and rhythm without murmurs, gallops, rubs  Abdomen:     Soft, non-tender, nondistended, normal bowel sounds, no organomegaly  Genitalia:    deferred  Rectal:    deferred  Extremities:   No clubbing, cyanosis or edema.  Pulses:   2+ and symmetric all extremities  Skin:   Skin color, texture, turgor normal, no rashes or lesions  Lymph nodes:   Cervical, supraclavicular, and axillary nodes normal  Neurologic:   CNII-XII intact. Normal strength, sensation and reflexes      throughout    Labs on Admission:  Basic Metabolic Panel:  Recent Labs Lab 02/01/15 2010  NA 123*  K 4.4  CL 79*  CO2 35*  GLUCOSE 109*  BUN 5*  CREATININE 0.56  CALCIUM 9.2   Liver Function Tests: No results for input(s): AST, ALT, ALKPHOS, BILITOT, PROT, ALBUMIN in the last 168 hours. No results for input(s): LIPASE, AMYLASE in the last 168 hours. No results for input(s): AMMONIA in the last 168 hours. CBC:  Recent Labs Lab 02/01/15 2010  WBC 7.3  NEUTROABS 4.4  HGB 11.9*  HCT 35.2*  MCV 92.1  PLT 431*   Cardiac Enzymes:  Recent Labs Lab 02/01/15 2010  TROPONINI <0.03    BNP (last 3 results)  Recent Labs   03/26/14 1005 05/12/14 1316 05/14/14 0533  PROBNP 94.0 577.4* 1351.0*   CBG: No results for input(s): GLUCAP in the last 168 hours.  Radiological Exams on Admission: Dg Chest 2 View  02/01/2015   CLINICAL DATA:  Productive cough.  COPD.  Shortness of breath.  EXAM: CHEST  2 VIEW  COMPARISON:  01/26/2015 and 08/02/2014  FINDINGS: There is a new small right pleural effusion. There is slight pulmonary vascular prominence. Severe COPD. Old compression fracture in the upper thoracic spine.  Extensive calcification in the thoracic aorta and mitral valve annulus. No consolidative infiltrates.  IMPRESSION: 1. New small right pleural effusion. 2. Severe COPD. 3. Slight increased pulmonary vascularity since the prior study.   Electronically Signed   By: Lorriane Shire M.D.   On: 02/01/2015 19:59    EKG: Independently reviewed.  Assessment/Plan Principal Problem:   COPD exacerbation Active Problems:   Dehydration with hyponatremia   CAP (community acquired pneumonia)   Chronic diastolic CHF (congestive heart failure)   Chronic respiratory failure with hypercapnia   1. COPD exacerbation with possible CAP - 1. PNA pathway 2. Cultures pending 3. Got levaquin IV in ED, if she tolerates this will go ahead and have pharmacy dose IV levaquin, watch for development of side effects. 1. Discussed risk of tendon rupture with daughter and patient but they wish to proceed 2. Has tolerated levaquin in the past, most recently last summer during admission. 4. Failed omnicef, also failed course of azithromycin before seeing Dr. Melvyn Novas as well she says. 5. Adult wheeze protocol 6. Will hold off on steroids for now due to listed allergies to these. 2. Dehydration with hyponatremia - 1. IVF bolus in ED and continuous IVF 2. Repeat BMP in AM 3. Chronic CHF - currently dry 1. Holding lasix 2. Gentle hydration    Code Status: Full Code  Family Communication: Family at bedside Disposition Plan: Admit to  inpatient   Time spent: 55 min  GARDNER, JARED M. [facility] Pager [phone]  If 7AM-7PM, please contact the day team taking care of the patient [email or web address] Password [facility] 02/01/2015, 10:46 PM

## 2015-02-01 NOTE — Assessment & Plan Note (Addendum)
PFT's  01/23/14  FEV1 1.19 (61 % ) ratio 56  p no % improvement from saba with DLCO  37 % corrects to 48 % for alv volume    Recurrent aecopds ? Etiology in pt with GOLD II severity  DDX of  difficult airways management all start with A and  include Adherence, Ace Inhibitors, Acid Reflux, Active Sinus Disease, Alpha 1 Antitripsin deficiency, Anxiety masquerading as Airways dz,  ABPA,  allergy(esp in young), Aspiration (esp in elderly), Adverse effects of meds,  Active smokers, A bunch of PE's (a small clot burden can't cause this syndrome unless there is already severe underlying pulm or vascular dz with poor reserve) plus two Bs  = Bronchiectasis and Beta blocker use..and one C= CHF   Adherence is always the initial "prime suspect" and is a multilayered concern that requires a "trust but verify" approach in every patient - starting with knowing how to use medications, especially inhalers, correctly, keeping up with refills and understanding the fundamental difference between maintenance and prns vs those medications only taken for a very short course and then stopped and not refilled.  The proper method of use, as well as anticipated side effects, of a metered-dose inhaler are discussed and demonstrated to the patient. Improved effectiveness after extensive coaching during this visit to a level of approximately  75% so no change maint rx  Active smoking > see sep a/p  ? chf Echo  11/23/13  - Compared to the study from 2014, LVEF has diminished, there is evidence of hypervolemia and aortic stenosis of unclear severity> needs f/u this year > defer to Dr Harrington Challenger   I had an extended discussion with the patient reviewing all relevant studies completed to date and  lasting 15 to 20 minutes of a 25 minute visit    Each maintenance medication was reviewed in detail including most importantly the difference between maintenance and prns and under what circumstances the prns are to be triggered using an action  plan format that is not reflected in the computer generated alphabetically organized AVS.    Please see instructions for details which were reviewed in writing and the patient given a copy highlighting the part that I personally wrote and discussed at today's ov.

## 2015-02-01 NOTE — ED Provider Notes (Signed)
CSN: [id number]     Arrival date & time 02/01/15  1919 History   First MD Initiated Contact with Patient 02/01/15 1943     Chief Complaint  Patient presents with  . Shortness of Breath  . Nausea     (Consider location/radiation/quality/duration/timing/severity/associated sxs/prior Treatment) HPI.... Patient has known history of COPD. She is on oxygen on an as-needed basis. She complains of a significant upper respiratory infection for the past 9 days. She was seen by Dr. Melvyn Novas on Monday and put on oral Levaquin. This did not agree with her. On Wednesday, medication was changed to Cefdinir 30 mg po bid.  She now continues to complain of shortness of breath, cough, congestion, nausea.  Past Medical History  Diagnosis Date  . HIP PAIN   . HYPERTENSION   . Restless leg syndrome   . COPD (chronic obstructive pulmonary disease)     a. Multiple prior admissions for acute hypoxic respiratory failure in setting of COPD exacerbations +/- pneumonia.  . Peptic ulcer disease     a. 2013: gastric antrum.  Marland Kitchen Hyponatremia   . Tobacco abuse   . Hypomagnesemia   . LBBB (left bundle branch block)     a. Intermittent, likely rate related.  . Pulmonary HTN     a. Elevated PA pressure by prior echoes.  . Mitral regurgitation     a. Mild MR by echo 2014, not mentioned on 11/2013 (severely calcified annulus).  Marland Kitchen Respiratory failure     a. Adm 11/2013 with severe hypercarbic and hypoxemic respiratory failure requiring intubation.   . Abnormal echocardiogram     a. 11/2013: decreased EF 45-50%, moderate AS- > subsequent cath 12/02/13 with mildly calcified cors without obstruction, mild AS, EF 60%, mild pulm HTN.  Marland Kitchen Aortic stenosis     a. 11/2013: mod by echo, then mild by cath.  . Pulmonary HTN     a. 11/2013: mild by cath.  . Wide-complex tachycardia     a. Felt likely atrial tach with rate-related LBBB.  Marland Kitchen Chronic diastolic CHF (congestive heart failure)    Past Surgical History  Procedure Laterality  Date  . Abdominal hysterectomy    . Dilation and curettage of uterus    . Tonsillectomy    . Esophagogastroduodenoscopy  07/09/2011    Procedure: ESOPHAGOGASTRODUODENOSCOPY (EGD);  Surgeon: Missy Sabins, MD;  Location: [facility];  Service: Endoscopy;  Laterality: N/A;  patient in room 1532  . Cataract extraction w/ intraocular lens  implant, bilateral Bilateral 03/2013    Patient claims it was within the month.   . Left and right heart catheterization with coronary angiogram N/A 12/02/2013    Procedure: LEFT AND RIGHT HEART CATHETERIZATION WITH CORONARY ANGIOGRAM;  Surgeon: Blane Ohara, MD;  Location: [facility] CATH LAB;  Service: Cardiovascular;  Laterality: N/A;  . Laparoscopic sigmoid colectomy  07-03-14    per Dr. Malachi Carl in [place]    Family History  Problem Relation Age of Onset  . Heart disease Sister   . Heart disease Brother   . Dementia Mother   . Anemia Father    Social History  Substance Use Topics  . Smoking status: Current Every Day Smoker -- 50 years    Types: Cigarettes  . Smokeless tobacco: Never Used     Comment: 1/2 pack per day  . Alcohol Use: No   OB History    No data available     Review of Systems  All other systems reviewed and are negative.  Allergies  Tetanus toxoid; Codeine; Erythromycin; Gabapentin; Levaquin; Meloxicam; Methylprednisolone; Penicillins; and Prednisone  Home Medications   Prior to Admission medications   Medication Sig Start Date End Date Taking? Authorizing Provider  ALPRAZolam Duanne Moron) 0.25 MG tablet Take 0.25 mg by mouth at bedtime as needed for anxiety.   Yes Historical Provider, MD  aspirin 81 MG tablet Take 81 mg by mouth every morning.    Yes Historical Provider, MD  budesonide-formoterol (SYMBICORT) 80-4.5 MCG/ACT inhaler Inhale 2 puffs into the lungs 2 (two) times daily. 12/31/14  Yes Brand Males, MD  cefdinir (OMNICEF) 300 MG capsule Take 1 capsule (300 mg total) by mouth 2 (two) times daily. 01/28/15   Yes Tanda Rockers, MD  clonazePAM (KLONOPIN) 0.5 MG tablet TAKE TWO TABLET BY MOUTH EVERY AT BEDTIME 01/30/15  Yes Laurey Morale, MD  diltiazem (CARDIZEM CD) 120 MG 24 hr capsule Take 1 capsule (120 mg total) by mouth daily. 12/03/13  Yes Erick Colace, NP  furosemide (LASIX) 20 MG tablet Take 1.5 tablets (30 mg total) by mouth daily. 10/14/14  Yes Fay Records, MD  guaiFENesin (MUCINEX) 600 MG 12 hr tablet Take 600 mg by mouth 2 (two) times daily.   Yes Historical Provider, MD  losartan (COZAAR) 50 MG tablet Take 1 tablet (50 mg total) by mouth daily. 03/06/14  Yes Fay Records, MD  magnesium oxide (MAG-OX) 400 MG tablet Take 1 tablet (400 mg total) by mouth daily. 07/31/14  Yes Scott T Kathlen Mody, PA-C  ondansetron (ZOFRAN) 4 MG tablet Take 4 mg by mouth every 4 (four) hours as needed for nausea or vomiting.   Yes Historical Provider, MD  potassium chloride (K-DUR) 10 MEQ tablet Take 1 tablet (10 mEq total) by mouth daily. 11/26/14  Yes Fay Records, MD  PROAIR HFA 108 (90 BASE) MCG/ACT inhaler INHALE TWO PUFFS BY MOUTH EVERY FOUR HOURS AS NEEDED Patient taking differently: INHALE TWO PUFFS BY MOUTH EVERY FOUR HOURS AS NEEDED shortness of breath/wheezing 10/31/14  Yes Laurey Morale, MD  Respiratory Therapy Supplies (FLUTTER) DEVI Use as directed 01/26/15  Yes Tanda Rockers, MD  tiotropium (SPIRIVA) 18 MCG inhalation capsule Place 1 capsule (18 mcg total) into inhaler and inhale daily. 12/03/13  Yes Erick Colace, NP  TraMADol HCl 50 MG TBDP Take 1 tablet by mouth every 6 (six) hours as needed. Patient taking differently: Take 2 tablets by mouth every 6 (six) hours as needed (pain).  05/22/14  Yes Barton Dubois, MD  levofloxacin (LEVAQUIN) 500 MG tablet Take 1 tablet (500 mg total) by mouth daily. Patient not taking: Reported on 02/01/2015 01/26/15   Tanda Rockers, MD   BP 138/55 mmHg  Pulse 84  Temp(Src) 98.3 F (36.8 C) (Oral)  Resp 18  SpO2 96% Physical Exam  Constitutional: She is oriented to  person, place, and time. She appears well-developed and well-nourished.  HENT:  Head: Normocephalic and atraumatic.  Eyes: Conjunctivae and EOM are normal. Pupils are equal, round, and reactive to light.  Neck: Normal range of motion. Neck supple.  Cardiovascular: Normal rate and regular rhythm.   Pulmonary/Chest: Effort normal.  Normal bilateral expiratory wheeze  Abdominal: Soft. Bowel sounds are normal.  Musculoskeletal: Normal range of motion.  Neurological: She is alert and oriented to person, place, and time.  Skin: Skin is warm and dry.  Psychiatric: She has a normal mood and affect. Her behavior is normal.  Nursing note and vitals reviewed.   ED Course  Procedures (including critical care time) Labs Review Labs Reviewed  CBC WITH DIFFERENTIAL/PLATELET - Abnormal; Notable for the following:    RBC 3.82 (*)    Hemoglobin 11.9 (*)    HCT 35.2 (*)    Platelets 431 (*)    All other components within normal limits  BASIC METABOLIC PANEL - Abnormal; Notable for the following:    Sodium 123 (*)    Chloride 79 (*)    CO2 35 (*)    Glucose, Bld 109 (*)    BUN 5 (*)    All other components within normal limits  TROPONIN I  Randolm Idol, ED    Imaging Review Dg Chest 2 View  02/01/2015   CLINICAL DATA:  Productive cough.  COPD.  Shortness of breath.  EXAM: CHEST  2 VIEW  COMPARISON:  01/26/2015 and 08/02/2014  FINDINGS: There is a new small right pleural effusion. There is slight pulmonary vascular prominence. Severe COPD. Old compression fracture in the upper thoracic spine.  Extensive calcification in the thoracic aorta and mitral valve annulus. No consolidative infiltrates.  IMPRESSION: 1. New small right pleural effusion. 2. Severe COPD. 3. Slight increased pulmonary vascularity since the prior study.   Electronically Signed   By: Lorriane Shire M.D.   On: 02/01/2015 19:59   I, Bronnie Vasseur, personally reviewed and evaluated these images and lab results as part of my  medical decision-making.   EKG Interpretation   Date/Time:  Sunday February 01 2015 19:37:25 EDT Ventricular Rate:  88 PR Interval:  166 QRS Duration: 152 QT Interval:  425 QTC Calculation: 514 R Axis:   -60 Text Interpretation:  Sinus rhythm Atrial premature complex Sinus pause  Probable left atrial enlargement Left bundle branch block Inferior  infarct, acute Confirmed by Trelyn Vanderlinde  MD, Tranae Laramie ([id number]) on 02/01/2015 7:40:04  PM      MDM   Final diagnoses:  COPD exacerbation  Pleural effusion, right  Hyponatremia   Patient with COPD with failed outpatient treatment. She is running a fever. Chest x-ray shows a right pleural effusion and severe COPD. Sodium is 123.  IV fluid bolus, IV Levaquin, admit to general medicine     Nat Christen, MD 02/01/15 2233

## 2015-02-01 NOTE — Assessment & Plan Note (Signed)
Adequate control on present rx, reviewed > no change in rx needed  Ok to to titrate to low 90s

## 2015-02-01 NOTE — Progress Notes (Signed)
ANTIBIOTIC CONSULT NOTE - INITIAL  Pharmacy Consult for Levaquin Indication: CAP  Allergies  Allergen Reactions  . Tetanus Toxoid Anaphylaxis  . Codeine Nausea And Vomiting and Other (See Comments)    Itching and faint   . Erythromycin Nausea And Vomiting  . Gabapentin     REACTION: severe edema, nausea, dizziness, disorientation  . Levaquin [Levofloxacin In D5w] Other (See Comments)    Joint pain; (states she can take it IV- not by mouth)  . Meloxicam Itching  . Methylprednisolone Hives  . Penicillins Hives  . Prednisone Other (See Comments)    GI bleed    Patient Measurements:     Vital Signs: Temp: 98.3 F (36.8 C) (08/14 1927) Temp Source: Oral (08/14 1927) BP: 138/55 mmHg (08/14 2010) Pulse Rate: 84 (08/14 2010) Intake/Output from previous day:   Intake/Output from this shift: Total I/O In: 600 [I.V.:600] Out: -   Labs:  Recent Labs  02/01/15 2010  WBC 7.3  HGB 11.9*  PLT 431*  CREATININE 0.56   Estimated Creatinine Clearance: 45.1 mL/min (by C-G formula based on Cr of 0.56). No results for input(s): VANCOTROUGH, VANCOPEAK, VANCORANDOM, GENTTROUGH, GENTPEAK, GENTRANDOM, TOBRATROUGH, TOBRAPEAK, TOBRARND, AMIKACINPEAK, AMIKACINTROU, AMIKACIN in the last 72 hours.   Microbiology: No results found for this or any previous visit (from the past 720 hour(s)).  Medical History: Past Medical History  Diagnosis Date  . HIP PAIN   . HYPERTENSION   . Restless leg syndrome   . COPD (chronic obstructive pulmonary disease)     a. Multiple prior admissions for acute hypoxic respiratory failure in setting of COPD exacerbations +/- pneumonia.  . Peptic ulcer disease     a. 2013: gastric antrum.  Marland Kitchen Hyponatremia   . Tobacco abuse   . Hypomagnesemia   . LBBB (left bundle branch block)     a. Intermittent, likely rate related.  . Pulmonary HTN     a. Elevated PA pressure by prior echoes.  . Mitral regurgitation     a. Mild MR by echo 2014, not mentioned on  11/2013 (severely calcified annulus).  Marland Kitchen Respiratory failure     a. Adm 11/2013 with severe hypercarbic and hypoxemic respiratory failure requiring intubation.   . Abnormal echocardiogram     a. 11/2013: decreased EF 45-50%, moderate AS- > subsequent cath 12/02/13 with mildly calcified cors without obstruction, mild AS, EF 60%, mild pulm HTN.  Marland Kitchen Aortic stenosis     a. 11/2013: mod by echo, then mild by cath.  . Pulmonary HTN     a. 11/2013: mild by cath.  . Wide-complex tachycardia     a. Felt likely atrial tach with rate-related LBBB.  Marland Kitchen Chronic diastolic CHF (congestive heart failure)     Medications:  Scheduled:  . [START ON 02/02/2015] aspirin  81 mg Oral q morning - 10a  . budesonide-formoterol  2 puff Inhalation BID  . clonazePAM  1 mg Oral QHS  . clonazePAM  1 mg Oral QHS  . [START ON 02/02/2015] diltiazem  120 mg Oral Daily  . enoxaparin (LOVENOX) injection  40 mg Subcutaneous QHS  . guaiFENesin  600 mg Oral BID  . [START ON 02/02/2015] losartan  50 mg Oral Daily  . [START ON 02/02/2015] magnesium oxide  400 mg Oral Daily  . [START ON 02/02/2015] tiotropium  18 mcg Inhalation Daily   Infusions:  . sodium chloride 125 mL/hr at 02/01/15 2258  . [START ON 02/02/2015] levofloxacin (LEVAQUIN) IV     Assessment:  74  yr female with recent cough/SOB/fever with productive sputum.  Placed on oral levaquin, which caused joint pain, and was switched to omnicef.  Patient presents with reports of worsening shortness of breath and sputum   Patient given Levaquin 500mg  IV in the ED (and tolerated IV fine)  Pharmacy consulted to continue dosing of Levaquin for CAP  CrCl = 45 ml/min  Goal of Therapy:  Eradication of infection  Plan:  Levaquin 250mg  IV q24h (beginning on 8/15) Watch renal function and adjust dosage as needed  Austen Oyster, Toribio Harbour, PharmD 02/01/2015,11:10 PM

## 2015-02-02 ENCOUNTER — Encounter (HOSPITAL_COMMUNITY): Payer: Self-pay

## 2015-02-02 ENCOUNTER — Telehealth: Payer: Self-pay | Admitting: Internal Medicine

## 2015-02-02 DIAGNOSIS — I5042 Chronic combined systolic (congestive) and diastolic (congestive) heart failure: Secondary | ICD-10-CM

## 2015-02-02 LAB — BASIC METABOLIC PANEL
ANION GAP: 9 (ref 5–15)
BUN: <5 mg/dL — ABNORMAL LOW (ref 6–20)
CALCIUM: 8.7 mg/dL — AB (ref 8.9–10.3)
CHLORIDE: 84 mmol/L — AB (ref 101–111)
CO2: 34 mmol/L — AB (ref 22–32)
Creatinine, Ser: 0.49 mg/dL (ref 0.44–1.00)
GFR calc non Af Amer: >60 mL/min (ref >60)
GLUCOSE: 92 mg/dL (ref 65–99)
POTASSIUM: 3.8 mmol/L (ref 3.5–5.1)
Sodium: 127 mmol/L — ABNORMAL LOW (ref 135–145)

## 2015-02-02 LAB — EXPECTORATED SPUTUM ASSESSMENT W REFEX TO RESP CULTURE

## 2015-02-02 LAB — STREP PNEUMONIAE URINARY ANTIGEN: Strep Pneumo Urinary Antigen: NEGATIVE

## 2015-02-02 LAB — HIV ANTIBODY (ROUTINE TESTING W REFLEX): HIV SCREEN 4TH GENERATION: NONREACTIVE

## 2015-02-02 MED ORDER — PRAMIPEXOLE DIHYDROCHLORIDE 0.125 MG PO TABS
0.1250 mg | ORAL_TABLET | Freq: Once | ORAL | Status: AC
  Administered 2015-02-02: 0.125 mg via ORAL
  Filled 2015-02-02: qty 1

## 2015-02-02 MED ORDER — LEVOFLOXACIN IN D5W 750 MG/150ML IV SOLN
750.0000 mg | INTRAVENOUS | Status: DC
  Administered 2015-02-02 – 2015-02-06 (×5): 750 mg via INTRAVENOUS
  Filled 2015-02-02 (×5): qty 150

## 2015-02-02 MED ORDER — LEVALBUTEROL HCL 0.63 MG/3ML IN NEBU
0.6300 mg | INHALATION_SOLUTION | Freq: Four times a day (QID) | RESPIRATORY_TRACT | Status: DC
  Administered 2015-02-02 – 2015-02-05 (×12): 0.63 mg via RESPIRATORY_TRACT
  Filled 2015-02-02 (×12): qty 3

## 2015-02-02 MED ORDER — PROMETHAZINE HCL 25 MG/ML IJ SOLN
6.2500 mg | INTRAMUSCULAR | Status: DC | PRN
  Administered 2015-02-02 – 2015-02-06 (×9): 6.25 mg via INTRAVENOUS
  Filled 2015-02-02 (×9): qty 1

## 2015-02-02 NOTE — Telephone Encounter (Signed)
Janett Billow (pt grand daughter) called back and she wanted to let MR know pt is over at [facility] and if he gets a chance pt has requested to see him. She is aware MR is working over at [facility] I did advised her she needed to let her attending doc know that they want pulm to consult. She will do. No call back needed and FYI for MR

## 2015-02-02 NOTE — Progress Notes (Addendum)
Progress Note   Jill White:[id number] DOB: 28-Dec-1940 DOA: 02/01/2015 PCP: Laurey Morale, MD   Brief Narrative:   Jill White is an 74 y.o. female with a PMH of COPD on home oxygen who was recently evaluated by Dr. Melvyn Novas 01/26/15 for evaluation of fever, shortness of breath and cough, treated with Levaquin with some improvement however Levaquin had to be discontinued secondary to joint pain so her antibiotics were switched to [facility] however her symptoms returned on this therapy, who was admitted 02/01/15 with worsening shortness of breath.  Assessment/Plan:   Principal Problem:   COPD exacerbation - Multiple allergies complicates treatment. - Risk of tendon rupture discussed with patient by admitting physician, patient agreed to proceed with treatment. - Unable to provide IV steroids or oral prednisone secondary to allergy. - Continue Spiriva, Symbicort, nebulized broncho-dilators, Mucinex and magnesium. - Patient requests pulmonology consult be called.  Spoke with Dr. Vaughan Browner who will see the patient in consultation.  Active Problems:   Essential hypertension - Continue Cardizem and losartan.    Dehydration with hyponatremia - Hydrated. Sodium 123---> 127. Continue IV fluids. - The patient does have a history of baseline hyponatremia. - Lasix currently on hold.    CAP (community acquired pneumonia) - Continue IV Levaquin. Chest x-ray showed a new small right pleural effusion, but no consolidation. - Follow-up strep pneumonia/Legionella antigens, HIV antibody, and blood/sputum cultures.    Chronic systolic and diastolic CHF (congestive heart failure) - Watch closely for signs of volume overload while on IV fluids. - EF 45-50 percent on Echo done 11/23/13.    Chronic respiratory failure with hypercapnia - Continue supplemental oxygen as needed. Treat COPD/CAP as outlined above.    DVT Prophylaxis - Lovenox ordered.  Family Communication: Granddaughter at  bedside. Disposition Plan: Home when stable.  Lives alone. Code Status:     Code Status Orders        Start     Ordered   02/01/15 2225  Full code   Continuous     02/01/15 2226    Advance Directive Documentation        Most Recent Value   Type of Advance Directive  Living will   Pre-existing out of facility DNR order (yellow form or pink MOST form)     "MOST" Form in Place?          IV Access:    Peripheral IV   Procedures and diagnostic studies:   Dg Chest 2 View  02/01/2015   CLINICAL DATA:  Productive cough.  COPD.  Shortness of breath.  EXAM: CHEST  2 VIEW  COMPARISON:  01/26/2015 and 08/02/2014  FINDINGS: There is a new small right pleural effusion. There is slight pulmonary vascular prominence. Severe COPD. Old compression fracture in the upper thoracic spine.  Extensive calcification in the thoracic aorta and mitral valve annulus. No consolidative infiltrates.  IMPRESSION: 1. New small right pleural effusion. 2. Severe COPD. 3. Slight increased pulmonary vascularity since the prior study.   Electronically Signed   By: Lorriane Shire M.D.   On: 02/01/2015 19:59     Medical Consultants:    Pulmonology  Anti-Infectives:    Levaquin 02/01/15--->  Subjective:   Jill White feels nauseated and "sick".  Very anxious.  Requesting I call her pulmonary doctor.  + cough, productive of yellow/green sputum.  Objective:    Filed Vitals:   02/01/15 2309 02/02/15 0001 02/02/15 0042 02/02/15 0529  BP: 162/58 142/59  168/81  Pulse: 73 84  92  Temp: 98.4 F (36.9 C) 97.6 F (36.4 C)  97.8 F (36.6 C)  TempSrc: Oral Oral  Oral  Resp: 21 23  24   Height:  5\' 1"  (1.549 m)    Weight:  59.875 kg (132 lb)    SpO2: 95% 95% 92% 92%    Intake/Output Summary (Last 24 hours) at 02/02/15 0752 Last data filed at 02/02/15 0100  Gross per 24 hour  Intake   1340 ml  Output      0 ml  Net   1340 ml    Exam: Gen:  NAD, irritable Cardiovascular:  RRR, No  M/R/G Respiratory:  Lungs diminished with expiratory wheezes Gastrointestinal:  Abdomen soft, NT/ND, + BS Extremities:  No C/E/C   Data Reviewed:    Labs: Basic Metabolic Panel:  Recent Labs Lab 02/01/15 2010 02/02/15 0515  NA 123* 127*  K 4.4 3.8  CL 79* 84*  CO2 35* 34*  GLUCOSE 109* 92  BUN 5* <5*  CREATININE 0.56 0.49  CALCIUM 9.2 8.7*   GFR Estimated Creatinine Clearance: 49.7 mL/min (by C-G formula based on Cr of 0.49). Liver Function Tests: No results for input(s): AST, ALT, ALKPHOS, BILITOT, PROT, ALBUMIN in the last 168 hours. No results for input(s): LIPASE, AMYLASE in the last 168 hours. No results for input(s): AMMONIA in the last 168 hours. Coagulation profile No results for input(s): INR, PROTIME in the last 168 hours.  CBC:  Recent Labs Lab 02/01/15 2010  WBC 7.3  NEUTROABS 4.4  HGB 11.9*  HCT 35.2*  MCV 92.1  PLT 431*   Cardiac Enzymes:  Recent Labs Lab 02/01/15 2010  TROPONINI <0.03   BNP (last 3 results)  Recent Labs  03/26/14 1005 05/12/14 1316 05/14/14 0533  PROBNP 94.0 577.4* 1351.0*   Microbiology No results found for this or any previous visit (from the past 240 hour(s)).   Medications:   . aspirin  81 mg Oral q morning - 10a  . budesonide-formoterol  2 puff Inhalation BID  . clonazePAM  1 mg Oral QHS  . diltiazem  120 mg Oral Daily  . enoxaparin (LOVENOX) injection  40 mg Subcutaneous QHS  . guaiFENesin  600 mg Oral BID  . levalbuterol  0.63 mg Nebulization QID  . levofloxacin (LEVAQUIN) IV  250 mg Intravenous Q24H  . losartan  50 mg Oral Daily  . magnesium oxide  400 mg Oral Daily  . tiotropium  18 mcg Inhalation Daily   Continuous Infusions: . sodium chloride 125 mL/hr at 02/01/15 2258    Time spent: 35 minutes with > 50% of time discussing current diagnostic test results, clinical impression and plan of care.   LOS: 1 day   Ancel Easler  [facility] Pager [phone]. If unable to reach me by  pager, please call my cell phone at [phone].  *Please refer to [email or web address], password [email or web address] to get updated schedule on who will round on this patient, as hospitalists switch teams weekly. If 7PM-7AM, please contact night-coverage at [email or web address], password [email or web address] for any overnight needs.  02/02/2015, 7:52 AM

## 2015-02-02 NOTE — Progress Notes (Signed)
ANTIBIOTIC CONSULT NOTE  Pharmacy Consult for Levaquin Indication: CAP  Allergies  Allergen Reactions  . Tetanus Toxoid Anaphylaxis  . Codeine Nausea And Vomiting and Other (See Comments)    Itching and faint   . Erythromycin Nausea And Vomiting  . Gabapentin     REACTION: severe edema, nausea, dizziness, disorientation  . Levaquin [Levofloxacin In D5w] Other (See Comments)    Joint pain; (states she can take it IV- not by mouth)  . Meloxicam Itching  . Methylprednisolone Hives  . Penicillins Hives  . Prednisone Other (See Comments)    GI bleed    Patient Measurements: Height: 5\' 1"  (154.9 cm) Weight: 132 lb (59.875 kg) IBW/kg (Calculated) : 47.8   Vital Signs: Temp: 97.8 F (36.6 C) (08/15 0529) Temp Source: Oral (08/15 0529) BP: 168/81 mmHg (08/15 0529) Pulse Rate: 92 (08/15 0529) Intake/Output from previous day: 08/14 0701 - 08/15 0700 In: 3009 [P.O.:240; I.V.:600; IV Piggyback:500] Out: -   Labs:  Recent Labs  02/01/15 2010 02/02/15 0515  WBC 7.3  --   HGB 11.9*  --   PLT 431*  --   CREATININE 0.56 0.49   Estimated Creatinine Clearance: 49.7 mL/min (by C-G formula based on Cr of 0.49).   Assessment: 74 yr female with recent cough/SOB/fever and productive sputum.  Initially placed on oral levaquin, which caused joint pain, and was switched to omnicef.  Patient presents with reports of worsening shortness of breath and sputum.  She was given Levaquin 500mg  IV in the ED (which she tolerated well).  Pharmacy consulted to continue dosing of Levaquin IV for CAP.  8/14 >>Levaquin >>   Today, 02/02/2015:  Day #2 Levaquin  Tm afebrile  WBCs WNL, 7/3 (8/14)  SCr 0.49 with CrCl 49 ml/min  Blood cultures in progress  Goal of Therapy:  Appropriate abx dosing, eradication of infection.   Plan:   Levaquin 750mg  IV q24h  Follow up renal fxn, culture results, and clinical course.   Gretta Arab PharmD, BCPS Pager [phone] 02/02/2015 9:04 AM

## 2015-02-02 NOTE — Telephone Encounter (Signed)
lmtcb for Jill White to make aware of below.

## 2015-02-02 NOTE — Telephone Encounter (Signed)
Dr Vaughan Browner will do that consult

## 2015-02-02 NOTE — Telephone Encounter (Signed)
lmtcb X1 for Janett Billow (pt's EC)

## 2015-02-03 ENCOUNTER — Telehealth: Payer: Self-pay | Admitting: *Deleted

## 2015-02-03 LAB — LEGIONELLA ANTIGEN, URINE

## 2015-02-03 LAB — BASIC METABOLIC PANEL
ANION GAP: 6 (ref 5–15)
CALCIUM: 8.4 mg/dL — AB (ref 8.9–10.3)
CO2: 32 mmol/L (ref 22–32)
Chloride: 95 mmol/L — ABNORMAL LOW (ref 101–111)
Creatinine, Ser: 0.4 mg/dL — ABNORMAL LOW (ref 0.44–1.00)
GFR calc Af Amer: >60 mL/min (ref >60)
GLUCOSE: 88 mg/dL (ref 65–99)
POTASSIUM: 4.3 mmol/L (ref 3.5–5.1)
SODIUM: 133 mmol/L — AB (ref 135–145)

## 2015-02-03 LAB — EXPECTORATED SPUTUM ASSESSMENT W REFEX TO RESP CULTURE

## 2015-02-03 LAB — EXPECTORATED SPUTUM ASSESSMENT W GRAM STAIN, RFLX TO RESP C

## 2015-02-03 LAB — CBC
HCT: 34.9 % — ABNORMAL LOW (ref 36.0–46.0)
Hemoglobin: 11.3 g/dL — ABNORMAL LOW (ref 12.0–15.0)
MCH: 30.6 pg (ref 26.0–34.0)
MCHC: 32.4 g/dL (ref 30.0–36.0)
MCV: 94.6 fL (ref 78.0–100.0)
Platelets: 396 10*3/uL (ref 150–400)
RBC: 3.69 MIL/uL — ABNORMAL LOW (ref 3.87–5.11)
RDW: 13.3 % (ref 11.5–15.5)
WBC: 6.9 10*3/uL (ref 4.0–10.5)

## 2015-02-03 MED ORDER — PRAMIPEXOLE DIHYDROCHLORIDE 0.125 MG PO TABS
0.1250 mg | ORAL_TABLET | Freq: Three times a day (TID) | ORAL | Status: DC | PRN
  Administered 2015-02-06: 0.125 mg via ORAL
  Filled 2015-02-03 (×4): qty 1

## 2015-02-03 NOTE — Progress Notes (Addendum)
Progress Note   Jill White BPZ:[id number] DOB: [birth year] DOA: 02/01/2015 PCP: Laurey Morale, MD   Brief Narrative:   Jill White is an 74 y.o. female with a PMH of COPD on home oxygen who was recently evaluated by Dr. Melvyn Novas 01/26/15 for evaluation of fever, shortness of breath and cough, treated with Levaquin with some improvement however Levaquin had to be discontinued secondary to joint pain so her antibiotics were switched to Jill White however her symptoms returned on this therapy, who was admitted 02/01/15 with worsening shortness of breath.  Assessment/Plan:   Principal Problem:   COPD exacerbation - Multiple allergies complicates treatment. - Risk of tendon rupture discussed with patient by admitting physician, patient agreed to proceed with treatment. - Unable to provide IV steroids or oral prednisone secondary to allergy. - Continue Spiriva, Symbicort, nebulized broncho-dilators, Mucinex and magnesium. - Patient requests pulmonology consult be called. Spoke with Dr. Vaughan Browner who will see the patient in consultation.  Active Problems:   Restless Legs Syndrome - Given a one time dose of Mirapex yesterday, with improvement. - Mirapex PRN ordered.    Essential hypertension - Continue Cardizem and losartan.    Dehydration with hyponatremia - Hydrated. Sodium 123---> 127---> 133. KVO IV fluids. - The patient does have a history of baseline hyponatremia. - Lasix currently on hold. Resume for signs of volume overload. - Discussed with patient the concept of taking Lasix on a PRN basis based on weight.    CAP (community acquired pneumonia) - Continue IV Levaquin. Chest x-ray showed a new small right pleural effusion, but no consolidation. - Follow-up Legionella antigen, and blood/sputum cultures. - Strep pneumonia antigen negative. HIV nonreactive.    Chronic systolic and diastolic CHF (congestive heart failure) - Watch closely for signs of volume overload while on IV  fluids (IVF rate decreased today). - EF 45-50 percent on Echo done 11/23/13.    Chronic respiratory failure with hypercapnia - Continue supplemental oxygen as needed. Treat COPD/CAP as outlined above.    DVT Prophylaxis - Lovenox ordered.  Family Communication: Granddaughter at bedside. Disposition Plan: Home when pneumonia treated & patient able to tolerate activity, possibly 02/04/15 if OK with pulmonary.  Lives alone. Code Status:     Code Status Orders        Start     Ordered   02/01/15 2225  Full code   Continuous     02/01/15 2226    Advance Directive Documentation        Most Recent Value   Type of Advance Directive  Living will   Pre-existing out of facility DNR order (yellow form or pink MOST form)     "MOST" Form in Place?          IV Access:    Peripheral IV   Procedures and diagnostic studies:   Dg Chest 2 View  02/01/2015   CLINICAL DATA:  Productive cough.  COPD.  Shortness of breath.  EXAM: CHEST  2 VIEW  COMPARISON:  01/26/2015 and 08/02/2014  FINDINGS: There is a new small right pleural effusion. There is slight pulmonary vascular prominence. Severe COPD. Old compression fracture in the upper thoracic spine.  Extensive calcification in the thoracic aorta and mitral valve annulus. No consolidative infiltrates.  IMPRESSION: 1. New small right pleural effusion. 2. Severe COPD. 3. Slight increased pulmonary vascularity since the prior study.   Electronically Signed   By: Jill White M.D.   On: 02/01/2015 19:59  Medical Consultants:    Pulmonology  Anti-Infectives:    Levaquin 02/01/15--->  Subjective:   Jill White feels better.  Is able to speak in full sentences now, and was able to eat.  Still with some nausea, but improved with taking the alternating anti-emetics. Coughing less.  Objective:    Filed Vitals:   02/02/15 2024 02/02/15 2027 02/02/15 2055 02/03/15 0632  BP:   154/72 139/44  Pulse:   83 91  Temp:   97.8 F (36.6 C)  98.1 F (36.7 C)  TempSrc:   Oral Oral  Resp:   22 21  Height:      Weight:      SpO2: 93% 93% 96% 93%    Intake/Output Summary (Last 24 hours) at 02/03/15 0753 Last data filed at 02/03/15 0601  Gross per 24 hour  Intake   1375 ml  Output      0 ml  Net   1375 ml    Exam: Gen:  NAD, irritable Cardiovascular:  RRR, No M/R/G Respiratory:  Lungs diminished with expiratory wheezes Gastrointestinal:  Abdomen soft, NT/ND, + BS Extremities:  No C/E/C   Data Reviewed:    Labs: Basic Metabolic Panel:  Recent Labs Lab 02/01/15 2010 02/02/15 0515 02/03/15 0548  NA 123* 127* 133*  K 4.4 3.8 4.3  CL 79* 84* 95*  CO2 35* 34* 32  GLUCOSE 109* 92 88  BUN 5* <5* <5*  CREATININE 0.56 0.49 0.40*  CALCIUM 9.2 8.7* 8.4*   GFR Estimated Creatinine Clearance: 49.7 mL/min (by C-G formula based on Cr of 0.4). Liver Function Tests: No results for input(s): AST, ALT, ALKPHOS, BILITOT, PROT, ALBUMIN in the last 168 hours. No results for input(s): LIPASE, AMYLASE in the last 168 hours. No results for input(s): AMMONIA in the last 168 hours. Coagulation profile No results for input(s): INR, PROTIME in the last 168 hours.  CBC:  Recent Labs Lab 02/01/15 2010 02/03/15 0548  WBC 7.3 6.9  NEUTROABS 4.4  --   HGB 11.9* 11.3*  HCT 35.2* 34.9*  MCV 92.1 94.6  PLT 431* 396   Cardiac Enzymes:  Recent Labs Lab 02/01/15 2010  TROPONINI <0.03   BNP (last 3 results)  Recent Labs  03/26/14 1005 05/12/14 1316 05/14/14 0533  PROBNP 94.0 577.4* 1351.0*   Microbiology Recent Results (from the past 240 hour(s))  Culture, sputum-assessment     Status: None   Collection Time: 02/02/15  6:10 PM  Result Value Ref Range Status   Specimen Description SPUTUM  Final   Special Requests NONE  Final   Sputum evaluation   Final    MICROSCOPIC FINDINGS SUGGEST THAT THIS SPECIMEN IS NOT REPRESENTATIVE OF LOWER RESPIRATORY SECRETIONS. PLEASE RECOLLECT. RESULTS CALLED TO, READ BACK BY  AND  VERIFIED  WITH Ennis Forts [id number] @ [facility]    Report Status 02/02/2015 FINAL  Final     Medications:   . aspirin  81 mg Oral q morning - 10a  . budesonide-formoterol  2 puff Inhalation BID  . clonazePAM  1 mg Oral QHS  . diltiazem  120 mg Oral Daily  . enoxaparin (LOVENOX) injection  40 mg Subcutaneous QHS  . guaiFENesin  600 mg Oral BID  . levalbuterol  0.63 mg Nebulization QID  . levofloxacin (LEVAQUIN) IV  750 mg Intravenous Q24H  . losartan  50 mg Oral Daily  . magnesium oxide  400 mg Oral Daily  . tiotropium  18 mcg Inhalation Daily  Continuous Infusions: . sodium chloride 125 mL/hr at 02/01/15 2258    Time spent: 35 minutes with > 50% of time discussing current diagnostic test results, clinical impression and plan of care.   LOS: 2 days   Jill White  [facility] Pager [phone]. If unable to reach me by pager, please call my cell phone at [phone].  *Please refer to [email or web address], password [email or web address] to get updated schedule on who will round on this patient, as hospitalists switch teams weekly. If 7PM-7AM, please contact night-coverage at [email or web address], password [email or web address] for any overnight needs.  02/03/2015, 7:53 AM

## 2015-02-03 NOTE — Care Management Important Message (Signed)
Important Message  Patient Details  Name: Jill White MRN: [id number] Date of Birth: 05/06/1941   Medicare Important Message Given:  [facility] notification given    Camillo Flaming 02/03/2015, 12:32 [place] Message  Patient Details  Name: Jill White MRN: [id number] Date of Birth: September 08, 1940   Medicare Important Message Given:  Yes-second notification given    Camillo Flaming 02/03/2015, 12:32 PM

## 2015-02-03 NOTE — Telephone Encounter (Signed)
Patient is requesting a refill of clonazePAM Jill White) tablet 1 mg [id number] [phone] [address]

## 2015-02-03 NOTE — Telephone Encounter (Signed)
Call in #[phone] with 5 rf 

## 2015-02-03 NOTE — Consult Note (Signed)
Name: Jill White MRN: [id number] DOB: 05/26/1941    ADMISSION DATE:  02/01/2015 CONSULTATION DATE:  02/03/15  REFERRING MD :  Dr. Rockne Menghini   CHIEF COMPLAINT:  AECOPD /   BRIEF PATIENT DESCRIPTION: [name], current smoker, with PMH of COPD, PAH, LBBB, chronic diastolic CHF who presented to [facility] on 8/14 with worsening SOB.  The patient was recently seen for the sam and treated with PO abx without improvement.  PCCM consulted 8/16 for evaluation.    SIGNIFICANT EVENTS  8/14  Admit with AECOPD, small R pleural effusion  STUDIES:  8/14  CXR >> COPD, no acute infiltrate   HISTORY OF PRESENT ILLNESS:  [name], smoker, with PMH of HTN, LBBB, chronic respiratory failure, pulmonary hypertension, MR/AS, chronic diastolic CHF, chronic left ankle joint pain (S/P cortisone injections), diverticulitis and PUD who presented to [facility] on 8/14 with increasing SOB.    The patient was seen June 21 for acute sinusitis and was treated with a Z-Pak at that time.  After that episode she feels as though congestion settled in her chest. She was recently seen in the Pulmonary office on 8/8 for 2 day history of chest congestion, cough with productive green sputum and shortness of breath.  She was treated with antibiotics at that time for an acute exacerbation of COPD.  Initially she was treated with levaquin but had pain in the tendons in her left foot and abx were changed (Omnicef).  Respiratory symptoms worsened after antibiotic change and patient reported to the ER for evaluation.  She was admitted to the hospital per [facility] for further evaluation.  Initial chest xray showed findings consistent with COPD, and a new small R pleural effusion. No evidence of acute infiltrate.  WBC 7.3, Hgb 11.3, platelets 431, Na 123, K 4.4, and sr cr 0.56.  The patient was treated with bronchodilators & empiric abx for COPD / possible CAP.  Urine strep antigen assessed and negative.  Patient reports she can take IV Levaquin but not oral and  she can have IV steroid-induced but not oral prednisone due to allergies.  Since admission she reports improvement in symptoms. Sputum has changed from green to clear. Her shortness of breath has improved. She currently denies fevers and chills. She reports concerns regarding her low sodium and Lasix use. She relays a story of a mixup of losartan/hydrochlorothiazide at her pharmacy (was previously taking Lasix and HCTZ). She is followed by Dr. Harrington Challenger for cardiology, Dr. Sarajane Jews for primary care.   The patient continues to smoke 1-5 cigarettes per day. She wears her oxygen at night and occasionally with activity.  She is a retired Radio broadcast assistant (retired at age 75). The patient continues to drive and lives alone.  The patient requested pulmonary consultation.    PAST MEDICAL HISTORY :   has a past medical history of HIP PAIN; HYPERTENSION; Restless leg syndrome; COPD (chronic obstructive pulmonary disease); Peptic ulcer disease; Hyponatremia; Tobacco abuse; Hypomagnesemia; LBBB (left bundle branch block); Pulmonary HTN; Mitral regurgitation; Respiratory failure; Abnormal echocardiogram; Aortic stenosis; Pulmonary HTN; Wide-complex tachycardia; Chronic diastolic CHF (congestive heart failure); Diverticulitis of colon (without mention of hemorrhage) (08/09/2013); Septic shock (12/11/2013); and Acute on chronic combined systolic and diastolic congestive heart failure, NYHA class 4 (11/27/2013).  has past surgical history that includes Abdominal hysterectomy; Dilation and curettage of uterus; Tonsillectomy; Esophagogastroduodenoscopy (07/09/2011); Cataract extraction w/ intraocular lens  implant, bilateral (Bilateral, 03/2013); left and right heart catheterization with coronary angiogram (N/A, 12/02/2013); and Laparoscopic sigmoid colectomy (  07-03-14).    Prior to Admission medications   Medication Sig Start Date End Date Taking? Authorizing Provider  ALPRAZolam Duanne Moron) 0.25 MG tablet Take 0.25 mg by mouth at bedtime as  needed for anxiety.   Yes Historical Provider, MD  aspirin 81 MG tablet Take 81 mg by mouth every morning.    Yes Historical Provider, MD  budesonide-formoterol (SYMBICORT) 80-4.5 MCG/ACT inhaler Inhale 2 puffs into the lungs 2 (two) times daily. 12/31/14  Yes Brand Males, MD  cefdinir (OMNICEF) 300 MG capsule Take 1 capsule (300 mg total) by mouth 2 (two) times daily. 01/28/15  Yes Tanda Rockers, MD  clonazePAM (KLONOPIN) 0.5 MG tablet TAKE TWO TABLET BY MOUTH EVERY AT BEDTIME 01/30/15  Yes Laurey Morale, MD  diltiazem (CARDIZEM CD) 120 MG 24 hr capsule Take 1 capsule (120 mg total) by mouth daily. 12/03/13  Yes Erick Colace, NP  furosemide (LASIX) 20 MG tablet Take 1.5 tablets (30 mg total) by mouth daily. 10/14/14  Yes Fay Records, MD  guaiFENesin (MUCINEX) 600 MG 12 hr tablet Take 600 mg by mouth 2 (two) times daily.   Yes Historical Provider, MD  losartan (COZAAR) 50 MG tablet Take 1 tablet (50 mg total) by mouth daily. 03/06/14  Yes Fay Records, MD  magnesium oxide (MAG-OX) 400 MG tablet Take 1 tablet (400 mg total) by mouth daily. 07/31/14  Yes Scott T Kathlen Mody, PA-C  ondansetron (ZOFRAN) 4 MG tablet Take 4 mg by mouth every 4 (four) hours as needed for nausea or vomiting.   Yes Historical Provider, MD  potassium chloride (K-DUR) 10 MEQ tablet Take 1 tablet (10 mEq total) by mouth daily. 11/26/14  Yes Fay Records, MD  PROAIR HFA 108 (90 BASE) MCG/ACT inhaler INHALE TWO PUFFS BY MOUTH EVERY FOUR HOURS AS NEEDED Patient taking differently: INHALE TWO PUFFS BY MOUTH EVERY FOUR HOURS AS NEEDED shortness of breath/wheezing 10/31/14  Yes Laurey Morale, MD  Respiratory Therapy Supplies (FLUTTER) DEVI Use as directed 01/26/15  Yes Tanda Rockers, MD  tiotropium (SPIRIVA) 18 MCG inhalation capsule Place 1 capsule (18 mcg total) into inhaler and inhale daily. 12/03/13  Yes Erick Colace, NP  TraMADol HCl 50 MG TBDP Take 1 tablet by mouth every 6 (six) hours as needed. Patient taking differently: Take 2  tablets by mouth every 6 (six) hours as needed (pain).  05/22/14  Yes Barton Dubois, MD   Allergies  Allergen Reactions  . Tetanus Toxoid Anaphylaxis  . Codeine Nausea And Vomiting and Other (See Comments)    Itching and faint   . Erythromycin Nausea And Vomiting  . Gabapentin     REACTION: severe edema, nausea, dizziness, disorientation  . Levaquin [Levofloxacin In D5w] Other (See Comments)    Joint pain; (states she can take it IV- not by mouth)  . Meloxicam Itching  . Methylprednisolone Hives  . Penicillins Hives  . Prednisone Other (See Comments)    GI bleed    FAMILY HISTORY:  family history includes Anemia in her father; Dementia in her mother; Heart disease in her brother and sister.   SOCIAL HISTORY:  reports that she has been smoking Cigarettes.  She has smoked for the past 50 years. She has never used smokeless tobacco. She reports that she does not drink alcohol or use illicit drugs.  REVIEW OF SYSTEMS:   Constitutional: Negative for fever, chills, weight loss, malaise/fatigue and diaphoresis.  HENT: Negative for hearing loss, ear pain, nosebleeds, congestion, sore  throat, neck pain, tinnitus and ear discharge.   Eyes: Negative for blurred vision, double vision, photophobia, pain, discharge and redness.  Respiratory: Negative for hemoptysis, and stridor.  Reports cough, green sputum production, shortness of breath, wheezing Cardiovascular: Negative for chest pain, palpitations, orthopnea, claudication, leg swelling and PND.  Gastrointestinal: Negative for heartburn, nausea, vomiting, abdominal pain, diarrhea, constipation, blood in stool and melena.  Genitourinary: Negative for dysuria, urgency, frequency, hematuria and flank pain.  Musculoskeletal: Negative for myalgias, back pain, and falls. Reports chronic left foot pain Skin: Negative for itching and rash.  Neurological: Negative for dizziness, tingling, tremors, sensory change, speech change, focal weakness,  seizures, loss of consciousness, weakness and headaches.  Endo/Heme/Allergies: Negative for environmental allergies and polydipsia. Does not bruise/bleed easily.  SUBJECTIVE: Pt reports feeling better since admission, not back to baseline but improved.    VITAL SIGNS: Temp:  [97.8 F (36.6 C)-98.1 F (36.7 C)] 98.1 F (36.7 C) (08/16 6384) Pulse Rate:  [83-91] 91 (08/16 0632) Resp:  [21-22] 21 (08/16 0632) BP: (139-154)/(44-72) 139/44 mmHg (08/16 0632) SpO2:  [91 %-96 %] 91 % (08/16 1256)  PHYSICAL EXAMINATION: General:  Elderly female in NAD Neuro:  AAOx4, speech clear, MAE, speaking full paragraphs, no focal deficits  HEENT:  MM pink/moist, no jvd Cardiovascular:  s1s2 rrr, no m/r/g  Lungs:  Prolonged exp phase, lungs diminished bilaterally with inspiratory crackles lower R Abdomen:  Obese, soft, bsx4 active  Musculoskeletal:  No acute deformities, left ankle larger than R Skin:  Warm/dry, mild R eye dependent edema    Recent Labs Lab 02/01/15 2010 02/02/15 0515 02/03/15 0548  NA 123* 127* 133*  K 4.4 3.8 4.3  CL 79* 84* 95*  CO2 35* 34* 32  BUN 5* <5* <5*  CREATININE 0.56 0.49 0.40*  GLUCOSE 109* 92 88    Recent Labs Lab 02/01/15 2010 02/03/15 0548  HGB 11.9* 11.3*  HCT 35.2* 34.9*  WBC 7.3 6.9  PLT 431* 396   Dg Chest 2 View  02/01/2015   CLINICAL DATA:  Productive cough.  COPD.  Shortness of breath.  EXAM: CHEST  2 VIEW  COMPARISON:  01/26/2015 and 08/02/2014  FINDINGS: There is a new small right pleural effusion. There is slight pulmonary vascular prominence. Severe COPD. Old compression fracture in the upper thoracic spine.  Extensive calcification in the thoracic aorta and mitral valve annulus. No consolidative infiltrates.  IMPRESSION: 1. New small right pleural effusion. 2. Severe COPD. 3. Slight increased pulmonary vascularity since the prior study.   Electronically Signed   By: Lorriane Shire M.D.   On: 02/01/2015 19:59    ASSESSMENT / PLAN:   COPD  - with acute exacerbation.  Prior sinus infection before exacerbation.   Tobacco Abuse   Plan: Continue IV levaquin  Pulmonary hygiene:  IS, mobilize  Smoking cessation counseling  Continue symbicort, spiriva  Xopenex QID No steroids as pt is allergic (no significant bronchospasm on exam)   CAP - initial cxr without acute infiltrate.   Plan: Repeat CXR in am to ensure no development of infiltrate with hydration  Continue IV levaquin, after review of film in the am, will decide regarding duration of therapy   Chronic Respiratory Failure with Hypercapnia   Plan: Oxygen to support saturations 88-93%   Small R Pleural Effusion - tiny effusion, not enough for thora, hx CHF  Plan:  Review CXR in am for increase in effusion  If large enough, consider sampling (not likely) Discussed with Dr. Rockne Menghini regarding  CHF / hyponatremia and possibility of PRN lasix at home.   Not sure the patient would be compliant with weighing herself??   Noe Gens, NP-C [facility] Pgr: [phone] or if no answer [phone] 02/03/2015, 1:54 PM

## 2015-02-04 ENCOUNTER — Inpatient Hospital Stay (HOSPITAL_COMMUNITY): Payer: Medicare Other

## 2015-02-04 DIAGNOSIS — I5032 Chronic diastolic (congestive) heart failure: Secondary | ICD-10-CM

## 2015-02-04 DIAGNOSIS — J189 Pneumonia, unspecified organism: Secondary | ICD-10-CM

## 2015-02-04 DIAGNOSIS — J441 Chronic obstructive pulmonary disease with (acute) exacerbation: Principal | ICD-10-CM

## 2015-02-04 LAB — BASIC METABOLIC PANEL
ANION GAP: 5 (ref 5–15)
BUN: 5 mg/dL — AB (ref 6–20)
CHLORIDE: 93 mmol/L — AB (ref 101–111)
CO2: 34 mmol/L — ABNORMAL HIGH (ref 22–32)
Calcium: 8.2 mg/dL — ABNORMAL LOW (ref 8.9–10.3)
Creatinine, Ser: 0.45 mg/dL (ref 0.44–1.00)
Glucose, Bld: 88 mg/dL (ref 65–99)
POTASSIUM: 4.1 mmol/L (ref 3.5–5.1)
SODIUM: 132 mmol/L — AB (ref 135–145)

## 2015-02-04 MED ORDER — FUROSEMIDE 20 MG PO TABS
20.0000 mg | ORAL_TABLET | Freq: Every day | ORAL | Status: DC
  Administered 2015-02-04 – 2015-02-05 (×2): 20 mg via ORAL
  Filled 2015-02-04 (×3): qty 1

## 2015-02-04 MED ORDER — CLONAZEPAM 1 MG PO TABS: 1.0000 mg | ORAL_TABLET | Freq: Every day | ORAL | Status: DC

## 2015-02-04 MED ORDER — CLONAZEPAM 0.5 MG PO TABS
0.5000 mg | ORAL_TABLET | Freq: Once | ORAL | Status: AC
  Administered 2015-02-04: 0.5 mg via ORAL
  Filled 2015-02-04: qty 1

## 2015-02-04 NOTE — Telephone Encounter (Signed)
I called in script 

## 2015-02-04 NOTE — Consult Note (Signed)
Name: Jill White MRN: [id number] DOB: 1940/07/01    ADMISSION DATE:  02/01/2015 CONSULTATION DATE:  02/03/15  REFERRING MD :  Dr. Rockne Menghini   CHIEF COMPLAINT:  AECOPD /   BRIEF PATIENT DESCRIPTION: Jill White y/o F, current smoker, with PMH of COPD, PAH, LBBB, chronic diastolic CHF who presented to [facility] on 8/14 with worsening SOB.  The patient was recently seen for the sam and treated with PO abx without improvement.  PCCM consulted 8/16 for evaluation.    SIGNIFICANT EVENTS  8/14  Admit with AECOPD, small R pleural effusion  STUDIES:  8/14  CXR >> COPD, no acute infiltrate 8/16 CXR >> worsening CHF and pleural effusion.  HISTORY OF PRESENT ILLNESS:  Jill White y/o F, smoker, with PMH of HTN, LBBB, chronic respiratory failure, pulmonary hypertension, MR/AS, chronic diastolic CHF, chronic left ankle joint pain (S/P cortisone injections), diverticulitis and PUD who presented to [facility] on 8/14 with increasing SOB.    The patient was seen June 21 for acute sinusitis and was treated with a Z-Pak at that time.  After that episode she feels as though congestion settled in her chest. She was recently seen in the Pulmonary office on 8/8 for Jill White day history of chest congestion, cough with productive green sputum and shortness of breath.  She was treated with antibiotics at that time for an acute exacerbation of COPD.  Initially she was treated with levaquin but had pain in the tendons in her left foot and abx were changed (Omnicef).  Respiratory symptoms worsened after antibiotic change and patient reported to the ER for evaluation.  She was admitted to the hospital per [facility] for further evaluation.  Initial chest xray showed findings consistent with COPD, and a new small R pleural effusion. No evidence of acute infiltrate.  WBC 7.3, Hgb 11.3, platelets 431, Na 123, K 4.4, and sr cr 0.56.  The patient was treated with bronchodilators & empiric abx for COPD / possible CAP.  Urine strep antigen assessed and negative.  Patient  reports she can take IV Levaquin but not oral and she can have IV steroid-induced but not oral prednisone due to allergies.  Since admission she reports improvement in symptoms. Sputum has changed from green to clear. Her shortness of breath has improved. She currently denies fevers and chills. She reports concerns regarding her low sodium and Lasix use. She relays a story of a mixup of losartan/hydrochlorothiazide at her pharmacy (was previously taking Lasix and HCTZ). She is followed by Dr. Harrington Challenger for cardiology, Dr. Sarajane Jews for primary care.   The patient continues to smoke 1-5 cigarettes per day. She wears her oxygen at night and occasionally with activity.  She is a retired Radio broadcast assistant (retired at age 41). The patient continues to drive and lives alone.  The patient requested pulmonary consultation.    PAST MEDICAL HISTORY :   has a past medical history of HIP PAIN; HYPERTENSION; Restless leg syndrome; COPD (chronic obstructive pulmonary disease); Peptic ulcer disease; Hyponatremia; Tobacco abuse; Hypomagnesemia; LBBB (left bundle branch block); Pulmonary HTN; Mitral regurgitation; Respiratory failure; Abnormal echocardiogram; Aortic stenosis; Pulmonary HTN; Wide-complex tachycardia; Chronic diastolic CHF (congestive heart failure); Diverticulitis of colon (without mention of hemorrhage) (Jill White/20/2015); Septic shock (12/11/2013); and Acute on chronic combined systolic and diastolic congestive heart failure, NYHA class 4 (11/27/2013).  has past surgical history that includes Abdominal hysterectomy; Dilation and curettage of uterus; Tonsillectomy; Esophagogastroduodenoscopy (07/09/2011); Cataract extraction w/ intraocular lens  implant, bilateral (Bilateral, 03/2013); left and right heart catheterization with coronary  angiogram (N/A, 12/02/2013); and Laparoscopic sigmoid colectomy (07-03-14).    Prior to Admission medications   Medication Sig Start Date End Date Taking? Authorizing Provider  ALPRAZolam Duanne Moron)  0.25 MG tablet Take 0.25 mg by mouth at bedtime as needed for anxiety.   Yes Historical Provider, MD  aspirin 81 MG tablet Take 81 mg by mouth every morning.    Yes Historical Provider, MD  budesonide-formoterol (SYMBICORT) 80-4.5 MCG/ACT inhaler Inhale Jill White puffs into the lungs Jill White (two) times daily. 12/31/14  Yes Brand Males, MD  cefdinir (OMNICEF) 300 MG capsule Take 1 capsule (300 mg total) by mouth Jill White (two) times daily. 01/28/15  Yes Tanda Rockers, MD  clonazePAM (KLONOPIN) 0.5 MG tablet TAKE TWO TABLET BY MOUTH EVERY AT BEDTIME 01/30/15  Yes Laurey Morale, MD  diltiazem (CARDIZEM CD) 120 MG 24 hr capsule Take 1 capsule (120 mg total) by mouth daily. 12/03/13  Yes Erick Colace, NP  furosemide (LASIX) 20 MG tablet Take 1.5 tablets (30 mg total) by mouth daily. 10/14/14  Yes Fay Records, MD  guaiFENesin (MUCINEX) 600 MG 12 hr tablet Take 600 mg by mouth Jill White (two) times daily.   Yes Historical Provider, MD  losartan (COZAAR) 50 MG tablet Take 1 tablet (50 mg total) by mouth daily. 03/06/14  Yes Fay Records, MD  magnesium oxide (MAG-OX) 400 MG tablet Take 1 tablet (400 mg total) by mouth daily. Jill White/11/16  Yes Scott T Kathlen Mody, PA-C  ondansetron (ZOFRAN) 4 MG tablet Take 4 mg by mouth every 4 (four) hours as needed for nausea or vomiting.   Yes Historical Provider, MD  potassium chloride (K-DUR) 10 MEQ tablet Take 1 tablet (10 mEq total) by mouth daily. 11/26/14  Yes Fay Records, MD  PROAIR HFA 108 (90 BASE) MCG/ACT inhaler INHALE TWO PUFFS BY MOUTH EVERY FOUR HOURS AS NEEDED Patient taking differently: INHALE TWO PUFFS BY MOUTH EVERY FOUR HOURS AS NEEDED shortness of breath/wheezing 10/31/14  Yes Laurey Morale, MD  Respiratory Therapy Supplies (FLUTTER) DEVI Use as directed 01/26/15  Yes Tanda Rockers, MD  tiotropium (SPIRIVA) 18 MCG inhalation capsule Place 1 capsule (18 mcg total) into inhaler and inhale daily. 12/03/13  Yes Erick Colace, NP  TraMADol HCl 50 MG TBDP Take 1 tablet by mouth every 6 (six)  hours as needed. Patient taking differently: Take Jill White tablets by mouth every 6 (six) hours as needed (pain).  05/22/14  Yes Barton Dubois, MD   Allergies  Allergen Reactions  . Tetanus Toxoid Anaphylaxis  . Codeine Nausea And Vomiting and Other (See Comments)    Itching and faint   . Erythromycin Nausea And Vomiting  . Gabapentin     REACTION: severe edema, nausea, dizziness, disorientation  . Levaquin [Levofloxacin In D5w] Other (See Comments)    Joint pain; (states she can take it IV- not by mouth)  . Meloxicam Itching  . Methylprednisolone Hives  . Penicillins Hives  . Prednisone Other (See Comments)    GI bleed    FAMILY HISTORY:  family history includes Anemia in her father; Dementia in her mother; Heart disease in her brother and sister.   SOCIAL HISTORY:  reports that she has been smoking Cigarettes.  She has smoked for the past 50 years. She has never used smokeless tobacco. She reports that she does not drink alcohol or use illicit drugs.  REVIEW OF SYSTEMS:   Constitutional: Negative for fever, chills, weight loss, malaise/fatigue and diaphoresis.  HENT: Negative for  hearing loss, ear pain, nosebleeds, congestion, sore throat, neck pain, tinnitus and ear discharge.   Eyes: Negative for blurred vision, double vision, photophobia, pain, discharge and redness.  Respiratory: Negative for hemoptysis, and stridor.  Reports cough, green sputum production, shortness of breath, wheezing Cardiovascular: Negative for chest pain, palpitations, orthopnea, claudication, leg swelling and PND.  Gastrointestinal: Negative for heartburn, nausea, vomiting, abdominal pain, diarrhea, constipation, blood in stool and melena.  Genitourinary: Negative for dysuria, urgency, frequency, hematuria and flank pain.  Musculoskeletal: Negative for myalgias, back pain, and falls. Reports chronic left foot pain Skin: Negative for itching and rash.  Neurological: Negative for dizziness, tingling, tremors,  sensory change, speech change, focal weakness, seizures, loss of consciousness, weakness and headaches.  Endo/Heme/Allergies: Negative for environmental allergies and polydipsia. Does not bruise/bleed easily.  SUBJECTIVE: Pt reports feeling better since admission, not back to baseline but improved.    VITAL SIGNS: Temp:  [98.Jill White F (36.8 C)-98.5 F (36.9 C)] 98.Jill White F (36.8 C) (08/17 0519) Pulse Rate:  [71-83] 71 (08/17 0519) Resp:  [20-22] 20 (08/17 0519) BP: (129-154)/(50-64) 154/64 mmHg (08/17 0519) SpO2:  [91 %-97 %] 96 % (08/17 0801)  PHYSICAL EXAMINATION: General:  Elderly female in NAD, Slightly worse facial edema. Neuro:  AAOx4,  No focal deficits HEENT: No JVD Cardiovascular:  S1, S2,  rrr, no m/r/g  Lungs:  Occasional wheezes, improved since yesterday. Abdomen:  Obese, soft, + BS Musculoskeletal:  No acute deformities Skin:  Warm/dry, mild R eye 1+ dependent edema    Recent Labs Lab 02/02/15 0515 02/03/15 0548 02/04/15 0520  NA 127* 133* 132*  K 3.8 4.3 4.1  CL 84* 95* 93*  CO2 34* 32 34*  BUN <5* <5* 5*  CREATININE 0.49 0.40* 0.45  GLUCOSE 92 88 88    Recent Labs Lab 02/01/15 2010 02/03/15 0548  HGB 11.9* 11.3*  HCT 35.Jill White* 34.9*  WBC 7.3 6.9  PLT 431* 396   Dg Chest Port 1 View  02/04/2015   CLINICAL DATA:  Shortness of breath.  EXAM: PORTABLE CHEST - 1 VIEW  COMPARISON:  02/01/2015.  07/01/2004.  FINDINGS: Mediastinum and hilar structures normal. Cardiomegaly with diffuse bilateral pulmonary interstitial prominence and bilateral effusions consistent with congestive heart failure. No pneumothorax.  IMPRESSION: Congestive heart failure with diffuse bilateral pulmonary interstitial edema and small bilateral pleural effusions .   Electronically Signed   By: Marcello Moores  Register   On: 02/04/2015 07:18    ASSESSMENT / PLAN:   COPD - with acute exacerbation.  Prior sinus infection before exacerbation.   Appears to be volume overloaded today as Lasix was held on  admission. CXR shows congestion Tobacco Abuse   Plan: Continue IV levaquin  Pulmonary hygiene:  IS, mobilize  Smoking cessation counseling  Continue symbicort, spiriva  Xopenex QID No steroids as pt is allergic (no significant bronchospasm on exam) Start diuresis with Lasix. Can start at 20 mg dose.   Marshell Garfinkel MD [facility] Pager [phone] If no answer or after 3pm call: [phone] 02/04/2015, 11:52 AM

## 2015-02-04 NOTE — Progress Notes (Signed)
PT Cancellation Note  Patient Details Name: AILIS RIGAUD MRN: [id number] DOB: 02/04/1941   Cancelled Treatment:    Reason Eval/Treat Not Completed: Fatigue/lethargy limiting ability to participate (pt refused PT and stated she needs to rest today, will follow. )   Philomena Doheny 02/04/2015, 10:22 AM [phone]

## 2015-02-04 NOTE — Progress Notes (Signed)
Progress Note   Jill White UDJ:[id number] DOB: 1941-01-26 DOA: 02/01/2015 PCP: Laurey Morale, MD   Brief Narrative:   Jill White is an 74 y.o. female with a PMH of COPD on home oxygen who was recently evaluated by Dr. Melvyn Novas 01/26/15 for evaluation of fever, shortness of breath and cough, treated with Levaquin with some improvement however Levaquin had to be discontinued secondary to joint pain so her antibiotics were switched to [facility] however her symptoms returned on this therapy, who was admitted 02/01/15 with worsening shortness of breath.  Assessment/Plan:   Principal Problem:   COPD exacerbation - Multiple allergies including steroids complicates treatment. - Risk of tendon rupture discussed with patient by admitting physician, patient agreed to proceed with treatment. - Unable to provide IV steroids or oral prednisone secondary to allergy. - Continue Spiriva, Symbicort, nebulized broncho-dilators, Mucinex and magnesium. - Patient requests pulmonology consult be called.  -improving, appreciated pulmonology input  Active Problems:   Restless Legs Syndrome - Given a one time dose of Mirapex yesterday, with improvement. - Mirapex PRN ordered.    Essential hypertension - Continue Cardizem and losartan.    Dehydration with hyponatremia - Hydrated. Sodium 123---> 127---> 133. KVO IV fluids. - The patient does have a history of baseline hyponatremia. - Lasix held initially, restarted on 02-24-2023, due to repeat cxr showed mild pleural effusion. - Discussed with patient the concept of taking Lasix on a PRN basis based on weight.    CAP (community acquired pneumonia) - Continue IV Levaquin. Chest x-ray showed a new small right pleural effusion, but no consolidation. - negative Legionella antigen, blood/sputum cultures no growth so far - Strep pneumonia antigen negative. HIV nonreactive. -fever subsided    Chronic systolic and diastolic CHF (congestive heart failure) - Watch  closely for signs of volume overload while on IV fluids (IVF rate decreased today). - EF 45-50 percent on Echo done 11/23/13. -restarted lasix on 2023-02-24    Chronic respiratory failure with hypercapnia - Continue supplemental oxygen as needed. Treat COPD/CAP as outlined above. -on home oxygen at night    DVT Prophylaxis - Lovenox ordered.  Family Communication: patient Disposition Plan: Home when pneumonia treated & patient able to tolerate activity, 1-2 days if OK with pulmonary.  Lives alone. Reported baseline very independent.  Code Status:     Code Status Orders        Start     Ordered   02/01/15 2225  Full code   Continuous     02/01/15 2226    Advance Directive Documentation        Most Recent Value   Type of Advance Directive  Living will   Pre-existing out of facility DNR order (yellow form or pink MOST form)     "MOST" Form in Place?          IV Access:    Peripheral IV   Procedures and diagnostic studies:   Dg Chest Port 1 View  02/24/2015   CLINICAL DATA:  Shortness of breath.  EXAM: PORTABLE CHEST - 1 VIEW  COMPARISON:  02/01/2015.  07/01/2004.  FINDINGS: Mediastinum and hilar structures normal. Cardiomegaly with diffuse bilateral pulmonary interstitial prominence and bilateral effusions consistent with congestive heart failure. No pneumothorax.  IMPRESSION: Congestive heart failure with diffuse bilateral pulmonary interstitial edema and small bilateral pleural effusions .   Electronically Signed   By: Marcello Moores  Register   On: 02/24/15 07:18     Medical Consultants:    Pulmonology  Anti-Infectives:    Levaquin 02/01/15--->  Subjective:   Jill White feels better.  Feel mild fluids overloads, requesting lasix. Coughing less.  Objective:    Filed Vitals:   02/04/15 0801 02/04/15 1131 02/04/15 1442 02/04/15 1534  BP:   123/68   Pulse:   73   Temp:   98.2 F (36.8 C)   TempSrc:   Oral   Resp:   21   Height:      Weight:      SpO2: 96%  94% 95% 92%    Intake/Output Summary (Last 24 hours) at 02/04/15 1629 Last data filed at 02/04/15 1300  Gross per 24 hour  Intake    720 ml  Output      0 ml  Net    720 ml    Exam: Gen:  NAD, irritable Cardiovascular:  RRR, No M/R/G Respiratory:  Lungs diminished with expiratory wheezes Gastrointestinal:  Abdomen soft, NT/ND, + BS Extremities:  No C/E/C   Data Reviewed:    Labs: Basic Metabolic Panel:  Recent Labs Lab 02/01/15 2010 02/02/15 0515 02/03/15 0548 02/04/15 0520  NA 123* 127* 133* 132*  K 4.4 3.8 4.3 4.1  CL 79* 84* 95* 93*  CO2 35* 34* 32 34*  GLUCOSE 109* 92 88 88  BUN 5* <5* <5* 5*  CREATININE 0.56 0.49 0.40* 0.45  CALCIUM 9.2 8.7* 8.4* 8.2*   GFR Estimated Creatinine Clearance: 49.7 mL/min (by C-G formula based on Cr of 0.45). Liver Function Tests: No results for input(s): AST, ALT, ALKPHOS, BILITOT, PROT, ALBUMIN in the last 168 hours. No results for input(s): LIPASE, AMYLASE in the last 168 hours. No results for input(s): AMMONIA in the last 168 hours. Coagulation profile No results for input(s): INR, PROTIME in the last 168 hours.  CBC:  Recent Labs Lab 02/01/15 2010 02/03/15 0548  WBC 7.3 6.9  NEUTROABS 4.4  --   HGB 11.9* 11.3*  HCT 35.2* 34.9*  MCV 92.1 94.6  PLT 431* 396   Cardiac Enzymes:  Recent Labs Lab 02/01/15 2010  TROPONINI <0.03   BNP (last 3 results)  Recent Labs  03/26/14 1005 05/12/14 1316 05/14/14 0533  PROBNP 94.0 577.4* 1351.0*   Microbiology Recent Results (from the past 240 hour(s))  Culture, blood (routine x 2) Call MD if unable to obtain prior to antibiotics being given     Status: None (Preliminary result)   Collection Time: 02/01/15 11:00 PM  Result Value Ref Range Status   Specimen Description BLOOD RIGHT ANTECUBITAL  Final   Special Requests BOTTLES DRAWN AEROBIC AND ANAEROBIC 5ML  Final   Culture   Final    NO GROWTH 2 DAYS Performed at [facility]    Report Status PENDING   Incomplete  Culture, blood (routine x 2) Call MD if unable to obtain prior to antibiotics being given     Status: None (Preliminary result)   Collection Time: 02/01/15 11:05 PM  Result Value Ref Range Status   Specimen Description BLOOD LEFT HAND  Final   Special Requests BOTTLES DRAWN AEROBIC AND ANAEROBIC 5ML  Final   Culture   Final    NO GROWTH 2 DAYS Performed at [facility]    Report Status PENDING  Incomplete  Culture, sputum-assessment     Status: None   Collection Time: 02/02/15  6:10 PM  Result Value Ref Range Status   Specimen Description SPUTUM  Final   Special Requests NONE  Final   Sputum evaluation  Final    MICROSCOPIC FINDINGS SUGGEST THAT THIS SPECIMEN IS NOT REPRESENTATIVE OF LOWER RESPIRATORY SECRETIONS. PLEASE RECOLLECT. RESULTS CALLED TO, READ BACK BY  AND VERIFIED  WITH Ennis Forts [id number] @ [facility]    Report Status 02/02/2015 FINAL  Final  Culture, expectorated sputum-assessment     Status: None   Collection Time: 02/03/15  8:28 AM  Result Value Ref Range Status   Specimen Description SPUTUM  Final   Special Requests NONE  Final   Sputum evaluation   Final    THIS SPECIMEN IS ACCEPTABLE. RESPIRATORY CULTURE REPORT TO FOLLOW.   Report Status 02/03/2015 FINAL  Final  Culture, respiratory (NON-Expectorated)     Status: None (Preliminary result)   Collection Time: 02/03/15  8:35 AM  Result Value Ref Range Status   Specimen Description SPUTUM  Final   Special Requests NONE  Final   Gram Stain   Final    FEW WBC PRESENT,BOTH PMN AND MONONUCLEAR FEW SQUAMOUS EPITHELIAL CELLS PRESENT FEW GRAM POSITIVE COCCI IN PAIRS Performed at [facility]    Culture   Final    NORMAL OROPHARYNGEAL FLORA Performed at [facility]    Report Status PENDING  Incomplete     Medications:   . aspirin  81 mg Oral q morning - 10a  . budesonide-formoterol  2 puff Inhalation BID  . diltiazem  120 mg Oral Daily  . enoxaparin (LOVENOX)  injection  40 mg Subcutaneous QHS  . furosemide  20 mg Oral Daily  . guaiFENesin  600 mg Oral BID  . levalbuterol  0.63 mg Nebulization QID  . levofloxacin (LEVAQUIN) IV  750 mg Intravenous Q24H  . losartan  50 mg Oral Daily  . magnesium oxide  400 mg Oral Daily  . tiotropium  18 mcg Inhalation Daily   Continuous Infusions: . sodium chloride 10 mL/hr at 02/03/15 1105    Time spent: 35 minutes with > 50% of time discussing current diagnostic test results, clinical impression and plan of care.   LOS: 3 days   Kamryn Gauthier MD PhD  [facility] Pager [phone].   *Please refer to [email or web address], password [email or web address] to get updated schedule on who will round on this patient, as hospitalists switch teams weekly. If 7PM-7AM, please contact night-coverage at [email or web address], password [email or web address] for any overnight needs.  02/04/2015, 4:29 PM

## 2015-02-05 LAB — CULTURE, RESPIRATORY W GRAM STAIN: Culture: NORMAL

## 2015-02-05 LAB — BASIC METABOLIC PANEL
ANION GAP: 7 (ref 5–15)
BUN: 6 mg/dL (ref 6–20)
CO2: 40 mmol/L — ABNORMAL HIGH (ref 22–32)
Calcium: 8.4 mg/dL — ABNORMAL LOW (ref 8.9–10.3)
Chloride: 86 mmol/L — ABNORMAL LOW (ref 101–111)
Creatinine, Ser: 0.58 mg/dL (ref 0.44–1.00)
Glucose, Bld: 127 mg/dL — ABNORMAL HIGH (ref 65–99)
POTASSIUM: 3.9 mmol/L (ref 3.5–5.1)
SODIUM: 133 mmol/L — AB (ref 135–145)

## 2015-02-05 LAB — MAGNESIUM: Magnesium: 1.7 mg/dL (ref 1.7–2.4)

## 2015-02-05 LAB — CULTURE, RESPIRATORY

## 2015-02-05 MED ORDER — CLONAZEPAM 0.5 MG PO TABS
0.2500 mg | ORAL_TABLET | Freq: Every evening | ORAL | Status: DC | PRN
  Administered 2015-02-05: 0.25 mg via ORAL
  Filled 2015-02-05: qty 1

## 2015-02-05 MED ORDER — LEVALBUTEROL HCL 0.63 MG/3ML IN NEBU
0.6300 mg | INHALATION_SOLUTION | Freq: Three times a day (TID) | RESPIRATORY_TRACT | Status: DC
  Administered 2015-02-05 – 2015-02-06 (×4): 0.63 mg via RESPIRATORY_TRACT
  Filled 2015-02-05 (×4): qty 3

## 2015-02-05 NOTE — Progress Notes (Addendum)
Progress Note   Jill White:[id number] DOB: [birth year] DOA: 02/01/2015 PCP: Laurey Morale, MD   Brief Narrative:   Jill White is an 74 y.o. female with a PMH of COPD on home oxygen who was recently evaluated by Dr. Melvyn Novas 01/26/15 for evaluation of fever, shortness of breath and cough, treated with Levaquin with some improvement however Levaquin had to be discontinued secondary to joint pain so her antibiotics were switched to [facility] however her symptoms returned on this therapy, who was admitted 02/01/15 with worsening shortness of breath.  Assessment/Plan:   Principal Problem:   COPD exacerbation - particular about meds, reported not able to tolerate oral levaquin but ok with iv levaquin, reported allergic to multiple meds including steroids. - Risk of tendon rupture discussed with patient by admitting physician, patient agreed to proceed with treatment. - Unable to provide IV steroids or oral prednisone secondary to allergy. - Continue Spiriva, Symbicort, nebulized broncho-dilators, Mucinex and magnesium. - Patient requests pulmonology consult be called.  -improving, appreciated pulmonology input -oxygen saturation dropped to 80's at rest on room air, and ambulating on 2liters  Active Problems:   Restless Legs Syndrome - Given a one time dose of Mirapex yesterday, with improvement. - Mirapex PRN ordered.    Essential hypertension - Continue Cardizem and losartan.    Dehydration with hyponatremia - Hydrated. Sodium 123---> 127---> 133. KVO IV fluids. - The patient does have a history of baseline hyponatremia. - Lasix held initially, restarted on 2023/02/19, due to repeat cxr showed mild pleural effusion. - Discussed with patient the concept of taking Lasix on a PRN basis based on weight.    CAP (community acquired pneumonia) - Continue IV Levaquin. Chest x-ray showed a new small right pleural effusion, but no consolidation. - negative Legionella antigen, blood/sputum  cultures no growth so far - Strep pneumonia antigen negative. HIV nonreactive. -fever subsided    Chronic systolic and diastolic CHF (congestive heart failure) - Watch closely for signs of volume overload while on IV fluids (IVF rate decreased today). - EF 45-50 percent on Echo done 11/23/13. -restarted lasix on 02-19-23    Chronic respiratory failure with hypercapnia - Continue supplemental oxygen as needed. Treat COPD/CAP as outlined above. -on home oxygen at night    DVT Prophylaxis - Lovenox ordered.  Family Communication: patient Disposition Plan:  Lives alone. Reported baseline very independent. Home when ok with pulmonology Code Status:     Code Status Orders        Start     Ordered   02/01/15 2225  Full code   Continuous     02/01/15 2226    Advance Directive Documentation        Most Recent Value   Type of Advance Directive  Living will   Pre-existing out of facility DNR order (yellow form or pink MOST form)     "MOST" Form in Place?          IV Access:    Peripheral IV   Procedures and diagnostic studies:   Dg Chest Port 1 View  February 19, 2015   CLINICAL DATA:  Shortness of breath.  EXAM: PORTABLE CHEST - 1 VIEW  COMPARISON:  02/01/2015.  07/01/2004.  FINDINGS: Mediastinum and hilar structures normal. Cardiomegaly with diffuse bilateral pulmonary interstitial prominence and bilateral effusions consistent with congestive heart failure. No pneumothorax.  IMPRESSION: Congestive heart failure with diffuse bilateral pulmonary interstitial edema and small bilateral pleural effusions .   Electronically Signed   By:  [place]   On: 02/04/2015 07:18     Medical Consultants:    Pulmonology  Anti-Infectives:    Levaquin 02/01/15--->  Subjective:   Jill White reported chronic intermittent nausea, wanting to check if any of her meds make her nauseated, she states it is not due to levaquin, reported left arm iv infiltrated, concerned about developing  cellulitis,  Reported not dependent on oxygen daytime at home, currnetly still requiring 2liter oxygen, Coughing less, no lower extremity edema  Objective:    Filed Vitals:   02/04/15 2010 02/04/15 2139 02/05/15 0536 02/05/15 0908  BP:  123/49 124/47   Pulse:  83 82 85  Temp:  98.7 F (37.1 C) 98.4 F (36.9 C)   TempSrc:  Oral Oral   Resp:  20 20 18   Height:      Weight:      SpO2: 93% 92% 92% 92%    Intake/Output Summary (Last 24 hours) at 02/05/15 1159 Last data filed at 02/05/15 0837  Gross per 24 hour  Intake    720 ml  Output      0 ml  Net    720 ml    Exam: Gen:  NAD, irritable Cardiovascular:  RRR, No M/R/G Respiratory:  Lungs diminished with expiratory wheezes, crackles at bases Gastrointestinal:  Abdomen soft, NT/ND, + BS Extremities:  Mild edema/erythema, Left arm antecubital area   Data Reviewed:    Labs: Basic Metabolic Panel:  Recent Labs Lab 02/01/15 2010 02/02/15 0515 02/03/15 0548 02/04/15 0520  NA 123* 127* 133* 132*  K 4.4 3.8 4.3 4.1  CL 79* 84* 95* 93*  CO2 35* 34* 32 34*  GLUCOSE 109* 92 88 88  BUN 5* <5* <5* 5*  CREATININE 0.56 0.49 0.40* 0.45  CALCIUM 9.2 8.7* 8.4* 8.2*   GFR Estimated Creatinine Clearance: 49.7 mL/min (by C-G formula based on Cr of 0.45). Liver Function Tests: No results for input(s): AST, ALT, ALKPHOS, BILITOT, PROT, ALBUMIN in the last 168 hours. No results for input(s): LIPASE, AMYLASE in the last 168 hours. No results for input(s): AMMONIA in the last 168 hours. Coagulation profile No results for input(s): INR, PROTIME in the last 168 hours.  CBC:  Recent Labs Lab 02/01/15 2010 02/03/15 0548  WBC 7.3 6.9  NEUTROABS 4.4  --   HGB 11.9* 11.3*  HCT 35.2* 34.9*  MCV 92.1 94.6  PLT 431* 396   Cardiac Enzymes:  Recent Labs Lab 02/01/15 2010  TROPONINI <0.03   BNP (last 3 results)  Recent Labs  03/26/14 1005 05/12/14 1316 05/14/14 0533  PROBNP 94.0 577.4* 1351.0*   Microbiology Recent  Results (from the past 240 hour(s))  Culture, blood (routine x 2) Call MD if unable to obtain prior to antibiotics being given     Status: None (Preliminary result)   Collection Time: 02/01/15 11:00 PM  Result Value Ref Range Status   Specimen Description BLOOD RIGHT ANTECUBITAL  Final   Special Requests BOTTLES DRAWN AEROBIC AND ANAEROBIC 5ML  Final   Culture   Final    NO GROWTH 2 DAYS Performed at [facility]    Report Status PENDING  Incomplete  Culture, blood (routine x 2) Call MD if unable to obtain prior to antibiotics being given     Status: None (Preliminary result)   Collection Time: 02/01/15 11:05 PM  Result Value Ref Range Status   Specimen Description BLOOD LEFT HAND  Final   Special Requests BOTTLES DRAWN AEROBIC AND ANAEROBIC  5ML  Final   Culture   Final    NO GROWTH 2 DAYS Performed at [facility]    Report Status PENDING  Incomplete  Culture, sputum-assessment     Status: None   Collection Time: 02/02/15  6:10 PM  Result Value Ref Range Status   Specimen Description SPUTUM  Final   Special Requests NONE  Final   Sputum evaluation   Final    MICROSCOPIC FINDINGS SUGGEST THAT THIS SPECIMEN IS NOT REPRESENTATIVE OF LOWER RESPIRATORY SECRETIONS. PLEASE RECOLLECT. RESULTS CALLED TO, READ BACK BY  AND VERIFIED  WITH Ennis Forts [id number] @ [place]    Report Status 02/02/2015 FINAL  Final  Culture, expectorated sputum-assessment     Status: None   Collection Time: 02/03/15  8:28 AM  Result Value Ref Range Status   Specimen Description SPUTUM  Final   Special Requests NONE  Final   Sputum evaluation   Final    THIS SPECIMEN IS ACCEPTABLE. RESPIRATORY CULTURE REPORT TO FOLLOW.   Report Status 02/03/2015 FINAL  Final  Culture, respiratory (NON-Expectorated)     Status: None   Collection Time: 02/03/15  8:35 AM  Result Value Ref Range Status   Specimen Description SPUTUM  Final   Special Requests NONE  Final   Gram Stain   Final    FEW WBC  PRESENT,BOTH PMN AND MONONUCLEAR FEW SQUAMOUS EPITHELIAL CELLS PRESENT FEW GRAM POSITIVE COCCI IN PAIRS Performed at [facility]    Culture   Final    NORMAL OROPHARYNGEAL FLORA Performed at [facility]    Report Status 02/05/2015 FINAL  Final     Medications:   . aspirin  81 mg Oral q morning - 10a  . budesonide-formoterol  2 puff Inhalation BID  . diltiazem  120 mg Oral Daily  . enoxaparin (LOVENOX) injection  40 mg Subcutaneous QHS  . furosemide  20 mg Oral Daily  . guaiFENesin  600 mg Oral BID  . levalbuterol  0.63 mg Nebulization TID  . levofloxacin (LEVAQUIN) IV  750 mg Intravenous Q24H  . losartan  50 mg Oral Daily  . magnesium oxide  400 mg Oral Daily  . tiotropium  18 mcg Inhalation Daily   Continuous Infusions: . sodium chloride 10 mL/hr at 02/03/15 1105    Time spent: 35 minutes with > 50% of time discussing current diagnostic test results, clinical impression and plan of care.   LOS: 4 days   Leanette Eutsler MD PhD  [facility] Pager [phone].   *Please refer to [email or web address], password [email or web address] to get updated schedule on who will round on this patient, as hospitalists switch teams weekly. If 7PM-7AM, please contact night-coverage at [email or web address], password [email or web address] for any overnight needs.  02/05/2015, 11:59 AM

## 2015-02-05 NOTE — Progress Notes (Signed)
ANTIBIOTIC CONSULT NOTE  Pharmacy Consult for Levaquin Indication: CAP  Allergies  Allergen Reactions  . Tetanus Toxoid Anaphylaxis  . Codeine Nausea And Vomiting and Other (See Comments)    Itching and faint   . Erythromycin Nausea And Vomiting  . Gabapentin     REACTION: severe edema, nausea, dizziness, disorientation  . Levaquin [Levofloxacin In D5w] Other (See Comments)    Joint pain; (states she can take it IV- not by mouth)  . Meloxicam Itching  . Methylprednisolone Hives  . Penicillins Hives  . Prednisone Other (See Comments)    GI bleed    Patient Measurements: Height: 5\' 1"  (154.9 cm) Weight: 132 lb (59.875 kg) IBW/kg (Calculated) : 47.8   Vital Signs: Temp: 98.4 F (36.9 C) (08/18 0536) Temp Source: Oral (08/18 0536) BP: 124/47 mmHg (08/18 0536) Pulse Rate: 85 (08/18 0908) Intake/Output from previous day: 08/17 0701 - 08/18 0700 In: 720 [P.O.:720] Out: -   Labs:  Recent Labs  02/03/15 0548 02/04/15 0520  WBC 6.9  --   HGB 11.3*  --   PLT 396  --   CREATININE 0.40* 0.45   Estimated Creatinine Clearance: 49.7 mL/min (by C-G formula based on Cr of 0.45).   Assessment: 73 yr female with recent cough/SOB/fever and productive sputum.  Initially placed on oral levaquin, which caused joint pain, and was switched to omnicef.  Patient presents with reports of worsening shortness of breath and sputum.  She was given Levaquin 500mg  IV in the ED (which she tolerated well).  Pharmacy consulted to continue dosing of Levaquin IV for CAP.  8/14 >>Levaquin >>   Today, 02/05/2015:  Tm afebrile  WBCs WNL  Renal: SCr WNL  8/14 blood x 2: NGTD 8/16 sputum: normal flora  Goal of Therapy:  Appropriate abx dosing, eradication of infection.   Plan:  Day #4 levofloxacin  Levaquin 750mg  IV q24h  Follow up renal fxn, culture results, and clinical course.  No adjustment needed at this time  Doreene Eland, PharmD, BCPS.   Pager: [phone] 02/05/2015 10:08  AM

## 2015-02-05 NOTE — Progress Notes (Signed)
SATURATION QUALIFICATIONS: (This note is used to comply with regulatory documentation for home oxygen)  Patient Saturations on Room Air at Rest = 83%  Patient Saturations on Room Air while Ambulating = 79%  Patient Saturations on 2 Liters of oxygen while Ambulating = 81%  Please briefly explain why patient needs home oxygen: Decreased O2 saturations at rest and while ambulating  Elenna Spratling, [place]bee [place], RN

## 2015-02-05 NOTE — Progress Notes (Signed)
Name: Jill White MRN: [id number] DOB: 03/05/1941    ADMISSION DATE:  02/01/2015 CONSULTATION DATE:  02/03/15  REFERRING MD :  Dr. Rockne Menghini   CHIEF COMPLAINT:  AECOPD /   BRIEF PATIENT DESCRIPTION: [name], current smoker, with PMH of COPD, PAH, LBBB, chronic diastolic CHF who presented to [facility] on 8/14 with worsening SOB.  The patient was recently seen for the sam and treated with PO abx without improvement.  PCCM consulted 8/16 for evaluation.    SIGNIFICANT EVENTS  8/14  Admit with AECOPD, small R pleural effusion  STUDIES:  8/14  CXR >> COPD, no acute infiltrate 8/16 CXR >> worsening CHF and pleural effusion.  HISTORY OF PRESENT ILLNESS:  [name], smoker, with PMH of HTN, LBBB, chronic respiratory failure, pulmonary hypertension, MR/AS, chronic diastolic CHF, chronic left ankle joint pain (S/P cortisone injections), diverticulitis and PUD who presented to [facility] on 8/14 with increasing SOB.    The patient was seen June 21 for acute sinusitis and was treated with a Z-Pak at that time.  After that episode she feels as though congestion settled in her chest. She was recently seen in the Pulmonary office on 8/8 for 2 day history of chest congestion, cough with productive green sputum and shortness of breath.  She was treated with antibiotics at that time for an acute exacerbation of COPD.  Initially she was treated with levaquin but had pain in the tendons in her left foot and abx were changed (Omnicef).  Respiratory symptoms worsened after antibiotic change and patient reported to the ER for evaluation.  She was admitted to the hospital per [facility] for further evaluation.  Initial chest xray showed findings consistent with COPD, and a new small R pleural effusion. No evidence of acute infiltrate.  WBC 7.3, Hgb 11.3, platelets 431, Na 123, K 4.4, and sr cr 0.56.  The patient was treated with bronchodilators & empiric abx for COPD / possible CAP.  Urine strep antigen assessed and negative.  Patient  reports she can take IV Levaquin but not oral and she can have IV steroid-induced but not oral prednisone due to allergies.  Since admission she reports improvement in symptoms. Sputum has changed from green to clear. Her shortness of breath has improved. She currently denies fevers and chills. She reports concerns regarding her low sodium and Lasix use. She relays a story of a mixup of losartan/hydrochlorothiazide at her pharmacy (was previously taking Lasix and HCTZ). She is followed by Dr. Harrington Challenger for cardiology, Dr. Sarajane Jews for primary care.   The patient continues to smoke 1-5 cigarettes per day. She wears her oxygen at night and occasionally with activity.  She is a retired Radio broadcast assistant (retired at age 47). The patient continues to drive and lives alone.  The patient requested pulmonary consultation.    PAST MEDICAL HISTORY :   has a past medical history of HIP PAIN; HYPERTENSION; Restless leg syndrome; COPD (chronic obstructive pulmonary disease); Peptic ulcer disease; Hyponatremia; Tobacco abuse; Hypomagnesemia; LBBB (left bundle branch block); Pulmonary HTN; Mitral regurgitation; Respiratory failure; Abnormal echocardiogram; Aortic stenosis; Pulmonary HTN; Wide-complex tachycardia; Chronic diastolic CHF (congestive heart failure); Diverticulitis of colon (without mention of hemorrhage) (08/09/2013); Septic shock (12/11/2013); and Acute on chronic combined systolic and diastolic congestive heart failure, NYHA class 4 (11/27/2013).  has past surgical history that includes Abdominal hysterectomy; Dilation and curettage of uterus; Tonsillectomy; Esophagogastroduodenoscopy (07/09/2011); Cataract extraction w/ intraocular lens  implant, bilateral (Bilateral, 03/2013); left and right heart catheterization with coronary  angiogram (N/A, 12/02/2013); and Laparoscopic sigmoid colectomy (07-03-14).    SUBJECTIVE: [place]  VITAL SIGNS: Temp:  [98.2 F (36.8 C)-98.7 F (37.1 C)] 98.4 F (36.9 C) (08/18 0536) Pulse Rate:   [73-85] 85 (08/18 0908) Resp:  [18-21] 18 (08/18 0908) BP: (123-124)/(47-68) 124/47 mmHg (08/18 0536) SpO2:  [92 %-95 %] 92 % (08/18 0908)  PHYSICAL EXAMINATION: General:  Elderly female in NAD, Speech clear Neuro:  AAOx4,  No focal deficits HEENT: No JVD Cardiovascular:  S1, S2,  rrr, no m/r/g  Lungs:  Decreased air movement Abdomen:   soft, + BS Musculoskeletal:  No acute deformities Skin:  Warm/dry, mild R eye 1+ dependent edema    Recent Labs Lab 02/02/15 0515 02/03/15 0548 02/04/15 0520  NA 127* 133* 132*  K 3.8 4.3 4.1  CL 84* 95* 93*  CO2 34* 32 34*  BUN <5* <5* 5*  CREATININE 0.49 0.40* 0.45  GLUCOSE 92 88 88    Recent Labs Lab 02/01/15 2010 02/03/15 0548  HGB 11.9* 11.3*  HCT 35.2* 34.9*  WBC 7.3 6.9  PLT 431* 396   Dg Chest Port 1 View  02/04/2015   CLINICAL DATA:  Shortness of breath.  EXAM: PORTABLE CHEST - 1 VIEW  COMPARISON:  02/01/2015.  07/01/2004.  FINDINGS: Mediastinum and hilar structures normal. Cardiomegaly with diffuse bilateral pulmonary interstitial prominence and bilateral effusions consistent with congestive heart failure. No pneumothorax.  IMPRESSION: Congestive heart failure with diffuse bilateral pulmonary interstitial edema and small bilateral pleural effusions .   Electronically Signed   By: Marcello Moores  Register   On: 02/04/2015 07:18    Intake/Output Summary (Last 24 hours) at 02/05/15 0926 Last data filed at 02/05/15 0837  Gross per 24 hour  Intake    960 ml  Output      0 ml  Net    960 ml    ASSESSMENT / PLAN:   COPD - with acute exacerbation.  Prior sinus infection before exacerbation.   Appears to be volume overloaded today as Lasix was held on admission. CXR shows congestion Tobacco Abuse   Plan: Continue IV levaquin  Pulmonary hygiene:  IS, mobilize  Smoking cessation counseling  Continue symbicort, spiriva  Xopenex QID No steroids as pt is allergic (no significant bronchospasm on exam) Start diuresis with Lasix. Can  start at 20 mg dose. Note +i/o  Richardson Landry Minor ACNP Maryanna Shape PCCM Pager [phone] till 3 pm If no answer page [phone] 02/05/2015, 9:26 AM

## 2015-02-05 NOTE — Progress Notes (Signed)
PT Cancellation Note  Patient Details Name: Jill White MRN: [id number] DOB: 11/05/1940   Cancelled Treatment:    Reason Eval/Treat Not Completed: PT screened, no needs identified, will sign off Pt refused.  Pt states she has no needs and reports she is doing well at home.  PT to sign off.   Violia Knopf,KATHrine E 02/05/2015, 9:40 AM Carmelia Bake, PT, DPT 02/05/2015 Pager: [phone]

## 2015-02-05 NOTE — Care Management Note (Signed)
Case Management Note  Patient Details  Name: Jill White MRN: [id number] Date of Birth: [birth year]  Subjective/Objective:                   Fever/SOB/COPD exacerbation Action/Plan: Discharge planning  Expected Discharge Date:                  Expected Discharge Plan:  [place]  In-House Referral:     Discharge planning Services  CM Consult  Post Acute Care Choice:    Choice offered to:     DME Arranged:  Oxygen DME Agency:  [place]:    [place] Agency:     Status of Service:  In process, will continue to follow  Medicare Important Message Given:  Yes-second notification given Date Medicare IM Given:    Medicare IM give by:    Date Additional Medicare IM Given:    Additional Medicare Important Message give by:     If discussed at [place] of Stay Meetings, dates discussed:    Additional Comments: CM notes pt active with [facility] for home oxygen.  CM will follow for discharge needs. Dellie Catholic, RN 02/05/2015, 3:39 PM

## 2015-02-06 ENCOUNTER — Inpatient Hospital Stay (HOSPITAL_COMMUNITY): Payer: Medicare Other

## 2015-02-06 DIAGNOSIS — G2581 Restless legs syndrome: Secondary | ICD-10-CM

## 2015-02-06 DIAGNOSIS — R609 Edema, unspecified: Secondary | ICD-10-CM

## 2015-02-06 DIAGNOSIS — J9612 Chronic respiratory failure with hypercapnia: Secondary | ICD-10-CM

## 2015-02-06 DIAGNOSIS — I10 Essential (primary) hypertension: Secondary | ICD-10-CM

## 2015-02-06 MED ORDER — FUROSEMIDE 10 MG/ML IJ SOLN
40.0000 mg | Freq: Once | INTRAMUSCULAR | Status: AC
  Administered 2015-02-06: 40 mg via INTRAVENOUS
  Filled 2015-02-06: qty 4

## 2015-02-06 MED ORDER — NICOTINE 21 MG/24HR TD PT24
21.0000 mg | MEDICATED_PATCH | Freq: Every day | TRANSDERMAL | Status: DC
  Filled 2015-02-06: qty 1

## 2015-02-06 MED ORDER — FLORANEX PO TABS: 1.0000 | ORAL_TABLET | Freq: Three times a day (TID) | ORAL | Status: DC

## 2015-02-06 MED ORDER — ALBUTEROL SULFATE (2.5 MG/3ML) 0.083% IN NEBU: INHALATION_SOLUTION | RESPIRATORY_TRACT | Status: DC

## 2015-02-06 MED ORDER — PROMETHAZINE HCL 12.5 MG PO TABS: 12.5000 mg | ORAL_TABLET | Freq: Four times a day (QID) | ORAL | Status: DC | PRN

## 2015-02-06 MED ORDER — IPRATROPIUM-ALBUTEROL 0.5-2.5 (3) MG/3ML IN SOLN: 3.0000 mL | Freq: Four times a day (QID) | RESPIRATORY_TRACT | Status: DC | PRN

## 2015-02-06 MED ORDER — IPRATROPIUM BROMIDE 0.02 % IN SOLN: 0.5000 mg | RESPIRATORY_TRACT | Status: DC | PRN

## 2015-02-06 MED ORDER — ALBUTEROL SULFATE (2.5 MG/3ML) 0.083% IN NEBU: 2.5000 mg | INHALATION_SOLUTION | RESPIRATORY_TRACT | Status: DC | PRN

## 2015-02-06 MED ORDER — CLONAZEPAM 0.5 MG PO TABS: 0.2500 mg | ORAL_TABLET | Freq: Every evening | ORAL | Status: DC | PRN

## 2015-02-06 MED ORDER — PRAMIPEXOLE DIHYDROCHLORIDE 0.125 MG PO TABS: 0.1250 mg | ORAL_TABLET | Freq: Three times a day (TID) | ORAL | Status: DC

## 2015-02-06 MED ORDER — DOXYCYCLINE HYCLATE 100 MG PO CAPS: 100.0000 mg | ORAL_CAPSULE | Freq: Two times a day (BID) | ORAL | Status: DC

## 2015-02-06 MED ORDER — IPRATROPIUM BROMIDE 0.02 % IN SOLN
RESPIRATORY_TRACT | Status: DC
Start: 2015-02-06 — End: 2015-02-06

## 2015-02-06 MED ORDER — IPRATROPIUM BROMIDE 0.02 % IN SOLN: RESPIRATORY_TRACT | Status: DC

## 2015-02-06 MED ORDER — FUROSEMIDE 20 MG PO TABS
30.0000 mg | ORAL_TABLET | Freq: Every day | ORAL | Status: DC
  Filled 2015-02-06: qty 1.5

## 2015-02-06 NOTE — Progress Notes (Signed)
[facility]  [facility] is providing the following services: Rec'd order for nebulizer.  Patient did not want to wait for delivery to hospital room.  Informed the case manager that the patient can either pick the neb up at our retail store, or we can ship it out.  Rec'd message back from case manager stating that patient was okay with the neb being shipped to the home.    If patient discharges after hours, please call [phone].   Jill White 02/06/2015, 5:49 PM

## 2015-02-06 NOTE — Care Management Important Message (Signed)
Important Message  Patient Details  Name: Jill White MRN: [id number] Date of Birth: [birth year]   Medicare Important Message Given:  Yes-third notification given    Camillo Flaming 02/06/2015, 11:55 AMImportant Message  Patient Details  Name: Jill White MRN: [id number] Date of Birth: 1941-04-06   Medicare Important Message Given:  Yes-third notification given    Camillo Flaming 02/06/2015, 11:55 AM

## 2015-02-06 NOTE — Progress Notes (Signed)
VASCULAR LAB PRELIMINARY  PRELIMINARY  PRELIMINARY  PRELIMINARY  Left upper extremity venous duplex completed.    Preliminary report:  .No evidence of DVT involving the left upper extremity. There is a superficial thrombosis of the basilic vein at the level of the elbow and distal upper arm  Philopater Mucha, RVS 02/06/2015, 1:40 PM

## 2015-02-06 NOTE — Progress Notes (Signed)
IV lasix 40mg  ordered, MD stated to hold PO scheduled lasix. Kizzie Ide, RN

## 2015-02-06 NOTE — Progress Notes (Signed)
Name: Jill White MRN: [id number] DOB: 1940/10/27    ADMISSION DATE:  02/01/2015 CONSULTATION DATE:  02/03/15  REFERRING MD :  Dr. Rockne Menghini   CHIEF COMPLAINT:  AECOPD /   BRIEF PATIENT DESCRIPTION: [name], current smoker, with PMH of COPD, PAH, LBBB, chronic diastolic CHF who presented to [facility] on 8/14 with worsening SOB.  The patient was recently seen for the sam and treated with PO abx without improvement.  PCCM consulted 8/16 for evaluation.    SIGNIFICANT EVENTS  8/14  Admit with AECOPD, small R pleural effusion  STUDIES:  8/14  CXR >> COPD, no acute infiltrate 8/16 CXR >> worsening CHF and pleural effusion.  HISTORY OF PRESENT ILLNESS:  [name], smoker, with PMH of HTN, LBBB, chronic respiratory failure, pulmonary hypertension, MR/AS, chronic diastolic CHF, chronic left ankle joint pain (S/P cortisone injections), diverticulitis and PUD who presented to [facility] on 8/14 with increasing SOB.    The patient was seen June 21 for acute sinusitis and was treated with a Z-Pak at that time.  After that episode she feels as though congestion settled in her chest. She was recently seen in the Pulmonary office on 8/8 for 2 day history of chest congestion, cough with productive green sputum and shortness of breath.  She was treated with antibiotics at that time for an acute exacerbation of COPD.  Initially she was treated with levaquin but had pain in the tendons in her left foot and abx were changed (Omnicef).  Respiratory symptoms worsened after antibiotic change and patient reported to the ER for evaluation.  She was admitted to the hospital per [facility] for further evaluation.  Initial chest xray showed findings consistent with COPD, and a new small R pleural effusion. No evidence of acute infiltrate.  WBC 7.3, Hgb 11.3, platelets 431, Na 123, K 4.4, and sr cr 0.56.  The patient was treated with bronchodilators & empiric abx for COPD / possible CAP.  Urine strep antigen assessed and negative.  Patient  reports she can take IV Levaquin but not oral and she can have IV steroid-induced but not oral prednisone due to allergies.  Since admission she reports improvement in symptoms. Sputum has changed from green to clear. Her shortness of breath has improved. She currently denies fevers and chills. She reports concerns regarding her low sodium and Lasix use. She relays a story of a mixup of losartan/hydrochlorothiazide at her pharmacy (was previously taking Lasix and HCTZ). She is followed by Dr. Harrington Challenger for cardiology, Dr. Sarajane Jews for primary care.   The patient continues to smoke 1-5 cigarettes per day. She wears her oxygen at night and occasionally with activity.  She is a retired Radio broadcast assistant (retired at age 56). The patient continues to drive and lives alone.  The patient requested pulmonary consultation.    PAST MEDICAL HISTORY :   has a past medical history of HIP PAIN; HYPERTENSION; Restless leg syndrome; COPD (chronic obstructive pulmonary disease); Peptic ulcer disease; Hyponatremia; Tobacco abuse; Hypomagnesemia; LBBB (left bundle branch block); Pulmonary HTN; Mitral regurgitation; Respiratory failure; Abnormal echocardiogram; Aortic stenosis; Pulmonary HTN; Wide-complex tachycardia; Chronic diastolic CHF (congestive heart failure); Diverticulitis of colon (without mention of hemorrhage) (08/09/2013); Septic shock (12/11/2013); and Acute on chronic combined systolic and diastolic congestive heart failure, NYHA class 4 (11/27/2013).  has past surgical history that includes Abdominal hysterectomy; Dilation and curettage of uterus; Tonsillectomy; Esophagogastroduodenoscopy (07/09/2011); Cataract extraction w/ intraocular lens  implant, bilateral (Bilateral, 03/2013); left and right heart catheterization with coronary  angiogram (N/A, 12/02/2013); and Laparoscopic sigmoid colectomy (07-03-14).    SUBJECTIVE: [place]  VITAL SIGNS: Temp:  [98 F (36.7 C)-98.7 F (37.1 C)] 98.2 F (36.8 C) (08/19 0636) Pulse Rate:   [72-90] 72 (08/19 0636) Resp:  [16-18] 16 (08/19 0636) BP: (143-170)/(49-58) 170/58 mmHg (08/19 0636) SpO2:  [92 %-95 %] 94 % (08/19 0636)  PHYSICAL EXAMINATION: General:  Elderly female in NAD, Speech clear Neuro:  AAOx4,  No focal deficits HEENT: No JVD Cardiovascular:  S1, S2,  rrr, no m/r/g  Lungs:  Decreased air movement Abdomen:   soft, + BS Musculoskeletal:  No acute deformities Skin:  Warm/dry, mild R eye 1+ dependent edema    Recent Labs Lab 02/03/15 0548 02/04/15 0520 02/05/15 1336  NA 133* 132* 133*  K 4.3 4.1 3.9  CL 95* 93* 86*  CO2 32 34* 40*  BUN <5* 5* 6  CREATININE 0.40* 0.45 0.58  GLUCOSE 88 88 127*    Recent Labs Lab 02/01/15 2010 02/03/15 0548  HGB 11.9* 11.3*  HCT 35.2* 34.9*  WBC 7.3 6.9  PLT 431* 396   No results found.  Intake/Output Summary (Last 24 hours) at 02/06/15 0901 Last data filed at 02/06/15 0800  Gross per 24 hour  Intake    600 ml  Output      0 ml  Net    600 ml    ASSESSMENT / PLAN: COPD - with acute exacerbation.  Prior sinus infection before exacerbation.   CHF developed after lasix was held during admission to manage hyponatremia. Still destats on exertion. Requiring 2 Lt O2. She was on RA during daytime at home.   Tobacco Abuse   Plan: -Continue IV levaquin Day 5. She has received PO levaquin and omnicef at home for several days before this admission and has completed the course. No need to continue antibiotics on discharge. -Pulmonary hygiene:  IS, mobilize  -Smoking cessation counseling  -Continue symbicort, spiriva  -Xopenex QID -No steroids as pt is allergic (no significant bronchospasm on exam) -Now on home dose of lasix (30 mg PO qd). Please repeat the CXR. May need a dose of IV lasix to mobilize fluids. -Likely to go home tomorrow.  Marshell Garfinkel MD [facility] Pager [phone] If no answer or after 3pm call: [phone] 02/06/2015, 9:05 AM

## 2015-02-06 NOTE — Discharge Summary (Signed)
Discharge Summary  Jill White UUV:[id number] DOB: 1940-07-01  PCP: Laurey Morale, MD  Admit date: 02/01/2015 Discharge date: 02/06/2015  Time spent: >34mins  Recommendations for Outpatient Follow-up:  1. F/u with PMD within a week for hospital discharge follow up  Discharge Diagnoses:  Active Hospital Problems   Diagnosis Date Noted  . COPD exacerbation 06/15/2012  . Chronic respiratory failure with hypercapnia 07/12/2014  . Combined systolic and diastolic heart failure 47/42/5956  . CAP (community acquired pneumonia) 04/29/2013  . Dehydration with hyponatremia 06/15/2012  . RLS (restless legs syndrome) 07/06/2011  . Essential hypertension 02/09/2007    Resolved Hospital Problems   Diagnosis Date Noted Date Resolved  . Chronic diastolic CHF (congestive heart failure) 05/12/2014 02/02/2015    Discharge Condition: stable  Diet recommendation: heart healthy  Filed Weights   02/02/15 0001  Weight: 132 lb (59.875 kg)    History of present illness:  Jill White is a 74 y.o. female with h/o COPD, on O2 at home on a PRN basis. Seen by Dr. Melvyn Novas on 8/8 for cough, SOB, fever of 101 in office, wheezing. Patients cough was productive of greenish sputum at that time. Patient put on PO levaquin as outpatient. A couple of days later her sputum had turned clear and symptoms improved, but she developed joint aches and so levaquin was switched to omnicef. After going on omnicef she again developed greenish sputum and worsening SOB over the past couple of days prompting her to present to the ED tonight.   Hospital Course:  Principal Problem:   COPD exacerbation Active Problems:   Essential hypertension   RLS (restless legs syndrome)   Dehydration with hyponatremia   CAP (community acquired pneumonia)   Combined systolic and diastolic heart failure   Chronic respiratory failure with hypercapnia  COPD exacerbation - particular about meds, reported not able to tolerate oral  levaquin but ok with iv levaquin, reported allergic to multiple meds including steroids. - Risk of tendon rupture discussed with patient by admitting physician, patient agreed to proceed with treatment. Patient reported not able to tolerate po levaquin due to tendonitis, but ok with iv levaquin - Unable to provide IV steroids or oral prednisone secondary to allergy. - Continue Spiriva, Symbicort, nebulized broncho-dilators, Mucinex and magnesium. - Patient requests pulmonology consult be called.  -improving, appreciated pulmonology input -repeat cxr much improved, discharge home with doxycyline and nebulizer  Active Problems:  Restless Legs Syndrome - Given a one time dose of Mirapex yesterday, with improvement. - Mirapex PRN ordered. -patient requested mirapex prescribed    Essential hypertension - Continue Cardizem and losartan.   Dehydration with hyponatremia - Hydrated. Sodium 123---> 127---> 133. KVO IV fluids. - The patient does have a history of baseline hyponatremia. - Lasix held initially, restarted on 8/17, due to repeat cxr showed mild pleural effusion. - Discussed with patient the concept of taking Lasix on a PRN basis based on weight.   CAP (community acquired pneumonia) - Continue IV Levaquin. Chest x-ray showed a new small right pleural effusion, but no consolidation. - negative Legionella antigen, blood/sputum cultures no growth so far - Strep pneumonia antigen negative. HIV nonreactive. -presented with fever of 101, resolved.   Chronic systolic and diastolic CHF (congestive heart failure) - Watch closely for signs of volume overload while on IV fluids (IVF rate decreased today). - EF 45-50 percent on Echo done 11/23/13. -restarted lasix on 8/17 Additional iv lasix on 8/19   Chronic respiratory failure with hypercapnia - Continue supplemental  oxygen as needed. Treat COPD/CAP as outlined above. -on home oxygen at night -will likely need during day time as  well.   superficial thrombosis of the basilic vein at the level of the elbow and distal upper arm: elevate arm, follow up with pmd.  Family Communication: patient and multiple family members in room Disposition Plan: Lives alone. Reported baseline very independent.  Pulmonology cleared patient to be discharged home and outpatient follow up Code Status: full          Procedures:  none  Consultations:  Pulmonology  ABX: IV levaquin 8/14 to 8/19  Discharge Exam: BP 167/63 mmHg  Pulse 70  Temp(Src) 98.4 F (36.9 C) (Oral)  Resp 18  Ht 5\' 1"  (1.549 m)  Wt 132 lb (59.875 kg)  BMI 24.95 kg/m2  SpO2 92%  Gen: NAD, irritable Cardiovascular: RRR, No M/R/G Respiratory: Lungs diminished wheezes and crackles resolved Gastrointestinal: Abdomen soft, NT/ND, + BS Extremities: Mild edema/erythema, Left arm antecubital area   Discharge Instructions You were cared for by a hospitalist during your hospital stay. If you have any questions about your discharge medications or the care you received while you were in the hospital after you are discharged, you can call the unit and asked to speak with the hospitalist on call if the hospitalist that took care of you is not available. Once you are discharged, your primary care physician will handle any further medical issues. Please note that NO REFILLS for any discharge medications will be authorized once you are discharged, as it is imperative that you return to your primary care physician (or establish a relationship with a primary care physician if you do not have one) for your aftercare needs so that they can reassess your need for medications and monitor your lab values.  Discharge Instructions    DME Nebulizer machine    Complete by:  As directed      Diet - low sodium heart healthy    Complete by:  As directed      Increase activity slowly    Complete by:  As directed             Medication List    STOP taking these  medications        cefdinir 300 MG capsule  Commonly known as:  OMNICEF      TAKE these medications        ALPRAZolam 0.25 MG tablet  Commonly known as:  XANAX  Take 0.25 mg by mouth at bedtime as needed for anxiety.     aspirin 81 MG tablet  Take 81 mg by mouth every morning.     budesonide-formoterol 80-4.5 MCG/ACT inhaler  Commonly known as:  SYMBICORT  Inhale 2 puffs into the lungs 2 (two) times daily.     clonazePAM 0.5 MG tablet  Commonly known as:  KLONOPIN  Take 0.5 tablets (0.25 mg total) by mouth at bedtime as needed (restless leg).     diltiazem 120 MG 24 hr capsule  Commonly known as:  CARDIZEM CD  Take 1 capsule (120 mg total) by mouth daily.     doxycycline 100 MG capsule  Commonly known as:  VIBRAMYCIN  Take 1 capsule (100 mg total) by mouth 2 (two) times daily.     FLUTTER Devi  Use as directed     furosemide 20 MG tablet  Commonly known as:  LASIX  Take 1.5 tablets (30 mg total) by mouth daily.     guaiFENesin 600 MG  12 hr tablet  Commonly known as:  MUCINEX  Take 600 mg by mouth 2 (two) times daily.     ipratropium-albuterol 0.5-2.5 (3) MG/3ML Soln  Commonly known as:  DUONEB  Take 3 mLs by nebulization every 6 (six) hours as needed.     lactobacilus acidophilus & bulgar Tabs chewable tablet  Take 1 tablet by mouth 3 (three) times daily with meals.     losartan 50 MG tablet  Commonly known as:  COZAAR  Take 1 tablet (50 mg total) by mouth daily.     magnesium oxide 400 MG tablet  Commonly known as:  MAG-OX  Take 1 tablet (400 mg total) by mouth daily.     ondansetron 4 MG tablet  Commonly known as:  ZOFRAN  Take 4 mg by mouth every 4 (four) hours as needed for nausea or vomiting.     potassium chloride 10 MEQ tablet  Commonly known as:  K-DUR  Take 1 tablet (10 mEq total) by mouth daily.     pramipexole 0.125 MG tablet  Commonly known as:  MIRAPEX  Take 1 tablet (0.125 mg total) by mouth 3 (three) times daily.     PROAIR HFA 108  (90 BASE) MCG/ACT inhaler  Generic drug:  albuterol  INHALE TWO PUFFS BY MOUTH EVERY FOUR HOURS AS NEEDED     tiotropium 18 MCG inhalation capsule  Commonly known as:  SPIRIVA  Place 1 capsule (18 mcg total) into inhaler and inhale daily.     TraMADol HCl 50 MG Tbdp  Take 1 tablet by mouth every 6 (six) hours as needed.       Allergies  Allergen Reactions  . Tetanus Toxoid Anaphylaxis  . Codeine Nausea And Vomiting and Other (See Comments)    Itching and faint   . Erythromycin Nausea And Vomiting  . Gabapentin     REACTION: severe edema, nausea, dizziness, disorientation  . Levaquin [Levofloxacin In D5w] Other (See Comments)    Joint pain; (states she can take it IV- not by mouth)  . Meloxicam Itching  . Methylprednisolone Hives  . Penicillins Hives  . Prednisone Other (See Comments)    GI bleed       Follow-up Information    Follow up with FRY,STEPHEN A, MD In 1 week.   Specialty:  Family Medicine   Why:  hospital discharge follow up   Contact information:   [place] [id number] [phone]        The results of significant diagnostics from this hospitalization (including imaging, microbiology, ancillary and laboratory) are listed below for reference.    Significant Diagnostic Studies: Dg Chest 2 View  02/06/2015   CLINICAL DATA:  Short of breath  EXAM: CHEST  2 VIEW  COMPARISON:  02/04/2015  FINDINGS: Vascular congestion and interstitial edema have markedly improved. Bilateral pleural effusions are stable on the left and worse on the right. Minimal atelectasis first patchy airspace disease at the lung bases has improved. No pneumothorax. Thoracic compression deformities are stable.  IMPRESSION: Overall, there has been improved CHF. This characterized by improved pulmonary edema, a stable small left pleural effusion, an a minimally worsening right pleural effusion.   Electronically Signed   By: Marybelle Killings M.D.   On: 02/06/2015 10:19   Dg  Chest 2 View  02/01/2015   CLINICAL DATA:  Productive cough.  COPD.  Shortness of breath.  EXAM: CHEST  2 VIEW  COMPARISON:  01/26/2015 and 08/02/2014  FINDINGS: There is  a new small right pleural effusion. There is slight pulmonary vascular prominence. Severe COPD. Old compression fracture in the upper thoracic spine.  Extensive calcification in the thoracic aorta and mitral valve annulus. No consolidative infiltrates.  IMPRESSION: 1. New small right pleural effusion. 2. Severe COPD. 3. Slight increased pulmonary vascularity since the prior study.   Electronically Signed   By: Lorriane Shire M.D.   On: 02/01/2015 19:59   Dg Chest 2 View  01/26/2015   CLINICAL DATA:  Fever and cough.  EXAM: CHEST  2 VIEW  COMPARISON:  07/23/2014.  FINDINGS: Mediastinum hilar structures normal. Cardiomegaly. Diffuse bilateral pulmonary interstitial prominence with bilateral pleural effusions. Congestive heart failure could present this fashion. Pneumonitis cannot be excluded. No pneumothorax. No acute bony abnormality.  IMPRESSION: Cardiomegaly with diffuse bilateral pulmonary interstitial prominence and small pleural effusions suggesting congestive heart failure. Pneumonitis cannot be excluded.   Electronically Signed   By: Marcello Moores  Register   On: 01/26/2015 12:44   Dg Chest Port 1 View  02/04/2015   CLINICAL DATA:  Shortness of breath.  EXAM: PORTABLE CHEST - 1 VIEW  COMPARISON:  02/01/2015.  07/01/2004.  FINDINGS: Mediastinum and hilar structures normal. Cardiomegaly with diffuse bilateral pulmonary interstitial prominence and bilateral effusions consistent with congestive heart failure. No pneumothorax.  IMPRESSION: Congestive heart failure with diffuse bilateral pulmonary interstitial edema and small bilateral pleural effusions .   Electronically Signed   By: Marcello Moores  Register   On: 02/04/2015 07:18    Microbiology: Recent Results (from the past 240 hour(s))  Culture, blood (routine x 2) Call MD if unable to obtain  prior to antibiotics being given     Status: None (Preliminary result)   Collection Time: 02/01/15 11:00 PM  Result Value Ref Range Status   Specimen Description BLOOD RIGHT ANTECUBITAL  Final   Special Requests BOTTLES DRAWN AEROBIC AND ANAEROBIC 5ML  Final   Culture   Final    NO GROWTH 4 DAYS Performed at [facility]    Report Status PENDING  Incomplete  Culture, blood (routine x 2) Call MD if unable to obtain prior to antibiotics being given     Status: None (Preliminary result)   Collection Time: 02/01/15 11:05 PM  Result Value Ref Range Status   Specimen Description BLOOD LEFT HAND  Final   Special Requests BOTTLES DRAWN AEROBIC AND ANAEROBIC 5ML  Final   Culture   Final    NO GROWTH 4 DAYS Performed at [facility]    Report Status PENDING  Incomplete  Culture, sputum-assessment     Status: None   Collection Time: 02/02/15  6:10 PM  Result Value Ref Range Status   Specimen Description SPUTUM  Final   Special Requests NONE  Final   Sputum evaluation   Final    MICROSCOPIC FINDINGS SUGGEST THAT THIS SPECIMEN IS NOT REPRESENTATIVE OF LOWER RESPIRATORY SECRETIONS. PLEASE RECOLLECT. RESULTS CALLED TO, READ BACK BY  AND VERIFIED  WITH Ennis Forts [id number] @ [place]    Report Status 02/02/2015 FINAL  Final  Culture, expectorated sputum-assessment     Status: None   Collection Time: 02/03/15  8:28 AM  Result Value Ref Range Status   Specimen Description SPUTUM  Final   Special Requests NONE  Final   Sputum evaluation   Final    THIS SPECIMEN IS ACCEPTABLE. RESPIRATORY CULTURE REPORT TO FOLLOW.   Report Status 02/03/2015 FINAL  Final  Culture, respiratory (NON-Expectorated)     Status: None  Collection Time: 02/03/15  8:35 AM  Result Value Ref Range Status   Specimen Description SPUTUM  Final   Special Requests NONE  Final   Gram Stain   Final    FEW WBC PRESENT,BOTH PMN AND MONONUCLEAR FEW SQUAMOUS EPITHELIAL CELLS PRESENT FEW GRAM POSITIVE  COCCI IN PAIRS Performed at [facility]    Culture   Final    NORMAL OROPHARYNGEAL FLORA Performed at [facility]    Report Status 02/05/2015 FINAL  Final     Labs: Basic Metabolic Panel:  Recent Labs Lab 02/01/15 2010 02/02/15 0515 02/03/15 0548 02/04/15 0520 02/05/15 1336  NA 123* 127* 133* 132* 133*  K 4.4 3.8 4.3 4.1 3.9  CL 79* 84* 95* 93* 86*  CO2 35* 34* 32 34* 40*  GLUCOSE 109* 92 88 88 127*  BUN 5* <5* <5* 5* 6  CREATININE 0.56 0.49 0.40* 0.45 0.58  CALCIUM 9.2 8.7* 8.4* 8.2* 8.4*  MG  --   --   --   --  1.7   Liver Function Tests: No results for input(s): AST, ALT, ALKPHOS, BILITOT, PROT, ALBUMIN in the last 168 hours. No results for input(s): LIPASE, AMYLASE in the last 168 hours. No results for input(s): AMMONIA in the last 168 hours. CBC:  Recent Labs Lab 02/01/15 2010 02/03/15 0548  WBC 7.3 6.9  NEUTROABS 4.4  --   HGB 11.9* 11.3*  HCT 35.2* 34.9*  MCV 92.1 94.6  PLT 431* 396   Cardiac Enzymes:  Recent Labs Lab 02/01/15 2010  TROPONINI <0.03   BNP: BNP (last 3 results) No results for input(s): BNP in the last 8760 hours.  ProBNP (last 3 results)  Recent Labs  03/26/14 1005 05/12/14 1316 05/14/14 0533  PROBNP 94.0 577.4* 1351.0*    CBG: No results for input(s): GLUCAP in the last 168 hours.     SignedFlorencia Reasons MD, PhD  [facility] 02/06/2015, 2:13 PM

## 2015-02-07 LAB — CULTURE, BLOOD (ROUTINE X 2)
Culture: NO GROWTH
Culture: NO GROWTH

## 2015-02-09 ENCOUNTER — Telehealth: Payer: Self-pay | Admitting: Internal Medicine

## 2015-02-09 DIAGNOSIS — I509 Heart failure, unspecified: Secondary | ICD-10-CM

## 2015-02-09 DIAGNOSIS — I350 Nonrheumatic aortic (valve) stenosis: Secondary | ICD-10-CM

## 2015-02-09 NOTE — Telephone Encounter (Signed)
Please set up for follow up echo  Dx: CHF and AS    ----- Message -----     From: Tanda Rockers, MD     Sent: 02/01/2015 10:59 AM      To: Fay Records, MD        cxr suggested mild edema ? Needs f/u echo at some point but defer to you        thx    I spoke with the patient and she is aware of Dr. Harrington Challenger' recommendations for a repeat echo. She reports she was just in the hospital for pneumonia and is very weak. I advised her that we can schedule the echo at her convenience, but I will have scheduling to call her. She is agreeable with being contacted to schedule.

## 2015-02-13 ENCOUNTER — Ambulatory Visit: Payer: Medicare Other | Admitting: Family Medicine

## 2015-02-13 ENCOUNTER — Ambulatory Visit (HOSPITAL_COMMUNITY): Payer: Medicare Other | Attending: Cardiovascular Disease

## 2015-02-13 ENCOUNTER — Other Ambulatory Visit: Payer: Self-pay

## 2015-02-13 DIAGNOSIS — I10 Essential (primary) hypertension: Secondary | ICD-10-CM | POA: Diagnosis not present

## 2015-02-13 DIAGNOSIS — I059 Rheumatic mitral valve disease, unspecified: Secondary | ICD-10-CM | POA: Insufficient documentation

## 2015-02-13 DIAGNOSIS — Z8249 Family history of ischemic heart disease and other diseases of the circulatory system: Secondary | ICD-10-CM | POA: Diagnosis not present

## 2015-02-13 DIAGNOSIS — I358 Other nonrheumatic aortic valve disorders: Secondary | ICD-10-CM | POA: Diagnosis not present

## 2015-02-13 DIAGNOSIS — I509 Heart failure, unspecified: Secondary | ICD-10-CM | POA: Diagnosis not present

## 2015-02-13 DIAGNOSIS — I350 Nonrheumatic aortic (valve) stenosis: Secondary | ICD-10-CM | POA: Insufficient documentation

## 2015-02-13 DIAGNOSIS — F17200 Nicotine dependence, unspecified, uncomplicated: Secondary | ICD-10-CM | POA: Insufficient documentation

## 2015-02-13 NOTE — Progress Notes (Signed)
Patient ID: Jill White, female   DOB: 09-Jul-1940, 74 y.o.   MRN: [id number]   2D Echocardiogram Complete.  02/13/2015   Deliah Boston, RDCS   Preliminary Technician Findings:  This was a very technically difficult study.  The Mitral and Aortic valves are both severely calcified and difficult to evaluate.  The Pedoff probe velocities were unable to be obtained.

## 2015-02-16 ENCOUNTER — Ambulatory Visit (INDEPENDENT_AMBULATORY_CARE_PROVIDER_SITE_OTHER): Payer: Medicare Other | Admitting: Family Medicine

## 2015-02-16 ENCOUNTER — Telehealth: Payer: Self-pay | Admitting: Internal Medicine

## 2015-02-16 ENCOUNTER — Encounter: Payer: Self-pay | Admitting: Family Medicine

## 2015-02-16 VITALS — BP 127/70 | HR 57 | Temp 97.9°F | Ht 61.0 in | Wt 118.0 lb

## 2015-02-16 DIAGNOSIS — J449 Chronic obstructive pulmonary disease, unspecified: Secondary | ICD-10-CM

## 2015-02-16 DIAGNOSIS — J189 Pneumonia, unspecified organism: Secondary | ICD-10-CM

## 2015-02-16 NOTE — Progress Notes (Signed)
Pre visit review using our clinic review tool, if applicable. No additional management support is needed unless otherwise documented below in the visit note. 

## 2015-02-16 NOTE — Telephone Encounter (Signed)
Bring her in on 02/19/15 morning. Jsut add on but tell her so

## 2015-02-16 NOTE — Telephone Encounter (Signed)
Called spoke with Janett Billow [facility]). She reports pt was d/c'd from hospital 02/06/15. Pt is feeling increase SOB. Pt saw PCP today and did not have her O2 on and sats were in 70's. Once pt put O2 on she recovered into the 80's. When asked if pt wears O2 consistently, was advised when pt sleeps she does and as she feels she needs it during the day. Pt will finish ABX today. Also doing her neb TX's as prescribed. Pt wants an appt to see MR only. Please advise thanks

## 2015-02-16 NOTE — Telephone Encounter (Signed)
Spoke with Jill White.  Pt scheduled to see MR on 02/19/15.  Pt to arrive at 9:15 am.  Jill White verbalized understanding and voiced no further questions or concerns at this time.

## 2015-02-16 NOTE — Progress Notes (Signed)
   Subjective:    Patient ID: Jill White, female    DOB: 22-Mar-1941, 74 y.o.   MRN: [id number]  HPI Here to follow up a hospital stay from 02-01-15 to 02-06-15 for CAP and a COPD exacerbation. She was admitted with a cough productive of green sputum and pronounced SOB. She was treated with IV Levaquin and sent home on Doxycycline which she finished yesterday. She feels much better with no coughing, although she is still SOB. She is wearing [place] oxygen at 2 liters of flow. Drinking fluids.    Review of Systems  Constitutional: Positive for fatigue. Negative for fever, chills and diaphoresis.  HENT: Negative.   Respiratory: Positive for shortness of breath and wheezing. Negative for cough.   Cardiovascular: Negative.   Neurological: Negative.        Objective:   Physical Exam  Constitutional: She is oriented to person, place, and time.  Alert, in a wheelchair   Neck: No thyromegaly present.  Cardiovascular: Normal rate, regular rhythm and intact distal pulses.   2/6 SM as usual  Pulmonary/Chest: Effort normal. No respiratory distress. She has no wheezes. She has no rales.  Decreased BS throughout   Lymphadenopathy:    She has no cervical adenopathy.  Neurological: She is alert and oriented to person, place, and time.          Assessment & Plan:  She has severe COPD and has recovered from a recent pneumonia. Wear the oxygen at all times. Recheck prn

## 2015-02-17 NOTE — Patient Outreach (Signed)
[name] [facility]) Care Management  02/17/2015  Jill White 08/28/1940 [id number]   Referral from Elsie List, assigned Dannielle Huh, RN to outreach.  Thanks, Ronnell Freshwater. [place], [place] Assistant Phone: [phone] Fax: [phone]

## 2015-02-18 ENCOUNTER — Other Ambulatory Visit: Payer: Self-pay | Admitting: *Deleted

## 2015-02-18 NOTE — Patient Outreach (Signed)
[place] [facility]) Care Management  02/18/2015  Jill White [birth year] [id number]   Assessment: Initial patient outreach call Call placed and spoke to daughter Jill White) and patient. Daughter states patient is "not available" to speak with. After introducing myself and where I am calling from [facility] care management), patient talk briefly. Identity verified. Reintroduced myself and explained the purpose of the call. Patient states, "I don't have any health concerns at the moment". Patient asked where this referral is coming from and was told that it is from her primary care provider. She further states, "I'm gonna call my doctor if he really referred me to you". Patient asked for care management coordinator's name and contact number and was provided. Patient told this care management coordinator, "I'll get back with you after I call my doctor".   Plan: Will await patient's call back to try and engage patient to [facility] services.  Lorielle Boehning A. Leigh Blas, BSN, RN-BC [place] Management Coordinator Cell: [phone]

## 2015-02-19 ENCOUNTER — Ambulatory Visit (INDEPENDENT_AMBULATORY_CARE_PROVIDER_SITE_OTHER)
Admission: RE | Admit: 2015-02-19 | Discharge: 2015-02-19 | Disposition: A | Discharge status: Home / Self Care | Payer: Medicare Other | Source: Ambulatory Visit | Attending: Internal Medicine | Admitting: Internal Medicine

## 2015-02-19 ENCOUNTER — Ambulatory Visit (INDEPENDENT_AMBULATORY_CARE_PROVIDER_SITE_OTHER): Payer: Medicare Other | Admitting: Internal Medicine

## 2015-02-19 ENCOUNTER — Encounter: Payer: Self-pay | Admitting: Internal Medicine

## 2015-02-19 ENCOUNTER — Telehealth: Payer: Self-pay | Admitting: Internal Medicine

## 2015-02-19 VITALS — BP 138/80 | HR 55 | Ht 61.0 in | Wt 119.4 lb

## 2015-02-19 DIAGNOSIS — J449 Chronic obstructive pulmonary disease, unspecified: Secondary | ICD-10-CM

## 2015-02-19 DIAGNOSIS — F17200 Nicotine dependence, unspecified, uncomplicated: Secondary | ICD-10-CM

## 2015-02-19 DIAGNOSIS — Z8659 Personal history of other mental and behavioral disorders: Secondary | ICD-10-CM | POA: Diagnosis not present

## 2015-02-19 DIAGNOSIS — Z720 Tobacco use: Secondary | ICD-10-CM | POA: Diagnosis not present

## 2015-02-19 DIAGNOSIS — J9612 Chronic respiratory failure with hypercapnia: Secondary | ICD-10-CM

## 2015-02-19 NOTE — Progress Notes (Signed)
Subjective:    Patient ID: Jill White, female    DOB: 1940-08-03, 74 y.o.   MRN: [id number]  HPI    OV 02/19/2015  Chief Complaint  Patient presents with  . Acute Visit    Pt recent d/c from [facility] for COPD exacerbation. Pt c/o increase in SOB, fatigue, prod cough with light yellow mucus, low O2 sats.     74 year old female Gold stage II COPD, history of frequent exacerbations, continued smoking and previous refusal to attend pulmonary rehabilitation.  Admitted 02/01/2015 through 02/06/2015 for COPD exacerbation and discharged on doxycycline per her history. Review of the chart shows several different diagnosis from heart failure due to pneumonia to COPD exacerbation. Review of the chest x-rays during this admission which I personally visualized shows pulmonary edema. A follow-up echo shows that actually her ejection fraction is improved to 55-65 percent with some diastolic dysfunction and associated moderate stenosis. Dr. Dorris Carnes her cardiologist is aware of this and this placed her on surveillance  Since discharge patient as reported by her and by her granddaughter who is with her is much better. However few days ago had a panic attack and therefore made a quick follow-up appointment to see me today. The extremity concerned about panic attack. Currently taking Xanax when necessary. They want to know about long-term solutions panic attack which makes COPD worse. Patient is also petrified about going to the hospital. Apparently [facility] has called a full support but it appears that the nursing interaction over the phone was not done well and therefore she does not want to use this support system. They also worried about pneumonia risk wiTH inhaled steroid but after extensive discussion they prefer to be on 3 inhalers and except the benefit of dyspnea relief. Today she is more open to attending pulmonary rehabilitation     CAT COPD Symptom & Quality of Life Score ([place]) 0 is no burden. 5 is highest burden 02/19/2015   Never Cough -> Cough all the time 2  No phlegm in chest -> Chest is full of phlegm 2  No chest tightness -> Chest feels very tight 0  No dyspnea for 1 flight stairs/hill -> Very dyspneic for 1 flight of stairs 5  No limitations for ADL at home -> Very limited with ADL at home 4  Confident leaving home -> Not at all confident leaving home 0  Sleep soundly -> Do not sleep soundly because of lung condition 2  Lots of Energy -> No energy at all 3  TOTAL Score (max 40)  18      reports that she has been smoking Cigarettes.  She has smoked for the past 50 years. She has never used smokeless tobacco.     Current outpatient prescriptions:  .  albuterol (PROVENTIL) (2.5 MG/3ML) 0.083% nebulizer solution, 2.5mg  q4hrs prn for sob, icd J44.9, Disp: 75 mL, Rfl: 12 .  aspirin 81 MG tablet, Take 81 mg by mouth every morning. , Disp: , Rfl:  .  budesonide-formoterol (SYMBICORT) 80-4.5 MCG/ACT inhaler, Inhale 2 puffs into the lungs 2 (two) times daily., Disp: 1 Inhaler, Rfl: 6 .  clonazePAM (KLONOPIN) 0.5 MG tablet, Take 0.5 tablets (0.25 mg total) by mouth at bedtime as needed (restless leg)., Disp: 15 tablet, Rfl: 0 .  diltiazem (CARDIZEM CD) 120 MG 24 hr capsule, Take 1 capsule (120 mg total) by mouth daily., Disp: 30 capsule, Rfl: 12 .  furosemide (LASIX) 20 MG tablet, Take 1.5 tablets (30  mg total) by mouth daily., Disp: 45 tablet, Rfl: 5 .  guaiFENesin (MUCINEX) 600 MG 12 hr tablet, Take 600 mg by mouth 2 (two) times daily., Disp: , Rfl:  .  ipratropium (ATROVENT) 0.02 % nebulizer solution, 0.5mg  q4hrs prn, icd J44.9, Disp: 75 mL, Rfl: 12 .  ipratropium-albuterol (DUONEB) 0.5-2.5 (3) MG/3ML SOLN, Take 3 mLs by nebulization every 6 (six) hours as needed., Disp: 360 mL, Rfl: 0 .  lactobacilus acidophilus & bulgar (FLORANEX) TABS chewable tablet, Take 1 tablet by mouth 3 (three) times daily with meals., Disp: 30 tablet, Rfl: 0 .  losartan  (COZAAR) 50 MG tablet, Take 1 tablet (50 mg total) by mouth daily., Disp: 90 tablet, Rfl: 3 .  magnesium oxide (MAG-OX) 400 MG tablet, Take 1 tablet (400 mg total) by mouth daily., Disp: 30 tablet, Rfl: 6 .  ondansetron (ZOFRAN) 4 MG tablet, Take 4 mg by mouth every 4 (four) hours as needed for nausea or vomiting., Disp: , Rfl:  .  potassium chloride (K-DUR) 10 MEQ tablet, Take 1 tablet (10 mEq total) by mouth daily., Disp: 90 tablet, Rfl: 3 .  PROAIR HFA 108 (90 BASE) MCG/ACT inhaler, INHALE TWO PUFFS BY MOUTH EVERY FOUR HOURS AS NEEDED (Patient taking differently: INHALE TWO PUFFS BY MOUTH EVERY FOUR HOURS AS NEEDED shortness of breath/wheezing), Disp: 8.5 Inhaler, Rfl: 3 .  Respiratory Therapy Supplies (FLUTTER) DEVI, Use as directed, Disp: 1 each, Rfl: 0 .  tiotropium (SPIRIVA) 18 MCG inhalation capsule, Place 1 capsule (18 mcg total) into inhaler and inhale daily., Disp: 30 capsule, Rfl: 12 .  ALPRAZolam (XANAX) 0.25 MG tablet, Take 0.25 mg by mouth at bedtime as needed for anxiety., Disp: , Rfl:    Review of Systems  Constitutional: Positive for fatigue. Negative for fever and unexpected weight change.  HENT: Negative for congestion, dental problem, ear pain, nosebleeds, postnasal drip, rhinorrhea, sinus pressure, sneezing, sore throat and trouble swallowing.   Eyes: Negative for redness and itching.  Respiratory: Positive for cough, chest tightness and shortness of breath. Negative for wheezing.   Cardiovascular: Negative for palpitations and leg swelling.  Gastrointestinal: Negative for nausea and vomiting.  Genitourinary: Negative for dysuria.  Musculoskeletal: Negative for joint swelling.  Skin: Negative for rash.  Neurological: Negative for headaches.  Hematological: Does not bruise/bleed easily.  Psychiatric/Behavioral: Negative for dysphoric mood. The patient is not nervous/anxious.        Objective:   Physical Exam  Filed Vitals:   02/19/15 0923  BP: 138/80  Pulse: 55    Height: 5\' 1"  (1.549 m)  Weight: 119 lb 6.4 oz (54.159 kg)  SpO2: 91%         Assessment & Plan:     ICD-9-CM ICD-10-CM   1. Chronic respiratory failure with hypercapnia 518.83 J96.12 DG Chest 2 View  2. Chronic obstructive pulmonary disease, unspecified COPD, unspecified chronic bronchitis type 496 J44.9 Pulmonary rehab therapeutic exercise     DG Chest 2 View  3. Smoking 305.1 Z72.0   4. History of panic attacks V11.8 Z86.59       - Glad you are better from your recent exacerbation - Please do a chest x-ray 2 view today to make sure the x-ray has cleared up - Addressing panic attacks with primary care physician Dr. Sarajane Jews will help with shortness of breath   - I have written to him about this - Refer pulmonary rehabilitation for the above - Quit smoking -We discussed ROflumilast  therapy for preventing COPD exacerbation but  decided to hold off based on her decision - We discussed scaling down to 2 inhalers but for now we will continue triple inhaler therapy   -We understood the slight increase in pneumonia risk because of inhaled steroid-   Follow-up  - Flu shot in the fall - 3 months with cat score to see me   > 50% of this > 25 min visit spent in face to face counseling or coordination of care    Dr. Brand Males, M.D., [facility].C.P Pulmonary and Critical Care Medicine Staff Physician [place] [facility] Pager: [phone], If no answer or between  15:00h - 7:00h: call [phone]  02/19/2015 10:14 AM

## 2015-02-19 NOTE — Patient Instructions (Addendum)
ICD-9-CM ICD-10-CM   1. Chronic respiratory failure with hypercapnia 518.83 J96.12   2. Chronic obstructive pulmonary disease, unspecified COPD, unspecified chronic bronchitis type 496 J44.9   3. Smoking 305.1 Z72.0   4. History of panic attacks V11.8 Z86.59     - Glad you are better from your recent exacerbation - Please do a chest x-ray 2 view today to make sure the x-ray has cleared up - Addressing panic attacks with primary care physician Dr. Sarajane Jews will help with shortness of breath   - I have written to him about this - Refer pulmonary rehabilitation for the above - Quit smoking -We discussed ROflumilast  therapy for preventing COPD exacerbation but decided to hold off - We discussed scaling down to 2 inhalers but for now we will continue triple inhaler therapy   -We understood the slight increase in pneumonia risk because of inhaled steroid-   Follow-up  - Flu shot in the fall - 3 months with cat score to see me

## 2015-02-19 NOTE — Telephone Encounter (Signed)
Dr Arville Lime if an SSRI or SNRI is beneficial for her panic attack? I got hx this makes her dyspnea worse and sets of vicious cycle of COPD  Thanks  Dr. Brand Males, M.D., [facility].C.P Pulmonary and Critical Care Medicine Staff Physician [place] [facility] Critical [facility] Pager: [phone], If no answer or between  15:00h - 7:00h: call [phone]  02/19/2015 10:19 AM

## 2015-02-19 NOTE — Telephone Encounter (Signed)
I agree that her anxiety can precipitate a COPD flare. Let's try her on Zoloft 50 mg daily. Call in #30 with 2 rf

## 2015-02-20 ENCOUNTER — Ambulatory Visit: Payer: Medicare Other | Admitting: Family Medicine

## 2015-02-20 ENCOUNTER — Telehealth: Payer: Self-pay

## 2015-02-20 MED ORDER — SERTRALINE HCL 50 MG PO TABS: 50.0000 mg | ORAL_TABLET | Freq: Every day | ORAL | Status: DC

## 2015-02-20 NOTE — Telephone Encounter (Signed)
The pt's daughter called and is hoping to get clarification regarding the treatment for the pt's panic attacks.  I offered her an apt for today, but she refused stating they were already seen for this issue.  Callback - [phone]

## 2015-02-20 NOTE — Telephone Encounter (Signed)
I sent script e-scribe and spoke with pt. 

## 2015-02-20 NOTE — Telephone Encounter (Signed)
I spoke with pt, please see previous note.  

## 2015-02-27 ENCOUNTER — Other Ambulatory Visit: Payer: Self-pay | Admitting: *Deleted

## 2015-02-27 ENCOUNTER — Telehealth (HOSPITAL_COMMUNITY): Payer: Self-pay

## 2015-02-27 NOTE — Patient Outreach (Signed)
[facility]) Care Management  02/27/2015  Jill White October 12, 1940 [id number]  Assessment: Telephone screen Patient is a High Risk List referral. Unable to receive call from patient to update care management coordinator if she was able to verify with primary care provider's office about this referral. Call placed to speak with patient but no answer. HIPAA compliant voice message left with name and contact number.  Plan: Will await for return call. If unable to receive a call back, will attempt to reach patient on next scheduled outreach call. Will contact primary care provider's office to get update on patient.

## 2015-02-27 NOTE — Telephone Encounter (Signed)
Returned patient's call. Patient unavailable. Spoke with patients daughter Maudie Mercury. Informed her that we have not yet received an order for patient to attend pulmonary rehab as this sometimes may take up to a week after patients appointment with pulmonologist. Daughter verbalized understanding. Will follow up with patient once order is received.

## 2015-03-02 ENCOUNTER — Telehealth: Payer: Self-pay | Admitting: Family Medicine

## 2015-03-02 ENCOUNTER — Other Ambulatory Visit: Payer: Self-pay | Admitting: *Deleted

## 2015-03-02 NOTE — Patient Outreach (Signed)
[place] [facility]) Care Management  03/02/2015  FRANCHELLE FOSKETT January 21, 1941 [id number]   Assessment: Telephone screen- 3rd attempt Patient is a High Risk List referral. Call placed to patient but no answer. Unable to receive any call back from messages left. HIPAA compliant message left with name and contact number.   Plan: Will await for return call. Will call and get update from primary care provider's office. If unable to receive a call back, will send a Patient Outreach Letter.  Aeon Kessner A. Ajee Heasley, BSN, RN-BC [place] Management Coordinator Cell: [phone]

## 2015-03-02 NOTE — Telephone Encounter (Signed)
Jill White form [facility] call to say that the pt will not answer her call nor response to messages left.  to . Pt told her that she going to contact this office and verify that she was to be contacted for high risk list   Jill White phone number is [phone]

## 2015-03-02 NOTE — Patient Outreach (Signed)
[name] [facility]) Care Management  03/02/2015  Jill White 27-Jul-1940 [id number]   Assessment: Call to primary care provider's office  Call placed to primary care provider's office to get update if patient had called and verified about this referral. Spoke with  staff Jill White) and reports nothing shows that patient called to verify. She verbalized "will give patient a call to let her know we did refer her to you". Made staff aware that attempts to contact patient were made but unsuccessful and no return calls received from patient on messages left.  Plan: Will await patient's call back. Will send patient an Outreach Letter if still unable to hear from her.  Jill White A. Cutler Sunday, BSN, RN-BC [place] Management Coordinator Cell: [phone]

## 2015-03-03 NOTE — Telephone Encounter (Signed)
noted 

## 2015-03-04 ENCOUNTER — Encounter: Payer: Self-pay | Admitting: *Deleted

## 2015-03-04 ENCOUNTER — Other Ambulatory Visit: Payer: Self-pay | Admitting: *Deleted

## 2015-03-04 NOTE — Patient Outreach (Signed)
[place] [facility]) Care Management  03/04/2015  Jill White May 08, 1941 [id number]   Assessment: Unsuccessful attempts for care coordination Call placed to patient to follow-up verification of referral from primary care provider and still unable to reach her.  HIPAA compliant voice message left with name and contact number.  Plan: Will send Patient Outreach Letter and wait for response. If unable to hear from patient, then will close case.  Jill White A. Meklit Cotta, BSN, RN-BC [place] Management Coordinator Cell: [phone]

## 2015-03-04 NOTE — Patient Outreach (Signed)
[place] [facility]) Care Management  03/04/2015  Jill White [birth year] [id number]   Assessment: Telephone screen (High Risk List referral) Received an inbound call from [name] (emergency contact) with message to call her back regarding patient. Call placed to [name] (granddaughter/ caregiver) and spoke with her. Verified identity. Care management coordinator introduced self and explained purpose of the call.   Ms. Jill White states that patient is "very independent" and "in stable condition" right now. She said that patient "does not feel like she needs anybody right now". [name] reports that she visits and checks her grandmother everyday to "make sure she is doing ok and her breathing and oxygen are ok". 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  Per patient's granddaughter, patient was referred to the COPD Rehab at [facility] by pulmonologist (Dr. Chase Caller) and is waiting for appointment schedule to be called in. She also reports that patient is "very excited" to attend the COPD orientation class.  Ms. Jill White states, "we have your phone number, main number and 1-800 number", "we will call you or ask the doctor, if there are any problems that will arise".  [facility] care management services declined at this time. Encourage to call if there are any further or future needs of patient.   Plan: Will close case. Will notify primary care provider of case closure.  [name] A. Kelcey Wickstrom, BSN, RN-BC [place] Management Coordinator Cell: [phone]

## 2015-03-06 NOTE — Patient Outreach (Signed)
[place] [facility]) Care Management  03/06/2015  BLESSEN KIMBROUGH Apr 29, 1941 [id number]   Notification from Dannielle Huh, RN to close case due to patient refused [place] Management services.  Thanks, Ronnell Freshwater. [place], [place] Assistant Phone: [phone] Fax: [phone]

## 2015-03-13 ENCOUNTER — Ambulatory Visit (INDEPENDENT_AMBULATORY_CARE_PROVIDER_SITE_OTHER): Payer: Medicare Other | Admitting: Family Medicine

## 2015-03-13 ENCOUNTER — Ambulatory Visit: Payer: Medicare Other | Admitting: Internal Medicine

## 2015-03-13 DIAGNOSIS — Z23 Encounter for immunization: Secondary | ICD-10-CM | POA: Diagnosis not present

## 2015-03-16 ENCOUNTER — Telehealth: Payer: Self-pay | Admitting: Internal Medicine

## 2015-03-16 ENCOUNTER — Telehealth (HOSPITAL_COMMUNITY): Payer: Self-pay

## 2015-03-16 NOTE — Telephone Encounter (Signed)
Called patient to discuss Pulmonary Rehab referral from Dr. Chase Caller.  Patient is interested in program but wants [place] to check with Dr. Harrington Challenger before she starts exercising to get the okay from her. Patient states this would make her feel more comfortable. Called Dr. Alan Ripper office today (03/16/15) and left message with nurse.

## 2015-03-16 NOTE — Telephone Encounter (Signed)
New message      Talk to the nurse about getting an approval for pt to participate in pul rehab.  They have an order from another doctor, but pt want to make sure Dr Harrington Challenger says it is ok

## 2015-03-18 NOTE — Telephone Encounter (Signed)
I have sent note to nurse  Pt OK to proceed with pumonary rehab.

## 2015-03-30 ENCOUNTER — Ambulatory Visit (HOSPITAL_COMMUNITY): Payer: Medicare Other

## 2015-04-05 ENCOUNTER — Telehealth: Payer: Self-pay | Admitting: Internal Medicine

## 2015-04-05 ENCOUNTER — Other Ambulatory Visit: Payer: Self-pay | Admitting: Internal Medicine

## 2015-04-05 MED ORDER — DOXYCYCLINE HYCLATE 100 MG PO TABS: 100.0000 mg | ORAL_TABLET | Freq: Two times a day (BID) | ORAL | Status: DC

## 2015-04-05 NOTE — Telephone Encounter (Signed)
increaseing cough congestion with mucus turning yellower still smoking   rec no smoking/ doxy x 7 days f/u this week if not improving

## 2015-04-06 NOTE — Telephone Encounter (Signed)
lmtcb for pt.  

## 2015-04-06 NOTE — Telephone Encounter (Signed)
Elise  Pls fu with patient.Dr Melvyn Novas took call over weekend

## 2015-04-07 NOTE — Telephone Encounter (Signed)
Called and spoke to pt. Pt states her mucus production is still a slight yellow color but she is feeling much better. Pt states her SOB is back to baseline. Pt states she no longer having a fever. Overall pt is much improved.   Will forward to MR as FYI.

## 2015-04-13 ENCOUNTER — Encounter (HOSPITAL_COMMUNITY)
Admission: RE | Admit: 2015-04-13 | Discharge: 2015-04-13 | Disposition: A | Discharge status: Home / Self Care | Payer: Medicare Other | Source: Ambulatory Visit | Attending: Internal Medicine | Admitting: Internal Medicine

## 2015-04-13 ENCOUNTER — Encounter (HOSPITAL_COMMUNITY): Payer: Self-pay

## 2015-04-13 DIAGNOSIS — J438 Other emphysema: Secondary | ICD-10-CM | POA: Diagnosis present

## 2015-04-13 NOTE — Progress Notes (Signed)
Jill White 74 y.o. female Pulmonary Rehab Orientation Note Patient arrived today in Cardiac and Pulmonary Rehab for orientation to Pulmonary Rehab. She was transported from [employer] via wheel chair, accompanied by her granddaughter. She does carry portable oxygen to use PRN, and wears 2 liters q HS. She checks her oxygen saturation frequently and understands that if her sats drop below 88% she is to administer O2. She states she is excited to be in pulmonary rehab. She was hospitalized in August for CHF and is looking to regain her stamina and strength. She denies any psychosocial issues that would prevent her participation in pulmonary rehab. 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 She lives alone and is very close to her granddaughter. Color good, skin warm and dry. Patient is oriented to time and place. Patient's medical history and medications reviewed. Heart rate is tachycardic per patients norm, S1S2 present. Murmur also noted. Breath sounds clear to auscultation, no wheezes, rales, or rhonchi. Diminished in the bases. Grip strength equal, strong. Distal pulses palpable. No edema noted. Patient reports she does take medications as prescribed. Patient states she follows a Regular diet. The patient reports no specific efforts to gain or lose weight. She has had some weight loss since her hospitalization in August. Patient's weight will be monitored closely. Demonstration and practice of PLB using pulse oximeter. Patient able to return demonstration satisfactorily. Safety and hand hygiene in the exercise area reviewed with patient. Patient voices understanding of the information reviewed. Department expectations discussed with patient and achievable goals were set. The patient shows enthusiasm about attending the program and we look forward to working with this nice gentleman. The patient is scheduled for a 6 min walk test on Thursday 10/27 at 3:30  and to begin exercise on Thursday 11/1 in the 10:30 class.   45 minutes was spent on a variety of activities such as assessment of the patient, obtaining baseline data including height, weight, BMI, and grip strength, verifying medical history, allergies, and current medications, and teaching patient strategies for performing tasks with less respiratory effort with emphasis on pursed lip breathing.

## 2015-04-16 ENCOUNTER — Encounter (HOSPITAL_COMMUNITY)
Admission: RE | Admit: 2015-04-16 | Discharge: 2015-04-16 | Disposition: A | Discharge status: Home / Self Care | Payer: Medicare Other | Source: Ambulatory Visit | Attending: Internal Medicine | Admitting: Internal Medicine

## 2015-04-16 DIAGNOSIS — J438 Other emphysema: Secondary | ICD-10-CM | POA: Diagnosis not present

## 2015-04-16 NOTE — Progress Notes (Signed)
Jill White completed a Six-Minute Walk Test on 04/16/15 . Jill White walked 746 feet with 0 breaks.  The patient's lowest oxygen saturation was 83 %, highest heart rate was 106 bpm , and highest blood pressure was 140/62. The patient was on 2 liters of oxygen with a nasal cannula. Patient stated that nothing hindered their walk test. Patient did state that in the beginning of her walk test she was holding her breath.

## 2015-04-20 ENCOUNTER — Other Ambulatory Visit: Payer: Self-pay | Admitting: Internal Medicine

## 2015-04-21 ENCOUNTER — Encounter (HOSPITAL_COMMUNITY)
Admission: RE | Admit: 2015-04-21 | Discharge: 2015-04-21 | Disposition: A | Discharge status: Home / Self Care | Payer: Medicare Other | Source: Ambulatory Visit | Attending: Internal Medicine | Admitting: Internal Medicine

## 2015-04-21 DIAGNOSIS — J438 Other emphysema: Secondary | ICD-10-CM | POA: Insufficient documentation

## 2015-04-21 NOTE — Progress Notes (Signed)
Today, Jill White exercised at [employer]. [facility]. Service time was from 10:30am to 12:15pm.  The patient exercised by performing aerobic, strengthening, and stretching exercises. Oxygen saturation, heart rate, blood pressure, rate of perceived exertion, and shortness of breath were all monitored before, during, and after exercise. Jill White presented with no problems at today's exercise session.  The patient did not have an increase in workload intensity during today's exercise session.  Pre-exercise vitals: . Weight kg: 54.1 . Liters of O2: 2 . SpO2: 89 . HR: 99 . BP: 122/50 . CBG: na  Exercise vitals: . Highest heartrate:  109 . Lowest oxygen saturation: 89 . Highest blood pressure: 140/72 . Liters of 02: 2  Post-exercise vitals: . SpO2: 93 . HR: 99 . BP: 118/58 . Liters of O2: 2 . CBG: na  Dr. Brand Males, Medical Director Dr. Marily Memos is immediately available during today's Pulmonary Rehab session for Jill White on 04/21/15 at 10:30am class time

## 2015-04-23 ENCOUNTER — Encounter (HOSPITAL_COMMUNITY)
Admission: RE | Admit: 2015-04-23 | Discharge: 2015-04-23 | Disposition: A | Discharge status: Home / Self Care | Payer: Medicare Other | Source: Ambulatory Visit | Attending: Internal Medicine | Admitting: Internal Medicine

## 2015-04-23 DIAGNOSIS — J438 Other emphysema: Secondary | ICD-10-CM | POA: Diagnosis not present

## 2015-04-23 NOTE — Progress Notes (Signed)
Today, [place] exercised at [employer]. [facility]. Service time was from 10:30am to 12:30pm.  The patient exercised by performing aerobic, strengthening, and stretching exercises. Oxygen saturation, heart rate, blood pressure, rate of perceived exertion, and shortness of breath were all monitored before, during, and after exercise. [place] presented with no problems at today's exercise session. The patient attended education today on Holiday Eating with Derek [place].  The patient did not have an increase in workload intensity during today's exercise session.  Pre-exercise vitals: . Weight kg: 53.9 . Liters of O2: 2 . SpO2: 93 . HR: 91 . BP: 110/50 . CBG: na  Exercise vitals: . Highest heartrate:  104 . Lowest oxygen saturation: 84 . Highest blood pressure: 106/56 . Liters of 02: 2  Post-exercise vitals: . SpO2: 91 . HR: 92 . BP: 112/56 . Liters of O2: 2 . CBG: na  Dr. Brand Males, Medical Director Dr. Marily Memos is immediately available during today's Pulmonary Rehab session for [place] on 04/23/15 at 10:30am class time

## 2015-04-28 ENCOUNTER — Encounter (HOSPITAL_COMMUNITY)
Admission: RE | Admit: 2015-04-28 | Discharge: 2015-04-28 | Disposition: A | Discharge status: Home / Self Care | Payer: Medicare Other | Source: Ambulatory Visit | Attending: Internal Medicine | Admitting: Internal Medicine

## 2015-04-28 DIAGNOSIS — J438 Other emphysema: Secondary | ICD-10-CM | POA: Diagnosis not present

## 2015-04-28 NOTE — Progress Notes (Signed)
Today, [name] exercised at [facility]. Service time was from 10:30am to 12:05pm.  The patient exercised by performing aerobic, strengthening, and stretching exercises. Oxygen saturation, heart rate, blood pressure, rate of perceived exertion, and shortness of breath were all monitored before, during, and after exercise. Jill White presented with no problems at today's exercise session.  The patient did have an increase in workload intensity during today's exercise session.  Pre-exercise vitals: . Weight kg: 54.0 . Liters of O2: 2 . SpO2: 91 . HR: 97 . BP: 132/62 . CBG: na  Exercise vitals: . Highest heartrate:  111 . Lowest oxygen saturation: 91 . Highest blood pressure: 150/64 . Liters of 02: 2  Post-exercise vitals: . SpO2: 90 . HR: 96 . BP: 108/62 . Liters of O2: 2 . CBG: na  Dr. Brand Males, Medical Director Dr. Ree Kida is immediately available during today's Pulmonary Rehab session for [place] on 04/28/15 at 10:30am class time.

## 2015-04-30 ENCOUNTER — Encounter (HOSPITAL_COMMUNITY)
Admission: RE | Admit: 2015-04-30 | Discharge: 2015-04-30 | Disposition: A | Discharge status: Home / Self Care | Payer: Medicare Other | Source: Ambulatory Visit | Attending: Internal Medicine | Admitting: Internal Medicine

## 2015-04-30 DIAGNOSIS — J438 Other emphysema: Secondary | ICD-10-CM | POA: Diagnosis not present

## 2015-04-30 NOTE — Progress Notes (Signed)
Today, Jill White exercised at [facility]. Service time was from 10:30am to 12:05pm.  The patient exercised by performing aerobic, strengthening, and stretching exercises. Oxygen saturation, heart rate, blood pressure, rate of perceived exertion, and shortness of breath were all monitored before, during, and after exercise. [name] presented with no problems at today's exercise session. Patient attended education today on COPD.  The patient did have an increase in workload intensity during today's exercise session.  Pre-exercise vitals: . Weight kg: 54.2 . Liters of O2: 2 . SpO2: 90 . HR: 93 . BP: 120/56 . CBG: na  Exercise vitals: . Highest heartrate:  113 . Lowest oxygen saturation: 86 . Highest blood pressure: 132/70 . Liters of 02: 2  Post-exercise vitals: . SpO2: 91 . HR: 97 . BP: 118/62 . Liters of O2: 2 . CBG: na  Dr. Brand Males, Medical Director Dr. Marily Memos is immediately available during today's Pulmonary Rehab session for [name] on 04/30/15 at 10:30am class time

## 2015-05-05 ENCOUNTER — Encounter (HOSPITAL_COMMUNITY)
Admission: RE | Admit: 2015-05-05 | Discharge: 2015-05-05 | Disposition: A | Discharge status: Home / Self Care | Payer: Medicare Other | Source: Ambulatory Visit | Attending: Internal Medicine | Admitting: Internal Medicine

## 2015-05-05 DIAGNOSIS — J438 Other emphysema: Secondary | ICD-10-CM | POA: Diagnosis not present

## 2015-05-05 NOTE — Progress Notes (Signed)
Today, Celisa exercised at [facility]. Service time was from 1030 to 1200.  The patient exercised by performing aerobic, strengthening, and stretching exercises. Oxygen saturation, heart rate, blood pressure, rate of perceived exertion, and shortness of breath were all monitored before, during, and after exercise. Dyna presented with no problems at today's exercise session.  The patient did not have an increase in workload intensity during today's exercise session.  Pre-exercise vitals: . Weight kg: 54.3 . Liters of O2: 2 . SpO2: 95 . HR: 94 . BP: 120/50 . CBG: NA  Exercise vitals: . Highest heartrate:  107 . Lowest oxygen saturation: 90 . Highest blood pressure: 118/60 . Liters of 02: 2  Post-exercise vitals: . SpO2: 91 . HR: 93 . BP: 108/60 . Liters of O2: 2 . CBG: NA Dr. Brand Males, Medical Director Dr. Marily Memos is immediately available during today's Pulmonary Rehab session for [place] on 05/05/2015  at 1030 class time  .

## 2015-05-07 ENCOUNTER — Encounter (HOSPITAL_COMMUNITY)
Admission: RE | Admit: 2015-05-07 | Discharge: 2015-05-07 | Disposition: A | Discharge status: Home / Self Care | Payer: Medicare Other | Source: Ambulatory Visit | Attending: Internal Medicine | Admitting: Internal Medicine

## 2015-05-07 DIAGNOSIS — J438 Other emphysema: Secondary | ICD-10-CM | POA: Diagnosis not present

## 2015-05-07 NOTE — Progress Notes (Signed)
Today, Charolet exercised at [facility]. Service time was from 1030 to 1215.  The patient exercised by performing aerobic, strengthening, and stretching exercises. Oxygen saturation, heart rate, blood pressure, rate of perceived exertion, and shortness of breath were all monitored before, during, and after exercise. Alhana presented with no problems at today's exercise session. Monisa also attended an education session on exercise for the pulmonary patient.  The patient did not have an increase in workload intensity during today's exercise session.  Pre-exercise vitals: . Weight kg: 53.9 . Liters of O2: 2L . SpO2: 95 . HR: 95 . BP: 120/60 . CBG: na  Exercise vitals: . Highest heartrate:  114 . Lowest oxygen saturation: 93 . Highest blood pressure: 140/56 . Liters of 02: 2L  Post-exercise vitals: . SpO2: 94 . HR: 102 . BP: 104/60 . Liters of O2: ra . CBG: na  Dr. Brand Males, Medical Director Dr. Cruzita Lederer is immediately available during today's Pulmonary Rehab session for [place] on 05/07/2015 at 1030 class time.

## 2015-05-12 ENCOUNTER — Encounter (HOSPITAL_COMMUNITY)
Admission: RE | Admit: 2015-05-12 | Discharge: 2015-05-12 | Disposition: A | Discharge status: Home / Self Care | Payer: Medicare Other | Source: Ambulatory Visit | Attending: Internal Medicine | Admitting: Internal Medicine

## 2015-05-12 DIAGNOSIS — J438 Other emphysema: Secondary | ICD-10-CM | POA: Diagnosis not present

## 2015-05-12 NOTE — Progress Notes (Signed)
Today, Jill White exercised at [facility]. Service time was from 1030 to 1215.  The patient exercised by performing aerobic, strengthening, and stretching exercises. Oxygen saturation, heart rate, blood pressure, rate of perceived exertion, and shortness of breath were all monitored before, during, and after exercise. [name] presented with no problems at today's exercise session. Patient attended the Oxygen Safety class today.   The patient did not have an increase in workload intensity during today's exercise session.  Pre-exercise vitals: . Weight kg: 54.0 . Liters of O2: 2 . SpO2: 93 . HR: 90 . BP: 122/64 . CBG: na  Exercise vitals: . Highest heartrate:  95 . Lowest oxygen saturation: 97 . Highest blood pressure: 124/70 . Liters of 02: 2  Post-exercise vitals: . SpO2: 89 . HR: 101 . BP: 132/66 . Liters of O2: ra . CBG: na Dr. Brand Males, Medical Director Dr. Tana Coast is immediately available during today's Pulmonary Rehab session for Jill White on 05/12/2015  at 1030 class time.  Marland Kitchen

## 2015-05-14 ENCOUNTER — Encounter (HOSPITAL_COMMUNITY): Payer: Medicare Other

## 2015-05-18 ENCOUNTER — Encounter: Payer: Self-pay | Admitting: Internal Medicine

## 2015-05-18 ENCOUNTER — Ambulatory Visit (INDEPENDENT_AMBULATORY_CARE_PROVIDER_SITE_OTHER): Payer: Medicare Other | Admitting: Internal Medicine

## 2015-05-18 VITALS — BP 130/78 | HR 98 | Ht 61.0 in | Wt 117.8 lb

## 2015-05-18 DIAGNOSIS — F17200 Nicotine dependence, unspecified, uncomplicated: Secondary | ICD-10-CM

## 2015-05-18 DIAGNOSIS — J449 Chronic obstructive pulmonary disease, unspecified: Secondary | ICD-10-CM

## 2015-05-18 DIAGNOSIS — J018 Other acute sinusitis: Secondary | ICD-10-CM

## 2015-05-18 DIAGNOSIS — J019 Acute sinusitis, unspecified: Secondary | ICD-10-CM | POA: Insufficient documentation

## 2015-05-18 DIAGNOSIS — Z720 Tobacco use: Secondary | ICD-10-CM | POA: Diagnosis not present

## 2015-05-18 MED ORDER — DOXYCYCLINE HYCLATE 100 MG PO TABS: ORAL_TABLET | ORAL | Status: DC

## 2015-05-18 NOTE — Progress Notes (Signed)
Subjective:     Patient ID: Jill White, female   DOB: 1940/11/26, 74 y.o.   MRN: [id number]  HPI    OV 02/19/2015  Chief Complaint  Patient presents with  . Acute Visit    Pt recent d/c from [facility] for COPD exacerbation. Pt c/o increase in SOB, fatigue, prod cough with light yellow mucus, low O2 sats.     74 year old female Gold stage II COPD, history of frequent exacerbations, continued smoking and previous refusal to attend pulmonary rehabilitation.  Admitted 02/01/2015 through 02/06/2015 for COPD exacerbation and discharged on doxycycline per her history. Review of the chart shows several different diagnosis from heart failure due to pneumonia to COPD exacerbation. Review of the chest x-rays during this admission which I personally visualized shows pulmonary edema. A follow-up echo shows that actually her ejection fraction is improved to 55-65 percent with some diastolic dysfunction and associated moderate stenosis. Dr. Dorris Carnes her cardiologist is aware of this and this placed her on surveillance  Since discharge patient as reported by her and by her granddaughter who is with her is much better. However few days ago had a panic attack and therefore made a quick follow-up appointment to see me today. The extremity concerned about panic attack. Currently taking Xanax when necessary. They want to know about long-term solutions panic attack which makes COPD worse. Patient is also petrified about going to the hospital. Apparently [facility] has called a full support but it appears that the nursing interaction over the phone was not done well and therefore she does not want to use this support system. They also worried about pneumonia risk wiTH inhaled steroid but after extensive discussion they prefer to be on 3 inhalers and except the benefit of dyspnea relief. Today she is more open to attending pulmonary rehabilitation   OV 05/18/2015  Chief Complaint  Patient presents with   . Follow-up    Pt following for COPD: pt states she is doing very well. pt states she currently has a sinus infection and c/o post nasal drip, prod cough clear in color.  no c/o SOB wheezing or chest tightness. pt using 02 PRN. DME: [facility]     Smoking:  reports that she has been smoking Cigarettes.  She has smoked for the past 50 years. She has never used smokeless tobacco.  COPD: She is on triple inhaler therapy. Symptoms improved after starting pulmonary rehabilitation. According to the granddaughter Janett Billow because of pulmonary rehabilitation patient is no longer claustrophobic and is able to go out of the house. Her anxiety levels are better. She did try Lexapro for panic attacks but this did not help. So she is just dealing with it. She is following some mindfulness techniques that she saw on Dr. Donne Hazel TV show and this is helping. There is happy about pulmonary rehabilitation.   CAT COPD Symptom & Quality of Life Score (GSK trademark) 0 is no burden. 5 is highest burden 02/19/2015   Never Cough -> Cough all the time 2  No phlegm in chest -> Chest is full of phlegm 2  No chest tightness -> Chest feels very tight 0  No dyspnea for 1 flight stairs/hill -> Very dyspneic for 1 flight of stairs 5  No limitations for ADL at home -> Very limited with ADL at home 4  Confident leaving home -> Not at all confident leaving home 0  Sleep soundly -> Do not sleep soundly because of lung condition 2  Lots of Energy ->  No energy at all 3  TOTAL Score (max 40)  18   Immunization History  Administered Date(s) Administered  . Influenza Split 03/18/2012, 03/20/2013, 04/07/2014  . Influenza Whole 04/20/2005  . Influenza, High Dose Seasonal PF 03/13/2015  . Pneumococcal Polysaccharide-23 03/20/1989, 07/12/2011, 07/12/2011  . Td 01/19/1984  . Zoster 05/24/2011      Current outpatient prescriptions:  .  aspirin 81 MG tablet, Take 81 mg by mouth every morning. Patient takes every other day, Disp: , Rfl:   .  budesonide-formoterol (SYMBICORT) 80-4.5 MCG/ACT inhaler, Inhale 2 puffs into the lungs 2 (two) times daily., Disp: 1 Inhaler, Rfl: 6 .  clonazePAM (KLONOPIN) 1 MG tablet, TAKE 1 TABLET BY MOUTH NIGHTLY AT BEDTIME AS NEEDED, Disp: , Rfl: 4 .  diltiazem (CARDIZEM CD) 120 MG 24 hr capsule, Take 1 capsule (120 mg total) by mouth daily., Disp: 30 capsule, Rfl: 12 .  furosemide (LASIX) 20 MG tablet, TAKE 1.5 TABLETS (30 MG TOTAL) BY MOUTH DAILY., Disp: 45 tablet, Rfl: 3 .  guaiFENesin (MUCINEX) 600 MG 12 hr tablet, Take 600 mg by mouth 2 (two) times daily., Disp: , Rfl:  .  magnesium oxide (MAG-OX) 400 MG tablet, Take 1 tablet (400 mg total) by mouth daily., Disp: 30 tablet, Rfl: 6 .  ondansetron (ZOFRAN) 4 MG tablet, Take 4 mg by mouth every 4 (four) hours as needed for nausea or vomiting., Disp: , Rfl:  .  potassium chloride (K-DUR) 10 MEQ tablet, Take 1 tablet (10 mEq total) by mouth daily., Disp: 90 tablet, Rfl: 3 .  PROAIR HFA 108 (90 BASE) MCG/ACT inhaler, INHALE TWO PUFFS BY MOUTH EVERY FOUR HOURS AS NEEDED (Patient taking differently: INHALE TWO PUFFS BY MOUTH EVERY FOUR HOURS AS NEEDED shortness of breath/wheezing), Disp: 8.5 Inhaler, Rfl: 3 .  promethazine (PHENERGAN) 12.5 MG tablet, TAKE 1 TABLET (12.5 MG TOTAL) BY MOUTH EVERY 6 (SIX) HOURS AS NEEDED FOR NAUSEA OR VOMITING., Disp: , Rfl: 0 .  Respiratory Therapy Supplies (FLUTTER) DEVI, Use as directed, Disp: 1 each, Rfl: 0 .  tiotropium (SPIRIVA) 18 MCG inhalation capsule, Place 1 capsule (18 mcg total) into inhaler and inhale daily., Disp: 30 capsule, Rfl: 12   Review of Systems     Objective:   Physical Exam  Constitutional: She is oriented to person, place, and time. She appears well-developed and well-nourished. No distress.  HENT:  Head: Normocephalic and atraumatic.  Right Ear: External ear normal.  Left Ear: External ear normal.  Mouth/Throat: Oropharynx is clear and moist. No oropharyngeal exudate.  Eyes: Conjunctivae and  EOM are normal. Pupils are equal, round, and reactive to light. Right eye exhibits no discharge. Left eye exhibits no discharge. No scleral icterus.  Neck: Normal range of motion. Neck supple. No JVD present. No tracheal deviation present. No thyromegaly present.  Cardiovascular: Normal rate, regular rhythm and intact distal pulses.  Exam reveals no gallop and no friction rub.   Murmur heard. Baseline cardiac murmur  Pulmonary/Chest: Effort normal and breath sounds normal. No respiratory distress. She has no wheezes. She has no rales. She exhibits no tenderness.  Slightly diminished overall air entry Mild barrel chest  Abdominal: Soft. Bowel sounds are normal. She exhibits no distension and no mass. There is no tenderness. There is no rebound and no guarding.  Musculoskeletal: Normal range of motion. She exhibits no edema or tenderness.  Lymphadenopathy:    She has no cervical adenopathy.  Neurological: She is alert and oriented to person, place, and time. She  has normal reflexes. No cranial nerve deficit. She exhibits normal muscle tone. Coordination normal.  Skin: Skin is warm and dry. No rash noted. She is not diaphoretic. No erythema. No pallor.  Psychiatric: She has a normal mood and affect. Her behavior is normal. Judgment and thought content normal.  Vitals reviewed.  Filed Vitals:   05/18/15 1030  BP: 130/78  Pulse: 98  Height: 5\' 1"  (1.549 m)  Weight: 117 lb 12.8 oz (53.434 kg)  SpO2: 92%        Assessment:       ICD-9-CM ICD-10-CM   1. Chronic obstructive pulmonary disease, unspecified COPD, unspecified chronic bronchitis type 496 J44.9   2. Smoking 305.1 Z72.0   3. Other acute sinusitis 461.8 J01.80        Plan:      #COpd  - stable disease - Glad you're feeling better after starting pulmonary rehabilitation  - Continue Symbicort and Spiriva as before with albuterol as needed - Continue rehabilitation  #Smoking  - Please work on quitting as and when  possible  #Acute sinusitis  - Take doxycycline 100mg  po twice daily x 5 days; take after meals and avoid sunlight  #followup - 6 months or sooner if needed   Dr. Brand Males, M.D., [facility].C.P Pulmonary and Critical Care Medicine Staff Physician [facility] Pager: [phone], If no answer or between  15:00h - 7:00h: call [phone]  05/18/2015 10:58 AM

## 2015-05-18 NOTE — Patient Instructions (Addendum)
ICD-9-CM ICD-10-CM   1. Chronic obstructive pulmonary disease, unspecified COPD, unspecified chronic bronchitis type 496 J44.9   2. Smoking 305.1 Z72.0   3. Other acute sinusitis 461.8 J01.80     #COpd  - stable disease - Glad you're feeling better after starting pulmonary rehabilitation  - Continue Symbicort and Spiriva as before with albuterol as needed - Continue rehabilitation  #Smoking  - Please work on quitting as and when possible  #Acute sinusitis  - Take doxycycline 100mg  po twice daily x 5 days; take after meals and avoid sunlight  #followup - 6 months or sooner if needed

## 2015-05-19 ENCOUNTER — Encounter (HOSPITAL_COMMUNITY): Payer: Medicare Other

## 2015-05-19 ENCOUNTER — Telehealth: Payer: Self-pay | Admitting: Internal Medicine

## 2015-05-19 NOTE — Telephone Encounter (Signed)
Called and spoke to pt. Informed pt MR would not be in the office until 12/5 and would not be able to sign handicap placard until then. Pt verbalized understanding.   Will forward to my box to follow.

## 2015-05-21 ENCOUNTER — Encounter (HOSPITAL_COMMUNITY)
Admission: RE | Admit: 2015-05-21 | Discharge: 2015-05-21 | Disposition: A | Discharge status: Home / Self Care | Payer: Medicare Other | Source: Ambulatory Visit | Attending: Internal Medicine | Admitting: Internal Medicine

## 2015-05-21 DIAGNOSIS — J438 Other emphysema: Secondary | ICD-10-CM | POA: Insufficient documentation

## 2015-05-21 NOTE — Progress Notes (Signed)
Today, Kionna exercised at [employer]. Service time was from 10:30am to 12:10pm.  The patient exercised by performing aerobic, strengthening, and stretching exercises. Oxygen saturation, heart rate, blood pressure, rate of perceived exertion, and shortness of breath were all monitored before, during, and after exercise. Giulianna presented with no problems at today's exercise session. The patient attended education class today with Rosebud Poles on Signs and Symptoms.  The patient did not have an increase in workload intensity during today's exercise session.  Pre-exercise vitals: . Weight kg: 55.0 . Liters of O2: 2 . SpO2: 92 . HR: 92 . BP: 122/60 . CBG: na  Exercise vitals: . Highest heartrate:  106 . Lowest oxygen saturation: 85 . Highest blood pressure: 142/66 . Liters of 02: 2  Post-exercise vitals: . SpO2: 88 . HR: 96 . BP: 124/64 . Liters of O2: ra . CBG: na  Dr. Rush Farmer, Medical Director Dr. Eliseo Squires is immediately available during today's Pulmonary Rehab session for [place] on 05/21/15 at 10:30am class time

## 2015-05-21 NOTE — Telephone Encounter (Signed)
Awaiting MR's return to office on 05/25/15

## 2015-05-26 ENCOUNTER — Encounter (HOSPITAL_COMMUNITY)
Admission: RE | Admit: 2015-05-26 | Discharge: 2015-05-26 | Disposition: A | Discharge status: Home / Self Care | Payer: Medicare Other | Source: Ambulatory Visit | Attending: Internal Medicine | Admitting: Internal Medicine

## 2015-05-26 DIAGNOSIS — J438 Other emphysema: Secondary | ICD-10-CM | POA: Diagnosis not present

## 2015-05-26 NOTE — Progress Notes (Signed)
Today, Jherica exercised at [employer] [facility]. Service time was from 10:30am to 12:10pm.  The patient exercised by performing aerobic, strengthening, and stretching exercises. Oxygen saturation, heart rate, blood pressure, rate of perceived exertion, and shortness of breath were all monitored before, during, and after exercise. Kenli presented with no problems at today's exercise session.  The patient did not have an increase in workload intensity during today's exercise session.  Pre-exercise vitals: . Weight kg: 54.9 . Liters of O2: 2 . SpO2: 93 . HR: 96 . BP: 100/64 . CBG: na  Exercise vitals: . Highest heartrate:  118 . Lowest oxygen saturation: 83 . Highest blood pressure: 118/60 . Liters of 02: 2  Post-exercise vitals: . SpO2: 89 . HR: 110 . BP: 110/60 . Liters of O2: 2 . CBG: na  Dr. Rush Farmer, Medical Director Dr. Marily Memos is immediately available during today's Pulmonary Rehab session for [place] on 05/26/15 at 10:30am class time

## 2015-05-26 NOTE — Progress Notes (Signed)
I have reviewed a Home Exercise Prescription with Jill White . Jill White is not currently exercising at home.  The patient was advised to walk 2-3 days a week for 20-25 minutes.  Jill White and I discussed how to progress their exercise prescription.  The patient stated that their goals were to build strength and better breathing techniques.  The patient stated that they understand the exercise prescription.  We reviewed exercise guidelines, target heart rate during exercise, oxygen use, weather, home pulse oximeter, endpoints for exercise, and goals.  Patient is encouraged to come to me with any questions. I will continue to follow up with the patient to assist them with progression and safety.

## 2015-05-27 NOTE — Telephone Encounter (Signed)
Did not see forms in MR's box, did you already take care of this [name]?

## 2015-05-27 NOTE — Telephone Encounter (Signed)
[name], was this ever done? Please advise thanks

## 2015-05-28 ENCOUNTER — Encounter (HOSPITAL_COMMUNITY)
Admission: RE | Admit: 2015-05-28 | Discharge: 2015-05-28 | Disposition: A | Discharge status: Home / Self Care | Payer: Medicare Other | Source: Ambulatory Visit | Attending: Internal Medicine | Admitting: Internal Medicine

## 2015-05-28 DIAGNOSIS — J438 Other emphysema: Secondary | ICD-10-CM | POA: Diagnosis not present

## 2015-05-28 NOTE — Telephone Encounter (Signed)
[phone] leave detailed msg so we can quit the phone tag

## 2015-05-28 NOTE — Telephone Encounter (Signed)
lmtcb x1 for pt. 

## 2015-05-28 NOTE — Telephone Encounter (Signed)
[phone] pt cb

## 2015-05-28 NOTE — Telephone Encounter (Signed)
[phone] cb to check on placcard, per [name] MR has form with him until he returns to office

## 2015-05-28 NOTE — Progress Notes (Signed)
Today, Jessyka exercised at [employer] [facility]. Service time was from 10:30am to 12:15pm.  The patient exercised by performing aerobic, strengthening, and stretching exercises. Oxygen saturation, heart rate, blood pressure, rate of perceived exertion, and shortness of breath were all monitored before, during, and after exercise. Jill White presented with no problems at today's exercise session. The patient attended education today with Trish Fountain on Anatomy and Physiology.  The patient did not have an increase in workload intensity during today's exercise session.  Pre-exercise vitals: . Weight kg: 54.9 . Liters of O2: 2 . SpO2: 92 . HR: 96 . BP: 110/56 . CBG: na  Exercise vitals: . Highest heartrate:  104 . Lowest oxygen saturation: 84 . Highest blood pressure: 126/80 . Liters of 02: 2  Post-exercise vitals: . SpO2: 90 . HR: 95 . BP: 102/60 . Liters of O2: 2 . CBG: na  Dr. Rush Farmer, Medical Director Dr. Frederic Jericho is immediately available during today's Pulmonary Rehab session for [place] on 05/28/15 at 10:30am class time.

## 2015-05-28 NOTE — Telephone Encounter (Signed)
Spoke with the pt and notified that MR has the form and it will more than likely be ready to pick up once he returns to clinic on 06/01/15 We will call her once it's ready Pt verbalized understanding and nothing further needed Will forward to EN to keep an eye out for, thanks

## 2015-05-28 NOTE — Telephone Encounter (Signed)
lmtcb x2 for pt. 

## 2015-06-01 ENCOUNTER — Telehealth (HOSPITAL_COMMUNITY): Payer: Self-pay | Admitting: *Deleted

## 2015-06-01 ENCOUNTER — Ambulatory Visit: Payer: Medicare Other | Admitting: Internal Medicine

## 2015-06-01 ENCOUNTER — Telehealth: Payer: Self-pay | Admitting: *Deleted

## 2015-06-01 NOTE — Telephone Encounter (Signed)
Called to speak with patient who did not arrive for her appointment with Dr. Harrington Challenger this afternoon. Her daughter answered and said didn't you get the message my daughter left yesterday?  She is sick with a stomach virus, we have been staying with her since Saturday.  Daughter stated we made sure we called to avoid the no show/cancellation fee.  Advised that the message must not have been received because she is still scheduled today for appointment. Asked that patient call back to reschedule when she is feeling better.

## 2015-06-02 ENCOUNTER — Encounter (HOSPITAL_COMMUNITY): Admission: RE | Admit: 2015-06-02 | Payer: Medicare Other | Source: Ambulatory Visit

## 2015-06-02 ENCOUNTER — Encounter: Payer: Self-pay | Admitting: Internal Medicine

## 2015-06-04 ENCOUNTER — Encounter (HOSPITAL_COMMUNITY): Payer: Medicare Other

## 2015-06-04 NOTE — Telephone Encounter (Signed)
Called and spoke to pt's grand daughter. Informed her that the pts handicap placard is ready for pick up. Placard placed up front for pick up.  Pt's granddaughter verbalized understanding and denied any further questions or concerns at this time.

## 2015-06-08 ENCOUNTER — Telehealth: Payer: Self-pay | Admitting: Internal Medicine

## 2015-06-08 NOTE — Telephone Encounter (Signed)
Left a message for the patient to call back.  

## 2015-06-08 NOTE — Telephone Encounter (Signed)
Pt c/o swelling: STAT is pt has developed SOB within 24 hours  1. How long have you been experiencing swelling? 2 days  2. Where is the swelling located? Both ankles  3.  Are you currently taking a "fluid pill"? Yes- has taken today   4.  Are you currently SOB? Has COPD- stated has intensified in the last 2 days   5.  Have you traveled recently? No

## 2015-06-09 ENCOUNTER — Encounter (HOSPITAL_COMMUNITY)
Admission: RE | Admit: 2015-06-09 | Discharge: 2015-06-09 | Disposition: A | Discharge status: Home / Self Care | Payer: Medicare Other | Source: Ambulatory Visit | Attending: Internal Medicine | Admitting: Internal Medicine

## 2015-06-09 DIAGNOSIS — J438 Other emphysema: Secondary | ICD-10-CM | POA: Diagnosis not present

## 2015-06-09 NOTE — Progress Notes (Signed)
Today, Jill White exercised at [employer] [facility]. Service time was from 10:30am to 12:10pm.  The patient exercised by performing aerobic, strengthening, and stretching exercises. Oxygen saturation, heart rate, blood pressure, rate of perceived exertion, and shortness of breath were all monitored before, during, and after exercise. Jill White presented with no problems at today's exercise session.  The patient did not have an increase in workload intensity during today's exercise session.  Pre-exercise vitals: . Weight kg: 53.0 . Liters of O2: 2 . SpO2: 90 . HR: 97 . BP: 108/64 . CBG: na  Exercise vitals: . Highest heartrate:  104 . Lowest oxygen saturation: 89 . Highest blood pressure: 100/60 . Liters of 02: 2  Post-exercise vitals: . SpO2: 92 . HR: 92 . BP: 110/60 . Liters of O2: 2 . CBG: na  Dr. Rush Farmer, Medical Director Dr. Marily Memos is immediately available during today's Pulmonary Rehab session for Jill White on 06/09/15 at 10:30am class time

## 2015-06-09 NOTE — Telephone Encounter (Signed)
Left message on voice mail for patient to call back if still needs to speak with [place].

## 2015-06-11 ENCOUNTER — Encounter (HOSPITAL_COMMUNITY)
Admission: RE | Admit: 2015-06-11 | Discharge: 2015-06-11 | Disposition: A | Discharge status: Home / Self Care | Payer: Medicare Other | Source: Ambulatory Visit | Attending: Internal Medicine | Admitting: Internal Medicine

## 2015-06-11 DIAGNOSIS — J438 Other emphysema: Secondary | ICD-10-CM | POA: Diagnosis not present

## 2015-06-11 NOTE — Progress Notes (Signed)
Today, Jill White exercised at [employer] [facility]. Service time was from 10:30am to 12:00pm.  The patient exercised by performing aerobic, strengthening, and stretching exercises. Oxygen saturation, heart rate, blood pressure, rate of perceived exertion, and shortness of breath were all monitored before, during, and after exercise. Jill White presented with no problems at today's exercise session. The patient attended education today with [facility] Ileen Kahre on Pursed Lip and Diaphragmatic Breathing.  The patient did not have an increase in workload intensity during today's exercise session.  Pre-exercise vitals: . Weight kg: 54.9 . Liters of O2: 2 . SpO2: 89 . HR: 99 . BP: 108/56 . CBG: na  Exercise vitals: . Highest heartrate:  112 . Lowest oxygen saturation: 89 . Highest blood pressure: 118/72 . Liters of 02: 2  Post-exercise vitals: . SpO2: 90 . HR: 97 . BP: 110/56 . Liters of O2: 2 . CBG: na  Dr. Rush Farmer, Medical Director Dr. Clementeen Graham is immediately available during today's Pulmonary Rehab session for Jill White on 06/11/15 at 10:30am class time.

## 2015-06-16 ENCOUNTER — Encounter (HOSPITAL_COMMUNITY)
Admission: RE | Admit: 2015-06-16 | Discharge: 2015-06-16 | Disposition: A | Discharge status: Home / Self Care | Payer: Medicare Other | Source: Ambulatory Visit | Attending: Internal Medicine | Admitting: Internal Medicine

## 2015-06-16 DIAGNOSIS — J438 Other emphysema: Secondary | ICD-10-CM | POA: Diagnosis not present

## 2015-06-16 NOTE — Progress Notes (Addendum)
Today, Lemon exercised at [employer]. Service time was from 10:30am to 11:55am.  The patient exercised by performing aerobic, strengthening, and stretching exercises. Oxygen saturation, heart rate, blood pressure, rate of perceived exertion, and shortness of breath were all monitored before, during, and after exercise. Jill White presented with increased SOB, decreased saturations, productive cough x 2 days at today's exercise session. She denied fever or chills. Appointment was made for her to see Dr. Halford Chessman tomorrow, Wednesday at 3:30pm.  The patient did not have an increase in workload intensity during today's exercise session.  Pre-exercise vitals: . Weight kg: 55.4 . Liters of O2: 2 . SpO2: 88 . HR: 101 . BP: 122/60 . CBG: na  Exercise vitals: . Highest heartrate:  112 . Lowest oxygen saturation: 83 . Highest blood pressure: 112/54 . Liters of 02: 3  Post-exercise vitals: . SpO2: 91 . HR: 100 . BP: 108/64 . Liters of O2: 2 . CBG: na  Dr. Rush Farmer, Medical Director Dr. Marily Memos is immediately available during today's Pulmonary Rehab session for [place] on 06/16/15 at 10:30am class time

## 2015-06-16 NOTE — Progress Notes (Signed)
Jill White 74 y.o. female Nutrition Note Spoke with pt. Pt is at a normal wt. Pt with slow, chronic wt loss over the past year of 13.6 lb. Pt wants to maintain her wt around 115-120 lb. Pt eats small, frequent meals; most meals prepared at home. There are some ways the pt can make her eating habits healthier.  Pt's Rate Your Plate results reviewed with pt. No changes to pt diet recommended due to wt loss over the past year. Pt avoids most salty food; does not use canned/ convenience food often.  Pt adds salt to food occasionally.  The role of sodium in lung disease reviewed with pt. Pt expressed understanding of the information reviewed.  Vitals - 1 value per visit 05/18/2015 04/13/2015 02/19/2015 02/16/2015  Weight (lb) 117.8 117.51 119.4 118   Vitals - 1 value per visit 02/06/2015 02/02/2015 02/01/2015 01/26/2015 01/01/2015  Weight (lb)  132  123 123   Vitals - 1 value per visit 12/09/2014 09/30/2014 09/29/2014 07/23/2014 07/09/2014  Weight (lb) 118 116 115.6 114 121   Vitals - 1 value per visit 07/01/2014 05/22/2014 05/18/2014 05/12/2014  Weight (lb) 115.6 126.8     Vitals - 1 value per visit 05/05/2014  Weight (lb) 131.4   Nutrition Diagnosis ? Food-and nutrition-related knowledge deficit related to lack of exposure to information as related to diagnosis of pulmonary disease ?  Nutrition Rx/Est. Daily Nutrition Needs for: ? wt maintenance 1500-1700 Kcal  80-95 gm protein   1500 mg or less sodium Nutrition Intervention ? Pt's individual nutrition plan and goals reviewed with pt. ? Benefits of adopting healthy eating habits discussed when pt's Rate Your Plate reviewed. ? Pt to attend the Nutrition and Lung Disease class ? Continual client-centered nutrition education by RD, as part of interdisciplinary care. Goal(s) 1. Identify food quantities necessary to achieve maintain wt around 115-120 lb at graduation from pulmonary rehab. Monitor and Evaluate progress toward nutrition goal with team.    Derek Mound, M.Ed, RD, LDN, CDE 06/16/2015 12:10 PM

## 2015-06-17 ENCOUNTER — Ambulatory Visit (INDEPENDENT_AMBULATORY_CARE_PROVIDER_SITE_OTHER): Payer: Medicare Other | Admitting: Pulmonary Disease

## 2015-06-17 ENCOUNTER — Encounter: Payer: Self-pay | Admitting: Pulmonary Disease

## 2015-06-17 VITALS — BP 116/60 | HR 90 | Ht 61.0 in | Wt 117.4 lb

## 2015-06-17 DIAGNOSIS — J441 Chronic obstructive pulmonary disease with (acute) exacerbation: Secondary | ICD-10-CM | POA: Diagnosis not present

## 2015-06-17 MED ORDER — AZITHROMYCIN 250 MG PO TABS: ORAL_TABLET | ORAL | Status: DC

## 2015-06-17 NOTE — Progress Notes (Signed)
Current Outpatient Prescriptions on File Prior to Visit  Medication Sig  . aspirin 81 MG tablet Take 81 mg by mouth every morning. Patient takes every other day  . budesonide-formoterol (SYMBICORT) 80-4.5 MCG/ACT inhaler Inhale 2 puffs into the lungs 2 (two) times daily.  . clonazePAM (KLONOPIN) 1 MG tablet TAKE 1 TABLET BY MOUTH NIGHTLY AT BEDTIME AS NEEDED  . diltiazem (CARDIZEM CD) 120 MG 24 hr capsule Take 1 capsule (120 mg total) by mouth daily.  . furosemide (LASIX) 20 MG tablet TAKE 1.5 TABLETS (30 MG TOTAL) BY MOUTH DAILY.  Marland Kitchen guaiFENesin (MUCINEX) 600 MG 12 hr tablet Take 600 mg by mouth 2 (two) times daily.  . magnesium oxide (MAG-OX) 400 MG tablet Take 1 tablet (400 mg total) by mouth daily.  . ondansetron (ZOFRAN) 4 MG tablet Take 4 mg by mouth every 4 (four) hours as needed for nausea or vomiting.  . potassium chloride (K-DUR) 10 MEQ tablet Take 1 tablet (10 mEq total) by mouth daily.  Marland Kitchen PROAIR HFA 108 (90 BASE) MCG/ACT inhaler INHALE TWO PUFFS BY MOUTH EVERY FOUR HOURS AS NEEDED (Patient taking differently: INHALE TWO PUFFS BY MOUTH EVERY FOUR HOURS AS NEEDED shortness of breath/wheezing)  . promethazine (PHENERGAN) 12.5 MG tablet TAKE 1 TABLET (12.5 MG TOTAL) BY MOUTH EVERY 6 (SIX) HOURS AS NEEDED FOR NAUSEA OR VOMITING.  Marland Kitchen Respiratory Therapy Supplies (FLUTTER) DEVI Use as directed  . tiotropium (SPIRIVA) 18 MCG inhalation capsule Place 1 capsule (18 mcg total) into inhaler and inhale daily.   No current facility-administered medications on file prior to visit.     Chief Complaint  Patient presents with  . Acute Visit    MR pt. Pt c/o prod cough with yellow mucus, increased SOB, and wheezing x 5 days.      Past medical hx HTN, RLS, PUD, LBBB, MR, AS, Diastolic CHF  Past surgical hx, Allergies, Family hx, Social hx all reviewed.  Vital Signs BP 116/60 mmHg  Pulse 90  Ht 5\' 1"  (1.549 m)  Wt 117 lb 6.4 oz (53.252 kg)  BMI 22.19 kg/m2  SpO2 89%  History of Present  Illness Jill White is a 74 y.o. female smoker with COPD.  She is followed by Dr. Chase Caller.  She is here for an acute evaluation.  She started getting more congested two days before Christmas.  She has been feeling more short of breath.  She is congested in her sinuses and has felt nauseous.  She is wheezing intermittently.  She turned her oxygen up to 3 liters >> usually is on 2 liters.  She has been using mucinex and flutter valve.  She had more leg swelling over weekend, but better now.  She is not sure if she had fetter.  She still smokes and is not interested in quitting.  She reports that she is deathly allergic to prednisone and can never take this.    Physical Exam  General - sitting in wheelchair, smells of cigarette smoke, wearing oxygen ENT - b/l maxillary sinus tenderness, no oral exudate, no LAN Cardiac - s1s2 regular, no murmur Chest - faint b/l rhonchi that clear with cough, no wheeze Back - No focal tenderness Abd - Soft, non-tender Ext - No edema Neuro - Normal strength Skin - venous stasis changes Psych - normal mood, and behavior   Assessment/Plan  Acute COPD exacerbation with possible sinusitis. Plan: - will give course of zithromax - she reports allergy to steroids - continue mucinex, flutter valve -  continue inhalers - discussed warning symptoms to monitor for that would indicate she needs to go to hospital  Chronic hypoxic/hypercapnic respiratory failure. Plan: - okay to continue 3 liters oxygen for now  Tobacco abuse. Plan: - she is not interested in smoking cessation   Patient Instructions  Zithromax 250 mg pills >> 2 pills on day 1, then 1 pill daily for next 4 days  Call if not feeling better  Follow up with Dr. Chase Caller as previously scheduled     Chesley Mires, MD [place] Pager:  [phone]

## 2015-06-17 NOTE — Patient Instructions (Signed)
Zithromax 250 mg pills >> 2 pills on day 1, then 1 pill daily for next 4 days  Call if not feeling better  Follow up with Dr. Chase Caller as previously scheduled

## 2015-06-18 ENCOUNTER — Encounter (HOSPITAL_COMMUNITY): Payer: Medicare Other

## 2015-06-18 ENCOUNTER — Telehealth: Payer: Self-pay | Admitting: Internal Medicine

## 2015-06-18 MED ORDER — DILTIAZEM HCL 30 MG PO TABS: 30.0000 mg | ORAL_TABLET | Freq: Two times a day (BID) | ORAL | Status: DC

## 2015-06-18 NOTE — Telephone Encounter (Signed)
Spoke with Dr. Burt Knack, DOD who advised patient may take diltiazem hcl 30 mg twice daily to see if she tolerates better.  I called and advised patient's granddaughter and advised her that I will route message to Dr. Harrington Challenger so that she is aware of change.  Rx sent to patient's pharmacy.  Granddaughter states she will continue to monitor heart rate and BP and will call with questions or concerns.  She thanked me for our help.

## 2015-06-18 NOTE — Telephone Encounter (Signed)
Pt c/o medication issue:  1. Name of Medication: Cardiezum 120mg  2. How are you currently taking this medication (dosage and times per day)? 1x day 3. Are you having a reaction (difficulty breathing--STAT)? no  4. What is your medication issue? She feels out of it and wants to lower the dose   New pharmacy: [facility] on [address]

## 2015-06-18 NOTE — Telephone Encounter (Signed)
Left message for patient to call back  

## 2015-06-18 NOTE — Telephone Encounter (Signed)
Spoke with patient's granddaughter who is with patient and states patient is very lethargic after taking Cardizem 120 mg. She states she has tried giving it to her at different times during the day but states patient needs it in the morning or her heart rate speeding up makes her anxious.  She states patient's heart rate is usually 76-88 bpm.  States BP 116/62 mmHg yesterday. She does not take any other BP or antiarrythmic medications. Patient had an appointment in December with Dr. Harrington Challenger but was unable to come in due to having a stomach virus.  She is scheduled to follow-up on 2/24, which is next available appointment.  She does not want to see an APP.  Granddaughter states this has been going on for several months, but states patient is currently suffering from URI and lethargy is worse due to respiratory symptoms.  She requests lower dose of Cardizem.  I advised her that Dr. Harrington Challenger is out of the office until next Wednesday but I can discuss with Dr. Burt Knack, DOD.  She verbalized understanding and agreement with plan.

## 2015-06-20 ENCOUNTER — Other Ambulatory Visit: Payer: Self-pay | Admitting: Internal Medicine

## 2015-06-20 ENCOUNTER — Telehealth: Payer: Self-pay | Admitting: Internal Medicine

## 2015-06-20 NOTE — Telephone Encounter (Signed)
Daughter called saying pt's cough and breathing are worse p rx by Dr Halford Chessman in office with Zpak and no purulent secretions but  using saba hfa up to every few hours and "allergic to most everything else" including albuterol neb and steroids so nothing to offer over the phone > rec UC or ER

## 2015-06-23 ENCOUNTER — Telehealth: Payer: Self-pay | Admitting: Pulmonary Disease

## 2015-06-23 ENCOUNTER — Encounter: Payer: Self-pay | Admitting: Pulmonary Disease

## 2015-06-23 ENCOUNTER — Telehealth: Payer: Self-pay | Admitting: Internal Medicine

## 2015-06-23 ENCOUNTER — Ambulatory Visit (INDEPENDENT_AMBULATORY_CARE_PROVIDER_SITE_OTHER): Payer: Medicare Other | Admitting: Pulmonary Disease

## 2015-06-23 ENCOUNTER — Encounter (HOSPITAL_COMMUNITY): Payer: Medicare Other

## 2015-06-23 ENCOUNTER — Ambulatory Visit (INDEPENDENT_AMBULATORY_CARE_PROVIDER_SITE_OTHER)
Admission: RE | Admit: 2015-06-23 | Discharge: 2015-06-23 | Disposition: A | Discharge status: Home / Self Care | Payer: Medicare Other | Source: Ambulatory Visit | Attending: Pulmonary Disease | Admitting: Pulmonary Disease

## 2015-06-23 VITALS — BP 124/68 | HR 98 | Temp 97.7°F | Ht 61.0 in | Wt 115.0 lb

## 2015-06-23 DIAGNOSIS — J441 Chronic obstructive pulmonary disease with (acute) exacerbation: Secondary | ICD-10-CM

## 2015-06-23 MED ORDER — METHYLPREDNISOLONE ACETATE 80 MG/ML IJ SUSP
80.0000 mg | Freq: Once | INTRAMUSCULAR | Status: AC
  Administered 2015-06-23: 80 mg via INTRAMUSCULAR

## 2015-06-23 NOTE — Addendum Note (Signed)
Addended by: Beckie Busing on: 06/23/2015 02:13 PM   Modules accepted: Orders

## 2015-06-23 NOTE — Telephone Encounter (Signed)
Spoke with patient's granddaughter and reviewed Dr. Alan Ripper advice.  Patient is scheduled to see Dr. Harrington Challenger on 1/9.

## 2015-06-23 NOTE — Telephone Encounter (Signed)
I spoke with pt's granddaughter, Janett Billow. Janett Billow states pt has been more SOB the last few days when she lies down, she does note she also has COPD.  Janett Billow states pt does not weigh regularly. Janett Billow states pt  last weighed 06/17/15 -117 lbs. Janett Billow states this is a pretty  usual weight for pt. I asked Janett Billow to weight pt today and told her I would hold on the phone  while pt weighed.   Janett Billow asked to call me back.

## 2015-06-23 NOTE — Telephone Encounter (Signed)
I reviewed with Ellen Henri, PA-- Per [place] Take extra dose of lasix 20mg  today only, continue to weigh daily, call if symptoms do not improve, see what pulmonary says today at appt.   Pt's granddaughter, Janett Billow advised, verbalized understanding, is comfortable and agreed  with plan.

## 2015-06-23 NOTE — Progress Notes (Signed)
Subjective:    Patient ID: Jill White, female    DOB: Aug 10, 1940, 75 y.o.   MRN: [id number]  HPI Chief Complaint  Patient presents with  . Acute Visit    MR pt-saw VS on 12/28 for increased prod cough with yellow mucus, SOB, wheezing.  Given Zpak, s/s still worsening.     Emeryn had a sinus infection last week, treated with zpack by Dr. Halford Chessman. Sinus infection better, but more dyspneic, lots of chest congestion.  She is using the oxygen 24/7 but typically only uses it prn. No fever, no chest pain. Some swelling in ankles a few weeks ago, better after increased lasix.   Past Medical History  Diagnosis Date  . HIP PAIN   . HYPERTENSION   . Restless leg syndrome   . COPD (chronic obstructive pulmonary disease) (HCC)     a. Multiple prior admissions for acute hypoxic respiratory failure in setting of COPD exacerbations +/- pneumonia.  . Peptic ulcer disease     a. 2013: gastric antrum.  Marland Kitchen Hyponatremia   . Tobacco abuse   . Hypomagnesemia   . LBBB (left bundle branch block)     a. Intermittent, likely rate related.  . Pulmonary HTN ([place])     a. Elevated PA pressure by prior echoes.  . Mitral regurgitation     a. Mild MR by echo 2014, not mentioned on 11/2013 (severely calcified annulus).  Marland Kitchen Respiratory failure ([place])     a. Adm 11/2013 with severe hypercarbic and hypoxemic respiratory failure requiring intubation.   . Abnormal echocardiogram     a. 11/2013: decreased EF 45-50%, moderate AS- > subsequent cath 12/02/13 with mildly calcified cors without obstruction, mild AS, EF 60%, mild pulm HTN.  Marland Kitchen Aortic stenosis     a. 11/2013: mod by echo, then mild by cath.  . Pulmonary HTN ([place])     a. 11/2013: mild by cath.  . Wide-complex tachycardia ([place])     a. Felt likely atrial tach with rate-related LBBB.  Marland Kitchen Chronic diastolic CHF (congestive heart failure) ([place])   . Diverticulitis of colon (without mention of hemorrhage) 08/09/2013  . Septic shock ([place]) 12/11/2013  . Acute on  chronic combined systolic and diastolic congestive heart failure, NYHA class 4 ([place]) 11/27/2013      Review of Systems  Constitutional: Positive for fatigue. Negative for fever and chills.  HENT: Negative for nosebleeds, postnasal drip and rhinorrhea.   Respiratory: Positive for cough, shortness of breath and wheezing.   Cardiovascular: Negative for chest pain, palpitations and leg swelling.       Objective:   Physical Exam Filed Vitals:   06/23/15 1332  BP: 124/68  Pulse: 98  Temp: 97.7 F (36.5 C)  TempSrc: Oral  Height: 5\' 1"  (1.549 m)  Weight: 115 lb (52.164 kg)  SpO2: 84%   2L [place] > increased after resting  Gen: chronically ill appearing HENT: OP clear, TM's clear, neck supple PULM: Mild wheeze, good air movement, normal percussion CV: RRR, systolic murmur, trace edema GI: BS+, soft, nontender Derm: no cyanosis or rash Psyche: normal mood and affect  Old records reviewed, she has moderate airflow obstruction on PFTs See by my partner last week were she was given a Z-Pak Recent chest imaging in 2016 shows emphysema.      Assessment & Plan:  COPD exacerbation She continues to have symptoms of a COPD exacerbation, most of which today her symptoms are ongoing chest congestion. She seems to be producing more  mucus today as she has coughed up several times in clinic after taking an increased dose of Mucinex. She adamantly refuses any sort of oral steroid because of severe side effects she's had in the past. She is no longer producing. Mucus and she does not have infectious signs and symptoms. However, she does have some crackles on exam so I think we need to get a chest x-ray to rule out pneumonia.  Plan: Steroid shot here in clinic (she says that these tend to work better for her) Use Mucinex 1200 mg twice a day Keep using Symbicort and Spiriva Use albuterol every 4 hours for the next few days Chest x-ray today to ensure there is no evidence of pneumonia Go to the  emergency room if you get worse, instructed at length today. I offered hospital admission and she said that she didn't want to go.     Current outpatient prescriptions:  .  aspirin 81 MG tablet, Take 81 mg by mouth every morning. Patient takes every other day, Disp: , Rfl:  .  budesonide-formoterol (SYMBICORT) 80-4.5 MCG/ACT inhaler, Inhale 2 puffs into the lungs 2 (two) times daily., Disp: 1 Inhaler, Rfl: 6 .  clonazePAM (KLONOPIN) 1 MG tablet, TAKE 1 TABLET BY MOUTH NIGHTLY AT BEDTIME AS NEEDED, Disp: , Rfl: 4 .  diltiazem (CARDIZEM) 30 MG tablet, Take 1 tablet (30 mg total) by mouth 2 (two) times daily., Disp: 60 tablet, Rfl: 11 .  furosemide (LASIX) 20 MG tablet, TAKE 1.5 TABLETS (30 MG TOTAL) BY MOUTH DAILY., Disp: 45 tablet, Rfl: 3 .  guaiFENesin (MUCINEX) 600 MG 12 hr tablet, Take 600 mg by mouth 2 (two) times daily., Disp: , Rfl:  .  magnesium oxide (MAG-OX) 400 MG tablet, Take 1 tablet (400 mg total) by mouth daily., Disp: 30 tablet, Rfl: 6 .  ondansetron (ZOFRAN) 4 MG tablet, Take 4 mg by mouth every 4 (four) hours as needed for nausea or vomiting., Disp: , Rfl:  .  potassium chloride (K-DUR) 10 MEQ tablet, Take 1 tablet (10 mEq total) by mouth daily., Disp: 90 tablet, Rfl: 3 .  PROAIR HFA 108 (90 BASE) MCG/ACT inhaler, INHALE TWO PUFFS BY MOUTH EVERY FOUR HOURS AS NEEDED (Patient taking differently: INHALE TWO PUFFS BY MOUTH EVERY FOUR HOURS AS NEEDED shortness of breath/wheezing), Disp: 8.5 Inhaler, Rfl: 3 .  promethazine (PHENERGAN) 12.5 MG tablet, TAKE 1 TABLET (12.5 MG TOTAL) BY MOUTH EVERY 6 (SIX) HOURS AS NEEDED FOR NAUSEA OR VOMITING., Disp: , Rfl: 0 .  Respiratory Therapy Supplies (FLUTTER) DEVI, Use as directed, Disp: 1 each, Rfl: 0 .  tiotropium (SPIRIVA) 18 MCG inhalation capsule, Place 1 capsule (18 mcg total) into inhaler and inhale daily., Disp: 30 capsule, Rfl: 12

## 2015-06-23 NOTE — Telephone Encounter (Signed)
Jill White states weight today 118.7 today, last weight 117 lbs 06/17/15. Jill White states pt denies increase in  lower extremity edema.

## 2015-06-23 NOTE — Telephone Encounter (Signed)
Janett Billow states pt thinks she is in heart failure because she is only more SOB when she lays down, her O2 Sat is 94, Janett Billow states this is okay for pt.  Janett Billow states pt denies any other heart failure symptoms.  Janett Billow states pt had been prescribed a Zpak 06/17/15 for respiratory symptoms. Janett Billow states pt does have an appt today with pulmonary MD.

## 2015-06-23 NOTE — Telephone Encounter (Signed)
Called and spoke with patient's granddaughter. Informed her that we have not received pt's results but once received we will call her with BQ's results and recs. Pt's granddaughter voiced understanding and had no further questions.

## 2015-06-23 NOTE — Assessment & Plan Note (Signed)
She continues to have symptoms of a COPD exacerbation, most of which today her symptoms are ongoing chest congestion. She seems to be producing more mucus today as she has coughed up several times in clinic after taking an increased dose of Mucinex. She adamantly refuses any sort of oral steroid because of severe side effects she's had in the past. She is no longer producing. Mucus and she does not have infectious signs and symptoms. However, she does have some crackles on exam so I think we need to get a chest x-ray to rule out pneumonia.  Plan: Steroid shot here in clinic (she says that these tend to work better for her) Use Mucinex 1200 mg twice a day Keep using Symbicort and Spiriva Use albuterol every 4 hours for the next few days Chest x-ray today to ensure there is no evidence of pneumonia Go to the emergency room if you get worse, instructed at length today. I offered hospital admission and she said that she didn't want to go.

## 2015-06-23 NOTE — Telephone Encounter (Signed)
Agree with plan for 30 bid   Place as add on clinic for day when I do not have more than 22.

## 2015-06-23 NOTE — Telephone Encounter (Signed)
Pt c/o Shortness Of Breath: STAT if SOB developed within the last 24 hours or pt is noticeably SOB on the phone  1. Are you currently SOB (can you hear that pt is SOB on the phone)? Yes  2. How long have you been experiencing SOB? 12.27.16  3. Are you SOB when sitting or when up moving around? both  4. Are you currently experiencing any other symptoms? Nausea/she did have some swelling, but it is under control  Pt's daughter is calling and want pt to come in office today because she thinks the pt is experiencing CHF.

## 2015-06-23 NOTE — Patient Instructions (Signed)
Take Mucinex 12 hour release, 1200 mg twice a day Use her oxygen 24 hours a day for the next few days Use albuterol every 4-6 hours as needed for shortness of breath Call us or go to the emergency room if your shortness of breath gets worse, or if you develop chest pain, fever We will see you back as previously scheduled

## 2015-06-23 NOTE — Telephone Encounter (Signed)
Follow up   Weight: 118.7  No swelling in ankles   Pt granddaughter is calling back for RN and to give up date

## 2015-06-25 ENCOUNTER — Other Ambulatory Visit: Payer: Self-pay | Admitting: Family Medicine

## 2015-06-25 ENCOUNTER — Encounter (HOSPITAL_COMMUNITY): Payer: Medicare Other

## 2015-06-25 NOTE — Telephone Encounter (Signed)
Granddaughter [facility] [place] [facility] [address] [facility]. Now she has transferred them to [facility] on [address]. Now, b/c she has transferred them once already she cannot move the clonopin to [facility]. All of the others transferred but the clonopin had 3 refills left on it and they will not give her back the Rx. She needs to come pick up another.

## 2015-06-26 MED ORDER — CLONAZEPAM 1 MG PO TABS: ORAL_TABLET | ORAL | Status: DC

## 2015-06-26 NOTE — Telephone Encounter (Signed)
done

## 2015-06-26 NOTE — Telephone Encounter (Signed)
Rx was fax and pt is aware

## 2015-06-29 ENCOUNTER — Ambulatory Visit: Payer: Medicare Other | Admitting: Internal Medicine

## 2015-06-30 ENCOUNTER — Encounter (HOSPITAL_COMMUNITY): Payer: Medicare Other

## 2015-07-02 ENCOUNTER — Encounter (HOSPITAL_COMMUNITY)
Admission: RE | Admit: 2015-07-02 | Discharge: 2015-07-02 | Disposition: A | Discharge status: Home / Self Care | Payer: Medicare Other | Source: Ambulatory Visit | Attending: Internal Medicine | Admitting: Internal Medicine

## 2015-07-02 DIAGNOSIS — J438 Other emphysema: Secondary | ICD-10-CM | POA: Diagnosis not present

## 2015-07-02 NOTE — Progress Notes (Signed)
Today, Jill White exercised at [employer]. Service time was from 1030 to 1215.  The patient exercised by performing aerobic, strengthening, and stretching exercises. Oxygen saturation, heart rate, blood pressure, rate of perceived exertion, and shortness of breath were all monitored before, during, and after exercise. [name] presented with no problems at today's exercise session.  Patient attended the Stress Management class today.  The patient did have an increase in workload intensity during today's exercise session.  Pre-exercise vitals: . Weight kg: 53.1 . Liters of O2: 2 . SpO2: 96 . HR: 83 . BP: 100/56 . CBG: na  Exercise vitals: . Highest heartrate:  97 . Lowest oxygen saturation: 91 . Highest blood pressure: 132/70 . Liters of 02: 2  Post-exercise vitals: . SpO2: 94 . HR: 83 . BP: 120/70 . Liters of O2: 2 . CBG: na Dr. Rush Farmer, Medical Director Dr. Waldron Labs is immediately available during today's Pulmonary Rehab session for Jill White on 07/02/2015  at 1030 class time.  Marland Kitchen

## 2015-07-07 ENCOUNTER — Encounter (HOSPITAL_COMMUNITY)
Admission: RE | Admit: 2015-07-07 | Discharge: 2015-07-07 | Disposition: A | Discharge status: Home / Self Care | Payer: Medicare Other | Source: Ambulatory Visit | Attending: Internal Medicine | Admitting: Internal Medicine

## 2015-07-07 DIAGNOSIS — J438 Other emphysema: Secondary | ICD-10-CM | POA: Diagnosis not present

## 2015-07-07 NOTE — Progress Notes (Signed)
Today, Jill White exercised at [employer]. Service time was from 1030 to 1210.  The patient exercised by performing aerobic, strengthening, and stretching exercises. Oxygen saturation, heart rate, blood pressure, rate of perceived exertion, and shortness of breath were all monitored before, during, and after exercise. [name] presented with no problems at today's exercise session.  The patient did not have an increase in workload intensity during today's exercise session.  Pre-exercise vitals: . Weight kg: 52.3 . Liters of O2: 2 . SpO2: 94 . HR: 88 . BP: 134/66 . CBG: na  Exercise vitals: . Highest heartrate:  103 . Lowest oxygen saturation: 89 . Highest blood pressure: 110/70 . Liters of 02: 2  Post-exercise vitals: . SpO2: 92 . HR: 93 . BP: 110/56 . Liters of O2: 2 . CBG: NA Dr. Rush Farmer, Medical Director Dr. Marily Memos is immediately available during today's Pulmonary Rehab session for [name] on 07/07/2015  at 1030 class time  .

## 2015-07-09 ENCOUNTER — Other Ambulatory Visit: Payer: Self-pay | Admitting: *Deleted

## 2015-07-09 ENCOUNTER — Encounter (HOSPITAL_COMMUNITY)
Admission: RE | Admit: 2015-07-09 | Discharge: 2015-07-09 | Disposition: A | Discharge status: Home / Self Care | Payer: Medicare Other | Source: Ambulatory Visit | Attending: Internal Medicine | Admitting: Internal Medicine

## 2015-07-09 DIAGNOSIS — J438 Other emphysema: Secondary | ICD-10-CM | POA: Diagnosis not present

## 2015-07-09 MED ORDER — POTASSIUM CHLORIDE ER 10 MEQ PO TBCR: 10.0000 meq | EXTENDED_RELEASE_TABLET | Freq: Every day | ORAL | Status: DC

## 2015-07-09 NOTE — Progress Notes (Signed)
Today, Jill White exercised at [employer]. Service time was from 1030 to 1220.  The patient exercised by performing aerobic, strengthening, and stretching exercises. Oxygen saturation, heart rate, blood pressure, rate of perceived exertion, and shortness of breath were all monitored before, during, and after exercise. Jill White presented with no problems at today's exercise session. Jill White also attended an education session on pulmonary medications.  The patient did not have an increase in workload intensity during today's exercise session.  Pre-exercise vitals: . Weight kg: 52.1 . Liters of O2: 2L . SpO2: 95 . HR: 86 . BP: 116/52 . CBG: na  Exercise vitals: . Highest heartrate:  105 . Lowest oxygen saturation: 90 . Highest blood pressure: 150/62 . Liters of 02: 2L  Post-exercise vitals: . SpO2: 91 . HR: 89 . BP: 110/58 . Liters of O2: 2L . CBG: na  Dr. Rush Farmer, Medical Director Dr. Marily Memos is immediately available during today's Pulmonary Rehab session for [place] on 07/09/2015 at 1030 class time

## 2015-07-14 ENCOUNTER — Encounter (HOSPITAL_COMMUNITY)
Admission: RE | Admit: 2015-07-14 | Discharge: 2015-07-14 | Disposition: A | Discharge status: Home / Self Care | Payer: Medicare Other | Source: Ambulatory Visit | Attending: Internal Medicine | Admitting: Internal Medicine

## 2015-07-14 DIAGNOSIS — J438 Other emphysema: Secondary | ICD-10-CM | POA: Diagnosis not present

## 2015-07-14 NOTE — Progress Notes (Signed)
Today, [name] exercised at [employer]. Service time was from 1030 to 1200.  The patient exercised by performing aerobic, strengthening, and stretching exercises. Oxygen saturation, heart rate, blood pressure, rate of perceived exertion, and shortness of breath were all monitored before, during, and after exercise. Jill White presented with no problems at today's exercise session.  The patient did not have an increase in workload intensity during today's exercise session.  Pre-exercise vitals: . Weight kg: 52.2 . Liters of O2: 2 . SpO2: 0 . HR: 96 . BP: 130/60 . CBG: NA  Exercise vitals: . Highest heartrate:  115 . Lowest oxygen saturation: 87 . Highest blood pressure: 140/76 . Liters of 02: 2  Post-exercise vitals: . SpO2: 92 . HR: 96 . BP: 120/62 . Liters of O2: 2 . CBG: NA Dr. Rush Farmer, Medical Director [name] is immediately available during today's Pulmonary Rehab session for [place] on 07/14/2015  at 1030 class time  .

## 2015-07-16 ENCOUNTER — Encounter (HOSPITAL_COMMUNITY)
Admission: RE | Admit: 2015-07-16 | Discharge: 2015-07-16 | Disposition: A | Discharge status: Home / Self Care | Payer: Medicare Other | Source: Ambulatory Visit | Attending: Internal Medicine | Admitting: Internal Medicine

## 2015-07-16 DIAGNOSIS — J438 Other emphysema: Secondary | ICD-10-CM | POA: Diagnosis not present

## 2015-07-16 NOTE — Progress Notes (Signed)
Today, Candid exercised at [employer]. Service time was from 1030 to 1230.  The patient exercised by performing aerobic, strengthening, and stretching exercises. Oxygen saturation, heart rate, blood pressure, rate of perceived exertion, and shortness of breath were all monitored before, during, and after exercise. Sunny presented with no problems at today's exercise session. Patient attended the Oxygen Safety class today.  The patient did not have an increase in workload intensity during today's exercise session.  Pre-exercise vitals: . Weight kg: 52.4 . Liters of O2: 2 . SpO2: 93 . HR: 84 . BP: 102/60 . CBG: na  Exercise vitals: . Highest heartrate:  96 . Lowest oxygen saturation: 92 . Highest blood pressure: 122/68 . Liters of 02: 2  Post-exercise vitals: . SpO2: 89 . HR: 93 . BP: 124/60 . Liters of O2: 2 . CBG: na Dr. Rush Farmer, Medical Director Dr. Sheran Fava is immediately available during today's Pulmonary Rehab session for [place] on 07/16/2015  at 1030 class time.  Marland Kitchen

## 2015-07-21 ENCOUNTER — Encounter (HOSPITAL_COMMUNITY)
Admission: RE | Admit: 2015-07-21 | Discharge: 2015-07-21 | Disposition: A | Discharge status: Home / Self Care | Payer: Medicare Other | Source: Ambulatory Visit | Attending: Internal Medicine | Admitting: Internal Medicine

## 2015-07-21 DIAGNOSIS — J438 Other emphysema: Secondary | ICD-10-CM | POA: Diagnosis not present

## 2015-07-21 NOTE — Progress Notes (Signed)
Today, Jill White exercised at [employer]. Service time was from 1030 to 1200.  The patient exercised by performing aerobic, strengthening, and stretching exercises. Oxygen saturation, heart rate, blood pressure, rate of perceived exertion, and shortness of breath were all monitored before, during, and after exercise. Jill White presented with no problems at today's exercise session.  The patient did not have an increase in workload intensity during today's exercise session.  Pre-exercise vitals: . Weight kg: 51.8 . Liters of O2: 2L . SpO2: 92 . HR: 87 . BP: 134/70 . CBG: na  Exercise vitals: . Highest heartrate:  104 . Lowest oxygen saturation: 90 . Highest blood pressure: 138/72 . Liters of 02: 2L  Post-exercise vitals: . SpO2: 94 . HR: 90 . BP: 122/70 . Liters of O2: 2L . CBG: na  Dr. Rush Farmer, Medical Director Dr. Marily Memos is immediately available during today's Pulmonary Rehab session for [place] on 07/21/2015 at 1030 class time

## 2015-07-22 ENCOUNTER — Other Ambulatory Visit: Payer: Self-pay | Admitting: Internal Medicine

## 2015-07-23 ENCOUNTER — Encounter (HOSPITAL_COMMUNITY)
Admission: RE | Admit: 2015-07-23 | Discharge: 2015-07-23 | Disposition: A | Discharge status: Home / Self Care | Payer: Medicare Other | Source: Ambulatory Visit | Attending: Internal Medicine | Admitting: Internal Medicine

## 2015-07-23 DIAGNOSIS — J438 Other emphysema: Secondary | ICD-10-CM | POA: Insufficient documentation

## 2015-07-23 NOTE — Progress Notes (Signed)
Today, Jill White exercised at [employer]. Service time was from 1030 to 1220.  The patient exercised by performing aerobic, strengthening, and stretching exercises. Oxygen saturation, heart rate, blood pressure, rate of perceived exertion, and shortness of breath were all monitored before, during, and after exercise. Jill White presented with no problems at today's exercise session. Patient attended the Diaphragmatic , purse lip breathing and pulmonary meds class today.   The patient did not have an increase in workload intensity during today's exercise session.  Pre-exercise vitals: . Weight kg: 52.2 . Liters of O2: 2 . SpO2: 97 . HR: 93 . BP: 12470 . CBG: na  Exercise vitals: . Highest heartrate:  102 . Lowest oxygen saturation: 91 . Highest blood pressure: 132/68 . Liters of 02: 2  Post-exercise vitals: . SpO2: 93 . HR: 86 . BP: 118/62 . Liters of O2: 2 . CBG: na Dr. Rush White, Medical Director Dr. [name] is immediately available during today's Pulmonary Rehab session for Jill White on 07/23/2015  at 1030 class time.  Marland Kitchen

## 2015-07-28 ENCOUNTER — Encounter (HOSPITAL_COMMUNITY)
Admission: RE | Admit: 2015-07-28 | Discharge: 2015-07-28 | Disposition: A | Discharge status: Home / Self Care | Payer: Medicare Other | Source: Ambulatory Visit | Attending: Internal Medicine | Admitting: Internal Medicine

## 2015-07-28 DIAGNOSIS — J438 Other emphysema: Secondary | ICD-10-CM | POA: Diagnosis not present

## 2015-07-28 NOTE — Progress Notes (Signed)
Today, Yodit exercised at [employer]. Service time was from 1030 to 1200.  The patient exercised by performing aerobic, strengthening, and stretching exercises. Oxygen saturation, heart rate, blood pressure, rate of perceived exertion, and shortness of breath were all monitored before, during, and after exercise. Gaynor presented with no problems at today's exercise session.  The patient did not have an increase in workload intensity during today's exercise session.  Pre-exercise vitals: . Weight kg: 51.9 . Liters of O2: 295 . SpO2: 92 . HR: 104/70 . BP: NA . CBG: NA  Exercise vitals: . Highest heartrate:  102 . Lowest oxygen saturation: 94 . Highest blood pressure: 126/70 . Liters of 02: 2  Post-exercise vitals: . SpO2: 94 . HR: 89 . BP: 106/68 . Liters of O2: 2 . CBG: NA Dr. Rush Farmer, Medical Director Dr. Marily Memos is immediately available during today's Pulmonary Rehab session for [place] on 07/28/2015  at 1030 class time  .

## 2015-07-30 ENCOUNTER — Encounter (HOSPITAL_COMMUNITY)
Admission: RE | Admit: 2015-07-30 | Discharge: 2015-07-30 | Disposition: A | Discharge status: Home / Self Care | Payer: Medicare Other | Source: Ambulatory Visit | Attending: Internal Medicine | Admitting: Internal Medicine

## 2015-07-30 DIAGNOSIS — J438 Other emphysema: Secondary | ICD-10-CM | POA: Diagnosis not present

## 2015-07-30 NOTE — Progress Notes (Signed)
Today, Jill White exercised at [employer]. Service time was from 1030 to 1230.  The patient exercised by performing aerobic, strengthening, and stretching exercises. Oxygen saturation, heart rate, blood pressure, rate of perceived exertion, and shortness of breath were all monitored before, during, and after exercise. Jill White presented with no problems at today's exercise session. Jill White also attended an education session with [place] on oxygen equiptment.  The patient did not have an increase in workload intensity during today's exercise session.  Pre-exercise vitals: . Weight kg: 52.8 . Liters of O2: 2L . SpO2: 96 . HR: 88 . BP: 126/70 . CBG: na  Exercise vitals: . Highest heartrate:  112 . Lowest oxygen saturation: 92 . Highest blood pressure: 122/60 . Liters of 02: 2L  Post-exercise vitals: . SpO2: 91 . HR: 96 . BP: 106/58 . Liters of O2: 2L . CBG: na  Dr. Rush Farmer, Medical Director Dr. Waldron Labs is immediately available during today's Pulmonary Rehab session for [name] on 07/30/2015 at 1030 class time.

## 2015-08-04 ENCOUNTER — Ambulatory Visit (INDEPENDENT_AMBULATORY_CARE_PROVIDER_SITE_OTHER): Payer: Medicare Other | Admitting: Pulmonary Disease

## 2015-08-04 ENCOUNTER — Encounter (HOSPITAL_COMMUNITY): Payer: Medicare Other

## 2015-08-04 ENCOUNTER — Encounter: Payer: Self-pay | Admitting: Pulmonary Disease

## 2015-08-04 ENCOUNTER — Other Ambulatory Visit (INDEPENDENT_AMBULATORY_CARE_PROVIDER_SITE_OTHER): Payer: Medicare Other

## 2015-08-04 DIAGNOSIS — J441 Chronic obstructive pulmonary disease with (acute) exacerbation: Secondary | ICD-10-CM

## 2015-08-04 DIAGNOSIS — K269 Duodenal ulcer, unspecified as acute or chronic, without hemorrhage or perforation: Secondary | ICD-10-CM

## 2015-08-04 DIAGNOSIS — J9612 Chronic respiratory failure with hypercapnia: Secondary | ICD-10-CM

## 2015-08-04 LAB — COMPREHENSIVE METABOLIC PANEL
ALT: 15 U/L (ref 0–35)
AST: 20 U/L (ref 0–37)
Albumin: 4.1 g/dL (ref 3.5–5.2)
Alkaline Phosphatase: 71 U/L (ref 39–117)
BILIRUBIN TOTAL: 0.6 mg/dL (ref 0.2–1.2)
BUN: 12 mg/dL (ref 6–23)
CALCIUM: 9.6 mg/dL (ref 8.4–10.5)
CO2: 28 mEq/L (ref 19–32)
CREATININE: 0.52 mg/dL (ref 0.40–1.20)
Chloride: 94 mEq/L — ABNORMAL LOW (ref 96–112)
GFR: 121.62 mL/min (ref >60.00)
GLUCOSE: 97 mg/dL (ref 70–99)
Potassium: 3.4 mEq/L — ABNORMAL LOW (ref 3.5–5.1)
Sodium: 133 mEq/L — ABNORMAL LOW (ref 135–145)
Total Protein: 7.3 g/dL (ref 6.0–8.3)

## 2015-08-04 LAB — CBC WITH DIFFERENTIAL/PLATELET
BASOS ABS: 0 10*3/uL (ref 0.0–0.1)
Basophils Relative: 0.3 % (ref 0.0–3.0)
EOS ABS: 0.1 10*3/uL (ref 0.0–0.7)
Eosinophils Relative: 0.6 % (ref 0.0–5.0)
HEMATOCRIT: 46.7 % — AB (ref 36.0–46.0)
Hemoglobin: 15.7 g/dL — ABNORMAL HIGH (ref 12.0–15.0)
LYMPHS PCT: 22.2 % (ref 12.0–46.0)
Lymphs Abs: 2.1 10*3/uL (ref 0.7–4.0)
MCHC: 33.7 g/dL (ref 30.0–36.0)
MCV: 90.5 fl (ref 78.0–100.0)
Monocytes Absolute: 0.5 10*3/uL (ref 0.1–1.0)
Monocytes Relative: 5.8 % (ref 3.0–12.0)
NEUTROS ABS: 6.7 10*3/uL (ref 1.4–7.7)
Neutrophils Relative %: 71.1 % (ref 43.0–77.0)
PLATELETS: 319 10*3/uL (ref 150.0–400.0)
RBC: 5.16 Mil/uL — AB (ref 3.87–5.11)
RDW: 13.5 % (ref 11.5–15.5)
WBC: 9.4 10*3/uL (ref 4.0–10.5)

## 2015-08-04 MED ORDER — PANTOPRAZOLE SODIUM 40 MG PO TBEC: 40.0000 mg | DELAYED_RELEASE_TABLET | Freq: Every day | ORAL | Status: DC

## 2015-08-04 MED ORDER — METHYLPREDNISOLONE ACETATE 80 MG/ML IJ SUSP
80.0000 mg | Freq: Once | INTRAMUSCULAR | Status: AC
  Administered 2015-08-04: 80 mg via INTRAMUSCULAR

## 2015-08-04 NOTE — Assessment & Plan Note (Signed)
Check ONo on 1L [place] when stable

## 2015-08-04 NOTE — Patient Instructions (Signed)
Take stool softener or fiber Rx for protonix 40 daily x 2 weeks OK to stop mucinex x 1 week, then restart at 600 mg daily Take ipratropium nebs twice daily x  1 week (stop spiriva )- Ok to take albuterol nebs once daily Blood work today  Solumedrol 80 mg x 1   Call for antibiotic if green phlegm increases/ persists

## 2015-08-04 NOTE — Assessment & Plan Note (Addendum)
She seems to be having a COPD exacerbation but treatment is compounded by her preferences and several allergies. Not clear if this is infection related Tried to work around her several allergies - prednisone, antibiotic etc Take ipratropium nebs twice daily x  1 week (stop spiriva )- Ok to take albuterol nebs once daily   Solumedrol 80 mg x 1   Call for antibiotic if green phlegm increases/ persists

## 2015-08-04 NOTE — Progress Notes (Signed)
   Subjective:    Patient ID: Jill White, female    DOB: 05/15/1941, 75 y.o.   MRN: [id number]  HPI  75 year old female Gold stage II COPD, history of frequent exacerbations On 2l /m during sleep and as needed daytime MR pt  Chief Complaint  Patient presents with  . Acute Visit    chest tightness, coughing up yellow phlegm.. back pain.  Abnormal pulse rate.   Accompanied by granddaughter C/o nausea, no abd pain - feels related to taking high dose 1200 mucinex Last BM 2ds ago sudden onset dyspnea 2 ds ago, occ wheeze, green phlegm No URI or fever at onse or sick contacts Was in pulm rehab   Has several allergies which were reviewed Compliant with symbicort & spiriva Albuterol makes her heart race - on cardizem    Past Medical History  Diagnosis Date  . HIP PAIN   . HYPERTENSION   . Restless leg syndrome   . COPD (chronic obstructive pulmonary disease) (HCC)     a. Multiple prior admissions for acute hypoxic respiratory failure in setting of COPD exacerbations +/- pneumonia.  . Peptic ulcer disease     a. 2013: gastric antrum.  Marland Kitchen Hyponatremia   . Tobacco abuse   . Hypomagnesemia   . LBBB (left bundle branch block)     a. Intermittent, likely rate related.  . Pulmonary HTN ([place])     a. Elevated PA pressure by prior echoes.  . Mitral regurgitation     a. Mild MR by echo 2014, not mentioned on 11/2013 (severely calcified annulus).  Marland Kitchen Respiratory failure ([place])     a. Adm 11/2013 with severe hypercarbic and hypoxemic respiratory failure requiring intubation.   . Abnormal echocardiogram     a. 11/2013: decreased EF 45-50%, moderate AS- > subsequent cath 12/02/13 with mildly calcified cors without obstruction, mild AS, EF 60%, mild pulm HTN.  Marland Kitchen Aortic stenosis     a. 11/2013: mod by echo, then mild by cath.  . Pulmonary HTN ([place])     a. 11/2013: mild by cath.  . Wide-complex tachycardia ([place])     a. Felt likely atrial tach with rate-related LBBB.  Marland Kitchen Chronic diastolic CHF  (congestive heart failure) ([place])   . Diverticulitis of colon (without mention of hemorrhage) 08/09/2013  . Septic shock ([place]) 12/11/2013  . Acute on chronic combined systolic and diastolic congestive heart failure, NYHA class 4 ([place]) 11/27/2013    Review of Systems neg for any significant sore throat, dysphagia, itching, sneezing, nasal congestion or excess/ purulent secretions, fever, chills, sweats, unintended wt loss, pleuritic or exertional cp, hempoptysis, orthopnea pnd or change in chronic leg swelling.   Also denies presyncope, palpitations, heartburn, abdominal pain vomiting, diarrhea or change in bowel or urinary habits, dysuria,hematuria, rash, arthralgias, visual complaints, headache, numbness weakness or ataxia.     Objective:   Physical Exam  Gen. Pleasant, thin, in no distress ENT - no lesions, no post nasal drip Neck: No JVD, no thyromegaly, no carotid bruits Lungs: no use of accessory muscles, no dullness to percussion, clear without rales or rhonchi  Cardiovascular: Rhythm regular, heart sounds  normal, no murmurs or gallops, no peripheral edema Musculoskeletal: No deformities, no cyanosis or clubbing         Assessment & Plan:

## 2015-08-04 NOTE — Assessment & Plan Note (Signed)
Gastritis with constipation - she attributes to mucinex high dose  Take stool softener or fiber Rx for protonix 40 daily x 2 weeks OK to stop mucinex x 1 week, then restart at 600 mg daily Blood work today - LFTs

## 2015-08-06 ENCOUNTER — Encounter (HOSPITAL_COMMUNITY): Payer: Medicare Other

## 2015-08-11 ENCOUNTER — Encounter (HOSPITAL_COMMUNITY): Payer: Medicare Other

## 2015-08-13 ENCOUNTER — Telehealth: Payer: Self-pay | Admitting: Internal Medicine

## 2015-08-13 ENCOUNTER — Encounter (HOSPITAL_COMMUNITY): Payer: Medicare Other

## 2015-08-13 NOTE — Telephone Encounter (Signed)
New Message:  Pt called in wanting to speak with the nurse about getting her appt rescheduled to 2/24 because she was aware it was cancelled. I explained to her that her appt on 2/24 was reschedule to 1/9 to be seen sooner but due to inclement weather it was cancelled and was never rescheduled. I offered her Dr. Harrington Challenger' next available (3/3) she said that it unacceptable. I offered an appt with a PA and she did not want to see a PA. Pt says she is having some irregular heart beats and feels this is high priority. Follow up with pt

## 2015-08-13 NOTE — Telephone Encounter (Signed)
Pt coming tomorrow for appointment with Dr. Harrington Challenger.

## 2015-08-14 ENCOUNTER — Other Ambulatory Visit: Payer: Self-pay | Admitting: Internal Medicine

## 2015-08-14 ENCOUNTER — Encounter: Payer: Self-pay | Admitting: Internal Medicine

## 2015-08-14 ENCOUNTER — Ambulatory Visit (INDEPENDENT_AMBULATORY_CARE_PROVIDER_SITE_OTHER): Payer: Medicare Other | Admitting: Internal Medicine

## 2015-08-14 ENCOUNTER — Ambulatory Visit: Payer: Medicare Other | Admitting: Internal Medicine

## 2015-08-14 VITALS — BP 124/74 | HR 105 | Ht 63.0 in | Wt 113.0 lb

## 2015-08-14 DIAGNOSIS — R002 Palpitations: Secondary | ICD-10-CM

## 2015-08-14 MED ORDER — DILTIAZEM HCL 30 MG PO TABS: 30.0000 mg | ORAL_TABLET | Freq: Four times a day (QID) | ORAL | Status: DC

## 2015-08-14 NOTE — Progress Notes (Signed)
Cardiology Office Note   Date:  08/14/2015   ID:  Jill White, DOB 12-07-1940, MRN [id number]  PCP:  Laurey Morale, MD  Cardiologist:   Dorris Carnes, MD   F/U of diastolic CHF and AS    History of Present Illness: Jill White is a 75 y.o. female with a history of  severe COPD, An echo in past that showed mild LV dysfunction and moderate to severe AS Underwent R and L heart cath that showed calcified coronary arteries but no signif CAD Mild AS with a mean gradient of 7 mm Hg. Milldy elevated R sided pressures. LVEF normal at 60^   I saw the pt in April 2016   Seen by [place] on 2/14  Complained of chest tightness, phlegm  Abnormal pulse   Pt says heart rate has been racing  ON 120 CD daughter says she felt sluggish  Backed down to 30 bid  Now HR is racing     Outpatient Prescriptions Prior to Visit  Medication Sig Dispense Refill  . aspirin 81 MG tablet Take 81 mg by mouth every morning. Patient takes every other day    . clonazePAM (KLONOPIN) 1 MG tablet TAKE 1 TABLET BY MOUTH NIGHTLY AT BEDTIME AS NEEDED 90 tablet 1  . diltiazem (CARDIZEM) 30 MG tablet Take 1 tablet (30 mg total) by mouth 2 (two) times daily. 60 tablet 11  . furosemide (LASIX) 20 MG tablet TAKE 1.5 TABLETS (30 MG TOTAL) BY MOUTH DAILY. 45 tablet 3  . guaiFENesin (MUCINEX) 600 MG 12 hr tablet Take 600 mg by mouth 2 (two) times daily.    . magnesium oxide (MAG-OX) 400 MG tablet Take 1 tablet (400 mg total) by mouth daily. 30 tablet 6  . ondansetron (ZOFRAN) 4 MG tablet Take 4 mg by mouth every 4 (four) hours as needed for nausea or vomiting.    . pantoprazole (PROTONIX) 40 MG tablet Take 1 tablet (40 mg total) by mouth daily. X 2 wks 14 tablet 0  . potassium chloride (K-DUR) 10 MEQ tablet Take 1 tablet (10 mEq total) by mouth daily. 30 tablet 1  . PROAIR HFA 108 (90 BASE) MCG/ACT inhaler INHALE TWO PUFFS BY MOUTH EVERY FOUR HOURS AS NEEDED (Patient taking differently: INHALE TWO PUFFS BY MOUTH EVERY FOUR  HOURS AS NEEDED shortness of breath/wheezing) 8.5 Inhaler 3  . Respiratory Therapy Supplies (FLUTTER) DEVI Use as directed 1 each 0  . SYMBICORT 80-4.5 MCG/ACT inhaler INHAKLE 2 PUFFS INTO THE LUNGS TWICE DAILY 10.2 g 3  . tiotropium (SPIRIVA) 18 MCG inhalation capsule Place 1 capsule (18 mcg total) into inhaler and inhale daily. 30 capsule 12   No facility-administered medications prior to visit.     Allergies:   Tetanus toxoid; Codeine; Erythromycin; Gabapentin; Levaquin; Meloxicam; Methylprednisolone; Penicillins; and Prednisone   Past Medical History  Diagnosis Date  . HIP PAIN   . HYPERTENSION   . Restless leg syndrome   . COPD (chronic obstructive pulmonary disease) (HCC)     a. Multiple prior admissions for acute hypoxic respiratory failure in setting of COPD exacerbations +/- pneumonia.  . Peptic ulcer disease     a. 2013: gastric antrum.  Marland Kitchen Hyponatremia   . Tobacco abuse   . Hypomagnesemia   . LBBB (left bundle branch block)     a. Intermittent, likely rate related.  . Pulmonary HTN ([place])     a. Elevated PA pressure by prior echoes.  . Mitral regurgitation  a. Mild MR by echo 2014, not mentioned on 11/2013 (severely calcified annulus).  Marland Kitchen Respiratory failure ([place])     a. Adm 11/2013 with severe hypercarbic and hypoxemic respiratory failure requiring intubation.   . Abnormal echocardiogram     a. 11/2013: decreased EF 45-50%, moderate AS- > subsequent cath 12/02/13 with mildly calcified cors without obstruction, mild AS, EF 60%, mild pulm HTN.  Marland Kitchen Aortic stenosis     a. 11/2013: mod by echo, then mild by cath.  . Pulmonary HTN ([place])     a. 11/2013: mild by cath.  . Wide-complex tachycardia ([place])     a. Felt likely atrial tach with rate-related LBBB.  Marland Kitchen Chronic diastolic CHF (congestive heart failure) ([place])   . Diverticulitis of colon (without mention of hemorrhage) 08/09/2013  . Septic shock ([place]) 12/11/2013  . Acute on chronic combined systolic and diastolic congestive  heart failure, NYHA class 4 ([place]) 11/27/2013    Past Surgical History  Procedure Laterality Date  . Abdominal hysterectomy      vaginal hysterectomy  . Dilation and curettage of uterus    . Tonsillectomy    . Esophagogastroduodenoscopy  07/09/2011    Procedure: ESOPHAGOGASTRODUODENOSCOPY (EGD);  Surgeon: Missy Sabins, MD;  Location: [facility];  Service: Endoscopy;  Laterality: N/A;  patient in room 1532  . Cataract extraction w/ intraocular lens  implant, bilateral Bilateral 03/2013    Patient claims it was within the month.   . Left and right heart catheterization with coronary angiogram N/A 12/02/2013    Procedure: LEFT AND RIGHT HEART CATHETERIZATION WITH CORONARY ANGIOGRAM;  Surgeon: Blane Ohara, MD;  Location[facility];  Service: Cardiovascular;  Laterality: N/A;  . Laparoscopic sigmoid colectomy  07-03-14    per Dr. Malachi Carl in [place] History:  The patient  reports that she has been smoking Cigarettes.  She has smoked for the past 50 years. She has never used smokeless tobacco. She reports that she does not drink alcohol or use illicit drugs.   Family History:  The patient's family history includes Anemia in her father; Dementia in her mother; Heart disease in her brother and sister.    ROS:  Please see the history of present illness. All other systems are reviewed and  Negative to the above problem except as noted.    PHYSICAL EXAM: VS:  BP 124/74 mmHg  Pulse 105  Ht 5\' 3"  (1.6 m)  Wt 113 lb (51.256 kg)  BMI 20.02 kg/m2  GEN: Well nourished, well developed, in no acute distress HEENT: normal Neck: no JVD, carotid bruits, or masses Cardiac: RRR; Gr III/VI systlolc murmur  No rubs, or gallops,no edema  Respiratory:  Decreased airflow  No wheezes or rales  No rhonchi GI: soft, nontender, nondistended, + BS  No hepatomegaly  MS: no deformity Moving all extremities   Skin: warm and dry, no rash Neuro:  Strength and sensation are  intact Psych: euthymic mood, full affect   EKG:  EKG is ordered today.St with bursts of PAT  105 bpm  LBBB   Lipid Panel    Component Value Date/Time   CHOL 166 11/04/2010 0807   TRIG 129 11/28/2013 1600   HDL 71.70 11/04/2010 0807   CHOLHDL 2 11/04/2010 0807   VLDL 21.2 11/04/2010 0807   LDLCALC 73 11/04/2010 0807      Wt Readings from Last 3 Encounters:  08/14/15 113 lb (51.256 kg)  08/04/15 111 lb (50.349 kg)  06/23/15 115 lb (52.164 kg)      ASSESSMENT AND PLAN:  1  Chronic diastolic CHF  Volume status appears OK   2.  Tachycardia  Pt is now on dlt 30 bid HR is not controlled  Daughter said she was very sluggish on 120 CD I would increase to 30 qid  F/U with holter in 2 wks for HR analysis as well as rhythm  (? If having afib  0  2.  Aortic stenosis  Last echo in Aug 2016  Moderate AS Will need repeat sometime this summer   3.  COPD  Severe  Still smoking  Counselled on cessatoin   Signed, Dorris Carnes, MD  08/14/2015 10:13 AM    [address], [place],   [address] Phone: [phone]; Fax: [phone]

## 2015-08-14 NOTE — Patient Instructions (Signed)
Your physician has recommended you make the following change in your medication:  1.) increase your diltiazem to 30 mg four times daily (approx every 6 hrs)  Your physician has recommended that you wear a holter monitor. Holter monitors are medical devices that record the heart's electrical activity. Doctors most often use these monitors to diagnose arrhythmias. Arrhythmias are problems with the speed or rhythm of the heartbeat. The monitor is a small, portable device. You can wear one while you do your normal daily activities. This is usually used to diagnose what is causing palpitations/syncope (passing out).

## 2015-08-15 ENCOUNTER — Other Ambulatory Visit: Payer: Self-pay | Admitting: Pulmonary Disease

## 2015-08-17 ENCOUNTER — Encounter: Payer: Self-pay | Admitting: Internal Medicine

## 2015-08-17 ENCOUNTER — Encounter: Payer: Self-pay | Admitting: Pulmonary Disease

## 2015-08-17 MED ORDER — AZITHROMYCIN 250 MG PO TABS: 250.0000 mg | ORAL_TABLET | ORAL | Status: DC

## 2015-08-17 NOTE — Telephone Encounter (Signed)
Patient Returned call [phone] or call [phone]

## 2015-08-17 NOTE — Telephone Encounter (Signed)
Pt aware rx was sent Nothing further needed

## 2015-08-17 NOTE — Telephone Encounter (Signed)
LMTCB for the pt to get more details  Last AVS note states:  Take stool softener or fiber Rx for protonix 40 daily x 2 weeks OK to stop mucinex x 1 week, then restart at 600 mg daily Take ipratropium nebs twice daily x 1 week (stop spiriva )- Ok to take albuterol nebs once daily Blood work today  Solumedrol 80 mg x 1   Call for antibiotic if green phlegm increases/ persists   I spoke with RA- he states okay to go ahead and call in [place]   I have sent rx and emailed pt to inform her  I advised her to call for appointment if not improving

## 2015-08-18 ENCOUNTER — Encounter (HOSPITAL_COMMUNITY): Payer: Medicare Other

## 2015-08-20 ENCOUNTER — Encounter (HOSPITAL_COMMUNITY): Payer: Medicare Other

## 2015-08-20 NOTE — Telephone Encounter (Signed)
Patient thought that taking the diltiazem 4 x per day was slowing her heart rate too much, but since then she has been spot checking and feels that her HR is actually fine.  States that her ankles were swollen after the first day but that has resolved.     Advised to call back with any further questions/concerns.  Pt verbalizes understanding and appreciation for the phone call.

## 2015-08-24 ENCOUNTER — Telehealth: Payer: Self-pay | Admitting: *Deleted

## 2015-08-24 NOTE — Telephone Encounter (Signed)
Initiated PA for Symbicort thru [facility]. Key: [id number] Submitted for review

## 2015-08-25 ENCOUNTER — Telehealth: Payer: Self-pay | Admitting: Internal Medicine

## 2015-08-25 MED ORDER — DILTIAZEM HCL ER COATED BEADS 120 MG PO CP24: 120.0000 mg | ORAL_CAPSULE | Freq: Every day | ORAL | Status: DC

## 2015-08-25 NOTE — Telephone Encounter (Signed)
Pt states that since starting Diltiazem 30mg  QID she has noticed swelling in bilateral feet. Swelling goes up and down. Pt has been taking Lasix 40mg  QD and it has helped with swelling. Pt is only suppose to be taking 30mg  QD. Pt started on this regimen of Diltiazem 2/24 and states swelling started on 2/26.  Swelling is tight and uncomfortable. On Dilt 30 QID HR has been 60-80. Pt did not take her night dose of Diltiazem yesterday and states her HR today is 90-100. Pt asked if maybe she could go back on Cardizem CD 120mg  QD and take it at night instead of in the morning since it made her sluggish? Advised pt that I will send this message to Dr. Harrington Challenger for review and advisement. Pt verbalized understanding and was appreciative for return call.

## 2015-08-25 NOTE — Telephone Encounter (Signed)
Informed pt that Dr.Ross said ok to go back to Cardizem CD 120 QD. Pt verbalized understanding and was in agreement with this plan.

## 2015-08-25 NOTE — Telephone Encounter (Signed)
Received fax from insurance stating Symbicort is a covered medication. Contacted pharmacy. Nothing further needed.

## 2015-08-25 NOTE — Telephone Encounter (Signed)
Yes  OK to go back on 120 CD Cardiazem

## 2015-08-25 NOTE — Telephone Encounter (Signed)
New message     Pt c/o swelling: STAT is pt has developed SOB within 24 hours  1. How long have you been experiencing swelling? Since cardizem dosage changed 2. Where is the swelling located?  Feet only 3.  Are you currently taking a "fluid pill"? Yes---furosemide 40mg  daily yesterday and today 4.  Are you currently SOB? Not any more than usual 5.  Have you traveled recently? no

## 2015-08-28 ENCOUNTER — Ambulatory Visit (INDEPENDENT_AMBULATORY_CARE_PROVIDER_SITE_OTHER): Payer: Medicare Other

## 2015-08-28 DIAGNOSIS — R002 Palpitations: Secondary | ICD-10-CM | POA: Diagnosis not present

## 2015-09-01 ENCOUNTER — Telehealth: Payer: Self-pay | Admitting: Internal Medicine

## 2015-09-01 NOTE — Telephone Encounter (Signed)
Call r/t to pt monitor. Pt had multiple atial runs lasting as long as 13 seconds. Pt called in today no complaints. Pt report sent to MD box to review.

## 2015-09-01 NOTE — Telephone Encounter (Signed)
NEW MESSAGE   PT PT CALLING TO SPEAK TO RN ABOUT WORKING HER MOTHER IN SCHEDULE

## 2015-09-01 NOTE — Telephone Encounter (Signed)
Returned call to patient's daughter Jill White.She stated she wanted to ask Dr.Ross if ok for mother to have a cortisone injection in right shoulder.Also mother increased lasix to 40 mg daily for a few days due to swelling in right foot.Stated she continues to have swelling. Weight stable.No sob.Stated she did not have any swelling until started on cardizem 120 mg daily. Mother returned monitor yesterday 08/31/15.Advised Dr.Ross out of office.Message sent to Dr.Ross for advice.

## 2015-09-03 ENCOUNTER — Encounter (HOSPITAL_COMMUNITY): Payer: Self-pay

## 2015-09-03 ENCOUNTER — Other Ambulatory Visit (INDEPENDENT_AMBULATORY_CARE_PROVIDER_SITE_OTHER): Payer: Medicare Other

## 2015-09-03 ENCOUNTER — Encounter: Payer: Self-pay | Admitting: Family Medicine

## 2015-09-03 ENCOUNTER — Ambulatory Visit (INDEPENDENT_AMBULATORY_CARE_PROVIDER_SITE_OTHER): Payer: Medicare Other | Admitting: Family Medicine

## 2015-09-03 VITALS — BP 126/72 | HR 91 | Ht 63.0 in | Wt 114.0 lb

## 2015-09-03 DIAGNOSIS — M129 Arthropathy, unspecified: Secondary | ICD-10-CM

## 2015-09-03 DIAGNOSIS — M25511 Pain in right shoulder: Secondary | ICD-10-CM

## 2015-09-03 DIAGNOSIS — M19011 Primary osteoarthritis, right shoulder: Secondary | ICD-10-CM | POA: Insufficient documentation

## 2015-09-03 NOTE — Progress Notes (Signed)
Pre visit review using our clinic review tool, if applicable. No additional management support is needed unless otherwise documented below in the visit note. 

## 2015-09-03 NOTE — Progress Notes (Signed)
[facility] [address] [place], [place] [address] Phone: [phone] Subjective:    I'm seeing this patient by the request  of:  FRY,STEPHEN A, MD   CC: Right shoulder pain  [id number] ROSMERI White is a 75 y.o. female coming in with complaint of right shoulder pain. Patient states that she has had this pain multiple years ago that did respond well to an injection at an outside facility. Patient states that she has more of a migratory polyarthralgia and unfortunately this feels like a recurrent episode in her shoulder. Patient states it is causing such severe amount of pain that she is unable to lift anything heavier than her coffee cup. Denies any radiation. States that she does not have weakness but is not moving the shoulder secondary to the discomfort. Patient does not remember any new injury. Patient rates the severity pain is 8 out of 10. Does not wake her up at night but makes it very uncomfortable to follow sleep. Has not tried any home modalities at this time. Patient was in the process of doing therapy with pulmonary that has a lot of arm movement she states.     Past Medical History  Diagnosis Date  . HIP PAIN   . HYPERTENSION   . Restless leg syndrome   . COPD (chronic obstructive pulmonary disease) (HCC)     a. Multiple prior admissions for acute hypoxic respiratory failure in setting of COPD exacerbations +/- pneumonia.  . Peptic ulcer disease     a. 2013: gastric antrum.  Marland Kitchen Hyponatremia   . Tobacco abuse   . Hypomagnesemia   . LBBB (left bundle branch block)     a. Intermittent, likely rate related.  . Pulmonary HTN ([place])     a. Elevated PA pressure by prior echoes.  . Mitral regurgitation     a. Mild MR by echo 2014, not mentioned on 11/2013 (severely calcified annulus).  Marland Kitchen Respiratory failure ([place])     a. Adm 11/2013 with severe hypercarbic and hypoxemic respiratory failure requiring intubation.   . Abnormal echocardiogram    a. 11/2013: decreased EF 45-50%, moderate AS- > subsequent cath 12/02/13 with mildly calcified cors without obstruction, mild AS, EF 60%, mild pulm HTN.  Marland Kitchen Aortic stenosis     a. 11/2013: mod by echo, then mild by cath.  . Pulmonary HTN ([place])     a. 11/2013: mild by cath.  . Wide-complex tachycardia ([place])     a. Felt likely atrial tach with rate-related LBBB.  Marland Kitchen Chronic diastolic CHF (congestive heart failure) ([place])   . Diverticulitis of colon (without mention of hemorrhage) 08/09/2013  . Septic shock ([place]) 12/11/2013  . Acute on chronic combined systolic and diastolic congestive heart failure, NYHA class 4 ([place]) 11/27/2013   Past Surgical History  Procedure Laterality Date  . Abdominal hysterectomy      vaginal hysterectomy  . Dilation and curettage of uterus    . Tonsillectomy    . Esophagogastroduodenoscopy  07/09/2011    Procedure: ESOPHAGOGASTRODUODENOSCOPY (EGD);  Surgeon: Missy Sabins, MD;  Location: [facility];  Service: Endoscopy;  Laterality: N/A;  patient in room 1532  . Cataract extraction w/ intraocular lens  implant, bilateral Bilateral 03/2013    Patient claims it was within the month.   . Left and right heart catheterization with coronary angiogram N/A 12/02/2013    Procedure: LEFT AND RIGHT HEART CATHETERIZATION WITH CORONARY ANGIOGRAM;  Surgeon: Blane Ohara, MD;  Location: [facility]  CATH LAB;  Service: Cardiovascular;  Laterality: N/A;  . Laparoscopic sigmoid colectomy  07-03-14    per Dr. Malachi Carl in [place] History  . Marital Status: Widowed    Spouse Name: N/A  . Number of Children: N/A  . Years of Education: N/A   Social History Main Topics  . Smoking status: Current Every Day Smoker -- 50 years    Types: Cigarettes  . Smokeless tobacco: Never Used     Comment: 1/2 pack per day  . Alcohol Use: No  . Drug Use: No  . Sexual Activity: No   Other Topics Concern  . None   Social History Narrative   Allergies  Allergen  Reactions  . Tetanus Toxoid Anaphylaxis  . Codeine Nausea And Vomiting and Other (See Comments)    Itching and faint   . Erythromycin Nausea And Vomiting  . Gabapentin     REACTION: severe edema, nausea, dizziness, disorientation  . Levaquin [Levofloxacin In D5w] Other (See Comments)    Joint pain; (states she can take it IV- not by mouth)  . Meloxicam Itching  . Methylprednisolone Hives  . Penicillins Hives  . Prednisone Other (See Comments)    GI bleed   Family History  Problem Relation Age of Onset  . Heart disease Sister   . Heart disease Brother   . Dementia Mother   . Anemia Father     Past medical history, social, surgical and family history all reviewed in electronic medical record.  No pertanent information unless stated regarding to the chief complaint.   Review of Systems: No headache, visual changes, nausea, vomiting, diarrhea, constipation, dizziness, abdominal pain, skin rash, fevers, chills, night sweats, weight loss, swollen lymph nodes, body aches, joint swelling, muscle aches, chest pain, shortness of breath, mood changes.   Objective Blood pressure 126/72, pulse 91, height 5\' 3"  (1.6 m), weight 114 lb (51.71 kg), SpO2 90 %.  General: No apparent distress alert and oriented x3 mood and affect normal, dressed appropriately.  HEENT: Pupils equal, extraocular movements intact  Respiratory: Patient's speak in full sentences and does not appear short of breath  Cardiovascular: No lower extremity edema, non tender, no erythema  Skin: Warm dry intact with no signs of infection or rash on extremities or on axial skeleton.  Abdomen: Soft nontender  Neuro: Cranial nerves II through XII are intact, neurovascularly intact in all extremities with 2+ DTRs and 2+ pulses.  Lymph: No lymphadenopathy of posterior or anterior cervical chain or axillae bilaterally.  Gait normal with good balance and coordination.  MSK:  Non tender with full range of motion and good stability and  symmetric strength and tone of  elbows, wrist, hip, knee and ankles bilaterally. Arthritic changes of multiple joints  Shoulder: Right Mild atrophy of the shoulder girdle muscle strength Diffuse tenderness to palpation Mild limitation in external rotation and forward flexion of 5 Rotator cuff strength 4 out of 5 compared to 5 out of 5 on the contralateral side signs of impingement with positive Neer and Hawkin's tests, but negative empty can sign. Speeds and Yergason's tests normal. Positive crossover Normal scapular function observed. No painful arc and no drop arm sign. No apprehension sign  MSK US performed of: Right This study was ordered, performed, and interpreted by Charlann Boxer D.O.  Shoulder:   Supraspinatus:  Degenerative changes noted but no true acute tear. Patient does have bursal bulge. Underlying arthritic changes noted Infraspinatus:  Appears normal  on long and transverse views. Significant increase in Doppler flow Subscapularis:  Degenerative changes noted. Positive bursa Teres Minor:  Appears normal on long and transverse views. AC joint:  Moderate arthritis Glenohumeral Joint:  Moderate arthritis. Glenoid Labrum:  Intact without visualized tears. Biceps Tendon:  Appears normal on long and transverse views, no fraying of tendon, tendon located in intertubercular groove, no subluxation with shoulder internal or external rotation.  Impression: Subacromial bursitis, moderate to severe underlying arthritic changes.  Procedure: Real-time Ultrasound Guided Injection of right glenohumeral joint Device: GE Logiq E  Ultrasound guided injection is preferred based studies that show increased duration, increased effect, greater accuracy, decreased procedural pain, increased response rate with ultrasound guided versus blind injection.  Verbal informed consent obtained.  Time-out conducted.  Noted no overlying erythema, induration, or other signs of local infection.  Skin  prepped in a sterile fashion.  Local anesthesia: Topical Ethyl chloride.  With sterile technique and under real time ultrasound guidance:  Joint visualized.  23g 1  inch needle inserted posterior approach. Pictures taken for needle placement. Patient did have injection of 2 cc of 1% lidocaine, 2 cc of 0.5% Marcaine, and 1.0 cc of Kenalog 40 mg/dL. Completed without difficulty  Pain immediately resolved suggesting accurate placement of the medication.  Advised to call if fevers/chills, erythema, induration, drainage, or persistent bleeding.  Images permanently stored and available for review in the ultrasound unit.  Impression: Technically successful ultrasound guided injection.      Impression and Recommendations:     This case required medical decision making of moderate complexity.      Note: This dictation was prepared with Dragon dictation along with smaller phrase technology. Any transcriptional errors that result from this process are unintentional.

## 2015-09-03 NOTE — Patient Instructions (Signed)
Good to see you.  Ice 20 minutes 2 times daily. Usually after activity and before bed. Exercises 3 times a week.  pennsaid pinkie amount topically 2 times daily as needed.  Keep hands within peripheral vision  Vitamin D is key at least 2000 IU daily  See me again in 4 weeks

## 2015-09-03 NOTE — Progress Notes (Signed)
Pulmonary Rehab Discharge Note: Jill White has been discharged from pulmonary rehab prior to completing the program. She completed 22 exercise/education session and was planning to complete her graduation 6 min walk test at the next visit. However she developed heart palpitations and lower extremity edema that required close observation by her cardiologist. She decided that she would drop out of pulmonary rehab with the anticipation of re-inrolling at a later time once her cardiac issues are resolved. She did not meet her personal goal of stopping her weight loss. She began the program weighing 54.1 kg and at her last appointment she weighed 52.8. She did however states that she gained a lot of stamina and strength while in the program and had a significant improvement in her exertional shortness of breath. It was a pleasure having Jill White in our program.

## 2015-09-03 NOTE — Assessment & Plan Note (Signed)
Patient given injection today. We discussed icing regimen and home exercises. We discussed which activities to do in which ones to potentially avoid. Patient given topical anti-inflammatories. Will avoid any oral anti-inflammatories secondary to her comorbidities. Patient will come back and see me again in 4 weeks.  Spent  25 minutes with patient face-to-face and had greater than 50% of counseling including as described above in assessment and plan.

## 2015-09-13 ENCOUNTER — Encounter (HOSPITAL_COMMUNITY): Payer: Self-pay | Admitting: Emergency Medicine

## 2015-09-13 ENCOUNTER — Emergency Department (HOSPITAL_COMMUNITY): Payer: Medicare Other

## 2015-09-13 ENCOUNTER — Inpatient Hospital Stay (HOSPITAL_COMMUNITY)
Admission: EM | Admit: 2015-09-13 | Discharge: 2015-09-28 | DRG: 190 | Disposition: A | Discharge status: Home Health | Payer: Medicare Other | Attending: Internal Medicine | Admitting: Internal Medicine

## 2015-09-13 ENCOUNTER — Inpatient Hospital Stay (HOSPITAL_COMMUNITY): Payer: Medicare Other

## 2015-09-13 DIAGNOSIS — J9690 Respiratory failure, unspecified, unspecified whether with hypoxia or hypercapnia: Secondary | ICD-10-CM

## 2015-09-13 DIAGNOSIS — J189 Pneumonia, unspecified organism: Secondary | ICD-10-CM

## 2015-09-13 DIAGNOSIS — G9340 Encephalopathy, unspecified: Secondary | ICD-10-CM | POA: Insufficient documentation

## 2015-09-13 DIAGNOSIS — Y95 Nosocomial condition: Secondary | ICD-10-CM | POA: Diagnosis present

## 2015-09-13 DIAGNOSIS — J440 Chronic obstructive pulmonary disease with acute lower respiratory infection: Secondary | ICD-10-CM | POA: Diagnosis not present

## 2015-09-13 DIAGNOSIS — J441 Chronic obstructive pulmonary disease with (acute) exacerbation: Secondary | ICD-10-CM | POA: Diagnosis not present

## 2015-09-13 DIAGNOSIS — I5032 Chronic diastolic (congestive) heart failure: Secondary | ICD-10-CM | POA: Insufficient documentation

## 2015-09-13 DIAGNOSIS — J9620 Acute and chronic respiratory failure, unspecified whether with hypoxia or hypercapnia: Secondary | ICD-10-CM | POA: Diagnosis not present

## 2015-09-13 DIAGNOSIS — R1310 Dysphagia, unspecified: Secondary | ICD-10-CM | POA: Diagnosis present

## 2015-09-13 DIAGNOSIS — F410 Panic disorder [episodic paroxysmal anxiety] without agoraphobia: Secondary | ICD-10-CM | POA: Diagnosis present

## 2015-09-13 DIAGNOSIS — Z8249 Family history of ischemic heart disease and other diseases of the circulatory system: Secondary | ICD-10-CM

## 2015-09-13 DIAGNOSIS — M546 Pain in thoracic spine: Secondary | ICD-10-CM | POA: Diagnosis not present

## 2015-09-13 DIAGNOSIS — F17210 Nicotine dependence, cigarettes, uncomplicated: Secondary | ICD-10-CM | POA: Diagnosis present

## 2015-09-13 DIAGNOSIS — I441 Atrioventricular block, second degree: Secondary | ICD-10-CM | POA: Diagnosis present

## 2015-09-13 DIAGNOSIS — M549 Dorsalgia, unspecified: Secondary | ICD-10-CM

## 2015-09-13 DIAGNOSIS — I272 Other secondary pulmonary hypertension: Secondary | ICD-10-CM | POA: Diagnosis present

## 2015-09-13 DIAGNOSIS — I5042 Chronic combined systolic (congestive) and diastolic (congestive) heart failure: Secondary | ICD-10-CM | POA: Diagnosis present

## 2015-09-13 DIAGNOSIS — Z7982 Long term (current) use of aspirin: Secondary | ICD-10-CM | POA: Diagnosis not present

## 2015-09-13 DIAGNOSIS — G4089 Other seizures: Secondary | ICD-10-CM | POA: Diagnosis not present

## 2015-09-13 DIAGNOSIS — E871 Hypo-osmolality and hyponatremia: Secondary | ICD-10-CM | POA: Diagnosis present

## 2015-09-13 DIAGNOSIS — I248 Other forms of acute ischemic heart disease: Secondary | ICD-10-CM | POA: Diagnosis present

## 2015-09-13 DIAGNOSIS — G2581 Restless legs syndrome: Secondary | ICD-10-CM | POA: Diagnosis present

## 2015-09-13 DIAGNOSIS — Z8711 Personal history of peptic ulcer disease: Secondary | ICD-10-CM

## 2015-09-13 DIAGNOSIS — E874 Mixed disorder of acid-base balance: Secondary | ICD-10-CM | POA: Diagnosis present

## 2015-09-13 DIAGNOSIS — I5030 Unspecified diastolic (congestive) heart failure: Secondary | ICD-10-CM

## 2015-09-13 DIAGNOSIS — E876 Hypokalemia: Secondary | ICD-10-CM | POA: Diagnosis present

## 2015-09-13 DIAGNOSIS — I5043 Acute on chronic combined systolic (congestive) and diastolic (congestive) heart failure: Secondary | ICD-10-CM | POA: Diagnosis not present

## 2015-09-13 DIAGNOSIS — R410 Disorientation, unspecified: Secondary | ICD-10-CM | POA: Insufficient documentation

## 2015-09-13 DIAGNOSIS — T40605A Adverse effect of unspecified narcotics, initial encounter: Secondary | ICD-10-CM | POA: Diagnosis present

## 2015-09-13 DIAGNOSIS — E43 Unspecified severe protein-calorie malnutrition: Secondary | ICD-10-CM | POA: Insufficient documentation

## 2015-09-13 DIAGNOSIS — I080 Rheumatic disorders of both mitral and aortic valves: Secondary | ICD-10-CM | POA: Diagnosis present

## 2015-09-13 DIAGNOSIS — I110 Hypertensive heart disease with heart failure: Secondary | ICD-10-CM | POA: Diagnosis present

## 2015-09-13 DIAGNOSIS — Z9981 Dependence on supplemental oxygen: Secondary | ICD-10-CM

## 2015-09-13 DIAGNOSIS — R0600 Dyspnea, unspecified: Secondary | ICD-10-CM | POA: Diagnosis not present

## 2015-09-13 DIAGNOSIS — Z9841 Cataract extraction status, right eye: Secondary | ICD-10-CM | POA: Diagnosis not present

## 2015-09-13 DIAGNOSIS — T148 Other injury of unspecified body region: Secondary | ICD-10-CM | POA: Diagnosis not present

## 2015-09-13 DIAGNOSIS — Z720 Tobacco use: Secondary | ICD-10-CM | POA: Diagnosis not present

## 2015-09-13 DIAGNOSIS — Z515 Encounter for palliative care: Secondary | ICD-10-CM | POA: Insufficient documentation

## 2015-09-13 DIAGNOSIS — Z7951 Long term (current) use of inhaled steroids: Secondary | ICD-10-CM

## 2015-09-13 DIAGNOSIS — I471 Supraventricular tachycardia: Secondary | ICD-10-CM | POA: Diagnosis present

## 2015-09-13 DIAGNOSIS — J438 Other emphysema: Secondary | ICD-10-CM | POA: Diagnosis not present

## 2015-09-13 DIAGNOSIS — Z9842 Cataract extraction status, left eye: Secondary | ICD-10-CM

## 2015-09-13 DIAGNOSIS — I447 Left bundle-branch block, unspecified: Secondary | ICD-10-CM | POA: Diagnosis present

## 2015-09-13 DIAGNOSIS — Z887 Allergy status to serum and vaccine status: Secondary | ICD-10-CM

## 2015-09-13 DIAGNOSIS — J9621 Acute and chronic respiratory failure with hypoxia: Secondary | ICD-10-CM | POA: Diagnosis not present

## 2015-09-13 DIAGNOSIS — Z681 Body mass index (BMI) 19 or less, adult: Secondary | ICD-10-CM

## 2015-09-13 DIAGNOSIS — M4854XA Collapsed vertebra, not elsewhere classified, thoracic region, initial encounter for fracture: Secondary | ICD-10-CM | POA: Diagnosis present

## 2015-09-13 DIAGNOSIS — R0602 Shortness of breath: Secondary | ICD-10-CM | POA: Insufficient documentation

## 2015-09-13 DIAGNOSIS — Z880 Allergy status to penicillin: Secondary | ICD-10-CM

## 2015-09-13 DIAGNOSIS — I4891 Unspecified atrial fibrillation: Secondary | ICD-10-CM | POA: Diagnosis present

## 2015-09-13 DIAGNOSIS — I5040 Unspecified combined systolic (congestive) and diastolic (congestive) heart failure: Secondary | ICD-10-CM | POA: Diagnosis present

## 2015-09-13 DIAGNOSIS — Z79899 Other long term (current) drug therapy: Secondary | ICD-10-CM

## 2015-09-13 DIAGNOSIS — Z961 Presence of intraocular lens: Secondary | ICD-10-CM | POA: Diagnosis present

## 2015-09-13 DIAGNOSIS — I350 Nonrheumatic aortic (valve) stenosis: Secondary | ICD-10-CM | POA: Insufficient documentation

## 2015-09-13 DIAGNOSIS — Z888 Allergy status to other drugs, medicaments and biological substances status: Secondary | ICD-10-CM | POA: Diagnosis not present

## 2015-09-13 DIAGNOSIS — J449 Chronic obstructive pulmonary disease, unspecified: Secondary | ICD-10-CM | POA: Diagnosis present

## 2015-09-13 DIAGNOSIS — G92 Toxic encephalopathy: Secondary | ICD-10-CM | POA: Diagnosis present

## 2015-09-13 DIAGNOSIS — Z7189 Other specified counseling: Secondary | ICD-10-CM | POA: Insufficient documentation

## 2015-09-13 DIAGNOSIS — Z885 Allergy status to narcotic agent status: Secondary | ICD-10-CM

## 2015-09-13 DIAGNOSIS — Z881 Allergy status to other antibiotic agents status: Secondary | ICD-10-CM

## 2015-09-13 DIAGNOSIS — R569 Unspecified convulsions: Secondary | ICD-10-CM | POA: Diagnosis not present

## 2015-09-13 DIAGNOSIS — M25511 Pain in right shoulder: Secondary | ICD-10-CM | POA: Diagnosis present

## 2015-09-13 DIAGNOSIS — J9600 Acute respiratory failure, unspecified whether with hypoxia or hypercapnia: Secondary | ICD-10-CM | POA: Diagnosis not present

## 2015-09-13 DIAGNOSIS — J9622 Acute and chronic respiratory failure with hypercapnia: Secondary | ICD-10-CM | POA: Diagnosis present

## 2015-09-13 DIAGNOSIS — R52 Pain, unspecified: Secondary | ICD-10-CM

## 2015-09-13 DIAGNOSIS — J9601 Acute respiratory failure with hypoxia: Secondary | ICD-10-CM | POA: Diagnosis not present

## 2015-09-13 LAB — CBC
HEMATOCRIT: 42.7 % (ref 36.0–46.0)
Hemoglobin: 14.8 g/dL (ref 12.0–15.0)
MCH: 31.6 pg (ref 26.0–34.0)
MCHC: 34.7 g/dL (ref 30.0–36.0)
MCV: 91.2 fL (ref 78.0–100.0)
PLATELETS: 282 10*3/uL (ref 150–400)
RBC: 4.68 MIL/uL (ref 3.87–5.11)
RDW: 14.9 % (ref 11.5–15.5)
WBC: 10.9 10*3/uL — ABNORMAL HIGH (ref 4.0–10.5)

## 2015-09-13 LAB — COMPREHENSIVE METABOLIC PANEL
ALBUMIN: 4 g/dL (ref 3.5–5.0)
ALT: 20 U/L (ref 14–54)
AST: 21 U/L (ref 15–41)
Alkaline Phosphatase: 65 U/L (ref 38–126)
Anion gap: 11 (ref 5–15)
BUN: 10 mg/dL (ref 6–20)
CHLORIDE: 91 mmol/L — AB (ref 101–111)
CO2: 35 mmol/L — AB (ref 22–32)
CREATININE: 0.44 mg/dL (ref 0.44–1.00)
Calcium: 9.3 mg/dL (ref 8.9–10.3)
GFR calc Af Amer: >60 mL/min (ref >60)
GFR calc non Af Amer: >60 mL/min (ref >60)
GLUCOSE: 102 mg/dL — AB (ref 65–99)
POTASSIUM: 3.5 mmol/L (ref 3.5–5.1)
SODIUM: 137 mmol/L (ref 135–145)
Total Bilirubin: 1.1 mg/dL (ref 0.3–1.2)
Total Protein: 7.1 g/dL (ref 6.5–8.1)

## 2015-09-13 LAB — I-STAT TROPONIN, ED: Troponin i, poc: 0.02 ng/mL (ref 0.00–0.08)

## 2015-09-13 LAB — BRAIN NATRIURETIC PEPTIDE: B NATRIURETIC PEPTIDE 5: 203.8 pg/mL — AB (ref 0.0–100.0)

## 2015-09-13 MED ORDER — ALBUTEROL SULFATE (2.5 MG/3ML) 0.083% IN NEBU
2.5000 mg | INHALATION_SOLUTION | RESPIRATORY_TRACT | Status: DC
  Filled 2015-09-13: qty 3

## 2015-09-13 MED ORDER — FUROSEMIDE 10 MG/ML IJ SOLN
20.0000 mg | Freq: Once | INTRAMUSCULAR | Status: AC
  Administered 2015-09-13: 20 mg via INTRAVENOUS
  Filled 2015-09-13: qty 4

## 2015-09-13 MED ORDER — DILTIAZEM HCL ER COATED BEADS 120 MG PO CP24
120.0000 mg | ORAL_CAPSULE | Freq: Every day | ORAL | Status: DC
  Administered 2015-09-13 – 2015-09-20 (×8): 120 mg via ORAL
  Filled 2015-09-13 (×8): qty 1

## 2015-09-13 MED ORDER — BUDESONIDE 0.25 MG/2ML IN SUSP
0.2500 mg | Freq: Two times a day (BID) | RESPIRATORY_TRACT | Status: DC
  Administered 2015-09-14 – 2015-09-20 (×14): 0.25 mg via RESPIRATORY_TRACT
  Filled 2015-09-13 (×15): qty 2

## 2015-09-13 MED ORDER — ENOXAPARIN SODIUM 40 MG/0.4ML ~~LOC~~ SOLN
40.0000 mg | SUBCUTANEOUS | Status: DC
Start: 2015-09-13 — End: 2015-09-28
  Administered 2015-09-13 – 2015-09-27 (×15): 40 mg via SUBCUTANEOUS
  Filled 2015-09-13 (×16): qty 0.4

## 2015-09-13 MED ORDER — SODIUM CHLORIDE 0.9% FLUSH
3.0000 mL | Freq: Two times a day (BID) | INTRAVENOUS | Status: DC
  Administered 2015-09-13 – 2015-09-28 (×24): 3 mL via INTRAVENOUS

## 2015-09-13 MED ORDER — ACETAMINOPHEN 325 MG PO TABS
650.0000 mg | ORAL_TABLET | Freq: Four times a day (QID) | ORAL | Status: DC | PRN
  Administered 2015-09-14 – 2015-09-27 (×12): 650 mg via ORAL
  Filled 2015-09-13 (×12): qty 2

## 2015-09-13 MED ORDER — ACETAMINOPHEN 650 MG RE SUPP: 650.0000 mg | Freq: Four times a day (QID) | RECTAL | Status: DC | PRN

## 2015-09-13 MED ORDER — CLONAZEPAM 1 MG PO TABS
1.0000 mg | ORAL_TABLET | Freq: Every day | ORAL | Status: DC
  Administered 2015-09-13 – 2015-09-15 (×3): 1 mg via ORAL
  Filled 2015-09-13 (×3): qty 1

## 2015-09-13 MED ORDER — IPRATROPIUM BROMIDE 0.02 % IN SOLN
0.5000 mg | RESPIRATORY_TRACT | Status: DC
  Administered 2015-09-14 (×2): 0.5 mg via RESPIRATORY_TRACT
  Filled 2015-09-13 (×3): qty 2.5

## 2015-09-13 MED ORDER — IPRATROPIUM BROMIDE 0.02 % IN SOLN
0.5000 mg | Freq: Once | RESPIRATORY_TRACT | Status: AC
  Administered 2015-09-13: 0.5 mg via RESPIRATORY_TRACT
  Filled 2015-09-13: qty 2.5

## 2015-09-13 MED ORDER — FUROSEMIDE 20 MG PO TABS
30.0000 mg | ORAL_TABLET | Freq: Every day | ORAL | Status: DC
  Administered 2015-09-14 – 2015-09-15 (×2): 30 mg via ORAL
  Filled 2015-09-13 (×2): qty 2

## 2015-09-13 MED ORDER — ONDANSETRON HCL 4 MG PO TABS: 4.0000 mg | ORAL_TABLET | Freq: Four times a day (QID) | ORAL | Status: DC | PRN

## 2015-09-13 MED ORDER — ASPIRIN 81 MG PO CHEW
81.0000 mg | CHEWABLE_TABLET | ORAL | Status: DC
  Administered 2015-09-14 – 2015-09-20 (×4): 81 mg via ORAL
  Filled 2015-09-13 (×4): qty 1

## 2015-09-13 MED ORDER — ALBUTEROL SULFATE (2.5 MG/3ML) 0.083% IN NEBU
2.5000 mg | INHALATION_SOLUTION | RESPIRATORY_TRACT | Status: DC | PRN
  Administered 2015-09-16 – 2015-09-21 (×4): 2.5 mg via RESPIRATORY_TRACT
  Filled 2015-09-13 (×4): qty 3

## 2015-09-13 MED ORDER — TRAMADOL HCL 50 MG PO TABS
50.0000 mg | ORAL_TABLET | Freq: Once | ORAL | Status: AC
  Administered 2015-09-13: 50 mg via ORAL
  Filled 2015-09-13: qty 1

## 2015-09-13 MED ORDER — ONDANSETRON HCL 4 MG/2ML IJ SOLN: 4.0000 mg | Freq: Three times a day (TID) | INTRAMUSCULAR | Status: DC | PRN

## 2015-09-13 MED ORDER — IOHEXOL 350 MG/ML SOLN
100.0000 mL | Freq: Once | INTRAVENOUS | Status: AC | PRN
  Administered 2015-09-13: 100 mL via INTRAVENOUS

## 2015-09-13 MED ORDER — TRAMADOL HCL 50 MG PO TABS
50.0000 mg | ORAL_TABLET | Freq: Four times a day (QID) | ORAL | Status: DC | PRN
  Administered 2015-09-13 – 2015-09-15 (×8): 50 mg via ORAL
  Filled 2015-09-13 (×8): qty 1

## 2015-09-13 MED ORDER — ALBUTEROL SULFATE (2.5 MG/3ML) 0.083% IN NEBU
5.0000 mg | INHALATION_SOLUTION | Freq: Once | RESPIRATORY_TRACT | Status: AC
  Administered 2015-09-13: 5 mg via RESPIRATORY_TRACT
  Filled 2015-09-13: qty 6

## 2015-09-13 MED ORDER — ONDANSETRON HCL 4 MG/2ML IJ SOLN
4.0000 mg | Freq: Four times a day (QID) | INTRAMUSCULAR | Status: DC | PRN
  Administered 2015-09-15 – 2015-09-28 (×11): 4 mg via INTRAVENOUS
  Filled 2015-09-13 (×13): qty 2

## 2015-09-13 MED ORDER — ALBUTEROL SULFATE (2.5 MG/3ML) 0.083% IN NEBU: 2.5000 mg | INHALATION_SOLUTION | RESPIRATORY_TRACT | Status: DC | PRN

## 2015-09-13 NOTE — ED Provider Notes (Signed)
CSN: [id number]     Arrival date & time 09/13/15  1547 History   First MD Initiated Contact with Patient 09/13/15 [phone]     Chief Complaint  Patient presents with  . Shortness of Breath     HPI  Expand All Collapse All   Shortness of breath onset 11 this morning. Hx of COPD. Increased edema to both legs for a few weeks. Had an abnormal echocardiogram 6 months ago.  Has mitral regurgitation along with aortic stenosis.  No fever chills.  Has some back pain.  Denies diaphoresis.          Past Medical History  Diagnosis Date  . HIP PAIN   . HYPERTENSION   . Restless leg syndrome   . COPD (chronic obstructive pulmonary disease) (HCC)     a. Multiple prior admissions for acute hypoxic respiratory failure in setting of COPD exacerbations +/- pneumonia.  . Peptic ulcer disease     a. 2013: gastric antrum.  Marland Kitchen Hyponatremia   . Tobacco abuse   . Hypomagnesemia   . LBBB (left bundle branch block)     a. Intermittent, likely rate related.  . Pulmonary HTN ([place])     a. Elevated PA pressure by prior echoes.  . Mitral regurgitation     a. Mild MR by echo 2014, not mentioned on 11/2013 (severely calcified annulus).  Marland Kitchen Respiratory failure ([place])     a. Adm 11/2013 with severe hypercarbic and hypoxemic respiratory failure requiring intubation.   . Abnormal echocardiogram     a. 11/2013: decreased EF 45-50%, moderate AS- > subsequent cath 12/02/13 with mildly calcified cors without obstruction, mild AS, EF 60%, mild pulm HTN.  Marland Kitchen Aortic stenosis     a. 11/2013: mod by echo, then mild by cath.  . Pulmonary HTN ([place])     a. 11/2013: mild by cath.  . Wide-complex tachycardia ([place])     a. Felt likely atrial tach with rate-related LBBB.  Marland Kitchen Chronic diastolic CHF (congestive heart failure) ([place])   . Diverticulitis of colon (without mention of hemorrhage) 08/09/2013  . Septic shock ([place]) 12/11/2013  . Acute on chronic combined systolic and diastolic congestive heart failure, NYHA class 4 ([place]) 11/27/2013    Past Surgical History  Procedure Laterality Date  . Abdominal hysterectomy      vaginal hysterectomy  . Dilation and curettage of uterus    . Tonsillectomy    . Esophagogastroduodenoscopy  07/09/2011    Procedure: ESOPHAGOGASTRODUODENOSCOPY (EGD);  Surgeon: Missy Sabins, MD;  Location: [facility];  Service: Endoscopy;  Laterality: N/A;  patient in room 1532  . Cataract extraction w/ intraocular lens  implant, bilateral Bilateral 03/2013    Patient claims it was within the month.   . Left and right heart catheterization with coronary angiogram N/A 12/02/2013    Procedure: LEFT AND RIGHT HEART CATHETERIZATION WITH CORONARY ANGIOGRAM;  Surgeon: Blane Ohara, MD;  Location: [facility] CATH LAB;  Service: Cardiovascular;  Laterality: N/A;  . Laparoscopic sigmoid colectomy  07-03-14    per Dr. Malachi Carl in [place]    Family History  Problem Relation Age of Onset  . Heart disease Sister   . Heart disease Brother   . Dementia Mother   . Anemia Father    Social History  Substance Use Topics  . Smoking status: Current Every Day Smoker -- 50 years    Types: Cigarettes  . Smokeless tobacco: Never Used     Comment: 1/2 pack per day  .  Alcohol Use: No   OB History    No data available     Review of Systems  All other systems reviewed and are negative  Allergies  Tetanus toxoid; Penicillins; Codeine; Erythromycin; Gabapentin; Levaquin; Meloxicam; Methylprednisolone; and Prednisone  Home Medications   Prior to Admission medications   Medication Sig Start Date End Date Taking? Authorizing Provider  aspirin 81 MG tablet Take 81 mg by mouth every other day.    Yes Historical Provider, MD  clonazePAM (KLONOPIN) 1 MG tablet TAKE 1 TABLET BY MOUTH NIGHTLY AT BEDTIME AS NEEDED Patient taking differently: Take 1 mg by mouth at bedtime. For restless legs 06/26/15  Yes Laurey Morale, MD  diltiazem (CARDIZEM CD) 120 MG 24 hr capsule Take 1 capsule (120 mg total) by mouth daily. Patient  taking differently: Take 120 mg by mouth at bedtime.  08/25/15  Yes Fay Records, MD  furosemide (LASIX) 20 MG tablet TAKE 1&1/2 TABLET BY MOUTH EVERY DAY 08/14/15  Yes Fay Records, MD  ondansetron (ZOFRAN) 4 MG tablet Take 4 mg by mouth every 4 (four) hours as needed for nausea or vomiting.   Yes Historical Provider, MD  PROAIR HFA 108 (90 BASE) MCG/ACT inhaler INHALE TWO PUFFS BY MOUTH EVERY FOUR HOURS AS NEEDED Patient taking differently: INHALE TWO PUFFS BY MOUTH EVERY FOUR HOURS AS NEEDED shortness of breath/wheezing 10/31/14  Yes Laurey Morale, MD  Respiratory Therapy Supplies (FLUTTER) DEVI Use as directed 01/26/15  Yes Tanda Rockers, MD  SYMBICORT 80-4.5 MCG/ACT inhaler INHAKLE 2 PUFFS INTO THE LUNGS TWICE DAILY 07/23/15  Yes Brand Males, MD  tiotropium (SPIRIVA) 18 MCG inhalation capsule Place 1 capsule (18 mcg total) into inhaler and inhale daily. 12/03/13  Yes Erick Colace, NP  pantoprazole (PROTONIX) 40 MG tablet Take 1 tablet (40 mg total) by mouth daily. X 2 wks Patient not taking: Reported on 09/13/2015 08/04/15   Rigoberto Noel, MD   BP 128/82 mmHg  Pulse 85  Temp(Src) 98.3 F (36.8 C) (Oral)  Resp 25  SpO2 89% Physical Exam  Constitutional: She is oriented to person, place, and time. She appears well-developed and well-nourished. She appears distressed (Mild-to-moderate respiratory distress with use of accessory muscles.).  HENT:  Head: Normocephalic and atraumatic.  Eyes: Pupils are equal, round, and reactive to light.  Neck: Normal range of motion.  Cardiovascular: Intact distal pulses.   Pulmonary/Chest: No respiratory distress. She has wheezes. She has rales.  Abdominal: Normal appearance. She exhibits no distension.  Musculoskeletal: Normal range of motion. She exhibits edema (1+ pitting edema in ankles bilaterally).  Neurological: She is alert and oriented to person, place, and time. No cranial nerve deficit.  Skin: Skin is warm and dry. No rash noted.  Psychiatric:  She has a normal mood and affect. Her behavior is normal.  Nursing note and vitals reviewed.   ED Course  Procedures (including critical care time) Medications  traMADol (ULTRAM) tablet 50 mg (50 mg Oral Given 09/13/15 1759)  albuterol (PROVENTIL) (2.5 MG/3ML) 0.083% nebulizer solution 5 mg (5 mg Nebulization Given 09/13/15 1916)  ipratropium (ATROVENT) nebulizer solution 0.5 mg (0.5 mg Nebulization Given 09/13/15 1916)  furosemide (LASIX) injection 20 mg (20 mg Intravenous Given 09/13/15 1916)    Labs Review Labs Reviewed  BRAIN NATRIURETIC PEPTIDE - Abnormal; Notable for the following:    B Natriuretic Peptide 203.8 (*)    All other components within normal limits  COMPREHENSIVE METABOLIC PANEL - Abnormal; Notable for the following:  Chloride 91 (*)    CO2 35 (*)    Glucose, Bld 102 (*)    All other components within normal limits  CBC - Abnormal; Notable for the following:    WBC 10.9 (*)    All other components within normal limits  I-STAT TROPOININ, ED    Imaging Review Dg Chest Portable 1 View  09/13/2015  CLINICAL DATA:  Shortness of breath, bilateral lower extremity edema EXAM: PORTABLE CHEST 1 VIEW COMPARISON:  06/23/2015 FINDINGS: Increased interstitial markings, chronic. Mild cephalization after. This appearance suggests chronic compensated CHF without frank interstitial edema. No pleural effusion or pneumothorax. The heart is top-normal in size. IMPRESSION: Chronic interstitial markings.  No frank interstitial edema. No definite pleural effusions. Electronically Signed   By: Julian Hy M.D.   On: 09/13/2015 16:34   I have personally reviewed and evaluated these images and lab results as part of my medical decision-making.   EKG Interpretation   Date/Time:  Sunday September 13 2015 16:08:44 EDT Ventricular Rate:  106 PR Interval:  144 QRS Duration: 155 QT Interval:  405 QTC Calculation: 538 R Axis:   -58 Text Interpretation:  Sinus tachycardia Atrial premature  complexes in  couplets Biatrial enlargement Left bundle branch block Abnormal ekg  Confirmed by Rykar Lebleu  MD, Teshia Mahone ([id number]) on 09/13/2015 4:36:37 PM      MDM   Final diagnoses:  Chronic obstructive pulmonary disease, unspecified COPD type (HCC)        Leonard Schwartz, MD 09/13/15 1949

## 2015-09-13 NOTE — ED Notes (Addendum)
Shortness of breath onset  11 this morning. Hx of COPD. Increased edema to both legs for a few weeks.

## 2015-09-13 NOTE — ED Notes (Signed)
REPORT WILL BE GIVEN ON THE FLOOR 4W 1444-1. AAOX3. PT IN NO APPARENT DISTRESS. THE OPPORTUNITY TO ASK QUESTIONS WAS PROVIDED.

## 2015-09-13 NOTE — H&P (Signed)
[facility] History and Physical  CARLISLE TIMOTHY [id number] DOB: [birth year] DOA: 09/13/2015  Referring physician: Dr. Audie Pinto. PCP: Laurey Morale, MD  Specialists: Dr. Harrington Challenger. Cardiologist.  Chief Complaint: Back pain and shortness of breath.  HPI: Jill White is a 75 y.o. female with history of severe COPD, moderate aortic stenosis, tachycardia presents to the ER after patient started experiencing increasing shortness of breath after patient started developing upper back pain in the morning after patient went to the church. Denies any fall or trauma. Pain is mostly in the upper mid back increases on deep inspiration and movement. This led to patient becoming more short of breath. Patient came to the ER. Chest x-ray does not show anything acute. Patient states the pain is mostly in the upper mid back in a square area and does not radiate. On exam patient does not have any definite spine tenderness. Patient was given nebulizer 1 dose of Lasix 20 mg IV and admitted for respiratory failure and further management of back pain. Patient also recently noticed increasing swelling of her lower extremity for which patient started increasing her Lasix dose from 30 mg daily to 40 mg daily. Patient's Cardizem dose was increased a change from 30 mg 4 times daily to 120 mg at bedtime. Also had a recent Holter monitor.   Review of Systems: As presented in the history of presenting illness, rest negative.  Past Medical History  Diagnosis Date  . HIP PAIN   . HYPERTENSION   . Restless leg syndrome   . COPD (chronic obstructive pulmonary disease) (HCC)     a. Multiple prior admissions for acute hypoxic respiratory failure in setting of COPD exacerbations +/- pneumonia.  . Peptic ulcer disease     a. 2013: gastric antrum.  Marland Kitchen Hyponatremia   . Tobacco abuse   . Hypomagnesemia   . LBBB (left bundle branch block)     a. Intermittent, likely rate related.  . Pulmonary HTN ([place])     a. Elevated PA  pressure by prior echoes.  . Mitral regurgitation     a. Mild MR by echo 2014, not mentioned on 11/2013 (severely calcified annulus).  Marland Kitchen Respiratory failure ([place])     a. Adm 11/2013 with severe hypercarbic and hypoxemic respiratory failure requiring intubation.   . Abnormal echocardiogram     a. 11/2013: decreased EF 45-50%, moderate AS- > subsequent cath 12/02/13 with mildly calcified cors without obstruction, mild AS, EF 60%, mild pulm HTN.  Marland Kitchen Aortic stenosis     a. 11/2013: mod by echo, then mild by cath.  . Pulmonary HTN ([place])     a. 11/2013: mild by cath.  . Wide-complex tachycardia ([place])     a. Felt likely atrial tach with rate-related LBBB.  Marland Kitchen Chronic diastolic CHF (congestive heart failure) ([place])   . Diverticulitis of colon (without mention of hemorrhage) 08/09/2013  . Septic shock ([place]) 12/11/2013  . Acute on chronic combined systolic and diastolic congestive heart failure, NYHA class 4 ([place]) 11/27/2013   Past Surgical History  Procedure Laterality Date  . Abdominal hysterectomy      vaginal hysterectomy  . Dilation and curettage of uterus    . Tonsillectomy    . Esophagogastroduodenoscopy  07/09/2011    Procedure: ESOPHAGOGASTRODUODENOSCOPY (EGD);  Surgeon: Missy Sabins, MD;  Location: [facility];  Service: Endoscopy;  Laterality: N/A;  patient in room 1532  . Cataract extraction w/ intraocular lens  implant, bilateral Bilateral 03/2013    Patient claims it was  within the month.   . Left and right heart catheterization with coronary angiogram N/A 12/02/2013    Procedure: LEFT AND RIGHT HEART CATHETERIZATION WITH CORONARY ANGIOGRAM;  Surgeon: Blane Ohara, MD;  Location: [facility] CATH LAB;  Service: Cardiovascular;  Laterality: N/A;  . Laparoscopic sigmoid colectomy  07-03-14    per Dr. Malachi Carl in [place] History:  reports that she has been smoking Cigarettes.  She has smoked for the past 50 years. She has never used smokeless tobacco. She reports that she does not  drink alcohol or use illicit drugs. Where does patient live Home. Can patient participate in ADLs? Yes.  Allergies  Allergen Reactions  . Tetanus Toxoid Anaphylaxis  . Penicillins Hives    Has patient had a PCN reaction causing immediate rash, facial/tongue/throat swelling, SOB or lightheadedness with hypotension: unknown  Has patient had a PCN reaction causing severe rash involving mucus membranes or skin necrosis: No  Has patient had a PCN reaction that required hospitalization: No  Has patient had a PCN reaction occurring within the last 10 years: No  If all of the above answers are "NO", then may proceed with Cephalosporin use.   . Codeine Nausea And Vomiting and Other (See Comments)    Itching and faint   . Erythromycin Nausea And Vomiting  . Gabapentin     REACTION: severe edema, nausea, dizziness, disorientation  . Levaquin [Levofloxacin In D5w] Other (See Comments)    Joint pain; (states she can take it IV- not by mouth)  . Meloxicam Itching  . Methylprednisolone Hives  . Prednisone Other (See Comments)    GI bleed    Family History:  Family History  Problem Relation Age of Onset  . Heart disease Sister   . Heart disease Brother   . Dementia Mother   . Anemia Father       Prior to Admission medications   Medication Sig Start Date End Date Taking? Authorizing Provider  aspirin 81 MG tablet Take 81 mg by mouth every other day.    Yes Historical Provider, MD  clonazePAM (KLONOPIN) 1 MG tablet TAKE 1 TABLET BY MOUTH NIGHTLY AT BEDTIME AS NEEDED Patient taking differently: Take 1 mg by mouth at bedtime. For restless legs 06/26/15  Yes Laurey Morale, MD  diltiazem (CARDIZEM CD) 120 MG 24 hr capsule Take 1 capsule (120 mg total) by mouth daily. Patient taking differently: Take 120 mg by mouth at bedtime.  08/25/15  Yes Fay Records, MD  furosemide (LASIX) 20 MG tablet TAKE 1&1/2 TABLET BY MOUTH EVERY DAY 08/14/15  Yes Fay Records, MD  ondansetron (ZOFRAN) 4 MG tablet Take 4  mg by mouth every 4 (four) hours as needed for nausea or vomiting.   Yes Historical Provider, MD  PROAIR HFA 108 (90 BASE) MCG/ACT inhaler INHALE TWO PUFFS BY MOUTH EVERY FOUR HOURS AS NEEDED Patient taking differently: INHALE TWO PUFFS BY MOUTH EVERY FOUR HOURS AS NEEDED shortness of breath/wheezing 10/31/14  Yes Laurey Morale, MD  Respiratory Therapy Supplies (FLUTTER) DEVI Use as directed 01/26/15  Yes Tanda Rockers, MD  SYMBICORT 80-4.5 MCG/ACT inhaler INHAKLE 2 PUFFS INTO THE LUNGS TWICE DAILY 07/23/15  Yes Brand Males, MD  tiotropium (SPIRIVA) 18 MCG inhalation capsule Place 1 capsule (18 mcg total) into inhaler and inhale daily. 12/03/13  Yes Erick Colace, NP  pantoprazole (PROTONIX) 40 MG tablet Take 1 tablet (40 mg total) by mouth daily. X 2 wks Patient not  taking: Reported on 09/13/2015 08/04/15   Rigoberto Noel, MD    Physical Exam: Filed Vitals:   09/13/15 1830 09/13/15 1900 09/13/15 2000 09/13/15 2049  BP: 136/73 128/82 140/83 139/87  Pulse: 84 85 97 89  Temp:    98.2 F (36.8 C)  TempSrc:    Oral  Resp: 24 25 30    Height:    5\' 3"  (1.6 m)  Weight:    110 lb 4.8 oz (50.032 kg)  SpO2: 91% 89% 91% 93%     General:  Moderately built and poorly nourished.  Eyes: Anicteric. No pallor.  ENT: No discharge from the ears eyes nose and mouth.  Neck: No JVD appreciated. No mass felt.  Cardiovascular: S1 and S2 heard.  Respiratory: No rhonchi or crepitations.  Abdomen: Soft nontender bowel sounds present.  Skin: No rash.  Musculoskeletal: No edema.  Psychiatric: Appears normal.  Neurologic: Alert awake oriented to time place and person. Moves all extremities.  Labs on Admission:  Basic Metabolic Panel:  Recent Labs Lab 09/13/15 1636  NA 137  K 3.5  CL 91*  CO2 35*  GLUCOSE 102*  BUN 10  CREATININE 0.44  CALCIUM 9.3   Liver Function Tests:  Recent Labs Lab 09/13/15 1636  AST 21  ALT 20  ALKPHOS 65  BILITOT 1.1  PROT 7.1  ALBUMIN 4.0   No  results for input(s): LIPASE, AMYLASE in the last 168 hours. No results for input(s): AMMONIA in the last 168 hours. CBC:  Recent Labs Lab 09/13/15 1636  WBC 10.9*  HGB 14.8  HCT 42.7  MCV 91.2  PLT 282   Cardiac Enzymes: No results for input(s): CKTOTAL, CKMB, CKMBINDEX, TROPONINI in the last 168 hours.  BNP (last 3 results)  Recent Labs  09/13/15 1636  BNP 203.8*    ProBNP (last 3 results) No results for input(s): PROBNP in the last 8760 hours.  CBG: No results for input(s): GLUCAP in the last 168 hours.  Radiological Exams on Admission: Dg Chest Portable 1 View  09/13/2015  CLINICAL DATA:  Shortness of breath, bilateral lower extremity edema EXAM: PORTABLE CHEST 1 VIEW COMPARISON:  06/23/2015 FINDINGS: Increased interstitial markings, chronic. Mild cephalization after. This appearance suggests chronic compensated CHF without frank interstitial edema. No pleural effusion or pneumothorax. The heart is top-normal in size. IMPRESSION: Chronic interstitial markings.  No frank interstitial edema. No definite pleural effusions. Electronically Signed   By: Julian Hy M.D.   On: 09/13/2015 16:34    EKG: Independently reviewed. Sinus tachycardia with LBBB.  Assessment/Plan Principal Problem:   Acute on chronic respiratory failure (HCC) Active Problems:   COPD (chronic obstructive pulmonary disease) (HCC)   COPD exacerbation (HCC)   Mild aortic stenosis   Back pain   Acute respiratory failure ([place])   1. Acute on chronic respiratory failure - probably worsened because of the upper back pain. For now we will treat with nebulizer Pulmicort for COPD along with Lasix for aortic stenosis and CHF. Patient did receive Lasix 20 mg IV and I have continued patient's home dose of 30 mg by mouth daily. Closely follow intake output and metabolic panel. Check cardiac markers. 2. Upper back pain - since this is associated with increasing shortness of breath and has had recent lower  extremity swelling I have ordered CT angiogram of the chest to rule out PE. Patient probably could be from spine fracture if PE is negative. If CT scan does not show any definite evidence of cause for  the pain that may need MRI T spine. At this time patient is on tramadol for pain as patient cannot tolerate narcotics. Check cardiac markers. 3. Aortic stenosis moderate - since patient's shortness of breath has worsened and her daughter was originally planning to have 2-D echo done later this part of the year I have ordered 2-D echo. 4. Chronic LBBB. Sensation in upper back pain check cardiac markers. 5. Chronic sinus tachycardia - has had recent Holter monitor. Continue Cardizem.   DVT Prophylaxis Lovenox.  Code Status: Full code.  Family Communication: Discussed with patient's daughter.  Disposition Plan: Admit to inpatient.    Alekxander Isola N. [facility] Pager [phone].  If 7PM-7AM, please contact night-coverage [email or web address] Password [facility] 09/13/2015, 9:53 PM

## 2015-09-14 ENCOUNTER — Ambulatory Visit: Payer: Medicare Other | Admitting: Internal Medicine

## 2015-09-14 ENCOUNTER — Inpatient Hospital Stay (HOSPITAL_COMMUNITY): Payer: Medicare Other

## 2015-09-14 ENCOUNTER — Encounter (HOSPITAL_COMMUNITY): Payer: Self-pay | Admitting: Cardiology

## 2015-09-14 DIAGNOSIS — M546 Pain in thoracic spine: Secondary | ICD-10-CM

## 2015-09-14 DIAGNOSIS — J9620 Acute and chronic respiratory failure, unspecified whether with hypoxia or hypercapnia: Secondary | ICD-10-CM

## 2015-09-14 DIAGNOSIS — J441 Chronic obstructive pulmonary disease with (acute) exacerbation: Secondary | ICD-10-CM

## 2015-09-14 DIAGNOSIS — R0600 Dyspnea, unspecified: Secondary | ICD-10-CM

## 2015-09-14 LAB — CBC
HEMATOCRIT: 44.1 % (ref 36.0–46.0)
HEMOGLOBIN: 14.1 g/dL (ref 12.0–15.0)
MCH: 30.9 pg (ref 26.0–34.0)
MCHC: 32 g/dL (ref 30.0–36.0)
MCV: 96.5 fL (ref 78.0–100.0)
PLATELETS: 328 10*3/uL (ref 150–400)
RBC: 4.57 MIL/uL (ref 3.87–5.11)
RDW: 15.4 % (ref 11.5–15.5)
WBC: 11.8 10*3/uL — ABNORMAL HIGH (ref 4.0–10.5)

## 2015-09-14 LAB — TROPONIN I
TROPONIN I: 0.03 ng/mL (ref <0.031)
Troponin I: 0.06 ng/mL — ABNORMAL HIGH (ref <0.031)
Troponin I: 0.03 ng/mL (ref <0.031)

## 2015-09-14 LAB — ECHOCARDIOGRAM COMPLETE
HEIGHTINCHES: 63 in
Weight: 1785.6 oz

## 2015-09-14 LAB — BASIC METABOLIC PANEL
ANION GAP: 12 (ref 5–15)
BUN: 14 mg/dL (ref 6–20)
CALCIUM: 9.3 mg/dL (ref 8.9–10.3)
CHLORIDE: 90 mmol/L — AB (ref 101–111)
CO2: 37 mmol/L — AB (ref 22–32)
Creatinine, Ser: 0.69 mg/dL (ref 0.44–1.00)
GFR calc non Af Amer: >60 mL/min (ref >60)
Glucose, Bld: 84 mg/dL (ref 65–99)
POTASSIUM: 4.1 mmol/L (ref 3.5–5.1)
Sodium: 139 mmol/L (ref 135–145)

## 2015-09-14 LAB — BRAIN NATRIURETIC PEPTIDE: B NATRIURETIC PEPTIDE 5: 281.8 pg/mL — AB (ref 0.0–100.0)

## 2015-09-14 LAB — MAGNESIUM: MAGNESIUM: 1.9 mg/dL (ref 1.7–2.4)

## 2015-09-14 MED ORDER — ALBUTEROL SULFATE (2.5 MG/3ML) 0.083% IN NEBU
2.5000 mg | INHALATION_SOLUTION | Freq: Four times a day (QID) | RESPIRATORY_TRACT | Status: DC
  Administered 2015-09-14 (×2): 2.5 mg via RESPIRATORY_TRACT
  Filled 2015-09-14 (×2): qty 3

## 2015-09-14 MED ORDER — NICOTINE 14 MG/24HR TD PT24
14.0000 mg | MEDICATED_PATCH | Freq: Every day | TRANSDERMAL | Status: DC
  Administered 2015-09-14 – 2015-09-20 (×7): 14 mg via TRANSDERMAL
  Filled 2015-09-14 (×7): qty 1

## 2015-09-14 MED ORDER — LIDOCAINE 5 % EX PTCH
1.0000 | MEDICATED_PATCH | CUTANEOUS | Status: DC
  Filled 2015-09-14 (×2): qty 1

## 2015-09-14 MED ORDER — METHOCARBAMOL 500 MG PO TABS
500.0000 mg | ORAL_TABLET | Freq: Four times a day (QID) | ORAL | Status: DC | PRN
  Administered 2015-09-14 – 2015-09-15 (×5): 500 mg via ORAL
  Filled 2015-09-14 (×5): qty 1

## 2015-09-14 MED ORDER — LORAZEPAM 1 MG PO TABS
1.0000 mg | ORAL_TABLET | Freq: Once | ORAL | Status: AC
  Administered 2015-09-14: 1 mg via ORAL
  Filled 2015-09-14: qty 1

## 2015-09-14 MED ORDER — IPRATROPIUM-ALBUTEROL 0.5-2.5 (3) MG/3ML IN SOLN
3.0000 mL | Freq: Four times a day (QID) | RESPIRATORY_TRACT | Status: DC
  Administered 2015-09-14 – 2015-09-20 (×24): 3 mL via RESPIRATORY_TRACT
  Filled 2015-09-14 (×25): qty 3

## 2015-09-14 NOTE — Progress Notes (Addendum)
PROGRESS NOTE  Jill White [id number] DOB: [birth year] DOA: 09/13/2015 PCP: Laurey Morale, MD  Assessment/Plan: Acute on chronic respiratory failure - probably worsened because of the upper back pain.  - treat with nebulizer Pulmicort for COPD along with Lasix for aortic stenosis and CHF.  -received Lasix 20 mg IV and continued patient's home dose of 30 mg by mouth daily -BNP elevated >200  Upper back pain -  -CT angiogram of the chest to rule out PE negative but did show compression fx-- family states are old -MRI T spine.  -patient is on tramadol for pain as patient cannot tolerate narcotics -offered lidocaine patch but patient declined  Aortic stenosis moderate -  -repeat echo  Chronic LBBB. Sensation in upper back pain check cardiac markers.  Chronic sinus tachycardia - has had recent Holter monitor. Continue Cardizem.   Code Status: full Family Communication: patient and family at bedside Disposition Plan:    Consultants:    Procedures:      HPI/Subjective: Refused to answer most questions directly Still with pain requesting her cardiologist be called C/o cramping pain in back through to chest  Objective: Filed Vitals:   09/13/15 2049 09/14/15 0656  BP: 139/87 144/65  Pulse: 89 75  Temp: 98.2 F (36.8 C) 97.5 F (36.4 C)  Resp:  25    Intake/Output Summary (Last 24 hours) at 09/14/15 1513 Last data filed at 09/14/15 0419  Gross per 24 hour  Intake    720 ml  Output      0 ml  Net    720 ml   Filed Weights   09/13/15 2049 09/14/15 0500  Weight: 50.032 kg (110 lb 4.8 oz) 50.621 kg (111 lb 9.6 oz)    Exam:   General:  Uncooperative, would not answer questions-- "look in my chart"  Cardiovascular: rrr  Respiratory: diminished, no wheezing  Abdomen: +Bs, soft  Musculoskeletal: + edema   Data Reviewed: Basic Metabolic Panel:  Recent Labs Lab 09/13/15 1636 09/14/15 0457 09/14/15 1000  NA 137 139  --   K 3.5 4.1  --   CL  91* 90*  --   CO2 35* 37*  --   GLUCOSE 102* 84  --   BUN 10 14  --   CREATININE 0.44 0.69  --   CALCIUM 9.3 9.3  --   MG  --   --  1.9   Liver Function Tests:  Recent Labs Lab 09/13/15 1636  AST 21  ALT 20  ALKPHOS 65  BILITOT 1.1  PROT 7.1  ALBUMIN 4.0   No results for input(s): LIPASE, AMYLASE in the last 168 hours. No results for input(s): AMMONIA in the last 168 hours. CBC:  Recent Labs Lab 09/13/15 1636 09/14/15 0457  WBC 10.9* 11.8*  HGB 14.8 14.1  HCT 42.7 44.1  MCV 91.2 96.5  PLT 282 328   Cardiac Enzymes:  Recent Labs Lab 09/13/15 2228 09/14/15 0457 09/14/15 1000  TROPONINI 0.06* 0.03 0.03   BNP (last 3 results)  Recent Labs  09/13/15 1636  BNP 203.8*    ProBNP (last 3 results) No results for input(s): PROBNP in the last 8760 hours.  CBG: No results for input(s): GLUCAP in the last 168 hours.  No results found for this or any previous visit (from the past 240 hour(s)).   Studies: Ct Angio Chest Pe W/cm &/or Wo Cm  09/13/2015  CLINICAL DATA:  75 year old female with shortness of breath and upper back pain. EXAM: CT ANGIOGRAPHY  CHEST WITH CONTRAST TECHNIQUE: Multidetector CT imaging of the chest was performed using the standard protocol during bolus administration of intravenous contrast. Multiplanar CT image reconstructions and MIPs were obtained to evaluate the vascular anatomy. CONTRAST:  193mL OMNIPAQUE IOHEXOL 350 MG/ML SOLN COMPARISON:  Chest radiograph dated 09/13/2015 FINDINGS: There is emphysematous changes of the lungs. No focal consolidation, pleural effusion, or pneumothorax. The central airways are patent. There is atherosclerotic calcification of the thoracic aorta. No CT evidence of pulmonary embolism. There is no cardiomegaly or pericardial effusion. There is coronary vascular calcification. There is no hilar or mediastinal adenopathy. The esophagus and the thyroid gland appear unremarkable. There is no axillary adenopathy. The  chest wall soft tissues appear unremarkable. There is osteopenia with degenerative changes of the spine. C6 compression fracture with anterior wedging, likely old. There is T8 compression fracture with mild sclerotic changes, age indeterminate, likely old. Clinical correlation is recommended. The visualized upper abdomen appears unremarkable. Review of the MIP images confirms the above findings. IMPRESSION: No CT evidence of pulmonary embolism. Osteopenia with multilevel compression fractures of the thoracic spine, age indeterminate, likely old. Clinical correlation is recommended. Electronically Signed   By: Anner Crete M.D.   On: 09/13/2015 23:43   Dg Chest Portable 1 View  09/13/2015  CLINICAL DATA:  Shortness of breath, bilateral lower extremity edema EXAM: PORTABLE CHEST 1 VIEW COMPARISON:  06/23/2015 FINDINGS: Increased interstitial markings, chronic. Mild cephalization after. This appearance suggests chronic compensated CHF without frank interstitial edema. No pleural effusion or pneumothorax. The heart is top-normal in size. IMPRESSION: Chronic interstitial markings.  No frank interstitial edema. No definite pleural effusions. Electronically Signed   By: Julian Hy M.D.   On: 09/13/2015 16:34    Scheduled Meds: . albuterol  2.5 mg Nebulization QID  . aspirin  81 mg Oral QODAY  . budesonide (PULMICORT) nebulizer solution  0.25 mg Nebulization BID  . clonazePAM  1 mg Oral QHS  . diltiazem  120 mg Oral QHS  . enoxaparin (LOVENOX) injection  40 mg Subcutaneous Q24H  . furosemide  30 mg Oral Daily  . ipratropium  0.5 mg Nebulization Q4H  . LORazepam  1 mg Oral Once  . nicotine  14 mg Transdermal Daily  . sodium chloride flush  3 mL Intravenous Q12H   Continuous Infusions:  Antibiotics Given (last 72 hours)    None      Principal Problem:   Acute on chronic respiratory failure (HCC) Active Problems:   COPD (chronic obstructive pulmonary disease) (HCC)   COPD exacerbation  (HCC)   Mild aortic stenosis   Back pain   Acute respiratory failure ([place])    Time spent: 25 min    [facility] Pager [phone]. If 7PM-7AM, please contact night-coverage at [email or web address], password [facility] 09/14/2015, 3:13 PM  LOS: 1 day

## 2015-09-14 NOTE — Consult Note (Addendum)
Reason for Consult: shortness of breath   Referring Physician:  Dr. Federico Flake  PCP:  Laurey Morale, MD  Jill White:Dr. Aura Fey [place] is an 75 y.o. female.    Chief Complaint: admitted 09/13/15 with back pain and SOB.    HPI: Asked to ss 75 year old female followed by Dr. Harrington Challenger for an echo in the past (2015) with mild LV dysfunction and moderate to severe AS, she underwent R and L heart cath that showed calcified coronary arteries but no significant CAD.  And mild AS with mean gradient of 28mHg, mildly elevated R sided pressures EF at 60%.  Also his of racing HR.  Wore holter 24 Hr. With short runs of atrial tach, also occ wenkebach. But overall HR was 99. Her dilt po was increased to 120 CD.  Also with HTN, COPD.  Last echo 01/2015 with moderate aortic stensosis and increase of gradient across the valve.  She was to repeat echo in Feb or March.  Is ordered here.   With increase of cardiazem she began with lower ext edema this was on short acting and now back on her usual dose of CD.  She was taking more lasix due to swelling.   Now admitted with upper back pain and increased SOB.  She had sudden back pain and then the back pain would increase with respirations.  No chest pain.   EKG ST with LBBB.  One troponin mildly elevated  0.02>>0.006>>0.03>>0.03, CT of chest neg. For PE, + coronary calcifications.  CXR no acute edema.   Currently pain is better but still present with movement.  + harsh cough.  No chest pain.  SOB comes and goes.   Tele with SR with PACs and non conducted PACs with HR to 40    Past Medical History  Diagnosis Date  . HIP PAIN   . HYPERTENSION   . Restless leg syndrome   . COPD (chronic obstructive pulmonary disease) (HCC)     a. Multiple prior admissions for acute hypoxic respiratory failure in setting of COPD exacerbations +/- pneumonia.  . Peptic ulcer disease     a. 2013: gastric antrum.  .Marland KitchenHyponatremia   . Tobacco abuse   .  Hypomagnesemia   . LBBB (left bundle branch block)     a. Intermittent, likely rate related.  . Pulmonary HTN ([facility]     a. Elevated PA pressure by prior echoes.  . Mitral regurgitation     a. Mild MR by echo 2014, not mentioned on 11/2013 (severely calcified annulus).  .Marland KitchenRespiratory failure ([facility]     a. Adm 11/2013 with severe hypercarbic and hypoxemic respiratory failure requiring intubation.   . Abnormal echocardiogram     a. 11/2013: decreased EF 45-50%, moderate AS- > subsequent cath 12/02/13 with mildly calcified cors without obstruction, mild AS, EF 60%, mild pulm HTN.  .Marland KitchenAortic stenosis     a. 11/2013: mod by echo, then mild by cath.  . Pulmonary HTN ([facility]     a. 11/2013: mild by cath.  . Wide-complex tachycardia ([facility]     a. Felt likely atrial tach with rate-related LBBB.  .Marland KitchenChronic diastolic CHF (congestive heart failure) ([facility] [place]   . Diverticulitis of colon (without mention of hemorrhage) 08/09/2013  . Septic shock ([facility] 12/11/2013  . Acute on chronic combined systolic and diastolic congestive heart failure, NYHA class 4 ([facility] 11/27/2013    Past Surgical History  Procedure Laterality  Date  . Abdominal hysterectomy      vaginal hysterectomy  . Dilation and curettage of uterus    . Tonsillectomy    . Esophagogastroduodenoscopy  07/09/2011    Procedure: ESOPHAGOGASTRODUODENOSCOPY (EGD);  Surgeon: Missy Sabins, MD;  Location: [facility];  Service: Endoscopy;  Laterality: N/A;  patient in room 1532  . Cataract extraction w/ intraocular lens  implant, bilateral Bilateral 03/2013    Patient claims it was within the month.   . Left and right heart catheterization with coronary angiogram N/A 12/02/2013    Procedure: LEFT AND RIGHT HEART CATHETERIZATION WITH CORONARY ANGIOGRAM;  Surgeon: Blane Ohara, MD;  Location: [facility] CATH LAB;  Service: Cardiovascular;  Laterality: N/A;  . Laparoscopic sigmoid colectomy  07-03-14    per Dr. Malachi Carl in [place]     Family History  Problem  Relation Age of Onset  . Heart disease Sister   . Heart disease Brother   . Dementia Mother   . Anemia Father    Social History:  reports that she has been smoking Cigarettes.  She has smoked for the past 50 years. She has never used smokeless tobacco. She reports that she does not drink alcohol or use illicit drugs.  Allergies:  Allergies  Allergen Reactions  . Tetanus Toxoid Anaphylaxis  . Penicillins Hives    Has patient had a PCN reaction causing immediate rash, facial/tongue/throat swelling, SOB or lightheadedness with hypotension: unknown  Has patient had a PCN reaction causing severe rash involving mucus membranes or skin necrosis: No  Has patient had a PCN reaction that required hospitalization: No  Has patient had a PCN reaction occurring within the last 10 years: No  If all of the above answers are "NO", then may proceed with Cephalosporin use.   . Codeine Nausea And Vomiting and Other (See Comments)    Itching and faint   . Erythromycin Nausea And Vomiting  . Gabapentin     REACTION: severe edema, nausea, dizziness, disorientation  . Levaquin [Levofloxacin In D5w] Other (See Comments)    Joint pain; (states she can take it IV- not by mouth)  . Meloxicam Itching  . Methylprednisolone Hives  . Prednisone Other (See Comments)    GI bleed   OUTPATIENT MEDICATIONS: No current facility-administered medications on file prior to encounter.   Current Outpatient Prescriptions on File Prior to Encounter  Medication Sig Dispense Refill  . aspirin 81 MG tablet Take 81 mg by mouth every other day.     . clonazePAM (KLONOPIN) 1 MG tablet TAKE 1 TABLET BY MOUTH NIGHTLY AT BEDTIME AS NEEDED (Patient taking differently: Take 1 mg by mouth at bedtime. For restless legs) 90 tablet 1  . diltiazem (CARDIZEM CD) 120 MG 24 hr capsule Take 1 capsule (120 mg total) by mouth daily. (Patient taking differently: Take 120 mg by mouth at bedtime. ) 90 capsule 3  . furosemide (LASIX) 20 MG tablet  TAKE 1&1/2 TABLET BY MOUTH EVERY DAY 90 tablet 1  . ondansetron (ZOFRAN) 4 MG tablet Take 4 mg by mouth every 4 (four) hours as needed for nausea or vomiting.    Marland Kitchen PROAIR HFA 108 (90 BASE) MCG/ACT inhaler INHALE TWO PUFFS BY MOUTH EVERY FOUR HOURS AS NEEDED (Patient taking differently: INHALE TWO PUFFS BY MOUTH EVERY FOUR HOURS AS NEEDED shortness of breath/wheezing) 8.5 Inhaler 3  . Respiratory Therapy Supplies (FLUTTER) DEVI Use as directed 1 each 0  . SYMBICORT 80-4.5 MCG/ACT inhaler INHAKLE 2 PUFFS INTO THE LUNGS  TWICE DAILY 10.2 g 3  . tiotropium (SPIRIVA) 18 MCG inhalation capsule Place 1 capsule (18 mcg total) into inhaler and inhale daily. 30 capsule 12  . pantoprazole (PROTONIX) 40 MG tablet Take 1 tablet (40 mg total) by mouth daily. X 2 wks (Patient not taking: Reported on 09/13/2015) 14 tablet 0   CURRENT MEDICATIONS: Scheduled Meds: . albuterol  2.5 mg Nebulization QID  . aspirin  81 mg Oral QODAY  . budesonide (PULMICORT) nebulizer solution  0.25 mg Nebulization BID  . clonazePAM  1 mg Oral QHS  . diltiazem  120 mg Oral QHS  . enoxaparin (LOVENOX) injection  40 mg Subcutaneous Q24H  . furosemide  30 mg Oral Daily  . ipratropium  0.5 mg Nebulization Q4H  . nicotine  14 mg Transdermal Daily  . sodium chloride flush  3 mL Intravenous Q12H   Continuous Infusions:  PRN Meds:.acetaminophen **OR** acetaminophen, albuterol, methocarbamol, ondansetron **OR** ondansetron (ZOFRAN) IV, traMADol   Results for orders placed or performed during the hospital encounter of 09/13/15 (from the past 48 hour(s))  Brain natriuretic peptide (order ONLY if patient c/o SOB)     Status: Abnormal   Collection Time: 09/13/15  4:36 PM  Result Value Ref Range   B Natriuretic Peptide 203.8 (H) 0.0 - 100.0 pg/mL  Comprehensive metabolic panel     Status: Abnormal   Collection Time: 09/13/15  4:36 PM  Result Value Ref Range   Sodium 137 135 - 145 mmol/L   Potassium 3.5 3.5 - 5.1 mmol/L   Chloride 91  (L) 101 - 111 mmol/L   CO2 35 (H) 22 - 32 mmol/L   Glucose, Bld 102 (H) 65 - 99 mg/dL   BUN 10 6 - 20 mg/dL   Creatinine, Ser 0.44 0.44 - 1.00 mg/dL   Calcium 9.3 8.9 - 10.3 mg/dL   Total Protein 7.1 6.5 - 8.1 g/dL   Albumin 4.0 3.5 - 5.0 g/dL   AST 21 15 - 41 U/L   ALT 20 14 - 54 U/L   Alkaline Phosphatase 65 38 - 126 U/L   Total Bilirubin 1.1 0.3 - 1.2 mg/dL   GFR calc non Af Amer >60 >60 mL/min   GFR calc Af Amer >60 >60 mL/min    Comment: (NOTE) The eGFR has been calculated using the CKD EPI equation. This calculation has not been validated in all clinical situations. eGFR's persistently <60 mL/min signify possible Chronic Kidney Disease.    Anion gap 11 5 - 15  CBC     Status: Abnormal   Collection Time: 09/13/15  4:36 PM  Result Value Ref Range   WBC 10.9 (H) 4.0 - 10.5 K/uL   RBC 4.68 3.87 - 5.11 MIL/uL   Hemoglobin 14.8 12.0 - 15.0 g/dL   HCT 42.7 36.0 - 46.0 %   MCV 91.2 78.0 - 100.0 fL   MCH 31.6 26.0 - 34.0 pg   MCHC 34.7 30.0 - 36.0 g/dL   RDW 14.9 11.5 - 15.5 %   Platelets 282 150 - 400 K/uL  I-stat troponin, ED     Status: None   Collection Time: 09/13/15  4:44 PM  Result Value Ref Range   Troponin i, poc 0.02 0.00 - 0.08 ng/mL   Comment 3            Comment: Due to the release kinetics of cTnI, a negative result within the first hours of the onset of symptoms does not rule out myocardial infarction with certainty. If  myocardial infarction is still suspected, repeat the test at appropriate intervals.   Troponin I (q 6hr x 3)     Status: Abnormal   Collection Time: 09/13/15 10:28 PM  Result Value Ref Range   Troponin I 0.06 (H) <0.031 ng/mL    Comment:        PERSISTENTLY INCREASED TROPONIN VALUES IN THE RANGE OF 0.04-0.49 ng/mL CAN BE SEEN IN:       -UNSTABLE ANGINA       -CONGESTIVE HEART FAILURE       -MYOCARDITIS       -CHEST TRAUMA       -ARRYHTHMIAS       -LATE PRESENTING MYOCARDIAL INFARCTION       -COPD   CLINICAL FOLLOW-UP  RECOMMENDED.   Troponin I (q 6hr x 3)     Status: None   Collection Time: 09/14/15  4:57 AM  Result Value Ref Range   Troponin I 0.03 <0.031 ng/mL    Comment:        NO INDICATION OF MYOCARDIAL INJURY.   Basic metabolic panel     Status: Abnormal   Collection Time: 09/14/15  4:57 AM  Result Value Ref Range   Sodium 139 135 - 145 mmol/L   Potassium 4.1 3.5 - 5.1 mmol/L   Chloride 90 (L) 101 - 111 mmol/L   CO2 37 (H) 22 - 32 mmol/L   Glucose, Bld 84 65 - 99 mg/dL   BUN 14 6 - 20 mg/dL   Creatinine, Ser 0.69 0.44 - 1.00 mg/dL   Calcium 9.3 8.9 - 10.3 mg/dL   GFR calc non Af Amer >60 >60 mL/min   GFR calc Af Amer >60 >60 mL/min    Comment: (NOTE) The eGFR has been calculated using the CKD EPI equation. This calculation has not been validated in all clinical situations. eGFR's persistently <60 mL/min signify possible Chronic Kidney Disease.    Anion gap 12 5 - 15  CBC     Status: Abnormal   Collection Time: 09/14/15  4:57 AM  Result Value Ref Range   WBC 11.8 (H) 4.0 - 10.5 K/uL   RBC 4.57 3.87 - 5.11 MIL/uL   Hemoglobin 14.1 12.0 - 15.0 g/dL   HCT 44.1 36.0 - 46.0 %   MCV 96.5 78.0 - 100.0 fL   MCH 30.9 26.0 - 34.0 pg   MCHC 32.0 30.0 - 36.0 g/dL   RDW 15.4 11.5 - 15.5 %   Platelets 328 150 - 400 K/uL  Troponin I (q 6hr x 3)     Status: None   Collection Time: 09/14/15 10:00 AM  Result Value Ref Range   Troponin I 0.03 <0.031 ng/mL    Comment:        NO INDICATION OF MYOCARDIAL INJURY.   Magnesium     Status: None   Collection Time: 09/14/15 10:00 AM  Result Value Ref Range   Magnesium 1.9 1.7 - 2.4 mg/dL   Ct Angio Chest Pe W/cm &/or Wo Cm  09/13/2015  CLINICAL DATA:  75 year old female with shortness of breath and upper back pain. EXAM: CT ANGIOGRAPHY CHEST WITH CONTRAST TECHNIQUE: Multidetector CT imaging of the chest was performed using the standard protocol during bolus administration of intravenous contrast. Multiplanar CT image reconstructions and MIPs were  obtained to evaluate the vascular anatomy. CONTRAST:  177m OMNIPAQUE IOHEXOL 350 MG/ML SOLN COMPARISON:  Chest radiograph dated 09/13/2015 FINDINGS: There is emphysematous changes of the lungs. No focal consolidation, pleural effusion, or  pneumothorax. The central airways are patent. There is atherosclerotic calcification of the thoracic aorta. No CT evidence of pulmonary embolism. There is no cardiomegaly or pericardial effusion. There is coronary vascular calcification. There is no hilar or mediastinal adenopathy. The esophagus and the thyroid gland appear unremarkable. There is no axillary adenopathy. The chest wall soft tissues appear unremarkable. There is osteopenia with degenerative changes of the spine. C6 compression fracture with anterior wedging, likely old. There is T8 compression fracture with mild sclerotic changes, age indeterminate, likely old. Clinical correlation is recommended. The visualized upper abdomen appears unremarkable. Review of the MIP images confirms the above findings. IMPRESSION: No CT evidence of pulmonary embolism. Osteopenia with multilevel compression fractures of the thoracic spine, age indeterminate, likely old. Clinical correlation is recommended. Electronically Signed   By: Anner Crete M.D.   On: 09/13/2015 23:43   Dg Chest Portable 1 View  09/13/2015  CLINICAL DATA:  Shortness of breath, bilateral lower extremity edema EXAM: PORTABLE CHEST 1 VIEW COMPARISON:  06/23/2015 FINDINGS: Increased interstitial markings, chronic. Mild cephalization after. This appearance suggests chronic compensated CHF without frank interstitial edema. No pleural effusion or pneumothorax. The heart is top-normal in size. IMPRESSION: Chronic interstitial markings.  No frank interstitial edema. No definite pleural effusions. Electronically Signed   By: Julian Hy M.D.   On: 09/13/2015 16:34    ROS: General:+ recent cold in Feb. no fevers, no weight changes Skin:no rashes or  ulcers HEENT:no blurred vision, no congestion CV:see HPI PUL:see HPI GI:no diarrhea constipation or melena, no indigestion GU:no hematuria, no dysuria MS:no joint pain, no claudication Neuro:no syncope, no lightheadedness Endo:no diabetes, no thyroid disease   Blood pressure 144/65, pulse 75, temperature 97.5 F (36.4 C), temperature source Oral, resp. rate 25, height '5\' 3"'$  (1.6 m), weight 111 lb 9.6 oz (50.621 kg), SpO2 94 %.  Wt Readings from Last 3 Encounters:  09/14/15 111 lb 9.6 oz (50.621 kg)  09/03/15 114 lb (51.71 kg)  08/14/15 113 lb (51.256 kg)    TK:ZSWFUXN:ATFTDDUK affect, NAD, though increased SOB at times and with movement. + back pain with movement. Skin:Warm and dry, brisk capillary refill HEENT:normocephalic, sclera clear, mucus membranes moist Neck:supple, + JVD, no bruits  Heart:S1S2 RRR with III/6 systolic murmur though muffled with rhonchi, gall[place], rub or click Lungs: without rales, + rhonchi, + wheezes harsh cough GUR:KYHC, non tender, + BS, do not palpate liver spleen or masses Ext:no lower ext edema- now, 2+ pedal pulses, 2+ radial pulses, + varicosities  Neuro:alert and oriented X 3, MAE, follows commands, + facial symmetry  Assessment/Plan Principal Problem:   Acute on chronic respiratory failure (HCC) Active Problems:   COPD (chronic obstructive pulmonary disease) (HCC)   COPD exacerbation (HCC)   Mild aortic stenosis   Back pain   Acute respiratory failure ([place])  1. SOB with back pain, possible bronchospasm causing back pain and pain with deep breath neg CT for PE.  Dr. Tamala Julian to see.  2. Moderate AS on echo in 2016 agree need to recheck echo  3. Hx of atrial tach on recent holter, also hx of brady, here on tele she has episodes of PACs and bigeminy non conducted  PACs.    4. Tobacco use on nicoderm patch.   [place]  Nurse Practitioner Certified [place] Pager [phone] or after 5pm or weekends call  [phone] 09/14/2015, 10:47 AM  The patient has been seen in conjunction with Cecilie Kicks, NP. All aspects of care have been  considered and discussed. The patient has been personally interviewed, examined, and all clinical data has been reviewed.   Admitted with dyspnea and back discomfort. Pulmonary embolism is been excluded. Back discomfort seems to be mechanical/musculoskeletal.  Repeat echocardiogram demonstrates severe aortic stenosis with mild left ventricular hypertrophy. BNP is minimally elevated. No evidence of heart failure on chest x-ray.  ECG demonstrates left bundle branch block with QRS duration 155 ms.  There is no obvious evidence of significant decompensation of heart failure. The aortic valve will eventually need to be addressed from the standpoint of SAVR or TAVR.  Recommend treating any underlying pulmonary problems. I do not think diuresis is necessary.

## 2015-09-14 NOTE — Progress Notes (Signed)
Patient does not want treatments at night due to restless leg syndrom. RT will do am assessment

## 2015-09-14 NOTE — Progress Notes (Signed)
  Echocardiogram 2D Echocardiogram has been performed.  Darlina Sicilian M 09/14/2015, 1:51 PM

## 2015-09-14 NOTE — Progress Notes (Signed)
Patient refused bed alarm on. Stated "I don't care who you talk to but the bed alarm has to be off". Re educated that its for her safety but  Still insistent and refuses. Reiterated to use call light or to call before getting up .

## 2015-09-15 ENCOUNTER — Inpatient Hospital Stay (HOSPITAL_COMMUNITY): Payer: Medicare Other

## 2015-09-15 DIAGNOSIS — R0602 Shortness of breath: Secondary | ICD-10-CM | POA: Insufficient documentation

## 2015-09-15 DIAGNOSIS — T148 Other injury of unspecified body region: Secondary | ICD-10-CM

## 2015-09-15 DIAGNOSIS — Z720 Tobacco use: Secondary | ICD-10-CM

## 2015-09-15 DIAGNOSIS — J449 Chronic obstructive pulmonary disease, unspecified: Secondary | ICD-10-CM | POA: Insufficient documentation

## 2015-09-15 MED ORDER — GUAIFENESIN ER 600 MG PO TB12
600.0000 mg | ORAL_TABLET | Freq: Two times a day (BID) | ORAL | Status: DC
  Administered 2015-09-15 – 2015-09-20 (×9): 600 mg via ORAL
  Filled 2015-09-15 (×10): qty 1

## 2015-09-15 NOTE — Progress Notes (Addendum)
Subjective: still with back pain   Objective: Vital signs in last 24 hours: Temp:  [97.8 F (36.6 C)-98.5 F (36.9 C)] 98.4 F (36.9 C) (03/28 0507) Pulse Rate:  [85-89] 89 (03/28 0507) Resp:  [22-23] 22 (03/28 0507) BP: (119-131)/(50-68) 119/54 mmHg (03/28 0507) SpO2:  [88 %-95 %] 88 % (03/28 0852) Weight:  [111 lb (50.349 kg)-112 lb 7 oz (51 kg)] 112 lb 7 oz (51 kg) (03/28 0507) Weight change: 2 lb 2.2 oz (0.968 kg)   Intake/Output from previous day: 03/27 0701 - 03/28 0700 In: 910 [P.O.:910] Out: -  Intake/Output this shift:    PE: General:Pleasant affect, NAD Skin:Warm and dry, brisk capillary refill HEENT:normocephalic, sclera clear, mucus membranes moist Heart:S1S2 RRR with [id number] sytolic murmur, no gallup, rub or click Lungs: without rales,++ rhonchi and wheezes [id number], non tender, + BS, do not palpate liver spleen or masses Ext:no lower ext edema,  2+ radial pulses Neuro:alert and oriented X 3, MAE, follows commands, + facial symmetry   Lab Results:  Recent Labs  09/13/15 1636 09/14/15 0457  WBC 10.9* 11.8*  HGB 14.8 14.1  HCT 42.7 44.1  PLT 282 328   BMET  Recent Labs  09/13/15 1636 09/14/15 0457  NA 137 139  K 3.5 4.1  CL 91* 90*  CO2 35* 37*  GLUCOSE 102* 84  BUN 10 14  CREATININE 0.44 0.69  CALCIUM 9.3 9.3    Recent Labs  09/14/15 0457 09/14/15 1000  TROPONINI 0.03 0.03    Lab Results  Component Value Date   CHOL 166 11/04/2010   HDL 71.70 11/04/2010   LDLCALC 73 11/04/2010   TRIG 129 11/28/2013   CHOLHDL 2 11/04/2010   Lab Results  Component Value Date   HGBA1C 5.4 05/01/2013     Lab Results  Component Value Date   TSH 3.542 07/06/2011    Hepatic Function Panel  Recent Labs  09/13/15 1636  PROT 7.1  ALBUMIN 4.0  AST 21  ALT 20  ALKPHOS 65  BILITOT 1.1   No results for input(s): CHOL in the last 72 hours. No results for input(s): PROTIME in the last 72 hours.     Studies/Results: Ct Angio  Chest Pe W/cm &/or Wo Cm  09/13/2015  CLINICAL DATA:  75 year old female with shortness of breath and upper back pain. EXAM: CT ANGIOGRAPHY CHEST WITH CONTRAST TECHNIQUE: Multidetector CT imaging of the chest was performed using the standard protocol during bolus administration of intravenous contrast. Multiplanar CT image reconstructions and MIPs were obtained to evaluate the vascular anatomy. CONTRAST:  135mL OMNIPAQUE IOHEXOL 350 MG/ML SOLN COMPARISON:  Chest radiograph dated 09/13/2015 FINDINGS: There is emphysematous changes of the lungs. No focal consolidation, pleural effusion, or pneumothorax. The central airways are patent. There is atherosclerotic calcification of the thoracic aorta. No CT evidence of pulmonary embolism. There is no cardiomegaly or pericardial effusion. There is coronary vascular calcification. There is no hilar or mediastinal adenopathy. The esophagus and the thyroid gland appear unremarkable. There is no axillary adenopathy. The chest wall soft tissues appear unremarkable. There is osteopenia with degenerative changes of the spine. C6 compression fracture with anterior wedging, likely old. There is T8 compression fracture with mild sclerotic changes, age indeterminate, likely old. Clinical correlation is recommended. The visualized upper abdomen appears unremarkable. Review of the MIP images confirms the above findings. IMPRESSION: No CT evidence of pulmonary embolism. Osteopenia with multilevel compression fractures of the thoracic spine, age indeterminate, likely  old. Clinical correlation is recommended. Electronically Signed   By: Anner Crete M.D.   On: 09/13/2015 23:43   Mr Thoracic Spine Wo Contrast  09/14/2015  CLINICAL DATA:  Initial evaluation for acute back pain EXAM: MRI THORACIC SPINE WITHOUT CONTRAST TECHNIQUE: Multiplanar, multisequence MR imaging of the thoracic spine was performed. No intravenous contrast was administered. COMPARISON:  Prior CT from 09/13/2015.  FINDINGS: Limited views of the cervical spine demonstrate multilevel degenerative spondylolysis without severe stenosis. Vertebral bodies are normally aligned with preservation of the normal thoracic kyphosis. Chronic anterior wedging deformity of the T6 vertebral body with without significant retropulsion. There is abnormal T1 hypo intense, T2/STIR hyperintense signal intensity within the T8 vertebral body, consistent with acute compression fracture. Mild 30% of central height loss without bony retropulsion. No other acute or subacute appearing compression fracture identified. No listhesis or malalignment. Signal intensity within the vertebral body bone marrow within normal limits. Small subcentimeter lesion with the T11 vertebral body noted, indeterminate, but may reflect a small hemangioma. Signal intensity within the visualized thoracic cord is normal. Please note that the distal cord is not visualized on this exam. No acute paraspinous soft tissue abnormality. Small layering bilateral pleural effusions noted. Heavy atheromatous plaque within the visualized aorta. Intraluminal fluid noted within the visualized esophagus . T1-2: Mild disc bulge and posterior element hypertrophy. No significant stenosis. T2-3: Mild disc bulge and posterior element hypertrophy. No significant stenosis. T3-4:  Mild broad-based disc bulge without significant stenosis. T4-5:  Minimal disc bulge without stenosis. T5-6:  Negative. T6-7: Mild bony retropulsion related to the chronic T6 vertebral body fracture. Superimposed tiny central disc protrusion. Minimal cord flattening without cord signal changes. Mild posterior element hypertrophy. Very mild canal stenosis. T7-T8:  Negative. T8-9:  Mild posterior element hypertrophy without stenosis. T9-10: Mild posterior element hypertrophy without stenosis. No significant disc bulge. T10-11: Posterior element hypertrophy without stenosis. No significant disc bulge. T11-12: Bilateral facet  arthrosis without significant stenosis. No significant disc bulge. T12-L1: Seen only on sagittal projection. Disc bulge with reactive endplate changes. No significant stenosis. IMPRESSION: 1. Acute compression fracture involving the T8 vertebral body with associated mild 30% height loss without bony retropulsion. 2. No other acute abnormality within the thoracic spine. 3. Chronic compression deformity of T6 with resultant mild canal stenosis. 4. 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 Please see above report for a full description these findings. 5. Small layering bilateral pleural effusions. Electronically Signed   By: Jeannine Boga M.D.   On: 09/14/2015 22:42   Dg Chest Portable 1 View  09/13/2015  CLINICAL DATA:  Shortness of breath, bilateral lower extremity edema EXAM: PORTABLE CHEST 1 VIEW COMPARISON:  06/23/2015 FINDINGS: Increased interstitial markings, chronic. Mild cephalization after. This appearance suggests chronic compensated CHF without frank interstitial edema. No pleural effusion or pneumothorax. The heart is top-normal in size. IMPRESSION: Chronic interstitial markings.  No frank interstitial edema. No definite pleural effusions. Electronically Signed   By: Julian Hy M.D.   On: 09/13/2015 16:34   ECHO: Study Conclusions  - Left ventricle: The cavity size was mildly dilated. Systolic  function was mildly reduced. The estimated ejection fraction was  in the range of 45% to 50%. Diffuse hypokinesis. - Aortic valve: Consider dobutamine study to r/o severe AS given  morphology and low EF There was moderate stenosis. Valve area  (VTI): 0.54 cm^2. Valve area (Vmax): 0.65 cm^2. Valve area  (Vmean): 0.59 cm^2. 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 - Left atrium: The atrium was severely dilated. - Atrial septum: No defect or patent foramen ovale was  identified. - Pulmonary arteries: PA peak pressure: 62 mm Hg (S).  Medications: I have reviewed the patient's current medications. Scheduled Meds: . aspirin  81 mg Oral QODAY  . budesonide (PULMICORT) nebulizer solution  0.25 mg Nebulization BID  . clonazePAM  1 mg Oral QHS  . diltiazem  120 mg Oral QHS  . enoxaparin (LOVENOX) injection  40 mg Subcutaneous Q24H  . furosemide  30 mg Oral Daily  . ipratropium-albuterol  3 mL Nebulization QID  . nicotine  14 mg Transdermal Daily  . sodium chloride flush  3 mL Intravenous Q12H   Continuous Infusions:  PRN Meds:.acetaminophen **OR** acetaminophen, albuterol, methocarbamol, ondansetron **OR** ondansetron (ZOFRAN) IV, traMADol  Assessment/Plan: Principal Problem:   Acute on chronic respiratory failure (HCC) Active Problems:   COPD (chronic obstructive pulmonary disease) (HCC)   COPD exacerbation (HCC)   Mild aortic stenosis   Back pain   Acute respiratory failure ([place])  75 year old female followed by Dr. Harrington Challenger for an echo in the past (2015) with mild LV dysfunction and moderate to severe AS, she underwent R and L heart cath that showed calcified coronary arteries but no significant CAD. And mild AS with mean gradient of 56mmHg, mildly elevated R sided pressures EF at 60%. Also his of racing HR. Wore holter 24 Hr. With short runs of atrial tach, also occ wenkebach. But overall HR was 99. Her dilt po was increased to 120 CD. Also with HTN, COPD.  1. SOB with back pain, possible bronchospasm causing back pain and pain with deep breath neg CT for PE.   2. Moderate AS on echo in 2016 ---Current echo demonstrates severe aortic stenosis with mild left ventricular hypertrophy. BNP is minimally elevated. No evidence of heart failure on chest x-ray.  Will have Pt follow up with Dr. Burt Knack to address the aortic valve.   3. Hx of atrial tach on recent holter, also hx of brady, here on tele she has episodes of PACs and bigeminy non conducted PACs.   -Today SR with PACs  4. Tobacco use on nicoderm patch.       LOS: 2 days   Time spent with pt. :15 minutes. [facility] R  Nurse Practitioner Certified Pager [phone] or after 5pm and on weekends call [phone] 09/15/2015, 9:59 AM   The patient has been seen in conjunction with Cecilie Kicks, NP. All aspects of care have been considered and discussed. The patient has been personally interviewed, examined, and all clinical data has been reviewed.   Back pain etiology now identified.  Will need OP referral to Dr. Burt Knack for valve evaluation and ?TAVR.  Complains of inability to move right normal without pain.  Breathing is stable.

## 2015-09-15 NOTE — Consult Note (Signed)
   [facility] CM Inpatient Consult   09/15/2015  Jill White August 06, 1940 [id number]   Patient screened for [facility] Care Management program. Discussed [facility] Care Management program services at bedside with patient and grandaughter, [name]. Both patient and grandaughter state patient does not need services. Offered automated follow up calls with EMMI and patient shook her head no. Grandaughter states " she does not want none of that, if she gets sick, we will just come back up here". Of note, [name], grandaughter, has declined [facility] Care Management services in the past on patient's behalf. Please see chart review tab then notes for patient outreach to see details from [place]. Will make inpatient RNCM patient declined both [facility] and EMMI calls. Left [facility] Care Management brochure and contact information at bedside for patient.   [name], MSN-Ed, RN,BSN [facility] Liaison [phone]

## 2015-09-15 NOTE — Progress Notes (Signed)
PROGRESS NOTE  Jill White [id number] DOB: 1941/06/06 DOA: 09/13/2015 PCP: Laurey Morale, MD  Assessment/Plan: Acute on chronic respiratory failure - probably worsened because of the upper back pain.  - treat with nebulizer Pulmicort for COPD along with Lasix for aortic stenosis and CHF.  -received Lasix 20 mg IV and continued patient's home dose of 30 mg by mouth daily -BNP elevated >200- defer to cards  Upper back pain -  -CT angiogram of the chest to rule out PE negative but did show compression fx-- family states are old -MRI T spine showed an old and new compression fracture-- patient states she is not interested in kyphoplaty or another procedure-- the robaxin helped -patient is on tramadol for pain as patient cannot tolerate narcotics -offered lidocaine patch but patient declined  Aortic stenosis  -defer to cards  Right shoulder pain -recent steroid injection- on U/S showed severe arthritis and bursitis -xray-- suspect will see severe arthritis  Chronic LBBB. Sensation in upper back pain check cardiac markers.  Chronic sinus tachycardia - has had recent Holter monitor. Continue Cardizem.  Current tobacco abuses -nicotine patch   Code Status: full Family Communication: patient Disposition Plan:    Consultants:  cards  Procedures:      HPI/Subjective: Pain improved in back-- now her main complaint is right shoulder pain  Objective: Filed Vitals:   09/14/15 2124 09/15/15 0507  BP: 131/50 119/54  Pulse: 85 89  Temp: 97.8 F (36.6 C) 98.4 F (36.9 C)  Resp: 23 22    Intake/Output Summary (Last 24 hours) at 09/15/15 1252 Last data filed at 09/14/15 2300  Gross per 24 hour  Intake    670 ml  Output      0 ml  Net    670 ml   Filed Weights   09/13/15 2049 09/14/15 0500 09/15/15 0507  Weight: 50.032 kg (110 lb 4.8 oz) 50.621 kg (111 lb 9.6 oz) 51 kg (112 lb 7 oz)    Exam:   General:  reserved  Cardiovascular: rrr  Respiratory:  diminished, no wheezing  Abdomen: +Bs, soft  Musculoskeletal: min edema   Data Reviewed: Basic Metabolic Panel:  Recent Labs Lab 09/13/15 1636 09/14/15 0457 09/14/15 1000  NA 137 139  --   K 3.5 4.1  --   CL 91* 90*  --   CO2 35* 37*  --   GLUCOSE 102* 84  --   BUN 10 14  --   CREATININE 0.44 0.69  --   CALCIUM 9.3 9.3  --   MG  --   --  1.9   Liver Function Tests:  Recent Labs Lab 09/13/15 1636  AST 21  ALT 20  ALKPHOS 65  BILITOT 1.1  PROT 7.1  ALBUMIN 4.0   No results for input(s): LIPASE, AMYLASE in the last 168 hours. No results for input(s): AMMONIA in the last 168 hours. CBC:  Recent Labs Lab 09/13/15 1636 09/14/15 0457  WBC 10.9* 11.8*  HGB 14.8 14.1  HCT 42.7 44.1  MCV 91.2 96.5  PLT 282 328   Cardiac Enzymes:  Recent Labs Lab 09/13/15 2228 09/14/15 0457 09/14/15 1000  TROPONINI 0.06* 0.03 0.03   BNP (last 3 results)  Recent Labs  09/13/15 1636 09/14/15 1700  BNP 203.8* 281.8*    ProBNP (last 3 results) No results for input(s): PROBNP in the last 8760 hours.  CBG: No results for input(s): GLUCAP in the last 168 hours.  No results found for this or any  previous visit (from the past 240 hour(s)).   Studies: Ct Angio Chest Pe W/cm &/or Wo Cm  09/13/2015  CLINICAL DATA:  75 year old female with shortness of breath and upper back pain. EXAM: CT ANGIOGRAPHY CHEST WITH CONTRAST TECHNIQUE: Multidetector CT imaging of the chest was performed using the standard protocol during bolus administration of intravenous contrast. Multiplanar CT image reconstructions and MIPs were obtained to evaluate the vascular anatomy. CONTRAST:  123mL OMNIPAQUE IOHEXOL 350 MG/ML SOLN COMPARISON:  Chest radiograph dated 09/13/2015 FINDINGS: There is emphysematous changes of the lungs. No focal consolidation, pleural effusion, or pneumothorax. The central airways are patent. There is atherosclerotic calcification of the thoracic aorta. No CT evidence of pulmonary  embolism. There is no cardiomegaly or pericardial effusion. There is coronary vascular calcification. There is no hilar or mediastinal adenopathy. The esophagus and the thyroid gland appear unremarkable. There is no axillary adenopathy. The chest wall soft tissues appear unremarkable. There is osteopenia with degenerative changes of the spine. C6 compression fracture with anterior wedging, likely old. There is T8 compression fracture with mild sclerotic changes, age indeterminate, likely old. Clinical correlation is recommended. The visualized upper abdomen appears unremarkable. Review of the MIP images confirms the above findings. IMPRESSION: No CT evidence of pulmonary embolism. Osteopenia with multilevel compression fractures of the thoracic spine, age indeterminate, likely old. Clinical correlation is recommended. Electronically Signed   By: Anner Crete M.D.   On: 09/13/2015 23:43   Mr Thoracic Spine Wo Contrast  09/14/2015  CLINICAL DATA:  Initial evaluation for acute back pain EXAM: MRI THORACIC SPINE WITHOUT CONTRAST TECHNIQUE: Multiplanar, multisequence MR imaging of the thoracic spine was performed. No intravenous contrast was administered. COMPARISON:  Prior CT from 09/13/2015. FINDINGS: Limited views of the cervical spine demonstrate multilevel degenerative spondylolysis without severe stenosis. Vertebral bodies are normally aligned with preservation of the normal thoracic kyphosis. Chronic anterior wedging deformity of the T6 vertebral body with without significant retropulsion. There is abnormal T1 hypo intense, T2/STIR hyperintense signal intensity within the T8 vertebral body, consistent with acute compression fracture. Mild 30% of central height loss without bony retropulsion. No other acute or subacute appearing compression fracture identified. No listhesis or malalignment. Signal intensity within the vertebral body bone marrow within normal limits. Small subcentimeter lesion with the T11  vertebral body noted, indeterminate, but may reflect a small hemangioma. Signal intensity within the visualized thoracic cord is normal. Please note that the distal cord is not visualized on this exam. No acute paraspinous soft tissue abnormality. Small layering bilateral pleural effusions noted. Heavy atheromatous plaque within the visualized aorta. Intraluminal fluid noted within the visualized esophagus . T1-2: Mild disc bulge and posterior element hypertrophy. No significant stenosis. T2-3: Mild disc bulge and posterior element hypertrophy. No significant stenosis. T3-4:  Mild broad-based disc bulge without significant stenosis. T4-5:  Minimal disc bulge without stenosis. T5-6:  Negative. T6-7: Mild bony retropulsion related to the chronic T6 vertebral body fracture. Superimposed tiny central disc protrusion. Minimal cord flattening without cord signal changes. Mild posterior element hypertrophy. Very mild canal stenosis. T7-T8:  Negative. T8-9:  Mild posterior element hypertrophy without stenosis. T9-10: Mild posterior element hypertrophy without stenosis. No significant disc bulge. T10-11: Posterior element hypertrophy without stenosis. No significant disc bulge. T11-12: Bilateral facet arthrosis without significant stenosis. No significant disc bulge. T12-L1: Seen only on sagittal projection. Disc bulge with reactive endplate changes. No significant stenosis. IMPRESSION: 1. Acute compression fracture involving the T8 vertebral body with associated mild 30% height loss without bony  retropulsion. 2. No other acute abnormality within the thoracic spine. 3. Chronic compression deformity of T6 with resultant mild canal stenosis. 4. 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 Please see above report for a full description these findings. 5. Small layering bilateral pleural effusions. Electronically Signed   By: Jeannine Boga M.D.   On: 09/14/2015 22:42   Dg Chest Portable 1  View  09/13/2015  CLINICAL DATA:  Shortness of breath, bilateral lower extremity edema EXAM: PORTABLE CHEST 1 VIEW COMPARISON:  06/23/2015 FINDINGS: Increased interstitial markings, chronic. Mild cephalization after. This appearance suggests chronic compensated CHF without frank interstitial edema. No pleural effusion or pneumothorax. The heart is top-normal in size. IMPRESSION: Chronic interstitial markings.  No frank interstitial edema. No definite pleural effusions. Electronically Signed   By: Julian Hy M.D.   On: 09/13/2015 16:34    Scheduled Meds: . aspirin  81 mg Oral QODAY  . budesonide (PULMICORT) nebulizer solution  0.25 mg Nebulization BID  . clonazePAM  1 mg Oral QHS  . diltiazem  120 mg Oral QHS  . enoxaparin (LOVENOX) injection  40 mg Subcutaneous Q24H  . furosemide  30 mg Oral Daily  . guaiFENesin  600 mg Oral BID  . ipratropium-albuterol  3 mL Nebulization QID  . nicotine  14 mg Transdermal Daily  . sodium chloride flush  3 mL Intravenous Q12H   Continuous Infusions:  Antibiotics Given (last 72 hours)    None      Principal Problem:   Acute on chronic respiratory failure (HCC) Active Problems:   COPD (chronic obstructive pulmonary disease) (HCC)   COPD exacerbation (HCC)   Mild aortic stenosis   Back pain   Acute respiratory failure ([place])    Time spent: 25 min    [facility] Pager [phone]. If 7PM-7AM, please contact night-coverage at [email or web address], password [facility] 09/15/2015, 12:52 PM  LOS: 2 days

## 2015-09-16 ENCOUNTER — Inpatient Hospital Stay (HOSPITAL_COMMUNITY): Payer: Medicare Other

## 2015-09-16 DIAGNOSIS — J9601 Acute respiratory failure with hypoxia: Secondary | ICD-10-CM

## 2015-09-16 DIAGNOSIS — I350 Nonrheumatic aortic (valve) stenosis: Secondary | ICD-10-CM

## 2015-09-16 DIAGNOSIS — I5042 Chronic combined systolic (congestive) and diastolic (congestive) heart failure: Secondary | ICD-10-CM

## 2015-09-16 DIAGNOSIS — I447 Left bundle-branch block, unspecified: Secondary | ICD-10-CM

## 2015-09-16 DIAGNOSIS — J189 Pneumonia, unspecified organism: Secondary | ICD-10-CM

## 2015-09-16 DIAGNOSIS — J9622 Acute and chronic respiratory failure with hypercapnia: Secondary | ICD-10-CM

## 2015-09-16 DIAGNOSIS — J9621 Acute and chronic respiratory failure with hypoxia: Secondary | ICD-10-CM

## 2015-09-16 DIAGNOSIS — J9602 Acute respiratory failure with hypercapnia: Secondary | ICD-10-CM

## 2015-09-16 DIAGNOSIS — J438 Other emphysema: Secondary | ICD-10-CM

## 2015-09-16 LAB — BLOOD GAS, ARTERIAL
ACID-BASE EXCESS: 10 mmol/L — AB (ref 0.0–2.0)
Bicarbonate: 39 mEq/L — ABNORMAL HIGH (ref 20.0–24.0)
DELIVERY SYSTEMS: POSITIVE
DRAWN BY: 422461
DRAWN BY: 308601
Expiratory PAP: 8
FIO2: 0.5
INSPIRATORY PAP: 12
MODE: POSITIVE
O2 Content: 3 L/min
O2 Saturation: 77.8 %
O2 Saturation: 92.9 %
PATIENT TEMPERATURE: 98
PO2 ART: 50 mmHg — AB (ref 80.0–100.0)
Patient temperature: 98.6
TCO2: 35.9 mmol/L (ref 0–100)
pCO2 arterial: 80.4 mmHg (ref 35.0–45.0)
pH, Arterial: 7.22 — ABNORMAL LOW (ref 7.350–7.450)
pH, Arterial: 7.307 — ABNORMAL LOW (ref 7.350–7.450)
pO2, Arterial: 70.6 mmHg — ABNORMAL LOW (ref 80.0–100.0)

## 2015-09-16 LAB — BASIC METABOLIC PANEL
ANION GAP: 9 (ref 5–15)
BUN: 12 mg/dL (ref 6–20)
CALCIUM: 8.8 mg/dL — AB (ref 8.9–10.3)
CO2: 37 mmol/L — AB (ref 22–32)
CREATININE: 0.37 mg/dL — AB (ref 0.44–1.00)
Chloride: 83 mmol/L — ABNORMAL LOW (ref 101–111)
GLUCOSE: 139 mg/dL — AB (ref 65–99)
Potassium: 4.3 mmol/L (ref 3.5–5.1)
Sodium: 129 mmol/L — ABNORMAL LOW (ref 135–145)

## 2015-09-16 LAB — CBC WITH DIFFERENTIAL/PLATELET
BASOS ABS: 0 10*3/uL (ref 0.0–0.1)
BASOS PCT: 0 %
Eosinophils Absolute: 0 10*3/uL (ref 0.0–0.7)
Eosinophils Relative: 0 %
HEMATOCRIT: 41.6 % (ref 36.0–46.0)
HEMOGLOBIN: 13.6 g/dL (ref 12.0–15.0)
LYMPHS PCT: 8 %
Lymphs Abs: 1.1 10*3/uL (ref 0.7–4.0)
MCH: 31.3 pg (ref 26.0–34.0)
MCHC: 32.7 g/dL (ref 30.0–36.0)
MCV: 95.6 fL (ref 78.0–100.0)
Monocytes Absolute: 0.9 10*3/uL (ref 0.1–1.0)
Monocytes Relative: 7 %
NEUTROS ABS: 12 10*3/uL — AB (ref 1.7–7.7)
NEUTROS PCT: 85 %
Platelets: 279 10*3/uL (ref 150–400)
RBC: 4.35 MIL/uL (ref 3.87–5.11)
RDW: 14.2 % (ref 11.5–15.5)
WBC: 14.1 10*3/uL — AB (ref 4.0–10.5)

## 2015-09-16 LAB — CBC
HEMATOCRIT: 41.7 % (ref 36.0–46.0)
HEMOGLOBIN: 13.5 g/dL (ref 12.0–15.0)
MCH: 31 pg (ref 26.0–34.0)
MCHC: 32.4 g/dL (ref 30.0–36.0)
MCV: 95.6 fL (ref 78.0–100.0)
Platelets: 283 10*3/uL (ref 150–400)
RBC: 4.36 MIL/uL (ref 3.87–5.11)
RDW: 14.3 % (ref 11.5–15.5)
WBC: 14.2 10*3/uL — AB (ref 4.0–10.5)

## 2015-09-16 LAB — LACTIC ACID, PLASMA: LACTIC ACID, VENOUS: 0.6 mmol/L (ref 0.5–2.0)

## 2015-09-16 LAB — MRSA PCR SCREENING: MRSA by PCR: NEGATIVE

## 2015-09-16 LAB — BRAIN NATRIURETIC PEPTIDE: B NATRIURETIC PEPTIDE 5: 629.6 pg/mL — AB (ref 0.0–100.0)

## 2015-09-16 LAB — PROCALCITONIN: Procalcitonin: <0.1 ng/mL

## 2015-09-16 MED ORDER — METHYLPREDNISOLONE SODIUM SUCC 125 MG IJ SOLR
50.0000 mg | Freq: Four times a day (QID) | INTRAMUSCULAR | Status: DC
  Administered 2015-09-16 – 2015-09-18 (×9): 50 mg via INTRAVENOUS
  Filled 2015-09-16 (×9): qty 2

## 2015-09-16 MED ORDER — FUROSEMIDE 20 MG PO TABS: 30.0000 mg | ORAL_TABLET | Freq: Every day | ORAL | Status: DC

## 2015-09-16 MED ORDER — DEXTROSE 5 % IV SOLN
2.0000 g | Freq: Every day | INTRAVENOUS | Status: DC
  Administered 2015-09-16 – 2015-09-18 (×3): 2 g via INTRAVENOUS
  Filled 2015-09-16 (×3): qty 2

## 2015-09-16 MED ORDER — FUROSEMIDE 10 MG/ML IJ SOLN
20.0000 mg | Freq: Once | INTRAMUSCULAR | Status: AC
  Administered 2015-09-16: 20 mg via INTRAVENOUS
  Filled 2015-09-16: qty 2

## 2015-09-16 MED ORDER — FUROSEMIDE 10 MG/ML IJ SOLN
40.0000 mg | Freq: Once | INTRAMUSCULAR | Status: AC
  Administered 2015-09-16: 40 mg via INTRAVENOUS
  Filled 2015-09-16: qty 4

## 2015-09-16 MED ORDER — VANCOMYCIN HCL 500 MG IV SOLR
500.0000 mg | Freq: Two times a day (BID) | INTRAVENOUS | Status: DC
  Administered 2015-09-16 – 2015-09-18 (×5): 500 mg via INTRAVENOUS
  Filled 2015-09-16 (×5): qty 500

## 2015-09-16 NOTE — Progress Notes (Signed)
Rt placed pt back ob BIPAP due to pt looking very sleepy. No distress noted at this time. Family at bedside.

## 2015-09-16 NOTE — Progress Notes (Signed)
Pt remains on BIPAP at this time. 

## 2015-09-16 NOTE — Progress Notes (Signed)
Rapid Response Event Note  Overview:Called to room 1444 by bedside RN for COPD pt lethargic and difficult to arouse. Unable to obtain O2 sat .      Initial Focused Assessment: ATF pt. Semi- fowlers in bed somnolent, skin pale , grayish, warm and moist. Feet  Increased WOB with little air movement auscultated. Called for STAT port CXR , ABG,and albuterol treatment. The bedside nurse had  Also called Mid-level Baltazar Najjar. Initial VS : 152/80(103), RR 24, HR 133 ST, O2 Sats-84% on 4 liters [place].  Interventions: STAT port CXR, ABG, Albuterol treatment  Event Summary:   at   0430- BP- 148/120,HR-129, RR 26, O2 sats 86% , Albuterol Tx given, ABG: Ph 7.22, CO2 >108 and O2 50    at  0445-Pt. Transferred to 1231 and placed on BiPAP        Jill White, Juliann Pulse A

## 2015-09-16 NOTE — Progress Notes (Signed)
PROGRESS NOTE  Jill White [id number] DOB: May 09, 1941 DOA: 09/13/2015 PCP: Laurey Morale, MD  Brief History 75 year old female with history of hypertension, COPD, moderate aortic stenosis, tachycardia presented with increasing shortness of breath after developing new onset upper back pain. There was no history of trauma or injury. Pain in the upper back appeared to be worsening with deep inspiration and movement. The pain resulted in increasing shortness of breath. Initial chest x-ray was negative for acute findings. On the early morning of 09/16/2015, the patient developed respiratory distress and was placed on BiPAP. Assessment/Plan: Acute on chronic respiratory failure with hypoxia and hypercarbia -Secondary to HAP and CHF in the setting of severe aortic stenosis and COPD -pt had component of fluid overload with edema on CXR and peripheral edema-->furosemide per cardiology -Continue BiPAP -09/16/15--consulted pulmonary today -continue pulmicort and Duonebs -granddaughter appears to have unrealistic expectations  HAP -vanco and cefepime started 3/29 -procalcitonin -lactic acid 0.6  T6 and T8 compression fracture -09/13/2015 CT angio chest negative for pulmonary embolus -09/14/2015 MRI thoracic spine acute T8 compression fracture -continue tramadol for pain  Moderate to severe aortic stenosis -Appreciate cardiology follow-up -ultimately need evaluation for TAVR  Right shoulder pain -Recent steroid injection 09/03/2015 -Ultrasound of the shoulder reveals subacromial bursitis, 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 -Symptomatically treatment  Tachycardia -Recent Holter monitor reveals brief runs of atrial tachycardia, sinus rhythm with Wenckebach AV block -Patient has chronic LBBB  Tobacco abuse -Cessation discussed    Family Communication:   Grand daughter updated at beside Disposition Plan:   Remain in step down, not medically stable Total time 40 min.  > 50%  spent counseling and coordinating care       Procedures/Studies: Dg Shoulder Right  09/15/2015  CLINICAL DATA:  Chronic right shoulder pain.  No known injury. EXAM: RIGHT SHOULDER - 2+ VIEW COMPARISON:  Chest CT 09/13/2015. FINDINGS: Moderate AC joint degenerative changes. Mild glenohumeral joint degenerative changes. No acute bony abnormality or worrisome bone lesion. The visualize right ribs are intact. The visualize right lung is clear. Stable emphysematous changes and pulmonary scarring. IMPRESSION: AC joint and glenohumeral joint degenerative changes but no acute bony findings. Electronically Signed   By: Marijo Sanes M.D.   On: 09/15/2015 14:12   Ct Angio Chest Pe W/cm &/or Wo Cm  09/13/2015  CLINICAL DATA:  75 year old female with shortness of breath and upper back pain. EXAM: CT ANGIOGRAPHY CHEST WITH CONTRAST TECHNIQUE: Multidetector CT imaging of the chest was performed using the standard protocol during bolus administration of intravenous contrast. Multiplanar CT image reconstructions and MIPs were obtained to evaluate the vascular anatomy. CONTRAST:  129mL OMNIPAQUE IOHEXOL 350 MG/ML SOLN COMPARISON:  Chest radiograph dated 09/13/2015 FINDINGS: There is emphysematous changes of the lungs. No focal consolidation, pleural effusion, or pneumothorax. The central airways are patent. There is atherosclerotic calcification of the thoracic aorta. No CT evidence of pulmonary embolism. There is no cardiomegaly or pericardial effusion. There is coronary vascular calcification. There is no hilar or mediastinal adenopathy. The esophagus and the thyroid gland appear unremarkable. There is no axillary adenopathy. The chest wall soft tissues appear unremarkable. There is osteopenia with degenerative changes of the spine. C6 compression fracture with anterior wedging, likely old. There is T8 compression fracture with mild sclerotic changes, age indeterminate, likely old. Clinical correlation is recommended.  The visualized upper abdomen appears unremarkable. Review of the MIP images confirms the above findings. IMPRESSION: No CT evidence of pulmonary embolism.  Osteopenia with multilevel compression fractures of the thoracic spine, age indeterminate, likely old. Clinical correlation is recommended. Electronically Signed   By: Anner Crete M.D.   On: 09/13/2015 23:43   Mr Thoracic Spine Wo Contrast  09/14/2015  CLINICAL DATA:  Initial evaluation for acute back pain EXAM: MRI THORACIC SPINE WITHOUT CONTRAST TECHNIQUE: Multiplanar, multisequence MR imaging of the thoracic spine was performed. No intravenous contrast was administered. COMPARISON:  Prior CT from 09/13/2015. FINDINGS: Limited views of the cervical spine demonstrate multilevel degenerative spondylolysis without severe stenosis. Vertebral bodies are normally aligned with preservation of the normal thoracic kyphosis. Chronic anterior wedging deformity of the T6 vertebral body with without significant retropulsion. There is abnormal T1 hypo intense, T2/STIR hyperintense signal intensity within the T8 vertebral body, consistent with acute compression fracture. Mild 30% of central height loss without bony retropulsion. No other acute or subacute appearing compression fracture identified. No listhesis or malalignment. Signal intensity within the vertebral body bone marrow within normal limits. Small subcentimeter lesion with the T11 vertebral body noted, indeterminate, but may reflect a small hemangioma. Signal intensity within the visualized thoracic cord is normal. Please note that the distal cord is not visualized on this exam. No acute paraspinous soft tissue abnormality. Small layering bilateral pleural effusions noted. Heavy atheromatous plaque within the visualized aorta. Intraluminal fluid noted within the visualized esophagus . T1-2: Mild disc bulge and posterior element hypertrophy. No significant stenosis. T2-3: Mild disc bulge and posterior element  hypertrophy. No significant stenosis. T3-4:  Mild broad-based disc bulge without significant stenosis. T4-5:  Minimal disc bulge without stenosis. T5-6:  Negative. T6-7: Mild bony retropulsion related to the chronic T6 vertebral body fracture. Superimposed tiny central disc protrusion. Minimal cord flattening without cord signal changes. Mild posterior element hypertrophy. Very mild canal stenosis. T7-T8:  Negative. T8-9:  Mild posterior element hypertrophy without stenosis. T9-10: Mild posterior element hypertrophy without stenosis. No significant disc bulge. T10-11: Posterior element hypertrophy without stenosis. No significant disc bulge. T11-12: Bilateral facet arthrosis without significant stenosis. No significant disc bulge. T12-L1: Seen only on sagittal projection. Disc bulge with reactive endplate changes. No significant stenosis. IMPRESSION: 1. Acute compression fracture involving the T8 vertebral body with associated mild 30% height loss without bony retropulsion. 2. No other acute abnormality within the thoracic spine. 3. Chronic compression deformity of T6 with resultant mild canal stenosis. 4. 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 Please see above report for a full description these findings. 5. Small layering bilateral pleural effusions. Electronically Signed   By: Jeannine Boga M.D.   On: 09/14/2015 22:42   [place] Extrem Up Right Ltd  09/07/2015  [facility] US performed of: Right This study was ordered, performed, and interpreted by Charlann Boxer D.O. Shoulder:  Supraspinatus: Degenerative changes noted but no true acute tear. Patient does have bursal bulge. Underlying arthritic changes noted Infraspinatus: Appears normal on long and transverse views. Significant increase in Doppler flow Subscapularis: Degenerative changes noted. Positive bursa Teres Minor: Appears normal on long and transverse views. AC joint: Moderate arthritis Glenohumeral Joint: Moderate  arthritis. Glenoid Labrum: Intact without visualized tears. Biceps Tendon: Appears normal on long and transverse views, no fraying of tendon, tendon located in intertubercular groove, no subluxation with shoulder internal or external rotation. Impression: Subacromial bursitis, moderate to severe underlying arthritic changes. Procedure: Real-time Ultrasound Guided Injection of right glenohumeral joint Device: GE Logiq E  Ultrasound guided injection is preferred based studies that show increased duration, increased effect, greater accuracy, decreased procedural pain, increased response  rate with ultrasound guided versus blind injection.  Verbal informed consent obtained.  Time-out conducted.  Noted no overlying erythema, induration, or other signs of local infection.  Skin prepped in a sterile fashion.  Local anesthesia: Topical Ethyl chloride.  With sterile technique and under real time ultrasound guidance: Joint visualized. 23g 1  inch needle inserted posterior approach. Pictures taken for needle placement. Patient did have injection of 2 cc of 1% lidocaine, 2 cc of 0.5% Marcaine, and 1.0 cc of Kenalog 40 mg/dL. Completed without difficulty  Pain immediately resolved suggesting accurate placement of the medication.  Advised to call if fevers/chills, erythema, induration, drainage, or persistent bleeding.  Images permanently stored and available for review in the ultrasound unit.  Impression: Technically successful ultrasound guided injection.  Dg Chest Port 1 View  09/16/2015  CLINICAL DATA:  Acute onset of shortness of breath and decreased O2 saturation. Initial encounter. EXAM: PORTABLE CHEST 1 VIEW COMPARISON:  Chest radiograph and CTA of the chest performed 09/13/2015 FINDINGS: New left basilar airspace opacity is concerning for pneumonia. Increased vascular congestion is noted, and superimposed interstitial edema cannot be excluded. No definite pleural effusion or pneumothorax is seen. The  cardiomediastinal silhouette is borderline enlarged. No acute osseous abnormalities are identified. IMPRESSION: New left basilar airspace opacity is concerning for pneumonia. Increased vascular congestion noted, with borderline cardiomegaly. Superimposed interstitial edema cannot be excluded. Electronically Signed   By: Garald Balding M.D.   On: 09/16/2015 04:44   Dg Chest Portable 1 View  09/13/2015  CLINICAL DATA:  Shortness of breath, bilateral lower extremity edema EXAM: PORTABLE CHEST 1 VIEW COMPARISON:  06/23/2015 FINDINGS: Increased interstitial markings, chronic. Mild cephalization after. This appearance suggests chronic compensated CHF without frank interstitial edema. No pleural effusion or pneumothorax. The heart is top-normal in size. IMPRESSION: Chronic interstitial markings.  No frank interstitial edema. No definite pleural effusions. Electronically Signed   By: Julian Hy M.D.   On: 09/13/2015 16:34         Subjective: Pt awakens on exam.  Follows one step commands.  Denieschest pain, respiratory distress,, pain. Patient remains on BiPAP  Objective: Filed Vitals:   09/15/15 1400 09/15/15 2204 09/16/15 0415 09/16/15 0508  BP: 129/65 137/61 145/53   Pulse: 88 94 92 121  Temp: 98.8 F (37.1 C) 98 F (36.7 C) 98 F (36.7 C)   TempSrc: Oral Oral Oral   Resp: 20 20 24 19   Height:      Weight:      SpO2: 95% 92% 78% 90%    Intake/Output Summary (Last 24 hours) at 09/16/15 [id number] Last data filed at 09/15/15 2204  Gross per 24 hour  Intake    120 ml  Output    550 ml  Net   -430 ml   Weight change:  Exam:   General:  Pt is alert, follows commands appropriately, not in acute distress  HEENT: No icterus, No thrush, No neck mass, [place]/AT  Cardiovascular: RRR, S1/S2, no rubs, no gallops  Respiratory: diminished breath sounds bilaterally with bibasilar rales. No wheezing. Good air movement.  Abdomen: Soft/+BS, non tender, non distended, no guarding  Extremities:  No edema, No lymphangitis, No petechiae, No rashes, no synovitis  Data Reviewed: Basic Metabolic Panel:  Recent Labs Lab 09/13/15 1636 09/14/15 0457 09/14/15 1000 09/16/15 0434  NA 137 139  --  129*  K 3.5 4.1  --  4.3  CL 91* 90*  --  83*  CO2 35* 37*  --  37*  GLUCOSE 102* 84  --  139*  BUN 10 14  --  12  CREATININE 0.44 0.69  --  0.37*  CALCIUM 9.3 9.3  --  8.8*  MG  --   --  1.9  --    Liver Function Tests:  Recent Labs Lab 09/13/15 1636  AST 21  ALT 20  ALKPHOS 65  BILITOT 1.1  PROT 7.1  ALBUMIN 4.0   No results for input(s): LIPASE, AMYLASE in the last 168 hours. No results for input(s): AMMONIA in the last 168 hours. CBC:  Recent Labs Lab 09/13/15 1636 09/14/15 0457 09/16/15 0433 09/16/15 0434  WBC 10.9* 11.8* 14.1* 14.2*  NEUTROABS  --   --  12.0*  --   HGB 14.8 14.1 13.6 13.5  HCT 42.7 44.1 41.6 41.7  MCV 91.2 96.5 95.6 95.6  PLT 282 328 279 283   Cardiac Enzymes:  Recent Labs Lab 09/13/15 2228 09/14/15 0457 09/14/15 1000  TROPONINI 0.06* 0.03 0.03   BNP: Invalid input(s): POCBNP CBG: No results for input(s): GLUCAP in the last 168 hours.  No results found for this or any previous visit (from the past 240 hour(s)).   Scheduled Meds: . aspirin  81 mg Oral QODAY  . budesonide (PULMICORT) nebulizer solution  0.25 mg Nebulization BID  . ceFEPime (MAXIPIME) IV  2 g Intravenous Q0600  . clonazePAM  1 mg Oral QHS  . diltiazem  120 mg Oral QHS  . enoxaparin (LOVENOX) injection  40 mg Subcutaneous Q24H  . furosemide  30 mg Oral Daily  . guaiFENesin  600 mg Oral BID  . ipratropium-albuterol  3 mL Nebulization QID  . nicotine  14 mg Transdermal Daily  . sodium chloride flush  3 mL Intravenous Q12H  . vancomycin  500 mg Intravenous Q12H   Continuous Infusions:    Jearldean Gutt, DO  [facility] Pager [phone]  If 7PM-7AM, please contact night-coverage [email or web address] Password [email or web address] 09/16/2015, 7:02 AM   LOS: 3 days

## 2015-09-16 NOTE — Progress Notes (Addendum)
Pt remains on BIPAP/ NIV due to am ABG.

## 2015-09-16 NOTE — Progress Notes (Signed)
Pt sleeping heart rate deacreased to 47 SB -78 over few minutes.O2 sat decreased to 88. Dr Tat notifed .Ekg done and results called back to him.

## 2015-09-16 NOTE — Progress Notes (Signed)
Patient found to be on respiratory distress. Oxygen saturation dropped to 70's, lungs sounds tight, crackles, HR 130-140's, sinus rhythm with PACs. Patient is verbally responsive and very lethargic.Rapid response came and evaluated patient. Tylene Fantasia NP was made aware.

## 2015-09-16 NOTE — Progress Notes (Addendum)
NP called by RN because pt was desatting into the 80s on the floor. Rapid response was there. ABG showed unreadable PCO2, low PO2 and pH 7.22. CXR with new PNA LLL and pulmonary vascular congestion. Pt transferred urgently to SDU for Bipap. Lasix 20mg  IV x 1. CBC with increased WBCC, add diff. Pro BNP, BMP, LA pending.  BP 130s. HR 120s.  Pt is easily arousable on the Bipap. Feels less SOB now.  1. Acute on chronic respiratory failure secondary to COPD, CHF, and new PNA (HAP). Bipap with r/p ABG in an hour. Low threshold to intubate. Start Vanc/Cefepime. NPO for now.  2. CHF with pulmonary vascular congestion-Lasix IV x 1 now. Foley. I&O. R/p pro BNP.  3. Chronic ST-pt on Cardizem, watch rate. Cardio involved. May need IV meds to control HR while NPO.  4. Tobacco abuse. Nicoderm.  Awaiting further labs. Daughter Jill White updated at bedside. Daughter states she is HCPOA. Pt is FULL CODE.  Dr. Chase Caller is pt's pulmonologist. Will get them involved tonight as needed and have them follow in am.  Jill Boll, NP [facility] Update: 2nd ABG much improved. Tolerating bipap well. Continue Bipap. LA normal. BMP OK except Na 129. BNP pending. HR 1 teens to 120s. BBB is chronic. BP stable. Report to oncoming attending at 0700. KJKG, NP [facility]

## 2015-09-16 NOTE — Progress Notes (Signed)
Pt currently on 10L [place] and tolerating well at this time.  Pt alert and oriented, BIPAP not needed at this time.  Pt will resume BIPAP at HS.  RT to monitor and assess as needed.

## 2015-09-16 NOTE — Progress Notes (Addendum)
Patient Name: Jill White Date of Encounter: 09/16/2015  Principal Problem:   Acute on chronic respiratory failure ([place]) Active Problems:   COPD (chronic obstructive pulmonary disease) (HCC)   COPD exacerbation (HCC)   Mild aortic stenosis   Back pain   Acute respiratory failure (HCC)   Chronic obstructive pulmonary disease (HCC)   SOB (shortness of breath)   Primary Cardiologist: Dr Harrington Challenger Patient Profile: 75 yo female w/ hx mild AS (mean grad 7), no sig CAD at cath 2015, EF 60%, atrial tach, occ Wenkebach, on [place], HTN, COPD. Admitted 03/26 w/ SOB, COPD exacerbation>>PNA w/ resp failure req BiPAP 03/28 pm, AS now severe by echo. F/u [facility].    SUBJECTIVE: Breathing better than yesterday.   Granddaughter in room, upset that this was not taken care of as OP, feels pt was having problems with fluid for several weeks PTA, phone calls in system deal with elevated HR and edema.  OBJECTIVE Filed Vitals:   09/15/15 1400 09/15/15 2204 09/16/15 0415 09/16/15 0508  BP: 129/65 137/61 145/53   Pulse: 88 94 92 121  Temp: 98.8 F (37.1 C) 98 F (36.7 C) 98 F (36.7 C)   TempSrc: Oral Oral Oral   Resp: 20 20 24 19   Height:      Weight:      SpO2: 95% 92% 78% 90%    Intake/Output Summary (Last 24 hours) at 09/16/15 0736 Last data filed at 09/15/15 2204  Gross per 24 hour  Intake    120 ml  Output    550 ml  Net   -430 ml   Filed Weights   09/13/15 2049 09/14/15 0500 09/15/15 0507  Weight: 110 lb 4.8 oz (50.032 kg) 111 lb 9.6 oz (50.621 kg) 112 lb 7 oz (51 kg)    PHYSICAL EXAM General: Well developed, well nourished, female in no acute distress. Head: Normocephalic, atraumatic.  Neck: Supple without bruits, JVD elevated, but has BiPAP mask on. Lungs:  Resp regular and unlabored, bibasilar rales Heart: RRR, S1, S2, no S3, S4, 2-3/6 murmur; no rub. Abdomen: Soft, non-tender, non-distended, BS + x 4.  Extremities: No clubbing, cyanosis, edema.  Neuro: Alert and oriented X  3. Moves all extremities spontaneously. Psych: Normal affect.  LABS: CBC: Recent Labs  09/16/15 0433 09/16/15 0434  WBC 14.1* 14.2*  NEUTROABS 12.0*  --   HGB 13.6 13.5  HCT 41.6 41.7  MCV 95.6 95.6  PLT 279 [id number]   Basic Metabolic Panel: Recent Labs  09/14/15 0457 09/14/15 1000 09/16/15 0434  NA 139  --  129*  K 4.1  --  4.3  CL 90*  --  83*  CO2 37*  --  37*  GLUCOSE 84  --  139*  BUN 14  --  12  CREATININE 0.69  --  0.37*  CALCIUM 9.3  --  8.8*  MG  --  1.9  --    Liver Function Tests: Recent Labs  09/13/15 1636  AST 21  ALT 20  ALKPHOS 65  BILITOT 1.1  PROT 7.1  ALBUMIN 4.0   Cardiac Enzymes: Recent Labs  09/13/15 2228 09/14/15 0457 09/14/15 1000  TROPONINI 0.06* 0.03 0.03    Recent Labs  09/13/15 1644  TROPIPOC 0.02   BNP:  B NATRIURETIC PEPTIDE  Date/Time Value Ref Range Status  09/16/2015 04:33 AM 629.6* 0.0 - 100.0 pg/mL Final  09/14/2015 05:00 PM 281.8* 0.0 - 100.0 pg/mL Final   TELE:   ST, PACs &  PVCs      Radiology/Studies: Dg Shoulder Right 09/15/2015  CLINICAL DATA:  Chronic right shoulder pain.  No known injury. EXAM: RIGHT SHOULDER - 2+ VIEW COMPARISON:  Chest CT 09/13/2015. FINDINGS: Moderate AC joint degenerative changes. Mild glenohumeral joint degenerative changes. No acute bony abnormality or worrisome bone lesion. The visualize right ribs are intact. The visualize right lung is clear. Stable emphysematous changes and pulmonary scarring. IMPRESSION: AC joint and glenohumeral joint degenerative changes but no acute bony findings. Electronically Signed   By: Marijo Sanes M.D.   On: 09/15/2015 14:12   Mr Thoracic Spine Wo Contrast 09/14/2015  CLINICAL DATA:  Initial evaluation for acute back pain EXAM: MRI THORACIC SPINE WITHOUT CONTRAST TECHNIQUE: Multiplanar, multisequence MR imaging of the thoracic spine was performed. No intravenous contrast was administered. COMPARISON:  Prior CT from 09/13/2015. FINDINGS: Limited views of the  cervical spine demonstrate multilevel degenerative spondylolysis without severe stenosis. Vertebral bodies are normally aligned with preservation of the normal thoracic kyphosis. Chronic anterior wedging deformity of the T6 vertebral body with without significant retropulsion. There is abnormal T1 hypo intense, T2/STIR hyperintense signal intensity within the T8 vertebral body, consistent with acute compression fracture. Mild 30% of central height loss without bony retropulsion. No other acute or subacute appearing compression fracture identified. No listhesis or malalignment. Signal intensity within the vertebral body bone marrow within normal limits. Small subcentimeter lesion with the T11 vertebral body noted, indeterminate, but may reflect a small hemangioma. Signal intensity within the visualized thoracic cord is normal. Please note that the distal cord is not visualized on this exam. No acute paraspinous soft tissue abnormality. Small layering bilateral pleural effusions noted. Heavy atheromatous plaque within the visualized aorta. Intraluminal fluid noted within the visualized esophagus . T1-2: Mild disc bulge and posterior element hypertrophy. No significant stenosis. T2-3: Mild disc bulge and posterior element hypertrophy. No significant stenosis. T3-4:  Mild broad-based disc bulge without significant stenosis. T4-5:  Minimal disc bulge without stenosis. T5-6:  Negative. T6-7: Mild bony retropulsion related to the chronic T6 vertebral body fracture. Superimposed tiny central disc protrusion. Minimal cord flattening without cord signal changes. Mild posterior element hypertrophy. Very mild canal stenosis. T7-T8:  Negative. T8-9:  Mild posterior element hypertrophy without stenosis. T9-10: Mild posterior element hypertrophy without stenosis. No significant disc bulge. T10-11: Posterior element hypertrophy without stenosis. No significant disc bulge. T11-12: Bilateral facet arthrosis without significant  stenosis. No significant disc bulge. T12-L1: Seen only on sagittal projection. Disc bulge with reactive endplate changes. No significant stenosis. IMPRESSION: 1. Acute compression fracture involving the T8 vertebral body with associated mild 30% height loss without bony retropulsion. 2. No other acute abnormality within the thoracic spine. 3. Chronic compression deformity of T6 with resultant mild canal stenosis. 4. 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 Please see above report for a full description these findings. 5. Small layering bilateral pleural effusions. Electronically Signed   By: Jeannine Boga M.D.   On: 09/14/2015 22:42   Dg Chest Port 1 View 09/16/2015  CLINICAL DATA:  Acute onset of shortness of breath and decreased O2 saturation. Initial encounter. EXAM: PORTABLE CHEST 1 VIEW COMPARISON:  Chest radiograph and CTA of the chest performed 09/13/2015 FINDINGS: New left basilar airspace opacity is concerning for pneumonia. Increased vascular congestion is noted, and superimposed interstitial edema cannot be excluded. No definite pleural effusion or pneumothorax is seen. The cardiomediastinal silhouette is borderline enlarged. No acute osseous abnormalities are identified. IMPRESSION: New left basilar airspace opacity is concerning for  pneumonia. Increased vascular congestion noted, with borderline cardiomegaly. Superimposed interstitial edema cannot be excluded. Electronically Signed   By: Garald Balding M.D.   On: 09/16/2015 04:44     Current Medications:  . aspirin  81 mg Oral QODAY  . budesonide (PULMICORT) nebulizer solution  0.25 mg Nebulization BID  . ceFEPime (MAXIPIME) IV  2 g Intravenous Q0600  . clonazePAM  1 mg Oral QHS  . diltiazem  120 mg Oral QHS  . enoxaparin (LOVENOX) injection  40 mg Subcutaneous Q24H  . furosemide  30 mg Oral Daily  . guaiFENesin  600 mg Oral BID  . ipratropium-albuterol  3 mL Nebulization QID  . nicotine  14 mg  Transdermal Daily  . sodium chloride flush  3 mL Intravenous Q12H  . vancomycin  500 mg Intravenous Q12H      ASSESSMENT AND PLAN: Principal Problem: 1.  Acute on chronic respiratory failure (HCC) - HAP, COPD, CHF - HAP &  COPD per IM, +/- CCM - component of CHF with elevated BNP and possible superimposed edema on CXR - will give IV Lasix 40 mg today, restart po rx in am if improved. - follow BMET, I/O, weights  2. Moderate AS on echo in 2016 ---Current echo demonstrates severe aortic stenosis with mild left ventricular hypertrophy. BNP is elevated. Will have Pt follow up with Dr. Burt Knack to address the aortic valve.   3. Hx of atrial tach on recent holter, also hx of brady, here on tele she has episodes of PACs and bigeminy non conducted PACs.  -Today SR with PACs and PVCs  4. Tobacco use on nicoderm patch.  Otherwise, per IM  Active Problems:   COPD (chronic obstructive pulmonary disease) (HCC)   COPD exacerbation (HCC)   Mild aortic stenosis   Back pain   Acute respiratory failure (HCC)   Chronic obstructive pulmonary disease (HCC)   SOB (shortness of breath)   Signed, Barrett, [address] 7:36 AM 09/16/2015 The patient has been seen in conjunction with Rosaria Ferries, PA-C. All aspects of care have been considered and discussed. The patient has been personally interviewed, examined, and all clinical data has been reviewed.   The patient has been difficult to manage because of various complaints that are misleading. Her major concerns have been back pain and right shoulder pain. He has been sedated and given analgesic therapy.  She has severe aortic stenosis with low normal left ventricular systolic function.  She appears to have severe COPD with CO2 retention and now superimposed pneumonia.  We should diurese her as tolerated by blood pressure keeping in mind that aortic stenosis is severe. It would take very little CHF on top of her lung disease and pneumonia to  cause respiratory decompensation. Her intake and output has been positive since admission.  Significant discussion with her daughter. Attempted to goals of care. The daughter is definite in the idea that she is a full code and should be intubated if necessary. We discussed the aortic valve disease and the significant workup that would need to be done. She is not in florid pulmonary edema and therefore urgent/emergent workup is not indicated. She has pneumonia and her overall condition is extremely guarded. I made this known to the patient's daughter who has significant denial concerning her mother's overall condition.

## 2015-09-16 NOTE — Progress Notes (Signed)
Pharmacy Antibiotic Note  Jill White is a 75 y.o. female admitted on 09/13/2015 with pneumonia.  Pharmacy has been consulted for Vancomycin, cefepime dosing.  Plan: Vancomycin 500mg  IV every 12 hours.  Goal trough 15-20 mcg/mL.  Cefepime 2gm iv q24hr  Height: 5\' 3"  (160 cm) Weight: 112 lb 7 oz (51 kg) IBW/kg (Calculated) : 52.4  Temp (24hrs), Avg:98.3 F (36.8 C), Min:98 F (36.7 C), Max:98.8 F (37.1 C)   Recent Labs Lab 09/13/15 1636 09/14/15 0457 09/16/15 0434  WBC 10.9* 11.8* 14.2*  CREATININE 0.44 0.69  --     Estimated Creatinine Clearance: 48.2 mL/min (by C-G formula based on Cr of 0.69).    Allergies  Allergen Reactions  . Tetanus Toxoid Anaphylaxis  . Penicillins Hives    Has patient had a PCN reaction causing immediate rash, facial/tongue/throat swelling, SOB or lightheadedness with hypotension: unknown  Has patient had a PCN reaction causing severe rash involving mucus membranes or skin necrosis: No  Has patient had a PCN reaction that required hospitalization: No  Has patient had a PCN reaction occurring within the last 10 years: No  If all of the above answers are "NO", then may proceed with Cephalosporin use.   . Codeine Nausea And Vomiting and Other (See Comments)    Itching and faint   . Erythromycin Nausea And Vomiting  . Gabapentin     REACTION: severe edema, nausea, dizziness, disorientation  . Levaquin [Levofloxacin In D5w] Other (See Comments)    Joint pain; (states she can take it IV- not by mouth)  . Meloxicam Itching  . Methylprednisolone Hives  . Prednisone Other (See Comments)    GI bleed    Antimicrobials this admission: Vancomycin 3/29 >>  Cefepime 3/29 >>   Microbiology results: pending  Thank you for allowing pharmacy to be a part of this patient's care.  [name] 09/16/2015 5:12 AM

## 2015-09-16 NOTE — Progress Notes (Addendum)
Rt took pt off BIPAP and placed on 15 LPM HF. RT will decrease O2 as needed. NP at bedside.

## 2015-09-16 NOTE — Consult Note (Signed)
PULMONARY / CRITICAL CARE MEDICINE   Name: Jill White MRN: [id number] DOB: September 05, 1940    ADMISSION DATE:  09/13/2015 CONSULTATION DATE:  3/29  REFERRING MD: TAT  CHIEF COMPLAINT:    HISTORY OF PRESENT ILLNESS:   75 y.o. female with history of severe COPD, moderate aortic stenosis, tachycardia presents to the ER on 3/26 w cc: back pain and dyspnea.  Denied any fall or trauma. Pain is mostly in the upper mid back increases on deep inspiration and movement. This led to patient becoming more short of breath. 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 Also were evaluating back pain.  Patient also recently noticed increasing swelling of her lower extremity for which patient started increasing her Lasix dose from 30 mg daily to 40 mg daily. Patient's Cardizem dose was increased a change from 30 mg 4 times daily to 120 mg at bedtime. Was being treated w/ Ultram for pain d/t h/o narcotic sensitivity. On 3/29 Rapid response was called as patient became hypoxic, and hypercarbic, was also tachycardiac, and obtunded. Was transferred to [facility] unit given Lasix and placed on BIPAP. PCCM asked to consult.   PAST MEDICAL HISTORY :  She  has a past medical history of HIP PAIN; HYPERTENSION; Restless leg syndrome; COPD (chronic obstructive pulmonary disease) ([place]); Peptic ulcer disease; Hyponatremia; Tobacco abuse; Hypomagnesemia; LBBB (left bundle branch block); Pulmonary HTN ([place]); Mitral regurgitation; Respiratory failure ([place]); Abnormal echocardiogram; Aortic stenosis; Pulmonary HTN ([place]); Wide-complex tachycardia ([place]); Chronic diastolic CHF (congestive heart failure) ([place]); Diverticulitis of colon (without mention of hemorrhage) (08/09/2013); Septic shock ([place]) (12/11/2013); and Acute on chronic combined systolic and diastolic congestive heart failure, NYHA class 4 ([place]) (11/27/2013).  PAST SURGICAL HISTORY: She  has past surgical history that includes Abdominal hysterectomy; Dilation  and curettage of uterus; Tonsillectomy; Esophagogastroduodenoscopy (07/09/2011); Cataract extraction w/ intraocular lens  implant, bilateral (Bilateral, 03/2013); left and right heart catheterization with coronary angiogram (N/A, 12/02/2013); and Laparoscopic sigmoid colectomy (07-03-14).  Allergies  Allergen Reactions  . Tetanus Toxoid Anaphylaxis  . Penicillins Hives    Has patient had a PCN reaction causing immediate rash, facial/tongue/throat swelling, SOB or lightheadedness with hypotension: unknown  Has patient had a PCN reaction causing severe rash involving mucus membranes or skin necrosis: No  Has patient had a PCN reaction that required hospitalization: No  Has patient had a PCN reaction occurring within the last 10 years: No  If all of the above answers are "NO", then may proceed with Cephalosporin use.   . Codeine Nausea And Vomiting and Other (See Comments)    Itching and faint   . Erythromycin Nausea And Vomiting  . Gabapentin     REACTION: severe edema, nausea, dizziness, disorientation  . Levaquin [Levofloxacin In D5w] Other (See Comments)    Joint pain; (states she can take it IV- not by mouth)  . Meloxicam Itching  . Methylprednisolone Hives  . Prednisone Other (See Comments)    GI bleed    No current facility-administered medications on file prior to encounter.   Current Outpatient Prescriptions on File Prior to Encounter  Medication Sig  . aspirin 81 MG tablet Take 81 mg by mouth every other day.   . clonazePAM (KLONOPIN) 1 MG tablet TAKE 1 TABLET BY MOUTH NIGHTLY AT BEDTIME AS NEEDED (Patient taking differently: Take 1 mg by mouth at bedtime. For restless legs)  . diltiazem (CARDIZEM CD) 120 MG 24 hr capsule Take 1 capsule (120 mg total) by mouth daily. (Patient taking differently:  Take 120 mg by mouth at bedtime. )  . furosemide (LASIX) 20 MG tablet TAKE 1&1/2 TABLET BY MOUTH EVERY DAY  . ondansetron (ZOFRAN) 4 MG tablet Take 4 mg by mouth every 4 (four) hours as  needed for nausea or vomiting.  Marland Kitchen PROAIR HFA 108 (90 BASE) MCG/ACT inhaler INHALE TWO PUFFS BY MOUTH EVERY FOUR HOURS AS NEEDED (Patient taking differently: INHALE TWO PUFFS BY MOUTH EVERY FOUR HOURS AS NEEDED shortness of breath/wheezing)  . Respiratory Therapy Supplies (FLUTTER) DEVI Use as directed  . SYMBICORT 80-4.5 MCG/ACT inhaler INHAKLE 2 PUFFS INTO THE LUNGS TWICE DAILY  . tiotropium (SPIRIVA) 18 MCG inhalation capsule Place 1 capsule (18 mcg total) into inhaler and inhale daily.  . pantoprazole (PROTONIX) 40 MG tablet Take 1 tablet (40 mg total) by mouth daily. X 2 wks (Patient not taking: Reported on 09/13/2015)   ROS Unable d/t intermittent AMS   SUBJECTIVE:  Older adult female, Lethargic on BIPAP   VITAL SIGNS: BP 141/81 mmHg  Pulse 121  Temp(Src) 98 F (36.7 C) (Oral)  Resp 19  Ht 5\' 3"  (1.6 m)  Wt 112 lb 7 oz (51 kg)  BMI 19.92 kg/m2  SpO2 94%  HEMODYNAMICS:    VENTILATOR SETTINGS: Vent Mode:  [-] BIPAP FiO2 (%):  [45 %-50 %] 45 % Set Rate:  [18 bmp] 18 bmp PEEP:  [5 cmH20-8 cmH20] 5 cmH20  INTAKE / OUTPUT: I/O last 3 completed shifts: In: 240 [P.O.:240] Out: 550 [Urine:550]  PHYSICAL EXAMINATION: General: Older adult female alert off BIPAP  Neuro: alert and oriented, follows commands  HEENT:  [place]/AT Cardiovascular: RRR, no MRG  Lungs: no use of accessory muscles, diminished breath sounds at the bases, bibasilar rales Abdomen: active bs, soft, non-tender, non distended  Musculoskeletal: no acute deformities  Skin:  Cool, Dry, Intact  LABS: ABG    Component Value Date/Time   PHART 7.307* 09/16/2015 0608   PCO2ART 80.4* 09/16/2015 0608   PO2ART 70.6* 09/16/2015 0608   HCO3 39.0* 09/16/2015 0608   TCO2 35.9 09/16/2015 0608   O2SAT 92.9 09/16/2015 0608    BMET  Recent Labs Lab 09/13/15 1636 09/14/15 0457 09/16/15 0434  NA 137 139 129*  K 3.5 4.1 4.3  CL 91* 90* 83*  CO2 35* 37* 37*  BUN 10 14 12   CREATININE 0.44 0.69 0.37*  GLUCOSE  102* 84 139*    Electrolytes  Recent Labs Lab 09/13/15 1636 09/14/15 0457 09/14/15 1000 09/16/15 0434  CALCIUM 9.3 9.3  --  8.8*  MG  --   --  1.9  --     CBC  Recent Labs Lab 09/14/15 0457 09/16/15 0433 09/16/15 0434  WBC 11.8* 14.1* 14.2*  HGB 14.1 13.6 13.5  HCT 44.1 41.6 41.7  PLT 328 279 283    Coag's No results for input(s): APTT, INR in the last 168 hours.  Sepsis Markers  Recent Labs Lab 09/16/15 0537  LATICACIDVEN 0.6    ABG  Recent Labs Lab 09/16/15 0424 09/16/15 0608  PHART 7.220* 7.307*  PCO2ART  --  80.4*  PO2ART 50.0* 70.6*    Liver Enzymes  Recent Labs Lab 09/13/15 1636  AST 21  ALT 20  ALKPHOS 65  BILITOT 1.1  ALBUMIN 4.0    Cardiac Enzymes  Recent Labs Lab 09/13/15 2228 09/14/15 0457 09/14/15 1000  TROPONINI 0.06* 0.03 0.03    Glucose No results for input(s): GLUCAP in the last 168 hours.  Imaging Dg Shoulder Right  09/15/2015  CLINICAL  DATA:  Chronic right shoulder pain.  No known injury. EXAM: RIGHT SHOULDER - 2+ VIEW COMPARISON:  Chest CT 09/13/2015. FINDINGS: Moderate AC joint degenerative changes. Mild glenohumeral joint degenerative changes. No acute bony abnormality or worrisome bone lesion. The visualize right ribs are intact. The visualize right lung is clear. Stable emphysematous changes and pulmonary scarring. IMPRESSION: AC joint and glenohumeral joint degenerative changes but no acute bony findings. Electronically Signed   By: Marijo Sanes M.D.   On: 09/15/2015 14:12   Dg Chest Port 1 View  09/16/2015  CLINICAL DATA:  Acute onset of shortness of breath and decreased O2 saturation. Initial encounter. EXAM: PORTABLE CHEST 1 VIEW COMPARISON:  Chest radiograph and CTA of the chest performed 09/13/2015 FINDINGS: New left basilar airspace opacity is concerning for pneumonia. Increased vascular congestion is noted, and superimposed interstitial edema cannot be excluded. No definite pleural effusion or pneumothorax  is seen. The cardiomediastinal silhouette is borderline enlarged. No acute osseous abnormalities are identified. IMPRESSION: New left basilar airspace opacity is concerning for pneumonia. Increased vascular congestion noted, with borderline cardiomegaly. Superimposed interstitial edema cannot be excluded. Electronically Signed   By: Garald Balding M.D.   On: 09/16/2015 04:44     STUDIES:  3/26 CXR >> Chronic changes, no acute findings  3/26 CTA Chest >> No acute findings 3/27 MR T-Spine >> Acute compression fracture involving the T8 vertebral body, small layering bilateral pleural effusions   3/27 Echo >> EF 45-50%, moderate aortic stenosis  3/29 CXR >> Left basilar airspace opacity, concerning for PNA, borderline caridomegaly  CULTURES:  ANTIBIOTICS: Cefepime 3/29 >> Vancomycin 3/29 >>  SIGNIFICANT EVENTS: 3/26 >> To ED with Increase SOB, upper back pain, swelling to lower extremities   3/29 >> Rapid response called, hypoxic in the 70s, HR 130-140s, lethargic    ASSESSMENT / PLAN:  Acute on chronic hypoxic and hypercarbia respiratory failure. Multi-factorial in setting of possible HCAP vs aspiration +/- asymettric pulmonary edema in setting of severe AS and further c/b underlying COPD  ->her CXR shows new LLL airspace disease. ? Medication related sedation c/b aspiration event. Her mentation is excellent currently.  Plan   - agree w/ empiric abx -Continue pulmicort and duonebs  -cont diuresis as BP, bun/creatinine allow  -flutter valve  -Solu-medrol 50 mg q 6 hours  - hold all sedating meds -Continue BIPAP as needed & mandatory at HS, supplemental oxygen as needed   Acute Encephalopathy. Likely hypercarbia related, but family reports:  "last evening acted like she was drunk". So sounds as though she was a little encephalopathic then as well. Difficult to tell how much of this is was med related and how much was CO2 related.  Plan Dc sedating meds Cont BIPAP as needed and  HS Cont supportive care   T6 and T8 compression fracture. (T8 is acute) Plan Have stopped current analgesia Will need to re-address to ensure we meet her pain needs yet not over-sedate.   All other issues: severe AS, tachycardia & shoulder pain-->per IM service.   Erick Colace ACNP-BC [place] Pager # [phone] OR # [phone] if no answer  Attending Note:  I have examined patient, reviewed labs, studies and notes. I have discussed the case with Jerrye Bushy, and I agree with the data and plans as amended above. Patient with acute on chronic hypercapneic resp failure due to her underlying severe COPD, severe AS now compounded by some sedation, metabolic alkalosis from diuresis and a probable new LLL PNA. She  has been on and off BiPAP through the day today. On my eval she is stronger, more awake, has tolerated biPAP when needed. I am hopeful that we can treat with abx, keep volume status optimized, add steroids and will be able to avoid need for intubation. We will make BiPAp mandatory at hs for next few days even as she improves. Follow her clinically and by ABG. Independent critical care time is 40 minutes.   Baltazar Apo, MD, PhD 09/16/2015, 2:09 PM [facility] [phone] or if no answer [phone]

## 2015-09-17 DIAGNOSIS — I5043 Acute on chronic combined systolic (congestive) and diastolic (congestive) heart failure: Secondary | ICD-10-CM

## 2015-09-17 LAB — BASIC METABOLIC PANEL
ANION GAP: 7 (ref 5–15)
BUN: 14 mg/dL (ref 6–20)
CO2: 39 mmol/L — AB (ref 22–32)
Calcium: 8.8 mg/dL — ABNORMAL LOW (ref 8.9–10.3)
Chloride: 84 mmol/L — ABNORMAL LOW (ref 101–111)
Creatinine, Ser: 0.42 mg/dL — ABNORMAL LOW (ref 0.44–1.00)
GFR calc Af Amer: >60 mL/min (ref >60)
GLUCOSE: 146 mg/dL — AB (ref 65–99)
POTASSIUM: 4.6 mmol/L (ref 3.5–5.1)
Sodium: 130 mmol/L — ABNORMAL LOW (ref 135–145)

## 2015-09-17 LAB — CBC
HEMATOCRIT: 35.9 % — AB (ref 36.0–46.0)
HEMOGLOBIN: 11.7 g/dL — AB (ref 12.0–15.0)
MCH: 31.4 pg (ref 26.0–34.0)
MCHC: 32.6 g/dL (ref 30.0–36.0)
MCV: 96.2 fL (ref 78.0–100.0)
Platelets: 241 10*3/uL (ref 150–400)
RBC: 3.73 MIL/uL — ABNORMAL LOW (ref 3.87–5.11)
RDW: 13.9 % (ref 11.5–15.5)
WBC: 8.5 10*3/uL (ref 4.0–10.5)

## 2015-09-17 MED ORDER — DICLOFENAC SODIUM 1 % TD GEL
2.0000 g | Freq: Four times a day (QID) | TRANSDERMAL | Status: DC
  Administered 2015-09-17 – 2015-09-28 (×38): 2 g via TOPICAL
  Filled 2015-09-17 (×3): qty 100

## 2015-09-17 MED ORDER — FUROSEMIDE 40 MG PO TABS
40.0000 mg | ORAL_TABLET | Freq: Every day | ORAL | Status: DC
  Administered 2015-09-17 – 2015-09-20 (×4): 40 mg via ORAL
  Filled 2015-09-17 (×4): qty 1

## 2015-09-17 MED ORDER — CETYLPYRIDINIUM CHLORIDE 0.05 % MT LIQD
7.0000 mL | Freq: Two times a day (BID) | OROMUCOSAL | Status: DC
  Administered 2015-09-18 – 2015-09-27 (×18): 7 mL via OROMUCOSAL

## 2015-09-17 MED ORDER — CHLORHEXIDINE GLUCONATE 0.12 % MT SOLN
15.0000 mL | Freq: Two times a day (BID) | OROMUCOSAL | Status: DC
  Administered 2015-09-18 – 2015-09-28 (×18): 15 mL via OROMUCOSAL
  Filled 2015-09-17 (×19): qty 15

## 2015-09-17 MED ORDER — LIVING BETTER WITH HEART FAILURE BOOK: Freq: Once | Status: DC

## 2015-09-17 MED ORDER — CETYLPYRIDINIUM CHLORIDE 0.05 % MT LIQD
7.0000 mL | Freq: Two times a day (BID) | OROMUCOSAL | Status: DC
  Administered 2015-09-17: 7 mL via OROMUCOSAL

## 2015-09-17 MED ORDER — LORAZEPAM 2 MG/ML IJ SOLN
0.5000 mg | Freq: Once | INTRAMUSCULAR | Status: AC
  Administered 2015-09-17: 0.5 mg via INTRAVENOUS
  Filled 2015-09-17: qty 1

## 2015-09-17 NOTE — Progress Notes (Signed)
Per RN Pt wanting BIPAP off and is refusing to go back on.  Pt placed on 6 LPM [place] and tolerating well at this time.  RT to monitor and assess as needed.

## 2015-09-17 NOTE — Progress Notes (Signed)
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 Admitted 03/26 w/ SOB, COPD exacerbation>>PNA w/ resp failure req BiPAP 03/28 pm, AS now severe by echo. F/u [facility].  SUBJECTIVE: Breathing better today, no chest pain, not light-headed. O2 sats about 90%, but pt denies SOB, feels OK. Still coughing, some yellowish sputum Granddaughter present, wants info on managing pt CHF as OP. Says pt has extra fluid on board, but at home, weight was not changing much.  OBJECTIVE Filed Vitals:   09/17/15 0706 09/17/15 0719 09/17/15 0739 09/17/15 0753  BP:   129/42   Pulse:      Temp:      TempSrc:      Resp: 18 23 18 26   Height:      Weight:      SpO2: 95% 94% 90% 91%    Intake/Output Summary (Last 24 hours) at 09/17/15 0816 Last data filed at 09/17/15 0739  Gross per 24 hour  Intake    960 ml  Output   1575 ml  Net   -615 ml   Filed Weights   09/14/15 0500 09/15/15 0507 09/17/15 0400  Weight: 111 lb 9.6 oz (50.621 kg) 112 lb 7 oz (51 kg) 116 lb 6.5 oz (52.8 kg)    PHYSICAL EXAM General: Well developed, well nourished, female in no acute distress. Head: Normocephalic, atraumatic.  Neck: Supple without bruits, JVD 11 cm. Lungs:  Resp regular and unlabored, CTA. Heart: RRR, S1, S2, no S3, S4, or murmur; no rub. Abdomen: Soft, non-tender, non-distended, BS + x 4.    Extremities: No clubbing, cyanosis, edema.  Neuro: Alert and oriented X 3. Moves all extremities spontaneously. Psych: Normal affect.  LABS: CBC:  Recent Labs  09/16/15 0433 09/16/15 0434 09/17/15 0314  WBC 14.1* 14.2* 8.5  NEUTROABS 12.0*  --   --   HGB 13.6 13.5 11.7*  HCT 41.6 41.7 35.9*  MCV 95.6 95.6 96.2  PLT 279 283 [id number]   Basic Metabolic Panel:  Recent Labs  09/14/15 1000 09/16/15 0434 09/17/15 0314  NA  --  129* 130*  K  --  4.3 4.6  CL  --  83* 84*  CO2  --  37* 39*  GLUCOSE  --  139* 146*  BUN  --  12 14  CREATININE  --  0.37* 0.42*  CALCIUM  --  8.8* 8.8*  MG 1.9  --   --    Cardiac Enzymes:  Recent Labs  09/14/15 1000  TROPONINI 0.03   BNP:  B NATRIURETIC PEPTIDE  Date/Time Value Ref Range Status  09/16/2015 04:33 AM 629.6* 0.0 - 100.0 pg/mL Final  09/14/2015 05:00 PM 281.8* 0.0 - 100.0 pg/mL Final   TELE:  SR, bigeminy w/ effective HR high 40s, 5 bt run NSVT    Radiology/Studies: Dg Shoulder Right 09/15/2015  CLINICAL DATA:  Chronic right shoulder pain.  No known injury. EXAM: RIGHT SHOULDER - 2+ VIEW COMPARISON:  Chest CT 09/13/2015. FINDINGS: Moderate AC joint degenerative changes. Mild glenohumeral joint degenerative changes. No acute bony abnormality or worrisome bone lesion. The visualize right ribs are intact. The visualize right lung is clear. Stable emphysematous changes and pulmonary scarring. IMPRESSION: AC joint and glenohumeral joint degenerative changes but no acute bony findings. Electronically Signed   By: Marijo Sanes M.D.   On: 09/15/2015 14:12   Dg Chest Port 1 View 09/16/2015  CLINICAL DATA:  Acute onset of shortness of breath and decreased O2 saturation. Initial encounter. EXAM: PORTABLE CHEST 1 VIEW COMPARISON:  Chest radiograph and CTA of the chest performed 09/13/2015 FINDINGS: New left basilar airspace opacity is concerning for pneumonia. Increased vascular congestion is noted, and superimposed interstitial edema cannot be  excluded. No definite pleural effusion or pneumothorax is seen. The cardiomediastinal silhouette is borderline enlarged. No acute osseous abnormalities are identified. IMPRESSION: New left basilar airspace opacity is concerning for pneumonia. Increased vascular congestion noted, with borderline cardiomegaly. Superimposed interstitial edema cannot be excluded. Electronically Signed   By: Garald Balding M.D.   On: 09/16/2015 04:44     Current Medications:  . aspirin  81 mg Oral QODAY  . budesonide (PULMICORT) nebulizer solution  0.25 mg Nebulization BID  . ceFEPime (MAXIPIME) IV  2 g Intravenous Q0600  . diltiazem  120 mg Oral QHS  . enoxaparin (LOVENOX) injection  40 mg Subcutaneous Q24H  . furosemide  30 mg Oral Daily  . guaiFENesin  600 mg Oral BID  . ipratropium-albuterol  3 mL Nebulization QID  . methylPREDNISolone (SOLU-MEDROL) injection  50 mg Intravenous Q6H  . nicotine  14 mg Transdermal Daily  . sodium chloride flush  3 mL Intravenous Q12H  . vancomycin  500 mg Intravenous Q12H      ASSESSMENT AND PLAN: Principal Problem: 1. Acute on chronic respiratory failure (HCC) - HAP, COPD, CHF - HAP & COPD per IM, +/- CCM - component of CHF with elevated BNP and possible superimposed edema on CXR - got  IV Lasix 40 mg 03/29, restart po rx today, increase home Lasix to 40 mg qd, OK to take an extra 40 mg prn for weight gain. - follow BMET, I/O, weights  2. Moderate AS on echo in 2016 ---Current echo demonstrates severe aortic stenosis with mild left ventricular hypertrophy. BNP is elevated. Will have Pt follow up with Dr. Burt Knack to address the aortic valve.   3. Hx of atrial tach on recent holter, also hx of brady, here on tele she has episodes of PACs and bigeminy non conducted PACs.  -Today SR with PVCs, bigeminy and brief NSVT  4. Tobacco use: on nicoderm patch.  Otherwise, per IM Active Problems:   COPD (chronic obstructive pulmonary disease) (HCC)   COPD exacerbation  (HCC)   Mild aortic stenosis   Back pain   LBBB (left bundle branch block)   Combined systolic and diastolic heart failure (HCC)   Acute respiratory failure (HCC)   Chronic obstructive pulmonary disease (HCC)   SOB (shortness of breath)   HCAP (healthcare-associated pneumonia)   Signed, Barrett, Rhonda , PA-C 8:16 AM 09/17/2015 The patient has been seen in conjunction with Suanne Marker Barrett PA-C. All aspects of care have been considered and discussed. The patient has been personally interviewed, examined, and all clinical data has been reviewed.   Agree with note and recommendations as noted above.

## 2015-09-17 NOTE — Progress Notes (Signed)
PULMONARY / CRITICAL CARE MEDICINE   Name: Jill White MRN: [id number] DOB: [birth year]    ADMISSION DATE:  09/13/2015 CONSULTATION DATE:  3/29  REFERRING MD: TAT  CHIEF COMPLAINT:    HISTORY OF PRESENT ILLNESS:   75 y.o. female with history of severe COPD, moderate aortic stenosis, tachycardia presents to the ER on 3/26 w cc: back pain and dyspnea.  Denied any fall or trauma. Pain is mostly in the upper mid back increases on deep inspiration and movement. This led to patient becoming more short of breath. 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 Also were evaluating back pain.  Patient also recently noticed increasing swelling of her lower extremity for which patient started increasing her Lasix dose from 30 mg daily to 40 mg daily. Patient's Cardizem dose was increased a change from 30 mg 4 times daily to 120 mg at bedtime. Was being treated w/ Ultram for pain d/t h/o narcotic sensitivity. On 3/29 Rapid response was called as patient became hypoxic, and hypercarbic, was also tachycardiac, and obtunded. Was transferred to [facility] unit given Lasix and placed on BIPAP. PCCM asked to consult.    SUBJECTIVE:  Drowsy but more alert, wore bipap sporadically overnight.   VITAL SIGNS: BP 129/42 mmHg  Pulse 71  Temp(Src) 98.4 F (36.9 C) (Oral)  Resp 24  Ht 5\' 3"  (1.6 m)  Wt 52.8 kg (116 lb 6.5 oz)  BMI 20.63 kg/m2  SpO2 94%  HEMODYNAMICS:    VENTILATOR SETTINGS: Vent Mode:  [-] BIPAP FiO2 (%):  [40 %-60 %] 40 % Set Rate:  [18 bmp] 18 bmp PEEP:  [5 cmH20-10 cmH20] 5 cmH20  INTAKE / OUTPUT: I/O last 3 completed shifts: In: 1080 [P.O.:390; Other:390; IV Piggyback:300] Out: 1925 [Urine:1925]  PHYSICAL EXAMINATION: General: Older adult female alert off BIPAP  Neuro: drowsy but arousable, follows commands  HEENT:  [place]/AT Cardiovascular: RRR, no MRG  Lungs: no use of accessory muscles, diminished breath sounds at the bases, bibasilar rales Abdomen: active  bs, soft, non-tender, non distended  Musculoskeletal: no acute deformities  Skin:  Cool, Dry, Intact  LABS: ABG    Component Value Date/Time   PHART 7.307* 09/16/2015 0608   PCO2ART 80.4* 09/16/2015 0608   PO2ART 70.6* 09/16/2015 0608   HCO3 39.0* 09/16/2015 0608   TCO2 35.9 09/16/2015 0608   O2SAT 92.9 09/16/2015 0608    BMET  Recent Labs Lab 09/14/15 0457 09/16/15 0434 09/17/15 0314  NA 139 129* 130*  K 4.1 4.3 4.6  CL 90* 83* 84*  CO2 37* 37* 39*  BUN 14 12 14   CREATININE 0.69 0.37* 0.42*  GLUCOSE 84 139* 146*    Electrolytes  Recent Labs Lab 09/14/15 0457 09/14/15 1000 09/16/15 0434 09/17/15 0314  CALCIUM 9.3  --  8.8* 8.8*  MG  --  1.9  --   --     CBC  Recent Labs Lab 09/16/15 0433 09/16/15 0434 09/17/15 0314  WBC 14.1* 14.2* 8.5  HGB 13.6 13.5 11.7*  HCT 41.6 41.7 35.9*  PLT 279 283 241    Coag's No results for input(s): APTT, INR in the last 168 hours.  Sepsis Markers  Recent Labs Lab 09/16/15 0433 09/16/15 0537  LATICACIDVEN  --  0.6  PROCALCITON <0.10  --     ABG  Recent Labs Lab 09/16/15 0424 09/16/15 0608  PHART 7.220* 7.307*  PCO2ART ABOVE REPORTABLE RANGE 80.4*  PO2ART 50.0* 70.6*    Liver Enzymes  Recent Labs Lab  09/13/15 1636  AST 21  ALT 20  ALKPHOS 65  BILITOT 1.1  ALBUMIN 4.0    Cardiac Enzymes  Recent Labs Lab 09/13/15 2228 09/14/15 0457 09/14/15 1000  TROPONINI 0.06* 0.03 0.03    Glucose No results for input(s): GLUCAP in the last 168 hours.  Imaging No results found.   STUDIES:  3/26 CXR >> Chronic changes, no acute findings  3/26 CTA Chest >> No acute findings 3/27 MR T-Spine >> Acute compression fracture involving the T8 vertebral body, small layering bilateral pleural effusions   3/27 Echo >> EF 45-50%, moderate aortic stenosis  3/29 CXR >> Left basilar airspace opacity, concerning for PNA, borderline caridomegaly  CULTURES:  ANTIBIOTICS: Cefepime 3/29 >> Vancomycin 3/29  >>  SIGNIFICANT EVENTS: 3/26 >> To ED with Increase SOB, upper back pain, swelling to lower extremities   3/29 >> Rapid response called, hypoxic in the 70s, HR 130-140s, lethargic    ASSESSMENT / PLAN:  Acute on chronic hypoxic and hypercarbia respiratory failure. Multi-factorial in setting of possible HCAP vs aspiration +/- asymettric pulmonary edema in setting of severe AS and further c/b underlying COPD  ->her CXR shows new LLL airspace disease. ? Medication related sedation c/b aspiration event.  Plan   - agree w/ empiric abx -Continue pulmicort and duonebs  -cont diuresis as BP, bun/creatinine allow  -flutter valve  -Solu-medrol 50 mg q 6 hours  - hold all sedating meds  -Continue BIPAP as needed & mandatory at HS, supplemental oxygen as needed  -swallow eval -- has hx dysphagia per daughter  -mobilize   Acute Encephalopathy. Likely hypercarbia related, but family reports:  "last evening acted like she was drunk". So sounds as though she was a little encephalopathic then as well. Difficult to tell how much of this is was med related and how much was CO2 related.  Plan Hold all sedating meds Cont BIPAP as needed and HS Cont supportive care   T6 and T8 compression fracture. (T8 is acute) Plan Have stopped current analgesia continue to re-address to ensure we meet her pain needs yet not over-sedate.   All other issues: severe AS, tachycardia & shoulder pain-->per IM service.     Nickolas Madrid, NP 09/17/2015  11:25 AM Pager: [phone] or [phone]  Attending Note:  I have examined patient, reviewed labs, studies and notes. I have discussed the case with Shon Millet, and I agree with the data and plans as amended above. Acute resp failure in setting severe COPD, apparent LLL PNA, Aortic stenosis. She has required BiPAP, currently improved and tells me that she won't need it anymore. She is hungry adn wants to eat. I have given her a diet but have also requested  that she still get her swallowing eval as planned to allow [place] to gauge her aspiration risk.   Baltazar Apo, MD, PhD 09/17/2015, 11:45 AM [facility] [phone] or if no answer [phone]

## 2015-09-17 NOTE — Progress Notes (Signed)
Pt remains off BIPAP. Pt on 2 LPM  at this time.

## 2015-09-17 NOTE — Progress Notes (Signed)
The patient expelled a hardened chunk of gray material from her nose, measuring approximately 2 by 2 cm.

## 2015-09-17 NOTE — Evaluation (Signed)
Clinical/Bedside Swallow Evaluation Patient Details  Name: Jill White MRN: [id number] Date of Birth: Aug 30, 1940  Today's Date: 09/17/2015 Time: SLP Start Time (ACUTE ONLY): 1525 SLP Stop Time (ACUTE ONLY): 1600 SLP Time Calculation (min) (ACUTE ONLY): 35 min  Past Medical History:  Past Medical History  Diagnosis Date  . HIP PAIN   . HYPERTENSION   . Restless leg syndrome   . COPD (chronic obstructive pulmonary disease) (HCC)     a. Multiple prior admissions for acute hypoxic respiratory failure in setting of COPD exacerbations +/- pneumonia.  . Peptic ulcer disease     a. 2013: gastric antrum.  Marland Kitchen Hyponatremia   . Tobacco abuse   . Hypomagnesemia   . LBBB (left bundle branch block)     a. Intermittent, likely rate related.  . Pulmonary HTN ([place])     a. Elevated PA pressure by prior echoes.  . Mitral regurgitation     a. Mild MR by echo 2014, not mentioned on 11/2013 (severely calcified annulus).  Marland Kitchen Respiratory failure ([place])     a. Adm 11/2013 with severe hypercarbic and hypoxemic respiratory failure requiring intubation.   . Abnormal echocardiogram     a. 11/2013: decreased EF 45-50%, moderate AS- > subsequent cath 12/02/13 with mildly calcified cors without obstruction, mild AS, EF 60%, mild pulm HTN.  Marland Kitchen Aortic stenosis     a. 11/2013: mod by echo, then mild by cath.  . Pulmonary HTN ([place])     a. 11/2013: mild by cath.  . Wide-complex tachycardia ([place])     a. Felt likely atrial tach with rate-related LBBB.  Marland Kitchen Chronic diastolic CHF (congestive heart failure) ([place])   . Diverticulitis of colon (without mention of hemorrhage) 08/09/2013  . Septic shock ([place]) 12/11/2013  . Acute on chronic combined systolic and diastolic congestive heart failure, NYHA class 4 ([place]) 11/27/2013   Past Surgical History:  Past Surgical History  Procedure Laterality Date  . Abdominal hysterectomy      vaginal hysterectomy  . Dilation and curettage of uterus    . Tonsillectomy    .  Esophagogastroduodenoscopy  07/09/2011    Procedure: ESOPHAGOGASTRODUODENOSCOPY (EGD);  Surgeon: Missy Sabins, MD;  Location: [facility];  Service: Endoscopy;  Laterality: N/A;  patient in room 1532  . Cataract extraction w/ intraocular lens  implant, bilateral Bilateral 03/2013    Patient claims it was within the month.   . Left and right heart catheterization with coronary angiogram N/A 12/02/2013    Procedure: LEFT AND RIGHT HEART CATHETERIZATION WITH CORONARY ANGIOGRAM;  Surgeon: Blane Ohara, MD;  Location: [facility] CATH LAB;  Service: Cardiovascular;  Laterality: N/A;  . Laparoscopic sigmoid colectomy  07-03-14    per Dr. Malachi Carl in [place]    HPI:  75 yo female adm to [facility] with respiratory difficulties.  Pt has h/o COPD, HTN, aortic stenosis, C6/C8 compression fx, smoker, bronchitis, possible small gastric ulcer discovered during endoscopy 07/09/2011 and treated with PPI.  She had mental status changes during hospital stay and required Bipap for respiratory support.  Pt reports she suspects she was overmedicated.   Concern for possible aspiration present and swallow evaluation ordered.     Assessment / Plan / Recommendation Clinical Impression  Pt with negative cranial nerve exam for swallow musculature/sensate.   Pt able to self feed with timely swallow and clear voice throughout.  She does cough with body movement, exertion,  laughing and did cough x2 during po consumption - but do not suspect  aspiration related.   SLP observed pt consuming 4 ounces juice, 2 ounces soda, 4 crackers and 4 ounces pudding.  Swallow was timely with clear voice throughout.  Pt denies dysphagia symptoms at this time but used caution with eating/drinking and tolerated well without increased dyspnea. Educated pt to [employer], compensation strategies (need to start intake with liquids d/t xerostomia, pH neutral status of water, timing of swallow at mid exhalation for swallow/resp coordination etc)  to  mitigate episodic aspiration risk given pt's COPD - provided info in writing and verbally.  Recommend regular/thin diet.  SLP to sign off as all education completed. Please reorder if indicated or desire to rule out overt silent aspiration.       Aspiration Risk       Diet Recommendation Regular;Thin liquid   Liquid Administration via: Cup;Straw Medication Administration:  (as tolerated) Supervision: Patient able to self feed Compensations:  (start meal with liquids, rest if short of breath) Postural Changes: Seated upright at 90 degrees;Remain upright for at least 30 minutes after po intake    Other  Recommendations Oral Care Recommendations: Oral care BID   Follow up Recommendations  None    Frequency and Duration   n/a         Prognosis   n/a     Swallow Study   General Date of Onset: 09/17/15 HPI: 75 yo female adm to [facility] with respiratory difficulties.  Pt has h/o COPD, HTN, aortic stenosis, C6/C8 compression fx, smoker, bronchitis, possible small gastric ulcer discovered during endoscopy 07/09/2011 and treated with PPI.  She had mental status changes during hospital stay and required Bipap for respiratory support.  Pt reports she suspects she was overmedicated.   Concern for possible aspiration present and swallow evaluation ordered.   Type of Study: Bedside Swallow Evaluation Diet Prior to this Study: NPO Temperature Spikes Noted: No Respiratory Status: Nasal cannula Behavior/Cognition: Alert;Cooperative Oral Cavity Assessment: Erythema Oral Care Completed by SLP: No Oral Cavity - Dentition: Adequate natural dentition Vision: Functional for self-feeding Self-Feeding Abilities: Able to feed self Patient Positioning: Upright in bed (upright at edge of bed) Baseline Vocal Quality: Hoarse Volitional Cough: Strong (productive cough) Volitional Swallow: Able to elicit    Oral/Motor/Sensory Function Overall Oral Motor/Sensory Function: Within functional limits   Ice Chips  Ice chips: Within functional limits Presentation: Spoon;Self Fed   Thin Liquid Thin Liquid: Within functional limits Presentation: Cup;Straw;Self Fed Cough x1 - do not suspect aspiration related    Nectar Thick Nectar Thick Liquid: Not tested   Honey Thick Honey Thick Liquid: Not tested   Puree Puree: Within functional limits Presentation: Self Fed;Spoon   Solid   GO   Solid: Within functional limits Presentation: Self Fed Other Comments: cough x1 - do not suspect aspiration related        Claudie Fisherman, [employer] [facility] SLP [phone]

## 2015-09-17 NOTE — Progress Notes (Signed)
PROGRESS NOTE  Jill White [id number] DOB: Sep 09, 1940 DOA: 09/13/2015 PCP: Laurey Morale, MD   Brief History 75 year old female with history of hypertension, COPD, moderate aortic stenosis, tachycardia presented with increasing shortness of breath after developing new onset upper back pain. There was no history of trauma or injury. Pain in the upper back appeared to be worsening with deep inspiration and movement. The pain resulted in increasing shortness of breath. Initial chest x-ray was negative for acute findings. On the early morning of 09/16/2015, the patient developed respiratory distress and was placed on BiPAP.  CCM was consulted and pt was started on IV steroids and abx. Assessment/Plan: Acute on chronic respiratory failure with hypoxia and hypercarbia -Secondary to HAP and CHF in the setting of severe aortic stenosis and COPD -pt had component of fluid overload with edema on CXR and peripheral edema-->furosemide per cardiology -Continue BiPAP at hs -09/16/15--appreciate pulmonary consult -continue IV steroids -continue pulmicort and Duonebs -minimize opioid/hypnotic medications -speech therapy evaluation-->aspiration while pt was encephalopathic -granddaughter/family appears to have unrealistic expectations  HAP -vanco and cefepime started 3/29 -procalcitonin <0.10 -lactic acid 0.6  T6 and T8 compression fracture -09/13/2015 CT angio chest negative for pulmonary embolus -09/14/2015 MRI thoracic spine acute T8 compression fracture -minimize opioid/hypnotic meds  Moderate to severe aortic stenosis -Appreciate cardiology follow-up -ultimately need evaluation for TAVR  Right shoulder pain -Recent steroid injection 09/03/2015 -Ultrasound of the shoulder reveals subacromial bursitis, 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  No known injury. EXAM: RIGHT SHOULDER - 2+ VIEW COMPARISON:  Chest CT 09/13/2015. FINDINGS: Moderate AC joint degenerative changes. Mild glenohumeral joint degenerative changes. No acute bony abnormality or worrisome bone lesion. The visualize right ribs are intact. The visualize right lung is clear. Stable emphysematous changes and pulmonary scarring. IMPRESSION: AC joint and glenohumeral joint degenerative changes but no acute bony findings. Electronically Signed   By: Marijo Sanes M.D.   On: 09/15/2015 14:12   Ct Angio Chest Pe W/cm &/or Wo Cm  09/13/2015  CLINICAL DATA:  75 year old female with shortness of breath and upper back pain. EXAM: CT ANGIOGRAPHY CHEST WITH CONTRAST TECHNIQUE: Multidetector CT imaging of the chest was performed using the standard protocol during bolus administration of intravenous contrast. Multiplanar CT image reconstructions and MIPs were obtained to evaluate the vascular anatomy. CONTRAST:  155mL OMNIPAQUE IOHEXOL 350 MG/ML SOLN COMPARISON:  Chest radiograph dated 09/13/2015 FINDINGS: There is emphysematous changes of the lungs. No focal consolidation, pleural effusion, or pneumothorax. The central airways are patent. There is atherosclerotic calcification of the thoracic aorta. No CT evidence of pulmonary embolism. There is no cardiomegaly or pericardial effusion. There is coronary vascular calcification. There is no hilar or mediastinal adenopathy. The esophagus and the thyroid gland appear  unremarkable. There is no axillary adenopathy. The chest wall soft tissues appear unremarkable. There is osteopenia with degenerative changes of the spine. C6 compression fracture with anterior wedging, likely old. There is  T8 compression fracture with mild sclerotic changes, age indeterminate, likely old. Clinical correlation is recommended. The visualized upper abdomen appears unremarkable. Review of the MIP images confirms the above findings. IMPRESSION: No CT evidence of pulmonary embolism. Osteopenia with multilevel compression fractures of the thoracic spine, age indeterminate, likely old. Clinical correlation is recommended. Electronically Signed   By: Anner Crete M.D.   On: 09/13/2015 23:43   Mr Thoracic Spine Wo Contrast  09/14/2015  CLINICAL DATA:  Initial evaluation for acute back pain EXAM: MRI THORACIC SPINE WITHOUT CONTRAST TECHNIQUE: Multiplanar, multisequence MR imaging of the thoracic spine was performed. No intravenous contrast was administered. COMPARISON:  Prior CT from 09/13/2015. FINDINGS: Limited views of the cervical spine demonstrate multilevel degenerative spondylolysis without severe stenosis. Vertebral bodies are normally aligned with preservation of the normal thoracic kyphosis. Chronic anterior wedging deformity of the T6 vertebral body with without significant retropulsion. There is abnormal T1 hypo intense, T2/STIR hyperintense signal intensity within the T8 vertebral body, consistent with acute compression fracture. Mild 30% of central height loss without bony retropulsion. No other acute or subacute appearing compression fracture identified. No listhesis or malalignment. Signal intensity within the vertebral body bone marrow within normal limits. Small subcentimeter lesion with the T11 vertebral body noted, indeterminate, but may reflect a small hemangioma. Signal intensity within the visualized thoracic cord is normal. Please note that the distal cord is not visualized on  this exam. No acute paraspinous soft tissue abnormality. Small layering bilateral pleural effusions noted. Heavy atheromatous plaque within the visualized aorta. Intraluminal fluid noted within the visualized esophagus . T1-2: Mild disc bulge and posterior element hypertrophy. No significant stenosis. T2-3: Mild disc bulge and posterior element hypertrophy. No significant stenosis. T3-4:  Mild broad-based disc bulge without significant stenosis. T4-5:  Minimal disc bulge without stenosis. T5-6:  Negative. T6-7: Mild bony retropulsion related to the chronic T6 vertebral body fracture. Superimposed tiny central disc protrusion. Minimal cord flattening without cord signal changes. Mild posterior element hypertrophy. Very mild canal stenosis. T7-T8:  Negative. T8-9:  Mild posterior element hypertrophy without stenosis. T9-10: Mild posterior element hypertrophy without stenosis. No significant disc bulge. T10-11: Posterior element hypertrophy without stenosis. No significant disc bulge. T11-12: Bilateral facet arthrosis without significant stenosis. No significant disc bulge. T12-L1: Seen only on sagittal projection. Disc bulge with reactive endplate changes. No significant stenosis. IMPRESSION: 1. Acute compression fracture involving the T8 vertebral body with associated mild 30% height loss without bony retropulsion. 2. No other acute abnormality within the thoracic spine. 3. Chronic compression deformity of T6 with resultant mild canal stenosis. 4. 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 Please see above report for a full description these findings. 5. Small layering bilateral pleural effusions. Electronically Signed   By: Jeannine Boga M.D.   On: 09/14/2015 22:42   [place] Extrem Up Right Ltd  09/07/2015  [facility] US performed of: Right This study was ordered, performed, and interpreted by Charlann Boxer D.O. Shoulder:  Supraspinatus: Degenerative changes noted but no true acute tear.  Patient does have bursal bulge. Underlying arthritic changes noted Infraspinatus: Appears normal on long and transverse views. Significant increase in Doppler flow Subscapularis: Degenerative changes noted. Positive bursa Teres Minor: Appears normal on long and transverse views. AC joint: Moderate arthritis Glenohumeral Joint: Moderate arthritis. Glenoid Labrum: Intact without visualized tears. Biceps Tendon: Appears normal on long and transverse views, no fraying of tendon, tendon located in intertubercular groove, no subluxation with shoulder internal or external rotation. Impression: Subacromial bursitis, moderate to severe  underlying arthritic changes. Procedure: Real-time Ultrasound Guided Injection of right glenohumeral joint Device: GE Logiq E  Ultrasound guided injection is preferred based studies that show increased duration, increased effect, greater accuracy, decreased procedural pain, increased response rate with ultrasound guided versus blind injection.  Verbal informed consent obtained.  Time-out conducted.  Noted no overlying erythema, induration, or other signs of local infection.  Skin prepped in a sterile fashion.  Local anesthesia: Topical Ethyl chloride.  With sterile technique and under real time ultrasound guidance: Joint visualized. 23g 1  inch needle inserted posterior approach. Pictures taken for needle placement. Patient did have injection of 2 cc of 1% lidocaine, 2 cc of 0.5% Marcaine, and 1.0 cc of Kenalog 40 mg/dL. Completed without difficulty  Pain immediately resolved suggesting accurate placement of the medication.  Advised to call if fevers/chills, erythema, induration, drainage, or persistent bleeding.  Images permanently stored and available for review in the ultrasound unit.  Impression: Technically successful ultrasound guided injection.  Dg Chest Port 1 View  09/16/2015  CLINICAL DATA:  Acute onset of shortness of breath and decreased O2 saturation.  Initial encounter. EXAM: PORTABLE CHEST 1 VIEW COMPARISON:  Chest radiograph and CTA of the chest performed 09/13/2015 FINDINGS: New left basilar airspace opacity is concerning for pneumonia. Increased vascular congestion is noted, and superimposed interstitial edema cannot be excluded. No definite pleural effusion or pneumothorax is seen. The cardiomediastinal silhouette is borderline enlarged. No acute osseous abnormalities are identified. IMPRESSION: New left basilar airspace opacity is concerning for pneumonia. Increased vascular congestion noted, with borderline cardiomegaly. Superimposed interstitial edema cannot be excluded. Electronically Signed   By: Garald Balding M.D.   On: 09/16/2015 04:44   Dg Chest Portable 1 View  09/13/2015  CLINICAL DATA:  Shortness of breath, bilateral lower extremity edema EXAM: PORTABLE CHEST 1 VIEW COMPARISON:  06/23/2015 FINDINGS: Increased interstitial markings, chronic. Mild cephalization after. This appearance suggests chronic compensated CHF without frank interstitial edema. No pleural effusion or pneumothorax. The heart is top-normal in size. IMPRESSION: Chronic interstitial markings.  No frank interstitial edema. No definite pleural effusions. Electronically Signed   By: Julian Hy M.D.   On: 09/13/2015 16:34         Subjective:   Objective: Filed Vitals:   09/17/15 0926 09/17/15 1116 09/17/15 1145 09/17/15 1200  BP:   132/51   Pulse:      Temp:    98.9 F (37.2 C)  TempSrc:    Oral  Resp: 22 24 19    Height:      Weight:      SpO2: 91% 94% 90%     Intake/Output Summary (Last 24 hours) at 09/17/15 1422 Last data filed at 09/17/15 1346  Gross per 24 hour  Intake   1330 ml  Output   1400 ml  Net    -70 ml   Weight change:  Exam:   General:  Pt is alert, follows commands appropriately, not in acute distress  HEENT: No icterus, No thrush, No neck mass, [place]/AT  Cardiovascular: RRR, S1/S2, no rubs, no gallops  Respiratory: CTA  bilaterally, no wheezing, no crackles, no rhonchi  Abdomen: Soft/+BS, non tender, non distended, no guarding  Extremities: No edema, No lymphangitis, No petechiae, No rashes, no synovitis  Data Reviewed: Basic Metabolic Panel:  Recent Labs Lab 09/13/15 1636 09/14/15 0457 09/14/15 1000 09/16/15 0434 09/17/15 0314  NA 137 139  --  129* 130*  K 3.5 4.1  --  4.3 4.6  CL 91* 90*  --  83* 84*  CO2 35* 37*  --  37* 39*  GLUCOSE 102* 84  --  139* 146*  BUN 10 14  --  12 14  CREATININE 0.44 0.69  --  0.37* 0.42*  CALCIUM 9.3 9.3  --  8.8* 8.8*  MG  --   --  1.9  --   --    Liver Function Tests:  Recent Labs Lab 09/13/15 1636  AST 21  ALT 20  ALKPHOS 65  BILITOT 1.1  PROT 7.1  ALBUMIN 4.0   No results for input(s): LIPASE, AMYLASE in the last 168 hours. No results for input(s): AMMONIA in the last 168 hours. CBC:  Recent Labs Lab 09/13/15 1636 09/14/15 0457 09/16/15 0433 09/16/15 0434 09/17/15 0314  WBC 10.9* 11.8* 14.1* 14.2* 8.5  NEUTROABS  --   --  12.0*  --   --   HGB 14.8 14.1 13.6 13.5 11.7*  HCT 42.7 44.1 41.6 41.7 35.9*  MCV 91.2 96.5 95.6 95.6 96.2  PLT 282 328 279 283 241   Cardiac Enzymes:  Recent Labs Lab 09/13/15 2228 09/14/15 0457 09/14/15 1000  TROPONINI 0.06* 0.03 0.03   BNP: Invalid input(s): POCBNP CBG: No results for input(s): GLUCAP in the last 168 hours.  Recent Results (from the past 240 hour(s))  MRSA PCR Screening     Status: None   Collection Time: 09/16/15  8:13 PM  Result Value Ref Range Status   MRSA by PCR NEGATIVE NEGATIVE Final    Comment:        The GeneXpert MRSA Assay (FDA approved for NASAL specimens only), is one component of a comprehensive MRSA colonization surveillance program. It is not intended to diagnose MRSA infection nor to guide or monitor treatment for MRSA infections.      Scheduled Meds: . antiseptic oral rinse  7 mL Mouth Rinse BID  . aspirin  81 mg Oral QODAY  . budesonide (PULMICORT)  nebulizer solution  0.25 mg Nebulization BID  . ceFEPime (MAXIPIME) IV  2 g Intravenous Q0600  . diltiazem  120 mg Oral QHS  . enoxaparin (LOVENOX) injection  40 mg Subcutaneous Q24H  . furosemide  40 mg Oral Daily  . guaiFENesin  600 mg Oral BID  . ipratropium-albuterol  3 mL Nebulization QID  . Living Better with Heart Failure Book   Does not apply Once  . methylPREDNISolone (SOLU-MEDROL) injection  50 mg Intravenous Q6H  . nicotine  14 mg Transdermal Daily  . sodium chloride flush  3 mL Intravenous Q12H  . vancomycin  500 mg Intravenous Q12H   Continuous Infusions:    Rhylynn Perdomo, DO  [facility] Pager [phone]  If 7PM-7AM, please contact night-coverage [email or web address] Password [email or web address] 09/17/2015, 2:22 PM   LOS: 4 days

## 2015-09-18 DIAGNOSIS — I5030 Unspecified diastolic (congestive) heart failure: Secondary | ICD-10-CM | POA: Insufficient documentation

## 2015-09-18 LAB — PROCALCITONIN

## 2015-09-18 LAB — CBC
HEMATOCRIT: 35.7 % — AB (ref 36.0–46.0)
HEMOGLOBIN: 11.9 g/dL — AB (ref 12.0–15.0)
MCH: 31.3 pg (ref 26.0–34.0)
MCHC: 33.3 g/dL (ref 30.0–36.0)
MCV: 93.9 fL (ref 78.0–100.0)
PLATELETS: 258 10*3/uL (ref 150–400)
RBC: 3.8 MIL/uL — AB (ref 3.87–5.11)
RDW: 13.9 % (ref 11.5–15.5)
WBC: 10.9 10*3/uL — AB (ref 4.0–10.5)

## 2015-09-18 LAB — BASIC METABOLIC PANEL
ANION GAP: 10 (ref 5–15)
BUN: 15 mg/dL (ref 6–20)
CHLORIDE: 83 mmol/L — AB (ref 101–111)
CO2: 39 mmol/L — AB (ref 22–32)
Calcium: 8.8 mg/dL — ABNORMAL LOW (ref 8.9–10.3)
Creatinine, Ser: 0.43 mg/dL — ABNORMAL LOW (ref 0.44–1.00)
GFR calc non Af Amer: >60 mL/min (ref >60)
Glucose, Bld: 140 mg/dL — ABNORMAL HIGH (ref 65–99)
POTASSIUM: 3.9 mmol/L (ref 3.5–5.1)
Sodium: 132 mmol/L — ABNORMAL LOW (ref 135–145)

## 2015-09-18 MED ORDER — LEVOFLOXACIN IN D5W 750 MG/150ML IV SOLN
750.0000 mg | INTRAVENOUS | Status: DC
  Administered 2015-09-18 – 2015-09-21 (×4): 750 mg via INTRAVENOUS
  Filled 2015-09-18 (×4): qty 150

## 2015-09-18 MED ORDER — PANTOPRAZOLE SODIUM 40 MG PO TBEC: 80.0000 mg | DELAYED_RELEASE_TABLET | Freq: Every day | ORAL | Status: DC

## 2015-09-18 MED ORDER — PREDNISONE 50 MG PO TABS
50.0000 mg | ORAL_TABLET | Freq: Two times a day (BID) | ORAL | Status: DC
  Filled 2015-09-18: qty 1

## 2015-09-18 MED ORDER — LORAZEPAM 2 MG/ML IJ SOLN
0.5000 mg | Freq: Once | INTRAMUSCULAR | Status: AC
  Administered 2015-09-18: 0.5 mg via INTRAVENOUS
  Filled 2015-09-18: qty 1

## 2015-09-18 MED ORDER — CLONAZEPAM 0.5 MG PO TABS
0.5000 mg | ORAL_TABLET | Freq: Every day | ORAL | Status: DC
  Administered 2015-09-18 – 2015-09-20 (×3): 0.5 mg via ORAL
  Filled 2015-09-18 (×3): qty 1

## 2015-09-18 MED ORDER — METHYLPREDNISOLONE SODIUM SUCC 125 MG IJ SOLR
60.0000 mg | Freq: Two times a day (BID) | INTRAMUSCULAR | Status: DC
  Administered 2015-09-18 – 2015-09-19 (×2): 60 mg via INTRAVENOUS
  Filled 2015-09-18 (×2): qty 2

## 2015-09-18 MED ORDER — PANTOPRAZOLE SODIUM 40 MG PO TBEC
40.0000 mg | DELAYED_RELEASE_TABLET | Freq: Every day | ORAL | Status: DC
  Administered 2015-09-18 – 2015-09-20 (×3): 40 mg via ORAL
  Filled 2015-09-18 (×3): qty 1

## 2015-09-18 MED ORDER — PROCHLORPERAZINE EDISYLATE 5 MG/ML IJ SOLN
5.0000 mg | Freq: Four times a day (QID) | INTRAMUSCULAR | Status: DC | PRN
  Administered 2015-09-18: 5 mg via INTRAVENOUS
  Filled 2015-09-18: qty 2
  Filled 2015-09-18: qty 1

## 2015-09-18 NOTE — Progress Notes (Signed)
PULMONARY / CRITICAL CARE MEDICINE   Name: MEL MALCOM MRN: [id number] DOB: 10/27/1940    ADMISSION DATE:  09/13/2015 CONSULTATION DATE:  3/29  REFERRING MD: TAT  CHIEF COMPLAINT:    HISTORY OF PRESENT ILLNESS:   75 y.o. female with history of severe COPD, moderate aortic stenosis, tachycardia presents to the ER on 3/26 w cc: back pain and dyspnea.  Denied any fall or trauma. Pain is mostly in the upper mid back increases on deep inspiration and movement. This led to patient becoming more short of breath. 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 Also were evaluating back pain.  Patient also recently noticed increasing swelling of her lower extremity for which patient started increasing her Lasix dose from 30 mg daily to 40 mg daily. Patient's Cardizem dose was increased a change from 30 mg 4 times daily to 120 mg at bedtime. Was being treated w/ Ultram for pain d/t h/o narcotic sensitivity. On 3/29 Rapid response was called as patient became hypoxic, and hypercarbic, was also tachycardiac, and obtunded. Was transferred to [facility] unit given Lasix and placed on BIPAP. PCCM asked to consult.    SUBJECTIVE:  Tried to wear BiPAP last night - with difficulty Having restless legs, states that she is on Requip at home but I do not see it on her home med list.  clonazepam was held given her lethargy and marginal resp status.   VITAL SIGNS: BP 136/66 mmHg  Pulse 71  Temp(Src) 98.1 F (36.7 C) (Oral)  Resp 20  Ht 5\' 3"  (1.6 m)  Wt 55.4 kg (122 lb 2.2 oz)  BMI 21.64 kg/m2  SpO2 94%  HEMODYNAMICS:    VENTILATOR SETTINGS: Vent Mode:  [-] PCV;BIPAP FiO2 (%):  [40 %] 40 % Set Rate:  [18 bmp] 18 bmp PEEP:  [5 cmH20] 5 cmH20  INTAKE / OUTPUT: I/O last 3 completed shifts: In: 1300 [P.O.:520; Other:330; IV Piggyback:450] Out: 2350 [Urine:2350]  PHYSICAL EXAMINATION: General: Older adult female alert off BIPAP  Neuro: drowsy but arousable, follows commands   HEENT:  [place]/AT Cardiovascular: RRR, no MRG  Lungs: no use of accessory muscles, diminished breath sounds at the bases, bibasilar rales Abdomen: active bs, soft, non-tender, non distended  Musculoskeletal: no acute deformities  Skin:  Cool, Dry, Intact  LABS: ABG    Component Value Date/Time   PHART 7.307* 09/16/2015 0608   PCO2ART 80.4* 09/16/2015 0608   PO2ART 70.6* 09/16/2015 0608   HCO3 39.0* 09/16/2015 0608   TCO2 35.9 09/16/2015 0608   O2SAT 92.9 09/16/2015 0608    BMET  Recent Labs Lab 09/16/15 0434 09/17/15 0314 09/18/15 0316  NA 129* 130* 132*  K 4.3 4.6 3.9  CL 83* 84* 83*  CO2 37* 39* 39*  BUN 12 14 15   CREATININE 0.37* 0.42* 0.43*  GLUCOSE 139* 146* 140*    Electrolytes  Recent Labs Lab 09/14/15 1000 09/16/15 0434 09/17/15 0314 09/18/15 0316  CALCIUM  --  8.8* 8.8* 8.8*  MG 1.9  --   --   --     CBC  Recent Labs Lab 09/16/15 0434 09/17/15 0314 09/18/15 0316  WBC 14.2* 8.5 10.9*  HGB 13.5 11.7* 11.9*  HCT 41.7 35.9* 35.7*  PLT 283 241 258    Coag's No results for input(s): APTT, INR in the last 168 hours.  Sepsis Markers  Recent Labs Lab 09/16/15 0433 09/16/15 0537 09/18/15 0316  LATICACIDVEN  --  0.6  --   PROCALCITON <0.10  --  <  0.10    ABG  Recent Labs Lab 09/16/15 0424 09/16/15 0608  PHART 7.220* 7.307*  PCO2ART ABOVE REPORTABLE RANGE 80.4*  PO2ART 50.0* 70.6*    Liver Enzymes  Recent Labs Lab 09/13/15 1636  AST 21  ALT 20  ALKPHOS 65  BILITOT 1.1  ALBUMIN 4.0    Cardiac Enzymes  Recent Labs Lab 09/13/15 2228 09/14/15 0457 09/14/15 1000  TROPONINI 0.06* 0.03 0.03    Glucose No results for input(s): GLUCAP in the last 168 hours.  Imaging No results found.   STUDIES:  3/26 CXR >> Chronic changes, no acute findings  3/26 CTA Chest >> No acute findings 3/27 MR T-Spine >> Acute compression fracture involving the T8 vertebral body, small layering bilateral pleural effusions   3/27 Echo >> EF  45-50%, moderate aortic stenosis  3/29 CXR >> Left basilar airspace opacity, concerning for PNA, borderline caridomegaly  CULTURES:  ANTIBIOTICS: Cefepime 3/29 >> Vancomycin 3/29 >>  SIGNIFICANT EVENTS: 3/26 >> To ED with Increase SOB, upper back pain, swelling to lower extremities   3/29 >> Rapid response called, hypoxic in the 70s, HR 130-140s, lethargic    ASSESSMENT / PLAN:  Acute on chronic hypoxic and hypercarbia respiratory failure. Multi-factorial in setting of possible HCAP vs aspiration +/- asymettric pulmonary edema in setting of severe AS and further c/b underlying COPD  ->her CXR shows new LLL airspace disease. ? Medication related sedation c/b aspiration event.  Plan   - agree w/ empiric abx, can likely simplify to PO  -Continue pulmicort and duonebs  -cont diuresis as BP, bun/creatinine allow  -flutter valve  -Solu-medrol 50 mg q 6 hours > recommend convert to pred on 3/31 and plan to taper -carefully resume her sedating meds  -d/c BiPAp -swallow eval -- has hx dysphagia per daughter  -mobilize   Acute Encephalopathy. Likely hypercarbia related, but family reports:  "last evening acted like she was drunk". So sounds as though she was a little encephalopathic then as well. Difficult to tell how much of this is was med related and how much was CO2 related.  Plan Careful with resuming clonazepam Cont supportive care   T6 and T8 compression fracture. (T8 is acute) Plan continue to re-address to ensure we meet her pain needs yet not over-sedate.   All other issues: severe AS, tachycardia & shoulder pain-->per IM service.  Should be ready to transfer to floor - should  not need BiPAP further  Please call if we can assist in any way  Baltazar Apo, MD, PhD 09/18/2015, 10:23 AM [facility] [phone] or if no answer [phone]

## 2015-09-18 NOTE — Progress Notes (Signed)
PROGRESS NOTE  Jill White [id number] DOB: 1941-04-02 DOA: 09/13/2015 PCP: Laurey Morale, MD  Brief History 75 year old female with history of hypertension, COPD, moderate aortic stenosis, tachycardia presented with increasing shortness of breath after developing new onset upper back pain. There was no history of trauma or injury. Pain in the upper back appeared to be worsening with deep inspiration and movement. The pain resulted in increasing shortness of breath. Initial chest x-ray was negative for acute findings. On the early morning of 09/16/2015, the patient developed respiratory distress and was placed on BiPAP. CCM was consulted and pt was started on IV steroids and abx. the patient improved clinically and was weaned off of BiPAP. She was subsequently transferred to ttelemetry unit, and her antibiotics were de-escalated. Assessment/Plan: Acute on chronic respiratory failure with hypoxia and hypercarbia -Secondary to HAP and CHF in the setting of severe aortic stenosis and COPD -pt had component of fluid overload with edema on CXR and peripheral edema-->furosemide per cardiology -09/16/15--appreciate pulmonary consult -continue IV steroids-->po prednisone on 3/31 -place pt on protonix for GI prophylaxis due to hx of GIB with prednisone -continue pulmicort and Duonebs -minimize opioid/hypnotic medications -speech therapy evaluation-->regular diet -granddaughter/family appears to have unrealistic expectations  HAP -vanco and cefepime started 3/29-->3/31 -09/18/15-->start levofloxacin (pt adamant she cannot tolerate po???) -procalcitonin <0.10 -lactic acid 0.6  T6 and T8 compression fracture -09/13/2015 CT angio chest negative for pulmonary embolus -09/14/2015 MRI thoracic spine acute T8 compression fracture -minimize opioid/hypnotic meds  Moderate to severe aortic stenosis -Appreciate cardiology follow-up -ultimately need evaluation for TAVR  Right shoulder  pain -Recent steroid injection 09/03/2015 -Ultrasound of the shoulder reveals subacromial bursitis, 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 -Symptomatically treatment-->voltaren gel -I have explained to the patient why we are trying to minimize opioids -pt refuses physical therapy  Tachycardia -Recent Holter monitor reveals brief runs of atrial tachycardia, sinus rhythm with Wenckebach AV block -Patient has chronic LBBB  Tobacco abuse -Cessation discussed -no desire to quit  Hyponatremia -likely due to CHF -continue lasix-->improving  Restless Leg Syndrome -restart lower dose klonopin 3/31  Family Communication:Granddaughter updated at beside 3/31 Disposition Plan: transfer to tele Total time 35 min; >50% spent counseling and coordinating care    Procedures/Studies: Dg Shoulder Right  09/15/2015  CLINICAL DATA:  Chronic right shoulder pain.  No known injury. EXAM: RIGHT SHOULDER - 2+ VIEW COMPARISON:  Chest CT 09/13/2015. FINDINGS: Moderate AC joint degenerative changes. Mild glenohumeral joint degenerative changes. No acute bony abnormality or worrisome bone lesion. The visualize right ribs are intact. The visualize right lung is clear. Stable emphysematous changes and pulmonary scarring. IMPRESSION: AC joint and glenohumeral joint degenerative changes but no acute bony findings. Electronically Signed   By: Marijo Sanes M.D.   On: 09/15/2015 14:12   Ct Angio Chest Pe W/cm &/or Wo Cm  09/13/2015  CLINICAL DATA:  75 year old female with shortness of breath and upper back pain. EXAM: CT ANGIOGRAPHY CHEST WITH CONTRAST TECHNIQUE: Multidetector CT imaging of the chest was performed using the standard protocol during bolus administration of intravenous contrast. Multiplanar CT image reconstructions and MIPs were obtained to evaluate the vascular anatomy. CONTRAST:  192mL OMNIPAQUE IOHEXOL 350 MG/ML SOLN COMPARISON:  Chest radiograph dated 09/13/2015 FINDINGS: There is emphysematous changes of  the lungs. No focal consolidation, pleural effusion, or pneumothorax. The central airways are patent. There is atherosclerotic calcification of the thoracic aorta. No CT evidence of pulmonary embolism. There is no cardiomegaly or  pericardial effusion. There is coronary vascular calcification. There is no hilar or mediastinal adenopathy. The esophagus and the thyroid gland appear unremarkable. There is no axillary adenopathy. The chest wall soft tissues appear unremarkable. There is osteopenia with degenerative changes of the spine. C6 compression fracture with anterior wedging, likely old. There is T8 compression fracture with mild sclerotic changes, age indeterminate, likely old. Clinical correlation is recommended. The visualized upper abdomen appears unremarkable. Review of the MIP images confirms the above findings. IMPRESSION: No CT evidence of pulmonary embolism. Osteopenia with multilevel compression fractures of the thoracic spine, age indeterminate, likely old. Clinical correlation is recommended. Electronically Signed   By: Anner Crete M.D.   On: 09/13/2015 23:43   Mr Thoracic Spine Wo Contrast  09/14/2015  CLINICAL DATA:  Initial evaluation for acute back pain EXAM: MRI THORACIC SPINE WITHOUT CONTRAST TECHNIQUE: Multiplanar, multisequence MR imaging of the thoracic spine was performed. No intravenous contrast was administered. COMPARISON:  Prior CT from 09/13/2015. FINDINGS: Limited views of the cervical spine demonstrate multilevel degenerative spondylolysis without severe stenosis. Vertebral bodies are normally aligned with preservation of the normal thoracic kyphosis. Chronic anterior wedging deformity of the T6 vertebral body with without significant retropulsion. There is abnormal T1 hypo intense, T2/STIR hyperintense signal intensity within the T8 vertebral body, consistent with acute compression fracture. Mild 30% of central height loss without bony retropulsion. No other acute or subacute  appearing compression fracture identified. No listhesis or malalignment. Signal intensity within the vertebral body bone marrow within normal limits. Small subcentimeter lesion with the T11 vertebral body noted, indeterminate, but may reflect a small hemangioma. Signal intensity within the visualized thoracic cord is normal. Please note that the distal cord is not visualized on this exam. No acute paraspinous soft tissue abnormality. Small layering bilateral pleural effusions noted. Heavy atheromatous plaque within the visualized aorta. Intraluminal fluid noted within the visualized esophagus . T1-2: Mild disc bulge and posterior element hypertrophy. No significant stenosis. T2-3: Mild disc bulge and posterior element hypertrophy. No significant stenosis. T3-4:  Mild broad-based disc bulge without significant stenosis. T4-5:  Minimal disc bulge without stenosis. T5-6:  Negative. T6-7: Mild bony retropulsion related to the chronic T6 vertebral body fracture. Superimposed tiny central disc protrusion. Minimal cord flattening without cord signal changes. Mild posterior element hypertrophy. Very mild canal stenosis. T7-T8:  Negative. T8-9:  Mild posterior element hypertrophy without stenosis. T9-10: Mild posterior element hypertrophy without stenosis. No significant disc bulge. T10-11: Posterior element hypertrophy without stenosis. No significant disc bulge. T11-12: Bilateral facet arthrosis without significant stenosis. No significant disc bulge. T12-L1: Seen only on sagittal projection. Disc bulge with reactive endplate changes. No significant stenosis. IMPRESSION: 1. Acute compression fracture involving the T8 vertebral body with associated mild 30% height loss without bony retropulsion. 2. No other acute abnormality within the thoracic spine. 3. Chronic compression deformity of T6 with resultant mild canal stenosis. 4. 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 Please see above report for a  full description these findings. 5. Small layering bilateral pleural effusions. Electronically Signed   By: Jeannine Boga M.D.   On: 09/14/2015 22:42   [place] Extrem Up Right Ltd  09/07/2015  MS[facility] US performed of: Right This study was ordered, performed, and interpreted by Charlann Boxer D.O. Shoulder:  Supraspinatus: Degenerative changes noted but no true acute tear. Patient does have bursal bulge. Underlying arthritic changes noted Infraspinatus: Appears normal on long and transverse views. Significant increase in Doppler flow Subscapularis: Degenerative changes noted. Positive bursa Teres Minor:  Appears normal on long and transverse views. AC joint: Moderate arthritis Glenohumeral Joint: Moderate arthritis. Glenoid Labrum: Intact without visualized tears. Biceps Tendon: Appears normal on long and transverse views, no fraying of tendon, tendon located in intertubercular groove, no subluxation with shoulder internal or external rotation. Impression: Subacromial bursitis, moderate to severe underlying arthritic changes. Procedure: Real-time Ultrasound Guided Injection of right glenohumeral joint Device: GE Logiq E  Ultrasound guided injection is preferred based studies that show increased duration, increased effect, greater accuracy, decreased procedural pain, increased response rate with ultrasound guided versus blind injection.  Verbal informed consent obtained.  Time-out conducted.  Noted no overlying erythema, induration, or other signs of local infection.  Skin prepped in a sterile fashion.  Local anesthesia: Topical Ethyl chloride.  With sterile technique and under real time ultrasound guidance: Joint visualized. 23g 1  inch needle inserted posterior approach. Pictures taken for needle placement. Patient did have injection of 2 cc of 1% lidocaine, 2 cc of 0.5% Marcaine, and 1.0 cc of Kenalog 40 mg/dL. Completed without difficulty  Pain immediately resolved suggesting accurate placement of  the medication.  Advised to call if fevers/chills, erythema, induration, drainage, or persistent bleeding.  Images permanently stored and available for review in the ultrasound unit.  Impression: Technically successful ultrasound guided injection.  Dg Chest Port 1 View  09/16/2015  CLINICAL DATA:  Acute onset of shortness of breath and decreased O2 saturation. Initial encounter. EXAM: PORTABLE CHEST 1 VIEW COMPARISON:  Chest radiograph and CTA of the chest performed 09/13/2015 FINDINGS: New left basilar airspace opacity is concerning for pneumonia. Increased vascular congestion is noted, and superimposed interstitial edema cannot be excluded. No definite pleural effusion or pneumothorax is seen. The cardiomediastinal silhouette is borderline enlarged. No acute osseous abnormalities are identified. IMPRESSION: New left basilar airspace opacity is concerning for pneumonia. Increased vascular congestion noted, with borderline cardiomegaly. Superimposed interstitial edema cannot be excluded. Electronically Signed   By: Garald Balding M.D.   On: 09/16/2015 04:44   Dg Chest Portable 1 View  09/13/2015  CLINICAL DATA:  Shortness of breath, bilateral lower extremity edema EXAM: PORTABLE CHEST 1 VIEW COMPARISON:  06/23/2015 FINDINGS: Increased interstitial markings, chronic. Mild cephalization after. This appearance suggests chronic compensated CHF without frank interstitial edema. No pleural effusion or pneumothorax. The heart is top-normal in size. IMPRESSION: Chronic interstitial markings.  No frank interstitial edema. No definite pleural effusions. Electronically Signed   By: Julian Hy M.D.   On: 09/13/2015 16:34         Subjective: Patient is feeling better overall. Denies any fevers, chills, chest pain, nausea, vomiting, diarrhea, abdominal pain. No dysuria or hematuria. Denies any headache or neck pain.  Objective: Filed Vitals:   09/18/15 0854 09/18/15 0855 09/18/15 1130 09/18/15 1224    BP:   142/67   Pulse:      Temp:   97.8 F (36.6 C)   TempSrc:   Oral   Resp:   26   Height:      Weight:      SpO2: 92% 94% 86% 88%    Intake/Output Summary (Last 24 hours) at 09/18/15 1335 Last data filed at 09/18/15 1217  Gross per 24 hour  Intake    760 ml  Output   1800 ml  Net  -1040 ml   Weight change: 2.6 kg (5 lb 11.7 oz) Exam:   General:  Pt is alert, follows commands appropriately, not in acute distress  HEENT: No icterus, No thrush, No neck  mass, [place]/AT  Cardiovascular: RRR, S1/S2, no rubs, no gallops  Respiratory: diminished breath sounds bilateral. Bilateral rales.  No wheeze  Abdomen: Soft/+BS, non tender, non distended, no guarding; nohepatosplenomegaly  Extremities: No edema, No lymphangitis, No petechiae, No rashes, no synovitis; no cyanosis  Data Reviewed: Basic Metabolic Panel:  Recent Labs Lab 09/13/15 1636 09/14/15 0457 09/14/15 1000 09/16/15 0434 09/17/15 0314 09/18/15 0316  NA 137 139  --  129* 130* 132*  K 3.5 4.1  --  4.3 4.6 3.9  CL 91* 90*  --  83* 84* 83*  CO2 35* 37*  --  37* 39* 39*  GLUCOSE 102* 84  --  139* 146* 140*  BUN 10 14  --  12 14 15   CREATININE 0.44 0.69  --  0.37* 0.42* 0.43*  CALCIUM 9.3 9.3  --  8.8* 8.8* 8.8*  MG  --   --  1.9  --   --   --    Liver Function Tests:  Recent Labs Lab 09/13/15 1636  AST 21  ALT 20  ALKPHOS 65  BILITOT 1.1  PROT 7.1  ALBUMIN 4.0   No results for input(s): LIPASE, AMYLASE in the last 168 hours. No results for input(s): AMMONIA in the last 168 hours. CBC:  Recent Labs Lab 09/14/15 0457 09/16/15 0433 09/16/15 0434 09/17/15 0314 09/18/15 0316  WBC 11.8* 14.1* 14.2* 8.5 10.9*  NEUTROABS  --  12.0*  --   --   --   HGB 14.1 13.6 13.5 11.7* 11.9*  HCT 44.1 41.6 41.7 35.9* 35.7*  MCV 96.5 95.6 95.6 96.2 93.9  PLT 328 279 283 241 258   Cardiac Enzymes:  Recent Labs Lab 09/13/15 2228 09/14/15 0457 09/14/15 1000  TROPONINI 0.06* 0.03 0.03   BNP: Invalid  input(s): POCBNP CBG: No results for input(s): GLUCAP in the last 168 hours.  Recent Results (from the past 240 hour(s))  MRSA PCR Screening     Status: None   Collection Time: 09/16/15  8:13 PM  Result Value Ref Range Status   MRSA by PCR NEGATIVE NEGATIVE Final    Comment:        The GeneXpert MRSA Assay (FDA approved for NASAL specimens only), is one component of a comprehensive MRSA colonization surveillance program. It is not intended to diagnose MRSA infection nor to guide or monitor treatment for MRSA infections.      Scheduled Meds: . antiseptic oral rinse  7 mL Mouth Rinse q12n4p  . aspirin  81 mg Oral QODAY  . budesonide (PULMICORT) nebulizer solution  0.25 mg Nebulization BID  . chlorhexidine  15 mL Mouth Rinse BID  . diclofenac sodium  2 g Topical QID  . diltiazem  120 mg Oral QHS  . enoxaparin (LOVENOX) injection  40 mg Subcutaneous Q24H  . furosemide  40 mg Oral Daily  . guaiFENesin  600 mg Oral BID  . ipratropium-albuterol  3 mL Nebulization QID  . levofloxacin (LEVAQUIN) IV  750 mg Intravenous Q24H  . Living Better with Heart Failure Book   Does not apply Once  . nicotine  14 mg Transdermal Daily  . pantoprazole  40 mg Oral Daily  . predniSONE  50 mg Oral BID WC  . sodium chloride flush  3 mL Intravenous Q12H   Continuous Infusions:    Jamal Haskin, DO  [facility] Pager [phone]  If 7PM-7AM, please contact night-coverage [email or web address] Password [email or web address] 09/18/2015, 1:35 PM   LOS: 5 days

## 2015-09-18 NOTE — Care Management Note (Signed)
Case Management Note  Patient Details  Name: Jill White MRN: [id number] Date of Birth: 13-Aug-1940  Subjective/Objective: 75 y/o f admitted w/resp failure. From home alone. Would recc PT cons when appropriate.                   Action/Plan:d/c plan home.   Expected Discharge Date:                  Expected Discharge Plan:  [place]  In-House Referral:     Discharge planning Services  CM Consult  Post Acute Care Choice:    Choice offered to:     DME Arranged:    DME Agency:     HH Arranged:    [place] Agency:     Status of Service:  In process, will continue to follow  Medicare Important Message Given:    Date Medicare IM Given:    Medicare IM give by:    Date Additional Medicare IM Given:    Additional Medicare Important Message give by:     If discussed at [place] of Stay Meetings, dates discussed:    Additional Comments:  Dessa Phi, RN 09/18/2015, 1:31 PM

## 2015-09-18 NOTE — Progress Notes (Signed)
Pt complaining of a "panic attack". Pt placed in chair and O2 sats are 87/88% on 3L HFNC. Adamantly refused the chair alarm and this RN didn't give her a choice. Pt sitting on chair alarm pad and comfortable. Will continue to monitor.

## 2015-09-19 LAB — CBC
HEMATOCRIT: 40.2 % (ref 36.0–46.0)
HEMOGLOBIN: 13 g/dL (ref 12.0–15.0)
MCH: 31.3 pg (ref 26.0–34.0)
MCHC: 32.3 g/dL (ref 30.0–36.0)
MCV: 96.6 fL (ref 78.0–100.0)
Platelets: 279 10*3/uL (ref 150–400)
RBC: 4.16 MIL/uL (ref 3.87–5.11)
RDW: 14.4 % (ref 11.5–15.5)
WBC: 10.8 10*3/uL — ABNORMAL HIGH (ref 4.0–10.5)

## 2015-09-19 LAB — BASIC METABOLIC PANEL
ANION GAP: 8 (ref 5–15)
BUN: 13 mg/dL (ref 6–20)
CHLORIDE: 84 mmol/L — AB (ref 101–111)
CO2: 45 mmol/L — AB (ref 22–32)
Calcium: 9.1 mg/dL (ref 8.9–10.3)
Creatinine, Ser: 0.41 mg/dL — ABNORMAL LOW (ref 0.44–1.00)
GFR calc Af Amer: >60 mL/min (ref >60)
GFR calc non Af Amer: >60 mL/min (ref >60)
GLUCOSE: 115 mg/dL — AB (ref 65–99)
POTASSIUM: 3.6 mmol/L (ref 3.5–5.1)
Sodium: 137 mmol/L (ref 135–145)

## 2015-09-19 MED ORDER — METHYLPREDNISOLONE SODIUM SUCC 125 MG IJ SOLR
60.0000 mg | INTRAMUSCULAR | Status: DC
  Administered 2015-09-20 – 2015-09-21 (×2): 60 mg via INTRAVENOUS
  Filled 2015-09-19 (×2): qty 2

## 2015-09-19 MED ORDER — LORAZEPAM 0.5 MG PO TABS
0.5000 mg | ORAL_TABLET | Freq: Once | ORAL | Status: AC
  Administered 2015-09-19: 0.5 mg via ORAL
  Filled 2015-09-19: qty 1

## 2015-09-19 NOTE — Progress Notes (Signed)
Pt c/o having a panic attack at 0930. Called RT and informed them that the patient was requesting her nebulizer and emergency inhaler. RT was able to come right away and administer the correct medications. Post medication delivery patient reports that she is feeling better.   Respirations upon assessment @ 0930 were 28 (shallow and labored). Patient appeared pale and drowsy.   After medication delivery patient was more alert respirations slowed and color appears more pink.

## 2015-09-19 NOTE — Progress Notes (Signed)
Resumed care of patient. Agree with previous assessment. Orders reviewed and will continue to monitor.

## 2015-09-19 NOTE — Progress Notes (Signed)
PROGRESS NOTE  Jill White [id number] DOB: 11/01/1940 DOA: 09/13/2015 PCP: Laurey Morale, MD  Brief History 75 year old female with history of hypertension, COPD, moderate aortic stenosis, tachycardia presented with increasing shortness of breath after developing new onset upper back pain. There was no history of trauma or injury. Pain in the upper back appeared to be worsening with deep inspiration and movement. The pain resulted in increasing shortness of breath. Initial chest x-ray was negative for acute findings. On the early morning of 09/16/2015, the patient developed respiratory distress and was placed on BiPAP. CCM was consulted and pt was started on IV steroids and abx. the patient improved clinically and was weaned off of BiPAP. She was subsequently transferred to telemetry unit, and her antibiotics were de-escalated. Assessment/Plan: Acute on chronic respiratory failure with hypoxia and hypercarbia -Secondary to HAP and CHF in the setting of severe aortic stenosis and COPD -pt had component of fluid overload with edema on CXR and peripheral edema-->furosemide per cardiology -09/16/15--appreciate pulmonary consult -continue IV steroids-->po prednisone on 3/31 -place pt on protonix for GI prophylaxis due to hx of GIB with prednisone -continue pulmicort and Duonebs -minimize opioid/hypnotic medications -speech therapy evaluation-->regular diet -09/20/15--repeat cxr -granddaughter/family appears to have unrealistic expectations  HAP -vanco and cefepime started 3/29-->3/31 -09/18/15-->start levofloxacin (pt adamant she cannot tolerate po???) -procalcitonin <0.10 -lactic acid 0.6  COPD exacerbation -pt is adamant she cannot take prednisone although "GI bleed" is the listed allergy -continue solu-medrol, decrease to once daily and monitor clinically   T6 and T8 compression fracture -09/13/2015 CT angio chest negative for pulmonary embolus -09/14/2015 MRI thoracic  spine acute T8 compression fracture -minimize opioid/hypnotic meds  Moderate to severe aortic stenosis -Appreciate cardiology follow-up -ultimately need evaluation for TAVR  Right shoulder pain -Recent steroid injection 09/03/2015 -Ultrasound of the shoulder reveals subacromial bursitis, 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 -Symptomatically treatment-->voltaren gel -I have explained to the patient why we are trying to minimize opioids -pt refuses physical therapy  Tachycardia -Recent Holter monitor reveals brief runs of atrial tachycardia, sinus rhythm with Wenckebach AV block -Patient has chronic LBBB  Tobacco abuse -Cessation discussed -no desire to quit  Hyponatremia -likely due to CHF -continue lasix-->improving  Restless Leg Syndrome -restart lower dose klonopin 3/31  Family Communication:Granddaughter updated at beside 3/31 Disposition Plan: transfer to tele      Procedures/Studies: Dg Shoulder Right  09/15/2015  CLINICAL DATA:  Chronic right shoulder pain.  No known injury. EXAM: RIGHT SHOULDER - 2+ VIEW COMPARISON:  Chest CT 09/13/2015. FINDINGS: Moderate AC joint degenerative changes. Mild glenohumeral joint degenerative changes. No acute bony abnormality or worrisome bone lesion. The visualize right ribs are intact. The visualize right lung is clear. Stable emphysematous changes and pulmonary scarring. IMPRESSION: AC joint and glenohumeral joint degenerative changes but no acute bony findings. Electronically Signed   By: Marijo Sanes M.D.   On: 09/15/2015 14:12   Ct Angio Chest Pe W/cm &/or Wo Cm  09/13/2015  CLINICAL DATA:  75 year old female with shortness of breath and upper back pain. EXAM: CT ANGIOGRAPHY CHEST WITH CONTRAST TECHNIQUE: Multidetector CT imaging of the chest was performed using the standard protocol during bolus administration of intravenous contrast. Multiplanar CT image reconstructions and MIPs were obtained to evaluate the vascular anatomy.  CONTRAST:  178mL OMNIPAQUE IOHEXOL 350 MG/ML SOLN COMPARISON:  Chest radiograph dated 09/13/2015 FINDINGS: There is emphysematous changes of the lungs. No focal consolidation, pleural effusion, or pneumothorax. The central airways  are patent. There is atherosclerotic calcification of the thoracic aorta. No CT evidence of pulmonary embolism. There is no cardiomegaly or pericardial effusion. There is coronary vascular calcification. There is no hilar or mediastinal adenopathy. The esophagus and the thyroid gland appear unremarkable. There is no axillary adenopathy. The chest wall soft tissues appear unremarkable. There is osteopenia with degenerative changes of the spine. C6 compression fracture with anterior wedging, likely old. There is T8 compression fracture with mild sclerotic changes, age indeterminate, likely old. Clinical correlation is recommended. The visualized upper abdomen appears unremarkable. Review of the MIP images confirms the above findings. IMPRESSION: No CT evidence of pulmonary embolism. Osteopenia with multilevel compression fractures of the thoracic spine, age indeterminate, likely old. Clinical correlation is recommended. Electronically Signed   By: Anner Crete M.D.   On: 09/13/2015 23:43   Mr Thoracic Spine Wo Contrast  09/14/2015  CLINICAL DATA:  Initial evaluation for acute back pain EXAM: MRI THORACIC SPINE WITHOUT CONTRAST TECHNIQUE: Multiplanar, multisequence MR imaging of the thoracic spine was performed. No intravenous contrast was administered. COMPARISON:  Prior CT from 09/13/2015. FINDINGS: Limited views of the cervical spine demonstrate multilevel degenerative spondylolysis without severe stenosis. Vertebral bodies are normally aligned with preservation of the normal thoracic kyphosis. Chronic anterior wedging deformity of the T6 vertebral body with without significant retropulsion. There is abnormal T1 hypo intense, T2/STIR hyperintense signal intensity within the T8  vertebral body, consistent with acute compression fracture. Mild 30% of central height loss without bony retropulsion. No other acute or subacute appearing compression fracture identified. No listhesis or malalignment. Signal intensity within the vertebral body bone marrow within normal limits. Small subcentimeter lesion with the T11 vertebral body noted, indeterminate, but may reflect a small hemangioma. Signal intensity within the visualized thoracic cord is normal. Please note that the distal cord is not visualized on this exam. No acute paraspinous soft tissue abnormality. Small layering bilateral pleural effusions noted. Heavy atheromatous plaque within the visualized aorta. Intraluminal fluid noted within the visualized esophagus . T1-2: Mild disc bulge and posterior element hypertrophy. No significant stenosis. T2-3: Mild disc bulge and posterior element hypertrophy. No significant stenosis. T3-4:  Mild broad-based disc bulge without significant stenosis. T4-5:  Minimal disc bulge without stenosis. T5-6:  Negative. T6-7: Mild bony retropulsion related to the chronic T6 vertebral body fracture. Superimposed tiny central disc protrusion. Minimal cord flattening without cord signal changes. Mild posterior element hypertrophy. Very mild canal stenosis. T7-T8:  Negative. T8-9:  Mild posterior element hypertrophy without stenosis. T9-10: Mild posterior element hypertrophy without stenosis. No significant disc bulge. T10-11: Posterior element hypertrophy without stenosis. No significant disc bulge. T11-12: Bilateral facet arthrosis without significant stenosis. No significant disc bulge. T12-L1: Seen only on sagittal projection. Disc bulge with reactive endplate changes. No significant stenosis. IMPRESSION: 1. Acute compression fracture involving the T8 vertebral body with associated mild 30% height loss without bony retropulsion. 2. No other acute abnormality within the thoracic spine. 3. Chronic compression  deformity of T6 with resultant mild canal stenosis. 4. 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 Please see above report for a full description these findings. 5. Small layering bilateral pleural effusions. Electronically Signed   By: Jeannine Boga M.D.   On: 09/14/2015 22:42   [place] Extrem Up Right Ltd  09/07/2015  [facility] US performed of: Right This study was ordered, performed, and interpreted by Charlann Boxer D.O. Shoulder:  Supraspinatus: Degenerative changes noted but no true acute tear. Patient does have bursal bulge. Underlying arthritic changes noted  Infraspinatus: Appears normal on long and transverse views. Significant increase in Doppler flow Subscapularis: Degenerative changes noted. Positive bursa Teres Minor: Appears normal on long and transverse views. AC joint: Moderate arthritis Glenohumeral Joint: Moderate arthritis. Glenoid Labrum: Intact without visualized tears. Biceps Tendon: Appears normal on long and transverse views, no fraying of tendon, tendon located in intertubercular groove, no subluxation with shoulder internal or external rotation. Impression: Subacromial bursitis, moderate to severe underlying arthritic changes. Procedure: Real-time Ultrasound Guided Injection of right glenohumeral joint Device: GE Logiq E  Ultrasound guided injection is preferred based studies that show increased duration, increased effect, greater accuracy, decreased procedural pain, increased response rate with ultrasound guided versus blind injection.  Verbal informed consent obtained.  Time-out conducted.  Noted no overlying erythema, induration, or other signs of local infection.  Skin prepped in a sterile fashion.  Local anesthesia: Topical Ethyl chloride.  With sterile technique and under real time ultrasound guidance: Joint visualized. 23g 1  inch needle inserted posterior approach. Pictures taken for needle placement. Patient did have injection of 2 cc of 1%  lidocaine, 2 cc of 0.5% Marcaine, and 1.0 cc of Kenalog 40 mg/dL. Completed without difficulty  Pain immediately resolved suggesting accurate placement of the medication.  Advised to call if fevers/chills, erythema, induration, drainage, or persistent bleeding.  Images permanently stored and available for review in the ultrasound unit.  Impression: Technically successful ultrasound guided injection.  Dg Chest Port 1 View  09/16/2015  CLINICAL DATA:  Acute onset of shortness of breath and decreased O2 saturation. Initial encounter. EXAM: PORTABLE CHEST 1 VIEW COMPARISON:  Chest radiograph and CTA of the chest performed 09/13/2015 FINDINGS: New left basilar airspace opacity is concerning for pneumonia. Increased vascular congestion is noted, and superimposed interstitial edema cannot be excluded. No definite pleural effusion or pneumothorax is seen. The cardiomediastinal silhouette is borderline enlarged. No acute osseous abnormalities are identified. IMPRESSION: New left basilar airspace opacity is concerning for pneumonia. Increased vascular congestion noted, with borderline cardiomegaly. Superimposed interstitial edema cannot be excluded. Electronically Signed   By: Garald Balding M.D.   On: 09/16/2015 04:44   Dg Chest Portable 1 View  09/13/2015  CLINICAL DATA:  Shortness of breath, bilateral lower extremity edema EXAM: PORTABLE CHEST 1 VIEW COMPARISON:  06/23/2015 FINDINGS: Increased interstitial markings, chronic. Mild cephalization after. This appearance suggests chronic compensated CHF without frank interstitial edema. No pleural effusion or pneumothorax. The heart is top-normal in size. IMPRESSION: Chronic interstitial markings.  No frank interstitial edema. No definite pleural effusions. Electronically Signed   By: Julian Hy M.D.   On: 09/13/2015 16:34         Subjective: Patient states that she feels a little short of breath today. Denies any nausea, vomiting, diarrhea, abdominal  pain, dysuria, hematuria. Complains of nonproductive cough. No headache or neck pain.  Objective: Filed Vitals:   09/19/15 0942 09/19/15 1315 09/19/15 1350 09/19/15 1702  BP:  134/57    Pulse: 89 99 95   Temp:  98 F (36.7 C)    TempSrc:  Oral    Resp: 24 18 22    Height:      Weight:      SpO2: 92% 90% 94% 93%    Intake/Output Summary (Last 24 hours) at 09/19/15 1746 Last data filed at 09/19/15 1627  Gross per 24 hour  Intake      3 ml  Output    900 ml  Net   -897 ml   Weight change: -4.416 kg (-  9 lb 11.8 oz) Exam:   General:  Pt is alert, follows commands appropriately, not in acute distress  HEENT: No icterus, No thrush, No neck mass, [place]/AT  Cardiovascular: RRR, S1/S2, no rubs, no gallops  Respiratory: bibasilar rhonchi.  No wheezing. Good air movement.  Abdomen: Soft/+BS, non tender, non distended, no guarding  Extremities: No edema, No lymphangitis, No petechiae, No rashes, no synovitis  Data Reviewed: Basic Metabolic Panel:  Recent Labs Lab 09/14/15 0457 09/14/15 1000 09/16/15 0434 09/17/15 0314 09/18/15 0316 09/19/15 0521  NA 139  --  129* 130* 132* 137  K 4.1  --  4.3 4.6 3.9 3.6  CL 90*  --  83* 84* 83* 84*  CO2 37*  --  37* 39* 39* 45*  GLUCOSE 84  --  139* 146* 140* 115*  BUN 14  --  12 14 15 13   CREATININE 0.69  --  0.37* 0.42* 0.43* 0.41*  CALCIUM 9.3  --  8.8* 8.8* 8.8* 9.1  MG  --  1.9  --   --   --   --    Liver Function Tests:  Recent Labs Lab 09/13/15 1636  AST 21  ALT 20  ALKPHOS 65  BILITOT 1.1  PROT 7.1  ALBUMIN 4.0   No results for input(s): LIPASE, AMYLASE in the last 168 hours. No results for input(s): AMMONIA in the last 168 hours. CBC:  Recent Labs Lab 09/16/15 0433 09/16/15 0434 09/17/15 0314 09/18/15 0316 09/19/15 0521  WBC 14.1* 14.2* 8.5 10.9* 10.8*  NEUTROABS 12.0*  --   --   --   --   HGB 13.6 13.5 11.7* 11.9* 13.0  HCT 41.6 41.7 35.9* 35.7* 40.2  MCV 95.6 95.6 96.2 93.9 96.6  PLT 279 283 241 258  279   Cardiac Enzymes:  Recent Labs Lab 09/13/15 2228 09/14/15 0457 09/14/15 1000  TROPONINI 0.06* 0.03 0.03   BNP: Invalid input(s): POCBNP CBG: No results for input(s): GLUCAP in the last 168 hours.  Recent Results (from the past 240 hour(s))  MRSA PCR Screening     Status: None   Collection Time: 09/16/15  8:13 PM  Result Value Ref Range Status   MRSA by PCR NEGATIVE NEGATIVE Final    Comment:        The GeneXpert MRSA Assay (FDA approved for NASAL specimens only), is one component of a comprehensive MRSA colonization surveillance program. It is not intended to diagnose MRSA infection nor to guide or monitor treatment for MRSA infections.      Scheduled Meds: . antiseptic oral rinse  7 mL Mouth Rinse q12n4p  . aspirin  81 mg Oral QODAY  . budesonide (PULMICORT) nebulizer solution  0.25 mg Nebulization BID  . chlorhexidine  15 mL Mouth Rinse BID  . clonazePAM  0.5 mg Oral QHS  . diclofenac sodium  2 g Topical QID  . diltiazem  120 mg Oral QHS  . enoxaparin (LOVENOX) injection  40 mg Subcutaneous Q24H  . furosemide  40 mg Oral Daily  . guaiFENesin  600 mg Oral BID  . ipratropium-albuterol  3 mL Nebulization QID  . levofloxacin (LEVAQUIN) IV  750 mg Intravenous Q24H  . Living Better with Heart Failure Book   Does not apply Once  . [START ON 09/20/2015] methylPREDNISolone (SOLU-MEDROL) injection  60 mg Intravenous Q24H  . nicotine  14 mg Transdermal Daily  . pantoprazole  40 mg Oral Daily  . sodium chloride flush  3 mL Intravenous Q12H   Continuous  Infusions:    Wilburt Messina, DO  [facility] Pager [phone]  If 7PM-7AM, please contact night-coverage [email or web address] Password [email or web address] 09/19/2015, 5:46 PM   LOS: 6 days

## 2015-09-20 ENCOUNTER — Inpatient Hospital Stay (HOSPITAL_COMMUNITY): Payer: Medicare Other

## 2015-09-20 ENCOUNTER — Other Ambulatory Visit: Payer: Self-pay | Admitting: Internal Medicine

## 2015-09-20 LAB — BASIC METABOLIC PANEL
BUN: 15 mg/dL (ref 6–20)
CALCIUM: 9.1 mg/dL (ref 8.9–10.3)
CO2: >50 mmol/L — ABNORMAL HIGH (ref 22–32)
CREATININE: 0.4 mg/dL — AB (ref 0.44–1.00)
Chloride: 81 mmol/L — ABNORMAL LOW (ref 101–111)
GFR calc Af Amer: >60 mL/min (ref >60)
GLUCOSE: 126 mg/dL — AB (ref 65–99)
POTASSIUM: 3.2 mmol/L — AB (ref 3.5–5.1)
SODIUM: 139 mmol/L (ref 135–145)

## 2015-09-20 LAB — PROCALCITONIN

## 2015-09-20 MED ORDER — POTASSIUM CHLORIDE 10 MEQ/100ML IV SOLN
10.0000 meq | INTRAVENOUS | Status: AC
  Administered 2015-09-20 (×2): 10 meq via INTRAVENOUS
  Filled 2015-09-20 (×2): qty 100

## 2015-09-20 NOTE — Progress Notes (Signed)
PROGRESS NOTE  Jill White [id number] DOB: 1941/01/25 DOA: 09/13/2015 PCP: Laurey Morale, MD   Brief History 75 year old female with history of hypertension, COPD, moderate aortic stenosis, tachycardia presented with increasing shortness of breath after developing new onset upper back pain. There was no history of trauma or injury. Pain in the upper back appeared to be worsening with deep inspiration and movement. The pain resulted in increasing shortness of breath. Initial chest x-ray was negative for acute findings. On the early morning of 09/16/2015, the patient developed respiratory distress and was placed on BiPAP. CCM was consulted and pt was started on IV steroids and abx. the patient improved clinically and was weaned off of BiPAP. She was subsequently transferred to telemetry unit, and her antibiotics were de-escalated. Assessment/Plan: Acute on chronic respiratory failure with hypoxia and hypercarbia -Secondary to HAP and CHF in the setting of severe aortic stenosis and COPD -pt had component of fluid overload with edema on CXR and peripheral edema-->furosemide per cardiology -09/16/15--appreciate pulmonary consult -continue IV steroids-->po prednisone on 3/31-pt adamantly refuses po prednisone as it caused GIB, but willing to take IV steroids and -continue pulmicort and Duonebs -minimize opioid/hypnotic medications -speech therapy evaluation-->regular diet -09/20/15--repeat cxr--improving but persistent infiltrates -granddaughter/family appears to have unrealistic expectations  HAP -vanco and cefepime started 3/29-->3/31 -09/18/15-->start levofloxacin (pt adamant she cannot tolerate po???) -procalcitonin <0.10 -lactic acid 0.6  COPD exacerbation -pt is adamant she cannot take prednisone although "GI bleed" is the listed allergy -continue solu-medrol, decrease to once daily and monitor clinically   T6 and T8 compression fracture -09/13/2015 CT angio chest  negative for pulmonary embolus -09/14/2015 MRI thoracic spine acute T8 compression fracture -minimize opioid/hypnotic meds  Moderate to severe aortic stenosis -Appreciate cardiology follow-up -ultimately need evaluation for TAVR  Right shoulder pain -Recent steroid injection 09/03/2015 -Ultrasound of the shoulder reveals subacromial bursitis, 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 -Symptomatically treatment-->voltaren gel -I have explained to the patient why we are trying to minimize opioids -pt refuses physical therapy  Tachycardia -Recent Holter monitor reveals brief runs of atrial tachycardia, sinus rhythm with Wenckebach AV block -Patient has chronic LBBB  Tobacco abuse -Cessation discussed -no desire to quit  Hyponatremia -likely due to CHF -continue lasix-->improving  Restless Leg Syndrome -restart lower dose klonopin 3/31  Family Communication:Granddaughter updated at beside 3/31 Disposition Plan: transfer to tele   Procedures/Studies: Dg Shoulder Right  09/15/2015  CLINICAL DATA:  Chronic right shoulder pain.  No known injury. EXAM: RIGHT SHOULDER - 2+ VIEW COMPARISON:  Chest CT 09/13/2015. FINDINGS: Moderate AC joint degenerative changes. Mild glenohumeral joint degenerative changes. No acute bony abnormality or worrisome bone lesion. The visualize right ribs are intact. The visualize right lung is clear. Stable emphysematous changes and pulmonary scarring. IMPRESSION: AC joint and glenohumeral joint degenerative changes but no acute bony findings. Electronically Signed   By: Marijo Sanes M.D.   On: 09/15/2015 14:12   Ct Angio Chest Pe W/cm &/or Wo Cm  09/13/2015  CLINICAL DATA:  75 year old female with shortness of breath and upper back pain. EXAM: CT ANGIOGRAPHY CHEST WITH CONTRAST TECHNIQUE: Multidetector CT imaging of the chest was performed using the standard protocol during bolus administration of intravenous contrast. Multiplanar CT image reconstructions and MIPs  were obtained to evaluate the vascular anatomy. CONTRAST:  162mL OMNIPAQUE IOHEXOL 350 MG/ML SOLN COMPARISON:  Chest radiograph dated 09/13/2015 FINDINGS: There is emphysematous changes of the lungs. No focal consolidation, pleural effusion, or pneumothorax. The  central airways are patent. There is atherosclerotic calcification of the thoracic aorta. No CT evidence of pulmonary embolism. There is no cardiomegaly or pericardial effusion. There is coronary vascular calcification. There is no hilar or mediastinal adenopathy. The esophagus and the thyroid gland appear unremarkable. There is no axillary adenopathy. The chest wall soft tissues appear unremarkable. There is osteopenia with degenerative changes of the spine. C6 compression fracture with anterior wedging, likely old. There is T8 compression fracture with mild sclerotic changes, age indeterminate, likely old. Clinical correlation is recommended. The visualized upper abdomen appears unremarkable. Review of the MIP images confirms the above findings. IMPRESSION: No CT evidence of pulmonary embolism. Osteopenia with multilevel compression fractures of the thoracic spine, age indeterminate, likely old. Clinical correlation is recommended. Electronically Signed   By: Anner Crete M.D.   On: 09/13/2015 23:43   Mr Thoracic Spine Wo Contrast  09/14/2015  CLINICAL DATA:  Initial evaluation for acute back pain EXAM: MRI THORACIC SPINE WITHOUT CONTRAST TECHNIQUE: Multiplanar, multisequence MR imaging of the thoracic spine was performed. No intravenous contrast was administered. COMPARISON:  Prior CT from 09/13/2015. FINDINGS: Limited views of the cervical spine demonstrate multilevel degenerative spondylolysis without severe stenosis. Vertebral bodies are normally aligned with preservation of the normal thoracic kyphosis. Chronic anterior wedging deformity of the T6 vertebral body with without significant retropulsion. There is abnormal T1 hypo intense, T2/STIR  hyperintense signal intensity within the T8 vertebral body, consistent with acute compression fracture. Mild 30% of central height loss without bony retropulsion. No other acute or subacute appearing compression fracture identified. No listhesis or malalignment. Signal intensity within the vertebral body bone marrow within normal limits. Small subcentimeter lesion with the T11 vertebral body noted, indeterminate, but may reflect a small hemangioma. Signal intensity within the visualized thoracic cord is normal. Please note that the distal cord is not visualized on this exam. No acute paraspinous soft tissue abnormality. Small layering bilateral pleural effusions noted. Heavy atheromatous plaque within the visualized aorta. Intraluminal fluid noted within the visualized esophagus . T1-2: Mild disc bulge and posterior element hypertrophy. No significant stenosis. T2-3: Mild disc bulge and posterior element hypertrophy. No significant stenosis. T3-4:  Mild broad-based disc bulge without significant stenosis. T4-5:  Minimal disc bulge without stenosis. T5-6:  Negative. T6-7: Mild bony retropulsion related to the chronic T6 vertebral body fracture. Superimposed tiny central disc protrusion. Minimal cord flattening without cord signal changes. Mild posterior element hypertrophy. Very mild canal stenosis. T7-T8:  Negative. T8-9:  Mild posterior element hypertrophy without stenosis. T9-10: Mild posterior element hypertrophy without stenosis. No significant disc bulge. T10-11: Posterior element hypertrophy without stenosis. No significant disc bulge. T11-12: Bilateral facet arthrosis without significant stenosis. No significant disc bulge. T12-L1: Seen only on sagittal projection. Disc bulge with reactive endplate changes. No significant stenosis. IMPRESSION: 1. Acute compression fracture involving the T8 vertebral body with associated mild 30% height loss without bony retropulsion. 2. No other acute abnormality within the  thoracic spine. 3. Chronic compression deformity of T6 with resultant mild canal stenosis. 4. 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 Please see above report for a full description these findings. 5. Small layering bilateral pleural effusions. Electronically Signed   By: Jeannine Boga M.D.   On: 09/14/2015 22:42   [place] Extrem Up Right Ltd  09/07/2015  [facility] US performed of: Right This study was ordered, performed, and interpreted by Charlann Boxer D.O. Shoulder:  Supraspinatus: Degenerative changes noted but no true acute tear. Patient does have bursal bulge. Underlying arthritic  changes noted Infraspinatus: Appears normal on long and transverse views. Significant increase in Doppler flow Subscapularis: Degenerative changes noted. Positive bursa Teres Minor: Appears normal on long and transverse views. AC joint: Moderate arthritis Glenohumeral Joint: Moderate arthritis. Glenoid Labrum: Intact without visualized tears. Biceps Tendon: Appears normal on long and transverse views, no fraying of tendon, tendon located in intertubercular groove, no subluxation with shoulder internal or external rotation. Impression: Subacromial bursitis, moderate to severe underlying arthritic changes. Procedure: Real-time Ultrasound Guided Injection of right glenohumeral joint Device: GE Logiq E  Ultrasound guided injection is preferred based studies that show increased duration, increased effect, greater accuracy, decreased procedural pain, increased response rate with ultrasound guided versus blind injection.  Verbal informed consent obtained.  Time-out conducted.  Noted no overlying erythema, induration, or other signs of local infection.  Skin prepped in a sterile fashion.  Local anesthesia: Topical Ethyl chloride.  With sterile technique and under real time ultrasound guidance: Joint visualized. 23g 1  inch needle inserted posterior approach. Pictures taken for needle placement.  Patient did have injection of 2 cc of 1% lidocaine, 2 cc of 0.5% Marcaine, and 1.0 cc of Kenalog 40 mg/dL. Completed without difficulty  Pain immediately resolved suggesting accurate placement of the medication.  Advised to call if fevers/chills, erythema, induration, drainage, or persistent bleeding.  Images permanently stored and available for review in the ultrasound unit.  Impression: Technically successful ultrasound guided injection.  Dg Chest Port 1 View  09/20/2015  CLINICAL DATA:  Shortness of breath and COPD. EXAM: PORTABLE CHEST 1 VIEW COMPARISON:  09/16/2015 FINDINGS: Cardiomegaly and diffuse bilateral interstitial opacities again noted. Left lower lung opacities have decreased. There is no evidence of pneumothorax. No other changes identified. IMPRESSION: Decreased left lower lung opacities, otherwise unchanged appearance chest. Electronically Signed   By: Margarette Canada M.D.   On: 09/20/2015 07:34   Dg Chest Port 1 View  09/16/2015  CLINICAL DATA:  Acute onset of shortness of breath and decreased O2 saturation. Initial encounter. EXAM: PORTABLE CHEST 1 VIEW COMPARISON:  Chest radiograph and CTA of the chest performed 09/13/2015 FINDINGS: New left basilar airspace opacity is concerning for pneumonia. Increased vascular congestion is noted, and superimposed interstitial edema cannot be excluded. No definite pleural effusion or pneumothorax is seen. The cardiomediastinal silhouette is borderline enlarged. No acute osseous abnormalities are identified. IMPRESSION: New left basilar airspace opacity is concerning for pneumonia. Increased vascular congestion noted, with borderline cardiomegaly. Superimposed interstitial edema cannot be excluded. Electronically Signed   By: Garald Balding M.D.   On: 09/16/2015 04:44   Dg Chest Portable 1 View  09/13/2015  CLINICAL DATA:  Shortness of breath, bilateral lower extremity edema EXAM: PORTABLE CHEST 1 VIEW COMPARISON:  06/23/2015 FINDINGS: Increased  interstitial markings, chronic. Mild cephalization after. This appearance suggests chronic compensated CHF without frank interstitial edema. No pleural effusion or pneumothorax. The heart is top-normal in size. IMPRESSION: Chronic interstitial markings.  No frank interstitial edema. No definite pleural effusions. Electronically Signed   By: Julian Hy M.D.   On: 09/13/2015 16:34         Subjective: Overall patient is breathing better but still has some dyspnea on weakness and is weaker than usual. Denies any chest pain, nausea, vomiting, diarrhea, abdominal pain. Denies any fevers or chills. No headache or neck pain.  Objective: Filed Vitals:   09/20/15 0841 09/20/15 1142 09/20/15 1318 09/20/15 1610  BP:   135/66   Pulse:   98   Temp:   98.6  F (37 C)   TempSrc:   Oral   Resp:   24   Height:      Weight:      SpO2: 95% 94% 95% 94%    Intake/Output Summary (Last 24 hours) at 09/20/15 1811 Last data filed at 09/20/15 1441  Gross per 24 hour  Intake    150 ml  Output   1750 ml  Net  -1600 ml   Weight change: -0.454 kg (-1 lb) Exam:   General:  Pt is alert, follows commands appropriately, not in acute distress  HEENT: No icterus, No thrush, No neck mass, [place]/AT  Cardiovascular: RRR, S1/S2, no rubs, no gallops  Respiratory: scattered bilateral rales. Diminished breath sounds bilateral.  Abdomen: Soft/+BS, non tender, non distended, no guarding  Extremities: No edema, No lymphangitis, No petechiae, No rashes, no synovitis  Data Reviewed: Basic Metabolic Panel:  Recent Labs Lab 09/14/15 1000 09/16/15 0434 09/17/15 0314 09/18/15 0316 09/19/15 0521 09/20/15 0507  NA  --  129* 130* 132* 137 139  K  --  4.3 4.6 3.9 3.6 3.2*  CL  --  83* 84* 83* 84* 81*  CO2  --  37* 39* 39* 45* >50*  GLUCOSE  --  139* 146* 140* 115* 126*  BUN  --  12 14 15 13 15   CREATININE  --  0.37* 0.42* 0.43* 0.41* 0.40*  CALCIUM  --  8.8* 8.8* 8.8* 9.1 9.1  MG 1.9  --   --   --   --    --    Liver Function Tests: No results for input(s): AST, ALT, ALKPHOS, BILITOT, PROT, ALBUMIN in the last 168 hours. No results for input(s): LIPASE, AMYLASE in the last 168 hours. No results for input(s): AMMONIA in the last 168 hours. CBC:  Recent Labs Lab 09/16/15 0433 09/16/15 0434 09/17/15 0314 09/18/15 0316 09/19/15 0521  WBC 14.1* 14.2* 8.5 10.9* 10.8*  NEUTROABS 12.0*  --   --   --   --   HGB 13.6 13.5 11.7* 11.9* 13.0  HCT 41.6 41.7 35.9* 35.7* 40.2  MCV 95.6 95.6 96.2 93.9 96.6  PLT 279 283 241 258 279   Cardiac Enzymes:  Recent Labs Lab 09/13/15 2228 09/14/15 0457 09/14/15 1000  TROPONINI 0.06* 0.03 0.03   BNP: Invalid input(s): POCBNP CBG: No results for input(s): GLUCAP in the last 168 hours.  Recent Results (from the past 240 hour(s))  MRSA PCR Screening     Status: None   Collection Time: 09/16/15  8:13 PM  Result Value Ref Range Status   MRSA by PCR NEGATIVE NEGATIVE Final    Comment:        The GeneXpert MRSA Assay (FDA approved for NASAL specimens only), is one component of a comprehensive MRSA colonization surveillance program. It is not intended to diagnose MRSA infection nor to guide or monitor treatment for MRSA infections.      Scheduled Meds: . antiseptic oral rinse  7 mL Mouth Rinse q12n4p  . aspirin  81 mg Oral QODAY  . budesonide (PULMICORT) nebulizer solution  0.25 mg Nebulization BID  . chlorhexidine  15 mL Mouth Rinse BID  . clonazePAM  0.5 mg Oral QHS  . diclofenac sodium  2 g Topical QID  . diltiazem  120 mg Oral QHS  . enoxaparin (LOVENOX) injection  40 mg Subcutaneous Q24H  . furosemide  40 mg Oral Daily  . guaiFENesin  600 mg Oral BID  . ipratropium-albuterol  3 mL Nebulization QID  .  levofloxacin (LEVAQUIN) IV  750 mg Intravenous Q24H  . Living Better with Heart Failure Book   Does not apply Once  . methylPREDNISolone (SOLU-MEDROL) injection  60 mg Intravenous Q24H  . nicotine  14 mg Transdermal Daily  .  pantoprazole  40 mg Oral Daily  . potassium chloride  10 mEq Intravenous Q1 Hr x 3  . sodium chloride flush  3 mL Intravenous Q12H   Continuous Infusions:    Anay Rathe, DO  [facility] Pager [phone]  If 7PM-7AM, please contact night-coverage [email or web address] Password [email or web address] 09/20/2015, 6:11 PM   LOS: 7 days

## 2015-09-21 ENCOUNTER — Inpatient Hospital Stay (HOSPITAL_COMMUNITY): Payer: Medicare Other

## 2015-09-21 DIAGNOSIS — G9340 Encephalopathy, unspecified: Secondary | ICD-10-CM | POA: Insufficient documentation

## 2015-09-21 DIAGNOSIS — J9621 Acute and chronic respiratory failure with hypoxia: Secondary | ICD-10-CM | POA: Insufficient documentation

## 2015-09-21 DIAGNOSIS — J9622 Acute and chronic respiratory failure with hypercapnia: Secondary | ICD-10-CM

## 2015-09-21 LAB — BASIC METABOLIC PANEL
BUN: 16 mg/dL (ref 6–20)
CALCIUM: 9 mg/dL (ref 8.9–10.3)
CO2: >50 mmol/L — ABNORMAL HIGH (ref 22–32)
CREATININE: 0.51 mg/dL (ref 0.44–1.00)
Chloride: 77 mmol/L — ABNORMAL LOW (ref 101–111)
GFR calc Af Amer: >60 mL/min (ref >60)
GLUCOSE: 110 mg/dL — AB (ref 65–99)
Potassium: 3.6 mmol/L (ref 3.5–5.1)
SODIUM: 140 mmol/L (ref 135–145)

## 2015-09-21 LAB — CBC
HEMATOCRIT: 45.2 % (ref 36.0–46.0)
Hemoglobin: 13.6 g/dL (ref 12.0–15.0)
MCH: 30.4 pg (ref 26.0–34.0)
MCHC: 30.1 g/dL (ref 30.0–36.0)
MCV: 100.9 fL — ABNORMAL HIGH (ref 78.0–100.0)
PLATELETS: 256 10*3/uL (ref 150–400)
RBC: 4.48 MIL/uL (ref 3.87–5.11)
RDW: 13.9 % (ref 11.5–15.5)
WBC: 8.1 10*3/uL (ref 4.0–10.5)

## 2015-09-21 LAB — BLOOD GAS, ARTERIAL
ACID-BASE EXCESS: 23.5 mmol/L — AB (ref 0.0–2.0)
Acid-Base Excess: 28 mmol/L — ABNORMAL HIGH (ref 0.0–2.0)
BICARBONATE: 54.4 meq/L — AB (ref 20.0–24.0)
Bicarbonate: 59.3 mEq/L — ABNORMAL HIGH (ref 20.0–24.0)
DRAWN BY: 257701
Drawn by: 257701
FIO2: 0.5
FIO2: 0.5
LHR: 12 {breaths}/min
O2 SAT: 93.5 %
O2 SAT: 95.5 %
PATIENT TEMPERATURE: 98.6
PCO2 ART: 99.8 mmHg — AB (ref 35.0–45.0)
PEEP/CPAP: 7 cmH2O
PEEP: 7 cmH2O
PH ART: 7.377 (ref 7.350–7.450)
PO2 ART: 75.8 mmHg — AB (ref 80.0–100.0)
PRESSURE CONTROL: 13 cmH2O
Patient temperature: 98.6
Pressure control: 13 cmH2O
RATE: 12 resp/min
TCO2: 48.7 mmol/L (ref 0–100)
TCO2: 53.2 mmol/L (ref 0–100)
pCO2 arterial: 94.7 mmHg (ref 35.0–45.0)
pH, Arterial: 7.392 (ref 7.350–7.450)
pO2, Arterial: 66.4 mmHg — ABNORMAL LOW (ref 80.0–100.0)

## 2015-09-21 LAB — MAGNESIUM: MAGNESIUM: 1.7 mg/dL (ref 1.7–2.4)

## 2015-09-21 MED ORDER — HYDRALAZINE HCL 20 MG/ML IJ SOLN
10.0000 mg | INTRAMUSCULAR | Status: DC | PRN
  Administered 2015-09-21 – 2015-09-26 (×3): 10 mg via INTRAVENOUS
  Filled 2015-09-21 (×4): qty 1

## 2015-09-21 MED ORDER — FUROSEMIDE 10 MG/ML IJ SOLN
40.0000 mg | Freq: Three times a day (TID) | INTRAMUSCULAR | Status: AC
  Administered 2015-09-21 (×2): 40 mg via INTRAVENOUS
  Filled 2015-09-21 (×2): qty 4

## 2015-09-21 MED ORDER — POTASSIUM CHLORIDE 10 MEQ/100ML IV SOLN
10.0000 meq | INTRAVENOUS | Status: AC
  Administered 2015-09-21 (×2): 10 meq via INTRAVENOUS
  Filled 2015-09-21: qty 100

## 2015-09-21 MED ORDER — PANTOPRAZOLE SODIUM 40 MG IV SOLR
40.0000 mg | INTRAVENOUS | Status: DC
  Administered 2015-09-21 – 2015-09-24 (×4): 40 mg via INTRAVENOUS
  Filled 2015-09-21 (×5): qty 40

## 2015-09-21 MED ORDER — HALOPERIDOL LACTATE 5 MG/ML IJ SOLN
1.0000 mg | INTRAMUSCULAR | Status: DC | PRN
  Administered 2015-09-21: 4 mg via INTRAVENOUS

## 2015-09-21 MED ORDER — BUDESONIDE 0.5 MG/2ML IN SUSP
0.5000 mg | Freq: Two times a day (BID) | RESPIRATORY_TRACT | Status: DC
  Administered 2015-09-21 – 2015-09-27 (×13): 0.5 mg via RESPIRATORY_TRACT
  Filled 2015-09-21 (×14): qty 2

## 2015-09-21 MED ORDER — LORAZEPAM 0.5 MG PO TABS
0.5000 mg | ORAL_TABLET | Freq: Once | ORAL | Status: AC
  Administered 2015-09-21: 0.5 mg via ORAL
  Filled 2015-09-21: qty 1

## 2015-09-21 MED ORDER — POTASSIUM CHLORIDE CRYS ER 20 MEQ PO TBCR: 40.0000 meq | EXTENDED_RELEASE_TABLET | Freq: Once | ORAL | Status: DC

## 2015-09-21 MED ORDER — HALOPERIDOL LACTATE 5 MG/ML IJ SOLN
INTRAMUSCULAR | Status: AC
  Filled 2015-09-21: qty 1

## 2015-09-21 MED ORDER — FUROSEMIDE 10 MG/ML IJ SOLN: 20.0000 mg | Freq: Every day | INTRAMUSCULAR | Status: DC

## 2015-09-21 MED ORDER — METHYLPREDNISOLONE SODIUM SUCC 125 MG IJ SOLR
80.0000 mg | Freq: Three times a day (TID) | INTRAMUSCULAR | Status: DC
  Administered 2015-09-21 – 2015-09-22 (×3): 80 mg via INTRAVENOUS
  Filled 2015-09-21 (×3): qty 2

## 2015-09-21 MED ORDER — IPRATROPIUM-ALBUTEROL 0.5-2.5 (3) MG/3ML IN SOLN
3.0000 mL | Freq: Four times a day (QID) | RESPIRATORY_TRACT | Status: DC
  Administered 2015-09-21 (×3): 3 mL via RESPIRATORY_TRACT
  Filled 2015-09-21 (×3): qty 3

## 2015-09-21 NOTE — Progress Notes (Signed)
eLink Physician-Brief Progress Note Patient Name: Jill White DOB: May 27, 1941 MRN: [id number]   Date of Service  09/21/2015  HPI/Events of Note  Htn, hr 78-80  eICU Interventions  hydral prn add, she is unable to take orals cardizem now     Intervention Category Intermediate Interventions: Hypertension - evaluation and management  [name]. 09/21/2015, 11:20 PM

## 2015-09-21 NOTE — Progress Notes (Signed)
During bedside report, pt was lethargic but responsive with labored breathing and O2 sats at 78%. RRT and RT called to bedside. BP 94/49, HR in 120s, O2 sats continued to drop into the 50s even after pt transitioned to the Ventimask, then transitioned to non-re breather.. EKG obtained, MD at bedside and pt transferred to SDU.

## 2015-09-21 NOTE — Progress Notes (Signed)
PROGRESS NOTE  Jill White [id number] DOB: 13-Oct-1940 DOA: 09/13/2015 PCP: Laurey Morale, MD   Brief History 75 year old female with history of hypertension, COPD, moderate aortic stenosis, tachycardia presented with increasing shortness of breath after developing new onset upper back pain. There was no history of trauma or injury. Pain in the upper back appeared to be worsening with deep inspiration and movement. The pain resulted in increasing shortness of breath. She was initially treated for HAP and COPD exacerbation.  On the early morning of 09/16/2015, the patient developed respiratory distress and was placed on BiPAP. CCM was consulted and pt was started on IV steroids and abx. The patient improved clinically and was weaned off of BiPAP. She was subsequently transferred to telemetry unit, and her antibiotics were de-escalated. Morning of 09/21/15, pt was found obtunded and oxygen sats in 60s.    During the night of 09/21/15, pt c/o of increased anxiety and panic attack.  She was given one time dose of ativan on top of her klonopin scheduled dose (which had been cut in half from prior home dose) and her [place] was increased to 4L [place].  Around 7AM, pt was found obtunded with low sats.  When I arrived, pt was already on NRB.  She was transferred to ICU and placed on BiPAP.  Initital ABG 7.14/ >80/105 on NRB x 10-15 min.  PCCM was reconsulted. Assessment/Plan: Acute on chronic respiratory failure with hypoxia and hypercarbia -Secondary to HAP and CHF in the setting of severe aortic stenosis and COPD--now due to hypoventilation from benzos -pt had component of fluid overload with edema on CXR and peripheral edema-->furosemide per cardiology--stable on lasix 40 mg daily -09/21/15--reconsulted PCCM -continue IV steroids-->po prednisone on 3/31-pt adamantly refuses po prednisone as it caused GIB, but willing to take IV steroids and -continue pulmicort and Duonebs -D/C all opioid/hypnotic  medications -speech therapy evaluation-->regular diet -09/20/15--repeat cxr--improving but persistent infiltrates -09/21/15--recurrent respiratory failure--moved back to SDU and placed on Bipap; PCCM reconsulted -repeat ABG in 1 hour -granddaughter/family appears to have unrealistic expectations  HAP -vanco and cefepime started 3/29-->3/31 -09/18/15-->start levofloxacin (pt adamant she cannot tolerate po???)--total abx D#6 -procalcitonin <0.10 -lactic acid 0.6  COPD exacerbation -pt is adamant she cannot take prednisone although "GI bleed" is the listed allergy -continue solu-medrol, decrease to once daily 4/1 and monitor clinically   T6 and T8 compression fracture -09/13/2015 CT angio chest negative for pulmonary embolus -09/14/2015 MRI thoracic spine acute T8 compression fracture -D/C opioid/hypnotic meds for now  Moderate to severe aortic stenosis -Appreciate cardiology follow-up -ultimately need evaluation for TAVR  Right shoulder pain -Recent steroid injection 09/03/2015 -Ultrasound of the shoulder reveals subacromial bursitis, 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 -Symptomatically treatment-->voltaren gel -I have explained to the patient why we are trying to minimize opioids -pt refuses physical therapy  Tachycardia -Recent Holter monitor reveals brief runs of atrial tachycardia, sinus rhythm with Wenckebach AV block -Patient has chronic LBBB  Tobacco abuse -Cessation discussed -no desire to quit  Hyponatremia -likely due to CHF -continue lasix-->improved  Restless Leg Syndrome -restart lower dose klonopin 3/31  Family Communication:Granddaughter updated at beside 4/2 Disposition Plan: transfer to SDU   Procedures/Studies: Dg Shoulder Right  09/15/2015  CLINICAL DATA:  Chronic right shoulder pain.  No known injury. EXAM: RIGHT SHOULDER - 2+ VIEW COMPARISON:  Chest CT 09/13/2015. FINDINGS: Moderate AC joint degenerative changes. Mild glenohumeral joint degenerative  changes. No acute bony abnormality or worrisome bone lesion.  The visualize right ribs are intact. The visualize right lung is clear. Stable emphysematous changes and pulmonary scarring. IMPRESSION: AC joint and glenohumeral joint degenerative changes but no acute bony findings. Electronically Signed   By: Marijo Sanes M.D.   On: 09/15/2015 14:12   Ct Angio Chest Pe W/cm &/or Wo Cm  09/13/2015  CLINICAL DATA:  75 year old female with shortness of breath and upper back pain. EXAM: CT ANGIOGRAPHY CHEST WITH CONTRAST TECHNIQUE: Multidetector CT imaging of the chest was performed using the standard protocol during bolus administration of intravenous contrast. Multiplanar CT image reconstructions and MIPs were obtained to evaluate the vascular anatomy. CONTRAST:  172mL OMNIPAQUE IOHEXOL 350 MG/ML SOLN COMPARISON:  Chest radiograph dated 09/13/2015 FINDINGS: There is emphysematous changes of the lungs. No focal consolidation, pleural effusion, or pneumothorax. The central airways are patent. There is atherosclerotic calcification of the thoracic aorta. No CT evidence of pulmonary embolism. There is no cardiomegaly or pericardial effusion. There is coronary vascular calcification. There is no hilar or mediastinal adenopathy. The esophagus and the thyroid gland appear unremarkable. There is no axillary adenopathy. The chest wall soft tissues appear unremarkable. There is osteopenia with degenerative changes of the spine. C6 compression fracture with anterior wedging, likely old. There is T8 compression fracture with mild sclerotic changes, age indeterminate, likely old. Clinical correlation is recommended. The visualized upper abdomen appears unremarkable. Review of the MIP images confirms the above findings. IMPRESSION: No CT evidence of pulmonary embolism. Osteopenia with multilevel compression fractures of the thoracic spine, age indeterminate, likely old. Clinical correlation is recommended. Electronically Signed    By: Anner Crete M.D.   On: 09/13/2015 23:43   Mr Thoracic Spine Wo Contrast  09/14/2015  CLINICAL DATA:  Initial evaluation for acute back pain EXAM: MRI THORACIC SPINE WITHOUT CONTRAST TECHNIQUE: Multiplanar, multisequence MR imaging of the thoracic spine was performed. No intravenous contrast was administered. COMPARISON:  Prior CT from 09/13/2015. FINDINGS: Limited views of the cervical spine demonstrate multilevel degenerative spondylolysis without severe stenosis. Vertebral bodies are normally aligned with preservation of the normal thoracic kyphosis. Chronic anterior wedging deformity of the T6 vertebral body with without significant retropulsion. There is abnormal T1 hypo intense, T2/STIR hyperintense signal intensity within the T8 vertebral body, consistent with acute compression fracture. Mild 30% of central height loss without bony retropulsion. No other acute or subacute appearing compression fracture identified. No listhesis or malalignment. Signal intensity within the vertebral body bone marrow within normal limits. Small subcentimeter lesion with the T11 vertebral body noted, indeterminate, but may reflect a small hemangioma. Signal intensity within the visualized thoracic cord is normal. Please note that the distal cord is not visualized on this exam. No acute paraspinous soft tissue abnormality. Small layering bilateral pleural effusions noted. Heavy atheromatous plaque within the visualized aorta. Intraluminal fluid noted within the visualized esophagus . T1-2: Mild disc bulge and posterior element hypertrophy. No significant stenosis. T2-3: Mild disc bulge and posterior element hypertrophy. No significant stenosis. T3-4:  Mild broad-based disc bulge without significant stenosis. T4-5:  Minimal disc bulge without stenosis. T5-6:  Negative. T6-7: Mild bony retropulsion related to the chronic T6 vertebral body fracture. Superimposed tiny central disc protrusion. Minimal cord flattening without  cord signal changes. Mild posterior element hypertrophy. Very mild canal stenosis. T7-T8:  Negative. T8-9:  Mild posterior element hypertrophy without stenosis. T9-10: Mild posterior element hypertrophy without stenosis. No significant disc bulge. T10-11: Posterior element hypertrophy without stenosis. No significant disc bulge. T11-12: Bilateral facet arthrosis without significant stenosis.  No significant disc bulge. T12-L1: Seen only on sagittal projection. Disc bulge with reactive endplate changes. No significant stenosis. IMPRESSION: 1. Acute compression fracture involving the T8 vertebral body with associated mild 30% height loss without bony retropulsion. 2. No other acute abnormality within the thoracic spine. 3. Chronic compression deformity of T6 with resultant mild canal stenosis. 4. 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 Please see above report for a full description these findings. 5. Small layering bilateral pleural effusions. Electronically Signed   By: Jeannine Boga M.D.   On: 09/14/2015 22:42   [place] Extrem Up Right Ltd  09/07/2015  MS[facility] US performed of: Right This study was ordered, performed, and interpreted by Charlann Boxer D.O. Shoulder:  Supraspinatus: Degenerative changes noted but no true acute tear. Patient does have bursal bulge. Underlying arthritic changes noted Infraspinatus: Appears normal on long and transverse views. Significant increase in Doppler flow Subscapularis: Degenerative changes noted. Positive bursa Teres Minor: Appears normal on long and transverse views. AC joint: Moderate arthritis Glenohumeral Joint: Moderate arthritis. Glenoid Labrum: Intact without visualized tears. Biceps Tendon: Appears normal on long and transverse views, no fraying of tendon, tendon located in intertubercular groove, no subluxation with shoulder internal or external rotation. Impression: Subacromial bursitis, moderate to severe underlying arthritic changes.  Procedure: Real-time Ultrasound Guided Injection of right glenohumeral joint Device: GE Logiq E  Ultrasound guided injection is preferred based studies that show increased duration, increased effect, greater accuracy, decreased procedural pain, increased response rate with ultrasound guided versus blind injection.  Verbal informed consent obtained.  Time-out conducted.  Noted no overlying erythema, induration, or other signs of local infection.  Skin prepped in a sterile fashion.  Local anesthesia: Topical Ethyl chloride.  With sterile technique and under real time ultrasound guidance: Joint visualized. 23g 1  inch needle inserted posterior approach. Pictures taken for needle placement. Patient did have injection of 2 cc of 1% lidocaine, 2 cc of 0.5% Marcaine, and 1.0 cc of Kenalog 40 mg/dL. Completed without difficulty  Pain immediately resolved suggesting accurate placement of the medication.  Advised to call if fevers/chills, erythema, induration, drainage, or persistent bleeding.  Images permanently stored and available for review in the ultrasound unit.  Impression: Technically successful ultrasound guided injection.  Dg Chest Port 1 View  09/20/2015  CLINICAL DATA:  Shortness of breath and COPD. EXAM: PORTABLE CHEST 1 VIEW COMPARISON:  09/16/2015 FINDINGS: Cardiomegaly and diffuse bilateral interstitial opacities again noted. Left lower lung opacities have decreased. There is no evidence of pneumothorax. No other changes identified. IMPRESSION: Decreased left lower lung opacities, otherwise unchanged appearance chest. Electronically Signed   By: Margarette Canada M.D.   On: 09/20/2015 07:34   Dg Chest Port 1 View  09/16/2015  CLINICAL DATA:  Acute onset of shortness of breath and decreased O2 saturation. Initial encounter. EXAM: PORTABLE CHEST 1 VIEW COMPARISON:  Chest radiograph and CTA of the chest performed 09/13/2015 FINDINGS: New left basilar airspace opacity is concerning for pneumonia.  Increased vascular congestion is noted, and superimposed interstitial edema cannot be excluded. No definite pleural effusion or pneumothorax is seen. The cardiomediastinal silhouette is borderline enlarged. No acute osseous abnormalities are identified. IMPRESSION: New left basilar airspace opacity is concerning for pneumonia. Increased vascular congestion noted, with borderline cardiomegaly. Superimposed interstitial edema cannot be excluded. Electronically Signed   By: Garald Balding M.D.   On: 09/16/2015 04:44   Dg Chest Portable 1 View  09/13/2015  CLINICAL DATA:  Shortness of breath, bilateral lower extremity edema EXAM:  PORTABLE CHEST 1 VIEW COMPARISON:  06/23/2015 FINDINGS: Increased interstitial markings, chronic. Mild cephalization after. This appearance suggests chronic compensated CHF without frank interstitial edema. No pleural effusion or pneumothorax. The heart is top-normal in size. IMPRESSION: Chronic interstitial markings.  No frank interstitial edema. No definite pleural effusions. Electronically Signed   By: Julian Hy M.D.   On: 09/13/2015 16:34         Subjective: Pt obtunded but was awake.  Denies cp, sob, HA, abdominal pain.  No f/c.  Objective: Filed Vitals:   09/20/15 2246 09/21/15 0305 09/21/15 0405 09/21/15 0736  BP: 156/78  148/56 94/49  Pulse: 118 103 112   Temp: 98.1 F (36.7 C)  98.4 F (36.9 C)   TempSrc: Oral  Oral   Resp: 20 24 24 24   Height:      Weight:      SpO2: 90% 94% 90%     Intake/Output Summary (Last 24 hours) at 09/21/15 0759 Last data filed at 09/21/15 0404  Gross per 24 hour  Intake    390 ml  Output   1800 ml  Net  -1410 ml   Weight change:  Exam:   General:  Pt is alert, follows commands appropriately, not in acute distress  HEENT: No icterus, No thrush, No neck mass, [place]/AT  Cardiovascular: RRR, S1/S2, no rubs, no gallops  Respiratory: bilateral rales.  No wheeze.  Abdomen: Soft/+BS, non tender, non distended, no  guarding; no hepatosplenomegaly  Extremities: No edema, No lymphangitis, No petechiae, No rashes, no synovitis  Data Reviewed: Basic Metabolic Panel:  Recent Labs Lab 09/14/15 1000  09/17/15 0314 09/18/15 0316 09/19/15 0521 09/20/15 0507 09/21/15 0445  NA  --   < > 130* 132* 137 139 140  K  --   < > 4.6 3.9 3.6 3.2* 3.6  CL  --   < > 84* 83* 84* 81* 77*  CO2  --   < > 39* 39* 45* >50* >50*  GLUCOSE  --   < > 146* 140* 115* 126* 110*  BUN  --   < > 14 15 13 15 16   CREATININE  --   < > 0.42* 0.43* 0.41* 0.40* 0.51  CALCIUM  --   < > 8.8* 8.8* 9.1 9.1 9.0  MG 1.9  --   --   --   --   --  1.7  < > = values in this interval not displayed. Liver Function Tests: No results for input(s): AST, ALT, ALKPHOS, BILITOT, PROT, ALBUMIN in the last 168 hours. No results for input(s): LIPASE, AMYLASE in the last 168 hours. No results for input(s): AMMONIA in the last 168 hours. CBC:  Recent Labs Lab 09/16/15 0433 09/16/15 0434 09/17/15 0314 09/18/15 0316 09/19/15 0521 09/21/15 0445  WBC 14.1* 14.2* 8.5 10.9* 10.8* 8.1  NEUTROABS 12.0*  --   --   --   --   --   HGB 13.6 13.5 11.7* 11.9* 13.0 13.6  HCT 41.6 41.7 35.9* 35.7* 40.2 45.2  MCV 95.6 95.6 96.2 93.9 96.6 100.9*  PLT 279 283 241 258 279 256   Cardiac Enzymes:  Recent Labs Lab 09/14/15 1000  TROPONINI 0.03   BNP: Invalid input(s): POCBNP CBG: No results for input(s): GLUCAP in the last 168 hours.  Recent Results (from the past 240 hour(s))  MRSA PCR Screening     Status: None   Collection Time: 09/16/15  8:13 PM  Result Value Ref Range Status   MRSA by  PCR NEGATIVE NEGATIVE Final    Comment:        The GeneXpert MRSA Assay (FDA approved for NASAL specimens only), is one component of a comprehensive MRSA colonization surveillance program. It is not intended to diagnose MRSA infection nor to guide or monitor treatment for MRSA infections.      Scheduled Meds: . antiseptic oral rinse  7 mL Mouth Rinse  q12n4p  . aspirin  81 mg Oral QODAY  . budesonide (PULMICORT) nebulizer solution  0.25 mg Nebulization BID  . chlorhexidine  15 mL Mouth Rinse BID  . diclofenac sodium  2 g Topical QID  . diltiazem  120 mg Oral QHS  . enoxaparin (LOVENOX) injection  40 mg Subcutaneous Q24H  . furosemide  20 mg Intravenous Daily  . guaiFENesin  600 mg Oral BID  . ipratropium-albuterol  3 mL Nebulization Q6H  . levofloxacin (LEVAQUIN) IV  750 mg Intravenous Q24H  . Living Better with Heart Failure Book   Does not apply Once  . methylPREDNISolone (SOLU-MEDROL) injection  60 mg Intravenous Q24H  . nicotine  14 mg Transdermal Daily  . pantoprazole (PROTONIX) IV  40 mg Intravenous Q24H  . sodium chloride flush  3 mL Intravenous Q12H   Continuous Infusions:    Deklan Minar, DO  [facility] Pager [phone]  If 7PM-7AM, please contact night-coverage [email or web address] Password [email or web address] 09/21/2015, 7:59 AM   LOS: 8 days

## 2015-09-21 NOTE — Progress Notes (Addendum)
eLink Physician-Brief Progress Note Patient Name: Jill White DOB: [birth year] MRN: [id number]   Date of Service  09/21/2015  HPI/Events of Note  resp distress Ativan knocked her out  eICU Interventions  bipap ABG Change to IV potassium Haldol prn     Intervention Category Major Interventions: Respiratory failure - evaluation and management  ALVA,RAKESH V. 09/21/2015, 6:03 PM

## 2015-09-21 NOTE — Progress Notes (Signed)
Date:  September 21, 2015 Chart reviewed for concurrent status and case management needs. Will continue to follow patient for changes and needs: continues to require bi-pap Velva Harman, BSN, [place], [place]

## 2015-09-21 NOTE — Progress Notes (Signed)
PULMONARY / CRITICAL CARE MEDICINE   Name: Jill White MRN: [id number] DOB: 11-06-1940    ADMISSION DATE:  09/13/2015 CONSULTATION DATE:  3/29  REFERRING MD: TAT  CHIEF COMPLAINT:    HISTORY OF PRESENT ILLNESS:   75yo female with severe COPD, mod/severe AS initially admitted 3/26 with acute on chronic respiratory from AECOPD and decompensated heart failure.  She was treated with steroids, diuresis, BD.  Had worsening respiratory status on 3/29 requiring tx SDU and bipap.  She improved with bipap and aggressive rx AECOPD.  She tx back to floor on 4/1.  On 4/3 pt had worsening hypoxia, AMS and was tx back to ICU on bipap and PCCM re-consulted.    SUBJECTIVE:  Got ativan overnight for anxiety, agitation.  tx back to ICU this am with resp distress, AMS.  Improving on bipap.   REVIEW OF SYSTEMS:  Unable to obtain currently with patient on BiPAP.  VITAL SIGNS: BP 120/62 mmHg  Pulse 101  Temp(Src) 98.6 F (37 C) (Axillary)  Resp 25  Ht 5\' 3"  (1.6 m)  Wt 51.6 kg (113 lb 12.1 oz)  BMI 20.16 kg/m2  SpO2 94%  HEMODYNAMICS:    VENTILATOR SETTINGS: Vent Mode:  [-] BIPAP FiO2 (%):  [45 %-55 %] 55 % Set Rate:  [12 bmp] 12 bmp PEEP:  [5 cmH20-7 cmH20] 7 cmH20  INTAKE / OUTPUT: I/O last 3 completed shifts: In: 540 [P.O.:240; IV Piggyback:300] Out: 2325 [Urine:2325]   PHYSICAL EXAMINATION: General: Older adult female NAD on bipap  Neuro: drowsy, easily arousable, agitated at times, wanting bipap off  HEENT:  [place]/AT Cardiovascular: RRR, no MRG  Lungs: resps even non labored on bipap, very diminished throughout, rare exp wheeze  Abdomen: active bs, soft, non-tender, non distended  Musculoskeletal: no acute deformities  Skin:  Cool, Dry, Intact  LABS: ABG    Component Value Date/Time   PHART 7.147* 09/21/2015 0748   PCO2ART VALUE TO HIGH TO READ 09/21/2015 0748   PO2ART 105* 09/21/2015 0748   HCO3 39.0* 09/16/2015 0608   TCO2 PENDING 09/21/2015 0748   ACIDBASEDEF PENDING  09/21/2015 0748   O2SAT 96.2 09/21/2015 0748    BMET  Recent Labs Lab 09/19/15 0521 09/20/15 0507 09/21/15 0445  NA 137 139 140  K 3.6 3.2* 3.6  CL 84* 81* 77*  CO2 45* >50* >50*  BUN 13 15 16   CREATININE 0.41* 0.40* 0.51  GLUCOSE 115* 126* 110*    Electrolytes  Recent Labs Lab 09/14/15 1000  09/19/15 0521 09/20/15 0507 09/21/15 0445  CALCIUM  --   < > 9.1 9.1 9.0  MG 1.9  --   --   --  1.7  < > = values in this interval not displayed.  CBC  Recent Labs Lab 09/18/15 0316 09/19/15 0521 09/21/15 0445  WBC 10.9* 10.8* 8.1  HGB 11.9* 13.0 13.6  HCT 35.7* 40.2 45.2  PLT 258 279 256    Coag's No results for input(s): APTT, INR in the last 168 hours.  Sepsis Markers  Recent Labs Lab 09/16/15 0433 09/16/15 0537 09/18/15 0316 09/20/15 0507  LATICACIDVEN  --  0.6  --   --   PROCALCITON <0.10  --  <0.10 <0.10    ABG  Recent Labs Lab 09/16/15 0424 09/16/15 0608 09/21/15 0748  PHART 7.220* 7.307* 7.147*  PCO2ART ABOVE REPORTABLE RANGE 80.4* VALUE TO HIGH TO READ  PO2ART 50.0* 70.6* 105*    Liver Enzymes No results for input(s): AST, ALT, ALKPHOS, BILITOT, ALBUMIN  in the last 168 hours.  Cardiac Enzymes  Recent Labs Lab 09/14/15 1000  TROPONINI 0.03    Glucose No results for input(s): GLUCAP in the last 168 hours.  Imaging Dg Chest Port 1 View  09/21/2015  CLINICAL DATA:  Acute on chronic respiratory failure with hypoxia and hypercapnia. COPD. EXAM: PORTABLE CHEST 1 VIEW COMPARISON:  Radiographs dated 09/20/2015, 09/16/2015 and 06/23/2015 and CT scan dated 09/13/2015 FINDINGS: There is persistent accentuation of the pulmonary vascularity and interstitial markings consistent with pulmonary edema superimposed on COPD. There is tortuosity and calcification of the thoracic aorta. No appreciable effusions. The osteopenia. No acute bone abnormality. IMPRESSION: Persistent interstitial edema superimposed on COPD. Electronically Signed   By: Lorriane Shire M.D.   On: 09/21/2015 08:15     STUDIES:  3/26 CXR >> Chronic changes, no acute findings  3/26 CTA Chest >> No acute findings 3/27 MR T-Spine >> Acute compression fracture involving the T8 vertebral body, small layering bilateral pleural effusions   3/27 Echo >> EF 45-50%, moderate aortic stenosis  3/29 CXR >> Left basilar airspace opacity, concerning for PNA, borderline caridomegaly  CULTURES:  ANTIBIOTICS: Cefepime 3/29 >> Vancomycin 3/29 >>  SIGNIFICANT EVENTS: 3/26 >> To ED with Increase SOB, upper back pain, swelling to lower extremities   3/29 >> Rapid response called, hypoxic in the 70s, HR 130-140s, lethargic    ASSESSMENT / PLAN:  Acute on chronic hypoxic and hypercarbia respiratory failure. Multi-factorial in setting AECOPD with possible HCAP +/- pulmonary edema in setting of severe AS likely c/b sedating medications.  Plan   bipap now F/u ABG in 1 hr  IV steroids  BD's - duonebs, budesonide  Diuresis as BP, Scr allow -- 40mg  IV q8h x 2 doses today, then back to 20mg  IV daily  pulm hygiene when off bipap  F/u CXR   Severe AS Tachycardia  Plan -  Cont diltiazem PO  Diuresis as above  Monitor chem     Acute Encephalopathy. R/t hypercarbia c/b sedating medications.  Improving with bipap.    Plan bipap as above  F/u ABG  Hold all sedating medications    T6 and T8 compression fracture. (T8 is acute) Plan continue to re-address to ensure we meet her pain needs yet not over-sedate.    Nickolas Madrid, NP 09/21/2015  9:51 AM Pager: [phone] or [phone]  PCCM Attending Note: I have seen and examined patient with nurse practitioner. Please refer to her progress note which I reviewed. Patient with acute on chronic hypercarbic respiratory failure and altered mental status secondary to hypercarbia. Mental status slowly improving. Continuing on BiPAP support. Likely secondary to Ativan overnight. May be some element of pulmonary edema as  well.  BP 125/57 mmHg  Pulse 98  Temp(Src) 98.6 F (37 C) (Axillary)  Resp 19  Ht 5\' 3"  (1.6 m)  Wt 51.6 kg (113 lb 12.1 oz)  BMI 20.16 kg/m2  SpO2 95% Gen.: Laying in bed. No distress. Currently on BiPAP. Integument: Warm and dry. No rash on exposed skin. Neurological: Patient now following commands although somewhat drowsy. Moving all 4 extremities spontaneously. Pulmonary: Normal work of breathing on BiPAP. Diminished breath sounds bilaterally.  CBC Latest Ref Rng 09/21/2015 09/19/2015 09/18/2015  WBC 4.0 - 10.5 K/uL 8.1 10.8(H) 10.9(H)  Hemoglobin 12.0 - 15.0 g/dL 13.6 13.0 11.9(L)  Hematocrit 36.0 - 46.0 % 45.2 40.2 35.7(L)  Platelets 150 - 400 K/uL 256 279 258    BMP Latest Ref Rng 09/21/2015 09/20/2015 09/19/2015  Glucose 65 - 99 mg/dL 110(H) 126(H) 115(H)  BUN 6 - 20 mg/dL 16 15 13   Creatinine 0.44 - 1.00 mg/dL 0.51 0.40(L) 0.41(L)  Sodium 135 - 145 mmol/L 140 139 137  Potassium 3.5 - 5.1 mmol/L 3.6 3.2(L) 3.6  Chloride 101 - 111 mmol/L 77(L) 81(L) 84(L)  CO2 22 - 32 mmol/L >50(H) >50(H) 45(H)  Calcium 8.9 - 10.3 mg/dL 9.0 9.1 9.1    A/P:  Patient with acute on chronic hypoxic & hypercarbic respiratory failure. Improving on noninvasive positive pressure ventilation. Altered mental status improving secondary to hypercarbia.  1. 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 Continuing scheduled budesonide & nebulizer therapies. 2. Acute on chronic hypoxic respiratory failure: Increasing patient's dose of Lasix today. Close monitoring of renal function. 3. Acute encephalopathy: Likely secondary to hypercarbia. Holding sedating medication.  Sonia Baller Ashok Cordia, M.D. [facility] Pager:  [phone] After 3pm or if no response, call [phone] 11:31 AM 09/21/2015

## 2015-09-21 NOTE — Progress Notes (Signed)
PT Cancellation Note  Patient Details Name: Jill White MRN: [id number] DOB: [birth year]   Cancelled Treatment:    Reason Eval/Treat Not Completed: Medical issues which prohibited therapy (pt with worsening hypoxia, AMS and was transferred back to ICU on bipap this morning)   [name],KATHrine E 09/21/2015, 10:20 AM  Carmelia Bake, PT, DPT 09/21/2015 Pager: [phone]

## 2015-09-22 ENCOUNTER — Inpatient Hospital Stay (HOSPITAL_COMMUNITY): Payer: Medicare Other

## 2015-09-22 ENCOUNTER — Inpatient Hospital Stay (HOSPITAL_COMMUNITY)
Admit: 2015-09-22 | Discharge: 2015-09-22 | Disposition: A | Discharge status: Home / Self Care | Payer: Medicare Other | Attending: Neurology | Admitting: Neurology

## 2015-09-22 DIAGNOSIS — E43 Unspecified severe protein-calorie malnutrition: Secondary | ICD-10-CM | POA: Insufficient documentation

## 2015-09-22 DIAGNOSIS — R569 Unspecified convulsions: Secondary | ICD-10-CM | POA: Insufficient documentation

## 2015-09-22 LAB — BLOOD GAS, ARTERIAL
Bicarbonate: 62.5 mEq/L — ABNORMAL HIGH (ref 20.0–24.0)
DELIVERY SYSTEMS: POSITIVE
Drawn by: 295031
Expiratory PAP: 6
FIO2: 1
FIO2: 0.5
INSPIRATORY PAP: 12
O2 SAT: 97.6 %
O2 Saturation: 96.2 %
PATIENT TEMPERATURE: 98.6
PATIENT TEMPERATURE: 97.6
PH ART: 7.147 — AB (ref 7.350–7.450)
PH ART: 7.502 — AB (ref 7.350–7.450)
PO2 ART: 105 mmHg — AB (ref 80.0–100.0)
TCO2: 65 mmol/L (ref 0–100)
pCO2 arterial: 79.9 mmHg (ref 35.0–45.0)
pO2, Arterial: 90.5 mmHg (ref 80.0–100.0)

## 2015-09-22 LAB — BASIC METABOLIC PANEL
BUN: 21 mg/dL — ABNORMAL HIGH (ref 6–20)
CALCIUM: 8.8 mg/dL — AB (ref 8.9–10.3)
CO2: >50 mmol/L — ABNORMAL HIGH (ref 22–32)
Chloride: 71 mmol/L — ABNORMAL LOW (ref 101–111)
Creatinine, Ser: 0.53 mg/dL (ref 0.44–1.00)
Glucose, Bld: 144 mg/dL — ABNORMAL HIGH (ref 65–99)
Potassium: 3.5 mmol/L (ref 3.5–5.1)
Sodium: 141 mmol/L (ref 135–145)

## 2015-09-22 LAB — CBC
HEMATOCRIT: 44.1 % (ref 36.0–46.0)
HEMOGLOBIN: 13.2 g/dL (ref 12.0–15.0)
MCH: 30.6 pg (ref 26.0–34.0)
MCHC: 29.9 g/dL — ABNORMAL LOW (ref 30.0–36.0)
MCV: 102.1 fL — ABNORMAL HIGH (ref 78.0–100.0)
Platelets: 242 10*3/uL (ref 150–400)
RBC: 4.32 MIL/uL (ref 3.87–5.11)
RDW: 13.8 % (ref 11.5–15.5)
WBC: 6.2 10*3/uL (ref 4.0–10.5)

## 2015-09-22 MED ORDER — IPRATROPIUM-ALBUTEROL 0.5-2.5 (3) MG/3ML IN SOLN
3.0000 mL | Freq: Four times a day (QID) | RESPIRATORY_TRACT | Status: DC
  Administered 2015-09-22 – 2015-09-25 (×13): 3 mL via RESPIRATORY_TRACT
  Filled 2015-09-22 (×13): qty 3

## 2015-09-22 MED ORDER — SODIUM CHLORIDE 0.9 % IV SOLN
1000.0000 mg | Freq: Two times a day (BID) | INTRAVENOUS | Status: DC
  Administered 2015-09-22: 1000 mg via INTRAVENOUS
  Filled 2015-09-22: qty 10

## 2015-09-22 MED ORDER — VALPROATE SODIUM 500 MG/5ML IV SOLN
125.0000 mg | Freq: Four times a day (QID) | INTRAVENOUS | Status: DC
  Administered 2015-09-23 – 2015-09-24 (×6): 125 mg via INTRAVENOUS
  Filled 2015-09-22 (×8): qty 1.25

## 2015-09-22 MED ORDER — SODIUM CHLORIDE 0.9 % IV SOLN
1500.0000 mg | Freq: Two times a day (BID) | INTRAVENOUS | Status: DC
  Administered 2015-09-23 (×3): 1500 mg via INTRAVENOUS
  Filled 2015-09-22 (×4): qty 15

## 2015-09-22 MED ORDER — METHYLPREDNISOLONE SODIUM SUCC 40 MG IJ SOLR
40.0000 mg | INTRAMUSCULAR | Status: DC
Start: 2015-09-23 — End: 2015-09-24
  Administered 2015-09-23 – 2015-09-24 (×2): 40 mg via INTRAVENOUS
  Filled 2015-09-22 (×2): qty 1

## 2015-09-22 MED ORDER — DILTIAZEM HCL ER COATED BEADS 120 MG PO CP24: 120.0000 mg | ORAL_CAPSULE | Freq: Every day | ORAL | Status: DC

## 2015-09-22 MED ORDER — DILTIAZEM HCL 100 MG IV SOLR
5.0000 mg/h | INTRAVENOUS | Status: DC
  Administered 2015-09-22: 5 mg/h via INTRAVENOUS
  Filled 2015-09-22 (×2): qty 100

## 2015-09-22 MED ORDER — FUROSEMIDE 10 MG/ML IJ SOLN
20.0000 mg | Freq: Two times a day (BID) | INTRAMUSCULAR | Status: DC
  Administered 2015-09-22 (×2): 20 mg via INTRAVENOUS
  Filled 2015-09-22 (×3): qty 2

## 2015-09-22 MED ORDER — LEVETIRACETAM 500 MG/5ML IV SOLN
500.0000 mg | Freq: Once | INTRAVENOUS | Status: AC
  Administered 2015-09-22: 500 mg via INTRAVENOUS
  Filled 2015-09-22: qty 5

## 2015-09-22 MED ORDER — FUROSEMIDE 40 MG PO TABS: 40.0000 mg | ORAL_TABLET | Freq: Two times a day (BID) | ORAL | Status: DC

## 2015-09-22 MED ORDER — SODIUM CHLORIDE 0.9 % IV SOLN
INTRAVENOUS | Status: DC
  Administered 2015-09-22: 13:00:00 via INTRAVENOUS

## 2015-09-22 MED ORDER — VALPROATE SODIUM 500 MG/5ML IV SOLN
750.0000 mg | Freq: Once | INTRAVENOUS | Status: AC
  Administered 2015-09-22: 750 mg via INTRAVENOUS
  Filled 2015-09-22: qty 7.5

## 2015-09-22 NOTE — Progress Notes (Signed)
PT Cancellation Note  Patient Details Name: Jill White MRN: [id number] DOB: September 06, 1940   Cancelled Treatment:    Reason Eval/Treat Not Completed: Medical issues which prohibited therapy (remains on bipap, just had seizure, staff into room)   Jill White,Jill White 09/22/2015, 11:35 AM Carmelia Bake, PT, DPT 09/22/2015 Pager: [phone]

## 2015-09-22 NOTE — Progress Notes (Signed)
eLink Physician-Brief Progress Note Patient Name: Jill White DOB: 1940-08-21 MRN: [id number]   Date of Service  09/22/2015  HPI/Events of Note  Hr drop, cardizem dc;ed, agree Bolus given for map 61, agree abg ordered, will follow Camera in - no distress  eICU Interventions       Intervention Category Major Interventions: Arrhythmia - evaluation and management  FEINSTEIN,DANIEL J. 09/22/2015, 11:59 PM

## 2015-09-22 NOTE — Progress Notes (Signed)
Initial Nutrition Assessment  DOCUMENTATION CODES:   Severe malnutrition in context of chronic illness  INTERVENTION:  - If pt unable to be weaned from BiPAP/vent, recommend Vital AF 1.2 @ 40 mL/hr which will provide 1152 kcal, 72 grams of protein, and 778 mL free water. - RD will continue to monitor for needs and POC  NUTRITION DIAGNOSIS:   Inadequate oral intake related to inability to eat as evidenced by NPO status.  GOAL:   Patient will meet greater than or equal to 90% of their needs  MONITOR:   Diet advancement, Weight trends, Labs, Skin, I & O's  REASON FOR ASSESSMENT:   Low Braden  ASSESSMENT:   75yo female with severe COPD, mod/severe AS initially admitted 3/26 with acute on chronic respiratory from AECOPD and decompensated heart failure. She was treated with steroids, diuresis, BD. Had worsening respiratory status on 3/29 requiring tx SDU and bipap. She improved with bipap and aggressive rx AECOPD. She tx back to floor on 4/1. On 4/3 pt had worsening hypoxia, AMS and was tx back to ICU on bipap and PCCM re-consulted.   Pt seen for low Braden. BMI indicates normal weight. Pt transferred to ICU 4/3 and NPO order placed at that time with need for BiPAP. Pt previously on Heart Healthy diet order. Granddaughter at bedside and reports all information. While on Heart Healthy diet pt was eating well and was feeling hungry/appetite at baseline. PTA pt was attempting to gain weight and was increasing carbohydrate intake, drinking nutrition supplements, and adding whey protein powder to foods.   Unable to obtain further information related to intakes, abilities with chewing or swallowing, or any other nutrition-related items at this time as tech entered to assist pt off of bedpan and perform needed cleaning related to the same.   Patient is currently on BiPAP with ventilator support; no OGT/NGT in place at this time. MV: 8.9 L/min Temp (24hrs), Avg:97.8 F (36.6 C), Min:97.4  F (36.3 C), Max:98.3 F (36.8 C) Propofol: none  TF recommendations outlined above should pt be unable to wean from BiPAP/vent support. Not meeting needs. Physical assessment shows severe muscle and fat wasting to upper and lower body. Granddaughter reports bone loss in R arm related to arthritis. Granddaughter states that despite attempts to gain weight PTA, pt had been unable. Per chart review, pt lost 6 lbs (5% body weight) in the past 5 months which is not significant for time frame. No edema noted but granddaughter reports pt often has ankle/lower leg swelling related to hx of CHF.  Medications reviewed. Labs reviewed; Cl: 71 mmol/L, BUN: 21 mg/dL, Ca: 8.8 mg/dL.   Diet Order:  Diet NPO time specified  Skin:  Reviewed, no issues  Last BM:  3/27  Height:   Ht Readings from Last 1 Encounters:  09/13/15 5\' 3"  (1.6 m)    Weight:   Wt Readings from Last 1 Encounters:  09/22/15 111 lb 5.3 oz (50.5 kg)    Ideal Body Weight:  52.27 kg (kg)  BMI:  Body mass index is 19.73 kg/(m^2).  Estimated Nutritional Needs:   Kcal:  1134  Protein:  61-76 grams (1.2-1.5 grams/kg)  Fluid:  1.5 L/day  EDUCATION NEEDS:   No education needs identified at this time     Jarome Matin, RD, LDN Inpatient Clinical Dietitian Pager # [phone] After hours/weekend pager # [phone]

## 2015-09-22 NOTE — Progress Notes (Signed)
Critical ABG values reported to E-link.  Per Dr. Elsworth Soho, trial patient off of Rheems.  Patient removed to 6L [place] with acceptable SpO2.  RR WNL, no use of accessory muscles.  RT will continue to monitor.

## 2015-09-22 NOTE — Consult Note (Signed)
Requesting Physician: Dr. Ashok Cordia    Reason for consultation:  To Manage seziures  HPI:                                                                                                                                         Jill White is an 75 y.o. female patient admitted with respiratory distress, COPD with pneumonia , was given Ativan on the floor she has some anxiety issues, and had respiratory decompensation. His transferred to the ICU. While she is in ICU, she had a witnessed general as tonic-clonic seizure. Keppra 1 g twice a day was started after that and neurology is consulted for further evaluation and management. A bedside EEG is performed which showed very frequent right parietal temporal epileptiform discharges.   no electrographic seizures were noted on the EEG.   Clinically patient does not have any abnormal convulsive activity at the time of my evaluation. She is on a BiPAP, nonverbal. History obtained from her granddaughter and son  Past Medical History: Past Medical History  Diagnosis Date  . HIP PAIN   . HYPERTENSION   . Restless leg syndrome   . COPD (chronic obstructive pulmonary disease) (HCC)     a. Multiple prior admissions for acute hypoxic respiratory failure in setting of COPD exacerbations +/- pneumonia.  . Peptic ulcer disease     a. 2013: gastric antrum.  Marland Kitchen Hyponatremia   . Tobacco abuse   . Hypomagnesemia   . LBBB (left bundle branch block)     a. Intermittent, likely rate related.  . Pulmonary HTN ([place])     a. Elevated PA pressure by prior echoes.  . Mitral regurgitation     a. Mild MR by echo 2014, not mentioned on 11/2013 (severely calcified annulus).  Marland Kitchen Respiratory failure ([place])     a. Adm 11/2013 with severe hypercarbic and hypoxemic respiratory failure requiring intubation.   . Abnormal echocardiogram     a. 11/2013: decreased EF 45-50%, moderate AS- > subsequent cath 12/02/13 with mildly calcified cors without obstruction, mild AS, EF 60%, mild pulm  HTN.  Marland Kitchen Aortic stenosis     a. 11/2013: mod by echo, then mild by cath.  . Pulmonary HTN ([place])     a. 11/2013: mild by cath.  . Wide-complex tachycardia ([place])     a. Felt likely atrial tach with rate-related LBBB.  Marland Kitchen Chronic diastolic CHF (congestive heart failure) ([place])   . Diverticulitis of colon (without mention of hemorrhage) 08/09/2013  . Septic shock ([place]) 12/11/2013  . Acute on chronic combined systolic and diastolic congestive heart failure, NYHA class 4 ([place]) 11/27/2013    Past Surgical History  Procedure Laterality Date  . Abdominal hysterectomy      vaginal hysterectomy  . Dilation and curettage of uterus    . Tonsillectomy    . Esophagogastroduodenoscopy  07/09/2011    Procedure: ESOPHAGOGASTRODUODENOSCOPY (EGD);  Surgeon: Missy Sabins, MD;  Location: [facility];  Service: Endoscopy;  Laterality: N/A;  patient in room 1532  . Cataract extraction w/ intraocular lens  implant, bilateral Bilateral 03/2013    Patient claims it was within the month.   . Left and right heart catheterization with coronary angiogram N/A 12/02/2013    Procedure: LEFT AND RIGHT HEART CATHETERIZATION WITH CORONARY ANGIOGRAM;  Surgeon: Blane Ohara, MD;  Location: [facility] CATH LAB;  Service: Cardiovascular;  Laterality: N/A;  . Laparoscopic sigmoid colectomy  07-03-14    per Dr. Malachi Carl in [place]     Family History: Family History  Problem Relation Age of Onset  . Heart disease Sister   . Heart disease Brother   . Dementia Mother   . Anemia Father     Social History:   reports that she has been smoking Cigarettes.  She has smoked for the past 50 years. She has never used smokeless tobacco. She reports that she does not drink alcohol or use illicit drugs.  Allergies:  Allergies  Allergen Reactions  . Tetanus Toxoid Anaphylaxis  . Penicillins Hives    Has patient had a PCN reaction causing immediate rash, facial/tongue/throat swelling, SOB or lightheadedness with hypotension: unknown   Has patient had a PCN reaction causing severe rash involving mucus membranes or skin necrosis: No  Has patient had a PCN reaction that required hospitalization: No  Has patient had a PCN reaction occurring within the last 10 years: No  If all of the above answers are "NO", then may proceed with Cephalosporin use.   . Codeine Nausea And Vomiting and Other (See Comments)    Itching and faint   . Erythromycin Nausea And Vomiting  . Gabapentin     REACTION: severe edema, nausea, dizziness, disorientation  . Levaquin [Levofloxacin In D5w] Other (See Comments)    Joint pain; (states she can take it IV- not by mouth)  . Meloxicam Itching  . Methylprednisolone Hives  . Prednisone Other (See Comments)    GI bleed     Medications:                                                                                                                         Current facility-administered medications:  .  0.9 %  sodium chloride infusion, , Intravenous, Continuous, Javier Glazier, MD, Last Rate: 10 mL/hr at 09/22/15 1232 .  acetaminophen (TYLENOL) tablet 650 mg, 650 mg, Oral, Q6H PRN, 650 mg at 09/20/15 1544 **OR** acetaminophen (TYLENOL) suppository 650 mg, 650 mg, Rectal, Q6H PRN, Rise Patience, MD .  albuterol (PROVENTIL) (2.5 MG/3ML) 0.083% nebulizer solution 2.5 mg, 2.5 mg, Nebulization, Q2H PRN, Rise Patience, MD, 2.5 mg at 09/21/15 0304 .  antiseptic oral rinse (CPC / CETYLPYRIDINIUM CHLORIDE 0.05%) solution 7 mL, 7 mL, Mouth Rinse, q12n4p, Orson Eva, MD, 7 mL at 09/21/15 1600 .  budesonide (PULMICORT) nebulizer solution 0.5 mg, 0.5 mg, Nebulization, BID, Marijean Heath, NP,  0.5 mg at 09/22/15 0957 .  chlorhexidine (PERIDEX) 0.12 % solution 15 mL, 15 mL, Mouth Rinse, BID, Orson Eva, MD, 15 mL at 09/21/15 2222 .  diclofenac sodium (VOLTAREN) 1 % transdermal gel 2 g, 2 g, Topical, QID, Orson Eva, MD, 2 g at 09/22/15 1327 .  diltiazem (CARDIZEM) 100 mg in dextrose 5 % 100 mL (1  mg/mL) infusion, 5-15 mg/hr, Intravenous, Titrated, Javier Glazier, MD, Last Rate: 5 mL/hr at 09/22/15 1521, 5 mg/hr at 09/22/15 1521 .  enoxaparin (LOVENOX) injection 40 mg, 40 mg, Subcutaneous, Q24H, Rise Patience, MD, 40 mg at 09/21/15 2222 .  furosemide (LASIX) injection 20 mg, 20 mg, Intravenous, Q12H, Javier Glazier, MD, 20 mg at 09/22/15 1326 .  hydrALAZINE (APRESOLINE) injection 10 mg, 10 mg, Intravenous, Q4H PRN, [name], MD, 10 mg at 09/22/15 0507 .  ipratropium-albuterol (DUONEB) 0.5-2.5 (3) MG/3ML nebulizer solution 3 mL, 3 mL, Nebulization, Q6H WA, Orson Eva, MD, 3 mL at 09/22/15 1504 .  [START ON 09/23/2015] levETIRAcetam (KEPPRA) 1,500 mg in sodium chloride 0.9 % 100 mL IVPB, 1,500 mg, Intravenous, Q12H, Jody Silas Fuller Mandril, MD .  levETIRAcetam (KEPPRA) 500 mg in sodium chloride 0.9 % 100 mL IVPB, 500 mg, Intravenous, Once, Arantxa Piercey Fuller Mandril, MD .  Living Better with Heart Failure Book, , Does not apply, Once, Orson Eva, MD .  Derrill Memo ON 09/23/2015] methylPREDNISolone sodium succinate (SOLU-MEDROL) 40 mg/mL injection 40 mg, 40 mg, Intravenous, Q24H, Javier Glazier, MD .  ondansetron [facility]) tablet 4 mg, 4 mg, Oral, Q6H PRN **OR** ondansetron (ZOFRAN) injection 4 mg, 4 mg, Intravenous, Q6H PRN, Rise Patience, MD, 4 mg at 09/21/15 1935 .  pantoprazole (PROTONIX) injection 40 mg, 40 mg, Intravenous, Q24H, Orson Eva, MD, 40 mg at 09/22/15 0939 .  prochlorperazine (COMPAZINE) injection 5 mg, 5 mg, Intravenous, Q6H PRN, Ritta Slot, NP, 5 mg at 09/18/15 2349 .  sodium chloride flush (NS) 0.9 % injection 3 mL, 3 mL, Intravenous, Q12H, Rise Patience, MD, 3 mL at 09/22/15 1000 .  valproate (DEPACON) 125 mg in dextrose 5 % 50 mL IVPB, 125 mg, Intravenous, 4 times per day, Damacio Weisgerber Fuller Mandril, MD .  valproate (DEPACON) 750 mg in dextrose 5 % 50 mL IVPB, 750 mg, Intravenous, Once, Flower Franko Fuller Mandril, MD   ROS:                                                                                                                                        History   unobtainable from patient due to on bipap    Neurologic Examination:  Today's Vitals   09/22/15 1430 09/22/15 1500 09/22/15 1506 09/22/15 1519  BP: 96/66 83/57  90/71  Pulse: 98 80  80  Temp:      TempSrc:      Resp: 22 22  16   Height:      Weight:      SpO2: 93% 94% 94% 97%  PainSc:        Evaluation of higher integrative functions including: Level of alertness: Drowsy, easily arousable to verbal command Unable to assess orientation to time, place and person, but follows commands easily Speech: Nonverbal, due to BiPAP  Test the following cranial nerves: 2-12 grossly intact Motor examination: Normal tone, bulk, full 5/5 motor strength in all 4 extremities Examination of sensation : symmetric sensation to pinprick in all 4 extremities and on face Examination of deep tendon reflexes: 2+, normal and symmetric in all extremities, normal plantars bilaterally Test coordination: Normal finger nose testing, with no evidence of limb appendicular ataxia or abnormal involuntary movements or tremors noted.  Gait: Deferred       Lab Results: Basic Metabolic Panel:  Recent Labs Lab 09/18/15 0316 09/19/15 0521 09/20/15 0507 09/21/15 0445 09/22/15 0310  NA 132* 137 139 140 141  K 3.9 3.6 3.2* 3.6 3.5  CL 83* 84* 81* 77* 71*  CO2 39* 45* >50* >50* >50*  GLUCOSE 140* 115* 126* 110* 144*  BUN 15 13 15 16  21*  CREATININE 0.43* 0.41* 0.40* 0.51 0.53  CALCIUM 8.8* 9.1 9.1 9.0 8.8*  MG  --   --   --  1.7  --     Liver Function Tests: No results for input(s): AST, ALT, ALKPHOS, BILITOT, PROT, ALBUMIN in the last 168 hours. No results for input(s): LIPASE, AMYLASE in the last 168 hours. No results for input(s): AMMONIA in the last 168 hours.  CBC:  Recent  Labs Lab 09/16/15 0433  09/17/15 0314 09/18/15 0316 09/19/15 0521 09/21/15 0445 09/22/15 0310  WBC 14.1*  < > 8.5 10.9* 10.8* 8.1 6.2  NEUTROABS 12.0*  --   --   --   --   --   --   HGB 13.6  < > 11.7* 11.9* 13.0 13.6 13.2  HCT 41.6  < > 35.9* 35.7* 40.2 45.2 44.1  MCV 95.6  < > 96.2 93.9 96.6 100.9* 102.1*  PLT 279  < > 241 258 279 256 242  < > = values in this interval not displayed.  Cardiac Enzymes: No results for input(s): CKTOTAL, CKMB, CKMBINDEX, TROPONINI in the last 168 hours.  Lipid Panel: No results for input(s): CHOL, TRIG, HDL, CHOLHDL, VLDL, LDLCALC in the last 168 hours.  CBG: No results for input(s): GLUCAP in the last 168 hours.  Microbiology: Results for orders placed or performed during the hospital encounter of 09/13/15  MRSA PCR Screening     Status: None   Collection Time: 09/16/15  8:13 PM  Result Value Ref Range Status   MRSA by PCR NEGATIVE NEGATIVE Final    Comment:        The GeneXpert MRSA Assay (FDA approved for NASAL specimens only), is one component of a comprehensive MRSA colonization surveillance program. It is not intended to diagnose MRSA infection nor to guide or monitor treatment for MRSA infections.      Imaging: Dg Chest Port 1 View  09/22/2015  CLINICAL DATA:  Respiratory failure. EXAM: PORTABLE CHEST 1 VIEW COMPARISON:  09/21/2015. FINDINGS: Mediastinum hilar structures normal. Stable cardiomegaly. Interim near complete clearing of  pulmonary interstitial edema. No focal infiltrate. Small left pleural effusion. No pneumothorax. IMPRESSION: Interim near complete clearing of pulmonary interstitial edema. Small left pleural effusion. Electronically Signed   By: Marcello Moores  Register   On: 09/22/2015 07:11   Dg Chest Port 1 View  09/21/2015  CLINICAL DATA:  Acute on chronic respiratory failure with hypoxia and hypercapnia. COPD. EXAM: PORTABLE CHEST 1 VIEW COMPARISON:  Radiographs dated 09/20/2015, 09/16/2015 and 06/23/2015 and CT scan  dated 09/13/2015 FINDINGS: There is persistent accentuation of the pulmonary vascularity and interstitial markings consistent with pulmonary edema superimposed on COPD. There is tortuosity and calcification of the thoracic aorta. No appreciable effusions. The osteopenia. No acute bone abnormality. IMPRESSION: Persistent interstitial edema superimposed on COPD. Electronically Signed   By: Lorriane Shire M.D.   On: 09/21/2015 08:15   Dg Chest Port 1 View  09/20/2015  CLINICAL DATA:  Shortness of breath and COPD. EXAM: PORTABLE CHEST 1 VIEW COMPARISON:  09/16/2015 FINDINGS: Cardiomegaly and diffuse bilateral interstitial opacities again noted. Left lower lung opacities have decreased. There is no evidence of pneumothorax. No other changes identified. IMPRESSION: Decreased left lower lung opacities, otherwise unchanged appearance chest. Electronically Signed   By: Margarette Canada M.D.   On: 09/20/2015 07:34     Assessment and plan:   Jill White is an 75 y.o. female patient with worsening respiratory status, baseline chronic COPD, admitted with pneumonia and was treated distress, anxiety problems, had a seizure this morning witnessed by the nursing staff. Routine EEG was highly abnormal with frequent right parietal temporal abnormal Discharges, No Electrographic Seizures Were Seen. This Does Indicate Increased Risk of Seizures. She was started on Keppra 1 g twice a day earlier today following her first convulsive seizure. Recommend additional 500 mg one dose now and changed her maintenance dose of Keppra to 1500 mg starting tonight. Also recommend adding Depacon 750 mg now, with a maintenance dose of 125 mg every 6 hours. We'll plan on repeating EEG tomorrow and if she continues to show abnormal discharges, likely had a prolonged long-term EEG monitoring. She'll be transferred to [facility] ICU.  Recommend MRI of the brain when she is stable for the study.  Placed on seizure precautions, when necessary Ativan for  any further breakthrough seizures.  We'll follow-up

## 2015-09-22 NOTE — Progress Notes (Addendum)
Call from Central  to Bedside RN for desating. Bedside RN came to room and pt unresponsive with gaze to right side, no commands followed. Sternal rub done, no response. CN in room now.  Pallor of pt very cyanotic, even with Bipap. Noted at that time to stare, decerecrate posture and began 30 second clonic tonic seizure with body jerks. And cliching and gritting teeth. Remained on Bipap during seizure, observed closely by this RN. Post ictal peroid with no response, saturations returned to baseline at 95%. Heartrate reading 170's during seizure however returning to 80's with AFIB post ictal peroid.Dr Milinda Hirschfeld in room with orders.

## 2015-09-22 NOTE — Progress Notes (Signed)
PULMONARY / CRITICAL CARE MEDICINE   Name: Jill White MRN: [id number] DOB: 1941-01-01    ADMISSION DATE:  09/13/2015 CONSULTATION DATE:  3/29  REFERRING MD: TAT  CHIEF COMPLAINT:    HISTORY OF PRESENT ILLNESS:   75yo female with severe COPD, mod/severe AS initially admitted 3/26 with acute on chronic respiratory from AECOPD and decompensated heart failure.  She was treated with steroids, diuresis, BD.  Had worsening respiratory status on 3/29 requiring tx SDU and bipap.  She improved with bipap and aggressive rx AECOPD.  She tx back to floor on 4/1.  On 4/3 pt had worsening hypoxia, AMS and was tx back to ICU on bipap and PCCM re-consulted.    SUBJECTIVE: Patient did have agitation overnight requiring IV Haldol. More awake this morning. Patient denies any pain or difficulty breathing on BiPAP with a head nod. Granddaughter at bedside.  REVIEW OF SYSTEMS:  Unable to obtain currently with patient on BiPAP.  VITAL SIGNS: BP 180/79 mmHg  Pulse 86  Temp(Src) 97.4 F (36.3 C) (Axillary)  Resp 18  Ht 5\' 3"  (1.6 m)  Wt 111 lb 5.3 oz (50.5 kg)  BMI 19.73 kg/m2  SpO2 97%  HEMODYNAMICS:    VENTILATOR SETTINGS: Vent Mode:  [-] Other (Comment) FiO2 (%):  [40 %-55 %] 50 % Set Rate:  [12 bmp] 12 bmp PEEP:  [7 cmH20] 7 cmH20  INTAKE / OUTPUT: I/O last 3 completed shifts: In: 640 [P.O.:240; IV Piggyback:400] Out: 975 [Urine:975]   PHYSICAL EXAMINATION: General:  Awake. No acute distress. Granddaughter at bedside.  Integument:  Warm & dry. No rash on exposed skin.  HEENT:  No scleral injection or icterus. BiPAP mask in place. Cardiovascular:  Irregular rhythm and intermittently tachycardic. No edema. No appreciable JVD.  Pulmonary:  Good aeration & clear to auscultation bilaterally. Normal work of breathing on BiPAP. Abdomen: Soft. Normal bowel sounds. Nondistended.  Neurological: Following commands. Nodding to questions. Grossly nonfocal moving all 4  extremities.  LABS: ABG    Component Value Date/Time   PHART 7.392 09/21/2015 1824   PCO2ART 99.8* 09/21/2015 1824   PO2ART 75.8* 09/21/2015 1824   HCO3 59.3* 09/21/2015 1824   TCO2 53.2 09/21/2015 1824   O2SAT 95.5 09/21/2015 1824    BMET  Recent Labs Lab 09/20/15 0507 09/21/15 0445 09/22/15 0310  NA 139 140 141  K 3.2* 3.6 3.5  CL 81* 77* 71*  CO2 >50* >50* >50*  BUN 15 16 21*  CREATININE 0.40* 0.51 0.53  GLUCOSE 126* 110* 144*    Electrolytes  Recent Labs Lab 09/20/15 0507 09/21/15 0445 09/22/15 0310  CALCIUM 9.1 9.0 8.8*  MG  --  1.7  --     CBC  Recent Labs Lab 09/19/15 0521 09/21/15 0445 09/22/15 0310  WBC 10.8* 8.1 6.2  HGB 13.0 13.6 13.2  HCT 40.2 45.2 44.1  PLT 279 256 242    Coag's No results for input(s): APTT, INR in the last 168 hours.  Sepsis Markers  Recent Labs Lab 09/16/15 0433 09/16/15 0537 09/18/15 0316 09/20/15 0507  LATICACIDVEN  --  0.6  --   --   PROCALCITON <0.10  --  <0.10 <0.10    ABG  Recent Labs Lab 09/21/15 0748 09/21/15 1100 09/21/15 1824  PHART 7.147* 7.377 7.392  PCO2ART VALUE TO HIGH TO READ 94.7* 99.8*  PO2ART 105* 66.4* 75.8*    Liver Enzymes No results for input(s): AST, ALT, ALKPHOS, BILITOT, ALBUMIN in the last 168 hours.  Cardiac Enzymes  No results for input(s): TROPONINI, PROBNP in the last 168 hours.  Glucose No results for input(s): GLUCAP in the last 168 hours.  Imaging Dg Chest Port 1 View  09/22/2015  CLINICAL DATA:  Respiratory failure. EXAM: PORTABLE CHEST 1 VIEW COMPARISON:  09/21/2015. FINDINGS: Mediastinum hilar structures normal. Stable cardiomegaly. Interim near complete clearing of pulmonary interstitial edema. No focal infiltrate. Small left pleural effusion. No pneumothorax. IMPRESSION: Interim near complete clearing of pulmonary interstitial edema. Small left pleural effusion. Electronically Signed   By: Marcello Moores  Register   On: 09/22/2015 07:11    STUDIES:  3/26 CXR >>  Chronic changes, no acute findings  3/26 CTA Chest >> No acute findings 3/27 MR T-Spine >> Acute compression fracture involving the T8 vertebral body, small layering bilateral pleural effusions   3/27 Echo >> EF 45-50%, moderate aortic stenosis  3/29 CXR >> Left basilar airspace opacity, concerning for PNA, borderline cardiomegaly 4/04 CXR >> Significant improvement in bilateral interstitial markings/pulmonary edema. Blunting left costophrenic angle suggestive of small pleural effusion.  MICROBIOLOGY: MRSA PCR 3/29:  Negative   ANTIBIOTICS: Levaquin 3/31>>> Cefepime 3/29 - 3/31 Vancomycin 3/29 - 3/31  SIGNIFICANT EVENTS: 3/26 >> To ED with Increase SOB, upper back pain, swelling to lower extremities   3/29 >> Rapid response called, hypoxic in the 70s, HR 130-140s, lethargic  4/03 >> Rapid response called w/ lethargy, hypoxic in 50s, HR 120s & BP 94/49  ASSESSMENT / PLAN:  Patient with acute on chronic hypoxic & hypercarbic respiratory failure. Mental status significantly improved today. Patient is having more of an increased heart rate with atrial fibrillation having missed her evening dose of diltiazem. Respiratory status has significantly improved with diuresis which is evidenced based on chest x-ray today. I believe switching to oral Lasix is reasonable. Traveling the patient off of BiPAP at this time with reinitiation of oral medications.  1. 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 Continuing scheduled budesonide bid & Duoneb q6hr. Changing Solu-Medrol to 40mg  IV daily starting tomorrow. 2. Acute on chronic hypoxic respiratory failure: Lasix 40mg  po bid. Close monitoring of renal function. 3. Acute encephalopathy: Resolved. Likely secondary to hypercarbia. Holding benzodiazepines. Haldol IV prn. 4. Severe Aortic Stenosis:  Cautious diuresis. 5. Atrial Fibrillation w/ RVR:  Monitor on telemetry. Rescheduling Cardizem to AM. 6. Hypertension:   Hydralazine IV prn. PO Lasix & Cardizem. 7. Acute T8 Compression Fracture:  Holding pain medication given altered mental status. 8. Prophylaxis:  Protonix IV qday & Lovenox [place] daily.  Sonia Baller Ashok Cordia, M.D. [facility] Pulmonary & Critical Care Pager:  [phone] After 3pm or if no response, call [phone] 9:56 AM 09/22/2015

## 2015-09-22 NOTE — Progress Notes (Signed)
Cardizem stopped due to patient heart rate dropping to 40s/50s. Patient also no longer in a-fib.

## 2015-09-22 NOTE — Progress Notes (Signed)
PCCM Attending Note: Called to bedside because patient desaturated during full body tonic-clonic seizure activity. Seizure stopped spontaneously. Granddaughter at bedside confirms patient has no prior history of seizure activity. Patient desaturated only during the seizure. Patient now moving with stimulation in all extremities. Pupils forward & symmetric.   1. Keppra 1000mg  IV q12hr 1st dose now 2. Seizure Precautions 3. Stat EEG 4. Neurology Consult 5. D/C Levaquin  Sonia Baller. Ashok Cordia, M.D. [facility] Pulmonary & Critical Care Pager:  [phone] After 3pm or if no response, call [phone] 11:42 AM 09/22/2015

## 2015-09-22 NOTE — Progress Notes (Signed)
EEG completed, results pending. 

## 2015-09-22 NOTE — Procedures (Signed)
History: Jill White is an 75 y.o. female patient with  Seizure . Routine inpatient EEG was performed for further evaluation.   Patient Active Problem List   Diagnosis Date Noted  . Protein-calorie malnutrition, severe 09/22/2015  . Acute on chronic respiratory failure with hypoxia and hypercapnia (HCC)   . Encephalopathy acute   . (HFpEF) heart failure with preserved ejection fraction ([place])   . HCAP (healthcare-associated pneumonia) 09/16/2015  . Chronic obstructive pulmonary disease ([place])   . SOB (shortness of breath)   . Acute on chronic respiratory failure ([place]) 09/13/2015  . Acute respiratory failure ([place]) 09/13/2015  . Arthritis of right shoulder region 09/03/2015  . Chronic obstructive pulmonary disease, unspecified COPD, unspecified chronic bronchitis type 05/18/2015  . Acute sinusitis 05/18/2015  . History of panic attacks 02/19/2015  . 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 Cachexia ([place]) 07/23/2014  . Smoking 07/23/2014  . Chronic respiratory failure with hypercapnia ([place]) 07/12/2014  . CN (constipation)   . Restless leg syndrome   . Palliative care encounter   . Compression fracture of thoracic spine, non-traumatic ([place])   . Back pain 05/12/2014  . Chest pain 05/12/2014  . Compression deformity of vertebra 05/12/2014  . LBBB (left bundle branch block) 05/12/2014  . Thoracic spine fracture ([place]) 05/12/2014  . Non-traumatic compression fracture of T6 thoracic vertebra 05/12/2014  . 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 COLD (chronic obstructive lung disease) ([place]) 01/24/2014  . Mild aortic stenosis 11/27/2013  . CAP (community acquired pneumonia) 04/29/2013  . Dehydration with hyponatremia 06/15/2012  . COPD exacerbation ([place]) 06/15/2012  . Duodenal ulcer 07/12/2011  . Hyponatremia 07/06/2011  . Abdominal pain 07/06/2011  . RLS (restless legs syndrome) 07/06/2011  . Cigarette smoker 07/06/2011   . COPD (chronic obstructive pulmonary disease) ([place]) 07/06/2011  . HIP PAIN 03/06/2007  . Essential hypertension 02/09/2007     Current facility-administered medications:  .  0.9 %  sodium chloride infusion, , Intravenous, Continuous, Javier Glazier, MD, Last Rate: 10 mL/hr at 09/22/15 1232 .  acetaminophen (TYLENOL) tablet 650 mg, 650 mg, Oral, Q6H PRN, 650 mg at 09/20/15 1544 **OR** acetaminophen (TYLENOL) suppository 650 mg, 650 mg, Rectal, Q6H PRN, Rise Patience, MD .  albuterol (PROVENTIL) (2.5 MG/3ML) 0.083% nebulizer solution 2.5 mg, 2.5 mg, Nebulization, Q2H PRN, Rise Patience, MD, 2.5 mg at 09/21/15 0304 .  antiseptic oral rinse (CPC / CETYLPYRIDINIUM CHLORIDE 0.05%) solution 7 mL, 7 mL, Mouth Rinse, q12n4p, Orson Eva, MD, 7 mL at 09/21/15 1600 .  budesonide (PULMICORT) nebulizer solution 0.5 mg, 0.5 mg, Nebulization, BID, Marijean Heath, NP, 0.5 mg at 09/22/15 1937 .  chlorhexidine (PERIDEX) 0.12 % solution 15 mL, 15 mL, Mouth Rinse, BID, Orson Eva, MD, 15 mL at 09/21/15 2222 .  diclofenac sodium (VOLTAREN) 1 % transdermal gel 2 g, 2 g, Topical, QID, Orson Eva, MD, 2 g at 09/22/15 1327 .  diltiazem (CARDIZEM) 100 mg in dextrose 5 % 100 mL (1 mg/mL) infusion, 5-15 mg/hr, Intravenous, Titrated, Javier Glazier, MD, Last Rate: 5 mL/hr at 09/22/15 1700, 5 mg/hr at 09/22/15 1700 .  enoxaparin (LOVENOX) injection 40 mg, 40 mg, Subcutaneous, Q24H, Rise Patience, MD, 40 mg at 09/21/15 2222 .  furosemide (LASIX) injection 20 mg, 20 mg, Intravenous, Q12H, Javier Glazier, MD, 20 mg at 09/22/15 1326 .  hydrALAZINE (APRESOLINE) injection 10 mg, 10 mg, Intravenous, Q4H PRN, [name], MD, 10 mg  at 09/22/15 0507 .  ipratropium-albuterol (DUONEB) 0.5-2.5 (3) MG/3ML nebulizer solution 3 mL, 3 mL, Nebulization, Q6H WA, Orson Eva, MD, 3 mL at 09/22/15 1922 .  [START ON 09/23/2015] levETIRAcetam (KEPPRA) 1,500 mg in sodium chloride 0.9 % 100 mL IVPB, 1,500 mg,  Intravenous, Q12H, Donovan Persley Fuller Mandril, MD .  Living Better with Heart Failure Book, , Does not apply, Once, Orson Eva, MD .  Derrill Memo ON 09/23/2015] methylPREDNISolone sodium succinate (SOLU-MEDROL) 40 mg/mL injection 40 mg, 40 mg, Intravenous, Q24H, Javier Glazier, MD .  ondansetron [facility]) tablet 4 mg, 4 mg, Oral, Q6H PRN **OR** ondansetron (ZOFRAN) injection 4 mg, 4 mg, Intravenous, Q6H PRN, Rise Patience, MD, 4 mg at 09/21/15 1935 .  pantoprazole (PROTONIX) injection 40 mg, 40 mg, Intravenous, Q24H, Orson Eva, MD, 40 mg at 09/22/15 0939 .  prochlorperazine (COMPAZINE) injection 5 mg, 5 mg, Intravenous, Q6H PRN, Ritta Slot, NP, 5 mg at 09/18/15 2349 .  sodium chloride flush (NS) 0.9 % injection 3 mL, 3 mL, Intravenous, Q12H, Rise Patience, MD, 3 mL at 09/22/15 1000 .  [START ON 09/23/2015] valproate (DEPACON) 125 mg in dextrose 5 % 50 mL IVPB, 125 mg, Intravenous, 4 times per day, Charlsey Moragne Fuller Mandril, MD   Introduction:  This is a 19 channel routine scalp EEG performed at the bedside with bipolar and monopolar montages arranged in accordance to the international 10/20 system of electrode placement. One channel was dedicated to EKG recording.   Findings:  Mild generalized background slowing, mostly in the range of 6-7 Hz noted. Frequent abnormal epileptiform discharges in the form of sharps with phase reversals were seen in the right parietal and temporal leads. No evidence of electrographic seizures were noted during this recording.   Impression:  Abnormal routine inpatient EEG suggestive of neuronal dysfunction with underlying epileptogenic potential in the right parietotemporal region, with superimposed mild generalized encephalopathy. Clinical correlation is recommended .

## 2015-09-22 NOTE — Progress Notes (Signed)
PROGRESS NOTE  Jill White [id number] DOB: [birth year] DOA: 09/13/2015 PCP: Laurey Morale, MD  Outpatient specialists--[facility]  Brief History 75 year old female with history of hypertension, COPD, moderate aortic stenosis, tachycardia presented with increasing shortness of breath after developing new onset upper back pain. There was no history of trauma or injury. Pain in the upper back appeared to be worsening with deep inspiration and movement. The pain resulted in increasing shortness of breath. She was initially treated for HAP and COPD exacerbation.   On the early morning of 09/16/2015, the patient developed respiratory distress and was placed on BiPAP. Her respiratory failure was multifactorial including HAP, COPD exacerbation, and hypercarbia from hypnotic meds. CCM was consulted and pt was started on IV steroids and abx. The patient improved clinically and was weaned off of BiPAP. She was subsequently transferred to telemetry unit, and her antibiotics were de-escalated.  Morning of 09/21/15, pt was found obtunded and oxygen sats in 60s. During the night of 09/21/15, pt c/o of increased anxiety and panic attack. She was given one time dose of ativan on top of her klonopin scheduled dose (which had been cut in half from prior home dose) and her [place] was increased to 4L [place]. Around 7AM on 09/21/15, pt was found obtunded with low sats. When I arrived, pt was already on NRB. She was transferred to ICU and placed on BiPAP.PCCM was reconsulted.  On 09/22/15, the patient's respiratory status had improved, and plans were made to remove the patient off of BiPAP. Unfortunately, she developed tonic-clonic seizure activity lasting approximately 30-60 seconds followed by a postictal state. Therefore, the decision was made to keep her on BiPAP and remained on intravenous medications. Assessment/Plan: Acute on chronic respiratory failure with hypoxia and  hypercarbia -Secondary to HAP and CHF in the setting of mod-severe aortic stenosis and COPD--now due to hypoventilation from benzos -pt had component of fluid overload with edema on CXR and peripheral edema-->furosemide per cardiology--stable on lasix 40 mg daily during hospitalization -continue IV steroids-->po prednisone on 3/31-pt adamantly refuses po prednisone as it caused GIB, but willing to take IV steroids and -continue pulmicort and Duonebs -D/C all opioid/hypnotic medications -speech therapy evaluation-->regular diet -09/21/15--recurrent respiratory failure--moved back to SDU and placed on Bipap; PCCM reconsulted -09/21/15 respiratory failure due to hynoptic meds and pulmonary edema -improved with IV Lasix 40 IV bid x 2 doses-->20 IV BID -D/C all hypnotics and opioids again-->haldol for agitation -granddaughter/family appears to have unrealistic expectations  HAP -vanco and cefepime started 3/29-->3/31 -09/18/15-->start levofloxacin (pt adamant she cannot tolerate po???)--total abx D#7 -procalcitonin <0.10 -lactic acid 0.6 -finished 7 days of abx  Seizure -levaquin likely decreased seizure threshold -d/c levaquin -loaded with Keppra -PCCM consulted neurology already  COPD exacerbation -pt is adamant she cannot take prednisone although "GI bleed" is the listed allergy -continue solu-medrol, decrease to once daily 4/1 and monitor clinically -09/23/15-decrease solumedrol to 40 mg IV daily  Acute on chronic systolic CHF -daily weight -maintain neg fluid balance -continue IV lasix -change to po lasix when off BiPAP -09/14/15 Echo--EF 45-50, diffuse HK  T6 and T8 compression fracture -09/13/2015 CT angio chest negative for pulmonary embolus -09/14/2015 MRI thoracic spine acute T8 compression fracture -D/C opioid/hypnotic meds for now -voltaren gel only  Moderate to severe aortic stenosis -Appreciate cardiology follow-up -ultimately need evaluation for TAVR  Right shoulder  pain -Recent steroid injection 09/03/2015 -Ultrasound of the shoulder reveals subacromial bursitis, moderate to  severe arthritis -Symptomatically treatment-->voltaren gel -I have explained to the patient why we are trying to minimize opioids -pt refuses physical therapy  Tachycardia/Atrial tach -Recent Holter monitor reveals brief runs of atrial tachycardia, sinus rhythm with Wenckebach AV block -Patient has chronic LBBB -placed on IV diltiazem while on bipap -switch back to po diltiazem when able to tolerate po again  Tobacco abuse -Cessation discussed -no desire to quit  Hyponatremia -likely due to CHF -continue lasix-->improved  Restless Leg Syndrome -restart lower dose klonopin 3/31-->now d/c altogether  Family Communication:Granddaughter and daughter updated at beside 4/4 Disposition Plan: remain in SDU    Procedures/Studies: Dg Shoulder Right  09/15/2015  CLINICAL DATA:  Chronic right shoulder pain.  No known injury. EXAM: RIGHT SHOULDER - 2+ VIEW COMPARISON:  Chest CT 09/13/2015. FINDINGS: Moderate AC joint degenerative changes. Mild glenohumeral joint degenerative changes. No acute bony abnormality or worrisome bone lesion. The visualize right ribs are intact. The visualize right lung is clear. Stable emphysematous changes and pulmonary scarring. IMPRESSION: AC joint and glenohumeral joint degenerative changes but no acute bony findings. Electronically Signed   By: Marijo Sanes M.D.   On: 09/15/2015 14:12   Ct Angio Chest Pe W/cm &/or Wo Cm  09/13/2015  CLINICAL DATA:  75 year old female with shortness of breath and upper back pain. EXAM: CT ANGIOGRAPHY CHEST WITH CONTRAST TECHNIQUE: Multidetector CT imaging of the chest was performed using the standard protocol during bolus administration of intravenous contrast. Multiplanar CT image reconstructions and MIPs were obtained to evaluate the vascular anatomy. CONTRAST:  113mL OMNIPAQUE IOHEXOL 350 MG/ML SOLN COMPARISON:   Chest radiograph dated 09/13/2015 FINDINGS: There is emphysematous changes of the lungs. No focal consolidation, pleural effusion, or pneumothorax. The central airways are patent. There is atherosclerotic calcification of the thoracic aorta. No CT evidence of pulmonary embolism. There is no cardiomegaly or pericardial effusion. There is coronary vascular calcification. There is no hilar or mediastinal adenopathy. The esophagus and the thyroid gland appear unremarkable. There is no axillary adenopathy. The chest wall soft tissues appear unremarkable. There is osteopenia with degenerative changes of the spine. C6 compression fracture with anterior wedging, likely old. There is T8 compression fracture with mild sclerotic changes, age indeterminate, likely old. Clinical correlation is recommended. The visualized upper abdomen appears unremarkable. Review of the MIP images confirms the above findings. IMPRESSION: No CT evidence of pulmonary embolism. Osteopenia with multilevel compression fractures of the thoracic spine, age indeterminate, likely old. Clinical correlation is recommended. Electronically Signed   By: Anner Crete M.D.   On: 09/13/2015 23:43   Mr Thoracic Spine Wo Contrast  09/14/2015  CLINICAL DATA:  Initial evaluation for acute back pain EXAM: MRI THORACIC SPINE WITHOUT CONTRAST TECHNIQUE: Multiplanar, multisequence MR imaging of the thoracic spine was performed. No intravenous contrast was administered. COMPARISON:  Prior CT from 09/13/2015. FINDINGS: Limited views of the cervical spine demonstrate multilevel degenerative spondylolysis without severe stenosis. Vertebral bodies are normally aligned with preservation of the normal thoracic kyphosis. Chronic anterior wedging deformity of the T6 vertebral body with without significant retropulsion. There is abnormal T1 hypo intense, T2/STIR hyperintense signal intensity within the T8 vertebral body, consistent with acute compression fracture. Mild 30%  of central height loss without bony retropulsion. No other acute or subacute appearing compression fracture identified. No listhesis or malalignment. Signal intensity within the vertebral body bone marrow within normal limits. Small subcentimeter lesion with the T11 vertebral body noted, indeterminate, but may reflect a small hemangioma. Signal intensity within the visualized  thoracic cord is normal. Please note that the distal cord is not visualized on this exam. No acute paraspinous soft tissue abnormality. Small layering bilateral pleural effusions noted. Heavy atheromatous plaque within the visualized aorta. Intraluminal fluid noted within the visualized esophagus . T1-2: Mild disc bulge and posterior element hypertrophy. No significant stenosis. T2-3: Mild disc bulge and posterior element hypertrophy. No significant stenosis. T3-4:  Mild broad-based disc bulge without significant stenosis. T4-5:  Minimal disc bulge without stenosis. T5-6:  Negative. T6-7: Mild bony retropulsion related to the chronic T6 vertebral body fracture. Superimposed tiny central disc protrusion. Minimal cord flattening without cord signal changes. Mild posterior element hypertrophy. Very mild canal stenosis. T7-T8:  Negative. T8-9:  Mild posterior element hypertrophy without stenosis. T9-10: Mild posterior element hypertrophy without stenosis. No significant disc bulge. T10-11: Posterior element hypertrophy without stenosis. No significant disc bulge. T11-12: Bilateral facet arthrosis without significant stenosis. No significant disc bulge. T12-L1: Seen only on sagittal projection. Disc bulge with reactive endplate changes. No significant stenosis. IMPRESSION: 1. Acute compression fracture involving the T8 vertebral body with associated mild 30% height loss without bony retropulsion. 2. No other acute abnormality within the thoracic spine. 3. Chronic compression deformity of T6 with resultant mild canal stenosis. 4. Multilevel  degenerative spondylolysis without significant stenosis. Please see above report for a full description these findings. 5. Small layering bilateral pleural effusions. Electronically Signed   By: Jeannine Boga M.D.   On: 09/14/2015 22:42   [place] Extrem Up Right Ltd  09/07/2015  MS[facility] US performed of: Right This study was ordered, performed, and interpreted by Charlann Boxer D.O. Shoulder:  Supraspinatus: Degenerative changes noted but no true acute tear. Patient does have bursal bulge. Underlying arthritic changes noted Infraspinatus: Appears normal on long and transverse views. Significant increase in Doppler flow Subscapularis: Degenerative changes noted. Positive bursa Teres Minor: Appears normal on long and transverse views. AC joint: Moderate arthritis Glenohumeral Joint: Moderate arthritis. Glenoid Labrum: Intact without visualized tears. Biceps Tendon: Appears normal on long and transverse views, no fraying of tendon, tendon located in intertubercular groove, no subluxation with shoulder internal or external rotation. Impression: Subacromial bursitis, moderate to severe underlying arthritic changes. Procedure: Real-time Ultrasound Guided Injection of right glenohumeral joint Device: GE Logiq E  Ultrasound guided injection is preferred based studies that show increased duration, increased effect, greater accuracy, decreased procedural pain, increased response rate with ultrasound guided versus blind injection.  Verbal informed consent obtained.  Time-out conducted.  Noted no overlying erythema, induration, or other signs of local infection.  Skin prepped in a sterile fashion.  Local anesthesia: Topical Ethyl chloride.  With sterile technique and under real time ultrasound guidance: Joint visualized. 23g 1  inch needle inserted posterior approach. Pictures taken for needle placement. Patient did have injection of 2 cc of 1% lidocaine, 2 cc of 0.5% Marcaine, and 1.0 cc of Kenalog 40 mg/dL.  Completed without difficulty  Pain immediately resolved suggesting accurate placement of the medication.  Advised to call if fevers/chills, erythema, induration, drainage, or persistent bleeding.  Images permanently stored and available for review in the ultrasound unit.  Impression: Technically successful ultrasound guided injection.  Dg Chest Port 1 View  09/22/2015  CLINICAL DATA:  Respiratory failure. EXAM: PORTABLE CHEST 1 VIEW COMPARISON:  09/21/2015. FINDINGS: Mediastinum hilar structures normal. Stable cardiomegaly. Interim near complete clearing of pulmonary interstitial edema. No focal infiltrate. Small left pleural effusion. No pneumothorax. IMPRESSION: Interim near complete clearing of pulmonary interstitial edema. Small left pleural effusion. Electronically  Signed   By: Marcello Moores  Register   On: 09/22/2015 07:11   Dg Chest Port 1 View  09/21/2015  CLINICAL DATA:  Acute on chronic respiratory failure with hypoxia and hypercapnia. COPD. EXAM: PORTABLE CHEST 1 VIEW COMPARISON:  Radiographs dated 09/20/2015, 09/16/2015 and 06/23/2015 and CT scan dated 09/13/2015 FINDINGS: There is persistent accentuation of the pulmonary vascularity and interstitial markings consistent with pulmonary edema superimposed on COPD. There is tortuosity and calcification of the thoracic aorta. No appreciable effusions. The osteopenia. No acute bone abnormality. IMPRESSION: Persistent interstitial edema superimposed on COPD. Electronically Signed   By: Lorriane Shire M.D.   On: 09/21/2015 08:15   Dg Chest Port 1 View  09/20/2015  CLINICAL DATA:  Shortness of breath and COPD. EXAM: PORTABLE CHEST 1 VIEW COMPARISON:  09/16/2015 FINDINGS: Cardiomegaly and diffuse bilateral interstitial opacities again noted. Left lower lung opacities have decreased. There is no evidence of pneumothorax. No other changes identified. IMPRESSION: Decreased left lower lung opacities, otherwise unchanged appearance chest. Electronically Signed    By: Margarette Canada M.D.   On: 09/20/2015 07:34   Dg Chest Port 1 View  09/16/2015  CLINICAL DATA:  Acute onset of shortness of breath and decreased O2 saturation. Initial encounter. EXAM: PORTABLE CHEST 1 VIEW COMPARISON:  Chest radiograph and CTA of the chest performed 09/13/2015 FINDINGS: New left basilar airspace opacity is concerning for pneumonia. Increased vascular congestion is noted, and superimposed interstitial edema cannot be excluded. No definite pleural effusion or pneumothorax is seen. The cardiomediastinal silhouette is borderline enlarged. No acute osseous abnormalities are identified. IMPRESSION: New left basilar airspace opacity is concerning for pneumonia. Increased vascular congestion noted, with borderline cardiomegaly. Superimposed interstitial edema cannot be excluded. Electronically Signed   By: Garald Balding M.D.   On: 09/16/2015 04:44   Dg Chest Portable 1 View  09/13/2015  CLINICAL DATA:  Shortness of breath, bilateral lower extremity edema EXAM: PORTABLE CHEST 1 VIEW COMPARISON:  06/23/2015 FINDINGS: Increased interstitial markings, chronic. Mild cephalization after. This appearance suggests chronic compensated CHF without frank interstitial edema. No pleural effusion or pneumothorax. The heart is top-normal in size. IMPRESSION: Chronic interstitial markings.  No frank interstitial edema. No definite pleural effusions. Electronically Signed   By: Julian Hy M.D.   On: 09/13/2015 16:34         Subjective: The patient is awake and alert but somewhat confused and intermittently agitated on BiPAP. No reports of respiratory distress, diarrhea, uncontrolled pain.  Objective: Filed Vitals:   09/22/15 1000 09/22/15 1100 09/22/15 1206 09/22/15 1224  BP: 124/53 112/34    Pulse: 85 88    Temp:    97.9 F (36.6 C)  TempSrc:      Resp: 14 20    Height:      Weight:      SpO2: 98% 97% 92%     Intake/Output Summary (Last 24 hours) at 09/22/15 1346 Last data filed at  09/22/15 1300  Gross per 24 hour  Intake 257.92 ml  Output    200 ml  Net  57.92 ml   Weight change:  Exam:   General:  Pt is alert, follows commands appropriately, not in acute distress  HEENT: No icterus, No thrush, No neck mass, [place]/AT  Cardiovascular: RRR, S1/S2, no rubs, no gallops  Respiratory:b ibasilar rales. No wheezes. Good air movement.  Abdomen: Soft/+BS, non tender, non distended, no guarding; no hepatosplenomegaly  Extremities: No edema, No lymphangitis, No petechiae, No rashes, no synovitis  Data Reviewed: Basic  Metabolic Panel:  Recent Labs Lab 09/18/15 0316 09/19/15 0521 09/20/15 0507 09/21/15 0445 09/22/15 0310  NA 132* 137 139 140 141  K 3.9 3.6 3.2* 3.6 3.5  CL 83* 84* 81* 77* 71*  CO2 39* 45* >50* >50* >50*  GLUCOSE 140* 115* 126* 110* 144*  BUN 15 13 15 16  21*  CREATININE 0.43* 0.41* 0.40* 0.51 0.53  CALCIUM 8.8* 9.1 9.1 9.0 8.8*  MG  --   --   --  1.7  --    Liver Function Tests: No results for input(s): AST, ALT, ALKPHOS, BILITOT, PROT, ALBUMIN in the last 168 hours. No results for input(s): LIPASE, AMYLASE in the last 168 hours. No results for input(s): AMMONIA in the last 168 hours. CBC:  Recent Labs Lab 09/16/15 0433  09/17/15 0314 09/18/15 0316 09/19/15 0521 09/21/15 0445 09/22/15 0310  WBC 14.1*  < > 8.5 10.9* 10.8* 8.1 6.2  NEUTROABS 12.0*  --   --   --   --   --   --   HGB 13.6  < > 11.7* 11.9* 13.0 13.6 13.2  HCT 41.6  < > 35.9* 35.7* 40.2 45.2 44.1  MCV 95.6  < > 96.2 93.9 96.6 100.9* 102.1*  PLT 279  < > 241 258 279 256 242  < > = values in this interval not displayed. Cardiac Enzymes: No results for input(s): CKTOTAL, CKMB, CKMBINDEX, TROPONINI in the last 168 hours. BNP: Invalid input(s): POCBNP CBG: No results for input(s): GLUCAP in the last 168 hours.  Recent Results (from the past 240 hour(s))  MRSA PCR Screening     Status: None   Collection Time: 09/16/15  8:13 PM  Result Value Ref Range Status   MRSA  by PCR NEGATIVE NEGATIVE Final    Comment:        The GeneXpert MRSA Assay (FDA approved for NASAL specimens only), is one component of a comprehensive MRSA colonization surveillance program. It is not intended to diagnose MRSA infection nor to guide or monitor treatment for MRSA infections.      Scheduled Meds: . antiseptic oral rinse  7 mL Mouth Rinse q12n4p  . budesonide (PULMICORT) nebulizer solution  0.5 mg Nebulization BID  . chlorhexidine  15 mL Mouth Rinse BID  . diclofenac sodium  2 g Topical QID  . enoxaparin (LOVENOX) injection  40 mg Subcutaneous Q24H  . furosemide  20 mg Intravenous Q12H  . ipratropium-albuterol  3 mL Nebulization Q6H WA  . levETIRAcetam  1,000 mg Intravenous Q12H  . Living Better with Heart Failure Book   Does not apply Once  . [START ON 09/23/2015] methylPREDNISolone (SOLU-MEDROL) injection  40 mg Intravenous Q24H  . pantoprazole (PROTONIX) IV  40 mg Intravenous Q24H  . sodium chloride flush  3 mL Intravenous Q12H   Continuous Infusions: . sodium chloride 10 mL/hr at 09/22/15 1232  . diltiazem (CARDIZEM) infusion 7.5 mg/hr (09/22/15 1258)     Milanie Rosenfield, DO  [facility] Pager [phone]  If 7PM-7AM, please contact night-coverage [email or web address] Password [email or web address] 09/22/2015, 1:46 PM   LOS: 9 days

## 2015-09-23 ENCOUNTER — Inpatient Hospital Stay (HOSPITAL_COMMUNITY): Payer: Medicare Other

## 2015-09-23 LAB — BLOOD GAS, ARTERIAL
Bicarbonate: 65.5 mEq/L — ABNORMAL HIGH (ref 20.0–24.0)
Drawn by: 44956
FIO2: 0.4
O2 SAT: 94.5 %
PATIENT TEMPERATURE: 98.6
PCO2 ART: 98.3 mmHg — AB (ref 35.0–45.0)
PO2 ART: 74.6 mmHg — AB (ref 80.0–100.0)
TCO2: 68.5 mmol/L (ref 0–100)
pH, Arterial: 7.439 (ref 7.350–7.450)

## 2015-09-23 LAB — BASIC METABOLIC PANEL
Anion gap: 12 (ref 5–15)
BUN: 21 mg/dL — AB (ref 6–20)
CALCIUM: 8.8 mg/dL — AB (ref 8.9–10.3)
CHLORIDE: 85 mmol/L — AB (ref 101–111)
CO2: 45 mmol/L — AB (ref 22–32)
CREATININE: 0.44 mg/dL (ref 0.44–1.00)
GFR calc Af Amer: >60 mL/min (ref >60)
GFR calc non Af Amer: >60 mL/min (ref >60)
GLUCOSE: 97 mg/dL (ref 65–99)
Potassium: 3.5 mmol/L (ref 3.5–5.1)
Sodium: 142 mmol/L (ref 135–145)

## 2015-09-23 LAB — MAGNESIUM: MAGNESIUM: 1.9 mg/dL (ref 1.7–2.4)

## 2015-09-23 LAB — GLUCOSE, CAPILLARY: Glucose-Capillary: 104 mg/dL — ABNORMAL HIGH (ref 65–99)

## 2015-09-23 MED ORDER — SODIUM CHLORIDE 0.9 % IV BOLUS (SEPSIS)
500.0000 mL | Freq: Once | INTRAVENOUS | Status: AC
  Administered 2015-09-23: 500 mL via INTRAVENOUS

## 2015-09-23 MED ORDER — ACETAZOLAMIDE SODIUM 500 MG IJ SOLR
250.0000 mg | Freq: Four times a day (QID) | INTRAMUSCULAR | Status: AC
  Administered 2015-09-23 (×3): 250 mg via INTRAVENOUS
  Filled 2015-09-23 (×5): qty 250

## 2015-09-23 MED ORDER — SODIUM CHLORIDE 0.9 % IV BOLUS (SEPSIS)
1000.0000 mL | Freq: Once | INTRAVENOUS | Status: AC
  Administered 2015-09-23: 1000 mL via INTRAVENOUS

## 2015-09-23 MED ORDER — LORAZEPAM 2 MG/ML IJ SOLN
INTRAMUSCULAR | Status: AC
  Administered 2015-09-23: 0.25 mg
  Filled 2015-09-23: qty 1

## 2015-09-23 NOTE — Progress Notes (Signed)
eLink Physician-Brief Progress Note Patient Name: NAYOMIE ELTRINGHAM DOB: July 07, 1940 MRN: [id number]   Date of Service  09/23/2015  HPI/Events of Note  RN calling for runs of VT  eICU Interventions  Has baseline LBBB- Runs of AF look like VT Chk K & Mg      Intervention Category Intermediate Interventions: Arrhythmia - evaluation and management  Mustafa Potts V. 09/23/2015, 8:50 PM

## 2015-09-23 NOTE — Progress Notes (Signed)
Interval History:                                                                                                                      Jill White is an 75 y.o. 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 Her Keppra dose was increased yesterday to 1500 mg twice a day, and added Depakote 125 g every 6 hours. Repeat EEG today showed complete resolution of the abnormal discharges. Background slowing suggestive from mild to moderate encephalopathy is noted. Patient is drowsy but arousable to verbal commands. No further clinical events suggestive of seizures. Family at bedside.    Past Medical History: Past Medical History  Diagnosis Date  . HIP PAIN   . HYPERTENSION   . Restless leg syndrome   . COPD (chronic obstructive pulmonary disease) (HCC)     a. Multiple prior admissions for acute hypoxic respiratory failure in setting of COPD exacerbations +/- pneumonia.  . Peptic ulcer disease     a. 2013: gastric antrum.  Marland Kitchen Hyponatremia   . Tobacco abuse   . Hypomagnesemia   . LBBB (left bundle branch block)     a. Intermittent, likely rate related.  . Pulmonary HTN ([place])     a. Elevated PA pressure by prior echoes.  . Mitral regurgitation     a. Mild MR by echo 2014, not mentioned on 11/2013 (severely calcified annulus).  Marland Kitchen Respiratory failure ([place])     a. Adm 11/2013 with severe hypercarbic and hypoxemic respiratory failure requiring intubation.   . Abnormal echocardiogram     a. 11/2013: decreased EF 45-50%, moderate AS- > subsequent cath 12/02/13 with mildly calcified cors without obstruction, mild AS, EF 60%, mild pulm HTN.  Marland Kitchen Aortic stenosis     a. 11/2013: mod by echo, then mild by cath.  . Pulmonary HTN ([place])     a. 11/2013: mild by cath.  . Wide-complex tachycardia ([place])     a. Felt likely atrial tach with rate-related LBBB.  Marland Kitchen Chronic diastolic CHF (congestive heart failure) ([place])   .  Diverticulitis of colon (without mention of hemorrhage) 08/09/2013  . Septic shock ([place]) 12/11/2013  . Acute on chronic combined systolic and diastolic congestive heart failure, NYHA class 4 ([place]) 11/27/2013    Past Surgical History  Procedure Laterality Date  . Abdominal hysterectomy      vaginal hysterectomy  . Dilation and curettage of uterus    . Tonsillectomy    . Esophagogastroduodenoscopy  07/09/2011    Procedure: ESOPHAGOGASTRODUODENOSCOPY (EGD);  Surgeon: Missy Sabins, MD;  Location: [facility];  Service: Endoscopy;  Laterality: N/A;  patient in room 1532  . Cataract extraction w/ intraocular lens  implant, bilateral Bilateral 03/2013    Patient claims it was within the month.   . Left and right heart catheterization with coronary angiogram N/A 12/02/2013    Procedure: LEFT AND RIGHT HEART CATHETERIZATION WITH CORONARY ANGIOGRAM;  Surgeon:  Blane Ohara, MD;  Location: [facility] CATH LAB;  Service: Cardiovascular;  Laterality: N/A;  . Laparoscopic sigmoid colectomy  07-03-14    per Dr. Malachi Carl in [place]     Family History: Family History  Problem Relation Age of Onset  . Heart disease Sister   . Heart disease Brother   . Dementia Mother   . Anemia Father     Social History:   reports that she has been smoking Cigarettes.  She has smoked for the past 50 years. She has never used smokeless tobacco. She reports that she does not drink alcohol or use illicit drugs.  Allergies:  Allergies  Allergen Reactions  . Tetanus Toxoid Anaphylaxis  . Penicillins Hives    Has patient had a PCN reaction causing immediate rash, facial/tongue/throat swelling, SOB or lightheadedness with hypotension: unknown  Has patient had a PCN reaction causing severe rash involving mucus membranes or skin necrosis: No  Has patient had a PCN reaction that required hospitalization: No  Has patient had a PCN reaction occurring within the last 10 years: No  If all of the above answers are "NO",  then may proceed with Cephalosporin use.   . Codeine Nausea And Vomiting and Other (See Comments)    Itching and faint   . Erythromycin Nausea And Vomiting  . Gabapentin     REACTION: severe edema, nausea, dizziness, disorientation  . Levaquin [Levofloxacin In D5w] Other (See Comments)    Joint pain; (states she can take it IV- not by mouth)  . Meloxicam Itching  . Methylprednisolone Hives  . Prednisone Other (See Comments)    GI bleed     Medications:                                                                                                                         Current facility-administered medications:  .  0.9 %  sodium chloride infusion, , Intravenous, Continuous, Javier Glazier, MD, Last Rate: 10 mL/hr at 09/23/15 1100 .  acetaminophen (TYLENOL) tablet 650 mg, 650 mg, Oral, Q6H PRN, 650 mg at 09/20/15 1544 **OR** acetaminophen (TYLENOL) suppository 650 mg, 650 mg, Rectal, Q6H PRN, Rise Patience, MD .  acetaZOLAMIDE (DIAMOX) injection 250 mg, 250 mg, Intravenous, Q6H, Rush Farmer, MD, 250 mg at 09/23/15 1754 .  albuterol (PROVENTIL) (2.5 MG/3ML) 0.083% nebulizer solution 2.5 mg, 2.5 mg, Nebulization, Q2H PRN, Rise Patience, MD, 2.5 mg at 09/21/15 0304 .  antiseptic oral rinse (CPC / CETYLPYRIDINIUM CHLORIDE 0.05%) solution 7 mL, 7 mL, Mouth Rinse, q12n4p, Orson Eva, MD, 7 mL at 09/23/15 1600 .  budesonide (PULMICORT) nebulizer solution 0.5 mg, 0.5 mg, Nebulization, BID, Marijean Heath, NP, 0.5 mg at 09/23/15 1935 .  chlorhexidine (PERIDEX) 0.12 % solution 15 mL, 15 mL, Mouth Rinse, BID, Orson Eva, MD, 15 mL at 09/23/15 0950 .  diclofenac sodium (VOLTAREN) 1 % transdermal gel 2 g, 2 g, Topical, QID, Orson Eva, MD, 2 g at 09/23/15  1805 .  diltiazem (CARDIZEM) 100 mg in dextrose 5 % 100 mL (1 mg/mL) infusion, 5-15 mg/hr, Intravenous, Titrated, Javier Glazier, MD, Stopped at 09/22/15 2230 .  enoxaparin (LOVENOX) injection 40 mg, 40 mg, Subcutaneous,  Q24H, Rise Patience, MD, 40 mg at 09/22/15 2253 .  hydrALAZINE (APRESOLINE) injection 10 mg, 10 mg, Intravenous, Q4H PRN, [name], MD, 10 mg at 09/22/15 0507 .  ipratropium-albuterol (DUONEB) 0.5-2.5 (3) MG/3ML nebulizer solution 3 mL, 3 mL, Nebulization, Q6H WA, Orson Eva, MD, 3 mL at 09/23/15 1934 .  levETIRAcetam (KEPPRA) 1,500 mg in sodium chloride 0.9 % 100 mL IVPB, 1,500 mg, Intravenous, Q12H, Faren Florence Fuller Mandril, MD, 1,500 mg at 09/23/15 1104 .  Living Better with Heart Failure Book, , Does not apply, Once, Shanon Brow Tat, MD .  methylPREDNISolone sodium succinate (SOLU-MEDROL) 40 mg/mL injection 40 mg, 40 mg, Intravenous, Q24H, Javier Glazier, MD, 40 mg at 09/23/15 0636 .  ondansetron (ZOFRAN) tablet 4 mg, 4 mg, Oral, Q6H PRN **OR** ondansetron (ZOFRAN) injection 4 mg, 4 mg, Intravenous, Q6H PRN, Rise Patience, MD, 4 mg at 09/23/15 1843 .  pantoprazole (PROTONIX) injection 40 mg, 40 mg, Intravenous, Q24H, Orson Eva, MD, 40 mg at 09/23/15 0711 .  prochlorperazine (COMPAZINE) injection 5 mg, 5 mg, Intravenous, Q6H PRN, Ritta Slot, NP, 5 mg at 09/18/15 2349 .  sodium chloride flush (NS) 0.9 % injection 3 mL, 3 mL, Intravenous, Q12H, Rise Patience, MD, 3 mL at 09/23/15 1015 .  valproate (DEPACON) 125 mg in dextrose 5 % 50 mL IVPB, 125 mg, Intravenous, 4 times per day, Divante Kotch Fuller Mandril, MD, 125 mg at 09/23/15 1805   Neurologic Examination:                                                                                                     Today's Vitals   09/23/15 1700 09/23/15 1800 09/23/15 1900 09/23/15 1934  BP: 117/51 126/59 128/73   Pulse: 68 73 65 80  Temp:      TempSrc:      Resp: 19 25 23 20   Height:      Weight:      SpO2: 99% 94% 96% 95%  PainSc:       Drowsy, arousable to verbal commands. Follows commands without difficulty, antegrade distress in all 4 extremities without drift. No nystagmus or gaze deviation or abnormal  involuntary movements noted.    Lab Results: Basic Metabolic Panel:  Recent Labs Lab 09/18/15 0316 09/19/15 0521 09/20/15 0507 09/21/15 0445 09/22/15 0310  NA 132* 137 139 140 141  K 3.9 3.6 3.2* 3.6 3.5  CL 83* 84* 81* 77* 71*  CO2 39* 45* >50* >50* >50*  GLUCOSE 140* 115* 126* 110* 144*  BUN 15 13 15 16  21*  CREATININE 0.43* 0.41* 0.40* 0.51 0.53  CALCIUM 8.8* 9.1 9.1 9.0 8.8*  MG  --   --   --  1.7  --     Liver Function Tests: No results for input(s): AST, ALT, ALKPHOS, BILITOT, PROT, ALBUMIN in the last  168 hours. No results for input(s): LIPASE, AMYLASE in the last 168 hours. No results for input(s): AMMONIA in the last 168 hours.  CBC:  Recent Labs Lab 09/17/15 0314 09/18/15 0316 09/19/15 0521 09/21/15 0445 09/22/15 0310  WBC 8.5 10.9* 10.8* 8.1 6.2  HGB 11.7* 11.9* 13.0 13.6 13.2  HCT 35.9* 35.7* 40.2 45.2 44.1  MCV 96.2 93.9 96.6 100.9* 102.1*  PLT 241 258 279 256 242    Cardiac Enzymes: No results for input(s): CKTOTAL, CKMB, CKMBINDEX, TROPONINI in the last 168 hours.  Lipid Panel: No results for input(s): CHOL, TRIG, HDL, CHOLHDL, VLDL, LDLCALC in the last 168 hours.  CBG:  Recent Labs Lab 09/23/15 0112  GLUCAP 104*    Microbiology: Results for orders placed or performed during the hospital encounter of 09/13/15  MRSA PCR Screening     Status: None   Collection Time: 09/16/15  8:13 PM  Result Value Ref Range Status   MRSA by PCR NEGATIVE NEGATIVE Final    Comment:        The GeneXpert MRSA Assay (FDA approved for NASAL specimens only), is one component of a comprehensive MRSA colonization surveillance program. It is not intended to diagnose MRSA infection nor to guide or monitor treatment for MRSA infections.     Imaging: Mr Herby Abraham Contrast  09/23/2015  CLINICAL DATA:  Seizure. Patient originally admitted for aspirate distress. Had seizure in ICU. EXAM: MRI HEAD WITHOUT CONTRAST TECHNIQUE: Multiplanar, multiecho pulse  sequences of the brain and surrounding structures were obtained without intravenous contrast. COMPARISON:  None. FINDINGS: No acute infarct, hemorrhage, or mass lesion is present. The ventricles are of normal size. No significant extraaxial fluid collection is present. Mild periventricular and subcortical white matter changes are present bilaterally. Matter changes extend into the brainstem. The cerebellum is unremarkable. The auditory canals are within normal limits bilaterally. Flow is present the major intracranial arteries. Bilateral lens replacements are noted. A fluid level is present in the left maxillary sinus. The remaining paranasal sinuses are clear. There is fluid in the right mastoid air cells. No obstructing nasopharyngeal lesion is evident. Dedicated imaging the temporal lobes demonstrate symmetric size and signal of the hippocampal structures. No focal or acute abnormality is present to correlate with seizure activity. Skullbase is within normal limits. Midline sagittal images are. Degenerative changes are noted in the upper cervical spine. IMPRESSION: 1. No acute or focal abnormality to account for seizures. 2. Mild white matter changes likely reflect the sequela of chronic microvascular ischemia. Electronically Signed   By: San Morelle M.D.   On: 09/23/2015 07:26   Dg Chest Port 1 View  09/22/2015  CLINICAL DATA:  Respiratory failure. EXAM: PORTABLE CHEST 1 VIEW COMPARISON:  09/21/2015. FINDINGS: Mediastinum hilar structures normal. Stable cardiomegaly. Interim near complete clearing of pulmonary interstitial edema. No focal infiltrate. Small left pleural effusion. No pneumothorax. IMPRESSION: Interim near complete clearing of pulmonary interstitial edema. Small left pleural effusion. Electronically Signed   By: Marcello Moores  Register   On: 09/22/2015 07:11   Dg Chest Port 1 View  09/21/2015  CLINICAL DATA:  Acute on chronic respiratory failure with hypoxia and hypercapnia. COPD. EXAM:  PORTABLE CHEST 1 VIEW COMPARISON:  Radiographs dated 09/20/2015, 09/16/2015 and 06/23/2015 and CT scan dated 09/13/2015 FINDINGS: There is persistent accentuation of the pulmonary vascularity and interstitial markings consistent with pulmonary edema superimposed on COPD. There is tortuosity and calcification of the thoracic aorta. No appreciable effusions. The osteopenia. No acute bone abnormality. IMPRESSION: Persistent interstitial edema  superimposed on COPD. Electronically Signed   By: Lorriane Shire M.D.   On: 09/21/2015 08:15   Dg Chest Port 1 View  09/20/2015  CLINICAL DATA:  Shortness of breath and COPD. EXAM: PORTABLE CHEST 1 VIEW COMPARISON:  09/16/2015 FINDINGS: Cardiomegaly and diffuse bilateral interstitial opacities again noted. Left lower lung opacities have decreased. There is no evidence of pneumothorax. No other changes identified. IMPRESSION: Decreased left lower lung opacities, otherwise unchanged appearance chest. Electronically Signed   By: Margarette Canada M.D.   On: 09/20/2015 07:34    Assessment and plan:   Jill White is an 75 y.o. female patient with seizure yesterday as described in my consultation note. The current seizure medication regimen of Keppra 1500 mg twice daily and Depacon 125 mg every 6 hours, a repeat EEG should complete resolution of abnormal discharges. Clinically she has not had any further seizures. No further change in seizure medication doses recommended.  We'll follow-up

## 2015-09-23 NOTE — Progress Notes (Signed)
Made Dr. Nelda Marseille aware of patient bradying to the 20s. No further orders received. Will continue to monitor patient.

## 2015-09-23 NOTE — Progress Notes (Signed)
eLink Physician-Brief Progress Note Patient Name: Jill White DOB: 01-11-1941 MRN: [id number]   Date of Service  09/23/2015  HPI/Events of Note  Her PH is slight alkalotic , co2 is about at baseline , likely baseline about 100 Her ph is normal and is NOT a contributor to her neurostatus that waxes and wanes If this was acute co2 her ph would be 6.92 and its not She is in NO distress She is HIGH risk aspiration with bipap  eICU Interventions  No bipap For MRI     Intervention Category Major Interventions: Change in mental status - evaluation and management  Brynnly Bonet J. 09/23/2015, 1:45 AM

## 2015-09-23 NOTE — Evaluation (Signed)
Physical Therapy Evaluation Patient Details Name: Jill White MRN: [id number] DOB: 13-Aug-1940 Today's Date: 09/23/2015   History of Present Illness  pt with acute on chronic respiratory failure and seizure activity.  pt with hx of COPD, Aortic Stenosis, CHF, HTN, RLS, Pulmonary HTN, Mitral Regurgitation, and Bil Cataract Extraction.    Clinical Impression  Pt lethargic throughout session, but with spontaneous eye opening when positioned sitting EOB.  Pt did follow occasional directions to show her thumb and wiggle toes, but does respond better when given verbal and tactile cues.  At this point feel pt would need SNF level of care at D/C to decrease burden of care.  Will continue to follow.      Follow Up Recommendations SNF    Equipment Recommendations  None recommended by PT    Recommendations for Other Services       Precautions / Restrictions Precautions Precautions: Fall Restrictions Weight Bearing Restrictions: No      Mobility  Bed Mobility Overal bed mobility: Needs Assistance;+2 for physical assistance Bed Mobility: Supine to Sit;Sit to Supine     Supine to sit: Max assist;HOB elevated Sit to supine: Max assist;+2 for physical assistance   General bed mobility comments: Once PT initiated movement, pt did seem to help with bringing self towards EOB, but decreased A with returning to supine.    Transfers                    Ambulation/Gait                Stairs            Wheelchair Mobility    Modified Rankin (Stroke Patients Only) Modified Rankin (Stroke Patients Only) Pre-Morbid Rankin Score: No significant disability Modified Rankin: Severe disability     Balance Overall balance assessment: Needs assistance Sitting-balance support: Bilateral upper extremity supported;Feet supported Sitting balance-Leahy Scale: Poor Sitting balance - Comments: pt remains with flexed posture and does actively participate with maintaining sitting  balance.                                       Pertinent Vitals/Pain Pain Assessment: Faces Faces Pain Scale: No hurt    Home Living Family/patient expects to be discharged to:: Skilled nursing facility Living Arrangements: Alone                    Prior Function Level of Independence: Independent               Hand Dominance        Extremity/Trunk Assessment   Upper Extremity Assessment: Defer to OT evaluation           Lower Extremity Assessment: RLE deficits/detail;LLE deficits/detail RLE Deficits / Details: Minimal Active movement noted, but pain sensation appears to be intact.   LLE Deficits / Details: Minimal active movement, but pain sensation appears to be intact.  Cervical / Trunk Assessment: Kyphotic  Communication   Communication:  (No verbalizations)  Cognition Arousal/Alertness: Lethargic Behavior During Therapy: Flat affect Overall Cognitive Status: Difficult to assess                      General Comments      Exercises        Assessment/Plan    PT Assessment Patient needs continued PT services  PT Diagnosis Difficulty walking;Generalized weakness;Altered mental status  PT Problem List Decreased strength;Decreased activity tolerance;Decreased balance;Decreased mobility;Decreased coordination;Decreased cognition;Decreased knowledge of use of DME;Decreased safety awareness  PT Treatment Interventions DME instruction;Gait training;Functional mobility training;Therapeutic activities;Therapeutic exercise;Balance training;Neuromuscular re-education;Cognitive remediation;Patient/family education   PT Goals (Current goals can be found in the Care Plan section) Acute Rehab PT Goals Patient Stated Goal: Per family to get back to normal. PT Goal Formulation: With family Time For Goal Achievement: 10/07/15 Potential to Achieve Goals: Fair    Frequency Min 2X/week   Barriers to discharge        Co-evaluation                End of Session Equipment Utilized During Treatment: Oxygen Activity Tolerance: Patient limited by lethargy Patient left: in bed;with call bell/phone within reach;with nursing/sitter in room;with family/visitor present;with bed alarm set Nurse Communication: Mobility status         Time: 1047-1100 PT Time Calculation (min) (ACUTE ONLY): 13 min   Charges:   PT Evaluation $PT Eval Moderate Complexity: 1 Procedure     PT G CodesCatarina Hartshorn, [place] [phone] 09/23/2015, 1:58 PM

## 2015-09-23 NOTE — Progress Notes (Signed)
EEG completed; results pending.    

## 2015-09-23 NOTE — Progress Notes (Signed)
PULMONARY / CRITICAL CARE MEDICINE   Name: Jill White MRN: [id number] DOB: May 28, 1941    ADMISSION DATE:  09/13/2015 CONSULTATION DATE:  3/29  REFERRING MD: TAT  CHIEF COMPLAINT:    HISTORY OF PRESENT ILLNESS:   75yo female with severe COPD, mod/severe AS initially admitted 3/26 with acute on chronic respiratory from AECOPD and decompensated heart failure.  She was treated with steroids, diuresis, BD.  Had worsening respiratory status on 3/29 requiring tx SDU and bipap.  She improved with bipap and aggressive rx AECOPD.  She tx back to floor on 4/1.  On 4/3 pt had worsening hypoxia, AMS and was tx back to ICU on bipap and PCCM re-consulted.    SUBJECTIVE: Patient did have agitation overnight requiring IV Haldol. More awake this morning. Patient denies any pain or difficulty breathing on BiPAP with a head nod. Granddaughter at bedside.  VITAL SIGNS: BP 96/46 mmHg  Pulse 74  Temp(Src) 98.1 F (36.7 C) (Axillary)  Resp 21  Ht 5\' 3"  (1.6 m)  Wt 48.6 kg (107 lb 2.3 oz)  BMI 18.98 kg/m2  SpO2 90%  HEMODYNAMICS:    VENTILATOR SETTINGS: Vent Mode:  [-] Other (Comment) FiO2 (%):  [50 %] 50 % Set Rate:  [12 bmp] 12 bmp PEEP:  [6 cmH20-10 cmH20] 6 cmH20  INTAKE / OUTPUT: I/O last 3 completed shifts: In: 971.8 [I.V.:244.3; IV Piggyback:727.5] Out: 250 [Urine:250]   PHYSICAL EXAMINATION: General:  Awake. No acute distress. Granddaughter at bedside.  Integument:  Warm & dry. No rash on exposed skin.  HEENT:  No scleral injection or icterus. BiPAP mask in place. Cardiovascular:  Irregular rhythm and intermittently tachycardic. No edema. No appreciable JVD.  Pulmonary:  Good aeration & clear to auscultation bilaterally. Normal work of breathing on BiPAP. Abdomen: Soft. Normal bowel sounds. Nondistended.  Neurological: Following commands. Nodding to questions. Grossly nonfocal moving all 4 extremities.  LABS: ABG    Component Value Date/Time   PHART 7.439 09/23/2015 0006   PCO2ART 98.3* 09/23/2015 0006   PO2ART 74.6* 09/23/2015 0006   HCO3 65.5* 09/23/2015 0006   TCO2 68.5 09/23/2015 0006   O2SAT 94.5 09/23/2015 0006   BMET  Recent Labs Lab 09/20/15 0507 09/21/15 0445 09/22/15 0310  NA 139 140 141  K 3.2* 3.6 3.5  CL 81* 77* 71*  CO2 >50* >50* >50*  BUN 15 16 21*  CREATININE 0.40* 0.51 0.53  GLUCOSE 126* 110* 144*   Electrolytes  Recent Labs Lab 09/20/15 0507 09/21/15 0445 09/22/15 0310  CALCIUM 9.1 9.0 8.8*  MG  --  1.7  --    CBC  Recent Labs Lab 09/19/15 0521 09/21/15 0445 09/22/15 0310  WBC 10.8* 8.1 6.2  HGB 13.0 13.6 13.2  HCT 40.2 45.2 44.1  PLT 279 256 242   Coag's No results for input(s): APTT, INR in the last 168 hours.  Sepsis Markers  Recent Labs Lab 09/18/15 0316 09/20/15 0507  PROCALCITON <0.10 <0.10   ABG  Recent Labs Lab 09/21/15 1824 09/22/15 2015 09/23/15 0006  PHART 7.392 7.502* 7.439  PCO2ART 99.8* 79.9* 98.3*  PO2ART 75.8* 90.5 74.6*   Liver Enzymes No results for input(s): AST, ALT, ALKPHOS, BILITOT, ALBUMIN in the last 168 hours.  Cardiac Enzymes No results for input(s): TROPONINI, PROBNP in the last 168 hours.  Glucose  Recent Labs Lab 09/23/15 0112  GLUCAP 104*   Imaging Mr Brain Wo Contrast  09/23/2015  CLINICAL DATA:  Seizure. Patient originally admitted for aspirate distress. Had seizure  in ICU. EXAM: MRI HEAD WITHOUT CONTRAST TECHNIQUE: Multiplanar, multiecho pulse sequences of the brain and surrounding structures were obtained without intravenous contrast. COMPARISON:  None. FINDINGS: No acute infarct, hemorrhage, or mass lesion is present. The ventricles are of normal size. No significant extraaxial fluid collection is present. Mild periventricular and subcortical white matter changes are present bilaterally. Matter changes extend into the brainstem. The cerebellum is unremarkable. The auditory canals are within normal limits bilaterally. Flow is present the major intracranial  arteries. Bilateral lens replacements are noted. A fluid level is present in the left maxillary sinus. The remaining paranasal sinuses are clear. There is fluid in the right mastoid air cells. No obstructing nasopharyngeal lesion is evident. Dedicated imaging the temporal lobes demonstrate symmetric size and signal of the hippocampal structures. No focal or acute abnormality is present to correlate with seizure activity. Skullbase is within normal limits. Midline sagittal images are. Degenerative changes are noted in the upper cervical spine. IMPRESSION: 1. No acute or focal abnormality to account for seizures. 2. Mild white matter changes likely reflect the sequela of chronic microvascular ischemia. Electronically Signed   By: San Morelle M.D.   On: 09/23/2015 07:26   STUDIES:  3/26 CXR >> Chronic changes, no acute findings  3/26 CTA Chest >> No acute findings 3/27 MR T-Spine >> Acute compression fracture involving the T8 vertebral body, small layering bilateral pleural effusions   3/27 Echo >> EF 45-50%, moderate aortic stenosis  3/29 CXR >> Left basilar airspace opacity, concerning for PNA, borderline cardiomegaly 4/04 CXR >> Significant improvement in bilateral interstitial markings/pulmonary edema. Blunting left costophrenic angle suggestive of small pleural effusion.  MICROBIOLOGY: MRSA PCR 3/29:  Negative   ANTIBIOTICS: Levaquin 3/31>>> Cefepime 3/29 - 3/31 Vancomycin 3/29 - 3/31  SIGNIFICANT EVENTS: 3/26 >> To ED with Increase SOB, upper back pain, swelling to lower extremities   3/29 >> Rapid response called, hypoxic in the 70s, HR 130-140s, lethargic  4/03 >> Rapid response called w/ lethargy, hypoxic in 50s, HR 120s & BP 94/49  ASSESSMENT / PLAN:  Patient with acute on chronic hypoxic & hypercarbic respiratory failure. Mental status significantly improved today. Patient is having more of an increased heart rate with atrial fibrillation having missed her evening dose of  diltiazem. Respiratory status has significantly improved with diuresis which is evidenced based on chest x-ray today. I believe switching to oral Lasix is reasonable. Traveling the patient off of BiPAP at this time with reinitiation of oral medications.  1. 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 Continuing scheduled budesonide bid & Duoneb q6hr. Changing Solu-Medrol to 40mg  IV daily starting tomorrow.  Restart BiPAP given poor mentals status. 2. Acute on chronic hypoxic respiratory failure: Hold further lasix given contraction alkalosis. 3. Acute encephalopathy: Likely secondary to hypercarbia driven by contraction alkalosis.  Will give acetazolamide and give some chloride back, hope is that will result in metabolic acidosis (relative) that will stimulate her respiration to decrease CO2. 4. Severe Aortic Stenosis:  Cautious diuresis. 5. Atrial Fibrillation w/ RVR:  Monitor on telemetry. Hold cardizem, if RVR then will start amiodarone. 6. Hypotension:  Hold all anti-HTN, NS 500 ml/hr. 7. Acute T8 Compression Fracture:  Holding pain medication given altered mental status. 8. Prophylaxis:  Protonix IV qday & Lovenox [place] daily. 9. GI: NPO.  Spoke with grand daughter regarding code status.  She insists on full code for now to include intubation.  She refuses to answer questions like trach/peg...etc. And insists that she will  address that when the time comes and that it is inappropriate to discuss that right now because it is needed.  The patient is critically ill with multiple organ systems failure and requires high complexity decision making for assessment and support, frequent evaluation and titration of therapies, application of advanced monitoring technologies and extensive interpretation of multiple databases.   Critical Care Time devoted to patient care services described in this note is  35  Minutes. This time reflects time of care of this signee Dr Jennet Maduro. This critical care time does not reflect procedure time, or teaching time or supervisory time of PA/NP/Med student/Med Resident etc but could involve care discussion time.  Rush Farmer, M.D. [facility] Pulmonary/Critical Care Medicine. Pager: [phone]. After hours pager: [phone].  9:47 AM 09/23/2015

## 2015-09-23 NOTE — Progress Notes (Signed)
Dr. Cristobal Goldmann with neurology notified that patient's pupils are now different than my prior assessment. R pupil 4 mm and hippas, L pupil 2 mm and hippas. Also informed him of patient's hypotension, bradycardia with pauses, and increasing drowsiness. 1 liter normal saline bolus ordered and initiated. Order also placed for STAT ABG. Will continue to monitor patient.

## 2015-09-23 NOTE — Care Management Important Message (Signed)
Important Message  Patient Details  Name: Jill White MRN: [id number] Date of Birth: 08/17/1940   Medicare Important Message Given:  Other (see comment)    [name], [name] 09/23/2015, 11:38 AM

## 2015-09-24 DIAGNOSIS — J9600 Acute respiratory failure, unspecified whether with hypoxia or hypercapnia: Secondary | ICD-10-CM

## 2015-09-24 LAB — CBC
HEMATOCRIT: 41.9 % (ref 36.0–46.0)
HEMOGLOBIN: 12.7 g/dL (ref 12.0–15.0)
MCH: 31.1 pg (ref 26.0–34.0)
MCHC: 30.3 g/dL (ref 30.0–36.0)
MCV: 102.7 fL — ABNORMAL HIGH (ref 78.0–100.0)
Platelets: 196 10*3/uL (ref 150–400)
RBC: 4.08 MIL/uL (ref 3.87–5.11)
RDW: 14.3 % (ref 11.5–15.5)
WBC: 10.7 10*3/uL — ABNORMAL HIGH (ref 4.0–10.5)

## 2015-09-24 LAB — MAGNESIUM: Magnesium: 1.9 mg/dL (ref 1.7–2.4)

## 2015-09-24 LAB — POCT I-STAT 3, ART BLOOD GAS (G3+)
Acid-Base Excess: 22 mmol/L — ABNORMAL HIGH (ref 0.0–2.0)
Bicarbonate: 52.6 mEq/L — ABNORMAL HIGH (ref 20.0–24.0)
O2 Saturation: 93 %
PCO2 ART: 91 mmHg — AB (ref 35.0–45.0)
PO2 ART: 74 mmHg — AB (ref 80.0–100.0)
Patient temperature: 98.6
TCO2: >50 mmol/L (ref 0–100)
pH, Arterial: 7.37 (ref 7.350–7.450)

## 2015-09-24 LAB — BASIC METABOLIC PANEL
Anion gap: 10 (ref 5–15)
BUN: 21 mg/dL — AB (ref 6–20)
CHLORIDE: 86 mmol/L — AB (ref 101–111)
CO2: 45 mmol/L — ABNORMAL HIGH (ref 22–32)
Calcium: 8.7 mg/dL — ABNORMAL LOW (ref 8.9–10.3)
Creatinine, Ser: 0.43 mg/dL — ABNORMAL LOW (ref 0.44–1.00)
GFR calc Af Amer: >60 mL/min (ref >60)
GFR calc non Af Amer: >60 mL/min (ref >60)
GLUCOSE: 98 mg/dL (ref 65–99)
Potassium: 2.8 mmol/L — ABNORMAL LOW (ref 3.5–5.1)
Sodium: 141 mmol/L (ref 135–145)

## 2015-09-24 LAB — PHOSPHORUS: Phosphorus: 2.4 mg/dL — ABNORMAL LOW (ref 2.5–4.6)

## 2015-09-24 MED ORDER — POTASSIUM CHLORIDE 10 MEQ/100ML IV SOLN
10.0000 meq | INTRAVENOUS | Status: AC
  Administered 2015-09-24 (×4): 10 meq via INTRAVENOUS
  Filled 2015-09-24 (×4): qty 100

## 2015-09-24 MED ORDER — LEVETIRACETAM 750 MG PO TABS
1500.0000 mg | ORAL_TABLET | Freq: Two times a day (BID) | ORAL | Status: DC
  Administered 2015-09-24 – 2015-09-28 (×9): 1500 mg via ORAL
  Filled 2015-09-24 (×2): qty 2
  Filled 2015-09-24 (×2): qty 3
  Filled 2015-09-24: qty 2
  Filled 2015-09-24: qty 3
  Filled 2015-09-24 (×3): qty 2
  Filled 2015-09-24: qty 3

## 2015-09-24 MED ORDER — ENSURE ENLIVE PO LIQD
237.0000 mL | Freq: Two times a day (BID) | ORAL | Status: DC
  Administered 2015-09-24 – 2015-09-27 (×5): 237 mL via ORAL

## 2015-09-24 MED ORDER — METHYLPREDNISOLONE SODIUM SUCC 40 MG IJ SOLR
20.0000 mg | INTRAMUSCULAR | Status: DC
  Administered 2015-09-24 – 2015-09-27 (×3): 20 mg via INTRAVENOUS
  Filled 2015-09-24 (×4): qty 1

## 2015-09-24 MED ORDER — DIVALPROEX SODIUM 250 MG PO DR TAB
250.0000 mg | DELAYED_RELEASE_TABLET | Freq: Two times a day (BID) | ORAL | Status: DC
  Administered 2015-09-24 – 2015-09-28 (×9): 250 mg via ORAL
  Filled 2015-09-24 (×10): qty 1

## 2015-09-24 NOTE — Progress Notes (Signed)
eLink Physician-Brief Progress Note Patient Name: Jill White DOB: [birth year] MRN: [id number]   Date of Service  09/24/2015  HPI/Events of Note  Pt with agitation, abg 7.37/91  eICU Interventions  Bipap      Intervention Category Major Interventions: Acid-Base disturbance - evaluation and management  [place] 09/24/2015, 3:48 AM

## 2015-09-24 NOTE — Progress Notes (Addendum)
Spoke with patient's granddaughter-[name].  She is requesting a neurology second opinion.  Dr. Silverio Decamp with neurology paged and notified.  Sanda Linger   Spoke with granddaughter Janett Billow again. States Dr. Silverio Decamp did give her a call and update her concerning patient's condition.  Sanda Linger

## 2015-09-24 NOTE — Progress Notes (Signed)
Pt intermittently agitated.  Pulled off 02 desated to 70%. Pt on 4L sating 98%. Up to [facility] ([facility]), moderate assist. Resting comfortably with family at bedside. kepra and depakote trasitioned to PO meds. Pt still receiving solumedrol 20 mg every 24. Received for runs of K. Per order K will be checked in am. Daughter at bedside.

## 2015-09-24 NOTE — Progress Notes (Signed)
RT obtained ABG. MD requested patient be placed back on BIPAP due to CO2 91. RT has attempted to place patient on BIPAP. Patient continues to refuse and taking mask off. Patient is very confused and gets agitated. RN is aware of situation. RT place patient back on 4L [place].

## 2015-09-24 NOTE — Progress Notes (Signed)
Patient cooperative at times during RN assessment.  Patient did squeeze RN's fingers when RN asked.  RN asked patient if she could lift her left leg and patient stated no.  RN asked patient if she could lift her right leg and patient stated no.  While RN in room did previously witness patient moving both legs around in bed, picking them off the bed at times during movement.

## 2015-09-24 NOTE — Progress Notes (Signed)
PULMONARY / CRITICAL CARE MEDICINE   Name: Jill White MRN: [id number] DOB: [birth year]    ADMISSION DATE:  09/13/2015 CONSULTATION DATE:  3/29  REFERRING MD: TAT  CHIEF COMPLAINT:    HISTORY OF PRESENT ILLNESS:   75yo female with severe COPD, mod/severe AS initially admitted 3/26 with acute on chronic respiratory from AECOPD and decompensated heart failure.  She was treated with steroids, diuresis, BD.  Had worsening respiratory status on 3/29 requiring tx SDU and bipap.  She improved with bipap and aggressive rx AECOPD.  She tx back to floor on 4/1.  On 4/3 pt had worsening hypoxia, AMS and was tx back to ICU on bipap and PCCM re-consulted.   SUBJECTIVE: no distress, no seizures   VITAL SIGNS: BP 91/58 mmHg  Pulse 95  Temp(Src) 98.5 F (36.9 C) (Oral)  Resp 21  Ht 5\' 3"  (1.6 m)  Wt 47.8 kg (105 lb 6.1 oz)  BMI 18.67 kg/m2  SpO2 95%  HEMODYNAMICS:    VENTILATOR SETTINGS: Vent Mode:  [-]  FiO2 (%):  [40 %-55 %] 55 %  INTAKE / OUTPUT: I/O last 3 completed shifts: In: 1173.9 [I.V.:421.4; IV Piggyback:752.5] Out: [id number] [Urine:655]   PHYSICAL EXAMINATION: General:  No acute distress. Granddaughter at bedside.  Integument:  wnl HEENT:  jvd wnl Cardiovascular:  s1 s2 RRR, LBBB Pulmonary:  reduced Abdomen: Soft. Normal bowel sounds. Nondistended.  Neurological: Following commands. O x 2, angry  LABS: ABG    Component Value Date/Time   PHART 7.370 09/24/2015 0306   PCO2ART 91.0* 09/24/2015 0306   PO2ART 74.0* 09/24/2015 0306   HCO3 52.6* 09/24/2015 0306   TCO2 >50 09/24/2015 0306   O2SAT 93.0 09/24/2015 0306   BMET  Recent Labs Lab 09/22/15 0310 09/23/15 2134 09/24/15 0312  NA 141 142 141  K 3.5 3.5 2.8*  CL 71* 85* 86*  CO2 >50* 45* 45*  BUN 21* 21* 21*  CREATININE 0.53 0.44 0.43*  GLUCOSE 144* 97 98   Electrolytes  Recent Labs Lab 09/21/15 0445 09/22/15 0310 09/23/15 2134 09/24/15 0312  CALCIUM 9.0 8.8* 8.8* 8.7*  MG 1.7  --  1.9 1.9   PHOS  --   --   --  2.4*   CBC  Recent Labs Lab 09/21/15 0445 09/22/15 0310 09/24/15 0312  WBC 8.1 6.2 10.7*  HGB 13.6 13.2 12.7  HCT 45.2 44.1 41.9  PLT 256 242 196   Coag's No results for input(s): APTT, INR in the last 168 hours.  Sepsis Markers  Recent Labs Lab 09/18/15 0316 09/20/15 0507  PROCALCITON <0.10 <0.10   ABG  Recent Labs Lab 09/22/15 2015 09/23/15 0006 09/24/15 0306  PHART 7.502* 7.439 7.370  PCO2ART 79.9* 98.3* 91.0*  PO2ART 90.5 74.6* 74.0*   Liver Enzymes No results for input(s): AST, ALT, ALKPHOS, BILITOT, ALBUMIN in the last 168 hours.  Cardiac Enzymes No results for input(s): TROPONINI, PROBNP in the last 168 hours.  Glucose  Recent Labs Lab 09/23/15 0112  GLUCAP 104*   Imaging No results found. STUDIES:  3/26 CXR >> Chronic changes, no acute findings  3/26 CTA Chest >> No acute findings 3/27 MR T-Spine >> Acute compression fracture involving the T8 vertebral body, small layering bilateral pleural effusions   3/27 Echo >> EF 45-50%, moderate aortic stenosis  3/29 CXR >> Left basilar airspace opacity, concerning for PNA, borderline cardiomegaly 4/04 CXR >> Significant improvement in bilateral interstitial markings/pulmonary edema. Blunting left costophrenic angle suggestive of small pleural effusion. 4/5 eeg>>>encpeh  4/5 MRI>>>neg acute  MICROBIOLOGY: MRSA PCR 3/29:  Negative   ANTIBIOTICS: Levaquin 3/31>>> Cefepime 3/29 - 3/31 Vancomycin 3/29 - 3/31  SIGNIFICANT EVENTS: 3/26 >> To ED with Increase SOB, upper back pain, swelling to lower extremities   3/29 >> Rapid response called, hypoxic in the 70s, HR 130-140s, lethargic  4/03 >> Rapid response called w/ lethargy, hypoxic in 50s, HR 120s & BP 94/49 4/6- improved status, no evidence seizure  A/P  1. Acute on chronic hypercarbic respiratory failure:  scheduled budesonide bid & Duoneb q6hr. Solu-Medrol to 20 bid  No distress, NO role bipap, NO ROLE ACETAZOLAMIDE will  harm and create distress in days, abg reviewed no repeat needed 2. Acute encephalopathy: resolved, eeg and MRI reviewed 3. Severe Aortic Stenosis:  Cautious diuresis, folow BP, when able start low dose 4. Atrial Fibrillation w/ RVR: resolved, tele, fall risk noted 5. Hypotension:  Hold lasix, improved 6. Acute T8 Compression Fracture:  Holding pain medication given altered mental status. 7. Prophylaxis:  Protonix IV qday & Lovenox [place] daily. 8. GI: start full liquids  To triad, tele  Lavon Paganini. Titus Mould, MD, [place] Pgr: [place] [facility]'

## 2015-09-24 NOTE — Progress Notes (Signed)
eLink Physician-Brief Progress Note Patient Name: KARIANNA PACHA DOB: 1941-03-04 MRN: [id number]   Date of Service  09/24/2015  HPI/Events of Note  Patient with AMS (confusion and agitation), family member at bedside requesting ABG,possibly due to hypercarbia.  Offered bipap, but family requested ABG prior to bipap initiation.  eICU Interventions  ABG ordered.      Intervention Category Major Interventions: Delirium, psychosis, severe agitation - evaluation and management  Mcihael Hinderman 09/24/2015, 2:20 AM

## 2015-09-24 NOTE — Progress Notes (Signed)
eLink Physician-Brief Progress Note Patient Name: KARALINE WIEBKE DOB: 1941/04/21 MRN: [id number]   Date of Service  09/24/2015  HPI/Events of Note  K=2.8  eICU Interventions  KCl 81meq IV x 1     Intervention Category Intermediate Interventions: Electrolyte abnormality - evaluation and management  Ryota Treece 09/24/2015, 5:11 AM

## 2015-09-24 NOTE — Progress Notes (Signed)
Pt placed in low bed. Bed alarms on. Suction, oxygen and padded side rails on bed. Pt has had 2 loose stools. Max assist.

## 2015-09-24 NOTE — Plan of Care (Signed)
Problem: Activity: Goal: Risk for activity intolerance will decrease Outcome: Progressing Pt ambulated to [facility] 3X today, 1 assist. Tolerated movement well. Unable to get accurate intake and output because of BM.   Per daughter pt suffers from constipation. Today she had 3 formed stools, 1 loose stool with several smears, and another formed stool. Normal per daughter.   Pt has had intermittent episodes of agitation, 3 times pt has pulled off 02 when family was not in room. Family assisting to help keep it on. Pt pulled off 02 probe. Moved to toe, to make it less noticeable. Pt has poor safety awareness. Bed alarm utilized. Pt noncompliant with using call bed. Bed pushed to window for safety. Low bed obtained. Padded rails on. Suction at bedside.    [place] to X2, new since admission according to family. Family voiced concerns, emotional support given.   Granddaughter, Janett Billow, informed me she was to be contacted by [facility] once they come up and do their daily rounds. [facility] made aware. Pt's Daughter in room with pt at this time.

## 2015-09-24 NOTE — Progress Notes (Signed)
Nutrition Follow-up  DOCUMENTATION CODES:   Severe malnutrition in context of chronic illness  INTERVENTION:  Ensure Enlive po BID, each supplement provides 350 kcal and 20 grams of protein  NUTRITION DIAGNOSIS:   Malnutrition related to chronic illness as evidenced by severe depletion of body fat, severe depletion of muscle mass. Ongoing.   GOAL:   Patient will meet greater than or equal to 90% of their needs Progressing.   MONITOR:   PO intake, Supplement acceptance, Labs, Weight trends  ASSESSMENT:   75yo female with severe COPD, mod/severe AS initially admitted 3/26 with acute on chronic respiratory from AECOPD and decompensated heart failure. She was treated with steroids, diuresis, BD. Had worsening respiratory status on 3/29 requiring tx SDU and bipap. She improved with bipap and aggressive rx AECOPD. She tx back to floor on 4/1. On 4/3 pt had worsening hypoxia, AMS and was tx back to ICU on bipap and PCCM re-consulted.   Labs reviewed: potassium low 2.8, phosphorus low 2.4, CO2: 45, pCO2 91 CO2 remains elevated however per RN this is her baseline Pt discussed during ICU rounds and with RN. Pt combative this am.  Diet advanced this am, will add supplements.   Diet Order:  Diet full liquid Room service appropriate?: Yes; Fluid consistency:: Thin  Skin:  Reviewed, no issues  Last BM:  4/6  Height:   Ht Readings from Last 1 Encounters:  09/13/15 5\' 3"  (1.6 m)    Weight:   Wt Readings from Last 1 Encounters:  09/24/15 105 lb 6.1 oz (47.8 kg)    Ideal Body Weight:  52.27 kg (kg)  BMI:  Body mass index is 18.67 kg/(m^2).  Estimated Nutritional Needs:   Kcal:  1400-1600  Protein:  70-80 grams  Fluid:  1.5 L/day  EDUCATION NEEDS:   No education needs identified at this time  [place], [place], [place] Pager [phone] After Hours Pager

## 2015-09-24 NOTE — Procedures (Signed)
History: Jill White is an 75 y.o. female patient with altered mental status, and seizure. Routine inpatient EEG was performed for further evaluation.   Patient Active Problem List   Diagnosis Date Noted  . Protein-calorie malnutrition, severe 09/22/2015  . Seizures ([place])   . Acute on chronic respiratory failure with hypoxia and hypercapnia (HCC)   . Encephalopathy acute   . (HFpEF) heart failure with preserved ejection fraction ([place])   . HCAP (healthcare-associated pneumonia) 09/16/2015  . Chronic obstructive pulmonary disease ([place])   . SOB (shortness of breath)   . Acute on chronic respiratory failure ([place]) 09/13/2015  . Acute respiratory failure ([place]) 09/13/2015  . Arthritis of right shoulder region 09/03/2015  . Chronic obstructive pulmonary disease, unspecified COPD, unspecified chronic bronchitis type 05/18/2015  . Acute sinusitis 05/18/2015  . History of panic attacks 02/19/2015  . 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 Cachexia ([place]) 07/23/2014  . Smoking 07/23/2014  . Chronic respiratory failure with hypercapnia ([place]) 07/12/2014  . CN (constipation)   . Restless leg syndrome   . Palliative care encounter   . Compression fracture of thoracic spine, non-traumatic ([place])   . Back pain 05/12/2014  . Chest pain 05/12/2014  . Compression deformity of vertebra 05/12/2014  . LBBB (left bundle branch block) 05/12/2014  . Thoracic spine fracture ([place]) 05/12/2014  . Non-traumatic compression fracture of T6 thoracic vertebra 05/12/2014  . 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 COLD (chronic obstructive lung disease) ([place]) 01/24/2014  . Mild aortic stenosis 11/27/2013  . CAP (community acquired pneumonia) 04/29/2013  . Dehydration with hyponatremia 06/15/2012  . COPD exacerbation ([place]) 06/15/2012  . Duodenal ulcer 07/12/2011  . Hyponatremia 07/06/2011  . Abdominal pain 07/06/2011  . RLS (restless legs  syndrome) 07/06/2011  . Cigarette smoker 07/06/2011  . COPD (chronic obstructive pulmonary disease) ([place]) 07/06/2011  . HIP PAIN 03/06/2007  . Essential hypertension 02/09/2007     Current facility-administered medications:  .  0.9 %  sodium chloride infusion, , Intravenous, Continuous, Javier Glazier, MD, Last Rate: 10 mL/hr at 09/23/15 1100 .  acetaminophen (TYLENOL) tablet 650 mg, 650 mg, Oral, Q6H PRN, 650 mg at 09/20/15 1544 **OR** acetaminophen (TYLENOL) suppository 650 mg, 650 mg, Rectal, Q6H PRN, Rise Patience, MD .  albuterol (PROVENTIL) (2.5 MG/3ML) 0.083% nebulizer solution 2.5 mg, 2.5 mg, Nebulization, Q2H PRN, Rise Patience, MD, 2.5 mg at 09/21/15 0304 .  antiseptic oral rinse (CPC / CETYLPYRIDINIUM CHLORIDE 0.05%) solution 7 mL, 7 mL, Mouth Rinse, q12n4p, Orson Eva, MD, 7 mL at 09/23/15 1600 .  budesonide (PULMICORT) nebulizer solution 0.5 mg, 0.5 mg, Nebulization, BID, Marijean Heath, NP, 0.5 mg at 09/23/15 1935 .  chlorhexidine (PERIDEX) 0.12 % solution 15 mL, 15 mL, Mouth Rinse, BID, Orson Eva, MD, 15 mL at 09/23/15 2100 .  diclofenac sodium (VOLTAREN) 1 % transdermal gel 2 g, 2 g, Topical, QID, Orson Eva, MD, 2 g at 09/23/15 2135 .  diltiazem (CARDIZEM) 100 mg in dextrose 5 % 100 mL (1 mg/mL) infusion, 5-15 mg/hr, Intravenous, Titrated, Javier Glazier, MD, Stopped at 09/22/15 2230 .  enoxaparin (LOVENOX) injection 40 mg, 40 mg, Subcutaneous, Q24H, Rise Patience, MD, 40 mg at 09/23/15 2143 .  hydrALAZINE (APRESOLINE) injection 10 mg, 10 mg, Intravenous, Q4H PRN, [name], MD, 10 mg at 09/22/15 0507 .  ipratropium-albuterol (DUONEB) 0.5-2.5 (3) MG/3ML nebulizer solution 3 mL, 3 mL, Nebulization, Q6H WA, Orson Eva,  MD, 3 mL at 09/23/15 1934 .  levETIRAcetam (KEPPRA) 1,500 mg in sodium chloride 0.9 % 100 mL IVPB, 1,500 mg, Intravenous, Q12H, Minal Stuller Fuller Mandril, MD, 1,500 mg at 09/23/15 2340 .  Living Better with Heart Failure Book, ,  Does not apply, Once, Shanon Brow Tat, MD .  methylPREDNISolone sodium succinate (SOLU-MEDROL) 40 mg/mL injection 40 mg, 40 mg, Intravenous, Q24H, Javier Glazier, MD, 40 mg at 09/24/15 0513 .  ondansetron (ZOFRAN) tablet 4 mg, 4 mg, Oral, Q6H PRN **OR** ondansetron (ZOFRAN) injection 4 mg, 4 mg, Intravenous, Q6H PRN, Rise Patience, MD, 4 mg at 09/24/15 0506 .  pantoprazole (PROTONIX) injection 40 mg, 40 mg, Intravenous, Q24H, Orson Eva, MD, 40 mg at 09/24/15 0739 .  potassium chloride 10 mEq in 100 mL IVPB, 10 mEq, Intravenous, Q1 Hr x 4, Vishal Mungal, MD, 10 mEq at 09/24/15 0746 .  prochlorperazine (COMPAZINE) injection 5 mg, 5 mg, Intravenous, Q6H PRN, Ritta Slot, NP, 5 mg at 09/18/15 2349 .  sodium chloride flush (NS) 0.9 % injection 3 mL, 3 mL, Intravenous, Q12H, Rise Patience, MD, 3 mL at 09/23/15 2144 .  valproate (DEPACON) 125 mg in dextrose 5 % 50 mL IVPB, 125 mg, Intravenous, 4 times per day, Tou Hayner Fuller Mandril, MD, 125 mg at 09/24/15 [id number]   Introduction:  This is a 19 channel routine scalp EEG performed at the bedside with bipolar and monopolar montages arranged in accordance to the international 10/20 system of electrode placement. One channel was dedicated to EKG recording.   Findings:  Mild background slowing mostly in the range of 7-8 Hz is noted throughout the recording. No definite evidence of abnormal epileptiform discharges or electrographic seizures were noted during this recording.   Impression:  Mildly abnormal awake and drowsy routine inpatient EEG suggestive of mild encephalopathy. No abnormal epileptiform discharges are noted. Clinical correlation is recommended .

## 2015-09-25 ENCOUNTER — Institutional Professional Consult (permissible substitution): Payer: Medicare Other | Admitting: Cardiovascular Disease

## 2015-09-25 DIAGNOSIS — I5030 Unspecified diastolic (congestive) heart failure: Secondary | ICD-10-CM

## 2015-09-25 DIAGNOSIS — R410 Disorientation, unspecified: Secondary | ICD-10-CM | POA: Insufficient documentation

## 2015-09-25 LAB — BASIC METABOLIC PANEL
Anion gap: 9 (ref 5–15)
BUN: 17 mg/dL (ref 6–20)
CALCIUM: 8.8 mg/dL — AB (ref 8.9–10.3)
CO2: 44 mmol/L — ABNORMAL HIGH (ref 22–32)
CREATININE: 0.36 mg/dL — AB (ref 0.44–1.00)
Chloride: 87 mmol/L — ABNORMAL LOW (ref 101–111)
GFR calc non Af Amer: >60 mL/min (ref >60)
Glucose, Bld: 92 mg/dL (ref 65–99)
Potassium: 3.4 mmol/L — ABNORMAL LOW (ref 3.5–5.1)
SODIUM: 140 mmol/L (ref 135–145)

## 2015-09-25 LAB — BLOOD GAS, ARTERIAL
ACID-BASE EXCESS: 20.8 mmol/L — AB (ref 0.0–2.0)
Bicarbonate: 47.3 mEq/L — ABNORMAL HIGH (ref 20.0–24.0)
Drawn by: 100061
O2 Content: 3 L/min
O2 SAT: 86.7 %
PATIENT TEMPERATURE: 98.6
PCO2 ART: 82.6 mmHg — AB (ref 35.0–45.0)
PO2 ART: 55.1 mmHg — AB (ref 80.0–100.0)
TCO2: 49.8 mmol/L (ref 0–100)
pH, Arterial: 7.376 (ref 7.350–7.450)

## 2015-09-25 LAB — TROPONIN I
Troponin I: 0.12 ng/mL — ABNORMAL HIGH (ref <0.031)
Troponin I: 0.11 ng/mL — ABNORMAL HIGH (ref <0.031)

## 2015-09-25 LAB — PHOSPHORUS: PHOSPHORUS: 2.1 mg/dL — AB (ref 2.5–4.6)

## 2015-09-25 LAB — MAGNESIUM: MAGNESIUM: 1.8 mg/dL (ref 1.7–2.4)

## 2015-09-25 MED ORDER — QUETIAPINE 12.5 MG HALF TABLET
12.5000 mg | ORAL_TABLET | Freq: Every day | ORAL | Status: DC
  Administered 2015-09-25 – 2015-09-27 (×3): 12.5 mg via ORAL
  Filled 2015-09-25 (×4): qty 1

## 2015-09-25 MED ORDER — GI COCKTAIL ~~LOC~~
30.0000 mL | Freq: Once | ORAL | Status: AC
  Administered 2015-09-25: 15 mL via ORAL
  Filled 2015-09-25: qty 30

## 2015-09-25 MED ORDER — DILTIAZEM HCL ER COATED BEADS 120 MG PO CP24
120.0000 mg | ORAL_CAPSULE | Freq: Every day | ORAL | Status: DC
  Administered 2015-09-25 – 2015-09-26 (×2): 120 mg via ORAL
  Filled 2015-09-25 (×2): qty 1

## 2015-09-25 MED ORDER — NITROGLYCERIN 0.4 MG SL SUBL
0.4000 mg | SUBLINGUAL_TABLET | SUBLINGUAL | Status: DC | PRN
Start: 2015-09-25 — End: 2015-09-28
  Administered 2015-09-25: 0.4 mg via SUBLINGUAL
  Filled 2015-09-25 (×2): qty 1

## 2015-09-25 MED ORDER — PANTOPRAZOLE SODIUM 40 MG PO TBEC
40.0000 mg | DELAYED_RELEASE_TABLET | Freq: Every day | ORAL | Status: DC
  Administered 2015-09-26 – 2015-09-28 (×3): 40 mg via ORAL
  Filled 2015-09-25 (×3): qty 1

## 2015-09-25 MED ORDER — POTASSIUM PHOSPHATES 15 MMOLE/5ML IV SOLN
20.0000 meq | Freq: Once | INTRAVENOUS | Status: DC
  Filled 2015-09-25: qty 4.55

## 2015-09-25 NOTE — Progress Notes (Signed)
Patient would only drink half of the GI cocktail and then when RN and patient's daughter asked her if she had any more pain patient replied no.  Patient does seem to have increased lethargy at this time, some difficulty arousing patient, even by patient's granddaughter Janett Billow (RN has asked patient questions at times and patient will respond and then when RN asks another question patient will close eyes and stop responding to RN).  Patients granddaughter Janett Billow calling patient Jill White and asking about her pain.  Patient remained with eyes closed.  Janett Billow then ran her fingers through patient's hair and asked patient about her pain.  Patient's eyes remained closed.  Janett Billow then rubbed patient on the chest and patient opened eyes and answered no when [place] asked patient if she was in pain.  RN called E Link to update.

## 2015-09-25 NOTE — Progress Notes (Signed)
RN entered room because patient was yelling out ahhhhhh.  RN asked patient what can I do to help and patient repeated help me help me help.  RN asked what can I help you with, patient then stated she was cold.  RN covered patient up with a sheet and a blanket and patient started yelling ahhhhhhh again.  RN asked why are you yelling and patient stated she was cold.  RN stated to patient she would get another blanket to cover her up with.  While RN retrieving blanket patient started yelling ahhhhhh again.  While RN covering patient up with second blanket RN asked orientation questions one at a time.  Patient answered each question appropriately:  the year was 2017, she was at the hospital, she is at the hospital because she is sick and her name is Jill White.

## 2015-09-25 NOTE — Progress Notes (Signed)
CSW received consult for SNF placement- per MD note at this time plan is for pt return home with hospice care and/or possible transfer to [facility] hospital due to pt negative history with [facility]  CSW signing off at this time  Please reconsult if needed  Domenica Reamer, [place] Worker [phone]

## 2015-09-25 NOTE — Progress Notes (Signed)
Physical Therapy Treatment Patient Details Name: Jill White MRN: [id number] DOB: Aug 04, 1940 Today's Date: 09/25/2015    History of Present Illness pt with acute on chronic respiratory failure and seizure activity.  pt with hx of COPD, Aortic Stenosis, CHF, HTN, RLS, Pulmonary HTN, Mitral Regurgitation, and Bil Cataract Extraction.      PT Comments    During PT session the patient was able to demonstrated improved bed mobility (min assist) and able to perform sit-to-stand with hand held assist X2 (min assist). SpO2 was variable throughout with readings in low 90s to low 80s with poor waveform. HR increasing to 120s. Continue to recommend SNF when medically stable. Resting comfortably after session.    Follow Up Recommendations  SNF     Equipment Recommendations  None recommended by PT    Recommendations for Other Services       Precautions / Restrictions Precautions Precautions: Fall Precaution Comments: O2 saturation Restrictions Weight Bearing Restrictions: No    Mobility  Bed Mobility Overal bed mobility: Needs Assistance;+2 for physical assistance Bed Mobility: Supine to Sit;Sit to Supine     Supine to sit: Min assist Sit to supine: Min guard   General bed mobility comments: Total assist to scoot up in bed, pt not following cues to attempt.  Transfers Overall transfer level: Needs assistance Equipment used: 2 person hand held assist Transfers: Sit to/from Stand Sit to Stand: Min assist;+2 physical assistance         General transfer comment: Stood at EOB, pt able to move LEs in low march (3X per leg).   Ambulation/Gait                 Stairs            Wheelchair Mobility    Modified Rankin (Stroke Patients Only)       Balance Overall balance assessment: Needs assistance Sitting-balance support: Bilateral upper extremity supported Sitting balance-Leahy Scale: Poor Sitting balance - Comments: using UE support   Standing balance  support: Bilateral upper extremity supported Standing balance-Leahy Scale: Poor                      Cognition Arousal/Alertness: Awake/alert Behavior During Therapy: Flat affect Overall Cognitive Status: No family/caregiver present to determine baseline cognitive functioning                      Exercises      General Comments General comments (skin integrity, edema, etc.): During activity SpO2 varying between 93% and 81% with inconsistent waveform. Pt does report feeling tired but able to communicate easily. HR to 120 with standing.       Pertinent Vitals/Pain Pain Assessment: Faces Faces Pain Scale: No hurt    Home Living                      Prior Function            PT Goals (current goals can now be found in the care plan section) Acute Rehab PT Goals Patient Stated Goal: get up PT Goal Formulation: With family Time For Goal Achievement: 10/07/15 Potential to Achieve Goals: Fair Progress towards PT goals: Progressing toward goals    Frequency  Min 2X/week    PT Plan Current plan remains appropriate    Co-evaluation             End of Session Equipment Utilized During Treatment: Oxygen;Gait belt Activity Tolerance: Patient limited by  fatigue Patient left: in bed;with call bell/phone within reach;with bed alarm set     Time: [id number] PT Time Calculation (min) (ACUTE ONLY): 14 min  Charges:  $Therapeutic Activity: 8-22 mins                    G Codes:      Cassell Clement, PT, CSCS Pager [phone] Office [phone]  09/25/2015, 12:57 PM

## 2015-09-25 NOTE — Plan of Care (Signed)
Problem: Phase II Progression Outcomes Goal: Dyspnea controlled w/progressive activity Outcome: Progressing Patient to and from bedside commode (stand and pivot) x3 thus far this shift, RN did not note any shortness of breath with that activity.

## 2015-09-25 NOTE — Progress Notes (Signed)
Telemetry called to notify RN that they were not receiving signal on patient.  RN entered room and patient had removed all heart monitor leads.  Patient has removed leads multiple times tonight.  RN has explained several times what heart monitor leads are for and why they are attached to patient.  RN also noted several times patient attempting to take off oxygen/nasal cannula.  RN asked patient to stop and to leave the oxygen/nasal cannula on because that was helping her breath.  Each time RN has asked patient to stop removing the oxygen she does but minutes later will attempt to remove again.  Patient's granddaughter Janett Billow at bedside assisting to redirect patient.

## 2015-09-25 NOTE — Progress Notes (Signed)
Patient's granddaughter came to nurses station to notify RN that patient was complaining of chest pain.  RN into assess.  Patient complaining of chest pain in the anterior, lower center chest (more epigastric region when patient shows RN where pain is)    EKG completed.  Patient unable to rate pain using numeric pain scale.  RN then asked patient if it hurt a little bit or a lot and patient stated it hurt a lot.  Patient described the pain as squeezing.  RN asked patient if pain was constant or did it come and go.  Patient replied it comes and goes.  Patient also complained of nausea at this time.  Zofran administered per PRN order.  E Link called, awaiting callback.

## 2015-09-25 NOTE — Progress Notes (Signed)
eLink Physician-Brief Progress Note Patient Name: Jill White DOB: Jun 15, 1941 MRN: [id number]   Date of Service  09/25/2015  HPI/Events of Note  Patient c/o of pain in the epigastic area.  Described as an intermittent squeezing/pressure type pain.  Patient is also confused.  HD stable with BP of 138/97 HR of 96 and sats of 95%.  EKG shows LBBB  eICU Interventions  Plan: PRN NTG GI Cocktail Trop times one     Intervention Category Intermediate Interventions: Pain - evaluation and management  DETERDING,ELIZABETH 09/25/2015, 2:07 AM

## 2015-09-25 NOTE — Progress Notes (Signed)
Interval History:                                                                                                                      Jill White is an 75 y.o. female patient with  with multiple medical comorbidities , recent new onset seizures, currently on Depakote 250 mg twice a day and Keppra 1500 mg twice a day. She's been having some sundowning symptoms which she was started on Seroquel 12.5 mg at bedtime. No symptoms in agitation issues. Although she tries to climb out of bed-she is amenable to redirection.  No further seizures or significant altered mental status cyst of any clinical seizure-like events. She does have some intermittent confusion and disorientation, likely from delirium due to acute hospitalization.   Her granddaughter Janett Billow , who is her health  care proxy was at bedside.   Past Medical History: Past Medical History  Diagnosis Date  . HIP PAIN   . HYPERTENSION   . Restless leg syndrome   . COPD (chronic obstructive pulmonary disease) (HCC)     a. Multiple prior admissions for acute hypoxic respiratory failure in setting of COPD exacerbations +/- pneumonia.  . Peptic ulcer disease     a. 2013: gastric antrum.  Marland Kitchen Hyponatremia   . Tobacco abuse   . Hypomagnesemia   . LBBB (left bundle branch block)     a. Intermittent, likely rate related.  . Pulmonary HTN ([place])     a. Elevated PA pressure by prior echoes.  . Mitral regurgitation     a. Mild MR by echo 2014, not mentioned on 11/2013 (severely calcified annulus).  Marland Kitchen Respiratory failure ([place])     a. Adm 11/2013 with severe hypercarbic and hypoxemic respiratory failure requiring intubation.   . Abnormal echocardiogram     a. 11/2013: decreased EF 45-50%, moderate AS- > subsequent cath 12/02/13 with mildly calcified cors without obstruction, mild AS, EF 60%, mild pulm HTN.  Marland Kitchen Aortic stenosis     a. 11/2013: mod by echo, then mild by cath.  . Pulmonary HTN ([place])     a. 11/2013: mild by cath.  . Wide-complex  tachycardia ([place])     a. Felt likely atrial tach with rate-related LBBB.  Marland Kitchen Chronic diastolic CHF (congestive heart failure) ([place])   . Diverticulitis of colon (without mention of hemorrhage) 08/09/2013  . Septic shock ([place]) 12/11/2013  . Acute on chronic combined systolic and diastolic congestive heart failure, NYHA class 4 ([place]) 11/27/2013    Past Surgical History  Procedure Laterality Date  . Abdominal hysterectomy      vaginal hysterectomy  . Dilation and curettage of uterus    . Tonsillectomy    . Esophagogastroduodenoscopy  07/09/2011    Procedure: ESOPHAGOGASTRODUODENOSCOPY (EGD);  Surgeon: Missy Sabins, MD;  Location: [facility];  Service: Endoscopy;  Laterality: N/A;  patient in room 1532  . Cataract extraction w/ intraocular lens  implant, bilateral Bilateral 03/2013    Patient claims it was within the  month.   . Left and right heart catheterization with coronary angiogram N/A 12/02/2013    Procedure: LEFT AND RIGHT HEART CATHETERIZATION WITH CORONARY ANGIOGRAM;  Surgeon: Blane Ohara, MD;  Location: [facility] CATH LAB;  Service: Cardiovascular;  Laterality: N/A;  . Laparoscopic sigmoid colectomy  07-03-14    per Dr. Malachi Carl in [place]     Family History: Family History  Problem Relation Age of Onset  . Heart disease Sister   . Heart disease Brother   . Dementia Mother   . Anemia Father     Social History:   reports that she has been smoking Cigarettes.  She has smoked for the past 50 years. She has never used smokeless tobacco. She reports that she does not drink alcohol or use illicit drugs.  Allergies:  Allergies  Allergen Reactions  . Tetanus Toxoid Anaphylaxis  . Penicillins Hives    Has patient had a PCN reaction causing immediate rash, facial/tongue/throat swelling, SOB or lightheadedness with hypotension: unknown  Has patient had a PCN reaction causing severe rash involving mucus membranes or skin necrosis: No  Has patient had a PCN reaction that  required hospitalization: No  Has patient had a PCN reaction occurring within the last 10 years: No  If all of the above answers are "NO", then may proceed with Cephalosporin use.   . Codeine Nausea And Vomiting and Other (See Comments)    Itching and faint   . Erythromycin Nausea And Vomiting  . Gabapentin     REACTION: severe edema, nausea, dizziness, disorientation  . Levaquin [Levofloxacin In D5w] Other (See Comments)    Joint pain; (states she can take it IV- not by mouth)  . Meloxicam Itching  . Methylprednisolone Hives  . Prednisone Other (See Comments)    GI bleed     Medications:                                                                                                                         Current facility-administered medications:  .  acetaminophen (TYLENOL) tablet 650 mg, 650 mg, Oral, Q6H PRN, 650 mg at 09/20/15 1544 **OR** acetaminophen (TYLENOL) suppository 650 mg, 650 mg, Rectal, Q6H PRN, Rise Patience, MD .  albuterol (PROVENTIL) (2.5 MG/3ML) 0.083% nebulizer solution 2.5 mg, 2.5 mg, Nebulization, Q2H PRN, Rise Patience, MD, 2.5 mg at 09/21/15 0304 .  antiseptic oral rinse (CPC / CETYLPYRIDINIUM CHLORIDE 0.05%) solution 7 mL, 7 mL, Mouth Rinse, q12n4p, Orson Eva, MD, 7 mL at 09/25/15 1600 .  budesonide (PULMICORT) nebulizer solution 0.5 mg, 0.5 mg, Nebulization, BID, Marijean Heath, NP, 0.5 mg at 09/25/15 0828 .  chlorhexidine (PERIDEX) 0.12 % solution 15 mL, 15 mL, Mouth Rinse, BID, Orson Eva, MD, 15 mL at 09/25/15 1000 .  diclofenac sodium (VOLTAREN) 1 % transdermal gel 2 g, 2 g, Topical, QID, Orson Eva, MD, 2 g at 09/25/15 1800 .  diltiazem (CARDIZEM CD) 24 hr capsule 120 mg, 120 mg, Oral,  QHS, Geradine Girt, DO .  divalproex (DEPAKOTE) DR tablet 250 mg, 250 mg, Oral, Q12H, David L Rinehuls, PA-C, 250 mg at 09/25/15 1000 .  enoxaparin (LOVENOX) injection 40 mg, 40 mg, Subcutaneous, Q24H, Rise Patience, MD, 40 mg at 09/24/15 2219 .   feeding supplement (ENSURE ENLIVE) (ENSURE ENLIVE) liquid 237 mL, 237 mL, Oral, BID BM, [name], MD, 237 mL at 09/25/15 1000 .  hydrALAZINE (APRESOLINE) injection 10 mg, 10 mg, Intravenous, Q4H PRN, [name], MD, 10 mg at 09/22/15 0507 .  ipratropium-albuterol (DUONEB) 0.5-2.5 (3) MG/3ML nebulizer solution 3 mL, 3 mL, Nebulization, Q6H WA, Orson Eva, MD, 3 mL at 09/25/15 1431 .  levETIRAcetam (KEPPRA) tablet 1,500 mg, 1,500 mg, Oral, BID, David L Rinehuls, PA-C, 1,500 mg at 09/25/15 1000 .  Living Better with Heart Failure Book, , Does not apply, Once, Shanon Brow Tat, MD .  methylPREDNISolone sodium succinate (SOLU-MEDROL) 40 mg/mL injection 20 mg, 20 mg, Intravenous, Q24H, [name], MD, 20 mg at 09/24/15 1349 .  nitroGLYCERIN (NITROSTAT) SL tablet 0.4 mg, 0.4 mg, Sublingual, Q5 min PRN, Colbert Coyer, MD, 0.4 mg at 09/25/15 0318 .  ondansetron (ZOFRAN) tablet 4 mg, 4 mg, Oral, Q6H PRN **OR** ondansetron (ZOFRAN) injection 4 mg, 4 mg, Intravenous, Q6H PRN, Rise Patience, MD, 4 mg at 09/25/15 0150 .  [START ON 09/26/2015] pantoprazole (PROTONIX) EC tablet 40 mg, 40 mg, Oral, Daily, Karren Cobble, RPH .  potassium phosphate 20 mEq in dextrose 5 % 250 mL infusion, 20 mEq, Intravenous, Once, [name] U Vann, DO .  prochlorperazine (COMPAZINE) injection 5 mg, 5 mg, Intravenous, Q6H PRN, Ritta Slot, NP, 5 mg at 09/18/15 2349 .  QUEtiapine (SEROQUEL) tablet 12.5 mg, 12.5 mg, Oral, QHS, [name] U Vann, DO .  sodium chloride flush (NS) 0.9 % injection 3 mL, 3 mL, Intravenous, Q12H, Rise Patience, MD, 3 mL at 09/25/15 1000   Neurologic Examination:                                                                                                     Today's Vitals   09/25/15 0823 09/25/15 1431 09/25/15 1922 09/25/15 2044  BP:   110/80   Pulse:   115   Temp:   98.9 F (37.2 C) 98.1 F (36.7 C)  TempSrc:   Oral Axillary  Resp:   23   Height:    5\' 1"   (1.549 m)  Weight:    47.6 kg (104 lb 15 oz)  SpO2: 92% 93% 92%   PainSc:       Sitting at the edge of the bed, not in acute distress. No nystagmus or gaze deviation. Follow simple commands. Able to move all 4 extremities symmetrically.  Mild postural and kinetic tremor in upper extremities noted, which she had at baseline.    Lab Results: Basic Metabolic Panel:  Recent Labs Lab 09/21/15 0445 09/22/15 0310 09/23/15 2134 09/24/15 0312 09/25/15 0458  NA 140 141 142 141 140  K 3.6 3.5 3.5 2.8* 3.4*  CL 77* 71*  85* 86* 87*  CO2 >50* >50* 45* 45* 44*  GLUCOSE 110* 144* 97 98 92  BUN 16 21* 21* 21* 17  CREATININE 0.51 0.53 0.44 0.43* 0.36*  CALCIUM 9.0 8.8* 8.8* 8.7* 8.8*  MG 1.7  --  1.9 1.9 1.8  PHOS  --   --   --  2.4* 2.1*    Liver Function Tests: No results for input(s): AST, ALT, ALKPHOS, BILITOT, PROT, ALBUMIN in the last 168 hours. No results for input(s): LIPASE, AMYLASE in the last 168 hours. No results for input(s): AMMONIA in the last 168 hours.  CBC:  Recent Labs Lab 09/19/15 0521 09/21/15 0445 09/22/15 0310 09/24/15 0312  WBC 10.8* 8.1 6.2 10.7*  HGB 13.0 13.6 13.2 12.7  HCT 40.2 45.2 44.1 41.9  MCV 96.6 100.9* 102.1* 102.7*  PLT 279 256 242 196    Cardiac Enzymes:  Recent Labs Lab 09/25/15 0458 09/25/15 0958  TROPONINI 0.12* 0.11*    Lipid Panel: No results for input(s): CHOL, TRIG, HDL, CHOLHDL, VLDL, LDLCALC in the last 168 hours.  CBG:  Recent Labs Lab 09/23/15 0112  GLUCAP 104*    Microbiology: Results for orders placed or performed during the hospital encounter of 09/13/15  MRSA PCR Screening     Status: None   Collection Time: 09/16/15  8:13 PM  Result Value Ref Range Status   MRSA by PCR NEGATIVE NEGATIVE Final    Comment:        The GeneXpert MRSA Assay (FDA approved for NASAL specimens only), is one component of a comprehensive MRSA colonization surveillance program. It is not intended to diagnose MRSA infection nor  to guide or monitor treatment for MRSA infections.     Imaging: Mr Herby Abraham Contrast  09/23/2015  CLINICAL DATA:  Seizure. Patient originally admitted for aspirate distress. Had seizure in ICU. EXAM: MRI HEAD WITHOUT CONTRAST TECHNIQUE: Multiplanar, multiecho pulse sequences of the brain and surrounding structures were obtained without intravenous contrast. COMPARISON:  None. FINDINGS: No acute infarct, hemorrhage, or mass lesion is present. The ventricles are of normal size. No significant extraaxial fluid collection is present. Mild periventricular and subcortical white matter changes are present bilaterally. Matter changes extend into the brainstem. The cerebellum is unremarkable. The auditory canals are within normal limits bilaterally. Flow is present the major intracranial arteries. Bilateral lens replacements are noted. A fluid level is present in the left maxillary sinus. The remaining paranasal sinuses are clear. There is fluid in the right mastoid air cells. No obstructing nasopharyngeal lesion is evident. Dedicated imaging the temporal lobes demonstrate symmetric size and signal of the hippocampal structures. No focal or acute abnormality is present to correlate with seizure activity. Skullbase is within normal limits. Midline sagittal images are. Degenerative changes are noted in the upper cervical spine. IMPRESSION: 1. No acute or focal abnormality to account for seizures. 2. Mild white matter changes likely reflect the sequela of chronic microvascular ischemia. Electronically Signed   By: San Morelle M.D.   On: 09/23/2015 07:26   Dg Chest Port 1 View  09/22/2015  CLINICAL DATA:  Respiratory failure. EXAM: PORTABLE CHEST 1 VIEW COMPARISON:  09/21/2015. FINDINGS: Mediastinum hilar structures normal. Stable cardiomegaly. Interim near complete clearing of pulmonary interstitial edema. No focal infiltrate. Small left pleural effusion. No pneumothorax. IMPRESSION: Interim near complete  clearing of pulmonary interstitial edema. Small left pleural effusion. Electronically Signed   By: Marcello Moores  Register   On: 09/22/2015 07:11    Assessment and plan:   Jill White is an 75 y.o. female patient with multiple medical comorbidities, new onset seizures, currently doing well from seizure standpoint. She is on Depakote 250 minutes twice a day and Keppra 1500 mg twice a day, tolerating both medications without certain problems. She has acute delirium due to hospitalization and is on Seroquel 12.5 g at bedtime.  Discussed at length about her neurological management, follow-up for her seizure medications with outpatient neurology in about her acute delirium with patient's granddaughter. Answered several of her questions to her satisfaction.  No new recommendations from neurology service. Will sign off.

## 2015-09-25 NOTE — Care Management Note (Addendum)
Case Management Note  Patient Details  Name: AHNIYA GOGGIN MRN: [id number] Date of Birth: 1940-07-31  Subjective/Objective: Pt a transfer from [facility] to [facility].Pt with severe COPD, initially admitted 3/26 with acute on chronic respiratory from COPD and decompensated heart failure. She was treated with steroids and diuresis. Had worsening respiratory status on 3/29 requiring tx SDU and bipap. She improved with bipap.Per conversation with daughter pt was from home alone prior to admission. Daughter did ask for private duty agency list. CM did provide daughter with information.   Action/Plan: PT recommendations for SNF. CSW is aware. CM will continue to monitor.   Expected Discharge Date:                  Expected Discharge Plan:  [place]  In-House Referral:     Discharge planning Services  CM Consult  Post Acute Care Choice:    Choice offered to:     DME Arranged:    DME Agency:     HH Arranged:    [place] Agency:     Status of Service:  In process, will continue to follow  Medicare Important Message Given:  Other (see comment) Date Medicare IM Given:    Medicare IM give by:    Date Additional Medicare IM Given:    Additional Medicare Important Message give by:     If discussed at [place] of Stay Meetings, dates discussed:    Additional Comments:1531 Plan will be for an acute to acute transfer. Palliative to Consult.  No further needs from CM at this time.   Bethena Roys, RN 09/25/2015, 3:00 PM

## 2015-09-25 NOTE — Progress Notes (Signed)
Patient able to answer orientation questions appropriately (RN asked patient what the year was and patient replied 2017) but confusion noted.  Patient stated to RN she needed to get up and take a shower and eat breakfast.  RN explained to patient it was not breakfast time it was midnight and RN explained to patient she wasn't able to take a shower at this time due to her heart being continuously monitored as well as her oxygen level.  Patient stated understanding.

## 2015-09-25 NOTE — Progress Notes (Signed)
RN and nurse tech assisted patient back to bed from [facility] (bedside commode).  Patient attempting to remove telemetry leads and oxygen/nasal cannula while nursing staff still present in room.  Nurse tech instructed patient to leave the cords alone.  Patient stops and then starts attempting to remove telemetry leads and oxygen/nasal cannula.  RN and tech attempted to redirect patient again, once RN and tech stop redirecting, patient goes back to attempting to remove heart monitor and oxygen/nasal cannula.  Bilateral mitts applied.

## 2015-09-25 NOTE — Progress Notes (Signed)
PROGRESS NOTE  Jill White [id number] DOB: 1940-10-11 DOA: 09/13/2015 PCP: Laurey Morale, MD  [phone] female with severe COPD, mod/severe AS initially admitted 3/26 with acute on chronic respiratory from AECOPD and decompensated heart failure. She was treated with steroids, diuresis, BD. Had worsening respiratory status on 3/29 requiring tx SDU and bipap. She improved with bipap and aggressive rx AECOPD. She tx back to floor on 4/1. On 4/3 pt had worsening hypoxia, AMS and was tx back to ICU on bipap and PCCM re-consulted.  She was found to be having seizures and transferred to [facility].  She was seen by neurology and started on seizure medications.  Patient was transferred from [facility] to [facility] on 4/7.  Family requested patient be sent back to [facility] as neuro has signed off and patient's husband and sister died at [facility].     Assessment/Plan: Acute on chronic hypercarbic respiratory failure: scheduled budesonide bid & Duoneb q6hr -wean IV Solu-Medrol to 20 bid  -per pulm NO role bipap, NO ROLE ACETAZOLAMIDE will harm and create distress in days -family asks to not repeat ABG unless intubated -O2 goal of 88-90%-- NO HIGHER -flat troponins, most likely from breathing issues  Acute encephalopathy:  -avoid sedating meds -appears to be delirious as mental status waxes and wanes -MPOA, Janett Billow, thinks that mental status is being affected by being at [facility] where both her husband and sister died  Severe Aortic Stenosis: Cautious diuresis, folow BP  Atrial Fibrillation w/ RVR:  -resume cardizem  Hypotension: Hold lasix, improved -resume cardizem  Acute T8 Compression Fracture: Holding pain medication given altered mental status  Seizures: -resume regimen of Keppra 1500 mg twice daily and Depacon 125 mg every 6 hours, a repeat EEG showed no more seizure activity  Hypokalemia -kphos  Dysphagia -SLP eval ordered -nursing reported choking while  eating  delirium -will try low dose seroquel at night  Hypophosphatemia -replete   Spoke at length with MPOA, Janett Billow.  Discussed the multiple severe issues patient was suffering from-- patient has poor overall prognosis.  She is agreeable to a palliative care consult to discuss code status and hospice at home.  Her only request is for patient to be transferred back to [facility] as she believes that patient's may be having anxiety from being at [facility] as her husband and sister both died here. (discussed with AC- ok to arrange)   Code Status: full Family Communication: Janett Billow, MPOA Disposition Plan: palliative care consult--- ultimately, Janett Billow would like to take the patient home: suspect several more days before patient will be ready to go home   Consultants:  Palliative care  Pulm  Cardiology  Procedures:      HPI/Subjective: Patient much calmer when family present, as soon as she left, patient began screaming  Objective: Filed Vitals:   09/25/15 0155 09/25/15 0605  BP: 117/81 136/69  Pulse: 85 25  Temp:  97.3 F (36.3 C)  Resp: 23 14    Intake/Output Summary (Last 24 hours) at 09/25/15 1521 Last data filed at 09/25/15 1329  Gross per 24 hour  Intake    520 ml  Output    325 ml  Net    195 ml   Filed Weights   09/23/15 0456 09/24/15 0500 09/25/15 0350  Weight: 48.6 kg (107 lb 2.3 oz) 47.8 kg (105 lb 6.1 oz) 47.356 kg (104 lb 6.4 oz)    Exam:   General:  Currently calm, answering questions  Cardiovascular: tachy but appears  to be sinus  Respiratory: not moving much air  Abdomen: +Bs, soft  Musculoskeletal: no edema, areas with skin tears   Data Reviewed: Basic Metabolic Panel:  Recent Labs Lab 09/21/15 0445 09/22/15 0310 09/23/15 2134 09/24/15 0312 09/25/15 0458  NA 140 141 142 141 140  K 3.6 3.5 3.5 2.8* 3.4*  CL 77* 71* 85* 86* 87*  CO2 >50* >50* 45* 45* 44*  GLUCOSE 110* 144* 97 98 92  BUN 16 21* 21* 21* 17  CREATININE  0.51 0.53 0.44 0.43* 0.36*  CALCIUM 9.0 8.8* 8.8* 8.7* 8.8*  MG 1.7  --  1.9 1.9 1.8  PHOS  --   --   --  2.4* 2.1*   Liver Function Tests: No results for input(s): AST, ALT, ALKPHOS, BILITOT, PROT, ALBUMIN in the last 168 hours. No results for input(s): LIPASE, AMYLASE in the last 168 hours. No results for input(s): AMMONIA in the last 168 hours. CBC:  Recent Labs Lab 09/19/15 0521 09/21/15 0445 09/22/15 0310 09/24/15 0312  WBC 10.8* 8.1 6.2 10.7*  HGB 13.0 13.6 13.2 12.7  HCT 40.2 45.2 44.1 41.9  MCV 96.6 100.9* 102.1* 102.7*  PLT 279 256 242 196   Cardiac Enzymes:  Recent Labs Lab 09/25/15 0458 09/25/15 0958  TROPONINI 0.12* 0.11*   BNP (last 3 results)  Recent Labs  09/13/15 1636 09/14/15 1700 09/16/15 0433  BNP 203.8* 281.8* 629.6*    ProBNP (last 3 results) No results for input(s): PROBNP in the last 8760 hours.  CBG:  Recent Labs Lab 09/23/15 0112  GLUCAP 104*    Recent Results (from the past 240 hour(s))  MRSA PCR Screening     Status: None   Collection Time: 09/16/15  8:13 PM  Result Value Ref Range Status   MRSA by PCR NEGATIVE NEGATIVE Final    Comment:        The GeneXpert MRSA Assay (FDA approved for NASAL specimens only), is one component of a comprehensive MRSA colonization surveillance program. It is not intended to diagnose MRSA infection nor to guide or monitor treatment for MRSA infections.      Studies: No results found.  Scheduled Meds: . antiseptic oral rinse  7 mL Mouth Rinse q12n4p  . budesonide (PULMICORT) nebulizer solution  0.5 mg Nebulization BID  . chlorhexidine  15 mL Mouth Rinse BID  . diclofenac sodium  2 g Topical QID  . diltiazem  120 mg Oral QHS  . divalproex  250 mg Oral Q12H  . enoxaparin (LOVENOX) injection  40 mg Subcutaneous Q24H  . feeding supplement (ENSURE ENLIVE)  237 mL Oral BID BM  . ipratropium-albuterol  3 mL Nebulization Q6H WA  . levETIRAcetam  1,500 mg Oral BID  . Living Better with  Heart Failure Book   Does not apply Once  . methylPREDNISolone (SOLU-MEDROL) injection  20 mg Intravenous Q24H  . [START ON 09/26/2015] pantoprazole  40 mg Oral Daily  . sodium chloride flush  3 mL Intravenous Q12H   Continuous Infusions:  Antibiotics Given (last 72 hours)    None      Principal Problem:   Acute on chronic respiratory failure (HCC) Active Problems:   COPD (chronic obstructive pulmonary disease) (HCC)   COPD exacerbation (HCC)   Mild aortic stenosis   Back pain   LBBB (left bundle branch block)   Combined systolic and diastolic heart failure (HCC)   Acute respiratory failure (HCC)   Chronic obstructive pulmonary disease (HCC)   SOB (shortness of breath)  HCAP (healthcare-associated pneumonia)   (HFpEF) heart failure with preserved ejection fraction (HCC)   Acute on chronic respiratory failure with hypoxia and hypercapnia (HCC)   Encephalopathy acute   Protein-calorie malnutrition, severe   Seizures ([place])    Time spent: 35 min    [place] [facility] Pager [phone]. If 7PM-7AM, please contact night-coverage at [email or web address], password [facility] 09/25/2015, 3:21 PM  LOS: 12 days

## 2015-09-25 NOTE — Progress Notes (Signed)
eLink Physician-Brief Progress Note Patient Name: Jill White DOB: June 27, 1940 MRN: [id number]   Date of Service  09/25/2015  HPI/Events of Note  Call from nurse reporting that patient is more lethargic then she has been.  No longer c/o of chest pain but not as responsive.  Sats are 95%  HD stable.  Is a CO2 retainer.  eICU Interventions  Plan: Check ABG     Intervention Category Intermediate Interventions: Other:  [name] 09/25/2015, 2:29 AM

## 2015-09-25 NOTE — Progress Notes (Signed)
Pt not tolerating breathing treatments, will not allow mask or hand held. RT attempted to hold for her and she keeps pulling her face away from treatment.

## 2015-09-26 DIAGNOSIS — Z7189 Other specified counseling: Secondary | ICD-10-CM | POA: Insufficient documentation

## 2015-09-26 DIAGNOSIS — R410 Disorientation, unspecified: Secondary | ICD-10-CM

## 2015-09-26 DIAGNOSIS — R569 Unspecified convulsions: Secondary | ICD-10-CM

## 2015-09-26 DIAGNOSIS — Z515 Encounter for palliative care: Secondary | ICD-10-CM | POA: Insufficient documentation

## 2015-09-26 LAB — BASIC METABOLIC PANEL
Anion gap: 7 (ref 5–15)
BUN: 14 mg/dL (ref 6–20)
CHLORIDE: 91 mmol/L — AB (ref 101–111)
CO2: 43 mmol/L — ABNORMAL HIGH (ref 22–32)
CREATININE: 0.32 mg/dL — AB (ref 0.44–1.00)
Calcium: 8.5 mg/dL — ABNORMAL LOW (ref 8.9–10.3)
GFR calc Af Amer: >60 mL/min (ref >60)
GLUCOSE: 117 mg/dL — AB (ref 65–99)
POTASSIUM: 3.4 mmol/L — AB (ref 3.5–5.1)
SODIUM: 141 mmol/L (ref 135–145)

## 2015-09-26 LAB — CBC
HEMATOCRIT: 38.2 % (ref 36.0–46.0)
Hemoglobin: 12.3 g/dL (ref 12.0–15.0)
MCH: 30.6 pg (ref 26.0–34.0)
MCHC: 32.2 g/dL (ref 30.0–36.0)
MCV: 95 fL (ref 78.0–100.0)
PLATELETS: 181 10*3/uL (ref 150–400)
RBC: 4.02 MIL/uL (ref 3.87–5.11)
RDW: 13.8 % (ref 11.5–15.5)
WBC: 10.6 10*3/uL — AB (ref 4.0–10.5)

## 2015-09-26 MED ORDER — IPRATROPIUM-ALBUTEROL 0.5-2.5 (3) MG/3ML IN SOLN
3.0000 mL | Freq: Three times a day (TID) | RESPIRATORY_TRACT | Status: DC
  Administered 2015-09-26 – 2015-09-27 (×5): 3 mL via RESPIRATORY_TRACT
  Filled 2015-09-26 (×8): qty 3

## 2015-09-26 MED ORDER — POTASSIUM CHLORIDE CRYS ER 20 MEQ PO TBCR
40.0000 meq | EXTENDED_RELEASE_TABLET | Freq: Once | ORAL | Status: DC
Start: 2015-09-26 — End: 2015-09-26

## 2015-09-26 NOTE — Progress Notes (Signed)
PROGRESS NOTE  Jill White [id number] DOB: 1941-01-20 DOA: 09/13/2015 PCP: Laurey Morale, MD  [phone] female with severe COPD, mod/severe AS initially admitted 3/26 with acute on chronic respiratory from AECOPD and decompensated heart failure. She was treated with steroids, diuresis, BD. Had worsening respiratory status on 3/29 requiring tx SDU and bipap. She improved with bipap and aggressive rx AECOPD. She tx back to floor on 4/1. On 4/3 pt had worsening hypoxia, AMS and was tx back to ICU on bipap and PCCM re-consulted.  She was found to be having seizures and transferred to [facility]  She was seen by neurology and started on seizure medications.  Patient was transferred from [facility] to [facility] on 4/7.  Family requested patient be sent back to [facility] as neuro has signed off and patient's husband and sister died at [facility].     Assessment/Plan: Acute on chronic hypercarbic respiratory failure: scheduled budesonide bid & Duoneb q6hr -slowly continue weaning steroids  -per pulm NO role bipap, NO ROLE ACETAZOLAMIDE will harm and create distress in days -family asks to not repeat ABG unless patient is intubated -O2 goal of 88-90%-- NO HIGHER -flat troponins, most likely from breathing issues and demand ischemia -continue O2 supplementation and follow response -Palliative care has been consulted.  Acute encephalopathy: multifactorial (due to hypercarbia, medications/narcotics, seizures/post ictal and delirium) -avoid narcotics -associated with acute delirium along with seizures.. -improved/better with seroquel -continue constant reo-rientation  Severe Aortic Stenosis: Cautious diuresis, folow BP. -no further work up or candidate for further intervention  Atrial Fibrillation w/ RVR: -resumed cardizem  Hypotension: improved/stable now -resumed cardizem  Acute T8 Compression Fracture: Holding pain medication given altered mental status. Will try changing positions and use  of tylenol   Seizures: -resume regimen of Keppra 1500 mg twice daily and Depacon 125 mg every 6 hours, a repeat EEG showed no more seizure activity. -patient stable and not too somnolent with medication regimen   Hypokalemia -will replete as needed and monitor trend  Dysphagia -SLP eval ordered; will follow rec's -nursing reported choking while eating  delirium -will continue low dose seroquel at night -mentation improving  -continue constant reorientation   Hypophosphatemia -replete as needed    Code Status: full Family Communication: Janett Billow, MPOA Disposition Plan: palliative care consult--- will follow [place] meeting results. Keep in stepdown for today   Consultants:  Palliative care  Pulm  Cardiology  Procedures:  See below for x-ray reports  EEG: with initial findings consistent for seizure activity   HPI/Subjective: Patient oriented X 2, answering questions properly and following commands. No CP, no abd pain and reports breathing is stable. No fever.  Objective: Filed Vitals:   09/26/15 0400 09/26/15 0800  BP: 128/67   Pulse: 97 107  Temp: 98 F (36.7 C) 98.2 F (36.8 C)  Resp: 22 29    Intake/Output Summary (Last 24 hours) at 09/26/15 1421 Last data filed at 09/26/15 0500  Gross per 24 hour  Intake    360 ml  Output    250 ml  Net    110 ml   Filed Weights   09/25/15 0350 09/25/15 2044 09/26/15 0500  Weight: 47.356 kg (104 lb 6.4 oz) 47.6 kg (104 lb 15 oz) 48.3 kg (106 lb 7.7 oz)    Exam:   General:  Currently calm, answering questions appropriately, oriented X 2 and appears in no distress. Patient requiring oxygen supplementation to keep O2 sat > 89%  Cardiovascular: tachycardic, but appears to be sinus  on monitor; no rubs or gallops  Respiratory:improvement in air movement, no wheezing appreciated  Abdomen: +Bs, soft  Musculoskeletal: no edema, some areas with skin tears and bruises   Data Reviewed: Basic Metabolic  Panel:  Recent Labs Lab 09/21/15 0445 09/22/15 0310 09/23/15 2134 09/24/15 0312 09/25/15 0458 09/26/15 0747  NA 140 141 142 141 140 141  K 3.6 3.5 3.5 2.8* 3.4* 3.4*  CL 77* 71* 85* 86* 87* 91*  CO2 >50* >50* 45* 45* 44* 43*  GLUCOSE 110* 144* 97 98 92 117*  BUN 16 21* 21* 21* 17 14  CREATININE 0.51 0.53 0.44 0.43* 0.36* 0.32*  CALCIUM 9.0 8.8* 8.8* 8.7* 8.8* 8.5*  MG 1.7  --  1.9 1.9 1.8  --   PHOS  --   --   --  2.4* 2.1*  --    CBC:  Recent Labs Lab 09/21/15 0445 09/22/15 0310 09/24/15 0312 09/26/15 0747  WBC 8.1 6.2 10.7* 10.6*  HGB 13.6 13.2 12.7 12.3  HCT 45.2 44.1 41.9 38.2  MCV 100.9* 102.1* 102.7* 95.0  PLT 256 242 196 181   Cardiac Enzymes:  Recent Labs Lab 09/25/15 0458 09/25/15 0958  TROPONINI 0.12* 0.11*   BNP (last 3 results)  Recent Labs  09/13/15 1636 09/14/15 1700 09/16/15 0433  BNP 203.8* 281.8* 629.6*   CBG:  Recent Labs Lab 09/23/15 0112  GLUCAP 104*    Recent Results (from the past 240 hour(s))  MRSA PCR Screening     Status: None   Collection Time: 09/16/15  8:13 PM  Result Value Ref Range Status   MRSA by PCR NEGATIVE NEGATIVE Final    Comment:        The GeneXpert MRSA Assay (FDA approved for NASAL specimens only), is one component of a comprehensive MRSA colonization surveillance program. It is not intended to diagnose MRSA infection nor to guide or monitor treatment for MRSA infections.      Studies: No results found.  Scheduled Meds: . antiseptic oral rinse  7 mL Mouth Rinse q12n4p  . budesonide (PULMICORT) nebulizer solution  0.5 mg Nebulization BID  . chlorhexidine  15 mL Mouth Rinse BID  . diclofenac sodium  2 g Topical QID  . diltiazem  120 mg Oral QHS  . divalproex  250 mg Oral Q12H  . enoxaparin (LOVENOX) injection  40 mg Subcutaneous Q24H  . feeding supplement (ENSURE ENLIVE)  237 mL Oral BID BM  . ipratropium-albuterol  3 mL Nebulization TID  . levETIRAcetam  1,500 mg Oral BID  . Living  Better with Heart Failure Book   Does not apply Once  . methylPREDNISolone (SOLU-MEDROL) injection  20 mg Intravenous Q24H  . pantoprazole  40 mg Oral Daily  . potassium phosphate IVPB (mEq)  20 mEq Intravenous Once  . QUEtiapine  12.5 mg Oral QHS  . sodium chloride flush  3 mL Intravenous Q12H   Continuous Infusions:  Antibiotics Given (last 72 hours)    None      Principal Problem:   Acute on chronic respiratory failure (HCC) Active Problems:   COPD (chronic obstructive pulmonary disease) (HCC)   COPD exacerbation (HCC)   Mild aortic stenosis   Back pain   LBBB (left bundle branch block)   Combined systolic and diastolic heart failure (HCC)   Acute respiratory failure (HCC)   Chronic obstructive pulmonary disease (HCC)   SOB (shortness of breath)   HCAP (healthcare-associated pneumonia)   (HFpEF) heart failure with preserved ejection fraction ([place])  Acute on chronic respiratory failure with hypoxia and hypercapnia (HCC)   Encephalopathy acute   Protein-calorie malnutrition, severe   Seizures ([place])   Acute delirium   Encounter for palliative care   Goals of care, counseling/discussion    Time spent: 35 min    Barton Dubois  [facility] Pager [phone]. If 7PM-7AM, please contact night-coverage at [email or web address], password [email or web address] 09/26/2015, 2:21 PM  LOS: 13 days

## 2015-09-26 NOTE — Progress Notes (Signed)
SLP Cancellation Note  Patient Details Name: Jill White MRN: [id number] DOB: 1940/12/20   Cancelled treatment:       Reason Eval/Treat Not Completed: Patient declined, had previous BSE on 09/17/15   ADAMS,PAT, M.S., CCC-SLP 09/26/2015, 3:35 PM

## 2015-09-26 NOTE — Consult Note (Signed)
Consultation Note Date: 09/26/2015   Patient Name: Jill White  DOB: June 22, 1940  MRN: [id number]  Age / Sex: 75 y.o., female  PCP: Laurey Morale, MD Referring Physician: Barton Dubois, MD  Reason for Consultation: Establishing goals of care    Clinical Assessment/Narrative:  75 year old lady with a history of severe COPD, moderate-severe aortic stenosis. Patient was admitted on 3-26 with acute on chronic respiratory failure due to decompensated heart failure as well as advanced COPD. She has since been treated with steroids diureses and bronchodilators. On 329 she had worsening respiratory status requiring transfer to stepdown unit and use of BiPAP. Patient was transferred back to the floor on 4-1 however by 4-3 had worsening hypoxic respiratory failure, altered mental status and was transferred back to the ICU on BiPAP. Of note hospital course this time is also complicated by patient having been found to have seizures and recent sundowning as well.  Patient is now back at [facility]. Palliative care consult for goals of care discussions. Neurology has been following recently. Patient is placed on antiepileptic drugs Depakote and Keppra, maintained on Seroquel for sundowning. Her CODE STATUS is currently full code. She lives at home by herself. She has 3 children. She is currently on 4 L of oxygen nasal cannula. At home she is only on 2 L. Palliative care consult continues  Patient is a weak frail elderly lady resting in the bed. She is awake and alert. She does not appear confused. She does not appear anxious. She answers all questions appropriately. Her daughter is present at the bedside. Granddaughter just is not present at the bedside. Call placed and discussed briefly with discoid [phone]. Family meeting set up for 4-9 at 1400 for additional discussions. Continue current scope of treatment. Agree that a more  palliative based approach to care seems to be more medically appropriate. Additionally will also have CODE STATUS discussions in family meeting on 4-9.     Contacts/Participants in Discussion: Primary Decision Maker:     Relationship to Patient   HCPOA: yes   grand daughter Jill White helps make decisions for the patient.   SUMMARY OF RECOMMENDATIONS:  Briefly introduced myself and scope of palliative service.  Call placed and discussed with grand daughter Jill White: Family meeting set up for goals of care discussions on 09-27-15 in the patient's room at 1400.  Further recommendations will follow.    Code Status/Advance Care Planning: Full code    Code Status Orders        Start     Ordered   09/13/15 2152  Full code   Continuous     09/13/15 2152    Code Status History    Date Active Date Inactive Code Status Order ID Comments User Context   02/01/2015 10:26 PM 02/06/2015  7:40 PM Full Code [id number]  Etta Quill, DO ED   05/12/2014  7:57 PM 05/22/2014  7:22 PM Full Code [id number]  Waldemar Dickens, MD ED   12/02/2013  3:09 PM 12/03/2013  7:19 PM Full Code [id number]  [name] Mocha, MD Inpatient   12/01/2013  9:04 AM 12/02/2013  3:09 PM Full Code [id number]  Jacolyn Reedy, MD Inpatient   11/28/2013  9:20 AM 12/01/2013  9:04 AM Partial Code [id number]  Marijean Heath, NP Inpatient   11/23/2013  2:39 AM 11/28/2013  9:20 AM Full Code [id number]  Samella Parr, NP Inpatient   11/22/2013  3:07 PM 11/23/2013  2:39 AM DNR [id number]  Ripudeep K  Rai, MD ED   04/29/2013  5:17 PM 05/03/2013  6:15 PM Full Code [id number]  Eugenie Filler, MD Inpatient   06/15/2012  6:12 PM 06/19/2012  5:07 PM Full Code [id number]  Bynum Bellows, MD Inpatient   07/07/2011  2:08 AM 07/12/2011  8:21 PM Full Code [id number]  Marisa Cyphers, RN ED      Other Directives:Other  Symptom Management:    continue current management.   Palliative Prophylaxis:   Bowel Regimen  Psycho-social/Spiritual:  Support  System: [place] Desire for further Chaplaincy support:no Additional Recommendations: Caregiving  Support/Resources  Prognosis: Unable to determine  Discharge Planning: pendig hospital course, would recommend home with hospice, will discuss this is family meeting.    Chief Complaint/ Primary Diagnoses: Present on Admission:  . COPD (chronic obstructive pulmonary disease) ([place]) . Acute on chronic respiratory failure ([place]) . COPD exacerbation ([place]) . Back pain . Acute respiratory failure ([place]) . LBBB (left bundle branch block) . Combined systolic and diastolic heart failure ([employer])  I have reviewed the medical record, interviewed the patient and family, and examined the patient. The following aspects are pertinent.  Past Medical History  Diagnosis Date  . HIP PAIN   . HYPERTENSION   . Restless leg syndrome   . COPD (chronic obstructive pulmonary disease) (HCC)     a. Multiple prior admissions for acute hypoxic respiratory failure in setting of COPD exacerbations +/- pneumonia.  . Peptic ulcer disease     a. 2013: gastric antrum.  Marland Kitchen Hyponatremia   . Tobacco abuse   . Hypomagnesemia   . LBBB (left bundle branch block)     a. Intermittent, likely rate related.  . Pulmonary HTN ([place])     a. Elevated PA pressure by prior echoes.  . Mitral regurgitation     a. Mild MR by echo 2014, not mentioned on 11/2013 (severely calcified annulus).  Marland Kitchen Respiratory failure ([place])     a. Adm 11/2013 with severe hypercarbic and hypoxemic respiratory failure requiring intubation.   . Abnormal echocardiogram     a. 11/2013: decreased EF 45-50%, moderate AS- > subsequent cath 12/02/13 with mildly calcified cors without obstruction, mild AS, EF 60%, mild pulm HTN.  Marland Kitchen Aortic stenosis     a. 11/2013: mod by echo, then mild by cath.  . Pulmonary HTN ([place])     a. 11/2013: mild by cath.  . Wide-complex tachycardia ([place])     a. Felt likely atrial tach with rate-related LBBB.  Marland Kitchen Chronic diastolic CHF (congestive  heart failure) ([place])   . Diverticulitis of colon (without mention of hemorrhage) 08/09/2013  . Septic shock ([place]) 12/11/2013  . Acute on chronic combined systolic and diastolic congestive heart failure, NYHA class 4 ([place]) 11/27/2013   Social History   Social History  . Marital Status: Widowed    Spouse Name: N/A  . Number of Children: N/A  . Years of Education: N/A   Social History Main Topics  . Smoking status: Current Every Day Smoker -- 50 years    Types: Cigarettes  . Smokeless tobacco: Never Used     Comment: 1/2 pack per day  . Alcohol Use: No  . Drug Use: No  . Sexual Activity: No   Other Topics Concern  . None   Social History Narrative   Family History  Problem Relation Age of Onset  . Heart disease Sister   . Heart disease Brother   . Dementia Mother   . Anemia Father  Scheduled Meds: . antiseptic oral rinse  7 mL Mouth Rinse q12n4p  . budesonide (PULMICORT) nebulizer solution  0.5 mg Nebulization BID  . chlorhexidine  15 mL Mouth Rinse BID  . diclofenac sodium  2 g Topical QID  . diltiazem  120 mg Oral QHS  . divalproex  250 mg Oral Q12H  . enoxaparin (LOVENOX) injection  40 mg Subcutaneous Q24H  . feeding supplement (ENSURE ENLIVE)  237 mL Oral BID BM  . ipratropium-albuterol  3 mL Nebulization TID  . levETIRAcetam  1,500 mg Oral BID  . Living Better with Heart Failure Book   Does not apply Once  . methylPREDNISolone (SOLU-MEDROL) injection  20 mg Intravenous Q24H  . pantoprazole  40 mg Oral Daily  . potassium phosphate IVPB (mEq)  20 mEq Intravenous Once  . QUEtiapine  12.5 mg Oral QHS  . sodium chloride flush  3 mL Intravenous Q12H   Continuous Infusions:  PRN Meds:.acetaminophen **OR** acetaminophen, albuterol, hydrALAZINE, nitroGLYCERIN, ondansetron **OR** ondansetron (ZOFRAN) IV, prochlorperazine Medications Prior to Admission:  Prior to Admission medications   Medication Sig Start Date End Date Taking? Authorizing Provider  aspirin 81 MG  tablet Take 81 mg by mouth every other day.    Yes Historical Provider, MD  clonazePAM (KLONOPIN) 1 MG tablet TAKE 1 TABLET BY MOUTH NIGHTLY AT BEDTIME AS NEEDED Patient taking differently: Take 1 mg by mouth at bedtime. For restless legs 06/26/15  Yes Laurey Morale, MD  diltiazem (CARDIZEM CD) 120 MG 24 hr capsule Take 1 capsule (120 mg total) by mouth daily. Patient taking differently: Take 120 mg by mouth at bedtime.  08/25/15  Yes Fay Records, MD  furosemide (LASIX) 20 MG tablet TAKE 1&1/2 TABLET BY MOUTH EVERY DAY 08/14/15  Yes Fay Records, MD  ondansetron (ZOFRAN) 4 MG tablet Take 4 mg by mouth every 4 (four) hours as needed for nausea or vomiting.   Yes Historical Provider, MD  PROAIR HFA 108 (90 BASE) MCG/ACT inhaler INHALE TWO PUFFS BY MOUTH EVERY FOUR HOURS AS NEEDED Patient taking differently: INHALE TWO PUFFS BY MOUTH EVERY FOUR HOURS AS NEEDED shortness of breath/wheezing 10/31/14  Yes Laurey Morale, MD  Respiratory Therapy Supplies (FLUTTER) DEVI Use as directed 01/26/15  Yes Tanda Rockers, MD  tiotropium (SPIRIVA) 18 MCG inhalation capsule Place 1 capsule (18 mcg total) into inhaler and inhale daily. 12/03/13  Yes Erick Colace, NP  pantoprazole (PROTONIX) 40 MG tablet Take 1 tablet (40 mg total) by mouth daily. X 2 wks Patient not taking: Reported on 09/13/2015 08/04/15   Rigoberto Noel, MD  SYMBICORT 80-4.5 MCG/ACT inhaler INHAKLE 2 PUFFS INTO THE LUNGS TWICE DAILY 09/21/15   Brand Males, MD   Allergies  Allergen Reactions  . Tetanus Toxoid Anaphylaxis  . Penicillins Hives    Has patient had a PCN reaction causing immediate rash, facial/tongue/throat swelling, SOB or lightheadedness with hypotension: unknown  Has patient had a PCN reaction causing severe rash involving mucus membranes or skin necrosis: No  Has patient had a PCN reaction that required hospitalization: No  Has patient had a PCN reaction occurring within the last 10 years: No  If all of the above answers are "NO",  then may proceed with Cephalosporin use.   . Codeine Nausea And Vomiting and Other (See Comments)    Itching and faint   . Erythromycin Nausea And Vomiting  . Gabapentin     REACTION: severe edema, nausea, dizziness, disorientation  . Levaquin [Levofloxacin  In D5w] Other (See Comments)    Joint pain; (states she can take it IV- not by mouth)  . Meloxicam Itching  . Methylprednisolone Hives  . Prednisone Other (See Comments)    GI bleed    Review of Systems + for weakness  Physical Exam Frail weak elderly lady Ecchymosis on upper extremities S1 S2 Clear anteriorly abomen soft No edema Awake alert at present, in no distress currently.   Vital Signs: BP 128/67 mmHg  Pulse 107  Temp(Src) 98.2 F (36.8 C) (Oral)  Resp 29  Ht 5\' 1"  (1.549 m)  Wt 48.3 kg (106 lb 7.7 oz)  BMI 20.13 kg/m2  SpO2 91%  SpO2: SpO2: 91 % O2 Device:SpO2: 91 % O2 Flow Rate: .O2 Flow Rate (L/min): 5 L/min  IO: Intake/output summary:  Intake/Output Summary (Last 24 hours) at 09/26/15 1320 Last data filed at 09/26/15 0500  Gross per 24 hour  Intake    360 ml  Output    400 ml  Net    -40 ml    LBM: Last BM Date: 09/24/15 Baseline Weight: Weight: 50.032 kg (110 lb 4.8 oz) Most recent weight: Weight: 48.3 kg (106 lb 7.7 oz)      Palliative Assessment/Data:  Flowsheet Rows        Most Recent Value   Intake Tab    Referral Department  Hospitalist   Unit at Time of Referral  ICU   Palliative Care Primary Diagnosis  Pulmonary   Date Notified  09/25/15   Palliative Care Type  New Palliative care   Reason for referral  Clarify Goals of Care   Date of Admission  09/13/15   Date first seen by Palliative Care  09/26/15   # of days IP prior to Palliative referral  12   Clinical Assessment    Palliative Performance Scale Score  30%   Pain Max last 24 hours  3   Pain Min Last 24 hours  2   Dyspnea Max Last 24 Hours  3   Dyspnea Min Last 24 hours  2   Psychosocial & Spiritual Assessment     Palliative Care Outcomes    Patient/Family meeting held?  Yes   Who was at the meeting?  patient, daughter   Palliative Care Outcomes  Clarified goals of care   Palliative Care follow-up planned  Yes, Facility      Additional Data Reviewed:  CBC:    Component Value Date/Time   WBC 10.6* 09/26/2015 0747   HGB 12.3 09/26/2015 0747   HCT 38.2 09/26/2015 0747   PLT 181 09/26/2015 0747   MCV 95.0 09/26/2015 0747   NEUTROABS 12.0* 09/16/2015 0433   LYMPHSABS 1.1 09/16/2015 0433   MONOABS 0.9 09/16/2015 0433   EOSABS 0.0 09/16/2015 0433   BASOSABS 0.0 09/16/2015 0433   Comprehensive Metabolic Panel:    Component Value Date/Time   NA 141 09/26/2015 0747   K 3.4* 09/26/2015 0747   CL 91* 09/26/2015 0747   CO2 43* 09/26/2015 0747   BUN 14 09/26/2015 0747   CREATININE 0.32* 09/26/2015 0747   GLUCOSE 117* 09/26/2015 0747   CALCIUM 8.5* 09/26/2015 0747   AST 21 09/13/2015 1636   ALT 20 09/13/2015 1636   ALKPHOS 65 09/13/2015 1636   BILITOT 1.1 09/13/2015 1636   PROT 7.1 09/13/2015 1636   ALBUMIN 4.0 09/13/2015 1636     Time In: 1200 Time Out: 1300 Time Total: 60 min  Greater than 50%  of this time was  spent counseling and coordinating care related to the above assessment and plan.  Signed by: Loistine Chance, MD [place], MD  09/26/2015, 1:20 PM  Please contact Palliative Medicine Team phone at [phone] for questions and concerns.

## 2015-09-26 NOTE — Progress Notes (Signed)
Pt has been awake the entire night.  She has climbed out of bed dozens of times, she has taken her oxygen off dozens of times even with mittens on.  She is extremely anxious and confused with brief periods of lucid moments. Fara Olden P

## 2015-09-27 DIAGNOSIS — E43 Unspecified severe protein-calorie malnutrition: Secondary | ICD-10-CM

## 2015-09-27 LAB — CREATININE, SERUM
CREATININE: 0.35 mg/dL — AB (ref 0.44–1.00)
GFR calc Af Amer: >60 mL/min (ref >60)
GFR calc non Af Amer: >60 mL/min (ref >60)

## 2015-09-27 MED ORDER — DILTIAZEM HCL ER COATED BEADS 180 MG PO CP24
180.0000 mg | ORAL_CAPSULE | Freq: Every day | ORAL | Status: DC
  Administered 2015-09-27: 180 mg via ORAL
  Filled 2015-09-27 (×2): qty 1

## 2015-09-27 MED ORDER — POTASSIUM CHLORIDE CRYS ER 20 MEQ PO TBCR
20.0000 meq | EXTENDED_RELEASE_TABLET | Freq: Every day | ORAL | Status: DC
  Administered 2015-09-27 – 2015-09-28 (×2): 20 meq via ORAL
  Filled 2015-09-27 (×2): qty 1

## 2015-09-27 MED ORDER — METHYLPREDNISOLONE SODIUM SUCC 40 MG IJ SOLR
40.0000 mg | Freq: Every day | INTRAMUSCULAR | Status: DC
  Filled 2015-09-27: qty 1

## 2015-09-27 NOTE — Progress Notes (Signed)
Daily Progress Note   Patient Name: Jill White       Date: 09/27/2015 DOB: 1940-09-02  Age: 75 y.o. MRN#: [id number] Attending Physician: Barton Dubois, MD Primary Care Physician: Laurey Morale, MD Admit Date: 09/13/2015  Reason for Consultation/Follow-up: Establishing goals of care  Subjective:  patient is awake alert resting by the edge of the bed.  Interval Events:  Family meeting: Met with the patient and his grand daughter at the bedside. Patient lives at home by herself, daughter and grand daughter help with her care at home, she has advanced home care, she has home O2 2L [place].   Patient has moderate to severe aortic stenosis, severe COPD, recent seizures, T8 compression fracture. Hospital course has also been complicated by sun downing. She has been seen by Neurology and has been started on Seroquel and Keppra.   Patient wishes to get out of step down unit and go to another floor today. Discussed about the patient's serious underlying life limiting conditions such as aortic stenosis and COPD. Introduced scope of palliative care. Discussed that we would like to focus on symptom management and we would like to focus on what we can improve in order for her to feel as good as she can for as long she can. Hence, discussed about having hospice support at home. The patient is agreeable. Discussed extensively about what kind of help and support hospice can provide. Patient's granddaughter is also in agreement.  Patient however currently wishes to continue with full CODE STATUS. This has been discussed extensively. The patient becomes anxious and worked up. Granddaughter is present in the room and states in front of the patient that she understands that the burden of CPR and undergoing  resuscitative attempt will cause more harm than good. We will continue to engage in these discussions. Discussed with the patient that DO NOT RESUSCITATE does not mean to not treat. Discussed that even with DO NOT RESUSCITATE status we would continue to treat the patient and make sure that her chronic life limiting conditions or managed as best as they can be managed. Continue to work on building trust relationship with the patient and for engaging in these discussions.  Length of Stay: 14 days  Current Medications: Scheduled Meds:  . antiseptic oral rinse  7 mL Mouth Rinse q12n4p  .  budesonide (PULMICORT) nebulizer solution  0.5 mg Nebulization BID  . chlorhexidine  15 mL Mouth Rinse BID  . diclofenac sodium  2 g Topical QID  . diltiazem  180 mg Oral QHS  . divalproex  250 mg Oral Q12H  . enoxaparin (LOVENOX) injection  40 mg Subcutaneous Q24H  . feeding supplement (ENSURE ENLIVE)  237 mL Oral BID BM  . ipratropium-albuterol  3 mL Nebulization TID  . levETIRAcetam  1,500 mg Oral BID  . Living Better with Heart Failure Book   Does not apply Once  . [START ON 09/28/2015] methylPREDNISolone (SOLU-MEDROL) injection  40 mg Intravenous Daily  . pantoprazole  40 mg Oral Daily  . potassium chloride  20 mEq Oral Daily  . QUEtiapine  12.5 mg Oral QHS  . sodium chloride flush  3 mL Intravenous Q12H    Continuous Infusions:    PRN Meds: acetaminophen **OR** acetaminophen, albuterol, hydrALAZINE, nitroGLYCERIN, ondansetron **OR** ondansetron (ZOFRAN) IV, prochlorperazine  Physical Exam: Physical Exam             Weak frail elderly lady sitting by the edge of the bed In no distress currently S1 S2 Clear anteriorly Abdomen soft No edema Awake alert oriented.   Vital Signs: BP 121/73 mmHg  Pulse 90  Temp(Src) 98.3 F (36.8 C) (Oral)  Resp 18  Ht '5\' 1"'$  (1.549 m)  Wt 48.3 kg (106 lb 7.7 oz)  BMI 20.13 kg/m2  SpO2 90% SpO2: SpO2: 90 % O2 Device: O2 Device: Nasal Cannula O2 Flow Rate:  O2 Flow Rate (L/min): 3 L/min  Intake/output summary:  Intake/Output Summary (Last 24 hours) at 09/27/15 1422 Last data filed at 09/27/15 1200  Gross per 24 hour  Intake    720 ml  Output    925 ml  Net   -205 ml   LBM: Last BM Date: 09/24/15 Baseline Weight: Weight: 50.032 kg (110 lb 4.8 oz) Most recent weight: Weight: 48.3 kg (106 lb 7.7 oz)       Palliative Assessment/Data: Flowsheet Rows        Most Recent Value   Intake Tab    Referral Department  Hospitalist   Unit at Time of Referral  ICU   Palliative Care Primary Diagnosis  Cardiac   Date Notified  09/25/15   Palliative Care Type  Return patient Palliative Care   Reason for referral  Clarify Goals of Care   Date of Admission  09/13/15   Date first seen by Palliative Care  09/26/15   # of days IP prior to Palliative referral  12   Clinical Assessment    Palliative Performance Scale Score  40%   Pain Max last 24 hours  4   Pain Min Last 24 hours  3   Dyspnea Max Last 24 Hours  4   Dyspnea Min Last 24 hours  3   Psychosocial & Spiritual Assessment    Palliative Care Outcomes    Patient/Family meeting held?  Yes   Who was at the meeting?  grand daughter Jill White and the patient.    Palliative Care Outcomes  Clarified goals of care   Palliative Care follow-up planned  No      Additional Data Reviewed: CBC    Component Value Date/Time   WBC 10.6* 09/26/2015 0747   RBC 4.02 09/26/2015 0747   HGB 12.3 09/26/2015 0747   HCT 38.2 09/26/2015 0747   PLT 181 09/26/2015 0747   MCV 95.0 09/26/2015 0747   MCH 30.6 09/26/2015 0747  MCHC 32.2 09/26/2015 0747   RDW 13.8 09/26/2015 0747   LYMPHSABS 1.1 09/16/2015 0433   MONOABS 0.9 09/16/2015 0433   EOSABS 0.0 09/16/2015 0433   BASOSABS 0.0 09/16/2015 0433    CMP     Component Value Date/Time   NA 141 09/26/2015 0747   K 3.4* 09/26/2015 0747   CL 91* 09/26/2015 0747   CO2 43* 09/26/2015 0747   GLUCOSE 117* 09/26/2015 0747   BUN 14 09/26/2015 0747    CREATININE 0.35* 09/27/2015 0324   CALCIUM 8.5* 09/26/2015 0747   PROT 7.1 09/13/2015 1636   ALBUMIN 4.0 09/13/2015 1636   AST 21 09/13/2015 1636   ALT 20 09/13/2015 1636   ALKPHOS 65 09/13/2015 1636   BILITOT 1.1 09/13/2015 1636   GFRNONAA >60 09/27/2015 0324   GFRAA >60 09/27/2015 0324       Problem List:  Patient Active Problem List   Diagnosis Date Noted  . Encounter for palliative care   . Goals of care, counseling/discussion   . Seizure ([place])   . Acute delirium   . Protein-calorie malnutrition, severe 09/22/2015  . Seizures ([place])   . Acute on chronic respiratory failure with hypoxia and hypercapnia (HCC)   . Encephalopathy acute   . (HFpEF) heart failure with preserved ejection fraction ([place])   . HCAP (healthcare-associated pneumonia) 09/16/2015  . Chronic obstructive pulmonary disease ([place])   . SOB (shortness of breath)   . Acute on chronic respiratory failure ([place]) 09/13/2015  . Acute respiratory failure ([place]) 09/13/2015  . Arthritis of right shoulder region 09/03/2015  . Chronic obstructive pulmonary disease, unspecified COPD, unspecified chronic bronchitis type 05/18/2015  . Acute sinusitis 05/18/2015  . History of panic attacks 02/19/2015  . 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 Cachexia ([place]) 07/23/2014  . Smoking 07/23/2014  . Chronic respiratory failure with hypercapnia ([place]) 07/12/2014  . CN (constipation)   . Restless leg syndrome   . Palliative care encounter   . Compression fracture of thoracic spine, non-traumatic ([place])   . Back pain 05/12/2014  . Chest pain 05/12/2014  . Compression deformity of vertebra 05/12/2014  . LBBB (left bundle branch block) 05/12/2014  . Thoracic spine fracture ([place]) 05/12/2014  . Non-traumatic compression fracture of T6 thoracic vertebra 05/12/2014  . 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 COLD (chronic obstructive lung disease) ([place]) 01/24/2014  .  Mild aortic stenosis 11/27/2013  . CAP (community acquired pneumonia) 04/29/2013  . Dehydration with hyponatremia 06/15/2012  . COPD exacerbation ([place]) 06/15/2012  . Duodenal ulcer 07/12/2011  . Hyponatremia 07/06/2011  . Abdominal pain 07/06/2011  . RLS (restless legs syndrome) 07/06/2011  . Cigarette smoker 07/06/2011  . COPD (chronic obstructive pulmonary disease) ([place]) 07/06/2011  . HIP PAIN 03/06/2007  . Essential hypertension 02/09/2007     Palliative Care Assessment & Plan    1.Code Status:  Full code    Code Status Orders        Start     Ordered   09/13/15 2152  Full code   Continuous     09/13/15 2152    Code Status History    Date Active Date Inactive Code Status Order ID Comments User Context   02/01/2015 10:26 PM 02/06/2015  7:40 PM Full Code [id number]  Etta Quill, DO ED   05/12/2014  7:57 PM 05/22/2014  7:22 PM Full Code [id number]  Waldemar Dickens, MD ED   12/02/2013  3:09 PM 12/03/2013  7:19 PM Full Code [id number]  [name] Mocha, MD Inpatient   12/01/2013  9:04 AM 12/02/2013  3:09 PM Full Code [id number]  Jacolyn Reedy, MD Inpatient   11/28/2013  9:20 AM 12/01/2013  9:04 AM Partial Code [id number]  Marijean Heath, NP Inpatient   11/23/2013  2:39 AM 11/28/2013  9:20 AM Full Code [id number]  Samella Parr, NP Inpatient   11/22/2013  3:07 PM 11/23/2013  2:39 AM DNR [id number]  Mendel Corning, MD ED   04/29/2013  5:17 PM 05/03/2013  6:15 PM Full Code [id number]  Eugenie Filler, MD Inpatient   06/15/2012  6:12 PM 06/19/2012  5:07 PM Full Code [id number]  Bynum Bellows, MD Inpatient   07/07/2011  2:08 AM 07/12/2011  8:21 PM Full Code [id number]  Marisa Cyphers, RN ED       2. Goals of Care/Additional Recommendations:  Limitations on Scope of Treatment: Full Scope Treatment  Desire for further Chaplaincy support:no  Psycho-social Needs: Education on Hospice  3. Symptom Management:      1. Continue current mode of care.   4. Palliative Prophylaxis:    Delirium Protocol  5. Prognosis: Unable to determine  6. Discharge Planning:  Home with Hospice   Care plan was discussed with  Patient, grand daughter Jill White.   Thank you for allowing the Palliative Medicine Team to assist in the care of this patient.   Time In:  1345 Time Out: 1420 Total Time 35 Prolonged Time Billed  no        [phone]  Loistine Chance, MD  09/27/2015, 2:22 PM  Please contact Palliative Medicine Team phone at [phone] for questions and concerns.

## 2015-09-27 NOTE — Progress Notes (Signed)
Patient's HR kept going back and forth in the 80's to the 150's. BP was 95/61 MAP 67. Patient was just put back on Cardizem on 09/25/15 and received her 2nd dose today 09/26/15. EKG was performed. MD was notified. Will continue to monitor.

## 2015-09-27 NOTE — Progress Notes (Signed)
PROGRESS NOTE  Jill White [id number] DOB: 02-14-1941 DOA: 09/13/2015 PCP: Laurey Morale, MD  [id number] female with severe COPD, mod/severe AS initially admitted 3/26 with acute on chronic respiratory from AECOPD and decompensated heart failure. She was treated with steroids, diuresis, BD. Had worsening respiratory status on 3/29 requiring tx SDU and bipap. She improved with bipap and aggressive rx AECOPD. She tx back to floor on 4/1. On 4/3 pt had worsening hypoxia, AMS and was tx back to ICU on bipap and PCCM re-consulted.  She was found to be having seizures and transferred to [facility].  She was seen by neurology and started on seizure medications.  Patient was transferred from [facility] to [facility] on 4/7.  Family requested patient be sent back to [facility] as neuro has signed off and patient's husband and sister died at [facility].     Assessment/Plan: Acute on chronic hypercarbic respiratory failure: scheduled budesonide bid & Duoneb q6hr -slowly continue weaning steroids  -per pulm NO role bipap, NO ROLE ACETAZOLAMIDE will harm and create distress in days -family asks to not repeat ABG unless patient is intubated -O2 goal of 88-90%-- NO HIGHER -flat troponins, most likely from breathing issues and demand ischemia -continue O2 supplementation and follow response -Palliative care has been consulted.  Acute encephalopathy: multifactorial (due to hypercarbia, medications/narcotics, seizures/post ictal and delirium) -avoid narcotics -associated with acute delirium along with seizures.. -improved/better with seroquel, will continue for now -continue constant reo-rientation  Severe Aortic Stenosis: Cautious diuresis (currently w/o signs of fluid overload), folow BP. -no further work up or candidate for further intervention  Atrial Fibrillation w/ RVR: -resumed cardizem; dose adjusted to 180mg  for better control  Hypotension: improved/stable now -resumed cardizem -will monitor    Acute T8 Compression Fracture: Holding pain medication given altered mental status. Will try changing positions and use of tylenol   Seizures: -resume regimen of Keppra 1500 mg twice daily and Depacon 125 mg every 6 hours, a repeat EEG showed no more seizure activity. -patient stable and not too somnolent with medication regimen   Hypokalemia -will replete as needed and monitor trend  Dysphagia -SLP eval ordered; but declined by patient -no further episodes of choking  -will monitor   delirium -will continue low dose seroquel at night -mentation continue to improve  -continue constant reorientation -patient AAOX3 and with good insight currently; but nurses reproted sundowning and lack of sleep on 4/8 night   Hypophosphatemia -replete as needed    Code Status: full Family Communication: Janett Billow, MPOA Disposition Plan: palliative care consult--- will follow [place] meeting results. Keep in stepdown for today   Consultants:  Palliative care  Pulm  Cardiology  Procedures:  See below for x-ray reports  EEG: with initial findings consistent for seizure activity   HPI/Subjective: Patient oriented X 3, answering questions properly and following commands. No CP, no abd pain and reports breathing is stable. No fever. HR fluctuating (90's-140's) and still requiring oxygen supplementation   Objective: Filed Vitals:   09/27/15 0400 09/27/15 0800  BP:  139/86  Pulse: 89 34  Temp:  98.3 F (36.8 C)  Resp: 21 32    Intake/Output Summary (Last 24 hours) at 09/27/15 0845 Last data filed at 09/27/15 0800  Gross per 24 hour  Intake    240 ml  Output    700 ml  Net   -460 ml   Filed Weights   09/25/15 0350 09/25/15 2044 09/26/15 0500  Weight: 47.356 kg (104 lb 6.4 oz) 47.6  kg (104 lb 15 oz) 48.3 kg (106 lb 7.7 oz)    Exam:   General:  Currently calm, answering questions appropriately, oriented X 3 and appears in no distress. Patient still requiring oxygen  supplementation to keep O2 sat > 89%  Cardiovascular: tachycardic, but appears to be sinus on monitor; no rubs or gallops  Respiratory:improvement in air movement, no wheezing appreciated  Abdomen: +Bs, soft  Musculoskeletal: no edema, some areas with skin tears and bruises   Data Reviewed: Basic Metabolic Panel:  Recent Labs Lab 09/21/15 0445 09/22/15 0310 09/23/15 2134 09/24/15 0312 09/25/15 0458 09/26/15 0747 09/27/15 0324  NA 140 141 142 141 140 141  --   K 3.6 3.5 3.5 2.8* 3.4* 3.4*  --   CL 77* 71* 85* 86* 87* 91*  --   CO2 >50* >50* 45* 45* 44* 43*  --   GLUCOSE 110* 144* 97 98 92 117*  --   BUN 16 21* 21* 21* 17 14  --   CREATININE 0.51 0.53 0.44 0.43* 0.36* 0.32* 0.35*  CALCIUM 9.0 8.8* 8.8* 8.7* 8.8* 8.5*  --   MG 1.7  --  1.9 1.9 1.8  --   --   PHOS  --   --   --  2.4* 2.1*  --   --    CBC:  Recent Labs Lab 09/21/15 0445 09/22/15 0310 09/24/15 0312 09/26/15 0747  WBC 8.1 6.2 10.7* 10.6*  HGB 13.6 13.2 12.7 12.3  HCT 45.2 44.1 41.9 38.2  MCV 100.9* 102.1* 102.7* 95.0  PLT 256 242 196 181   Cardiac Enzymes:  Recent Labs Lab 09/25/15 0458 09/25/15 0958  TROPONINI 0.12* 0.11*   BNP (last 3 results)  Recent Labs  09/13/15 1636 09/14/15 1700 09/16/15 0433  BNP 203.8* 281.8* 629.6*   CBG:  Recent Labs Lab 09/23/15 0112  GLUCAP 104*   Studies: No results found.  Scheduled Meds: . antiseptic oral rinse  7 mL Mouth Rinse q12n4p  . budesonide (PULMICORT) nebulizer solution  0.5 mg Nebulization BID  . chlorhexidine  15 mL Mouth Rinse BID  . diclofenac sodium  2 g Topical QID  . diltiazem  180 mg Oral QHS  . divalproex  250 mg Oral Q12H  . enoxaparin (LOVENOX) injection  40 mg Subcutaneous Q24H  . feeding supplement (ENSURE ENLIVE)  237 mL Oral BID BM  . ipratropium-albuterol  3 mL Nebulization TID  . levETIRAcetam  1,500 mg Oral BID  . Living Better with Heart Failure Book   Does not apply Once  . methylPREDNISolone (SOLU-MEDROL)  injection  20 mg Intravenous Q24H  . pantoprazole  40 mg Oral Daily  . potassium phosphate IVPB (mEq)  20 mEq Intravenous Once  . QUEtiapine  12.5 mg Oral QHS  . sodium chloride flush  3 mL Intravenous Q12H   Continuous Infusions:  Antibiotics Given (last 72 hours)    None      Principal Problem:   Acute on chronic respiratory failure (HCC) Active Problems:   COPD (chronic obstructive pulmonary disease) (HCC)   COPD exacerbation (HCC)   Mild aortic stenosis   Back pain   LBBB (left bundle branch block)   Combined systolic and diastolic heart failure (HCC)   Acute respiratory failure (HCC)   Chronic obstructive pulmonary disease (HCC)   SOB (shortness of breath)   HCAP (healthcare-associated pneumonia)   (HFpEF) heart failure with preserved ejection fraction (HCC)   Acute on chronic respiratory failure with hypoxia and hypercapnia ([place])  Encephalopathy acute   Protein-calorie malnutrition, severe   Seizures ([place])   Acute delirium   Encounter for palliative care   Goals of care, counseling/discussion   Seizure ([place])    Time spent: 35 min    Barton Dubois  [facility] Pager [phone]. If 7PM-7AM, please contact night-coverage at [email or web address], password [facility] 09/27/2015, 8:45 AM  LOS: 14 days

## 2015-09-28 DIAGNOSIS — I350 Nonrheumatic aortic (valve) stenosis: Secondary | ICD-10-CM | POA: Insufficient documentation

## 2015-09-28 DIAGNOSIS — I5032 Chronic diastolic (congestive) heart failure: Secondary | ICD-10-CM

## 2015-09-28 MED ORDER — LEVETIRACETAM 750 MG PO TABS: 1500.0000 mg | ORAL_TABLET | Freq: Two times a day (BID) | ORAL | Status: AC

## 2015-09-28 MED ORDER — POTASSIUM CHLORIDE CRYS ER 20 MEQ PO TBCR
20.0000 meq | EXTENDED_RELEASE_TABLET | Freq: Every day | ORAL | Status: AC
Start: 2015-09-28 — End: ?

## 2015-09-28 MED ORDER — DILTIAZEM HCL ER COATED BEADS 180 MG PO CP24: 180.0000 mg | ORAL_CAPSULE | Freq: Every day | ORAL | Status: DC

## 2015-09-28 MED ORDER — ENSURE ENLIVE PO LIQD: 237.0000 mL | Freq: Two times a day (BID) | ORAL | Status: AC

## 2015-09-28 MED ORDER — PREDNISONE 20 MG PO TABS: ORAL_TABLET | ORAL | Status: DC

## 2015-09-28 MED ORDER — FUROSEMIDE 20 MG PO TABS: 40.0000 mg | ORAL_TABLET | Freq: Every day | ORAL | Status: AC

## 2015-09-28 MED ORDER — DIVALPROEX SODIUM 250 MG PO DR TAB: 250.0000 mg | DELAYED_RELEASE_TABLET | Freq: Two times a day (BID) | ORAL | Status: AC

## 2015-09-28 MED ORDER — PANTOPRAZOLE SODIUM 40 MG PO TBEC: 40.0000 mg | DELAYED_RELEASE_TABLET | Freq: Every day | ORAL | Status: AC

## 2015-09-28 MED ORDER — CLONAZEPAM 1 MG PO TABS: 0.5000 mg | ORAL_TABLET | Freq: Every day | ORAL | Status: AC

## 2015-09-28 NOTE — Care Management Important Message (Signed)
Important Message  Patient Details  Name: Jill White MRN: [id number] Date of Birth: [birth year]   Medicare Important Message Given:  Yes    Camillo Flaming 09/28/2015, 11:06 AMImportant Message  Patient Details  Name: Jill White MRN: [id number] Date of Birth: 13-Dec-1940   Medicare Important Message Given:  Yes    Camillo Flaming 09/28/2015, 11:05 AM

## 2015-09-28 NOTE — Progress Notes (Signed)
Pt discharged from the unit via wheelchair. Discharge instructions were reviewed with the pt. Pt sent home on home oxygen. [facility] was set up at discharge. No questions or concerns from the pt at this time.   Emanuelle Bastos W Wandy Bossler, RN

## 2015-09-28 NOTE — Evaluation (Signed)
SLP Cancellation Note  Patient Details Name: Jill White MRN: [id number] DOB: 07/11/1940   Cancelled treatment:       Reason Eval/Treat Not Completed: Other (comment) (pt has been seen previously for evaluation and declined to participate in eval over weekend, please reorder if pt agreeable. )  Luanna Salk, [place] [facility] SLP [phone]

## 2015-09-28 NOTE — Care Management Note (Signed)
Case Management Note  Patient Details  Name: Jill White MRN: [id number] Date of Birth: 02-21-1941  Subjective/Objective:         75 yo admitted with Acute on Chronic respiratory failure.  On chronic 02 at home PTA.           Action/Plan: Pt from home alone and plans to go home with [facility] services at discharge.  Pt receives home 02 through [facility] and when offered choice chose [facility] for [facility] services.  Will need MD orders for HHPT/OT/RN/Aide.  [facility] rep contacted for referral.  Expected Discharge Date:                  Expected Discharge Plan:  [place]  In-House Referral:     Discharge planning Services  CM Consult  Post Acute Care Choice:  Home Health Choice offered to:  Patient  DME Arranged:    DME Agency:     HH Arranged:  RN, PT, OT, Nurse's Aide [place] Agency:  [place]  Status of Service:  In process, will continue to follow  Medicare Important Message Given:  Other (see comment) Date Medicare IM Given:    Medicare IM give by:    Date Additional Medicare IM Given:    Additional Medicare Important Message give by:     If discussed at [place] of Stay Meetings, dates discussed:    Additional CommentsLynnell Catalan, RN 09/28/2015, 9:58 AM [phone]

## 2015-09-28 NOTE — Discharge Summary (Signed)
Physician Discharge Summary  Jill White [id number] DOB: [birth year] DOA: 09/13/2015  PCP: Laurey Morale, MD  Admit date: 09/13/2015 Discharge date: 09/28/2015  Time spent: 40 minutes  Recommendations for Outpatient Follow-up:  Needs outpatient follow up with neurology in 2 weeks for further adjustments on her antiepileptic drugs  Repeat phosphorus, magnesium and BMET  During follow up visit Reassess BP and volume status; adjust medications as needed   Discharge Diagnoses:  Principal Problem:   Acute on chronic respiratory failure (Jill White) Active Problems:   COPD (chronic obstructive pulmonary disease) (HCC)   COPD exacerbation (HCC)   Mild aortic stenosis   Back pain   LBBB (left bundle branch block)   Combined systolic and diastolic heart failure (HCC)   Acute respiratory failure (HCC)   Chronic obstructive pulmonary disease (HCC)   SOB (shortness of breath)   HCAP (healthcare-associated pneumonia)   (HFpEF) heart failure with preserved ejection fraction (HCC)   Acute on chronic respiratory failure with hypoxia and hypercapnia (HCC)   Encephalopathy acute   Protein-calorie malnutrition, severe   [name]   Acute delirium   Encounter for palliative care   Goals of care, counseling/discussion   Seizure Jill White)   Discharge Condition: stable and improved. Discharge home with oxygen supplementation and [name] services. Follow up with PCP in 10 days and instructions to follow up with neurology service in 2 weeks.  Diet recommendation: heart healthy diet  Filed Weights   09/26/15 0500 09/27/15 1814 09/28/15 0524  Weight: 48.3 kg (106 lb 7.7 oz) 52.3 kg (115 lb 4.8 oz) 48.6 kg (107 lb 2.3 oz)    History of present illness:  As per H&P written by Dr. Hal Hope on 09/13/15 75 y.o. female with history of severe COPD, moderate aortic stenosis, tachycardia presents to the ER after patient started experiencing increasing shortness of breath after patient started developing upper  back pain in the morning after patient went to the church. Denies any fall or trauma. Pain is mostly in the upper mid back increases on deep inspiration and movement. This led to patient becoming more short of breath. Patient came to the ER. Chest x-ray does not show anything acute. Patient states the pain is mostly in the upper mid back in a square area and does not radiate. On exam patient does not have any definite spine tenderness. Patient was given nebulizer 1 dose of Lasix 20 mg IV and admitted for respiratory failure and further management of back pain. Patient also recently noticed increasing swelling of her lower extremity for which patient started increasing her Lasix dose from 30 mg daily to 40 mg daily. Patient's Cardizem dose was increased a change from 30 mg 4 times daily to 120 mg at bedtime. Also had a recent Holter monitor.   White Course:  Acute on chronic hypercarbic respiratory failure:  -continue home inhaler/nebulizer regimen -slowly continue weaning steroids  -per pulm NO role bipap, NO ROLE ACETAZOLAMIDE will harm and create distress in days -family asks to not repeat ABG unless patient is intubated and understand that intubation is truly not in the best patient interest  -O2 goal of 88-90%-- NO HIGHER -flat troponins, most likely from breathing issues and demand ischemia -continue O2 supplementation and follow response -Palliative care was consulted, patient remains Full code and interest in full spectrum of treatment  Chronic diastolic heart failure -stable -continue lasix daily -advise to watch daily weights and control sodium intake  Acute encephalopathy: multifactorial (due to hypercarbia, medications/narcotics, seizures/post ictal and delirium) -  avoid narcotics -associated with acute delirium along with seizures.. -improved/resolved with seroquel, will discontinue at discharge -continue constant re-orientation  Chronic anxiety/panic attacks: -continue low  dose klonopin at bedtime   Severe Aortic Stenosis: Cautious diuresis (currently w/o signs of fluid overload), folow BP and prevent low BP. -no further work up or candidate for further intervention  Atrial Fibrillation w/ RVR: -resumed cardizem; dose adjusted to 180mg  for better control -HR is now in the 70's to 80's  Essential HTN: with Hypotensive episode -improved/stable now -resumed cardizem and BP/VS remains stable   Acute T8 Compression Fracture: Holding pain medication given altered mental status. Will try changing positions and use of tylenol   Seizures: -resume regimen of Keppra 1500 mg twice daily and Depacon 125 mg every 6 hours, a repeat EEG showed no more seizure activity. -patient stable and not too somnolent with medication regimen  -outpatient follow up with neurology in 2 weeks  Hypokalemia -WNL at discharge -BMET to follow electrolytes at follow up visit  Dysphagia -SLP eval ordered; but declined by patient -no further episodes of choking  -will monitor   Delirium -will continue low dose seroquel at night -mentation continue to improve  -continue constant reorientation -patient AAOX3 and with good insight currently -at discharge will discontinue seroquel and ok to resume low dose klonopin as previously taken for sleep/RLS  Hypophosphatemia -repleted  -outpatient follow up on electrolytes for further repletion as needed    Procedures:  See below for x-ray reports  EEG: with initial findings consistent for seizure activity  Consultations:  Palliative care  Pulm  Cardiology  Discharge Exam: Filed Vitals:   09/28/15 1431 09/28/15 1502  BP: 96/56 126/69  Pulse: 75   Temp: 98 F (36.7 C)   Resp: 20      General: Currently calm, answering questions appropriately, oriented X 3 and appears in no distress. Patient still requiring oxygen supplementation to keep O2 sat > 89%  Cardiovascular: tachycardic, but appears to be sinus on  monitor; no rubs or gallops  Respiratory: improvement in air movement, no wheezing appreciated  Abdomen: +Bs, soft  Musculoskeletal: no edema, some areas with skin tears and bruises  Discharge Instructions   Discharge Instructions    Diet - low sodium heart healthy    Complete by:  As directed      Discharge instructions    Complete by:  As directed   Take medications as prescribed Follow up with neurology in 2 weeks Please arrange follow up with PCP in 10 days Keep yourself well hydrated Wear oxygen 24/7 (2.5-3L; goal is O2 sat not lower than 88-89%)          Current Discharge Medication List    START taking these medications   Details  divalproex (DEPAKOTE) 250 MG DR tablet Take 1 tablet (250 mg total) by mouth every 12 (twelve) hours. Qty: 60 tablet, Refills: 1    feeding supplement, ENSURE ENLIVE, (ENSURE ENLIVE) LIQD Take 237 mLs by mouth 2 (two) times daily between meals. Qty: 237 mL, Refills: 12    levETIRAcetam (KEPPRA) 750 MG tablet Take 2 tablets (1,500 mg total) by mouth 2 (two) times daily. Qty: 120 tablet, Refills: 1    potassium chloride SA (K-DUR,KLOR-CON) 20 MEQ tablet Take 1 tablet (20 mEq total) by mouth daily. Qty: 30 tablet, Refills: 1    predniSONE (DELTASONE) 20 MG tablet Take 1 tablet by mouth daily  For 3 days; then 1/2 tablet by mouth daily X 3 days and stop prednisone  Qty: 6 tablet, Refills: 0      CONTINUE these medications which have CHANGED   Details  clonazePAM (KLONOPIN) 1 MG tablet Take 0.5 tablets (0.5 mg total) by mouth at bedtime. Qty: 90 tablet, Refills: 1    diltiazem (CARDIZEM CD) 180 MG 24 hr capsule Take 1 capsule (180 mg total) by mouth at bedtime. Qty: 30 capsule, Refills: 1    furosemide (LASIX) 20 MG tablet Take 2 tablets (40 mg total) by mouth daily. Qty: 60 tablet, Refills: 1    pantoprazole (PROTONIX) 40 MG tablet Take 1 tablet (40 mg total) by mouth daily. X 2 wks Qty: 30 tablet, Refills: 1   Associated  Diagnoses: COPD exacerbation ([place])      CONTINUE these medications which have NOT CHANGED   Details  aspirin 81 MG tablet Take 81 mg by mouth every other day.     ondansetron (ZOFRAN) 4 MG tablet Take 4 mg by mouth every 4 (four) hours as needed for nausea or vomiting.    PROAIR HFA 108 (90 BASE) MCG/ACT inhaler INHALE TWO PUFFS BY MOUTH EVERY FOUR HOURS AS NEEDED Qty: 8.5 Inhaler, Refills: 3    Respiratory Therapy Supplies (FLUTTER) DEVI Use as directed Qty: 1 each, Refills: 0    tiotropium (SPIRIVA) 18 MCG inhalation capsule Place 1 capsule (18 mcg total) into inhaler and inhale daily. Qty: 30 capsule, Refills: 12    SYMBICORT 80-4.5 MCG/ACT inhaler INHAKLE 2 PUFFS INTO THE LUNGS TWICE DAILY Qty: 10.2 g, Refills: 5       Allergies  Allergen Reactions  . Tetanus Toxoid Anaphylaxis  . Penicillins Hives    Has patient had a PCN reaction causing immediate rash, facial/tongue/throat swelling, SOB or lightheadedness with hypotension: unknown  Has patient had a PCN reaction causing severe rash involving mucus membranes or skin necrosis: No  Has patient had a PCN reaction that required hospitalization: No  Has patient had a PCN reaction occurring within the last 10 years: No  If all of the above answers are "NO", then may proceed with Cephalosporin use.   . Codeine Nausea And Vomiting and Other (See Comments)    Itching and faint   . Erythromycin Nausea And Vomiting  . Gabapentin     REACTION: severe edema, nausea, dizziness, disorientation  . Levaquin [Levofloxacin In D5w] Other (See Comments)    Joint pain; (states she can take it IV- not by mouth)  . Meloxicam Itching  . Methylprednisolone Hives  . Prednisone Other (See Comments)    GI bleed   Follow-up Information    Follow up with FRY,STEPHEN A, MD. Schedule an appointment as soon as possible for a visit in 10 days.   Specialty:  Family Medicine   Contact information:   [place]  [address] [phone]       Follow up with [facility]. Call in 2 weeks.   Contact information:   [address]     [place] [phone]      The results of significant diagnostics from this hospitalization (including imaging, microbiology, ancillary and laboratory) are listed below for reference.    Significant Diagnostic Studies: Dg Shoulder Right  09/15/2015  CLINICAL DATA:  Chronic right shoulder pain.  No known injury. EXAM: RIGHT SHOULDER - 2+ VIEW COMPARISON:  Chest CT 09/13/2015. FINDINGS: Moderate AC joint degenerative changes. Mild glenohumeral joint degenerative changes. No acute bony abnormality or worrisome bone lesion. The visualize right ribs are intact.  The visualize right lung is clear. Stable emphysematous changes and pulmonary scarring. IMPRESSION: AC joint and glenohumeral joint degenerative changes but no acute bony findings. Electronically Signed   By: Marijo Sanes M.D.   On: 09/15/2015 14:12   Ct Angio Chest Pe W/cm &/or Wo Cm  09/13/2015  CLINICAL DATA:  75 year old female with shortness of breath and upper back pain. EXAM: CT ANGIOGRAPHY CHEST WITH CONTRAST TECHNIQUE: Multidetector CT imaging of the chest was performed using the standard protocol during bolus administration of intravenous contrast. Multiplanar CT image reconstructions and MIPs were obtained to evaluate the vascular anatomy. CONTRAST:  152mL OMNIPAQUE IOHEXOL 350 MG/ML SOLN COMPARISON:  Chest radiograph dated 09/13/2015 FINDINGS: There is emphysematous changes of the lungs. No focal consolidation, pleural effusion, or pneumothorax. The central airways are patent. There is atherosclerotic calcification of the thoracic aorta. No CT evidence of pulmonary embolism. There is no cardiomegaly or pericardial effusion. There is coronary vascular calcification. There is no hilar or mediastinal adenopathy. The esophagus and the thyroid gland appear  unremarkable. There is no axillary adenopathy. The chest wall soft tissues appear unremarkable. There is osteopenia with degenerative changes of the spine. C6 compression fracture with anterior wedging, likely old. There is T8 compression fracture with mild sclerotic changes, age indeterminate, likely old. Clinical correlation is recommended. The visualized upper abdomen appears unremarkable. Review of the MIP images confirms the above findings. IMPRESSION: No CT evidence of pulmonary embolism. Osteopenia with multilevel compression fractures of the thoracic spine, age indeterminate, likely old. Clinical correlation is recommended. Electronically Signed   By: Anner Crete M.D.   On: 09/13/2015 23:43   Mr Brain Wo Contrast  09/23/2015  CLINICAL DATA:  Seizure. Patient originally admitted for aspirate distress. Had seizure in ICU. EXAM: MRI HEAD WITHOUT CONTRAST TECHNIQUE: Multiplanar, multiecho pulse sequences of the brain and surrounding structures were obtained without intravenous contrast. COMPARISON:  None. FINDINGS: No acute infarct, hemorrhage, or mass lesion is present. The ventricles are of normal size. No significant extraaxial fluid collection is present. Mild periventricular and subcortical white matter changes are present bilaterally. Matter changes extend into the brainstem. The cerebellum is unremarkable. The auditory canals are within normal limits bilaterally. Flow is present the major intracranial arteries. Bilateral lens replacements are noted. A fluid level is present in the left maxillary sinus. The remaining paranasal sinuses are clear. There is fluid in the right mastoid air cells. No obstructing nasopharyngeal lesion is evident. Dedicated imaging the temporal lobes demonstrate symmetric size and signal of the hippocampal structures. No focal or acute abnormality is present to correlate with seizure activity. Skullbase is within normal limits. Midline sagittal images are. Degenerative  changes are noted in the upper cervical spine. IMPRESSION: 1. No acute or focal abnormality to account for seizures. 2. Mild white matter changes likely reflect the sequela of chronic microvascular ischemia. Electronically Signed   By: San Morelle M.D.   On: 09/23/2015 07:26   Mr Thoracic Spine Wo Contrast  09/14/2015  CLINICAL DATA:  Initial evaluation for acute back pain EXAM: MRI THORACIC SPINE WITHOUT CONTRAST TECHNIQUE: Multiplanar, multisequence MR imaging of the thoracic spine was performed. No intravenous contrast was administered. COMPARISON:  Prior CT from 09/13/2015. FINDINGS: Limited views of the cervical spine demonstrate multilevel degenerative spondylolysis without severe stenosis. Vertebral bodies are normally aligned with preservation of the normal thoracic kyphosis. Chronic anterior wedging deformity of the T6 vertebral body with without significant retropulsion. There is abnormal T1 hypo intense, T2/STIR hyperintense signal intensity within the  T8 vertebral body, consistent with acute compression fracture. Mild 30% of central height loss without bony retropulsion. No other acute or subacute appearing compression fracture identified. No listhesis or malalignment. Signal intensity within the vertebral body bone marrow within normal limits. Small subcentimeter lesion with the T11 vertebral body noted, indeterminate, but may reflect a small hemangioma. Signal intensity within the visualized thoracic cord is normal. Please note that the distal cord is not visualized on this exam. No acute paraspinous soft tissue abnormality. Small layering bilateral pleural effusions noted. Heavy atheromatous plaque within the visualized aorta. Intraluminal fluid noted within the visualized esophagus . T1-2: Mild disc bulge and posterior element hypertrophy. No significant stenosis. T2-3: Mild disc bulge and posterior element hypertrophy. No significant stenosis. T3-4:  Mild broad-based disc bulge without  significant stenosis. T4-5:  Minimal disc bulge without stenosis. T5-6:  Negative. T6-7: Mild bony retropulsion related to the chronic T6 vertebral body fracture. Superimposed tiny central disc protrusion. Minimal cord flattening without cord signal changes. Mild posterior element hypertrophy. Very mild canal stenosis. T7-T8:  Negative. T8-9:  Mild posterior element hypertrophy without stenosis. T9-10: Mild posterior element hypertrophy without stenosis. No significant disc bulge. T10-11: Posterior element hypertrophy without stenosis. No significant disc bulge. T11-12: Bilateral facet arthrosis without significant stenosis. No significant disc bulge. T12-L1: Seen only on sagittal projection. Disc bulge with reactive endplate changes. No significant stenosis. IMPRESSION: 1. Acute compression fracture involving the T8 vertebral body with associated mild 30% height loss without bony retropulsion. 2. No other acute abnormality within the thoracic spine. 3. Chronic compression deformity of T6 with resultant mild canal stenosis. 4. 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 Please see above report for a full description these findings. 5. Small layering bilateral pleural effusions. Electronically Signed   By: Jeannine Boga M.D.   On: 09/14/2015 22:42   [place] Extrem Up Right Ltd  09/07/2015  [facility] US performed of: Right This study was ordered, performed, and interpreted by Charlann Boxer D.O. Shoulder:  Supraspinatus: Degenerative changes noted but no true acute tear. Patient does have bursal bulge. Underlying arthritic changes noted Infraspinatus: Appears normal on long and transverse views. Significant increase in Doppler flow Subscapularis: Degenerative changes noted. Positive bursa Teres Minor: Appears normal on long and transverse views. AC joint: Moderate arthritis Glenohumeral Joint: Moderate arthritis. Glenoid Labrum: Intact without visualized tears. Biceps Tendon: Appears  normal on long and transverse views, no fraying of tendon, tendon located in intertubercular groove, no subluxation with shoulder internal or external rotation. Impression: Subacromial bursitis, moderate to severe underlying arthritic changes. Procedure: Real-time Ultrasound Guided Injection of right glenohumeral joint Device: GE Logiq E  Ultrasound guided injection is preferred based studies that show increased duration, increased effect, greater accuracy, decreased procedural pain, increased response rate with ultrasound guided versus blind injection.  Verbal informed consent obtained.  Time-out conducted.  Noted no overlying erythema, induration, or other signs of local infection.  Skin prepped in a sterile fashion.  Local anesthesia: Topical Ethyl chloride.  With sterile technique and under real time ultrasound guidance: Joint visualized. 23g 1  inch needle inserted posterior approach. Pictures taken for needle placement. Patient did have injection of 2 cc of 1% lidocaine, 2 cc of 0.5% Marcaine, and 1.0 cc of Kenalog 40 mg/dL. Completed without difficulty  Pain immediately resolved suggesting accurate placement of the medication.  Advised to call if fevers/chills, erythema, induration, drainage, or persistent bleeding.  Images permanently stored and available for review in the ultrasound unit.  Impression: Technically successful ultrasound  guided injection.  Dg Chest Port 1 View  09/22/2015  CLINICAL DATA:  Respiratory failure. EXAM: PORTABLE CHEST 1 VIEW COMPARISON:  09/21/2015. FINDINGS: Mediastinum hilar structures normal. Stable cardiomegaly. Interim near complete clearing of pulmonary interstitial edema. No focal infiltrate. Small left pleural effusion. No pneumothorax. IMPRESSION: Interim near complete clearing of pulmonary interstitial edema. Small left pleural effusion. Electronically Signed   By: Marcello Moores  Register   On: 09/22/2015 07:11   Dg Chest Port 1 View  09/21/2015  CLINICAL DATA:   Acute on chronic respiratory failure with hypoxia and hypercapnia. COPD. EXAM: PORTABLE CHEST 1 VIEW COMPARISON:  Radiographs dated 09/20/2015, 09/16/2015 and 06/23/2015 and CT scan dated 09/13/2015 FINDINGS: There is persistent accentuation of the pulmonary vascularity and interstitial markings consistent with pulmonary edema superimposed on COPD. There is tortuosity and calcification of the thoracic aorta. No appreciable effusions. The osteopenia. No acute bone abnormality. IMPRESSION: Persistent interstitial edema superimposed on COPD. Electronically Signed   By: Lorriane Shire M.D.   On: 09/21/2015 08:15   Dg Chest Port 1 View  09/20/2015  CLINICAL DATA:  Shortness of breath and COPD. EXAM: PORTABLE CHEST 1 VIEW COMPARISON:  09/16/2015 FINDINGS: Cardiomegaly and diffuse bilateral interstitial opacities again noted. Left lower lung opacities have decreased. There is no evidence of pneumothorax. No other changes identified. IMPRESSION: Decreased left lower lung opacities, otherwise unchanged appearance chest. Electronically Signed   By: Margarette Canada M.D.   On: 09/20/2015 07:34   Dg Chest Port 1 View  09/16/2015  CLINICAL DATA:  Acute onset of shortness of breath and decreased O2 saturation. Initial encounter. EXAM: PORTABLE CHEST 1 VIEW COMPARISON:  Chest radiograph and CTA of the chest performed 09/13/2015 FINDINGS: New left basilar airspace opacity is concerning for pneumonia. Increased vascular congestion is noted, and superimposed interstitial edema cannot be excluded. No definite pleural effusion or pneumothorax is seen. The cardiomediastinal silhouette is borderline enlarged. No acute osseous abnormalities are identified. IMPRESSION: New left basilar airspace opacity is concerning for pneumonia. Increased vascular congestion noted, with borderline cardiomegaly. Superimposed interstitial edema cannot be excluded. Electronically Signed   By: Garald Balding M.D.   On: 09/16/2015 04:44   Dg Chest Portable  1 View  09/13/2015  CLINICAL DATA:  Shortness of breath, bilateral lower extremity edema EXAM: PORTABLE CHEST 1 VIEW COMPARISON:  06/23/2015 FINDINGS: Increased interstitial markings, chronic. Mild cephalization after. This appearance suggests chronic compensated CHF without frank interstitial edema. No pleural effusion or pneumothorax. The heart is top-normal in size. IMPRESSION: Chronic interstitial markings.  No frank interstitial edema. No definite pleural effusions. Electronically Signed   By: Julian Hy M.D.   On: 09/13/2015 16:34   Labs: Basic Metabolic Panel:  Recent Labs Lab 09/22/15 0310 09/23/15 2134 09/24/15 0312 09/25/15 0458 09/26/15 0747 09/27/15 0324  NA 141 142 141 140 141  --   K 3.5 3.5 2.8* 3.4* 3.4*  --   CL 71* 85* 86* 87* 91*  --   CO2 >50* 45* 45* 44* 43*  --   GLUCOSE 144* 97 98 92 117*  --   BUN 21* 21* 21* 17 14  --   CREATININE 0.53 0.44 0.43* 0.36* 0.32* 0.35*  CALCIUM 8.8* 8.8* 8.7* 8.8* 8.5*  --   MG  --  1.9 1.9 1.8  --   --   PHOS  --   --  2.4* 2.1*  --   --    CBC:  Recent Labs Lab 09/22/15 0310 09/24/15 0312 09/26/15 0747  WBC 6.2  10.7* 10.6*  HGB 13.2 12.7 12.3  HCT 44.1 41.9 38.2  MCV 102.1* 102.7* 95.0  PLT 242 196 181   Cardiac Enzymes:  Recent Labs Lab 09/25/15 0458 09/25/15 0958  TROPONINI 0.12* 0.11*   BNP: BNP (last 3 results)  Recent Labs  09/13/15 1636 09/14/15 1700 09/16/15 0433  BNP 203.8* 281.8* 629.6*    CBG:  Recent Labs Lab 09/23/15 0112  GLUCAP 104*    Signed:  Barton Dubois MD.  [facility] 09/28/2015, 3:14 PM

## 2015-09-28 NOTE — Progress Notes (Signed)
Daily Progress Note   Patient Name: Jill White       Date: 09/28/2015 DOB: 11/06/1940  Age: 75 y.o. MRN#: [id number] Attending Physician: Barton Dubois, MD Primary Care Physician: Laurey Morale, MD Admit Date: 09/13/2015  Reason for Consultation/Follow-up: Establishing goals of care  Subjective:  patient is awake alert resting in bed, she slept well, confusion much better, she wants to go home.  Interval Events:     Call placed and discussed again with grand daughter Jill White, she states that patient and her discussed again yesterday evening after my family meeting. Patient wants full code and wants to go home with home health care at this time. They state that they fully understand her serious life limiting conditions and might in the near future consider dnr and hospice.  Care manager consult placed.  Patient asking for her peripheral IV to be d/c Not short of breath this am  Length of Stay: 15 days  Current Medications: Scheduled Meds:  . antiseptic oral rinse  7 mL Mouth Rinse q12n4p  . budesonide (PULMICORT) nebulizer solution  0.5 mg Nebulization BID  . chlorhexidine  15 mL Mouth Rinse BID  . diclofenac sodium  2 g Topical QID  . diltiazem  180 mg Oral QHS  . divalproex  250 mg Oral Q12H  . enoxaparin (LOVENOX) injection  40 mg Subcutaneous Q24H  . feeding supplement (ENSURE ENLIVE)  237 mL Oral BID BM  . ipratropium-albuterol  3 mL Nebulization TID  . levETIRAcetam  1,500 mg Oral BID  . Living Better with Heart Failure Book   Does not apply Once  . methylPREDNISolone (SOLU-MEDROL) injection  40 mg Intravenous Daily  . pantoprazole  40 mg Oral Daily  . potassium chloride  20 mEq Oral Daily  . QUEtiapine  12.5 mg Oral QHS  . sodium chloride flush  3 mL Intravenous Q12H     Continuous Infusions:    PRN Meds: acetaminophen **OR** acetaminophen, albuterol, hydrALAZINE, nitroGLYCERIN, ondansetron **OR** ondansetron (ZOFRAN) IV, prochlorperazine  Physical Exam: Physical Exam             Weak frail elderly lady sitting by the edge of the bed In no distress currently S1 S2 Clear anteriorly Abdomen soft No edema Awake alert oriented.   Vital Signs: BP 110/70 mmHg  Pulse 85  Temp(Src) 98 F (36.7 C) (Oral)  Resp 22  Ht 5\' 1"  (1.549 m)  Wt 48.6 kg (107 lb 2.3 oz)  BMI 20.26 kg/m2  SpO2 90% SpO2: SpO2: 90 % O2 Device: O2 Device: Nasal Cannula O2 Flow Rate: O2 Flow Rate (L/min): 3 L/min  Intake/output summary:   Intake/Output Summary (Last 24 hours) at 09/28/15 [id number] Last data filed at 09/28/15 [id number]  Gross per 24 hour  Intake    480 ml  Output    425 ml  Net     55 ml   LBM: Last BM Date: 09/26/15 Baseline Weight: Weight: 50.032 kg (110 lb 4.8 oz) Most recent weight: Weight: 48.6 kg (107 lb 2.3 oz)       Palliative Assessment/Data: Flowsheet Rows        Most Recent Value   Intake Tab    Referral Department  Hospitalist   Unit at Time of Referral  ICU   Palliative Care Primary Diagnosis  Cardiac   Date Notified  09/25/15   Palliative Care Type  Return patient Palliative Care   Reason for referral  Clarify Goals of Care   Date of Admission  09/13/15   Date first seen by Palliative Care  09/26/15   # of days IP prior to Palliative referral  12   Clinical Assessment    Palliative Performance Scale Score  40%   Pain Max last 24 hours  4   Pain Min Last 24 hours  3   Dyspnea Max Last 24 Hours  4   Dyspnea Min Last 24 hours  3   Psychosocial & Spiritual Assessment    Palliative Care Outcomes    Patient/Family meeting held?  Yes   Who was at the meeting?  grand daughter Jill White and the patient.    Palliative Care Outcomes  Clarified goals of care   Palliative Care follow-up planned  No      Additional Data Reviewed: CBC     Component Value Date/Time   WBC 10.6* 09/26/2015 0747   RBC 4.02 09/26/2015 0747   HGB 12.3 09/26/2015 0747   HCT 38.2 09/26/2015 0747   PLT 181 09/26/2015 0747   MCV 95.0 09/26/2015 0747   MCH 30.6 09/26/2015 0747   MCHC 32.2 09/26/2015 0747   RDW 13.8 09/26/2015 0747   LYMPHSABS 1.1 09/16/2015 0433   MONOABS 0.9 09/16/2015 0433   EOSABS 0.0 09/16/2015 0433   BASOSABS 0.0 09/16/2015 0433    CMP     Component Value Date/Time   NA 141 09/26/2015 0747   K 3.4* 09/26/2015 0747   CL 91* 09/26/2015 0747   CO2 43* 09/26/2015 0747   GLUCOSE 117* 09/26/2015 0747   BUN 14 09/26/2015 0747   CREATININE 0.35* 09/27/2015 0324   CALCIUM 8.5* 09/26/2015 0747   PROT 7.1 09/13/2015 1636   ALBUMIN 4.0 09/13/2015 1636   AST 21 09/13/2015 1636   ALT 20 09/13/2015 1636   ALKPHOS 65 09/13/2015 1636   BILITOT 1.1 09/13/2015 1636   GFRNONAA >60 09/27/2015 0324   GFRAA >60 09/27/2015 0324       Problem List:  Patient Active Problem List   Diagnosis Date Noted  . Encounter for palliative care   . Goals of care, counseling/discussion   . Seizure ([place])   . Acute delirium   . Protein-calorie malnutrition, severe 09/22/2015  . Seizures ([place])   . Acute on chronic respiratory failure with hypoxia and hypercapnia (HCC)   . Encephalopathy acute   . (  HFpEF) heart failure with preserved ejection fraction ([place])   . HCAP (healthcare-associated pneumonia) 09/16/2015  . Chronic obstructive pulmonary disease ([place])   . SOB (shortness of breath)   . Acute on chronic respiratory failure ([place]) 09/13/2015  . Acute respiratory failure ([place]) 09/13/2015  . Arthritis of right shoulder region 09/03/2015  . Chronic obstructive pulmonary disease, unspecified COPD, unspecified chronic bronchitis type 05/18/2015  . Acute sinusitis 05/18/2015  . History of panic attacks 02/19/2015  . 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 Cachexia ([place]) 07/23/2014  . Smoking  07/23/2014  . Chronic respiratory failure with hypercapnia ([place]) 07/12/2014  . CN (constipation)   . Restless leg syndrome   . Palliative care encounter   . Compression fracture of thoracic spine, non-traumatic ([place])   . Back pain 05/12/2014  . Chest pain 05/12/2014  . Compression deformity of vertebra 05/12/2014  . LBBB (left bundle branch block) 05/12/2014  . Thoracic spine fracture ([place]) 05/12/2014  . Non-traumatic compression fracture of T6 thoracic vertebra 05/12/2014  . 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 COLD (chronic obstructive lung disease) ([place]) 01/24/2014  . Mild aortic stenosis 11/27/2013  . CAP (community acquired pneumonia) 04/29/2013  . Dehydration with hyponatremia 06/15/2012  . COPD exacerbation ([place]) 06/15/2012  . Duodenal ulcer 07/12/2011  . Hyponatremia 07/06/2011  . Abdominal pain 07/06/2011  . RLS (restless legs syndrome) 07/06/2011  . Cigarette smoker 07/06/2011  . COPD (chronic obstructive pulmonary disease) ([place]) 07/06/2011  . HIP PAIN 03/06/2007  . Essential hypertension 02/09/2007     Palliative Care Assessment & Plan    1.Code Status:  Full code    Code Status Orders        Start     Ordered   09/13/15 2152  Full code   Continuous     09/13/15 2152    Code Status History    Date Active Date Inactive Code Status Order ID Comments User Context   02/01/2015 10:26 PM 02/06/2015  7:40 PM Full Code [id number]  Etta Quill, DO ED   05/12/2014  7:57 PM 05/22/2014  7:22 PM Full Code [id number]  Waldemar Dickens, MD ED   12/02/2013  3:09 PM 12/03/2013  7:19 PM Full Code [id number]  [name] Mocha, MD Inpatient   12/01/2013  9:04 AM 12/02/2013  3:09 PM Full Code [id number]  Jacolyn Reedy, MD Inpatient   11/28/2013  9:20 AM 12/01/2013  9:04 AM Partial Code [id number]  Marijean Heath, NP Inpatient   11/23/2013  2:39 AM 11/28/2013  9:20 AM Full Code [id number]  Samella Parr, NP Inpatient   11/22/2013  3:07 PM 11/23/2013  2:39  AM DNR [id number]  Mendel Corning, MD ED   04/29/2013  5:17 PM 05/03/2013  6:15 PM Full Code [id number]  Eugenie Filler, MD Inpatient   06/15/2012  6:12 PM 06/19/2012  5:07 PM Full Code [id number]  Bynum Bellows, MD Inpatient   07/07/2011  2:08 AM 07/12/2011  8:21 PM Full Code [id number]  Marisa Cyphers, RN ED       2. Goals of Care/Additional Recommendations:  Limitations on Scope of Treatment: Full Scope Treatment  Desire for further Chaplaincy support:no  Psycho-social Needs: Education on Hospice  3. Symptom Management:      1. Continue current mode of care.   4. Palliative Prophylaxis:   Delirium Protocol  5. Prognosis: Unable to determine but appears guarded given serious underlying illness of copd  and aortic stenosis  6. Discharge Planning:  Home with Home health care soon.    Care plan was discussed with  Patient, grand daughter Jill White (over the phone).   Thank you for allowing the Palliative Medicine Team to assist in the care of this patient.   Time In: 845 Time Out: 0910 Total Time 25 Prolonged Time Billed  no        [phone]  Loistine Chance, MD  09/28/2015, 9:09 AM  Please contact Palliative Medicine Team phone at [phone] for questions and concerns.

## 2015-09-28 NOTE — Progress Notes (Signed)
RT came to administer morning neb treatments and patient refused at this time. Patient is resting comfortably and no distress noted at this time.

## 2015-09-28 NOTE — Progress Notes (Signed)
RT came to administer 2 pm neb treatment and patient's daughter stated patient did not want anymore breathing treatments.

## 2015-09-30 ENCOUNTER — Telehealth: Payer: Self-pay | Admitting: *Deleted

## 2015-09-30 NOTE — Telephone Encounter (Signed)
I spoke with the granddaughter and she declined an appointment for TCM with Dr Sarajane Jews.  They are going to pulmonology, neurology and cardiology for follow up hospital care. FYI.

## 2015-10-01 ENCOUNTER — Ambulatory Visit: Payer: Medicare Other | Admitting: Family Medicine

## 2015-10-02 ENCOUNTER — Encounter (HOSPITAL_COMMUNITY): Payer: Self-pay

## 2015-10-02 ENCOUNTER — Emergency Department (HOSPITAL_COMMUNITY): Payer: Medicare Other

## 2015-10-02 ENCOUNTER — Inpatient Hospital Stay (HOSPITAL_COMMUNITY)
Admission: EM | Admit: 2015-10-02 | Discharge: 2015-10-08 | DRG: 291 | Disposition: A | Discharge status: Hospice | Payer: Medicare Other | Attending: Internal Medicine | Admitting: Internal Medicine

## 2015-10-02 DIAGNOSIS — I5023 Acute on chronic systolic (congestive) heart failure: Secondary | ICD-10-CM | POA: Diagnosis present

## 2015-10-02 DIAGNOSIS — K5900 Constipation, unspecified: Secondary | ICD-10-CM | POA: Diagnosis present

## 2015-10-02 DIAGNOSIS — G40909 Epilepsy, unspecified, not intractable, without status epilepticus: Secondary | ICD-10-CM | POA: Diagnosis present

## 2015-10-02 DIAGNOSIS — J438 Other emphysema: Secondary | ICD-10-CM | POA: Diagnosis not present

## 2015-10-02 DIAGNOSIS — R0602 Shortness of breath: Secondary | ICD-10-CM | POA: Diagnosis present

## 2015-10-02 DIAGNOSIS — K5901 Slow transit constipation: Secondary | ICD-10-CM | POA: Diagnosis not present

## 2015-10-02 DIAGNOSIS — I5021 Acute systolic (congestive) heart failure: Secondary | ICD-10-CM

## 2015-10-02 DIAGNOSIS — J9622 Acute and chronic respiratory failure with hypercapnia: Secondary | ICD-10-CM | POA: Diagnosis present

## 2015-10-02 DIAGNOSIS — I350 Nonrheumatic aortic (valve) stenosis: Secondary | ICD-10-CM | POA: Diagnosis not present

## 2015-10-02 DIAGNOSIS — F17210 Nicotine dependence, cigarettes, uncomplicated: Secondary | ICD-10-CM | POA: Diagnosis present

## 2015-10-02 DIAGNOSIS — G2581 Restless legs syndrome: Secondary | ICD-10-CM | POA: Diagnosis present

## 2015-10-02 DIAGNOSIS — J9601 Acute respiratory failure with hypoxia: Secondary | ICD-10-CM | POA: Diagnosis not present

## 2015-10-02 DIAGNOSIS — I272 Other secondary pulmonary hypertension: Secondary | ICD-10-CM | POA: Diagnosis present

## 2015-10-02 DIAGNOSIS — Z9981 Dependence on supplemental oxygen: Secondary | ICD-10-CM

## 2015-10-02 DIAGNOSIS — J441 Chronic obstructive pulmonary disease with (acute) exacerbation: Secondary | ICD-10-CM | POA: Diagnosis not present

## 2015-10-02 DIAGNOSIS — I447 Left bundle-branch block, unspecified: Secondary | ICD-10-CM | POA: Diagnosis present

## 2015-10-02 DIAGNOSIS — E876 Hypokalemia: Secondary | ICD-10-CM | POA: Diagnosis present

## 2015-10-02 DIAGNOSIS — Z8711 Personal history of peptic ulcer disease: Secondary | ICD-10-CM | POA: Diagnosis not present

## 2015-10-02 DIAGNOSIS — Z8249 Family history of ischemic heart disease and other diseases of the circulatory system: Secondary | ICD-10-CM | POA: Diagnosis not present

## 2015-10-02 DIAGNOSIS — R000 Tachycardia, unspecified: Secondary | ICD-10-CM | POA: Diagnosis present

## 2015-10-02 DIAGNOSIS — J9602 Acute respiratory failure with hypercapnia: Secondary | ICD-10-CM

## 2015-10-02 DIAGNOSIS — R410 Disorientation, unspecified: Secondary | ICD-10-CM | POA: Diagnosis not present

## 2015-10-02 DIAGNOSIS — J9811 Atelectasis: Secondary | ICD-10-CM | POA: Diagnosis present

## 2015-10-02 DIAGNOSIS — J9621 Acute and chronic respiratory failure with hypoxia: Secondary | ICD-10-CM | POA: Diagnosis present

## 2015-10-02 DIAGNOSIS — J449 Chronic obstructive pulmonary disease, unspecified: Secondary | ICD-10-CM | POA: Diagnosis present

## 2015-10-02 DIAGNOSIS — Z7189 Other specified counseling: Secondary | ICD-10-CM

## 2015-10-02 DIAGNOSIS — G9340 Encephalopathy, unspecified: Secondary | ICD-10-CM | POA: Diagnosis present

## 2015-10-02 DIAGNOSIS — G9341 Metabolic encephalopathy: Secondary | ICD-10-CM | POA: Diagnosis present

## 2015-10-02 DIAGNOSIS — Z515 Encounter for palliative care: Secondary | ICD-10-CM | POA: Diagnosis not present

## 2015-10-02 DIAGNOSIS — Z7982 Long term (current) use of aspirin: Secondary | ICD-10-CM | POA: Diagnosis not present

## 2015-10-02 DIAGNOSIS — R609 Edema, unspecified: Secondary | ICD-10-CM

## 2015-10-02 DIAGNOSIS — I10 Essential (primary) hypertension: Secondary | ICD-10-CM | POA: Diagnosis present

## 2015-10-02 DIAGNOSIS — Z720 Tobacco use: Secondary | ICD-10-CM | POA: Diagnosis not present

## 2015-10-02 DIAGNOSIS — I110 Hypertensive heart disease with heart failure: Secondary | ICD-10-CM | POA: Diagnosis present

## 2015-10-02 LAB — COMPREHENSIVE METABOLIC PANEL
ALT: 69 U/L — AB (ref 14–54)
AST: 26 U/L (ref 15–41)
Albumin: 3.4 g/dL — ABNORMAL LOW (ref 3.5–5.0)
Alkaline Phosphatase: 64 U/L (ref 38–126)
Anion gap: 11 (ref 5–15)
BUN: 12 mg/dL (ref 6–20)
CHLORIDE: 73 mmol/L — AB (ref 101–111)
CO2: 49 mmol/L — AB (ref 22–32)
CREATININE: 0.47 mg/dL (ref 0.44–1.00)
Calcium: 7.9 mg/dL — ABNORMAL LOW (ref 8.9–10.3)
GFR calc Af Amer: >60 mL/min (ref >60)
Glucose, Bld: 129 mg/dL — ABNORMAL HIGH (ref 65–99)
Potassium: 3.6 mmol/L (ref 3.5–5.1)
Sodium: 133 mmol/L — ABNORMAL LOW (ref 135–145)
Total Bilirubin: 0.6 mg/dL (ref 0.3–1.2)
Total Protein: 6.1 g/dL — ABNORMAL LOW (ref 6.5–8.1)

## 2015-10-02 LAB — CBC WITH DIFFERENTIAL/PLATELET
BASOS ABS: 0 10*3/uL (ref 0.0–0.1)
BASOS PCT: 0 %
EOS ABS: 0 10*3/uL (ref 0.0–0.7)
EOS PCT: 0 %
HCT: 40.8 % (ref 36.0–46.0)
Hemoglobin: 12.6 g/dL (ref 12.0–15.0)
Lymphocytes Relative: 10 %
Lymphs Abs: 1.5 10*3/uL (ref 0.7–4.0)
MCH: 31.1 pg (ref 26.0–34.0)
MCHC: 30.9 g/dL (ref 30.0–36.0)
MCV: 100.7 fL — ABNORMAL HIGH (ref 78.0–100.0)
Monocytes Absolute: 1.2 10*3/uL — ABNORMAL HIGH (ref 0.1–1.0)
Monocytes Relative: 9 %
NEUTROS ABS: 11.5 10*3/uL — AB (ref 1.7–7.7)
Neutrophils Relative %: 81 %
PLATELETS: 355 10*3/uL (ref 150–400)
RBC: 4.05 MIL/uL (ref 3.87–5.11)
RDW: 14.3 % (ref 11.5–15.5)
WBC: 14.3 10*3/uL — ABNORMAL HIGH (ref 4.0–10.5)

## 2015-10-02 LAB — BLOOD GAS, ARTERIAL
Acid-Base Excess: 29.8 mmol/L — ABNORMAL HIGH (ref 0.0–2.0)
BICARBONATE: 59.4 meq/L — AB (ref 20.0–24.0)
DELIVERY SYSTEMS: POSITIVE
DRAWN BY: 308601
EXPIRATORY PAP: 8
Expiratory PAP: 6
FIO2: 1
FIO2: 0.5
INSPIRATORY PAP: 22
Inspiratory PAP: 16
MODE: POSITIVE
Mode: POSITIVE
O2 Saturation: 99.1 %
O2 Saturation: 96.9 %
PH ART: 7.271 — AB (ref 7.350–7.450)
PO2 ART: 242 mmHg — AB (ref 80.0–100.0)
PO2 ART: 81.4 mmHg (ref 80.0–100.0)
Patient temperature: 98.6
Patient temperature: 98.6
RATE: 12 resp/min
TCO2: 52.1 mmol/L (ref 0–100)
pCO2 arterial: 72.8 mmHg (ref 35.0–45.0)
pH, Arterial: 7.522 — ABNORMAL HIGH (ref 7.350–7.450)

## 2015-10-02 LAB — URINALYSIS, ROUTINE W REFLEX MICROSCOPIC
BILIRUBIN URINE: NEGATIVE
GLUCOSE, UA: NEGATIVE mg/dL
Hgb urine dipstick: NEGATIVE
KETONES UR: NEGATIVE mg/dL
Leukocytes, UA: NEGATIVE
Nitrite: NEGATIVE
PH: 5.5 (ref 5.0–8.0)
Protein, ur: NEGATIVE mg/dL
Specific Gravity, Urine: 1.011 (ref 1.005–1.030)

## 2015-10-02 LAB — I-STAT TROPONIN, ED: TROPONIN I, POC: 0.05 ng/mL (ref 0.00–0.08)

## 2015-10-02 LAB — VALPROIC ACID LEVEL: VALPROIC ACID LVL: 35 ug/mL — AB (ref 50.0–100.0)

## 2015-10-02 LAB — I-STAT CG4 LACTIC ACID, ED
LACTIC ACID, VENOUS: 1.74 mmol/L (ref 0.5–2.0)
LACTIC ACID, VENOUS: 1.12 mmol/L (ref 0.5–2.0)

## 2015-10-02 LAB — BRAIN NATRIURETIC PEPTIDE: B NATRIURETIC PEPTIDE 5: 616 pg/mL — AB (ref 0.0–100.0)

## 2015-10-02 MED ORDER — ASPIRIN EC 81 MG PO TBEC
81.0000 mg | DELAYED_RELEASE_TABLET | ORAL | Status: DC
  Administered 2015-10-04 – 2015-10-08 (×3): 81 mg via ORAL
  Filled 2015-10-02 (×3): qty 1

## 2015-10-02 MED ORDER — ONDANSETRON HCL 4 MG/2ML IJ SOLN
4.0000 mg | Freq: Four times a day (QID) | INTRAMUSCULAR | Status: DC | PRN
  Administered 2015-10-07: 4 mg via INTRAVENOUS
  Filled 2015-10-02: qty 2

## 2015-10-02 MED ORDER — LORAZEPAM 2 MG/ML IJ SOLN
0.5000 mg | Freq: Once | INTRAMUSCULAR | Status: AC
Start: 2015-10-02 — End: 2015-10-02
  Administered 2015-10-02: 0.5 mg via INTRAVENOUS
  Filled 2015-10-02: qty 1

## 2015-10-02 MED ORDER — ALBUTEROL SULFATE (2.5 MG/3ML) 0.083% IN NEBU
2.5000 mg | INHALATION_SOLUTION | Freq: Four times a day (QID) | RESPIRATORY_TRACT | Status: DC
  Administered 2015-10-02: 2.5 mg via RESPIRATORY_TRACT
  Filled 2015-10-02: qty 3

## 2015-10-02 MED ORDER — FUROSEMIDE 10 MG/ML IJ SOLN
80.0000 mg | Freq: Two times a day (BID) | INTRAMUSCULAR | Status: DC
  Administered 2015-10-02 – 2015-10-03 (×2): 80 mg via INTRAVENOUS
  Filled 2015-10-02 (×3): qty 8

## 2015-10-02 MED ORDER — METHYLPREDNISOLONE SODIUM SUCC 125 MG IJ SOLR
125.0000 mg | Freq: Once | INTRAMUSCULAR | Status: AC
  Administered 2015-10-02: 125 mg via INTRAVENOUS
  Filled 2015-10-02: qty 2

## 2015-10-02 MED ORDER — ALBUTEROL SULFATE (2.5 MG/3ML) 0.083% IN NEBU
2.5000 mg | INHALATION_SOLUTION | Freq: Three times a day (TID) | RESPIRATORY_TRACT | Status: DC
  Administered 2015-10-03 – 2015-10-08 (×16): 2.5 mg via RESPIRATORY_TRACT
  Filled 2015-10-02 (×16): qty 3

## 2015-10-02 MED ORDER — ENSURE ENLIVE PO LIQD
237.0000 mL | Freq: Two times a day (BID) | ORAL | Status: DC
  Administered 2015-10-05 – 2015-10-08 (×5): 237 mL via ORAL

## 2015-10-02 MED ORDER — FUROSEMIDE 10 MG/ML IJ SOLN
40.0000 mg | Freq: Once | INTRAMUSCULAR | Status: AC
  Administered 2015-10-02: 40 mg via INTRAVENOUS
  Filled 2015-10-02: qty 4

## 2015-10-02 MED ORDER — MORPHINE SULFATE (PF) 2 MG/ML IV SOLN
0.5000 mg | INTRAVENOUS | Status: DC | PRN
  Administered 2015-10-03 – 2015-10-05 (×13): 0.5 mg via INTRAVENOUS
  Filled 2015-10-02 (×12): qty 1

## 2015-10-02 MED ORDER — SODIUM CHLORIDE 0.9 % IV SOLN
250.0000 mL | INTRAVENOUS | Status: DC | PRN
  Administered 2015-10-02: 250 mL via INTRAVENOUS

## 2015-10-02 MED ORDER — MOMETASONE FURO-FORMOTEROL FUM 100-5 MCG/ACT IN AERO
2.0000 | INHALATION_SPRAY | Freq: Two times a day (BID) | RESPIRATORY_TRACT | Status: DC
  Administered 2015-10-05 – 2015-10-08 (×7): 2 via RESPIRATORY_TRACT
  Filled 2015-10-02: qty 8.8

## 2015-10-02 MED ORDER — METHYLPREDNISOLONE SODIUM SUCC 40 MG IJ SOLR
40.0000 mg | INTRAMUSCULAR | Status: DC
  Administered 2015-10-03 – 2015-10-07 (×5): 40 mg via INTRAVENOUS
  Filled 2015-10-02 (×5): qty 1

## 2015-10-02 MED ORDER — ALBUTEROL (5 MG/ML) CONTINUOUS INHALATION SOLN
10.0000 mg/h | INHALATION_SOLUTION | Freq: Once | RESPIRATORY_TRACT | Status: AC
  Administered 2015-10-02: 10 mg/h via RESPIRATORY_TRACT
  Filled 2015-10-02: qty 20

## 2015-10-02 MED ORDER — ONDANSETRON HCL 4 MG PO TABS
4.0000 mg | ORAL_TABLET | ORAL | Status: DC | PRN
  Administered 2015-10-05: 4 mg via ORAL
  Filled 2015-10-02 (×2): qty 1

## 2015-10-02 MED ORDER — SODIUM CHLORIDE 0.9% FLUSH: 3.0000 mL | INTRAVENOUS | Status: DC | PRN

## 2015-10-02 MED ORDER — POTASSIUM CHLORIDE CRYS ER 20 MEQ PO TBCR: 20.0000 meq | EXTENDED_RELEASE_TABLET | Freq: Every day | ORAL | Status: DC

## 2015-10-02 MED ORDER — SODIUM CHLORIDE 0.9% FLUSH
3.0000 mL | Freq: Two times a day (BID) | INTRAVENOUS | Status: DC
  Administered 2015-10-02 – 2015-10-08 (×8): 3 mL via INTRAVENOUS

## 2015-10-02 MED ORDER — IPRATROPIUM-ALBUTEROL 0.5-2.5 (3) MG/3ML IN SOLN
3.0000 mL | Freq: Once | RESPIRATORY_TRACT | Status: AC
  Administered 2015-10-02: 3 mL via RESPIRATORY_TRACT
  Filled 2015-10-02: qty 3

## 2015-10-02 MED ORDER — ONDANSETRON HCL 4 MG PO TABS
4.0000 mg | ORAL_TABLET | Freq: Four times a day (QID) | ORAL | Status: DC | PRN
  Administered 2015-10-07: 4 mg via ORAL

## 2015-10-02 MED ORDER — VALPROATE SODIUM 500 MG/5ML IV SOLN
250.0000 mg | Freq: Once | INTRAVENOUS | Status: AC
  Administered 2015-10-02: 250 mg via INTRAVENOUS
  Filled 2015-10-02: qty 2.5

## 2015-10-02 MED ORDER — DILTIAZEM HCL ER COATED BEADS 180 MG PO CP24: 180.0000 mg | ORAL_CAPSULE | Freq: Every day | ORAL | Status: DC

## 2015-10-02 MED ORDER — LISINOPRIL 2.5 MG PO TABS
2.5000 mg | ORAL_TABLET | Freq: Every day | ORAL | Status: DC
  Administered 2015-10-05 – 2015-10-07 (×3): 2.5 mg via ORAL
  Filled 2015-10-02 (×4): qty 1

## 2015-10-02 MED ORDER — CLONAZEPAM 0.5 MG PO TABS
0.5000 mg | ORAL_TABLET | Freq: Every day | ORAL | Status: DC
  Administered 2015-10-04 – 2015-10-07 (×4): 0.5 mg via ORAL
  Filled 2015-10-02 (×5): qty 1

## 2015-10-02 MED ORDER — LEVETIRACETAM 500 MG PO TABS: 1500.0000 mg | ORAL_TABLET | Freq: Two times a day (BID) | ORAL | Status: DC

## 2015-10-02 MED ORDER — TIOTROPIUM BROMIDE MONOHYDRATE 18 MCG IN CAPS
18.0000 ug | ORAL_CAPSULE | Freq: Every day | RESPIRATORY_TRACT | Status: DC
  Administered 2015-10-05 – 2015-10-08 (×4): 18 ug via RESPIRATORY_TRACT
  Filled 2015-10-02: qty 5

## 2015-10-02 MED ORDER — SODIUM CHLORIDE 0.9% FLUSH
3.0000 mL | Freq: Two times a day (BID) | INTRAVENOUS | Status: DC
  Administered 2015-10-02: 3 mL via INTRAVENOUS

## 2015-10-02 MED ORDER — MORPHINE SULFATE (PF) 2 MG/ML IV SOLN: 1.0000 mg | INTRAVENOUS | Status: DC | PRN

## 2015-10-02 MED ORDER — SODIUM CHLORIDE 0.9 % IV BOLUS (SEPSIS)
500.0000 mL | Freq: Once | INTRAVENOUS | Status: AC
  Administered 2015-10-02: 500 mL via INTRAVENOUS

## 2015-10-02 MED ORDER — DIPHENHYDRAMINE HCL 50 MG/ML IJ SOLN
12.5000 mg | Freq: Once | INTRAMUSCULAR | Status: AC
  Administered 2015-10-02: 12.5 mg via INTRAVENOUS
  Filled 2015-10-02: qty 1

## 2015-10-02 MED ORDER — DIVALPROEX SODIUM 250 MG PO DR TAB
250.0000 mg | DELAYED_RELEASE_TABLET | Freq: Two times a day (BID) | ORAL | Status: DC
  Filled 2015-10-02 (×3): qty 1

## 2015-10-02 MED ORDER — SODIUM CHLORIDE 0.9 % IV SOLN
1500.0000 mg | Freq: Once | INTRAVENOUS | Status: AC
  Administered 2015-10-02: 1500 mg via INTRAVENOUS
  Filled 2015-10-02: qty 15

## 2015-10-02 MED ORDER — HEPARIN SODIUM (PORCINE) 5000 UNIT/ML IJ SOLN
5000.0000 [IU] | Freq: Three times a day (TID) | INTRAMUSCULAR | Status: DC
  Administered 2015-10-02 – 2015-10-08 (×17): 5000 [IU] via SUBCUTANEOUS
  Filled 2015-10-02 (×18): qty 1

## 2015-10-02 MED ORDER — PANTOPRAZOLE SODIUM 40 MG PO TBEC
40.0000 mg | DELAYED_RELEASE_TABLET | Freq: Every day | ORAL | Status: DC
  Administered 2015-10-04 – 2015-10-08 (×5): 40 mg via ORAL
  Filled 2015-10-02 (×5): qty 1

## 2015-10-02 MED ORDER — IPRATROPIUM BROMIDE 0.02 % IN SOLN
0.5000 mg | Freq: Once | RESPIRATORY_TRACT | Status: AC
  Administered 2015-10-02: 0.5 mg via RESPIRATORY_TRACT
  Filled 2015-10-02: qty 2.5

## 2015-10-02 NOTE — Progress Notes (Signed)
Patient granddaughter who is says she is the patient's POA is in the room. Granddaughter removed restraints to reposition pt. Granddaughter educated about Environmental manager. Granddaughter states she is aware and will monitor patient without restraints herself. Family is aware that restraints will continue after visit.

## 2015-10-02 NOTE — Progress Notes (Signed)
Pt is admited to 1236 form ED. Family at the bedside and patient is on restraint.

## 2015-10-02 NOTE — ED Provider Notes (Signed)
CSN: [id number]     Arrival date & time 10/02/15  1208 History   First MD Initiated Contact with Patient 10/02/15 1218     Chief Complaint  Patient presents with  . Respiratory Distress     (Consider location/radiation/quality/duration/timing/severity/associated sxs/prior Treatment) HPI 75 year old female who presents with respiratory distress. History of COPD on 2 L home oxygen, chronic diastolic heart failure, moderate to severe aortic stenosis, seizure disorder on Keppra and Depakote. Recently discharged from the hospital 4 days ago for acute respiratory failure in the setting of COPD exacerbation. Lives at home with family who states that over the past 24 hours she has become altered and confused. Has been having increased work of breathing and shortness of breath with nonproductive cough. No falls or trauma, fevers or chills, complaints of pain. Family has noted some increased pedal edema over the course of the past day as well. EMS was called and on their arrival she was hypoxic to 60% on supplemental oxygen. Place on CPAP and transferred to ED. Past Medical History  Diagnosis Date  . HIP PAIN   . HYPERTENSION   . Restless leg syndrome   . COPD (chronic obstructive pulmonary disease) (HCC)     a. Multiple prior admissions for acute hypoxic respiratory failure in setting of COPD exacerbations +/- pneumonia.  . Peptic ulcer disease     a. 2013: gastric antrum.  Marland Kitchen Hyponatremia   . Tobacco abuse   . Hypomagnesemia   . LBBB (left bundle branch block)     a. Intermittent, likely rate related.  . Pulmonary HTN ([place])     a. Elevated PA pressure by prior echoes.  . Mitral regurgitation     a. Mild MR by echo 2014, not mentioned on 11/2013 (severely calcified annulus).  Marland Kitchen Respiratory failure ([place])     a. Adm 11/2013 with severe hypercarbic and hypoxemic respiratory failure requiring intubation.   . Abnormal echocardiogram     a. 11/2013: decreased EF 45-50%, moderate AS- > subsequent cath  12/02/13 with mildly calcified cors without obstruction, mild AS, EF 60%, mild pulm HTN.  Marland Kitchen Aortic stenosis     a. 11/2013: mod by echo, then mild by cath.  . Pulmonary HTN ([place])     a. 11/2013: mild by cath.  . Wide-complex tachycardia ([place])     a. Felt likely atrial tach with rate-related LBBB.  Marland Kitchen Chronic diastolic CHF (congestive heart failure) ([place])   . Diverticulitis of colon (without mention of hemorrhage) 08/09/2013  . Septic shock ([place]) 12/11/2013  . Acute on chronic combined systolic and diastolic congestive heart failure, NYHA class 4 ([place]) 11/27/2013   Past Surgical History  Procedure Laterality Date  . Abdominal hysterectomy      vaginal hysterectomy  . Dilation and curettage of uterus    . Tonsillectomy    . Esophagogastroduodenoscopy  07/09/2011    Procedure: ESOPHAGOGASTRODUODENOSCOPY (EGD);  Surgeon: Missy Sabins, MD;  Location: [facility];  Service: Endoscopy;  Laterality: N/A;  patient in room 1532  . Cataract extraction w/ intraocular lens  implant, bilateral Bilateral 03/2013    Patient claims it was within the month.   . Left and right heart catheterization with coronary angiogram N/A 12/02/2013    Procedure: LEFT AND RIGHT HEART CATHETERIZATION WITH CORONARY ANGIOGRAM;  Surgeon: Blane Ohara, MD;  Location: [facility] CATH LAB;  Service: Cardiovascular;  Laterality: N/A;  . Laparoscopic sigmoid colectomy  07-03-14    per Dr. Malachi Carl in [facility]  History  Problem Relation Age of Onset  . Heart disease Sister   . Heart disease Brother   . Dementia Mother   . Anemia Father    Social History  Substance Use Topics  . Smoking status: Current Every Day Smoker -- 50 years    Types: Cigarettes  . Smokeless tobacco: Never Used     Comment: 1/2 pack per day  . Alcohol Use: No   OB History    No data available     Review of Systems  Unable to perform ROS: Mental status change      Allergies  Tetanus toxoid; Penicillins; Ativan; Codeine;  Erythromycin; Gabapentin; Levaquin; Meloxicam; Methylprednisolone; and Prednisone  Home Medications   Prior to Admission medications   Medication Sig Start Date End Date Taking? Authorizing Provider  aspirin 81 MG tablet Take 81 mg by mouth every other day.    Yes Historical Provider, MD  clonazePAM (KLONOPIN) 1 MG tablet Take 0.5 tablets (0.5 mg total) by mouth at bedtime. 09/28/15  Yes Barton Dubois, MD  diltiazem (CARDIZEM CD) 180 MG 24 hr capsule Take 1 capsule (180 mg total) by mouth at bedtime. 09/28/15  Yes Barton Dubois, MD  divalproex (DEPAKOTE) 250 MG DR tablet Take 1 tablet (250 mg total) by mouth every 12 (twelve) hours. 09/28/15  Yes Barton Dubois, MD  feeding supplement, ENSURE ENLIVE, (ENSURE ENLIVE) LIQD Take 237 mLs by mouth 2 (two) times daily between meals. 09/28/15  Yes Barton Dubois, MD  furosemide (LASIX) 20 MG tablet Take 2 tablets (40 mg total) by mouth daily. 09/28/15  Yes Barton Dubois, MD  levETIRAcetam (KEPPRA) 750 MG tablet Take 2 tablets (1,500 mg total) by mouth 2 (two) times daily. 09/28/15  Yes Barton Dubois, MD  ondansetron (ZOFRAN) 4 MG tablet Take 4 mg by mouth every 4 (four) hours as needed for nausea or vomiting.   Yes Historical Provider, MD  pantoprazole (PROTONIX) 40 MG tablet Take 1 tablet (40 mg total) by mouth daily. X 2 wks 09/28/15  Yes Barton Dubois, MD  potassium chloride SA (K-DUR,KLOR-CON) 20 MEQ tablet Take 1 tablet (20 mEq total) by mouth daily. 09/28/15  Yes Barton Dubois, MD  predniSONE (DELTASONE) 20 MG tablet Take 1 tablet by mouth daily  For 3 days; then 1/2 tablet by mouth daily X 3 days and stop prednisone 09/28/15  Yes Barton Dubois, MD  PROAIR HFA 108 (90 BASE) MCG/ACT inhaler INHALE TWO PUFFS BY MOUTH EVERY FOUR HOURS AS NEEDED Patient taking differently: INHALE TWO PUFFS BY MOUTH EVERY FOUR HOURS AS NEEDED shortness of breath/wheezing 10/31/14  Yes Laurey Morale, MD  SYMBICORT 80-4.5 MCG/ACT inhaler INHAKLE 2 PUFFS INTO THE LUNGS TWICE DAILY  09/21/15  Yes Brand Males, MD  tiotropium (SPIRIVA) 18 MCG inhalation capsule Place 1 capsule (18 mcg total) into inhaler and inhale daily. 12/03/13  Yes Erick Colace, NP  Respiratory Therapy Supplies (FLUTTER) DEVI Use as directed 01/26/15   Tanda Rockers, MD   BP 132/86 mmHg  Pulse 90  Temp(Src) 98.9 F (37.2 C) (Rectal)  Resp 18  SpO2 96% Physical Exam Physical Exam  Nursing note and vitals reviewed. Constitutional: altered, lethargic, arouses to touch and voice Head: Normocephalic and atraumatic.  Mouth/Throat: Oropharynx is clear and moist.  Neck: Normal range of motion. Neck supple.  Cardiovascular: Normal rate and regular rhythm.   Pulmonary/Chest: Effort normal and breath sounds normal.  Abdominal: Soft. There is no tenderness. There is no rebound and no guarding.  Musculoskeletal:  Normal range of motion. +2 pedal edema Neurological: lethargic, arouses to voice and tactile stimulation, short yes and no answers to questions, moves all extremities symmetrically Skin: Skin is warm and dry.  Psychiatric: Cooperative  ED Course  Procedures (including critical care time) Labs Review Labs Reviewed  COMPREHENSIVE METABOLIC PANEL - Abnormal; Notable for the following:    Sodium 133 (*)    Chloride 73 (*)    CO2 49 (*)    Glucose, Bld 129 (*)    Calcium 7.9 (*)    Total Protein 6.1 (*)    Albumin 3.4 (*)    ALT 69 (*)    All other components within normal limits  CBC WITH DIFFERENTIAL/PLATELET - Abnormal; Notable for the following:    WBC 14.3 (*)    MCV 100.7 (*)    Neutro Abs 11.5 (*)    Monocytes Absolute 1.2 (*)    All other components within normal limits  BLOOD GAS, ARTERIAL - Abnormal; Notable for the following:    pH, Arterial 7.271 (*)    pO2, Arterial 242 (*)    All other components within normal limits  BRAIN NATRIURETIC PEPTIDE - Abnormal; Notable for the following:    B Natriuretic Peptide 616.0 (*)    All other components within normal limits   VALPROIC ACID LEVEL - Abnormal; Notable for the following:    Valproic Acid Lvl 35 (*)    All other components within normal limits  BLOOD GAS, ARTERIAL - Abnormal; Notable for the following:    pH, Arterial 7.300 (*)    pO2, Arterial 61.9 (*)    All other components within normal limits  CULTURE, BLOOD (ROUTINE X 2)  CULTURE, BLOOD (ROUTINE X 2)  URINE CULTURE  URINALYSIS, ROUTINE W REFLEX MICROSCOPIC (NOT AT [facility])  I-STAT CG4 LACTIC ACID, ED  I-STAT TROPOININ, ED  I-STAT CG4 LACTIC ACID, ED    Imaging Review Ct Head Wo Contrast  10/02/2015  CLINICAL DATA:  75 year old female with increasing shortness of breath and confusion for 2 days. Wheezing and rales. EXAM: CT HEAD WITHOUT CONTRAST TECHNIQUE: Contiguous axial images were obtained from the base of the skull through the vertex without intravenous contrast. COMPARISON:  Brain MRI 09/23/2015. FINDINGS: Patchy areas of mild decreased attenuation are noted throughout the deep and periventricular white matter of the cerebral hemispheres bilaterally, compatible with mild chronic microvascular ischemic disease. No acute intracranial abnormalities. Specifically, no evidence of acute intracranial hemorrhage, no definite findings of acute/subacute cerebral ischemia, no mass, mass effect, hydrocephalus or abnormal intra or extra-axial fluid collections. Visualized paranasal sinuses and mastoids are well pneumatized, with exception of a small air-fluid level in dependent portion of the left maxillary sinus (which is incompletely visualized). No acute displaced skull fractures are identified. IMPRESSION: 1. No acute intracranial abnormalities. 2. Mild chronic microvascular ischemic changes in the cerebral white matter. 3. Small air-fluid level in the left maxillary sinus which is incompletely visualized. Clinical correlation for signs and symptoms of sinusitis is suggested. Electronically Signed   By: Vinnie Langton M.D.   On: 10/02/2015 14:41   Dg  Chest Portable 1 View  10/02/2015  CLINICAL DATA:  Increasing shortness of breath for 2 days, hypertension, COPD, CHF, smoker EXAM: PORTABLE CHEST 1 VIEW COMPARISON:  Portable exam 1305 hours compared to 09/22/2015 FINDINGS: Enlargement of cardiac silhouette with pulmonary vascular congestion. Atherosclerotic calcification and tortuosity of thoracic aorta. Emphysematous and chronic bronchitic changes compatible with COPD. Minimal chronic interstitial prominence, stable. No definite infiltrate, pleural effusion  or pneumothorax. Diffuse osseous demineralization. IMPRESSION: Enlargement of cardiac silhouette with pulmonary vascular congestion. Probable changes of COPD and question chronic interstitial lung disease. No acute abnormalities. Electronically Signed   By: Lavonia Sami Froh M.D.   On: 10/02/2015 13:19   I have personally reviewed and evaluated these images and lab results as part of my medical decision-making.   EKG Interpretation None      EKG 09/13/2015 16:08:44 HR 106 QTc 520 QRS 155 Sinus tachycardia with left bundle branch block. No acute ischemic changes.   CRITICAL CARE Performed by: Forde Dandy   Total critical care time: 35 minutes  Critical care time was exclusive of separately billable procedures and treating other patients.  Critical care was necessary to treat or prevent imminent or life-threatening deterioration.  Critical care was time spent personally by me on the following activities: development of treatment plan with patient and/or surrogate as well as nursing, discussions with consultants, evaluation of patient's response to treatment, examination of patient, obtaining history from patient or surrogate, ordering and performing treatments and interventions, ordering and review of laboratory studies, ordering and review of radiographic studies, pulse oximetry and re-evaluation of patient's condition.   MDM   Final diagnoses:  Acute respiratory failure with hypoxia  and hypercapnia (HCC)  COPD exacerbation (HCC)  Interstitial edema    75 year old female who presents with acute hypercapneic and hypoxic respiratory failure. Appears altered but opens eyes to voice, answers simple questions and moves all extremities symmetrically. Continued on BiPAP with improved work of breathing and oxygenation. With hypercapnia, undetectable on ABG with pH 7.27. Repeat ABG after 2 hours with mild improvement of pH 7.30 but persistent undetectable pCO2. Mental status slightly improved. Likely etiology of AMS. CT head negative. CXR with mild interstitial edema in setting of CHF history with increased edema, diuresed with 40 mg of lasix. Continued on breathing treatments and solumedrol for COPD as well. No signs of infection. Discussed with ICU initially, who felt patient stable for stepdown admission. Discussed with Dr. Venetia Constable who will admit to stepdown.    Forde Dandy, MD 10/03/15 1330

## 2015-10-02 NOTE — H&P (Signed)
[facility] History and Physical  Jill White DOB: Feb 27, 1941 DOA: 10/02/2015  Referring physician: Dr. Oleta Mouse PCP: Laurey Morale, MD   Chief Complaint: Confusion and hypoxia  HPI: Jill White is a 75 y.o. female past medical history of severe COPD oxygen dependent, moderate aortic stenosis, seizure disorder recently discharged from the hospital 4 days prior to admission for respiratory failure due to COPD that comes in for confusion and shortness of breath that started on the day of admission. Patient lives with the family and the family is not at bedside as per chart they relate that she has been confused with increase work of breathing and none productive cough.  In the ED: An ABG was done that showed a PCO2 greater than 100 with a pH of 7.27, was placed on BiPAP and repeated ABG shows 7.3/undetectable CO2/61 we are consulted for further evaluation  Review of Systems:  Constitutional:  No weight loss, night sweats, Fevers, chills, fatigue.  HEENT:  No headaches, Difficulty swallowing,Tooth/dental problems,Sore throat,  No sneezing, itching, ear ache, nasal congestion, post nasal drip,  Cardio-vascular:  No chest pain, Orthopnea, PND, swelling in lower extremities, anasarca, dizziness, palpitations  GI:  No heartburn, indigestion, abdominal pain, nausea, vomiting, diarrhea, change in bowel habits, loss of appetite  Resp:   No coughing up of blood.No change in color of mucus.No wheezing.No chest wall deformity  Skin:  no rash or lesions.  GU:  no dysuria, change in color of urine, no urgency or frequency. No flank pain.  Musculoskeletal:  No joint pain or swelling. No decreased range of motion. No back pain.  Psych:  No change in mood or affect. No depression or anxiety. No memory loss.   Past Medical History  Diagnosis Date  . HIP PAIN   . HYPERTENSION   . Restless leg syndrome   . COPD (chronic obstructive pulmonary disease) (HCC)     a. Multiple  prior admissions for acute hypoxic respiratory failure in setting of COPD exacerbations +/- pneumonia.  . Peptic ulcer disease     a. 2013: gastric antrum.  Marland Kitchen Hyponatremia   . Tobacco abuse   . Hypomagnesemia   . LBBB (left bundle branch block)     a. Intermittent, likely rate related.  . Pulmonary HTN ([place])     a. Elevated PA pressure by prior echoes.  . Mitral regurgitation     a. Mild MR by echo 2014, not mentioned on 11/2013 (severely calcified annulus).  Marland Kitchen Respiratory failure ([place])     a. Adm 11/2013 with severe hypercarbic and hypoxemic respiratory failure requiring intubation.   . Abnormal echocardiogram     a. 11/2013: decreased EF 45-50%, moderate AS- > subsequent cath 12/02/13 with mildly calcified cors without obstruction, mild AS, EF 60%, mild pulm HTN.  Marland Kitchen Aortic stenosis     a. 11/2013: mod by echo, then mild by cath.  . Pulmonary HTN ([place])     a. 11/2013: mild by cath.  . Wide-complex tachycardia ([place])     a. Felt likely atrial tach with rate-related LBBB.  Marland Kitchen Chronic diastolic CHF (congestive heart failure) ([place])   . Diverticulitis of colon (without mention of hemorrhage) 08/09/2013  . Septic shock ([place]) 12/11/2013  . Acute on chronic combined systolic and diastolic congestive heart failure, NYHA class 4 ([place]) 11/27/2013   Past Surgical History  Procedure Laterality Date  . Abdominal hysterectomy      vaginal hysterectomy  . Dilation and curettage of uterus    .  Tonsillectomy    . Esophagogastroduodenoscopy  07/09/2011    Procedure: ESOPHAGOGASTRODUODENOSCOPY (EGD);  Surgeon: Missy Sabins, MD;  Location: [facility];  Service: Endoscopy;  Laterality: N/A;  patient in room 1532  . Cataract extraction w/ intraocular lens  implant, bilateral Bilateral 03/2013    Patient claims it was within the month.   . Left and right heart catheterization with coronary angiogram N/A 12/02/2013    Procedure: LEFT AND RIGHT HEART CATHETERIZATION WITH CORONARY ANGIOGRAM;  Surgeon: Blane Ohara, MD;  Location: [facility] CATH LAB;  Service: Cardiovascular;  Laterality: N/A;  . Laparoscopic sigmoid colectomy  07-03-14    per Dr. Malachi Carl in [place] History:  reports that she has been smoking Cigarettes.  She has smoked for the past 50 years. She has never used smokeless tobacco. She reports that she does not drink alcohol or use illicit drugs.  Allergies  Allergen Reactions  . Tetanus Toxoid Anaphylaxis  . Penicillins Hives    Has patient had a PCN reaction causing immediate rash, facial/tongue/throat swelling, SOB or lightheadedness with hypotension: unknown  Has patient had a PCN reaction causing severe rash involving mucus membranes or skin necrosis: No  Has patient had a PCN reaction that required hospitalization: No  Has patient had a PCN reaction occurring within the last 10 years: No  If all of the above answers are "NO", then may proceed with Cephalosporin use.   . Ativan [Lorazepam] Other (See Comments)    "all benzos" -- respiratory failure  . Codeine Nausea And Vomiting and Other (See Comments)    Itching and faint   . Erythromycin Nausea And Vomiting  . Gabapentin     REACTION: severe edema, nausea, dizziness, disorientation  . Levaquin [Levofloxacin In D5w] Other (See Comments)    Joint pain; (states she can take it IV- not by mouth)  . Meloxicam Itching  . Methylprednisolone Hives  . Prednisone Other (See Comments)    GI bleed    Family History  Problem Relation Age of Onset  . Heart disease Sister   . Heart disease Brother   . Dementia Mother   . Anemia Father     Prior to Admission medications   Medication Sig Start Date End Date Taking? Authorizing Provider  aspirin 81 MG tablet Take 81 mg by mouth every other day.    Yes Historical Provider, MD  clonazePAM (KLONOPIN) 1 MG tablet Take 0.5 tablets (0.5 mg total) by mouth at bedtime. 09/28/15  Yes Barton Dubois, MD  diltiazem (CARDIZEM CD) 180 MG 24 hr capsule Take 1 capsule (180  mg total) by mouth at bedtime. 09/28/15  Yes Barton Dubois, MD  divalproex (DEPAKOTE) 250 MG DR tablet Take 1 tablet (250 mg total) by mouth every 12 (twelve) hours. 09/28/15  Yes Barton Dubois, MD  feeding supplement, ENSURE ENLIVE, (ENSURE ENLIVE) LIQD Take 237 mLs by mouth 2 (two) times daily between meals. 09/28/15  Yes Barton Dubois, MD  furosemide (LASIX) 20 MG tablet Take 2 tablets (40 mg total) by mouth daily. 09/28/15  Yes Barton Dubois, MD  levETIRAcetam (KEPPRA) 750 MG tablet Take 2 tablets (1,500 mg total) by mouth 2 (two) times daily. 09/28/15  Yes Barton Dubois, MD  ondansetron (ZOFRAN) 4 MG tablet Take 4 mg by mouth every 4 (four) hours as needed for nausea or vomiting.   Yes Historical Provider, MD  pantoprazole (PROTONIX) 40 MG tablet Take 1 tablet (40 mg total) by mouth daily. X 2 wks  09/28/15  Yes Barton Dubois, MD  potassium chloride SA (K-DUR,KLOR-CON) 20 MEQ tablet Take 1 tablet (20 mEq total) by mouth daily. 09/28/15  Yes Barton Dubois, MD  predniSONE (DELTASONE) 20 MG tablet Take 1 tablet by mouth daily  For 3 days; then 1/2 tablet by mouth daily X 3 days and stop prednisone 09/28/15  Yes Barton Dubois, MD  PROAIR HFA 108 (90 BASE) MCG/ACT inhaler INHALE TWO PUFFS BY MOUTH EVERY FOUR HOURS AS NEEDED Patient taking differently: INHALE TWO PUFFS BY MOUTH EVERY FOUR HOURS AS NEEDED shortness of breath/wheezing 10/31/14  Yes Laurey Morale, MD  SYMBICORT 80-4.5 MCG/ACT inhaler INHAKLE 2 PUFFS INTO THE LUNGS TWICE DAILY 09/21/15  Yes Brand Males, MD  tiotropium (SPIRIVA) 18 MCG inhalation capsule Place 1 capsule (18 mcg total) into inhaler and inhale daily. 12/03/13  Yes Erick Colace, NP  Respiratory Therapy Supplies (FLUTTER) DEVI Use as directed 01/26/15   Tanda Rockers, MD   Physical Exam: Filed Vitals:   10/02/15 1444 10/02/15 1535 10/02/15 1537 10/02/15 1603  BP: 139/75   132/86  Pulse: 67   90  Temp:      TempSrc:      Resp: 16   18  SpO2: 97% 92% 96% 96%    Wt Readings  from Last 3 Encounters:  09/28/15 48.6 kg (107 lb 2.3 oz)  09/14/15 50.349 kg (111 lb)  09/03/15 51.71 kg (114 lb)    General:  Appears calm and comfortable Eyes: PERRL, normal lids, irises & conjunctiva ENT: grossly normal hearing, lips & tongue Neck: no LAD, masses or thyromegaly Cardiovascular: RRR, and aortic systolic murmur I cannot appreciate and S2 she has positive JVD. Telemetry: SR, no arrhythmias  Respiratory: She has good air movement with crackles bilaterally in her lower lobes Abdomen: soft, ntnd Skin: no rash or induration seen on limited exam Musculoskeletal: grossly normal tone BUE/BLE Psychiatric: grossly normal mood and affect, speech fluent and appropriate Neurologic: grossly non-focal.          Labs on Admission:  Basic Metabolic Panel:  Recent Labs Lab 09/26/15 0747 09/27/15 0324 10/02/15 1237  NA 141  --  133*  K 3.4*  --  3.6  CL 91*  --  73*  CO2 43*  --  49*  GLUCOSE 117*  --  129*  BUN 14  --  12  CREATININE 0.32* 0.35* 0.47  CALCIUM 8.5*  --  7.9*   Liver Function Tests:  Recent Labs Lab 10/02/15 1237  AST 26  ALT 69*  ALKPHOS 64  BILITOT 0.6  PROT 6.1*  ALBUMIN 3.4*   No results for input(s): LIPASE, AMYLASE in the last 168 hours. No results for input(s): AMMONIA in the last 168 hours. CBC:  Recent Labs Lab 09/26/15 0747 10/02/15 1237  WBC 10.6* 14.3*  NEUTROABS  --  11.5*  HGB 12.3 12.6  HCT 38.2 40.8  MCV 95.0 100.7*  PLT 181 355   Cardiac Enzymes: No results for input(s): CKTOTAL, CKMB, CKMBINDEX, TROPONINI in the last 168 hours.  BNP (last 3 results)  Recent Labs  09/14/15 1700 09/16/15 0433 10/02/15 1237  BNP 281.8* 629.6* 616.0*    ProBNP (last 3 results) No results for input(s): PROBNP in the last 8760 hours.  CBG: No results for input(s): GLUCAP in the last 168 hours.  Radiological Exams on Admission: Ct Head Wo Contrast  10/02/2015  CLINICAL DATA:  75 year old female with increasing shortness of  breath and confusion for 2 days. Wheezing  and rales. EXAM: CT HEAD WITHOUT CONTRAST TECHNIQUE: Contiguous axial images were obtained from the base of the skull through the vertex without intravenous contrast. COMPARISON:  Brain MRI 09/23/2015. FINDINGS: Patchy areas of mild decreased attenuation are noted throughout the deep and periventricular white matter of the cerebral hemispheres bilaterally, compatible with mild chronic microvascular ischemic disease. No acute intracranial abnormalities. Specifically, no evidence of acute intracranial hemorrhage, no definite findings of acute/subacute cerebral ischemia, no mass, mass effect, hydrocephalus or abnormal intra or extra-axial fluid collections. Visualized paranasal sinuses and mastoids are well pneumatized, with exception of a small air-fluid level in dependent portion of the left maxillary sinus (which is incompletely visualized). No acute displaced skull fractures are identified. IMPRESSION: 1. No acute intracranial abnormalities. 2. Mild chronic microvascular ischemic changes in the cerebral white matter. 3. Small air-fluid level in the left maxillary sinus which is incompletely visualized. Clinical correlation for signs and symptoms of sinusitis is suggested. Electronically Signed   By: Vinnie Langton M.D.   On: 10/02/2015 14:41   Dg Chest Portable 1 View  10/02/2015  CLINICAL DATA:  Increasing shortness of breath for 2 days, hypertension, COPD, CHF, smoker EXAM: PORTABLE CHEST 1 VIEW COMPARISON:  Portable exam 1305 hours compared to 09/22/2015 FINDINGS: Enlargement of cardiac silhouette with pulmonary vascular congestion. Atherosclerotic calcification and tortuosity of thoracic aorta. Emphysematous and chronic bronchitic changes compatible with COPD. Minimal chronic interstitial prominence, stable. No definite infiltrate, pleural effusion or pneumothorax. Diffuse osseous demineralization. IMPRESSION: Enlargement of cardiac silhouette with pulmonary  vascular congestion. Probable changes of COPD and question chronic interstitial lung disease. No acute abnormalities. Electronically Signed   By: Lavonia Dana M.D.   On: 10/02/2015 13:19    EKG: Independently reviewed. none  Assessment/Plan Acute on chronic respiratory failure with hypoxia and hypercapnia (HCC) due to acute systolic heart failure: Patient is in florid volume overload, her BNP is elevated and she has positive JVD. She had a previous 2-D echo that showed moderate aortic stenosis, physical examination a cannot appreciate her S2. He has met with palliativeof care and she still remains a full code. I think we should reconsult palliative care and reevaluate the situation with the family. I'll go ahead and start her on IV Lasix monitor electrolytes place her nothing by mouth keep her on BiPAP and admitted to step down. She is moving good air physical exam will continue inhalers. Start an ACE inhibitor in the morning. I have started her on low-dose steroids with inhalers, will be quick to taper them down.   Encephalopathy acute: Likely due to hypercarbia, she is tolerating her BiPAP lower. An ABG tonight. I expect her encephalopathy to improve once her hypercarbia is resolved.  Aortic stenosis: Moderate need to discuss goals of care with the family.  Essential hypertension: Stable, she has an ejection fraction of 45% we'll go ahead and start an ACE inhibitor in the morning.  Chronic obstructive pulmonary disease (HCC) Continue Spiriva the ED gave her a dose of steroids decrease it to 40 once a day, will be quick to taper them down continue inhalers. There is unlikely a COPD exacerbation, she has JVD crackles in her lungs with elevated BMP moving good air and no wheezing to auscultation.  Code Status: full DVT Prophylaxis:heparin Family Communication: none, as always the family is not at bedside and they are not answering the phone. Disposition Plan: inpatient  Time spent:  65 min  Charlynne Cousins [facility] Pager [phone]

## 2015-10-02 NOTE — Progress Notes (Signed)
Patient noted to have been admitted and discharged from the hospital from 03/26 to 04/10 for respiratory failure.  Patient discharged to home with home health services of [facility] for RN, PT, OT and aide.  Patient wears oxygen at home supplied by [facility]  Patient presents to Ed today with increased shortness of breath while on home oxygen with O2 sat of in the 60's per chart review.  Patient's pcp list is Dr. Sarajane Jews.  EDCM went to speak to patient at bedside, however patient/bed was being changed.

## 2015-10-02 NOTE — ED Notes (Signed)
Patient transported by [facility] from home.  Patient c/o increasing SHOB and confusion x 2days.  EMS states patient was confused and combative upon their arrival with wheezing and rales.  Patient placed on CPAP en route.  Initial O2 sat in 60's prior to EMS arrival.

## 2015-10-02 NOTE — Progress Notes (Signed)
[place] notified Santiago Glad of [facility] of patient's admission.

## 2015-10-02 NOTE — Progress Notes (Signed)
RT attempted pt on VM but Pt's kept desating on mask.  Pt placed back on NIV/PC 10/5, RR 12 and 40%.  Pt tolerating well at this time.  RT to monitor and assess as needed.

## 2015-10-03 DIAGNOSIS — R410 Disorientation, unspecified: Secondary | ICD-10-CM

## 2015-10-03 LAB — BASIC METABOLIC PANEL
BUN: 11 mg/dL (ref 6–20)
CALCIUM: 7.4 mg/dL — AB (ref 8.9–10.3)
CO2: >50 mmol/L — ABNORMAL HIGH (ref 22–32)
Chloride: 71 mmol/L — ABNORMAL LOW (ref 101–111)
Creatinine, Ser: 0.47 mg/dL (ref 0.44–1.00)
GLUCOSE: 114 mg/dL — AB (ref 65–99)
POTASSIUM: 3 mmol/L — AB (ref 3.5–5.1)
Sodium: 139 mmol/L (ref 135–145)

## 2015-10-03 LAB — CBC
HEMATOCRIT: 35.7 % — AB (ref 36.0–46.0)
Hemoglobin: 11.6 g/dL — ABNORMAL LOW (ref 12.0–15.0)
MCH: 31.3 pg (ref 26.0–34.0)
MCHC: 32.5 g/dL (ref 30.0–36.0)
MCV: 96.2 fL (ref 78.0–100.0)
PLATELETS: 347 10*3/uL (ref 150–400)
RBC: 3.71 MIL/uL — AB (ref 3.87–5.11)
RDW: 13.9 % (ref 11.5–15.5)
WBC: 11.5 10*3/uL — AB (ref 4.0–10.5)

## 2015-10-03 MED ORDER — POTASSIUM CHLORIDE 10 MEQ/100ML IV SOLN
INTRAVENOUS | Status: AC
  Filled 2015-10-03: qty 100

## 2015-10-03 MED ORDER — POTASSIUM CHLORIDE 10 MEQ/100ML IV SOLN
10.0000 meq | INTRAVENOUS | Status: AC
  Administered 2015-10-03 (×4): 10 meq via INTRAVENOUS
  Filled 2015-10-03 (×2): qty 100

## 2015-10-03 MED ORDER — POTASSIUM CHLORIDE CRYS ER 20 MEQ PO TBCR: 60.0000 meq | EXTENDED_RELEASE_TABLET | Freq: Four times a day (QID) | ORAL | Status: DC

## 2015-10-03 MED ORDER — CARVEDILOL 6.25 MG PO TABS
6.2500 mg | ORAL_TABLET | Freq: Two times a day (BID) | ORAL | Status: DC
  Administered 2015-10-05 – 2015-10-07 (×6): 6.25 mg via ORAL
  Filled 2015-10-03 (×7): qty 1

## 2015-10-03 MED ORDER — SODIUM CHLORIDE 0.9 % IV SOLN
1500.0000 mg | Freq: Two times a day (BID) | INTRAVENOUS | Status: DC
  Administered 2015-10-03 – 2015-10-05 (×6): 1500 mg via INTRAVENOUS
  Filled 2015-10-03 (×7): qty 15

## 2015-10-03 MED ORDER — FUROSEMIDE 10 MG/ML IJ SOLN
40.0000 mg | Freq: Two times a day (BID) | INTRAMUSCULAR | Status: DC
  Administered 2015-10-03 – 2015-10-07 (×6): 40 mg via INTRAVENOUS
  Filled 2015-10-03 (×6): qty 4

## 2015-10-03 MED ORDER — LORAZEPAM 2 MG/ML IJ SOLN
0.5000 mg | INTRAMUSCULAR | Status: DC | PRN
  Administered 2015-10-03 – 2015-10-08 (×9): 0.5 mg via INTRAVENOUS
  Filled 2015-10-03 (×9): qty 1

## 2015-10-03 MED ORDER — POTASSIUM CHLORIDE 10 MEQ/100ML IV SOLN
10.0000 meq | INTRAVENOUS | Status: AC
  Administered 2015-10-03 (×4): 10 meq via INTRAVENOUS
  Filled 2015-10-03 (×4): qty 100

## 2015-10-03 MED ORDER — LORAZEPAM 2 MG/ML IJ SOLN
0.5000 mg | Freq: Once | INTRAMUSCULAR | Status: AC
  Administered 2015-10-03: 0.5 mg via INTRAVENOUS
  Filled 2015-10-03: qty 1

## 2015-10-03 MED ORDER — VALPROATE SODIUM 500 MG/5ML IV SOLN
250.0000 mg | Freq: Two times a day (BID) | INTRAVENOUS | Status: DC
  Administered 2015-10-03 – 2015-10-05 (×6): 250 mg via INTRAVENOUS
  Filled 2015-10-03 (×7): qty 2.5

## 2015-10-03 MED ORDER — LORAZEPAM 2 MG/ML IJ SOLN
INTRAMUSCULAR | Status: AC
  Administered 2015-10-03: 0.5 mg
  Filled 2015-10-03: qty 1

## 2015-10-03 NOTE — Progress Notes (Addendum)
Patient's granddaughter Jill White was called and notified about patient's increasing agitation. Received permission to give low dose ativan when needed and low dose morphine when needed. On call provider with [facility] was notified and new orders were written. Jill White was called and again and made aware of medications that would be given.

## 2015-10-03 NOTE — Progress Notes (Addendum)
[facility] PROGRESS NOTE   Jill White IDP:[id number] DOB: 11-Dec-1940 DOA: 10/02/2015 PCP: Laurey Morale, MD  HPI/Subjective: Patient seen on BiPAP, was agitated last night needed Ativan. Continue to be agitated today. ABG showed improvement of PCO2 from undetectable to 73.  Assessment/Plan: Active Problems:   Essential hypertension   Chronic obstructive pulmonary disease (HCC)   Acute on chronic respiratory failure with hypoxia and hypercapnia (HCC)   Encephalopathy acute   Acute delirium   Aortic stenosis   Acute systolic heart failure (HCC)   Acute respiratory failure with hypoxia (HCC)   Acute respiratory failure with hypoxia and hypercarbia (HCC)   Acute on chronic respiratory failure with hypoxia and hypercapnia This is secondary to acute exacerbation of COPD and acute on chronic CHF. Patient has very small respiratory capacity secondary to the COPD/CHF. Currently on BiPAP, continue treatment.  Acute on chronic systolic CHF Patient is in florid volume overload, her BNP is elevated and she has positive JVD. She had a previous 2-D echo that showed moderate aortic stenosis, physical examination a cannot appreciate her S2. He has met with palliative of care and she still remains a full code. Palliative care so patient in previous different 2 admissions, she was still wanted full code and full scope of treatment.  Encephalopathy acute: Acute metabolic encephalopathy secondary to hypercapnia, she is on BiPAP. Has frequent episodes of agitation, Ativan used. Avoid other sedatives.  Aortic stenosis: Moderate.  Essential hypertension: Stable, she has an ejection fraction of 45% we'll go ahead and start an ACE inhibitor in the morning.  Chronic obstructive pulmonary disease (HCC) Continue Spiriva the ED gave her a dose of steroids decrease it to 40 once a day, will be quick to taper them down continue inhalers. There is unlikely a COPD exacerbation, she has JVD  crackles in her lungs with elevated BMP moving good air and no wheezing to auscultation.  Hypokalemia Repleted with IV supplements, because of agitation and BiPAP she cannot take oral medications.   Code Status: Full Code Family Communication: Plan discussed with the patient. Disposition Plan: Remains inpatient Diet: Diet NPO time specified  Consultants:  None, low threshold to consult PCCM if not improving.  Procedures:  None  Antibiotics:  Levofloxacin (indicate start date, and stop date if known)   Objective: Filed Vitals:   10/03/15 0400 10/03/15 0800  BP: 120/106   Pulse: 78   Temp: 99.3 F (37.4 C) 98.2 F (36.8 C)  Resp:      Intake/Output Summary (Last 24 hours) at 10/03/15 1103 Last data filed at 10/03/15 0700  Gross per 24 hour  Intake     96 ml  Output   1800 ml  Net  -1704 ml   Filed Weights   10/03/15 0500  Weight: 47.1 kg (103 lb 13.4 oz)    Exam: General: Alert and awake, oriented x3, not in any acute distress. HEENT: anicteric sclera, pupils reactive to light and accommodation, EOMI CVS: S1-S2 clear, no murmur rubs or gallops Chest: clear to auscultation bilaterally, no wheezing, rales or rhonchi Abdomen: soft nontender, nondistended, normal bowel sounds, no organomegaly Extremities: no cyanosis, clubbing or edema noted bilaterally Neuro: Cranial nerves II-XII intact, no focal neurological deficits  Data Reviewed: Basic Metabolic Panel:  Recent Labs Lab 09/27/15 0324 10/02/15 1237 10/03/15 0326  NA  --  133* 139  K  --  3.6 3.0*  CL  --  73* 71*  CO2  --  49* >50*  GLUCOSE  --  129* 114*  BUN  --  12 11  CREATININE 0.35* 0.47 0.47  CALCIUM  --  7.9* 7.4*   Liver Function Tests:  Recent Labs Lab 10/02/15 1237  AST 26  ALT 69*  ALKPHOS 64  BILITOT 0.6  PROT 6.1*  ALBUMIN 3.4*   No results for input(s): LIPASE, AMYLASE in the last 168 hours. No results for input(s): AMMONIA in the last 168 hours. CBC:  Recent  Labs Lab 10/02/15 1237 10/03/15 0326  WBC 14.3* 11.5*  NEUTROABS 11.5*  --   HGB 12.6 11.6*  HCT 40.8 35.7*  MCV 100.7* 96.2  PLT 355 347   Cardiac Enzymes: No results for input(s): CKTOTAL, CKMB, CKMBINDEX, TROPONINI in the last 168 hours. BNP (last 3 results)  Recent Labs  09/14/15 1700 09/16/15 0433 10/02/15 1237  BNP 281.8* 629.6* 616.0*    ProBNP (last 3 results) No results for input(s): PROBNP in the last 8760 hours.  CBG: No results for input(s): GLUCAP in the last 168 hours.  Micro Recent Results (from the past 240 hour(s))  Blood Culture (routine x 2)     Status: None (Preliminary result)   Collection Time: 10/02/15 12:47 PM  Result Value Ref Range Status   Specimen Description BLOOD RIGHT ANTECUBITAL  Final   Special Requests   Final    BOTTLES DRAWN AEROBIC AND ANAEROBIC 3.5 CC Performed at [facility]    Culture PENDING  Incomplete   Report Status PENDING  Incomplete     Studies: Ct Head Wo Contrast  10/02/2015  CLINICAL DATA:  75 year old female with increasing shortness of breath and confusion for 2 days. Wheezing and rales. EXAM: CT HEAD WITHOUT CONTRAST TECHNIQUE: Contiguous axial images were obtained from the base of the skull through the vertex without intravenous contrast. COMPARISON:  Brain MRI 09/23/2015. FINDINGS: Patchy areas of mild decreased attenuation are noted throughout the deep and periventricular white matter of the cerebral hemispheres bilaterally, compatible with mild chronic microvascular ischemic disease. No acute intracranial abnormalities. Specifically, no evidence of acute intracranial hemorrhage, no definite findings of acute/subacute cerebral ischemia, no mass, mass effect, hydrocephalus or abnormal intra or extra-axial fluid collections. Visualized paranasal sinuses and mastoids are well pneumatized, with exception of a small air-fluid level in dependent portion of the left maxillary sinus (which is incompletely  visualized). No acute displaced skull fractures are identified. IMPRESSION: 1. No acute intracranial abnormalities. 2. Mild chronic microvascular ischemic changes in the cerebral white matter. 3. Small air-fluid level in the left maxillary sinus which is incompletely visualized. Clinical correlation for signs and symptoms of sinusitis is suggested. Electronically Signed   By: Vinnie Langton M.D.   On: 10/02/2015 14:41   Dg Chest Portable 1 View  10/02/2015  CLINICAL DATA:  Increasing shortness of breath for 2 days, hypertension, COPD, CHF, smoker EXAM: PORTABLE CHEST 1 VIEW COMPARISON:  Portable exam 1305 hours compared to 09/22/2015 FINDINGS: Enlargement of cardiac silhouette with pulmonary vascular congestion. Atherosclerotic calcification and tortuosity of thoracic aorta. Emphysematous and chronic bronchitic changes compatible with COPD. Minimal chronic interstitial prominence, stable. No definite infiltrate, pleural effusion or pneumothorax. Diffuse osseous demineralization. IMPRESSION: Enlargement of cardiac silhouette with pulmonary vascular congestion. Probable changes of COPD and question chronic interstitial lung disease. No acute abnormalities. Electronically Signed   By: Lavonia Dana M.D.   On: 10/02/2015 13:19    Scheduled Meds: . albuterol  2.5 mg Nebulization TID  . aspirin EC  81 mg Oral QODAY  . carvedilol  6.25 mg  Oral BID WC  . clonazePAM  0.5 mg Oral QHS  . feeding supplement (ENSURE ENLIVE)  237 mL Oral BID BM  . furosemide  80 mg Intravenous Q12H  . heparin  5,000 Units Subcutaneous 3 times per day  . levETIRAcetam  1,500 mg Intravenous Q12H  . lisinopril  2.5 mg Oral Daily  . methylPREDNISolone (SOLU-MEDROL) injection  40 mg Intravenous Q24H  . mometasone-formoterol  2 puff Inhalation BID  . pantoprazole  40 mg Oral Daily  . potassium chloride  10 mEq Intravenous Q1 Hr x 4  . potassium chloride  10 mEq Intravenous Q1 Hr x 4  . sodium chloride flush  3 mL Intravenous Q12H   . sodium chloride flush  3 mL Intravenous Q12H  . tiotropium  18 mcg Inhalation Daily  . valproate sodium  250 mg Intravenous Q12H   Continuous Infusions:      Time spent: 35 minutes    [facility]  [facility] Pager [phone] If 7PM-7AM, please contact night-coverage at [email or web address], password [facility] 10/03/2015, 11:03 AM  LOS: 1 day

## 2015-10-04 LAB — BASIC METABOLIC PANEL
BUN: 17 mg/dL (ref 6–20)
CALCIUM: 7.9 mg/dL — AB (ref 8.9–10.3)
CO2: >50 mmol/L — ABNORMAL HIGH (ref 22–32)
Chloride: 71 mmol/L — ABNORMAL LOW (ref 101–111)
Creatinine, Ser: 0.47 mg/dL (ref 0.44–1.00)
Glucose, Bld: 104 mg/dL — ABNORMAL HIGH (ref 65–99)
POTASSIUM: 3.5 mmol/L (ref 3.5–5.1)
SODIUM: 136 mmol/L (ref 135–145)

## 2015-10-04 LAB — URINE CULTURE: CULTURE: NO GROWTH

## 2015-10-04 MED ORDER — POTASSIUM CHLORIDE 10 MEQ/100ML IV SOLN
10.0000 meq | INTRAVENOUS | Status: AC
  Administered 2015-10-04 (×4): 10 meq via INTRAVENOUS
  Filled 2015-10-04 (×4): qty 100

## 2015-10-04 MED ORDER — DIPHENHYDRAMINE HCL 50 MG/ML IJ SOLN
25.0000 mg | Freq: Once | INTRAMUSCULAR | Status: AC
  Administered 2015-10-04: 25 mg via INTRAVENOUS
  Filled 2015-10-04: qty 1

## 2015-10-04 NOTE — Progress Notes (Signed)
Utilization review completed.  

## 2015-10-04 NOTE — Progress Notes (Signed)
[facility] PROGRESS NOTE   Jill White EUM:[id number] DOB: 12/08/1940 DOA: 10/02/2015 PCP: Laurey Morale, MD  HPI/Subjective: Continue BiPAP for now, still agitated when she is off of it. We'll need to talk to her granddaughter who is a [employer]ar[employer]. Avoid excessive oxygen administration.  Assessment/Plan: Active Problems:   Essential hypertension   Chronic obstructive pulmonary disease (HCC)   Acute on chronic respiratory failure with hypoxia and hypercapnia (HCC)   Encephalopathy acute   Acute delirium   Aortic stenosis   Acute systolic heart failure (HCC)   Acute respiratory failure with hypoxia (HCC)   Acute respiratory failure with hypoxia and hypercarbia (HCC)   Acute on chronic respiratory failure with hypoxia and hypercapnia This is secondary to acute exacerbation of COPD and acute on chronic CHF. Patient has very small respiratory reserve secondary to the COPD/CHF. Currently on BiPAP, continue treatment.  Acute on chronic systolic CHF Patient is in florid volume overload, her BNP is elevated and she has positive JVD. She had a previous 2-D echo that showed moderate aortic stenosis, physical examination a cannot appreciate her S2. He has met with palliative of care and she still remains a full code. Palliative care so patient in previous different 2 admissions, she was still wanted full code and full scope of treatment. Continue IV Lasix, check BMP in a.m.  Encephalopathy acute: Acute metabolic encephalopathy secondary to hypercapnia, she is on BiPAP. Has frequent episodes of agitation, Ativan used. Avoid other sedatives.  Aortic stenosis: Moderate.  Essential hypertension: Stable, she has an ejection fraction of 45% we'll go ahead and start an ACE inhibitor in the morning.  Chronic obstructive pulmonary disease (HCC) Continue Spiriva the ED gave her a dose of steroids decrease it to 40 once a day, will be quick to taper them down continue inhalers. Does not  wheeze but continue current management.  Hypokalemia Repleted with IV supplements, because of agitation and BiPAP she cannot take oral medications.   Code Status: Full Code Family Communication: Plan discussed with the patient. Disposition Plan: Remains inpatient Diet: Diet NPO time specified  Consultants:  None, low threshold to consult PCCM if not improving.  Procedures:  None  Antibiotics:  Levofloxacin (indicate start date, and stop date if known)   Objective: Filed Vitals:   10/04/15 1000 10/04/15 1100  BP: 98/61   Pulse: 91 100  Temp:    Resp: 21 19    Intake/Output Summary (Last 24 hours) at 10/04/15 1227 Last data filed at 10/04/15 0900  Gross per 24 hour  Intake    588 ml  Output   1990 ml  Net  -1402 ml   Filed Weights   10/03/15 0500 10/04/15 0500  Weight: 47.1 kg (103 lb 13.4 oz) 45.7 kg (100 lb 12 oz)    Exam: General: Alert and awake, oriented x3, not in any acute distress. HEENT: anicteric sclera, pupils reactive to light and accommodation, EOMI CVS: S1-S2 clear, no murmur rubs or gallops Chest: clear to auscultation bilaterally, no wheezing, rales or rhonchi Abdomen: soft nontender, nondistended, normal bowel sounds, no organomegaly Extremities: no cyanosis, clubbing or edema noted bilaterally Neuro: Cranial nerves II-XII intact, no focal neurological deficits  Data Reviewed: Basic Metabolic Panel:  Recent Labs Lab 10/02/15 1237 10/03/15 0326 10/04/15 0329  NA 133* 139 136  K 3.6 3.0* 3.5  CL 73* 71* 71*  CO2 49* >50* >50*  GLUCOSE 129* 114* 104*  BUN '12 11 17  '$ CREATININE 0.47 0.47 0.47  CALCIUM 7.9* 7.4*  7.9*   Liver Function Tests:  Recent Labs Lab 10/02/15 1237  AST 26  ALT 69*  ALKPHOS 64  BILITOT 0.6  PROT 6.1*  ALBUMIN 3.4*   No results for input(s): LIPASE, AMYLASE in the last 168 hours. No results for input(s): AMMONIA in the last 168 hours. CBC:  Recent Labs Lab 10/02/15 1237 10/03/15 0326  WBC 14.3*  11.5*  NEUTROABS 11.5*  --   HGB 12.6 11.6*  HCT 40.8 35.7*  MCV 100.7* 96.2  PLT 355 347   Cardiac Enzymes: No results for input(s): CKTOTAL, CKMB, CKMBINDEX, TROPONINI in the last 168 hours. BNP (last 3 results)  Recent Labs  09/14/15 1700 09/16/15 0433 10/02/15 1237  BNP 281.8* 629.6* 616.0*    ProBNP (last 3 results) No results for input(s): PROBNP in the last 8760 hours.  CBG: No results for input(s): GLUCAP in the last 168 hours.  Micro Recent Results (from the past 240 hour(s))  Blood Culture (routine x 2)     Status: None (Preliminary result)   Collection Time: 10/02/15 12:28 PM  Result Value Ref Range Status   Specimen Description BLOOD RIGHT WRIST  Final   Special Requests IN PEDIATRIC BOTTLE 3CC  Final   Culture   Final    NO GROWTH 1 DAY Performed at [facility]    Report Status PENDING  Incomplete  Blood Culture (routine x 2)     Status: None (Preliminary result)   Collection Time: 10/02/15 12:47 PM  Result Value Ref Range Status   Specimen Description BLOOD RIGHT ANTECUBITAL  Final   Special Requests BOTTLES DRAWN AEROBIC AND ANAEROBIC 3.5 CC  Final   Culture   Final    NO GROWTH 1 DAY Performed at [facility]    Report Status PENDING  Incomplete  Urine culture     Status: None (Preliminary result)   Collection Time: 10/02/15  2:17 PM  Result Value Ref Range Status   Specimen Description URINE, CATHETERIZED  Final   Special Requests NONE  Final   Culture   Final    NO GROWTH < 24 HOURS Performed at [facility]    Report Status PENDING  Incomplete     Studies: Ct Head Wo Contrast  10/02/2015  CLINICAL DATA:  75 year old female with increasing shortness of breath and confusion for 2 days. Wheezing and rales. EXAM: CT HEAD WITHOUT CONTRAST TECHNIQUE: Contiguous axial images were obtained from the base of the skull through the vertex without intravenous contrast. COMPARISON:  Brain MRI 09/23/2015. FINDINGS: Patchy areas of  mild decreased attenuation are noted throughout the deep and periventricular white matter of the cerebral hemispheres bilaterally, compatible with mild chronic microvascular ischemic disease. No acute intracranial abnormalities. Specifically, no evidence of acute intracranial hemorrhage, no definite findings of acute/subacute cerebral ischemia, no mass, mass effect, hydrocephalus or abnormal intra or extra-axial fluid collections. Visualized paranasal sinuses and mastoids are well pneumatized, with exception of a small air-fluid level in dependent portion of the left maxillary sinus (which is incompletely visualized). No acute displaced skull fractures are identified. IMPRESSION: 1. No acute intracranial abnormalities. 2. Mild chronic microvascular ischemic changes in the cerebral white matter. 3. Small air-fluid level in the left maxillary sinus which is incompletely visualized. Clinical correlation for signs and symptoms of sinusitis is suggested. Electronically Signed   By: Vinnie Langton M.D.   On: 10/02/2015 14:41   Dg Chest Portable 1 View  10/02/2015  CLINICAL DATA:  Increasing shortness of breath for  2 days, hypertension, COPD, CHF, smoker EXAM: PORTABLE CHEST 1 VIEW COMPARISON:  Portable exam 1305 hours compared to 09/22/2015 FINDINGS: Enlargement of cardiac silhouette with pulmonary vascular congestion. Atherosclerotic calcification and tortuosity of thoracic aorta. Emphysematous and chronic bronchitic changes compatible with COPD. Minimal chronic interstitial prominence, stable. No definite infiltrate, pleural effusion or pneumothorax. Diffuse osseous demineralization. IMPRESSION: Enlargement of cardiac silhouette with pulmonary vascular congestion. Probable changes of COPD and question chronic interstitial lung disease. No acute abnormalities. Electronically Signed   By: Lavonia Dana M.D.   On: 10/02/2015 13:19    Scheduled Meds: . albuterol  2.5 mg Nebulization TID  . aspirin EC  81 mg Oral  QODAY  . carvedilol  6.25 mg Oral BID WC  . clonazePAM  0.5 mg Oral QHS  . feeding supplement (ENSURE ENLIVE)  237 mL Oral BID BM  . furosemide  40 mg Intravenous BID  . heparin  5,000 Units Subcutaneous 3 times per day  . levETIRAcetam  1,500 mg Intravenous Q12H  . lisinopril  2.5 mg Oral Daily  . methylPREDNISolone (SOLU-MEDROL) injection  40 mg Intravenous Q24H  . mometasone-formoterol  2 puff Inhalation BID  . pantoprazole  40 mg Oral Daily  . sodium chloride flush  3 mL Intravenous Q12H  . sodium chloride flush  3 mL Intravenous Q12H  . tiotropium  18 mcg Inhalation Daily  . valproate sodium  250 mg Intravenous Q12H   Continuous Infusions:      Time spent: 35 minutes    [facility] A  [facility] Pager [phone] If 7PM-7AM, please contact night-coverage at [email or web address], password [place] 10/04/2015, 12:27 PM  LOS: 2 days

## 2015-10-05 LAB — BASIC METABOLIC PANEL
Anion gap: 12 (ref 5–15)
BUN: 17 mg/dL (ref 6–20)
CHLORIDE: 78 mmol/L — AB (ref 101–111)
CO2: 44 mmol/L — AB (ref 22–32)
CREATININE: 0.45 mg/dL (ref 0.44–1.00)
Calcium: 8.1 mg/dL — ABNORMAL LOW (ref 8.9–10.3)
GFR calc non Af Amer: >60 mL/min (ref >60)
Glucose, Bld: 97 mg/dL (ref 65–99)
POTASSIUM: 3.5 mmol/L (ref 3.5–5.1)
Sodium: 134 mmol/L — ABNORMAL LOW (ref 135–145)

## 2015-10-05 LAB — BLOOD GAS, ARTERIAL
EXPIRATORY PAP: 8
FIO2: 0.5
INSPIRATORY PAP: 18
MODE: POSITIVE
O2 Saturation: 89.1 %
PATIENT TEMPERATURE: 98.6
PH ART: 7.3 — AB (ref 7.350–7.450)
RATE: 12 resp/min
pO2, Arterial: 61.9 mmHg — ABNORMAL LOW (ref 80.0–100.0)

## 2015-10-05 NOTE — Progress Notes (Signed)
Patient has been off of BiPAP for about five hours now and doing well. Resting comfortably and no signs of significant respiratory distress. Patient's power of attorney holder requested that we take her off earlier this evening around 2230. BiPAP still at bedside in case of any distress. RT will continue to monitor.

## 2015-10-05 NOTE — Progress Notes (Signed)
Patient has been off BiPAP for about three hours and placed on venturi mask at 45%. Patient is tolerating it well with an SpO2 of 96%. RT will continue to monitor.

## 2015-10-05 NOTE — Progress Notes (Signed)
[facility] PROGRESS NOTE   Jill White OIZ:[id number] DOB: [birth year] DOA: 10/02/2015 PCP: Laurey Morale, MD  HPI/Subjective: I spoke with her granddaughter who is a CNA. She acknowledges that she is getting tired from this, for now she will be DNR/DNI. She understands she has end-stage COPD with very little respiratory reserve, asked hospice/palliative team to evaluate her.  Assessment/Plan: Active Problems:   Essential hypertension   Chronic obstructive pulmonary disease (HCC)   Acute on chronic respiratory failure with hypoxia and hypercapnia (HCC)   Encephalopathy acute   Acute delirium   Aortic stenosis   Acute systolic heart failure (HCC)   Acute respiratory failure with hypoxia (HCC)   Acute respiratory failure with hypoxia and hypercarbia (HCC)   Acute on chronic respiratory failure with hypoxia and hypercapnia This is secondary to acute exacerbation of COPD and acute on chronic CHF. Patient has very small respiratory reserve secondary to the COPD/CHF. Off of BiPAP since yesterday, now DNR/DNI.  Acute on chronic systolic CHF Patient is in florid volume overload, her BNP is elevated and she has positive JVD. She had a previous 2-D echo that showed moderate aortic stenosis, physical examination a cannot appreciate her S2. He has met with palliative of care and she still remains a full code. Palliative care so patient in previous different 2 admissions, she was still wanted full code and full scope of treatment. Continue IV Lasix, continue to check BMP in a.m.  Encephalopathy acute: Acute metabolic encephalopathy secondary to hypercapnia, she is on BiPAP. Has frequent episodes of agitation, Ativan used. Avoid other sedatives.  Aortic stenosis: Moderate.  Essential hypertension: Stable, she has an ejection fraction of 45% we'll go ahead and start an ACE inhibitor in the morning.  Chronic obstructive pulmonary disease (HCC) Continue Spiriva the ED gave her  a dose of steroids decrease it to 40 once a day, will be quick to taper them down continue inhalers. Does not wheeze but continue current management.  Hypokalemia Repleted with IV supplements, because of agitation and BiPAP she cannot take oral medications.  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 She is on chronic oxygen at home.  Code Status: DNR Family Communication: Plan discussed with the patient. Disposition Plan: Remains inpatient Diet: Diet regular Room service appropriate?: Yes; Fluid consistency:: Thin  Consultants:  None, low threshold to consult PCCM if not improving.  Procedures:  None  Antibiotics:  Levofloxacin (indicate start date, and stop date if known)   Objective: Filed Vitals:   10/05/15 1000 10/05/15 1200  BP: 94/66 93/60  Pulse: 42 83  Temp:    Resp: 16 20    Intake/Output Summary (Last 24 hours) at 10/05/15 1231 Last data filed at 10/05/15 1100  Gross per 24 hour  Intake    835 ml  Output    520 ml  Net    315 ml   Filed Weights   10/03/15 0500 10/04/15 0500 10/05/15 0500  Weight: 47.1 kg (103 lb 13.4 oz) 45.7 kg (100 lb 12 oz) 44.7 kg (98 lb 8.7 oz)    Exam: General: Alert and awake, oriented x3, not in any acute distress. HEENT: anicteric sclera, pupils reactive to light and accommodation, EOMI CVS: S1-S2 clear, no murmur rubs or gallops Chest: clear to auscultation bilaterally, no wheezing, rales or rhonchi Abdomen: soft nontender, nondistended, normal bowel sounds, no organomegaly Extremities: no cyanosis, clubbing or edema noted bilaterally Neuro: Cranial nerves II-XII intact, no focal neurological deficits  Data Reviewed: Basic Metabolic Panel:  Recent Labs Lab 10/02/15 1237 10/03/15 0326 10/04/15 0329 10/05/15 0310  NA 133* 139 136 134*  K 3.6 3.0* 3.5 3.5  CL 73* 71* 71* 78*  CO2 49* >50* >50* 44*  GLUCOSE 129* 114* 104* 97  BUN '12 11 17 17  '$ CREATININE 0.47 0.47 0.47 0.45  CALCIUM 7.9* 7.4*  7.9* 8.1*   Liver Function Tests:  Recent Labs Lab 10/02/15 1237  AST 26  ALT 69*  ALKPHOS 64  BILITOT 0.6  PROT 6.1*  ALBUMIN 3.4*   No results for input(s): LIPASE, AMYLASE in the last 168 hours. No results for input(s): AMMONIA in the last 168 hours. CBC:  Recent Labs Lab 10/02/15 1237 10/03/15 0326  WBC 14.3* 11.5*  NEUTROABS 11.5*  --   HGB 12.6 11.6*  HCT 40.8 35.7*  MCV 100.7* 96.2  PLT 355 347   Cardiac Enzymes: No results for input(s): CKTOTAL, CKMB, CKMBINDEX, TROPONINI in the last 168 hours. BNP (last 3 results)  Recent Labs  09/14/15 1700 09/16/15 0433 10/02/15 1237  BNP 281.8* 629.6* 616.0*    ProBNP (last 3 results) No results for input(s): PROBNP in the last 8760 hours.  CBG: No results for input(s): GLUCAP in the last 168 hours.  Micro Recent Results (from the past 240 hour(s))  Blood Culture (routine x 2)     Status: None (Preliminary result)   Collection Time: 10/02/15 12:28 PM  Result Value Ref Range Status   Specimen Description BLOOD RIGHT WRIST  Final   Special Requests IN PEDIATRIC BOTTLE 3CC  Final   Culture   Final    NO GROWTH 2 DAYS Performed at [facility]    Report Status PENDING  Incomplete  Blood Culture (routine x 2)     Status: None (Preliminary result)   Collection Time: 10/02/15 12:47 PM  Result Value Ref Range Status   Specimen Description BLOOD RIGHT ANTECUBITAL  Final   Special Requests BOTTLES DRAWN AEROBIC AND ANAEROBIC 3.5 CC  Final   Culture   Final    NO GROWTH 2 DAYS Performed at [facility]    Report Status PENDING  Incomplete  Urine culture     Status: None   Collection Time: 10/02/15  2:17 PM  Result Value Ref Range Status   Specimen Description URINE, CATHETERIZED  Final   Special Requests NONE  Final   Culture   Final    NO GROWTH 2 DAYS Performed at [facility]    Report Status 10/04/2015 FINAL  Final     Studies: No results found.  Scheduled Meds: . albuterol   2.5 mg Nebulization TID  . aspirin EC  81 mg Oral QODAY  . carvedilol  6.25 mg Oral BID WC  . clonazePAM  0.5 mg Oral QHS  . feeding supplement (ENSURE ENLIVE)  237 mL Oral BID BM  . furosemide  40 mg Intravenous BID  . heparin  5,000 Units Subcutaneous 3 times per day  . levETIRAcetam  1,500 mg Intravenous Q12H  . lisinopril  2.5 mg Oral Daily  . methylPREDNISolone (SOLU-MEDROL) injection  40 mg Intravenous Q24H  . mometasone-formoterol  2 puff Inhalation BID  . pantoprazole  40 mg Oral Daily  . sodium chloride flush  3 mL Intravenous Q12H  . sodium chloride flush  3 mL Intravenous Q12H  . tiotropium  18 mcg Inhalation Daily  . valproate sodium  250 mg Intravenous Q12H   Continuous Infusions:      Time spent: 35  minutes    [facility] A  [facility] Pager [phone] If 7PM-7AM, please contact night-coverage at [email or web address], password [facility] 10/05/2015, 12:31 PM  LOS: 3 days

## 2015-10-06 DIAGNOSIS — Z7189 Other specified counseling: Secondary | ICD-10-CM

## 2015-10-06 DIAGNOSIS — Z515 Encounter for palliative care: Secondary | ICD-10-CM

## 2015-10-06 MED ORDER — LEVETIRACETAM 500 MG PO TABS
1500.0000 mg | ORAL_TABLET | Freq: Two times a day (BID) | ORAL | Status: DC
  Administered 2015-10-06 – 2015-10-08 (×5): 1500 mg via ORAL
  Filled 2015-10-06 (×5): qty 3

## 2015-10-06 MED ORDER — DIVALPROEX SODIUM 250 MG PO DR TAB
250.0000 mg | DELAYED_RELEASE_TABLET | Freq: Two times a day (BID) | ORAL | Status: DC
  Administered 2015-10-06 – 2015-10-08 (×5): 250 mg via ORAL
  Filled 2015-10-06 (×6): qty 1

## 2015-10-06 NOTE — Progress Notes (Signed)
Key Points: Use following P&T approved IV to PO non-antibiotic change policy.  Description contains the criteria that are approved Note: Policy Excludes:  Esophagectomy patientsPHARMACIST - PHYSICIAN COMMUNICATION DR:   Hartford Poli CONCERNING: IV to Oral Route Change Policy  RECOMMENDATION: This patient is receiving keppra/depakote by the intravenous route.  Based on criteria approved by the Pharmacy and Therapeutics Committee, the intravenous medication(s) is/are being converted to the equivalent oral dose form(s).   DESCRIPTION: These criteria include:  The patient is eating (either orally or via tube) and/or has been taking other orally administered medications for a least 24 hours  The patient has no evidence of active gastrointestinal bleeding or impaired GI absorption (gastrectomy, short bowel, patient on TNA or NPO).  If you have questions about this conversion, please contact the Pharmacy Department  []   [phone] )  [facility] []   [phone] )  [facility]  []   [phone] )  [facility] [x]   [phone] )  [place], [place], [facility] 10/06/2015 8:56 AM

## 2015-10-06 NOTE — Progress Notes (Signed)
Pt was on 1L of O2 maintaining at 90-92%. Pt had been lethargic throughout morning but sat up in the chair for lunch. Went back to bed after eating. When RN came back to floor from break, covering RN said that the family complained that the pt was lethargic and O2 sats were low 80s. Covering RN had placed pt on 6L and O2 sats were 92%. MD made aware. Ordered to keep sats at mid to upper 80s. When pt started to arouse, dropped down to 3L and sats are low 90s. Callie Fielding RN

## 2015-10-06 NOTE — Progress Notes (Signed)
[facility] PROGRESS NOTE   Jill White [id number] DOB: Jun 02, 1941 DOA: 10/02/2015 PCP: Laurey Morale, MD  HPI/ This is a 75 year old female with severe COPD, oxygen dependent and has chronic CO2 retention came into the hospital with confusion and lethargy. She was recently in the hospital discharge after she was on Zantac for some time for COPD exacerbation. She went home only for 4 days, on admission her ABGs showed an measurable PCO2. On 10/05/2015 she reported she wants to be DO NOT RESUSCITATE, I spoke with her granddaughter at bedside and palliative consulted. On 10/06/2015 she reported that she wants to be full code, she understand that she could be intubated and weaning off of ventilator will be difficult, along with our other comorbidities.  Subjective: Feels okay, wide awake alert earlier today. However I received a call from her nurse that she is starting to be more lethargic in the afternoon.  Assessment/Plan: Active Problems:   Essential hypertension   Chronic obstructive pulmonary disease (HCC)   Acute on chronic respiratory failure with hypoxia and hypercapnia (HCC)   Encephalopathy acute   Acute delirium   Aortic stenosis   Acute systolic heart failure (HCC)   Acute respiratory failure with hypoxia (HCC)   Acute respiratory failure with hypoxia and hypercarbia (HCC)   Acute on chronic respiratory failure with hypoxia and hypercapnia This is secondary to acute exacerbation of COPD and acute on chronic CHF. Patient has very small respiratory reserve secondary to the COPD/CHF. Off of BiPAP since yesterday, DNR/DNI on 10/05/2015, reversed back to full code on 10/06/2015. Was about to transfer her out of the ICU but developed lethargy and sleepiness, if continues we'll place back on BiPAP.  Acute on chronic systolic CHF Patient is in florid volume overload, her BNP is elevated and she has positive JVD. She had a previous 2-D echo that showed moderate aortic  stenosis, physical examination a cannot appreciate her S2. He has met with palliative of care and she still remains a full code. Palliative care so patient in previous different 2 admissions, she was still wanted full code and full scope of treatment. Continue IV Lasix, continue to check BMP in a.m.  Encephalopathy acute: Acute metabolic encephalopathy secondary to hypercapnia, she is on BiPAP. Has frequent episodes of agitation, Ativan used. Avoid other sedatives.  Aortic stenosis: Moderate.  Essential hypertension: Stable, she has an ejection fraction of 45% we'll go ahead and start an ACE inhibitor in the morning.  Chronic obstructive pulmonary disease (HCC) Continue Spiriva the ED gave her a dose of steroids decrease it to 40 once a day, will be quick to taper them down continue inhalers. Does not wheeze but continue current management.  Hypokalemia Repleted with IV supplements, because of agitation and BiPAP she cannot take oral medications.  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 She is on chronic oxygen at home.  Code Status: Full Code Family Communication: Plan discussed with the patient. Disposition Plan: Remains inpatient Diet: Diet regular Room service appropriate?: Yes; Fluid consistency:: Thin  Consultants:  None, low threshold to consult PCCM if not improving.  Procedures:  None  Antibiotics:  Levofloxacin (indicate start date, and stop date if known)   Objective: Filed Vitals:   10/06/15 0800 10/06/15 1300  BP:    Pulse:    Temp: 98.1 F (36.7 C) 97.8 F (36.6 C)  Resp:      Intake/Output Summary (Last 24 hours) at 10/06/15 1439 Last data filed at 10/06/15 0938  Gross  per 24 hour  Intake  447.5 ml  Output   1290 ml  Net -842.5 ml   Filed Weights   10/03/15 0500 10/04/15 0500 10/05/15 0500  Weight: 47.1 kg (103 lb 13.4 oz) 45.7 kg (100 lb 12 oz) 44.7 kg (98 lb 8.7 oz)    Exam: General: Alert and awake, oriented  x3, not in any acute distress. HEENT: anicteric sclera, pupils reactive to light and accommodation, EOMI CVS: S1-S2 clear, no murmur rubs or gallops Chest: clear to auscultation bilaterally, no wheezing, rales or rhonchi Abdomen: soft nontender, nondistended, normal bowel sounds, no organomegaly Extremities: no cyanosis, clubbing or edema noted bilaterally Neuro: Cranial nerves II-XII intact, no focal neurological deficits  Data Reviewed: Basic Metabolic Panel:  Recent Labs Lab 10/02/15 1237 10/03/15 0326 10/04/15 0329 10/05/15 0310  NA 133* 139 136 134*  K 3.6 3.0* 3.5 3.5  CL 73* 71* 71* 78*  CO2 49* >50* >50* 44*  GLUCOSE 129* 114* 104* 97  BUN '12 11 17 17  '$ CREATININE 0.47 0.47 0.47 0.45  CALCIUM 7.9* 7.4* 7.9* 8.1*   Liver Function Tests:  Recent Labs Lab 10/02/15 1237  AST 26  ALT 69*  ALKPHOS 64  BILITOT 0.6  PROT 6.1*  ALBUMIN 3.4*   No results for input(s): LIPASE, AMYLASE in the last 168 hours. No results for input(s): AMMONIA in the last 168 hours. CBC:  Recent Labs Lab 10/02/15 1237 10/03/15 0326  WBC 14.3* 11.5*  NEUTROABS 11.5*  --   HGB 12.6 11.6*  HCT 40.8 35.7*  MCV 100.7* 96.2  PLT 355 347   Cardiac Enzymes: No results for input(s): CKTOTAL, CKMB, CKMBINDEX, TROPONINI in the last 168 hours. BNP (last 3 results)  Recent Labs  09/14/15 1700 09/16/15 0433 10/02/15 1237  BNP 281.8* 629.6* 616.0*    ProBNP (last 3 results) No results for input(s): PROBNP in the last 8760 hours.  CBG: No results for input(s): GLUCAP in the last 168 hours.  Micro Recent Results (from the past 240 hour(s))  Blood Culture (routine x 2)     Status: None (Preliminary result)   Collection Time: 10/02/15 12:28 PM  Result Value Ref Range Status   Specimen Description BLOOD RIGHT WRIST  Final   Special Requests IN PEDIATRIC BOTTLE 3CC  Final   Culture   Final    NO GROWTH 4 DAYS Performed at [facility]    Report Status PENDING  Incomplete    Blood Culture (routine x 2)     Status: None (Preliminary result)   Collection Time: 10/02/15 12:47 PM  Result Value Ref Range Status   Specimen Description BLOOD RIGHT ANTECUBITAL  Final   Special Requests BOTTLES DRAWN AEROBIC AND ANAEROBIC 3.5 CC  Final   Culture   Final    NO GROWTH 4 DAYS Performed at [facility]    Report Status PENDING  Incomplete  Urine culture     Status: None   Collection Time: 10/02/15  2:17 PM  Result Value Ref Range Status   Specimen Description URINE, CATHETERIZED  Final   Special Requests NONE  Final   Culture   Final    NO GROWTH 2 DAYS Performed at [facility]    Report Status 10/04/2015 FINAL  Final     Studies: No results found.  Scheduled Meds: . albuterol  2.5 mg Nebulization TID  . aspirin EC  81 mg Oral QODAY  . carvedilol  6.25 mg Oral BID WC  . clonazePAM  0.5 mg Oral QHS  . divalproex  250 mg Oral Q12H  . feeding supplement (ENSURE ENLIVE)  237 mL Oral BID BM  . furosemide  40 mg Intravenous BID  . heparin  5,000 Units Subcutaneous 3 times per day  . levETIRAcetam  1,500 mg Oral BID  . lisinopril  2.5 mg Oral Daily  . methylPREDNISolone (SOLU-MEDROL) injection  40 mg Intravenous Q24H  . mometasone-formoterol  2 puff Inhalation BID  . pantoprazole  40 mg Oral Daily  . sodium chloride flush  3 mL Intravenous Q12H  . sodium chloride flush  3 mL Intravenous Q12H  . tiotropium  18 mcg Inhalation Daily   Continuous Infusions:      Time spent: 35 minutes    [facility] A  [facility] Pager [phone] If 7PM-7AM, please contact night-coverage at [email or web address], password [name] 10/06/2015, 2:39 PM  LOS: 4 days

## 2015-10-06 NOTE — Progress Notes (Signed)
Resumed care of patient. Agree with previous assessment. Orders reviewed. Will continue to monitor. 

## 2015-10-06 NOTE — Consult Note (Signed)
Consultation Note Date: 10/06/2015   Patient Name: Jill White  DOB: 1940/12/28  MRN: [id number]  Age / Sex: 75 y.o., female  PCP: Laurey Morale, MD Referring Physician: Verlee Monte, MD  Reason for Consultation: Establishing goals of care and Psychosocial/spiritual support   Clinical Assessment/Narrative:   75 y.o. female past medical history of severe COPD oxygen dependent, moderate aortic stenosis, seizure disorder recently discharged from the hospital 4 days prior to admission for respiratory failure due to COPD.  Back to hospital via EMS for confusion and  shortness of breath that started on the day of admission.   Per family continued physical, functional and cognitive decline over the past several months. Patient has vacillated on her wishes regarding advanced directives Faces advanced directive decisions and anticipatory care needs    This NP Wadie Lessen reviewed medical records, received report from team, assessed the patient and then meet at the patient's bedside along with her grand-daughter/[name] and daughter/Kathy  to discuss diagnosis prognosis, GOC, EOL wishes disposition and options.   A detailed discussion was had today regarding advanced directives.  Concepts specific to code status, continued IV antibiotics and rehospitalization was had.  The difference between a aggressive medical intervention path  and a palliative comfort care path for this patient at this time was had.  Values and goals of care important to patient and family were attempted to be elicited.  Concept of Hospice and Palliative Care were discussed  Natural trajectory and expectations at EOL were discussed. Concept of mortality and limitations of medical interventions discussed    Questions and concerns addressed.  Hard Choices booklet left for review. Family encouraged to call with questions or concerns.  PMT will continue to  support holistically.  Primary Decision Maker: Patient with support of her grand-daughter/ [name]  Romine  HCPOA: yes    SUMMARY OF RECOMMENDATIONS  Code Status/Advance Care Planning: Full code- changed today    Code Status Orders        Start     Ordered   10/06/15 1258  Full code   Continuous     10/06/15 1301    Code Status History    Date Active Date Inactive Code Status Order ID Comments User Context   10/05/2015  8:56 AM 10/06/2015  1:01 PM DNR [id number]  Verlee Monte, MD Inpatient   10/02/2015  5:53 PM 10/05/2015  8:56 AM Full Code [id number]  Charlynne Cousins, MD Inpatient   09/13/2015  9:52 PM 09/28/2015  6:48 PM Full Code [id number]  Rise Patience, MD Inpatient   02/01/2015 10:26 PM 02/06/2015  7:40 PM Full Code [id number]  Etta Quill, DO ED   05/12/2014  7:57 PM 05/22/2014  7:22 PM Full Code [id number]  Waldemar Dickens, MD ED   12/02/2013  3:09 PM 12/03/2013  7:19 PM Full Code [id number]  [name] Mocha, MD Inpatient   12/01/2013  9:04 AM 12/02/2013  3:09 PM Full Code [id number]  Jacolyn Reedy, MD Inpatient   11/28/2013  9:20 AM 12/01/2013  9:04 AM Partial Code [id number]  Marijean Heath, NP Inpatient   11/23/2013  2:39 AM 11/28/2013  9:20 AM Full Code [id number]  Samella Parr, NP Inpatient   11/22/2013  3:07 PM 11/23/2013  2:39 AM DNR [id number]  Mendel Corning, MD ED   04/29/2013  5:17 PM 05/03/2013  6:15 PM Full Code [id number]  Eugenie Filler, MD Inpatient   06/15/2012  6:12 PM 06/19/2012  5:07 PM Full Code  [id number]  Bynum Bellows, MD Inpatient   07/07/2011  2:08 AM 07/12/2011  8:21 PM Full Code [id number]  Marisa Cyphers, RN ED      Other Directives:Advanced Directive and Living Will   Palliative Prophylaxis:   Aspiration, Bowel Regimen, Delirium Protocol, Frequent Pain Assessment, Oral Care and Turn Reposition  Additional Recommendations (Limitations, Scope, Preferences):  Patient and family are open to all offered and availble medical interventions to  prolong life.  They are hopeful for improvement    Psycho-social/Spiritual:  Support System: [place] Desire for further Chaplaincy support:no Additional Recommendations: Education on Hospice  Prognosis: Dependant on desire for life prolonging interventions   Discharge Planning: Home with Home Health vs home with hospice, dependant on outcomes   Chief Complaint/ Primary Diagnoses: Present on Admission:  . Acute delirium . Acute on chronic respiratory failure with hypoxia and hypercapnia (HCC) . Chronic obstructive pulmonary disease ([place]) . Encephalopathy acute . Essential hypertension . Acute respiratory failure with hypoxia ([place]) . Acute respiratory failure with hypoxia and hypercarbia ([place])  I have reviewed the medical record, interviewed the patient and family, and examined the patient. The following aspects are pertinent.  Past Medical History  Diagnosis Date  . HIP PAIN   . HYPERTENSION   . Restless leg syndrome   . COPD (chronic obstructive pulmonary disease) (HCC)     a. Multiple prior admissions for acute hypoxic respiratory failure in setting of COPD exacerbations +/- pneumonia.  . Peptic ulcer disease     a. 2013: gastric antrum.  Marland Kitchen Hyponatremia   . Tobacco abuse   . Hypomagnesemia   . LBBB (left bundle branch block)     a. Intermittent, likely rate related.  . Pulmonary HTN ([place])     a. Elevated PA pressure by prior echoes.  . Mitral regurgitation     a. Mild MR by echo 2014, not mentioned on 11/2013 (severely calcified annulus).  Marland Kitchen Respiratory failure ([place])     a. Adm 11/2013 with severe hypercarbic and hypoxemic respiratory failure requiring intubation.   . Abnormal echocardiogram     a. 11/2013: decreased EF 45-50%, moderate AS- > subsequent cath 12/02/13 with mildly calcified cors without obstruction, mild AS, EF 60%, mild pulm HTN.  Marland Kitchen Aortic stenosis     a. 11/2013: mod by echo, then mild by cath.  . Pulmonary HTN ([place])     a. 11/2013: mild by cath.  .  Wide-complex tachycardia ([place])     a. Felt likely atrial tach with rate-related LBBB.  Marland Kitchen Chronic diastolic CHF (congestive heart failure) ([place])   . Diverticulitis of colon (without mention of hemorrhage) 08/09/2013  . Septic shock ([place]) 12/11/2013  . Acute on chronic combined systolic and diastolic congestive heart failure, NYHA class 4 ([place]) 11/27/2013   Social History   Social History  . Marital Status: Widowed    Spouse Name: N/A  . Number of Children: N/A  . Years of Education: N/A   Social History Main Topics  . Smoking status: Current Every Day Smoker -- 50 years    Types: Cigarettes  . Smokeless tobacco: Never Used     Comment: 1/2 pack per day  . Alcohol Use: No  . Drug Use: No  . Sexual Activity: No   Other Topics Concern  . None   Social History Narrative   Family History  Problem Relation Age of Onset  . Heart disease Sister   . Heart disease Brother   . Dementia Mother   .  Anemia Father    Scheduled Meds: . albuterol  2.5 mg Nebulization TID  . aspirin EC  81 mg Oral QODAY  . carvedilol  6.25 mg Oral BID WC  . clonazePAM  0.5 mg Oral QHS  . divalproex  250 mg Oral Q12H  . feeding supplement (ENSURE ENLIVE)  237 mL Oral BID BM  . furosemide  40 mg Intravenous BID  . heparin  5,000 Units Subcutaneous 3 times per day  . levETIRAcetam  1,500 mg Oral BID  . lisinopril  2.5 mg Oral Daily  . methylPREDNISolone (SOLU-MEDROL) injection  40 mg Intravenous Q24H  . mometasone-formoterol  2 puff Inhalation BID  . pantoprazole  40 mg Oral Daily  . sodium chloride flush  3 mL Intravenous Q12H  . sodium chloride flush  3 mL Intravenous Q12H  . tiotropium  18 mcg Inhalation Daily   Continuous Infusions:  PRN Meds:.sodium chloride, LORazepam, morphine injection, ondansetron **OR** ondansetron (ZOFRAN) IV, ondansetron, sodium chloride flush Medications Prior to Admission:  Prior to Admission medications   Medication Sig Start Date End Date Taking? Authorizing Provider    aspirin 81 MG tablet Take 81 mg by mouth every other day.    Yes Historical Provider, MD  clonazePAM (KLONOPIN) 1 MG tablet Take 0.5 tablets (0.5 mg total) by mouth at bedtime. 09/28/15  Yes Barton Dubois, MD  diltiazem (CARDIZEM CD) 180 MG 24 hr capsule Take 1 capsule (180 mg total) by mouth at bedtime. 09/28/15  Yes Barton Dubois, MD  divalproex (DEPAKOTE) 250 MG DR tablet Take 1 tablet (250 mg total) by mouth every 12 (twelve) hours. 09/28/15  Yes Barton Dubois, MD  feeding supplement, ENSURE ENLIVE, (ENSURE ENLIVE) LIQD Take 237 mLs by mouth 2 (two) times daily between meals. 09/28/15  Yes Barton Dubois, MD  furosemide (LASIX) 20 MG tablet Take 2 tablets (40 mg total) by mouth daily. 09/28/15  Yes Barton Dubois, MD  levETIRAcetam (KEPPRA) 750 MG tablet Take 2 tablets (1,500 mg total) by mouth 2 (two) times daily. 09/28/15  Yes Barton Dubois, MD  ondansetron (ZOFRAN) 4 MG tablet Take 4 mg by mouth every 4 (four) hours as needed for nausea or vomiting.   Yes Historical Provider, MD  pantoprazole (PROTONIX) 40 MG tablet Take 1 tablet (40 mg total) by mouth daily. X 2 wks 09/28/15  Yes Barton Dubois, MD  potassium chloride SA (K-DUR,KLOR-CON) 20 MEQ tablet Take 1 tablet (20 mEq total) by mouth daily. 09/28/15  Yes Barton Dubois, MD  predniSONE (DELTASONE) 20 MG tablet Take 1 tablet by mouth daily  For 3 days; then 1/2 tablet by mouth daily X 3 days and stop prednisone 09/28/15  Yes Barton Dubois, MD  PROAIR HFA 108 (90 BASE) MCG/ACT inhaler INHALE TWO PUFFS BY MOUTH EVERY FOUR HOURS AS NEEDED Patient taking differently: INHALE TWO PUFFS BY MOUTH EVERY FOUR HOURS AS NEEDED shortness of breath/wheezing 10/31/14  Yes Laurey Morale, MD  SYMBICORT 80-4.5 MCG/ACT inhaler INHAKLE 2 PUFFS INTO THE LUNGS TWICE DAILY 09/21/15  Yes Brand Males, MD  tiotropium (SPIRIVA) 18 MCG inhalation capsule Place 1 capsule (18 mcg total) into inhaler and inhale daily. 12/03/13  Yes Erick Colace, NP  Respiratory Therapy Supplies  (FLUTTER) DEVI Use as directed 01/26/15   Tanda Rockers, MD   Allergies  Allergen Reactions  . Tetanus Toxoid Anaphylaxis  . Penicillins Hives    Has patient had a PCN reaction causing immediate rash, facial/tongue/throat swelling, SOB or lightheadedness with hypotension: unknown  Has  patient had a PCN reaction causing severe rash involving mucus membranes or skin necrosis: No  Has patient had a PCN reaction that required hospitalization: No  Has patient had a PCN reaction occurring within the last 10 years: No  If all of the above answers are "NO", then may proceed with Cephalosporin use.   . Ativan [Lorazepam] Other (See Comments)    "all benzos" -- respiratory failure 10/03/15 Tolerates clonazepam from home meds.  Tolerates lorazepam.  . Codeine Nausea And Vomiting and Other (See Comments)    Itching and faint   . Erythromycin Nausea And Vomiting  . Gabapentin     REACTION: severe edema, nausea, dizziness, disorientation  . Levaquin [Levofloxacin In D5w] Other (See Comments)    Joint pain; (states she can take it IV- not by mouth)  . Meloxicam Itching  . Methylprednisolone Hives  . Prednisone Other (See Comments)    GI bleed    Review of Systems  Respiratory: Positive for shortness of breath.     Physical Exam  Constitutional: She appears well-developed. She appears ill.  Cardiovascular: Tachycardia present.   Respiratory: She has decreased breath sounds in the right lower field and the left lower field.  Neurological: She is alert.  Skin: Skin is warm and dry.    Vital Signs: BP 121/48 mmHg  Pulse 57  Temp(Src) 98.1 F (36.7 C) (Oral)  Resp 17  Ht 5\' 1"  (1.549 m)  Wt 44.7 kg (98 lb 8.7 oz)  BMI 18.63 kg/m2  SpO2 94%  SpO2: SpO2: 94 % O2 Device:SpO2: 94 % O2 Flow Rate: .O2 Flow Rate (L/min): 2 L/min  IO: Intake/output summary:  Intake/Output Summary (Last 24 hours) at 10/06/15 1301 Last data filed at 10/06/15 0626  Gross per 24 hour  Intake  447.5 ml    Output   1290 ml  Net -842.5 ml    LBM: Last BM Date:  (PTA) Baseline Weight: Weight: 47.1 kg (103 lb 13.4 oz) Most recent weight: Weight: 44.7 kg (98 lb 8.7 oz)      Palliative Assessment/Data:    Additional Data Reviewed:  CBC:    Component Value Date/Time   WBC 11.5* 10/03/2015 0326   HGB 11.6* 10/03/2015 0326   HCT 35.7* 10/03/2015 0326   PLT 347 10/03/2015 0326   MCV 96.2 10/03/2015 0326   NEUTROABS 11.5* 10/02/2015 1237   LYMPHSABS 1.5 10/02/2015 1237   MONOABS 1.2* 10/02/2015 1237   EOSABS 0.0 10/02/2015 1237   BASOSABS 0.0 10/02/2015 1237   Comprehensive Metabolic Panel:    Component Value Date/Time   NA 134* 10/05/2015 0310   K 3.5 10/05/2015 0310   CL 78* 10/05/2015 0310   CO2 44* 10/05/2015 0310   BUN 17 10/05/2015 0310   CREATININE 0.45 10/05/2015 0310   GLUCOSE 97 10/05/2015 0310   CALCIUM 8.1* 10/05/2015 0310   AST 26 10/02/2015 1237   ALT 69* 10/02/2015 1237   ALKPHOS 64 10/02/2015 1237   BILITOT 0.6 10/02/2015 1237   PROT 6.1* 10/02/2015 1237   ALBUMIN 3.4* 10/02/2015 1237   Discussed with Dr Hartford Poli  Time In: 0930 Time Out: [id number] Time Total: 75 min Greater than 50%  of this time was spent counseling and coordinating care related to the above assessment and plan.  Signed by: Wadie Lessen, NP  Knox Royalty, NP  10/06/2015, 1:01 PM  Please contact Palliative Medicine Team phone at [phone] for questions and concerns.

## 2015-10-07 DIAGNOSIS — K5901 Slow transit constipation: Secondary | ICD-10-CM

## 2015-10-07 DIAGNOSIS — J9622 Acute and chronic respiratory failure with hypercapnia: Secondary | ICD-10-CM

## 2015-10-07 DIAGNOSIS — J9621 Acute and chronic respiratory failure with hypoxia: Secondary | ICD-10-CM

## 2015-10-07 DIAGNOSIS — Z720 Tobacco use: Secondary | ICD-10-CM

## 2015-10-07 DIAGNOSIS — I10 Essential (primary) hypertension: Secondary | ICD-10-CM

## 2015-10-07 DIAGNOSIS — J9601 Acute respiratory failure with hypoxia: Secondary | ICD-10-CM

## 2015-10-07 DIAGNOSIS — J9602 Acute respiratory failure with hypercapnia: Secondary | ICD-10-CM

## 2015-10-07 DIAGNOSIS — I5021 Acute systolic (congestive) heart failure: Secondary | ICD-10-CM

## 2015-10-07 LAB — CULTURE, BLOOD (ROUTINE X 2)
CULTURE: NO GROWTH
Culture: NO GROWTH

## 2015-10-07 MED ORDER — PREDNISONE 20 MG PO TABS
40.0000 mg | ORAL_TABLET | Freq: Every day | ORAL | Status: DC
  Administered 2015-10-08: 40 mg via ORAL
  Filled 2015-10-07: qty 2

## 2015-10-07 MED ORDER — FLEET ENEMA 7-19 GM/118ML RE ENEM
1.0000 | ENEMA | Freq: Once | RECTAL | Status: AC
  Administered 2015-10-07: 1 via RECTAL
  Filled 2015-10-07: qty 1

## 2015-10-07 MED ORDER — BISACODYL 10 MG RE SUPP
10.0000 mg | Freq: Once | RECTAL | Status: AC
  Administered 2015-10-07: 10 mg via RECTAL
  Filled 2015-10-07: qty 1

## 2015-10-07 MED ORDER — SENNOSIDES-DOCUSATE SODIUM 8.6-50 MG PO TABS
2.0000 | ORAL_TABLET | Freq: Every day | ORAL | Status: DC
  Administered 2015-10-07: 2 via ORAL
  Filled 2015-10-07: qty 2

## 2015-10-07 NOTE — Progress Notes (Signed)
PT woke up before RT left room able to take inhalers on Flowsheet.

## 2015-10-07 NOTE — Progress Notes (Signed)
Daily Progress Note   Patient Name: Jill White       Date: 10/07/2015 DOB: 30-Oct-1940  Age: 75 y.o. MRN#: [id number] Attending Physician: Debbe Odea, MD Primary Care Physician: Laurey Morale, MD Admit Date: 10/02/2015  Reason for Consultation/Follow-up: Establishing goals of care, Non pain symptom management and Psychosocial/spiritual support  Subjective:  -continued conversation with patient and her family at bedside regarding [place], EOL wishes, disposition and options.      Length of Stay: 5 days  Current Medications: Scheduled Meds:  . albuterol  2.5 mg Nebulization TID  . aspirin EC  81 mg Oral QODAY  . carvedilol  6.25 mg Oral BID WC  . clonazePAM  0.5 mg Oral QHS  . divalproex  250 mg Oral Q12H  . feeding supplement (ENSURE ENLIVE)  237 mL Oral BID BM  . furosemide  40 mg Intravenous BID  . heparin  5,000 Units Subcutaneous 3 times per day  . levETIRAcetam  1,500 mg Oral BID  . lisinopril  2.5 mg Oral Daily  . methylPREDNISolone (SOLU-MEDROL) injection  40 mg Intravenous Q24H  . mometasone-formoterol  2 puff Inhalation BID  . pantoprazole  40 mg Oral Daily  . senna-docusate  2 tablet Oral QHS  . sodium chloride flush  3 mL Intravenous Q12H  . sodium chloride flush  3 mL Intravenous Q12H  . sodium phosphate  1 enema Rectal Once  . tiotropium  18 mcg Inhalation Daily    Continuous Infusions:    PRN Meds: sodium chloride, LORazepam, morphine injection, ondansetron **OR** ondansetron (ZOFRAN) IV, ondansetron, sodium chloride flush  Physical Exam: Physical Exam  Constitutional: She appears lethargic. She appears ill.  Cardiovascular: Tachycardia present.   Pulmonary/Chest: She has decreased breath sounds in the right lower field and the left lower field.    Genitourinary:  Noted foley for clear yellow urine  Neurological: She appears lethargic.                Vital Signs: BP 88/46 mmHg  Pulse 57  Temp(Src) 97.6 F (36.4 C) (Oral)  Resp 18  Ht 5\' 4"  (1.626 m)  Wt 46.131 kg (101 lb 11.2 oz)  BMI 17.45 kg/m2  SpO2 91% SpO2: SpO2: 91 % O2 Device: O2 Device: Nasal Cannula O2 Flow Rate: O2 Flow Rate (L/min): 2 L/min  Intake/output summary:  Intake/Output Summary (Last 24 hours) at 10/07/15 1454 Last data filed at 10/07/15 1312  Gross per 24 hour  Intake    240 ml  Output   1200 ml  Net   -960 ml   LBM: Last BM Date: 10/07/15 Baseline Weight: Weight: 47.1 kg (103 lb 13.4 oz) Most recent weight: Weight: 46.131 kg (101 lb 11.2 oz)       Palliative Assessment/Data:   Additional Data Reviewed: CBC    Component Value Date/Time   WBC 11.5* 10/03/2015 0326   RBC 3.71* 10/03/2015 0326   HGB 11.6* 10/03/2015 0326   HCT 35.7* 10/03/2015 0326   PLT 347 10/03/2015 0326   MCV 96.2 10/03/2015 0326   MCH 31.3 10/03/2015 0326   MCHC 32.5 10/03/2015 0326   RDW 13.9 10/03/2015 0326   LYMPHSABS 1.5 10/02/2015 1237   MONOABS 1.2* 10/02/2015 1237   EOSABS 0.0 10/02/2015 1237   BASOSABS 0.0 10/02/2015 1237    CMP     Component Value Date/Time   NA 134* 10/05/2015 0310   K 3.5 10/05/2015 0310   CL 78* 10/05/2015 0310   CO2 44* 10/05/2015 0310   GLUCOSE 97 10/05/2015 0310   BUN 17 10/05/2015 0310   CREATININE 0.45 10/05/2015 0310   CALCIUM 8.1* 10/05/2015 0310   PROT 6.1* 10/02/2015 1237   ALBUMIN 3.4* 10/02/2015 1237   AST 26 10/02/2015 1237   ALT 69* 10/02/2015 1237   ALKPHOS 64 10/02/2015 1237   BILITOT 0.6 10/02/2015 1237   GFRNONAA >60 10/05/2015 0310   GFRAA >60 10/05/2015 0310       Problem List:  Patient Active Problem List   Diagnosis Date Noted  . DNR (do not resuscitate) discussion   . Acute systolic heart failure ([place]) 10/02/2015  . Acute respiratory failure with hypoxia ([place]) 10/02/2015  . Acute  respiratory failure with hypoxia and hypercarbia ([place]) 10/02/2015  . Aortic stenosis   . Chronic diastolic heart failure ([place])   . Encounter for palliative care   . Goals of care, counseling/discussion   . Seizure ([place])   . Acute delirium   . Protein-calorie malnutrition, severe 09/22/2015  . Seizures ([place])   . Acute on chronic respiratory failure with hypoxia and hypercapnia (HCC)   . Encephalopathy acute   . (HFpEF) heart failure with preserved ejection fraction ([place])   . HCAP (healthcare-associated pneumonia) 09/16/2015  . Chronic obstructive pulmonary disease ([place])   . SOB (shortness of breath)   . Acute on chronic respiratory failure ([place]) 09/13/2015  . Acute respiratory failure ([place]) 09/13/2015  . Arthritis of right shoulder region 09/03/2015  . Chronic obstructive pulmonary disease, unspecified COPD, unspecified chronic bronchitis type 05/18/2015  . Acute sinusitis 05/18/2015  . History of panic attacks 02/19/2015  . 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 Cachexia ([place]) 07/23/2014  . Smoking 07/23/2014  . Chronic respiratory failure with hypercapnia ([place]) 07/12/2014  . CN (constipation)   . Restless leg syndrome   . Palliative care encounter   . Compression fracture of thoracic spine, non-traumatic ([place])   . Back pain 05/12/2014  . Chest pain 05/12/2014  . Compression deformity of vertebra 05/12/2014  . LBBB (left bundle branch block) 05/12/2014  . Thoracic spine fracture ([place]) 05/12/2014  . Non-traumatic compression fracture of T6 thoracic vertebra 05/12/2014  . 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 COLD (chronic obstructive lung disease) ([place]) 01/24/2014  . Mild aortic stenosis 11/27/2013  .  CAP (community acquired pneumonia) 04/29/2013  . Dehydration with hyponatremia 06/15/2012  . COPD exacerbation ([place]) 06/15/2012  . Duodenal ulcer 07/12/2011  . Hyponatremia 07/06/2011  . Abdominal pain  07/06/2011  . RLS (restless legs syndrome) 07/06/2011  . Cigarette smoker 07/06/2011  . COPD (chronic obstructive pulmonary disease) ([place]) 07/06/2011  . HIP PAIN 03/06/2007  . Essential hypertension 02/09/2007     Palliative Care Assessment & Plan    1.Code Status:  Full code    Code Status Orders        Start     Ordered   10/06/15 1258  Full code   Continuous     10/06/15 1301    Code Status History    Date Active Date Inactive Code Status Order ID Comments User Context   10/05/2015  8:56 AM 10/06/2015  1:01 PM DNR [id number]  Verlee Monte, MD Inpatient   10/02/2015  5:53 PM 10/05/2015  8:56 AM Full Code [id number]  Charlynne Cousins, MD Inpatient   09/13/2015  9:52 PM 09/28/2015  6:48 PM Full Code [id number]  Rise Patience, MD Inpatient   02/01/2015 10:26 PM 02/06/2015  7:40 PM Full Code [id number]  Etta Quill, DO ED   05/12/2014  7:57 PM 05/22/2014  7:22 PM Full Code [id number]  Waldemar Dickens, MD ED   12/02/2013  3:09 PM 12/03/2013  7:19 PM Full Code [id number]  [name] Mocha, MD Inpatient   12/01/2013  9:04 AM 12/02/2013  3:09 PM Full Code [id number]  Jacolyn Reedy, MD Inpatient   11/28/2013  9:20 AM 12/01/2013  9:04 AM Partial Code [id number]  Marijean Heath, NP Inpatient   11/23/2013  2:39 AM 11/28/2013  9:20 AM Full Code [id number]  Samella Parr, NP Inpatient   11/22/2013  3:07 PM 11/23/2013  2:39 AM DNR [id number]  Mendel Corning, MD ED   04/29/2013  5:17 PM 05/03/2013  6:15 PM Full Code [id number]  Eugenie Filler, MD Inpatient   06/15/2012  6:12 PM 06/19/2012  5:07 PM Full Code [id number]  Bynum Bellows, MD Inpatient   07/07/2011  2:08 AM 07/12/2011  8:21 PM Full Code [id number]  Marisa Cyphers, RN ED        Goals of Care/Additional Recommendations:   Home with hospices services   Limitations on Scope of Treatment: Avoid Hospitalization   Psycho-social Needs: Education on Hospice   Symptom Management:      1. Constipation: Fleets enema now                                  Senna-s- two tablets qhs   Palliative Prophylaxis:   Bowel Regimen, Delirium Protocol, Frequent Pain Assessment and Oral Care   Prognosis:  Less than 6 months  . Discharge Planning:  Home with Hospice, will write for choice     Thank you for allowing the Palliative Medicine Team to assist in the care of this patient.   Time In: 1435 Time Out: 1500 Total Time 25 min Prolonged Time Billed  no         Knox Royalty, NP  10/07/2015, 2:54 PM  Please contact Palliative Medicine Team phone at [phone] for questions and concerns.

## 2015-10-07 NOTE — Progress Notes (Signed)
PROGRESS NOTE    Jill White  [id number] DOB: Nov 19, 1940 DOA: 10/02/2015  PCP: Laurey Morale, MD  Outpatient Specialists: Rafael Bihari, [facility] cardiology    Brief Narrative:  This is a 75 year old female with severe COPD, oxygen dependent and has chronic CO2 retention came into the hospital with confusion and lethargy. She was recently in the hospital discharge after she was admitted for a COPD exacerbation and returned after 4 days. admission her ABGs showed an high PCO2 which was above reportable range. She was treated with BiPAP  Assessment & Plan:   Principal Problem:   Acute on chronic respiratory failure with hypoxia and hypercapnia (A)COPD Gold Stage 2 - treated with BiPAP and IV steroids- wean off Solumedrol today and start Prednisone tomorrow - cont Dulera, Spiriva  (B) Acute systolic CHF - EF 45 % - diuresing with IV Lasix- down to 2 L O2  (C) atelectasis - IS   Acute encephlopathy - recurrent episodes due to hypercarbia    Essential hypertension - holding ACE while diuresing    Cigarette smoker - discussed with her today to stop smoking    DVT prophylaxis: Heparin Code Status: Full code Family Communication: granddaughter and daughter at bedside Disposition Plan:  Home with hospice   Consultants:   Palliative care  Procedures:     Antimicrobials:  Anti-infectives    None      Subjective: No complaints of dyspnea or cough. No nausea or vomiting.   Objective: Filed Vitals:   10/07/15 0647 10/07/15 0906 10/07/15 0922 10/07/15 1312  BP: 112/54   88/46  Pulse: 63   57  Temp:    97.6 F (36.4 C)  TempSrc:    Oral  Resp:    18  Height:      Weight:      SpO2:  85% 91%     Intake/Output Summary (Last 24 hours) at 10/07/15 1704 Last data filed at 10/07/15 1312  Gross per 24 hour  Intake    240 ml  Output   1200 ml  Net   -960 ml   Filed Weights   10/04/15 0500 10/05/15 0500 10/07/15 0500  Weight: 45.7 kg (100 lb 12 oz) 44.7 kg  (98 lb 8.7 oz) 46.131 kg (101 lb 11.2 oz)    Examination: General exam: Appears calm and comfortable  Respiratory system: Clear to auscultation. Respiratory effort normal. Cardiovascular system: S1 & S2 heard, RRR. No JVD, murmurs, rubs, gallops or clicks. No pedal edema. Gastrointestinal system: Abdomen is nondistended, soft and nontender. No organomegaly or masses felt. Normal bowel sounds heard. Central nervous system: Alert and oriented. No focal neurological deficits. Extremities: Symmetric 5 x 5 power. Skin: No rashes, lesions or ulcers Psychiatry:  . Mood & affect appropriate.     Data Reviewed: I have personally reviewed following labs and imaging studies  CBC:  Recent Labs Lab 10/02/15 1237 10/03/15 0326  WBC 14.3* 11.5*  NEUTROABS 11.5*  --   HGB 12.6 11.6*  HCT 40.8 35.7*  MCV 100.7* 96.2  PLT 355 [id number]   Basic Metabolic Panel:  Recent Labs Lab 10/02/15 1237 10/03/15 0326 10/04/15 0329 10/05/15 0310  NA 133* 139 136 134*  K 3.6 3.0* 3.5 3.5  CL 73* 71* 71* 78*  CO2 49* >50* >50* 44*  GLUCOSE 129* 114* 104* 97  BUN 12 11 17 17   CREATININE 0.47 0.47 0.47 0.45  CALCIUM 7.9* 7.4* 7.9* 8.1*   GFR: Estimated Creatinine Clearance: 43.5 mL/min (by C-G formula based  on Cr of 0.45). Liver Function Tests:  Recent Labs Lab 10/02/15 1237  AST 26  ALT 69*  ALKPHOS 64  BILITOT 0.6  PROT 6.1*  ALBUMIN 3.4*   No results for input(s): LIPASE, AMYLASE in the last 168 hours. No results for input(s): AMMONIA in the last 168 hours. Coagulation Profile: No results for input(s): INR, PROTIME in the last 168 hours. Cardiac Enzymes: No results for input(s): CKTOTAL, CKMB, CKMBINDEX, TROPONINI in the last 168 hours. BNP (last 3 results) No results for input(s): PROBNP in the last 8760 hours. HbA1C: No results for input(s): HGBA1C in the last 72 hours. CBG: No results for input(s): GLUCAP in the last 168 hours. Lipid Profile: No results for input(s): CHOL, HDL,  LDLCALC, TRIG, CHOLHDL, LDLDIRECT in the last 72 hours. Thyroid Function Tests: No results for input(s): TSH, T4TOTAL, FREET4, T3FREE, THYROIDAB in the last 72 hours. Anemia Panel: No results for input(s): VITAMINB12, FOLATE, FERRITIN, TIBC, IRON, RETICCTPCT in the last 72 hours. Urine analysis:    Component Value Date/Time   COLORURINE YELLOW 10/02/2015 [place] 10/02/2015 1417   LABSPEC 1.011 10/02/2015 1417   PHURINE 5.5 10/02/2015 1417   GLUCOSEU NEGATIVE 10/02/2015 1417   HGBUR NEGATIVE 10/02/2015 1417   BILIRUBINUR NEGATIVE 10/02/2015 1417   BILIRUBINUR n 11/04/2010   KETONESUR NEGATIVE 10/02/2015 1417   PROTEINUR NEGATIVE 10/02/2015 1417   PROTEINUR n 11/04/2010   UROBILINOGEN 1.0 05/20/2014 0632   UROBILINOGEN 0.2 11/04/2010   NITRITE NEGATIVE 10/02/2015 1417   NITRITE n 11/04/2010   LEUKOCYTESUR NEGATIVE 10/02/2015 1417   Sepsis Labs: @LABRCNTIP (procalcitonin:4,lacticidven:4)  ) Recent Results (from the past 240 hour(s))  Blood Culture (routine x 2)     Status: None   Collection Time: 10/02/15 12:28 PM  Result Value Ref Range Status   Specimen Description BLOOD RIGHT WRIST  Final   Special Requests IN PEDIATRIC BOTTLE 3CC  Final   Culture   Final    NO GROWTH 5 DAYS Performed at [facility]    Report Status 10/07/2015 FINAL  Final  Blood Culture (routine x 2)     Status: None   Collection Time: 10/02/15 12:47 PM  Result Value Ref Range Status   Specimen Description BLOOD RIGHT ANTECUBITAL  Final   Special Requests BOTTLES DRAWN AEROBIC AND ANAEROBIC 3.5 CC  Final   Culture   Final    NO GROWTH 5 DAYS Performed at [facility]    Report Status 10/07/2015 FINAL  Final  Urine culture     Status: None   Collection Time: 10/02/15  2:17 PM  Result Value Ref Range Status   Specimen Description URINE, CATHETERIZED  Final   Special Requests NONE  Final   Culture   Final    NO GROWTH 2 DAYS Performed at [facility]     Report Status 10/04/2015 FINAL  Final         Radiology Studies: No results found.      Scheduled Meds: . albuterol  2.5 mg Nebulization TID  . aspirin EC  81 mg Oral QODAY  . carvedilol  6.25 mg Oral BID WC  . clonazePAM  0.5 mg Oral QHS  . divalproex  250 mg Oral Q12H  . feeding supplement (ENSURE ENLIVE)  237 mL Oral BID BM  . furosemide  40 mg Intravenous BID  . heparin  5,000 Units Subcutaneous 3 times per day  . levETIRAcetam  1,500 mg Oral BID  . lisinopril  2.5  mg Oral Daily  . methylPREDNISolone (SOLU-MEDROL) injection  40 mg Intravenous Q24H  . mometasone-formoterol  2 puff Inhalation BID  . pantoprazole  40 mg Oral Daily  . senna-docusate  2 tablet Oral QHS  . sodium chloride flush  3 mL Intravenous Q12H  . sodium chloride flush  3 mL Intravenous Q12H  . tiotropium  18 mcg Inhalation Daily   Continuous Infusions:    LOS: 5 days    Time spent in minutes: 21 min    Tomorrow Dehaas, MD [facility] Pager: [email or web address] Password [facility] 10/07/2015, 5:04 PM

## 2015-10-07 NOTE — Care Management Important Message (Signed)
Important Message  Patient Details  Name: JOORY ALWOOD MRN: [id number] Date of Birth: Sep 02, 1940   Medicare Important Message Given:  Yes    Camillo Flaming 10/07/2015, 9:16 AMImportant Message  Patient Details  Name: KWYNN MCCULLICK MRN: [id number] Date of Birth: [birth year]   Medicare Important Message Given:  Yes    Camillo Flaming 10/07/2015, 9:16 AM

## 2015-10-07 NOTE — Progress Notes (Signed)
PT unable to take Spiriva and Dulera at this time will attempt again later this morning.

## 2015-10-07 NOTE — Progress Notes (Signed)
Nutrition Brief Note  Attempted to see patient today, when RN stated "this is probably not a good time."   She spoke to patient who stated she did not want to speak with me today.  Patient is currently suffering from constipation and nausea; overall not feeling well. Will attempt to see patient 4/20.  Jill White. Murl Golladay, MS, RD LDN After Hours/Weekend Pager [phone]

## 2015-10-08 ENCOUNTER — Telehealth: Payer: Self-pay | Admitting: Internal Medicine

## 2015-10-08 ENCOUNTER — Telehealth: Payer: Self-pay | Admitting: Family Medicine

## 2015-10-08 DIAGNOSIS — J441 Chronic obstructive pulmonary disease with (acute) exacerbation: Secondary | ICD-10-CM

## 2015-10-08 DIAGNOSIS — I350 Nonrheumatic aortic (valve) stenosis: Secondary | ICD-10-CM

## 2015-10-08 DIAGNOSIS — K5901 Slow transit constipation: Secondary | ICD-10-CM | POA: Insufficient documentation

## 2015-10-08 DIAGNOSIS — G9340 Encephalopathy, unspecified: Secondary | ICD-10-CM

## 2015-10-08 MED ORDER — PREDNISONE 10 MG PO TABS: ORAL_TABLET | ORAL | Status: AC

## 2015-10-08 MED ORDER — CARVEDILOL 3.125 MG PO TABS: 3.1250 mg | ORAL_TABLET | Freq: Two times a day (BID) | ORAL | Status: AC

## 2015-10-08 MED ORDER — LISINOPRIL 2.5 MG PO TABS: 2.5000 mg | ORAL_TABLET | Freq: Every day | ORAL | Status: AC

## 2015-10-08 MED ORDER — SENNOSIDES-DOCUSATE SODIUM 8.6-50 MG PO TABS: 2.0000 | ORAL_TABLET | Freq: Every day | ORAL | Status: AC

## 2015-10-08 NOTE — Telephone Encounter (Signed)
Per Dr. Sarajane Jews okay to give verbal order and I did speak with Vickie.

## 2015-10-08 NOTE — Care Management Note (Signed)
Case Management Note  Patient Details  Name: Jill White MRN: [id number] Date of Birth: 02-10-1941  Subjective/Objective: Per [facility] office-Vickie-Stacey Krystal Eaton has accepted patient for home hospice services, [facility] will manage delivery of DME to patient's home, & set up home visit.                   Action/Plan:d/c home.   Expected Discharge Date:   (UNKNOWN)               Expected Discharge Plan:  Home w Hospice Care  In-House Referral:     Discharge planning Services  CM Consult  Post Acute Care Choice:    Choice offered to:  [facility] POA / Guardian  DME Arranged:    DME Agency:     HH Arranged:  RN [place] Agency:  [facility]  Status of Service:  Completed, signed off  Medicare Important Message Given:  Yes Date Medicare IM Given:    Medicare IM give by:    Date Additional Medicare IM Given:    Additional Medicare Important Message give by:     If discussed at [place] of Stay Meetings, dates discussed:    Additional Comments:  Dessa Phi, RN 10/08/2015, 11:46 AM

## 2015-10-08 NOTE — Telephone Encounter (Signed)
Spoke with Jocelyn Lamer. Pt's diagnosis is Chronic Respiratory Failure. Benjamine Sprague that MR will be attending MD.

## 2015-10-08 NOTE — Telephone Encounter (Signed)
Jill White from [place] [phone]) needs to know if Dr. Sarajane Jews will be the attending doctor for her and Jill White can take a verbal also.

## 2015-10-08 NOTE — Telephone Encounter (Signed)
Spoke with Jocelyn Lamer at [facility] and [place]. Pt is being discharged from the hospital today into Hospice care. Jocelyn Lamer needs to know if MR will be the attending MD.  MR - please advise. Thanks.

## 2015-10-08 NOTE — Care Management Note (Signed)
Case Management Note  Patient Details  Name: Jill White MRN: [id number] Date of Birth: December 27, 1940  Subjective/Objective:  [facility] chosen by dtr. TC [facility] office-Vickie has referral-she will contact rep to assess-provided w/dtr's c#[phone], aware of d/c today w/DME-hospital bed,overlay gel mattress,3n1,shower chair.Will need pcp to follow.                 Action/Plan:d/c plan home w/home hospice.   Expected Discharge Date:   (UNKNOWN)               Expected Discharge Plan:  Home w Hospice Care  In-House Referral:     Discharge planning Services  CM Consult  Post Acute Care Choice:    Choice offered to:  [facility] POA / Guardian  DME Arranged:    DME Agency:     HH Arranged:    [place] Agency:     Status of Service:  In process, will continue to follow  Medicare Important Message Given:  Yes Date Medicare IM Given:    Medicare IM give by:    Date Additional Medicare IM Given:    Additional Medicare Important Message give by:     If discussed at [place] of Stay Meetings, dates discussed:    Additional Comments:  Dessa Phi, RN 10/08/2015, 9:51 AM

## 2015-10-08 NOTE — Telephone Encounter (Signed)
I can be hospice attending if hospice diagnosis is copd/resp failure

## 2015-10-08 NOTE — Consult Note (Signed)
   [facility] CM Inpatient Consult   10/08/2015  Jill White [birth year] [id number]    Patient screened for potential [facility] Care Management services. Chart reviewed. Noted discharge plan is for home with hospice.  There are no identifiable [facility] Care Management needs at this time. If patient's post hospital needs change, please place a [facility] Care Management consult. For questions please contact:  [name], [place], RN,BSN [facility] Liaison [phone]

## 2015-10-08 NOTE — Progress Notes (Addendum)
Patient discharge home with grand-daughter, F/U home hospice at home., patient denies any pain/distress. Skin tear on the RFA patient was admitted with, no sign of infection noted, redressed prior to discharge and scattered bruises. Accompanied home by grand-daughter, transported to the car by staff via wheelchair.  Grand-daughter wants to make appointment herself after meeting with hospice.

## 2015-10-08 NOTE — Care Management Note (Signed)
Case Management Note  Patient Details  Name: Jill White MRN: [id number] Date of Birth: [birth year]  Subjective/Objective: Referral for home w/hospice choice. Spoke to dtr Janett Billow yesterday-she will choose agency this am.Will need pcp to follow. Will need hospital bed,3n1,shower chair. Already have home 02-[facility]. Family will transport home on own.                   Action/Plan:d/c plan home w/hospice.   Expected Discharge Date:   (UNKNOWN)               Expected Discharge Plan:  Home w Hospice Care  In-House Referral:     Discharge planning Services  CM Consult  Post Acute Care Choice:    Choice offered to:  [facility] POA / Guardian  DME Arranged:    DME Agency:     HH Arranged:    [place] Agency:     Status of Service:  In process, will continue to follow  Medicare Important Message Given:  Yes Date Medicare IM Given:    Medicare IM give by:    Date Additional Medicare IM Given:    Additional Medicare Important Message give by:     If discussed at [place] of Stay Meetings, dates discussed:    Additional Comments:  Dessa Phi, RN 10/08/2015, 9:01 AM

## 2015-10-09 ENCOUNTER — Other Ambulatory Visit: Payer: Self-pay | Admitting: Family Medicine

## 2015-10-09 DIAGNOSIS — J188 Other pneumonia, unspecified organism: Secondary | ICD-10-CM | POA: Diagnosis not present

## 2015-10-09 DIAGNOSIS — J441 Chronic obstructive pulmonary disease with (acute) exacerbation: Secondary | ICD-10-CM | POA: Diagnosis not present

## 2015-10-09 DIAGNOSIS — J440 Chronic obstructive pulmonary disease with acute lower respiratory infection: Secondary | ICD-10-CM

## 2015-10-09 DIAGNOSIS — M8468XD Pathological fracture in other disease, other site, subsequent encounter for fracture with routine healing: Secondary | ICD-10-CM | POA: Diagnosis not present

## 2015-10-09 NOTE — Discharge Summary (Signed)
Physician Discharge Summary  Jill White [id number] DOB: 05-02-1941 DOA: 10/02/2015  PCP: Laurey Morale, MD  Admit date: 10/02/2015 Discharge date: 10/09/2015  Time spent: 60 minutes  Recommendations for Outpatient Follow-up:  1. 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 She was recently in the hospital for a COPD exacerbation and returned after 4 days of being discharged.  Her admission her ABGs showed a PCO2 which was above reportable range. She was treated with BiPAP.  Hospital Course:  Principal Problem:  Acute on chronic respiratory failure with hypoxia and hypercapnia  (A)COPD Gold Stage 2 when last checked - treated with BiPAP and IV steroids- weaned off Solumedrol to Prednisone  - cont Dulera, Spiriva - have asked her to f/u with pulm in 1 wk- appt has been made  (B) Acute systolic CHF/ Mod AS - EF 45 %- 50%  - diuresed with IV Lasix- down to 2 L O2 - added Coreg to ACE I- cont Lisinopril  (C) atelectasis - IS   Due to recurrent admission for respiratory issues, Palliative care was consulted. She will go home with hospice but wished to remain a full code- she has changed her code status a number of times. I have strictly advised her to quit smoking. She is requiring 2 L O2- her granddaughter who takes care of her already is aware to keep pulse ox at 88-92%.   Acute encephlopathy - recurrent  episodes due to hypercarbia   Essential hypertension - holding ACE I while diuresing- will resume today   Cigarette smoker - discussed with her today to stop smoking   Procedures ECHO Study Conclusions  - Left ventricle: The cavity size was mildly dilated. Systolic  function was mildly reduced. The estimated ejection fraction was  in the range of 45% to 50%. Diffuse hypokinesis. - Aortic valve: Consider dobutamine study to r/o severe AS given  morphology and low EF There was moderate stenosis. Valve area  (VTI): 0.54 cm^2. Valve area (Vmax): 0.65 cm^2. Valve area  (Vmean): 0.59 cm^2. - Mitral valve: Exuberant MAC extending into sub valvular apparatus  previously described There was mild regurgitation. - Left atrium: The atrium was severely dilated. - Atrial septum: No defect or patent foramen ovale was identified. - Pulmonary arteries: PA peak pressure: 62 mm Hg (S).  Consultations:  Palliative care  Discharge Exam: Filed Weights   10/05/15 0500 10/07/15 0500 10/08/15 0450  Weight: 44.7 kg (98 lb 8.7 oz) 46.131 kg (101 lb 11.2 oz) 46.1 kg (101 lb 10.1 oz)   Filed Vitals:   10/08/15 0450 10/08/15 0945  BP: 95/48 88/51  Pulse: 60   Temp: 97.5 F (36.4 C)   Resp: 16     General: AAO x 3, no distress Cardiovascular: RRR, no murmurs  Respiratory: clear to auscultation bilaterally GI: soft, non-tender, non-distended, bowel sound positive  Discharge Instructions You were cared for by a hospitalist during your hospital stay. If you  have any questions about your discharge medications or the care you received while you were in the hospital after you are discharged, you can call the unit and asked to speak with the hospitalist on call if the hospitalist that took care of you is not available. Once you are discharged, your primary care physician will handle any further medical issues. Please note that NO REFILLS for any discharge medications will be authorized once you  are discharged, as it is imperative that you return to your primary care physician (or establish a relationship with a primary care physician if you do not have one) for your aftercare needs so that they can reassess your need for medications and monitor your lab values.      Discharge Instructions    Diet - low sodium heart healthy    Complete by:  As directed      Increase activity slowly    Complete by:  As directed             Medication List    STOP taking these medications        diltiazem 180 MG 24 hr capsule  Commonly known as:  CARDIZEM CD         TAKE these medications        aspirin 81 MG tablet  Take 81 mg by mouth every other day.     carvedilol 3.125 MG tablet  Commonly known as:  COREG  Take 1 tablet (3.125 mg total) by mouth 2 (two) times daily with a meal. Hold if SBP < 100     clonazePAM 1 MG tablet  Commonly known as:  KLONOPIN  Take 0.5 tablets (0.5 mg total) by mouth at bedtime.     divalproex 250 MG DR tablet  Commonly known as:  DEPAKOTE  Take 1 tablet (250 mg total) by mouth every 12 (twelve) hours.     feeding supplement (ENSURE ENLIVE) Liqd  Take 237 mLs by mouth 2 (two) times daily between meals.     FLUTTER Devi  Use as directed     furosemide 20 MG tablet  Commonly known as:  LASIX  Take 2 tablets (40 mg total) by mouth daily.     levETIRAcetam 750 MG tablet  Commonly known as:  KEPPRA  Take 2 tablets (1,500 mg total) by mouth 2 (two) times daily.     lisinopril 2.5 MG tablet  Commonly known as:  PRINIVIL,ZESTRIL  Take 1 tablet (2.5 mg total) by mouth daily. Hold if SBP < 90     ondansetron 4 MG tablet  Commonly known as:  ZOFRAN  Take 4 mg by mouth every 4 (four) hours as needed for nausea or vomiting.     pantoprazole 40 MG tablet  Commonly known as:  PROTONIX  Take 1 tablet (40 mg total) by mouth daily. X 2 wks     potassium chloride SA 20 MEQ tablet  Commonly known as:  K-DUR,KLOR-CON  Take 1 tablet (20 mEq total) by  mouth daily.     predniSONE 10 MG tablet  Commonly known as:  DELTASONE  30 mg tomorrow, 20mg  the next day, 10 mg the next day and 5 mg the last day PROAIR HFA 108 (90 Base) MCG/ACT inhaler  Generic drug:  albuterol       senna-docusate 8.6-50 MG tablet  Commonly known as:  Senokot-S  Take 2 tablets by mouth at bedtime. Hold for diarrhea     SYMBICORT 80-4.5 MCG/ACT inhaler  Generic  drug:  budesonide-formoterol  INHAKLE 2 PUFFS INTO THE LUNGS TWICE DAILY     tiotropium 18 MCG inhalation capsule  Commonly known as:  SPIRIVA  Place 1 capsule (18 mcg total) into inhaler and inhale daily.       Allergies  Allergen Reactions  . Tetanus Toxoid Anaphylaxis  . Penicillins Hives    Has patient had a PCN reaction causing immediate rash, facial/tongue/throat swelling, SOB or lightheadedness with hypotension: unknown  Has patient had a PCN reaction causing severe rash involving mucus membranes or skin necrosis: No  Has patient had a PCN reaction that required hospitalization: No  Has patient had a PCN reaction occurring within the last 10 years: No  If all of the above answers are "NO", then may proceed with Cephalosporin use.   . Ativan [Lorazepam] Other (See Comments)    "all benzos" -- respiratory failure 10/03/15 Tolerates clonazepam from home meds.  Tolerates lorazepam.  . Codeine Nausea And Vomiting and Other (See Comments)    Itching and faint   . Erythromycin Nausea And Vomiting  . Gabapentin     REACTION: severe edema, nausea, dizziness, disorientation  . Levaquin [Levofloxacin In D5w] Other (See Comments)    Joint pain; (states she can take it IV- not by mouth)  . Meloxicam Itching  . Methylprednisolone Hives  . Prednisone Other (See Comments)    GI bleed   Follow-up Information    Follow up with Magdalen Spatz, NP. Go on 10/15/2015.   Specialty:  Pulmonary Disease   Why:  at 10:00am  For Post Hospitalization Follow Up   Contact information:   [name]  [place] [address] [phone]       Follow up with Hospice at [facility]   Specialty:  Hospice and Palliative Medicine   Why:  home nurse visit.   Contact information:   [place] [id number] [phone]        The results of significant diagnostics from this hospitalization (including imaging, microbiology, ancillary and laboratory) are listed below for reference.    Significant Diagnostic Studies: Dg Shoulder Right  09/15/2015  CLINICAL DATA:  Chronic right shoulder pain.  No known injury. EXAM: RIGHT SHOULDER - 2+ VIEW COMPARISON:  Chest CT 09/13/2015. FINDINGS: Moderate AC joint degenerative changes. Mild glenohumeral joint degenerative changes. No acute bony abnormality or worrisome bone lesion. The visualize right ribs are intact. The visualize right lung is clear. Stable emphysematous changes and pulmonary scarring. IMPRESSION: AC joint and glenohumeral joint degenerative changes but no acute bony findings. Electronically Signed   By: Marijo Sanes M.D.   On: 09/15/2015 14:12   Ct Head Wo Contrast  10/02/2015  CLINICAL DATA:  75 year old female with increasing shortness of breath and confusion for 2 days. Wheezing and rales. EXAM: CT HEAD WITHOUT CONTRAST TECHNIQUE: Contiguous axial images were obtained from the base of the skull through the vertex without intravenous contrast. COMPARISON:  Brain MRI 09/23/2015. FINDINGS: Patchy areas of mild decreased attenuation are noted throughout the deep and periventricular white matter of the cerebral hemispheres bilaterally, compatible with mild chronic microvascular ischemic disease. No acute intracranial abnormalities. Specifically, no evidence of acute intracranial hemorrhage, no definite findings of acute/subacute cerebral ischemia, no mass, mass effect, hydrocephalus or abnormal intra or extra-axial fluid collections. Visualized paranasal sinuses and mastoids are well pneumatized, with exception of a small  air-fluid level in dependent portion of the left maxillary sinus (which is incompletely visualized). No acute displaced skull fractures  are identified. IMPRESSION: 1. No acute intracranial abnormalities. 2. Mild chronic microvascular ischemic changes in the cerebral white matter. 3. Small air-fluid level in the left maxillary sinus which is incompletely visualized. Clinical correlation for signs and symptoms of sinusitis is suggested. Electronically Signed   By: Vinnie Langton M.D.   On: 10/02/2015 14:41   Ct Angio Chest Pe W/cm &/or Wo Cm  09/13/2015  CLINICAL DATA:  75 year old female with shortness of breath and upper back pain. EXAM: CT ANGIOGRAPHY CHEST WITH CONTRAST TECHNIQUE: Multidetector CT imaging of the chest was performed using the standard protocol during bolus administration of intravenous contrast. Multiplanar CT image reconstructions and MIPs were obtained to evaluate the vascular anatomy. CONTRAST:  15mL OMNIPAQUE IOHEXOL 350 MG/ML SOLN COMPARISON:  Chest radiograph dated 09/13/2015 FINDINGS: There is emphysematous changes of the lungs. No focal consolidation, pleural effusion, or pneumothorax. The central airways are patent. There is atherosclerotic calcification of the thoracic aorta. No CT evidence of pulmonary embolism. There is no cardiomegaly or pericardial effusion. There is coronary vascular calcification. There is no hilar or mediastinal adenopathy. The esophagus and the thyroid gland appear unremarkable. There is no axillary adenopathy. The chest wall soft tissues appear unremarkable. There is osteopenia with degenerative changes of the spine. C6 compression fracture with anterior wedging, likely old. There is T8 compression fracture with mild sclerotic changes, age indeterminate, likely old. Clinical correlation is recommended. The visualized upper abdomen appears unremarkable. Review of the MIP images confirms the above findings. IMPRESSION: No CT evidence of pulmonary embolism.  Osteopenia with multilevel compression fractures of the thoracic spine, age indeterminate, likely old. Clinical correlation is recommended. Electronically Signed   By: Anner Crete M.D.   On: 09/13/2015 23:43   Mr Brain Wo Contrast  09/23/2015  CLINICAL DATA:  Seizure. Patient originally admitted for aspirate distress. Had seizure in ICU. EXAM: MRI HEAD WITHOUT CONTRAST TECHNIQUE: Multiplanar, multiecho pulse sequences of the brain and surrounding structures were obtained without intravenous contrast. COMPARISON:  None. FINDINGS: No acute infarct, hemorrhage, or mass lesion is present. The ventricles are of normal size. No significant extraaxial fluid collection is present. Mild periventricular and subcortical white matter changes are present bilaterally. Matter changes extend into the brainstem. The cerebellum is unremarkable. The auditory canals are within normal limits bilaterally. Flow is present the major intracranial arteries. Bilateral lens replacements are noted. A fluid level is present in the left maxillary sinus. The remaining paranasal sinuses are clear. There is fluid in the right mastoid air cells. No obstructing nasopharyngeal lesion is evident. Dedicated imaging the temporal lobes demonstrate symmetric size and signal of the hippocampal structures. No focal or acute abnormality is present to correlate with seizure activity. Skullbase is within normal limits. Midline sagittal images are. Degenerative changes are noted in the upper cervical spine. IMPRESSION: 1. No acute or focal abnormality to account for seizures. 2. Mild white matter changes likely reflect the sequela of chronic microvascular ischemia. Electronically Signed   By: San Morelle M.D.   On: 09/23/2015 07:26   Mr Thoracic Spine Wo Contrast  09/14/2015  CLINICAL DATA:  Initial evaluation for acute back pain EXAM: MRI THORACIC SPINE WITHOUT CONTRAST TECHNIQUE: Multiplanar, multisequence MR imaging of the thoracic spine was  performed. No intravenous contrast was administered. COMPARISON:  Prior CT from 09/13/2015. FINDINGS: Limited views of the cervical spine demonstrate multilevel degenerative spondylolysis without severe stenosis. Vertebral bodies are normally aligned with preservation of the normal thoracic kyphosis. Chronic anterior wedging deformity of the T6 vertebral  body with without significant retropulsion. There is abnormal T1 hypo intense, T2/STIR hyperintense signal intensity within the T8 vertebral body, consistent with acute compression fracture. Mild 30% of central height loss without bony retropulsion. No other acute or subacute appearing compression fracture identified. No listhesis or malalignment. Signal intensity within the vertebral body bone marrow within normal limits. Small subcentimeter lesion with the T11 vertebral body noted, indeterminate, but may reflect a small hemangioma. Signal intensity within the visualized thoracic cord is normal. Please note that the distal cord is not visualized on this exam. No acute paraspinous soft tissue abnormality. Small layering bilateral pleural effusions noted. Heavy atheromatous plaque within the visualized aorta. Intraluminal fluid noted within the visualized esophagus . T1-2: Mild disc bulge and posterior element hypertrophy. No significant stenosis. T2-3: Mild disc bulge and posterior element hypertrophy. No significant stenosis. T3-4:  Mild broad-based disc bulge without significant stenosis. T4-5:  Minimal disc bulge without stenosis. T5-6:  Negative. T6-7: Mild bony retropulsion related to the chronic T6 vertebral body fracture. Superimposed tiny central disc protrusion. Minimal cord flattening without cord signal changes. Mild posterior element hypertrophy. Very mild canal stenosis. T7-T8:  Negative. T8-9:  Mild posterior element hypertrophy without stenosis. T9-10: Mild posterior element hypertrophy without stenosis. No significant disc bulge. T10-11: Posterior  element hypertrophy without stenosis. No significant disc bulge. T11-12: Bilateral facet arthrosis without significant stenosis. No significant disc bulge. T12-L1: Seen only on sagittal projection. Disc bulge with reactive endplate changes. No significant stenosis. IMPRESSION: 1. Acute compression fracture involving the T8 vertebral body with associated mild 30% height loss without bony retropulsion. 2. No other acute abnormality within the thoracic spine. 3. Chronic compression deformity of T6 with resultant mild canal stenosis. 4. 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 Please see above report for a full description these findings. 5. Small layering bilateral pleural effusions. Electronically Signed   By: Jeannine Boga M.D.   On: 09/14/2015 22:42   Dg Chest Portable 1 View  10/02/2015  CLINICAL DATA:  Increasing shortness of breath for 2 days, hypertension, COPD, CHF, smoker EXAM: PORTABLE CHEST 1 VIEW COMPARISON:  Portable exam 1305 hours compared to 09/22/2015 FINDINGS: Enlargement of cardiac silhouette with pulmonary vascular congestion. Atherosclerotic calcification and tortuosity of thoracic aorta. Emphysematous and chronic bronchitic changes compatible with COPD. Minimal chronic interstitial prominence, stable. No definite infiltrate, pleural effusion or pneumothorax. Diffuse osseous demineralization. IMPRESSION: Enlargement of cardiac silhouette with pulmonary vascular congestion. Probable changes of COPD and question chronic interstitial lung disease. No acute abnormalities. Electronically Signed   By: Lavonia Dana M.D.   On: 10/02/2015 13:19   Dg Chest Port 1 View  09/22/2015  CLINICAL DATA:  Respiratory failure. EXAM: PORTABLE CHEST 1 VIEW COMPARISON:  09/21/2015. FINDINGS: Mediastinum hilar structures normal. Stable cardiomegaly. Interim near complete clearing of pulmonary interstitial edema. No focal infiltrate. Small left pleural effusion. No pneumothorax.  IMPRESSION: Interim near complete clearing of pulmonary interstitial edema. Small left pleural effusion. Electronically Signed   By: Marcello Moores  Register   On: 09/22/2015 07:11   Dg Chest Port 1 View  09/21/2015  CLINICAL DATA:  Acute on chronic respiratory failure with hypoxia and hypercapnia. COPD. EXAM: PORTABLE CHEST 1 VIEW COMPARISON:  Radiographs dated 09/20/2015, 09/16/2015 and 06/23/2015 and CT scan dated 09/13/2015 FINDINGS: There is persistent accentuation of the pulmonary vascularity and interstitial markings consistent with pulmonary edema superimposed on COPD. There is tortuosity and calcification of the thoracic aorta. No appreciable effusions. The osteopenia. No acute bone abnormality. IMPRESSION: Persistent interstitial edema superimposed  on COPD. Electronically Signed   By: Lorriane Shire M.D.   On: 09/21/2015 08:15   Dg Chest Port 1 View  09/20/2015  CLINICAL DATA:  Shortness of breath and COPD. EXAM: PORTABLE CHEST 1 VIEW COMPARISON:  09/16/2015 FINDINGS: Cardiomegaly and diffuse bilateral interstitial opacities again noted. Left lower lung opacities have decreased. There is no evidence of pneumothorax. No other changes identified. IMPRESSION: Decreased left lower lung opacities, otherwise unchanged appearance chest. Electronically Signed   By: Margarette Canada M.D.   On: 09/20/2015 07:34   Dg Chest Port 1 View  09/16/2015  CLINICAL DATA:  Acute onset of shortness of breath and decreased O2 saturation. Initial encounter. EXAM: PORTABLE CHEST 1 VIEW COMPARISON:  Chest radiograph and CTA of the chest performed 09/13/2015 FINDINGS: New left basilar airspace opacity is concerning for pneumonia. Increased vascular congestion is noted, and superimposed interstitial edema cannot be excluded. No definite pleural effusion or pneumothorax is seen. The cardiomediastinal silhouette is borderline enlarged. No acute osseous abnormalities are identified. IMPRESSION: New left basilar airspace opacity is concerning  for pneumonia. Increased vascular congestion noted, with borderline cardiomegaly. Superimposed interstitial edema cannot be excluded. Electronically Signed   By: Garald Balding M.D.   On: 09/16/2015 04:44   Dg Chest Portable 1 View  09/13/2015  CLINICAL DATA:  Shortness of breath, bilateral lower extremity edema EXAM: PORTABLE CHEST 1 VIEW COMPARISON:  06/23/2015 FINDINGS: Increased interstitial markings, chronic. Mild cephalization after. This appearance suggests chronic compensated CHF without frank interstitial edema. No pleural effusion or pneumothorax. The heart is top-normal in size. IMPRESSION: Chronic interstitial markings.  No frank interstitial edema. No definite pleural effusions. Electronically Signed   By: Julian Hy M.D.   On: 09/13/2015 16:34    Microbiology: Recent Results (from the past 240 hour(s))  Blood Culture (routine x 2)     Status: None   Collection Time: 10/02/15 12:28 PM  Result Value Ref Range Status   Specimen Description BLOOD RIGHT WRIST  Final   Special Requests IN PEDIATRIC BOTTLE 3CC  Final   Culture   Final    NO GROWTH 5 DAYS Performed at [facility]    Report Status 10/07/2015 FINAL  Final  Blood Culture (routine x 2)     Status: None   Collection Time: 10/02/15 12:47 PM  Result Value Ref Range Status   Specimen Description BLOOD RIGHT ANTECUBITAL  Final   Special Requests BOTTLES DRAWN AEROBIC AND ANAEROBIC 3.5 CC  Final   Culture   Final    NO GROWTH 5 DAYS Performed at [facility]    Report Status 10/07/2015 FINAL  Final  Urine culture     Status: None   Collection Time: 10/02/15  2:17 PM  Result Value Ref Range Status   Specimen Description URINE, CATHETERIZED  Final   Special Requests NONE  Final   Culture   Final    NO GROWTH 2 DAYS Performed at [facility]    Report Status 10/04/2015 FINAL  Final     Labs: Basic Metabolic Panel:  Recent Labs Lab 10/03/15 0326 10/04/15 0329 10/05/15 0310  NA 139  136 134*  K 3.0* 3.5 3.5  CL 71* 71* 78*  CO2 >50* >50* 44*  GLUCOSE 114* 104* 97  BUN 11 17 17   CREATININE 0.47 0.47 0.45  CALCIUM 7.4* 7.9* 8.1*   Liver Function Tests: No results for input(s): AST, ALT, ALKPHOS, BILITOT, PROT, ALBUMIN in the last 168 hours. No results for input(s):  LIPASE, AMYLASE in the last 168 hours. No results for input(s): AMMONIA in the last 168 hours. CBC:  Recent Labs Lab 10/03/15 0326  WBC 11.5*  HGB 11.6*  HCT 35.7*  MCV 96.2  PLT 347   Cardiac Enzymes: No results for input(s): CKTOTAL, CKMB, CKMBINDEX, TROPONINI in the last 168 hours. BNP: BNP (last 3 results)  Recent Labs  09/14/15 1700 09/16/15 0433 10/02/15 1237  BNP 281.8* 629.6* 616.0*    ProBNP (last 3 results) No results for input(s): PROBNP in the last 8760 hours.  CBG: No results for input(s): GLUCAP in the last 168 hours.     SignedDebbe Odea, MD [facility] 10/09/2015, 6:09 PM

## 2015-10-12 ENCOUNTER — Telehealth: Payer: Self-pay | Admitting: Family Medicine

## 2015-10-12 ENCOUNTER — Telehealth: Payer: Self-pay | Admitting: Internal Medicine

## 2015-10-12 NOTE — Telephone Encounter (Signed)
MR called and stated that he can't do this message due to being night float. Will send to DOD.  MW - please advise. Thanks.

## 2015-10-12 NOTE — Telephone Encounter (Signed)
Hospitalist prescribed Keppra 750 mg #120 - 1 refill during latest admission and is instructed to take 2 tablets twice daily. Patient scheduled with Neuro this Friday, 10-18-15. Please advise.

## 2015-10-12 NOTE — Telephone Encounter (Signed)
Spoke with Kenney Houseman and advised that our office did not prescribe Keppra. She states pt was prescribed this during her hospital stay. She will call PCP for further advice. Nothing further needed.

## 2015-10-12 NOTE — Telephone Encounter (Signed)
Jill White would like for you to know that pt and granddaughter Jill White) refuse an appointment to come in to be evaluated.  Pt has been in the hospital end of March to mid April and while in the hospital pt had one seizure and while on Keppra 1500 mg bid pt has 8 side effects.  Pt would like to know if the Keppra dosage could be reduced.

## 2015-10-12 NOTE — Telephone Encounter (Signed)
Reduce the Keppra to taking a single tablet BID (instead of 2 tabs)

## 2015-10-12 NOTE — Telephone Encounter (Signed)
It does not look like it was prescribed by [place] - would call the doctor on the bottle for advice

## 2015-10-12 NOTE — Telephone Encounter (Signed)
Spoke to [place] with Hospice. Pt is currently taking Keppra 750mg  2 tablets BID. She is having side effects such as dizziness, panic attacks, headache and loss of appetite. Pt started this medication on 10/08/15. Kenney Houseman needs to know what do from here? Decrease dosage?  MR- please advise. Thanks.

## 2015-10-12 NOTE — Telephone Encounter (Signed)
[facility] - Client [place]  Patient Name: Jill White  DOB: Jul 16, 1940    Initial Comment Caller states grandmother was d/c 04/10/ in 17 days and back in 14th, and d/c 20th last week; had seizure in hospital; was put on seizure meds in first visit; having 7 of listed side effects: dizziness and feels faint; imbalance, panic attacks, depression, fatigue, loss of appetite, restlessness, insomnia; c/o being hot, esp w/dizziness; first neurology appt is Jill White so they cannot advise. no sx now, sleeping;    Nurse Assessment  Nurse: Jill Emery, RN, Jill White Date/Time Jill White Time): 10/12/2015 10:07:55 AM  Confirm and document reason for call. If symptomatic, describe symptoms. You must click the next button to save text entered. ---Jill White was placed on seizure medications d./t having seizures while hospitalized in March. Keppra and another medication - had deliruium while in hospital -- now, having 7 to 8 side effects of the keppra 1500mg  bid sees Neuro on the 28th will not advise untill seen worried about the medication  Has the patient traveled out of the country within the last 30 days? ---No  Does the patient have any new or worsening symptoms? ---Yes  Will a triage be completed? ---Yes  Related visit to physician within the last 2 weeks? ---No  Does the PT have any chronic conditions? (i.e. diabetes, asthma, etc.) ---Yes  List chronic conditions. ---Sezures new onset  Is this a behavioral health or substance abuse call? ---No     Guidelines    Guideline Title Affirmed Question Affirmed Notes  Dizziness - Lightheadedness [1] MODERATE dizziness (e.g., interferes with normal activities) AND [2] has NOT been evaluated by physician for this (Exception: dizziness caused by heat exposure, sudden standing, or poor fluid intake)    Final Disposition User   See Physician within 24 Hours [name], RN, Jill White    Comments  NOTES; Jill White  not able to come in d/t health issues but wants MD to look at the dose of keppra 1500mg  po bid and see if they can safely lower dose since she is having most side effects of the medication -- generic depakote -- 250mg  po BID seems to be tolerating the depakote ok--- can MD look at the medications and see if it can be cut back. thanks please call back Jill White  NOTE; CALLED THE OFFICE AND REFUSED APPT been hospital end March to mid April 1 seizure while in hospital (thought it was due to elevate CO2 levels) has COPD put on Keppra 1500mg  po BID and Depakote 250mg  po BID--- has not had any more seizures at this time Keppra is causing her lots of side effects wants MD to adjust the medication as Neuro MD (sees Friday) will not do it until seen   Referrals  REFERRED TO PCP OFFICE   Disagree/Comply: Comply

## 2015-10-12 NOTE — Telephone Encounter (Signed)
See previous note, this is a duplicate.

## 2015-10-12 NOTE — Telephone Encounter (Signed)
I spoke with Jill White and gave below advice, also updated this change in pt's medication list.

## 2015-10-13 ENCOUNTER — Telehealth: Payer: Self-pay | Admitting: Internal Medicine

## 2015-10-13 NOTE — Telephone Encounter (Signed)
i am fine with that xanax order

## 2015-10-13 NOTE — Telephone Encounter (Signed)
XANAX 0.5 bid prn anxiety Please conform with MR that this is okay

## 2015-10-13 NOTE — Telephone Encounter (Signed)
I spoke with [place] and notified of RA's response  Will forward msg to MR for recs  Please advise thanks!

## 2015-10-13 NOTE — Telephone Encounter (Signed)
Called spoke with [facility] w/ Hospice who reported that pt has been having increased panic attacks since being admitted to home hospice.  Pt only has Clonazepam 0.5mg  QHS ordered.  Granddaughter is asking if pt may have something additional.  MR is night float all week TP is DOD but has never seen the patient Dr Elsworth Soho, you saw pt last on 2.14.17 for acute visit.  Please advise if pt may have additional meds for panic attacks.  Thank you.

## 2015-10-14 MED ORDER — ALPRAZOLAM 0.25 MG PO TABS: 0.2500 mg | ORAL_TABLET | Freq: Two times a day (BID) | ORAL | Status: AC | PRN

## 2015-10-14 NOTE — Telephone Encounter (Signed)
Called spoke with [place]. Informed her of MR's recs. Verified pharmacy as [facility] [address] She voiced understanding and had no further questions.  Called [facility] and spoke with Ugi. I phoned in rx. Nothing further needed.

## 2015-10-15 ENCOUNTER — Inpatient Hospital Stay: Payer: Medicare Other | Admitting: Acute Care

## 2015-10-16 ENCOUNTER — Ambulatory Visit: Payer: Medicare Other | Admitting: Neurology

## 2015-10-16 ENCOUNTER — Telehealth: Payer: Self-pay | Admitting: Internal Medicine

## 2015-10-19 ENCOUNTER — Ambulatory Visit: Payer: Medicare Other | Admitting: Neurology

## 2015-10-19 NOTE — Telephone Encounter (Signed)
Spoke with Tonya from Hospice. Pt is actively dying and needs to be transferred to [facility]. I have given her the verbal to have this done. Nothing further was needed.

## 2015-10-19 DEATH — deceased

## 2015-10-21 ENCOUNTER — Ambulatory Visit: Payer: Medicare Other | Admitting: Cardiovascular Disease

## 2015-10-22 ENCOUNTER — Other Ambulatory Visit: Payer: Self-pay | Admitting: Family Medicine

## 2015-11-12 ENCOUNTER — Ambulatory Visit: Payer: Medicare Other | Admitting: Internal Medicine

## 2016-02-11 IMAGING — CR DG CHEST 2V
2 series · 2 of 2 positions shown · non-contrast
Comparison: 12/10/2013.

CLINICAL DATA: Short of breath. COPD. History of smoking and
hypertension.

EXAM:
CHEST  2 VIEW

[view not recorded (1 of 2)]
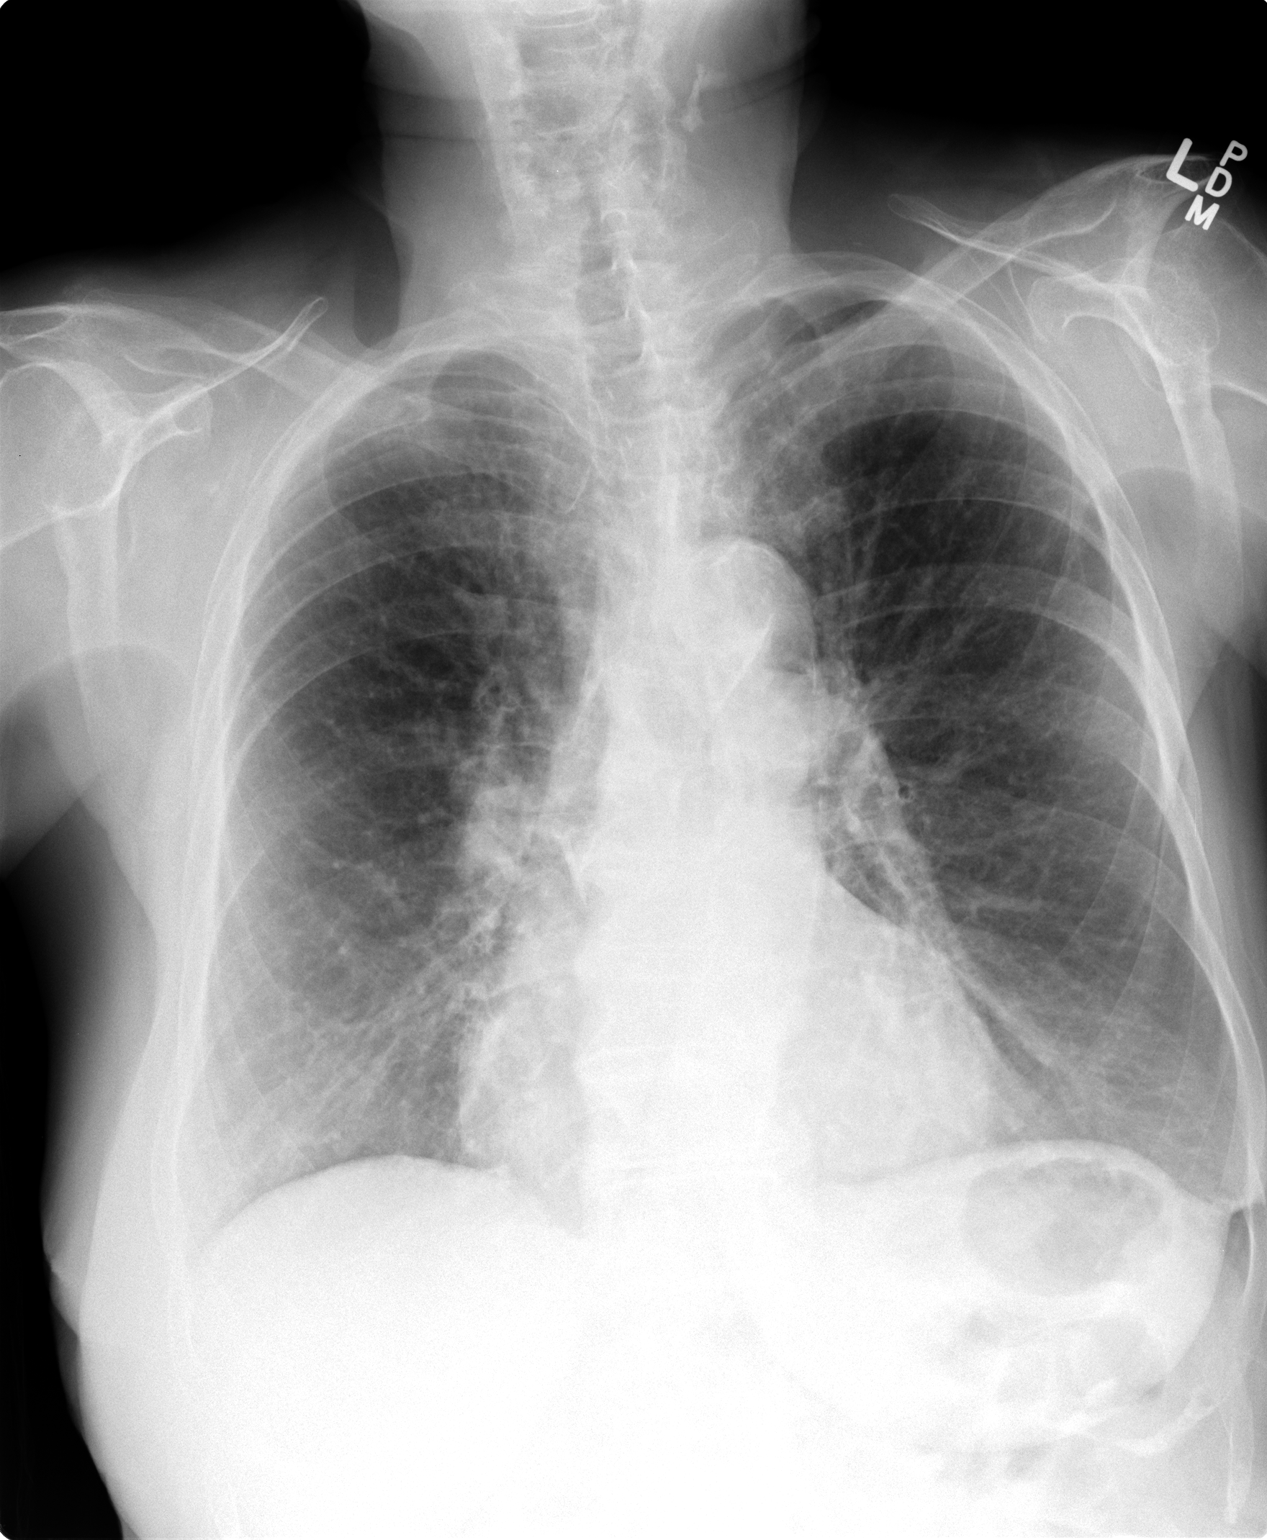

[view not recorded (2 of 2)]
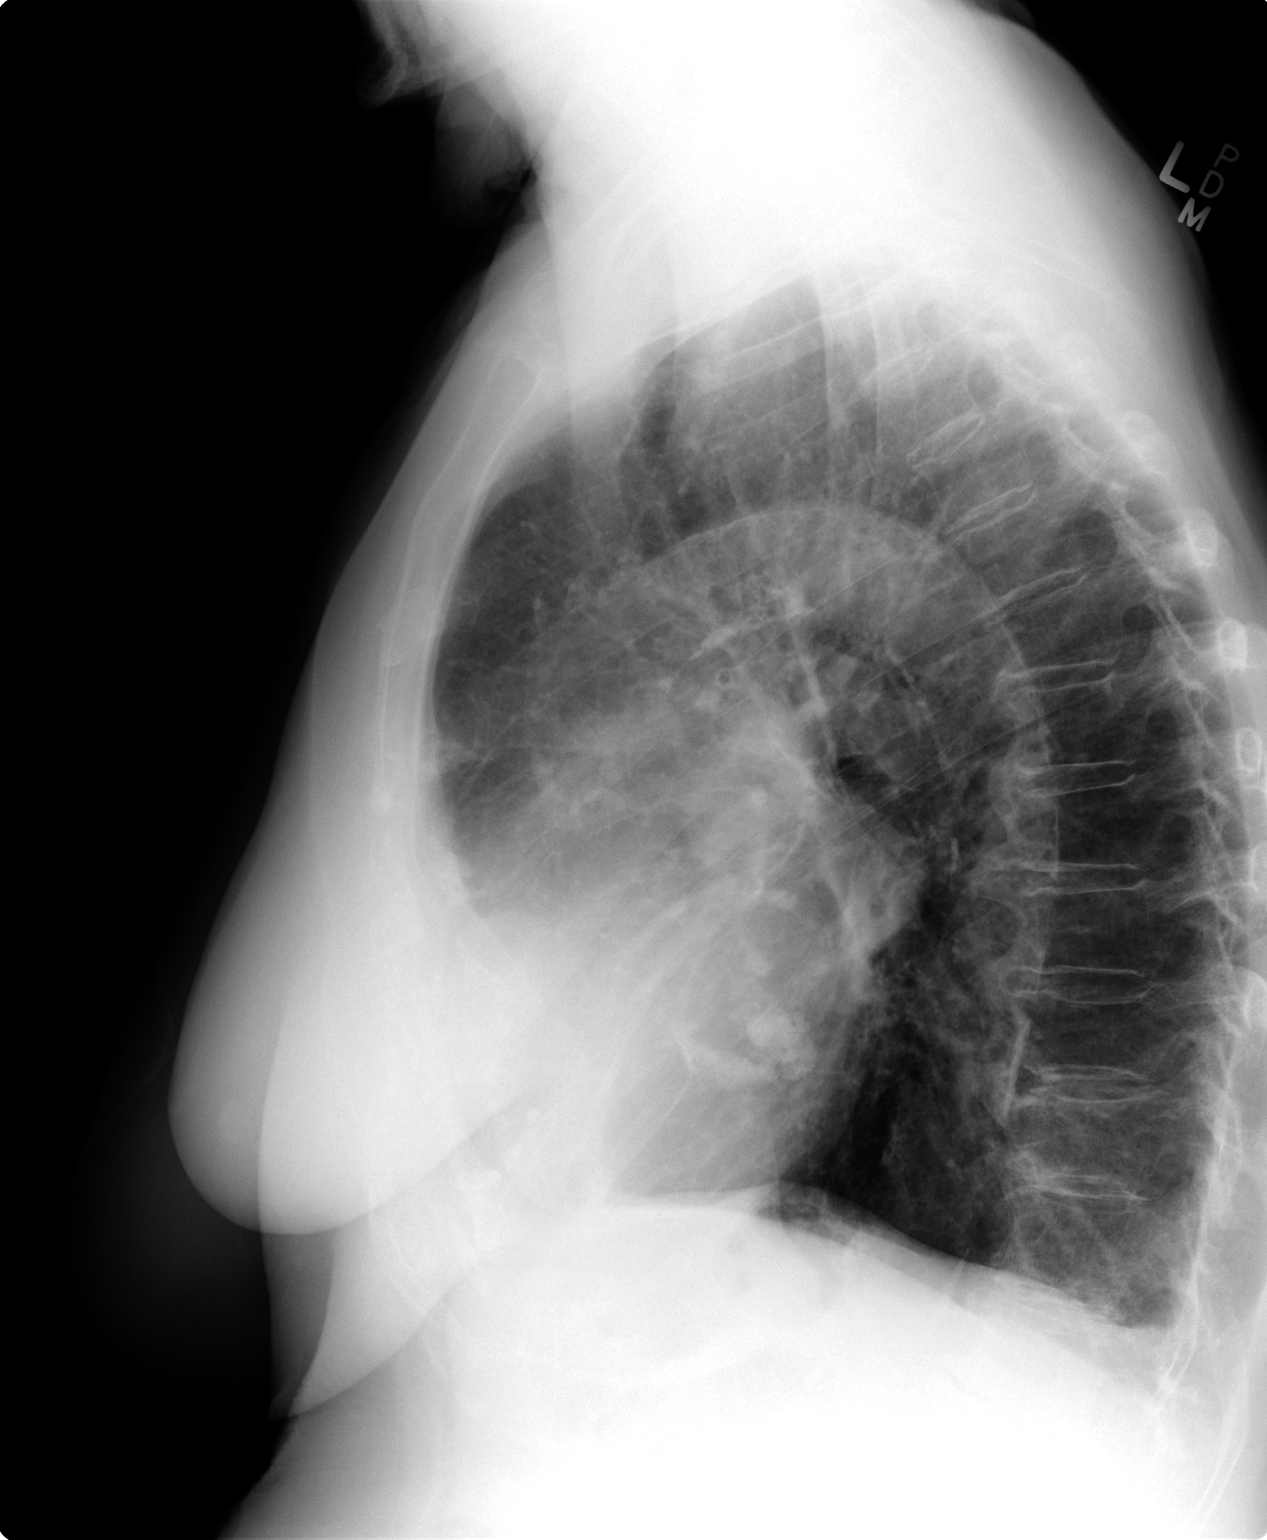

[2 of 2 positions shown; findings below may reference images not displayed]

FINDINGS: Cardiac silhouette is normal in size. Aorta is mildly uncoiled. No
mediastinal or hilar masses or evidence of adenopathy.

Lungs are hyperexpanded. Reticular opacity in the left lateral lung
base is stable consistent with scarring. No lung consolidation or
edema. No pleural effusion or pneumothorax. No discrete lung mass or
nodule.

Bony thorax is demineralized but grossly intact.
IMPRESSION: 1. No acute cardiopulmonary disease.
2. Findings consistent with COPD.  No change from the prior exam.

## 2017-06-21 IMAGING — DX DG CHEST 1V PORT
1 series · 1 of 1 positions shown · non-contrast
Comparison: 06/23/2015

CLINICAL DATA: Shortness of breath, bilateral lower extremity edema

EXAM:
PORTABLE CHEST 1 VIEW

[chest ap]
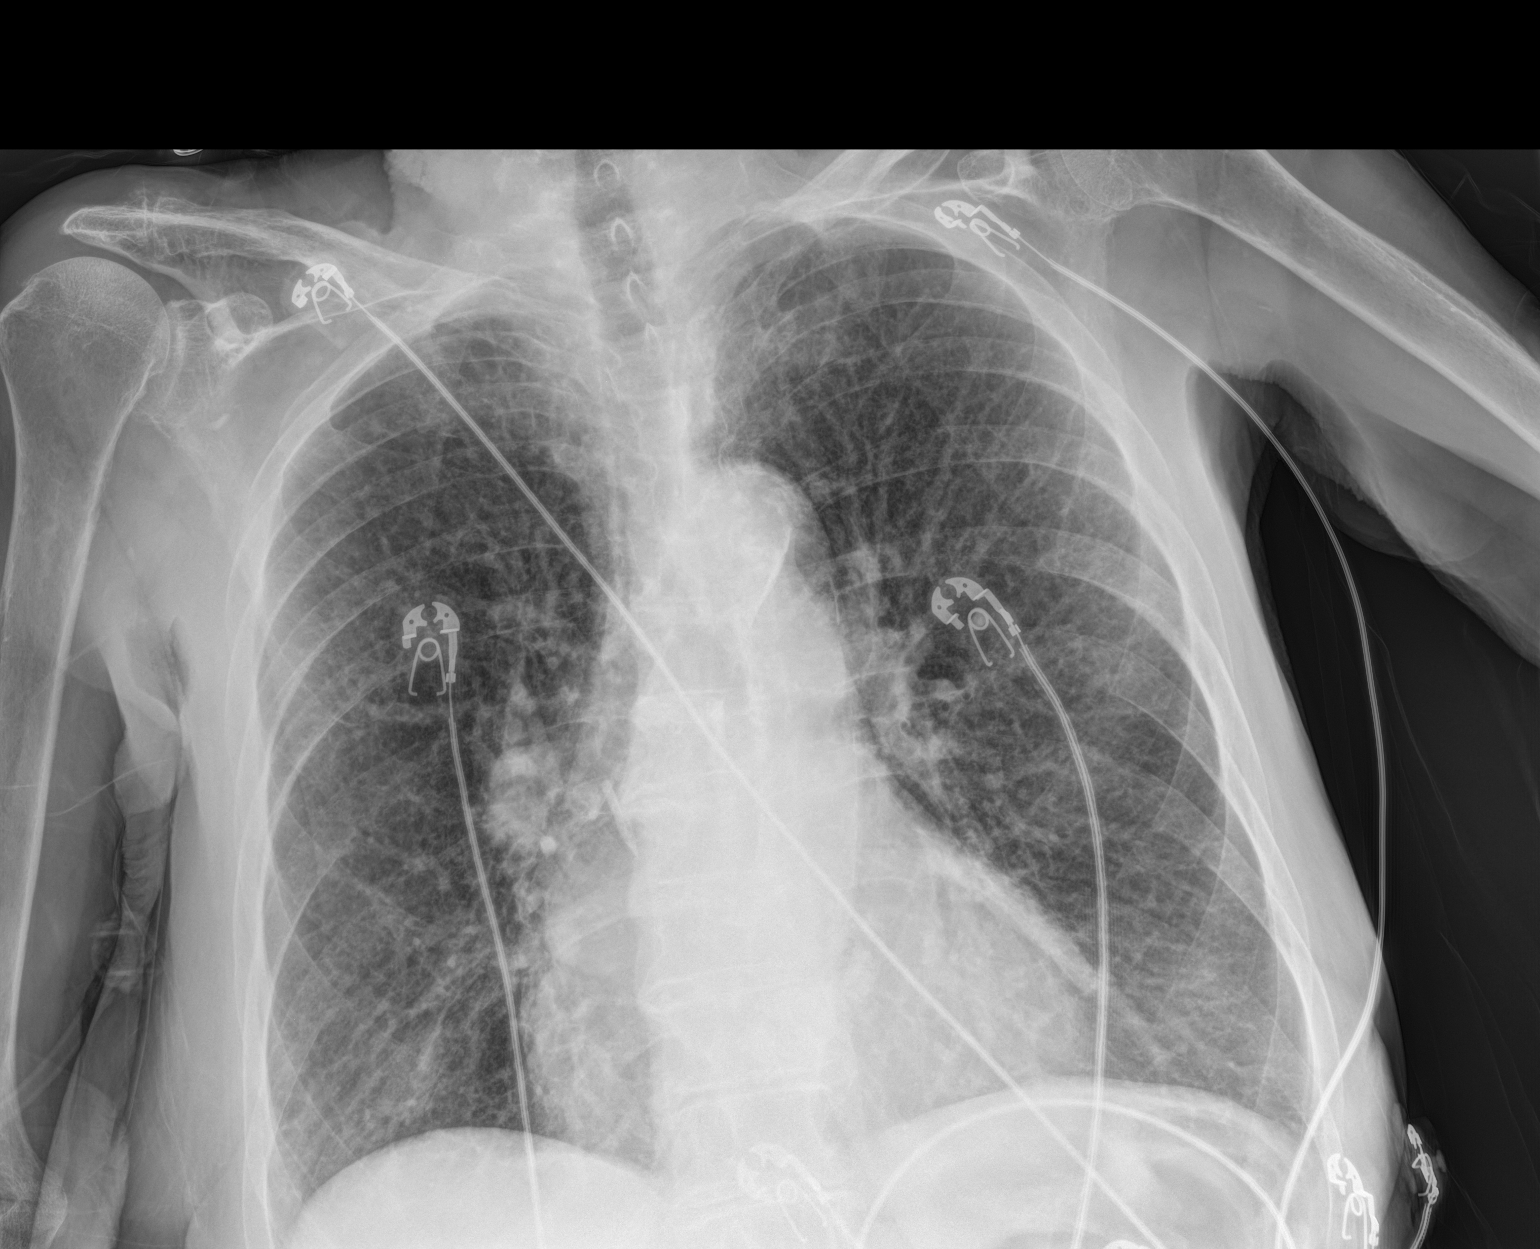

[1 of 1 positions shown; findings below may reference images not displayed]

FINDINGS: Increased interstitial markings, chronic. Mild cephalization after.
This appearance suggests chronic compensated CHF without frank
interstitial edema.

No pleural effusion or pneumothorax.

The heart is top-normal in size.
IMPRESSION: Chronic interstitial markings.  No frank interstitial edema.

No definite pleural effusions.

## 2017-06-21 IMAGING — CT CT ANGIO CHEST
2 of 6 series · 19 of 36 positions shown · IV contrast (OMNIPAQUE)
Comparison: Chest radiograph dated 09/13/2015

CLINICAL DATA: 74-year-old female with shortness of breath and
upper back pain.

EXAM:
CT ANGIOGRAPHY CHEST WITH CONTRAST
TECHNIQUE: Multidetector CT imaging of the chest was performed using the
standard protocol during bolus administration of intravenous
contrast. Multiplanar CT image reconstructions and MIPs were
obtained to evaluate the vascular anatomy.
CONTRAST:  100mL OMNIPAQUE IOHEXOL 350 MG/ML SOLN

[Series 6: pe thins @ 1mm · axial · 0.74mm/px · z∈[+1104,+1356]mm · 18 of 281 slices shown]
[im 15/281  lung]
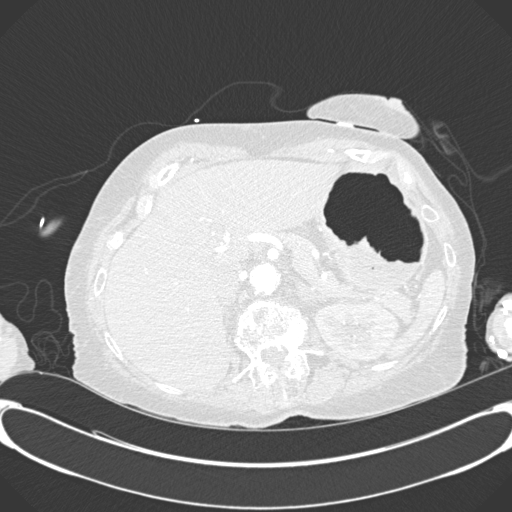
[im 29/281  mediastinal]
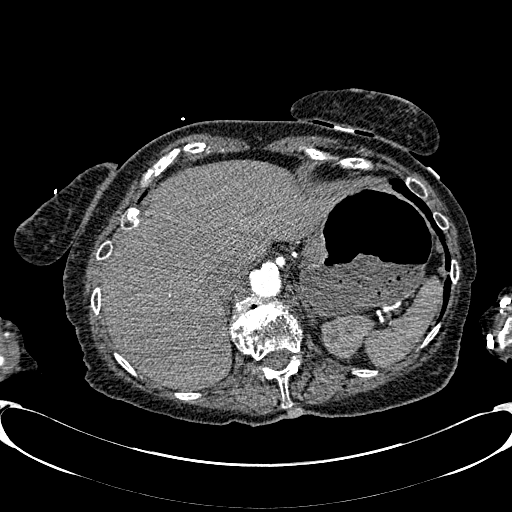
[im 43/281  lung]
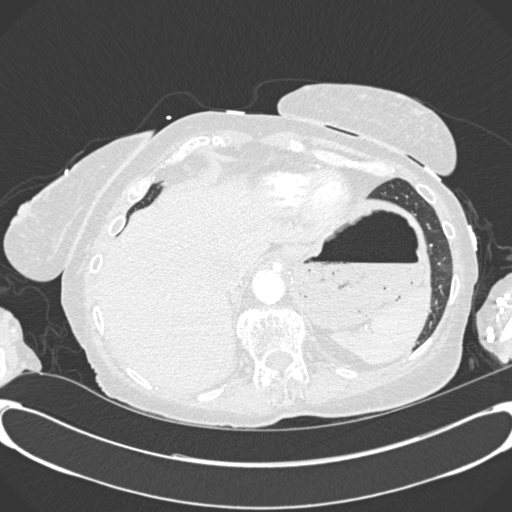
[im 57/281  mediastinal]
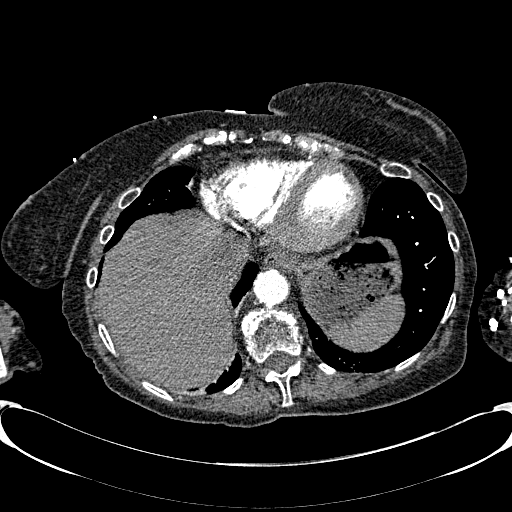
[im 71/281  lung]
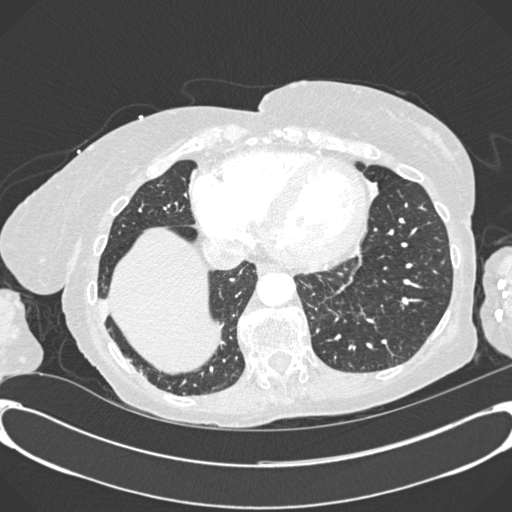
[im 85/281  mediastinal]
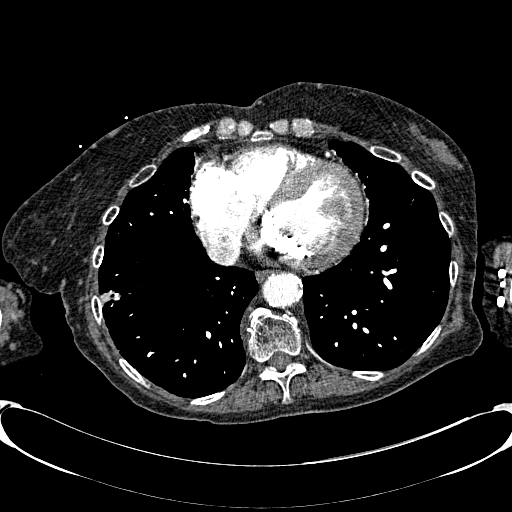
[im 99/281  lung]
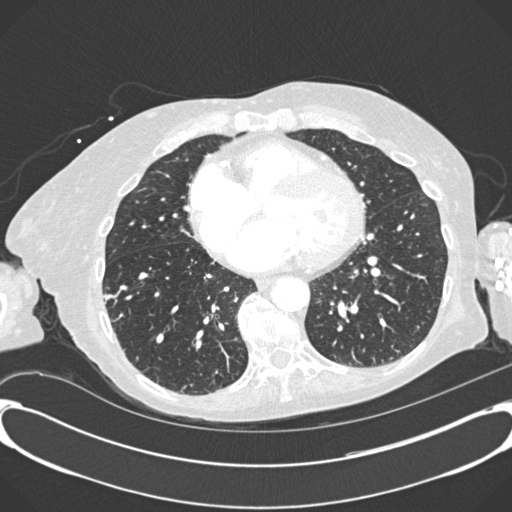
[im 113/281  mediastinal]
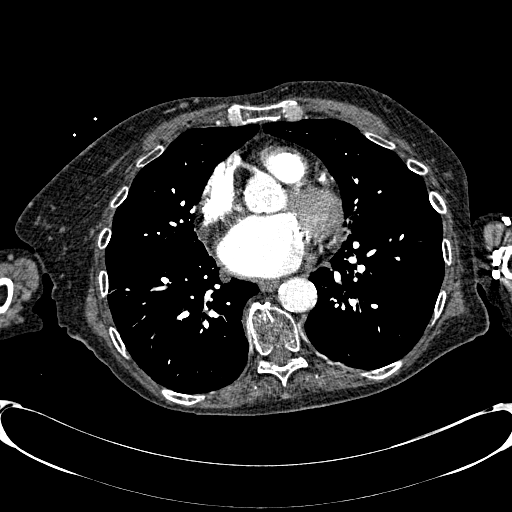
[im 127/281  lung]
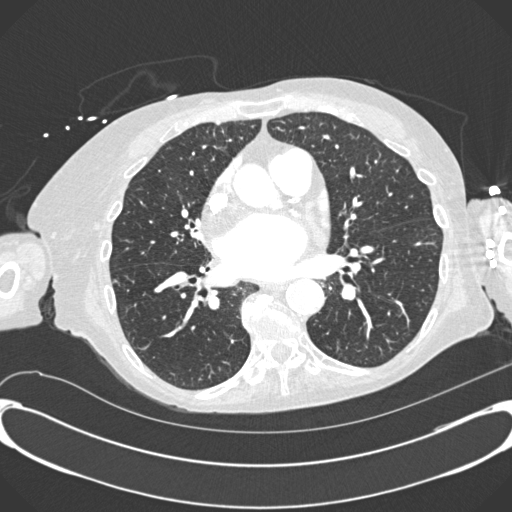
[im 155/281  mediastinal]
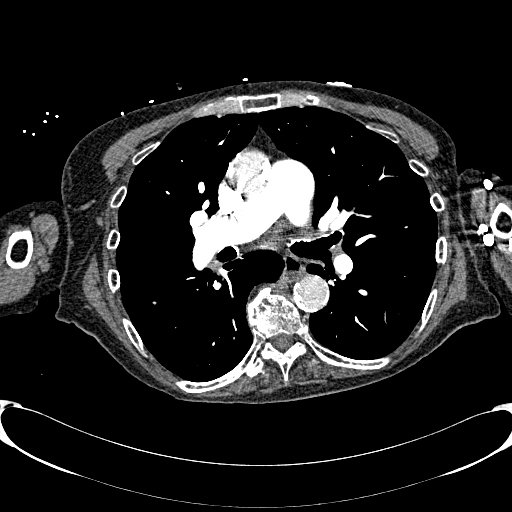
[im 169/281  lung]
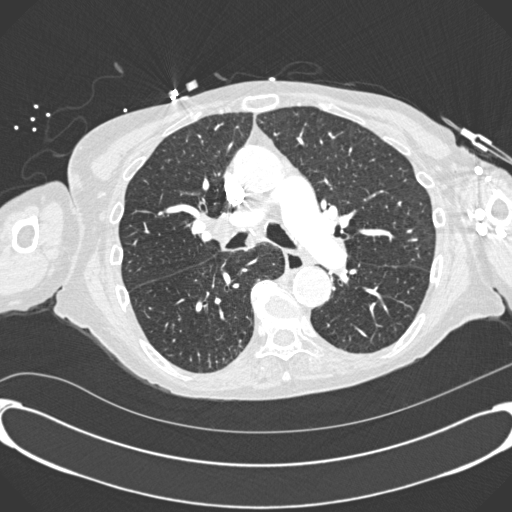
[im 183/281  mediastinal]
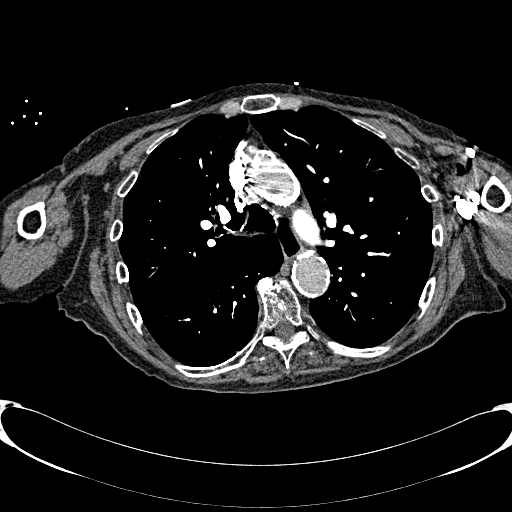
[im 197/281  lung]
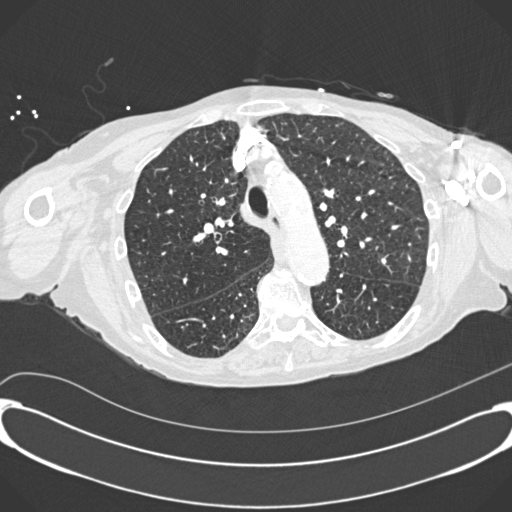
[im 211/281  mediastinal]
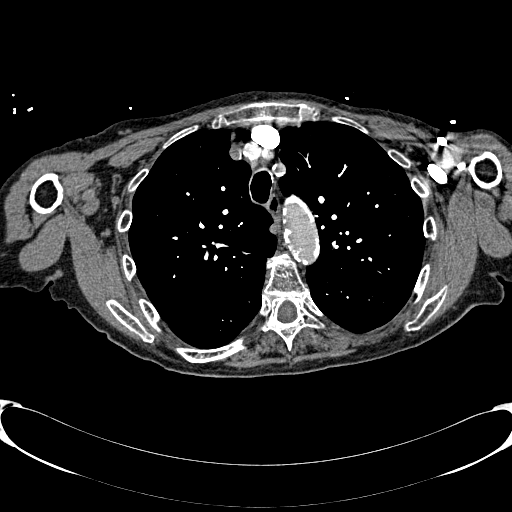
[im 225/281  lung]
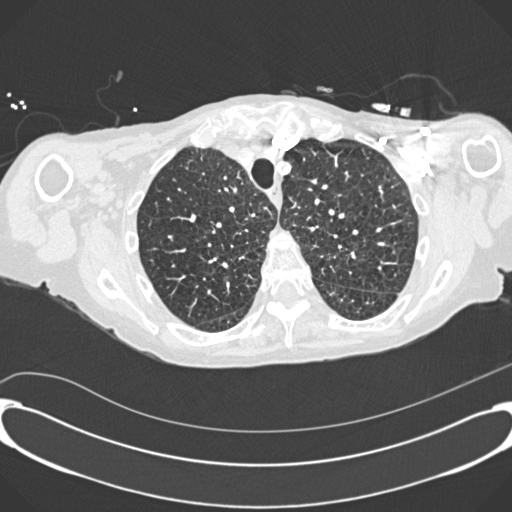
[im 239/281  mediastinal]
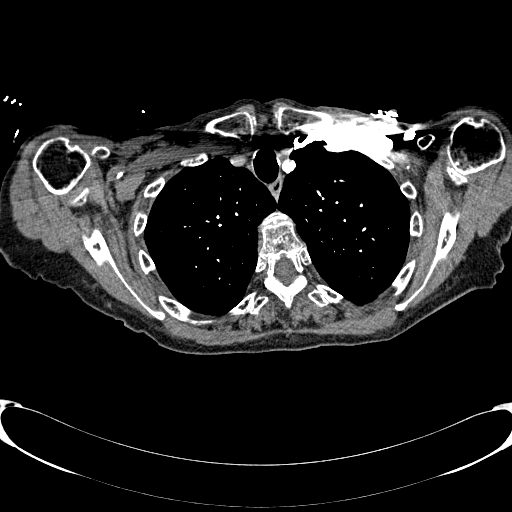
[im 253/281  lung]
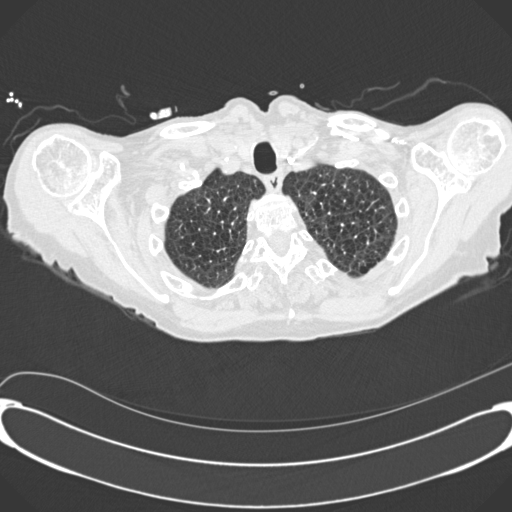
[im 267/281  mediastinal]
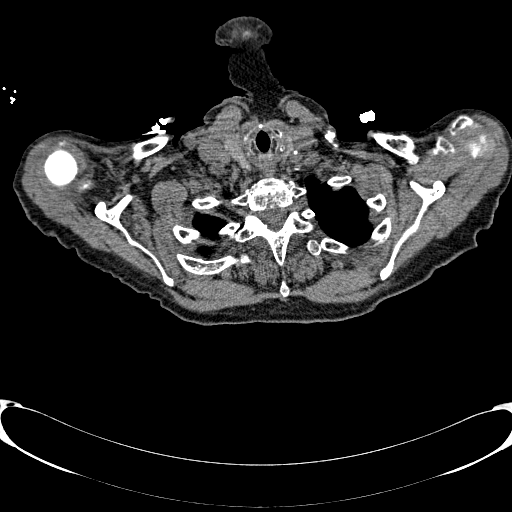

[Series 602: <mpr thick range> · coronal · 0.74mm/px · 1 of 118 slices shown]
[im 59/118  mediastinal]
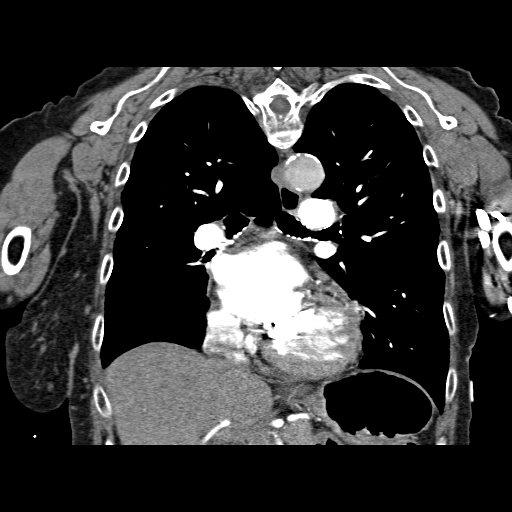

[19 of 36 positions shown; findings below may reference images not displayed]

FINDINGS: There is emphysematous changes of the lungs. No focal consolidation,
pleural effusion, or pneumothorax. The central airways are patent.

There is atherosclerotic calcification of the thoracic aorta. No CT
evidence of pulmonary embolism. There is no cardiomegaly or
pericardial effusion. There is coronary vascular calcification.
There is no hilar or mediastinal adenopathy.

The esophagus and the thyroid gland appear unremarkable.

There is no axillary adenopathy. The chest wall soft tissues appear
unremarkable. There is osteopenia with degenerative changes of the
spine. C6 compression fracture with anterior wedging, likely old.
There is T8 compression fracture with mild sclerotic changes, age
indeterminate, likely old. Clinical correlation is recommended.

The visualized upper abdomen appears unremarkable.

Review of the MIP images confirms the above findings.
IMPRESSION: No CT evidence of pulmonary embolism.

Osteopenia with multilevel compression fractures of the thoracic
spine, age indeterminate, likely old. Clinical correlation is
recommended.
